# Patient Record
Sex: Female | Born: 1945
Health system: Southern US, Community
[De-identification: ages and names within clinical notes are randomized; demographics above are authoritative.]

## PROBLEM LIST (undated history)

## (undated) DIAGNOSIS — K219 Gastro-esophageal reflux disease without esophagitis: Secondary | ICD-10-CM

## (undated) DIAGNOSIS — I10 Essential (primary) hypertension: Secondary | ICD-10-CM

## (undated) DIAGNOSIS — L0390 Cellulitis, unspecified: Secondary | ICD-10-CM

## (undated) DIAGNOSIS — E785 Hyperlipidemia, unspecified: Secondary | ICD-10-CM

## (undated) DIAGNOSIS — I2510 Atherosclerotic heart disease of native coronary artery without angina pectoris: Secondary | ICD-10-CM

## (undated) DIAGNOSIS — F1910 Other psychoactive substance abuse, uncomplicated: Secondary | ICD-10-CM

## (undated) DIAGNOSIS — A310 Pulmonary mycobacterial infection: Secondary | ICD-10-CM

## (undated) DIAGNOSIS — E039 Hypothyroidism, unspecified: Secondary | ICD-10-CM

## (undated) DIAGNOSIS — M792 Neuralgia and neuritis, unspecified: Secondary | ICD-10-CM

## (undated) DIAGNOSIS — J449 Chronic obstructive pulmonary disease, unspecified: Secondary | ICD-10-CM

## (undated) DIAGNOSIS — E119 Type 2 diabetes mellitus without complications: Secondary | ICD-10-CM

## (undated) DIAGNOSIS — K5792 Diverticulitis of intestine, part unspecified, without perforation or abscess without bleeding: Secondary | ICD-10-CM

## (undated) DIAGNOSIS — Z8619 Personal history of other infectious and parasitic diseases: Secondary | ICD-10-CM

## (undated) HISTORY — DX: Essential (primary) hypertension: I10

## (undated) HISTORY — DX: Cellulitis, unspecified: L03.90

## (undated) HISTORY — DX: Pulmonary mycobacterial infection: A31.0

## (undated) HISTORY — DX: Atherosclerotic heart disease of native coronary artery without angina pectoris: I25.10

## (undated) HISTORY — DX: Type 2 diabetes mellitus without complications: E11.9

## (undated) HISTORY — DX: Chronic obstructive pulmonary disease, unspecified: J44.9

## (undated) HISTORY — PX: COLON SURGERY: SHX602

## (undated) HISTORY — DX: Hyperlipidemia, unspecified: E78.5

## (undated) HISTORY — PX: HAND SURGERY: SHX662

## (undated) HISTORY — DX: Personal history of other infectious and parasitic diseases: Z86.19

## (undated) HISTORY — DX: Other psychoactive substance abuse, uncomplicated: F19.10

## (undated) HISTORY — DX: Gastro-esophageal reflux disease without esophagitis: K21.9

## (undated) HISTORY — DX: Diverticulitis of intestine, part unspecified, without perforation or abscess without bleeding: K57.92

---

## 1998-10-16 ENCOUNTER — Encounter: Admission: RE | Admit: 1998-10-16 | Discharge: 1999-01-14 | Payer: Self-pay | Admitting: Family Medicine

## 1998-11-18 ENCOUNTER — Encounter: Payer: Self-pay | Admitting: Gastroenterology

## 1998-11-18 ENCOUNTER — Ambulatory Visit (HOSPITAL_COMMUNITY): Admission: RE | Admit: 1998-11-18 | Discharge: 1998-11-18 | Payer: Self-pay | Admitting: Gastroenterology

## 2000-03-18 ENCOUNTER — Encounter: Admission: RE | Admit: 2000-03-18 | Discharge: 2000-03-18 | Payer: Self-pay | Admitting: Family Medicine

## 2000-03-18 ENCOUNTER — Encounter: Payer: Self-pay | Admitting: Family Medicine

## 2000-04-13 ENCOUNTER — Encounter: Payer: Self-pay | Admitting: Internal Medicine

## 2001-12-14 ENCOUNTER — Encounter: Payer: Self-pay | Admitting: Thoracic Surgery (Cardiothoracic Vascular Surgery)

## 2001-12-15 ENCOUNTER — Inpatient Hospital Stay (HOSPITAL_COMMUNITY): Admission: AD | Admit: 2001-12-15 | Discharge: 2001-12-23 | Payer: Self-pay | Admitting: Cardiology

## 2001-12-15 ENCOUNTER — Encounter: Payer: Self-pay | Admitting: Thoracic Surgery (Cardiothoracic Vascular Surgery)

## 2001-12-16 ENCOUNTER — Encounter: Payer: Self-pay | Admitting: Thoracic Surgery (Cardiothoracic Vascular Surgery)

## 2001-12-17 ENCOUNTER — Encounter: Payer: Self-pay | Admitting: Thoracic Surgery (Cardiothoracic Vascular Surgery)

## 2001-12-18 ENCOUNTER — Encounter: Payer: Self-pay | Admitting: Thoracic Surgery (Cardiothoracic Vascular Surgery)

## 2001-12-19 ENCOUNTER — Encounter: Payer: Self-pay | Admitting: Thoracic Surgery (Cardiothoracic Vascular Surgery)

## 2001-12-22 ENCOUNTER — Encounter: Payer: Self-pay | Admitting: Thoracic Surgery (Cardiothoracic Vascular Surgery)

## 2002-01-25 HISTORY — PX: CORONARY ARTERY BYPASS GRAFT: SHX141

## 2003-12-23 ENCOUNTER — Ambulatory Visit: Payer: Self-pay | Admitting: Cardiology

## 2004-07-31 ENCOUNTER — Ambulatory Visit: Payer: Self-pay | Admitting: Cardiology

## 2004-12-25 ENCOUNTER — Ambulatory Visit: Payer: Self-pay | Admitting: Cardiology

## 2005-04-28 ENCOUNTER — Ambulatory Visit: Payer: Self-pay | Admitting: Internal Medicine

## 2005-12-28 ENCOUNTER — Ambulatory Visit: Payer: Self-pay | Admitting: Cardiology

## 2006-01-06 ENCOUNTER — Ambulatory Visit: Payer: Self-pay

## 2006-02-04 ENCOUNTER — Ambulatory Visit: Payer: Self-pay | Admitting: Internal Medicine

## 2006-02-11 ENCOUNTER — Ambulatory Visit: Payer: Self-pay | Admitting: Internal Medicine

## 2006-03-18 ENCOUNTER — Ambulatory Visit: Payer: Self-pay | Admitting: Internal Medicine

## 2006-11-16 ENCOUNTER — Ambulatory Visit: Payer: Self-pay | Admitting: Critical Care Medicine

## 2007-01-12 ENCOUNTER — Ambulatory Visit: Payer: Self-pay | Admitting: Cardiology

## 2007-01-31 ENCOUNTER — Ambulatory Visit: Payer: Self-pay

## 2007-07-03 ENCOUNTER — Ambulatory Visit: Payer: Self-pay | Admitting: Internal Medicine

## 2007-07-03 DIAGNOSIS — R059 Cough, unspecified: Secondary | ICD-10-CM | POA: Insufficient documentation

## 2007-07-03 DIAGNOSIS — I2510 Atherosclerotic heart disease of native coronary artery without angina pectoris: Secondary | ICD-10-CM

## 2007-07-03 DIAGNOSIS — E785 Hyperlipidemia, unspecified: Secondary | ICD-10-CM

## 2007-07-03 DIAGNOSIS — I10 Essential (primary) hypertension: Secondary | ICD-10-CM | POA: Insufficient documentation

## 2007-07-03 DIAGNOSIS — J479 Bronchiectasis, uncomplicated: Secondary | ICD-10-CM | POA: Insufficient documentation

## 2007-07-03 DIAGNOSIS — R05 Cough: Secondary | ICD-10-CM

## 2007-07-03 DIAGNOSIS — Z8679 Personal history of other diseases of the circulatory system: Secondary | ICD-10-CM | POA: Insufficient documentation

## 2007-07-03 DIAGNOSIS — Z8719 Personal history of other diseases of the digestive system: Secondary | ICD-10-CM

## 2007-07-06 ENCOUNTER — Telehealth (INDEPENDENT_AMBULATORY_CARE_PROVIDER_SITE_OTHER): Payer: Self-pay | Admitting: *Deleted

## 2007-07-18 ENCOUNTER — Encounter: Payer: Self-pay | Admitting: Internal Medicine

## 2007-07-27 ENCOUNTER — Ambulatory Visit: Payer: Self-pay | Admitting: Internal Medicine

## 2007-07-27 LAB — CONVERTED CEMR LAB
BUN: 13 mg/dL (ref 6–23)
Basophils Absolute: 0 10*3/uL (ref 0.0–0.1)
Basophils Relative: 0.1 % (ref 0.0–1.0)
CO2: 32 meq/L (ref 19–32)
Calcium: 9.2 mg/dL (ref 8.4–10.5)
Chloride: 101 meq/L (ref 96–112)
Creatinine, Ser: 1.1 mg/dL (ref 0.4–1.2)
Eosinophils Absolute: 0.1 10*3/uL (ref 0.0–0.7)
Eosinophils Relative: 1.4 % (ref 0.0–5.0)
GFR calc Af Amer: 64 mL/min
GFR calc non Af Amer: 53 mL/min
Glucose, Bld: 123 mg/dL — ABNORMAL HIGH (ref 70–99)
HCT: 39.9 % (ref 36.0–46.0)
Hemoglobin: 13.7 g/dL (ref 12.0–15.0)
Lymphocytes Relative: 16.8 % (ref 12.0–46.0)
MCHC: 34.4 g/dL (ref 30.0–36.0)
MCV: 94 fL (ref 78.0–100.0)
Monocytes Absolute: 0.5 10*3/uL (ref 0.1–1.0)
Monocytes Relative: 7.2 % (ref 3.0–12.0)
Neutro Abs: 5.2 10*3/uL (ref 1.4–7.7)
Neutrophils Relative %: 74.5 % (ref 43.0–77.0)
Platelets: 335 10*3/uL (ref 150–400)
Potassium: 3.6 meq/L (ref 3.5–5.1)
RBC: 4.24 M/uL (ref 3.87–5.11)
RDW: 11.5 % (ref 11.5–14.6)
Sed Rate: 41 mm/hr — ABNORMAL HIGH (ref 0–22)
Sodium: 141 meq/L (ref 135–145)
WBC: 7 10*3/uL (ref 4.5–10.5)

## 2007-08-03 ENCOUNTER — Ambulatory Visit: Payer: Self-pay | Admitting: Cardiology

## 2007-08-08 ENCOUNTER — Ambulatory Visit: Payer: Self-pay | Admitting: Internal Medicine

## 2007-08-08 ENCOUNTER — Encounter: Payer: Self-pay | Admitting: Internal Medicine

## 2007-08-08 ENCOUNTER — Ambulatory Visit: Admission: RE | Admit: 2007-08-08 | Discharge: 2007-08-08 | Payer: Self-pay | Admitting: Internal Medicine

## 2007-08-10 ENCOUNTER — Telehealth: Payer: Self-pay | Admitting: Internal Medicine

## 2007-08-14 ENCOUNTER — Telehealth (INDEPENDENT_AMBULATORY_CARE_PROVIDER_SITE_OTHER): Payer: Self-pay | Admitting: *Deleted

## 2007-08-29 ENCOUNTER — Ambulatory Visit: Payer: Self-pay | Admitting: Internal Medicine

## 2007-09-01 ENCOUNTER — Telehealth (INDEPENDENT_AMBULATORY_CARE_PROVIDER_SITE_OTHER): Payer: Self-pay | Admitting: *Deleted

## 2007-09-06 ENCOUNTER — Encounter: Payer: Self-pay | Admitting: Internal Medicine

## 2007-09-07 ENCOUNTER — Encounter: Payer: Self-pay | Admitting: Internal Medicine

## 2007-10-19 ENCOUNTER — Ambulatory Visit: Payer: Self-pay | Admitting: Internal Medicine

## 2007-10-20 LAB — CONVERTED CEMR LAB
AST: 35 units/L (ref 0–37)
Alkaline Phosphatase: 88 units/L (ref 39–117)
BUN: 20 mg/dL (ref 6–23)
Basophils Absolute: 0 10*3/uL (ref 0.0–0.1)
Bilirubin, Direct: 0.2 mg/dL (ref 0.0–0.3)
Chloride: 101 meq/L (ref 96–112)
Eosinophils Absolute: 0.1 10*3/uL (ref 0.0–0.7)
Eosinophils Relative: 2.1 % (ref 0.0–5.0)
GFR calc Af Amer: 58 mL/min
GFR calc non Af Amer: 48 mL/min
MCV: 92.3 fL (ref 78.0–100.0)
Neutrophils Relative %: 70 % (ref 43.0–77.0)
Platelets: 322 10*3/uL (ref 150–400)
Potassium: 4.3 meq/L (ref 3.5–5.1)
RDW: 13.6 % (ref 11.5–14.6)
Sodium: 143 meq/L (ref 135–145)
Total Bilirubin: 0.7 mg/dL (ref 0.3–1.2)
WBC: 5.4 10*3/uL (ref 4.5–10.5)

## 2007-12-07 ENCOUNTER — Ambulatory Visit: Payer: Self-pay | Admitting: Internal Medicine

## 2008-01-01 ENCOUNTER — Ambulatory Visit: Payer: Self-pay | Admitting: Internal Medicine

## 2008-01-04 ENCOUNTER — Ambulatory Visit: Payer: Self-pay | Admitting: Cardiology

## 2008-01-11 ENCOUNTER — Encounter: Payer: Self-pay | Admitting: Internal Medicine

## 2008-08-09 ENCOUNTER — Encounter: Payer: Self-pay | Admitting: Cardiology

## 2008-08-19 ENCOUNTER — Encounter: Admission: RE | Admit: 2008-08-19 | Discharge: 2008-08-19 | Payer: Self-pay | Admitting: Family Medicine

## 2008-09-06 ENCOUNTER — Ambulatory Visit: Payer: Self-pay | Admitting: Internal Medicine

## 2008-10-22 ENCOUNTER — Encounter (INDEPENDENT_AMBULATORY_CARE_PROVIDER_SITE_OTHER): Payer: Self-pay | Admitting: *Deleted

## 2009-01-03 ENCOUNTER — Ambulatory Visit: Payer: Self-pay | Admitting: Cardiology

## 2009-01-20 ENCOUNTER — Telehealth (INDEPENDENT_AMBULATORY_CARE_PROVIDER_SITE_OTHER): Payer: Self-pay | Admitting: *Deleted

## 2009-01-21 ENCOUNTER — Ambulatory Visit: Payer: Self-pay

## 2009-01-21 ENCOUNTER — Encounter (HOSPITAL_COMMUNITY): Admission: RE | Admit: 2009-01-21 | Discharge: 2009-03-31 | Payer: Self-pay | Admitting: Cardiology

## 2009-01-21 ENCOUNTER — Ambulatory Visit: Payer: Self-pay | Admitting: Cardiovascular Disease

## 2009-02-07 ENCOUNTER — Encounter: Payer: Self-pay | Admitting: Cardiology

## 2009-11-19 ENCOUNTER — Ambulatory Visit: Payer: Self-pay | Admitting: Internal Medicine

## 2009-11-19 DIAGNOSIS — A310 Pulmonary mycobacterial infection: Secondary | ICD-10-CM

## 2009-11-20 ENCOUNTER — Telehealth (INDEPENDENT_AMBULATORY_CARE_PROVIDER_SITE_OTHER): Payer: Self-pay | Admitting: *Deleted

## 2010-01-06 ENCOUNTER — Encounter: Payer: Self-pay | Admitting: Cardiology

## 2010-01-06 ENCOUNTER — Ambulatory Visit: Payer: Self-pay | Admitting: Cardiology

## 2010-01-21 ENCOUNTER — Encounter: Payer: Self-pay | Admitting: Cardiology

## 2010-01-30 ENCOUNTER — Encounter: Payer: Self-pay | Admitting: Cardiology

## 2010-01-30 ENCOUNTER — Ambulatory Visit: Admission: RE | Admit: 2010-01-30 | Discharge: 2010-01-30 | Payer: Self-pay | Source: Home / Self Care

## 2010-02-26 NOTE — Assessment & Plan Note (Signed)
Summary: Pulomonary/ ext ov with hfa teaching @ 75% effective   Primary Provider/Referring Provider:  Dr. Ace Gins  CC:  Cough- retrned after d/.c abx in July- prod with clear sputum.Marland Kitchen  History of Present Illness: 52  yowf quit smoking 1972  with documented right middle lobe syndrome and evidence of bronchiectasis by CT scan in March 2002,   08/08/07  FOB with classic cobblestoning and MAI on culture.  08/15/07  given Levaquin x 10 days with resolution  bloody mucus, but "felt she had flu the whole time" with aches, feverish  August 29, 2007  ov:  first post bronch co still coughing up mucus clear and initiate rx with symbicort/ [facility]  October 19, 2007 ov no cough , sob, feeling great but no improvement on cxr   December 07, 2007 ov feeling great, minimal am cough not productive.  No sob. Opth eval, labs ok 10/09   September 06, 2008  ov overall better over the last year, less tendency to exac on zmax and ethambutol. rec complete another year > satisfied improved 90%  November 19, 2009 ov Cough- retrned after d/.c abx in July- prod with clear sputum.  breathing ok and no nocturnal co's or early am exac.  Pt denies any significant sore throat, dysphagia, itching, sneezing,  nasal congestion or excess secretions,  fever, chills, sweats, unintended wt loss, pleuritic or exertional cp, hempoptysis, change in activity tolerance  orthopnea pnd or leg swelling. Pt also denies any obvious fluctuation in symptoms with weather or environmental change or other alleviating or aggravating factors.        Current Medications (verified): 1)  Toprol Xl 50 Mg  Tb24 (Metoprolol Succinate) .... Once Daily 2)  Synthroid 75 Mcg  Tabs (Levothyroxine Sodium) .... Once Daily 3)  Amitriptyline Hcl 75 Mg  Tabs (Amitriptyline Hcl) .... At Bedtime 4)  Carisoprodol 350 Mg  Tabs (Carisoprodol) .... Once Daily 5)  Lipitor 80 Mg Tabs (Atorvastatin Calcium) .... Take One Tablet By Mouth  Daily. 6)  Diovan 160 Mg  Tabs (Valsartan) .... Once Daily 7)  Adult Aspirin Ec Low Strength 81 Mg  Tbec (Aspirin) .... Once Daily 8)  Oxygen 2.5 Liters .... At Bedtime 9)  Multivitamins   Caps (Multiple Vitamin) .... Once Daily 10)  Symbicort 80-4.5 Mcg/act  Aero (Budesonide-Formoterol Fumarate) .... Two Puffs Am and Pm Pt Only Taking 1 Puff Every Am 11)  Sm Calcium Antacid Ultra St 1000 Mg Chew (Calcium Carbonate Antacid) .... Tab By Mouth Once Daily 12)  Vitamin D .... 1 Tab By Mouth Once Daily 13)  Coq10 Maximum Strength 400 Mg Caps (Coenzyme Q10) .Marland Kitchen.. 1 Tab By Mouth Once Daily 14)  Omega-3 350 Mg Caps (Omega-3 Fatty Acids) .Marland Kitchen.. 1 Tab By Mouth Once Daily 15)  Mucinex 600 Mg Xr12h-Tab (Guaifenesin) .Marland Kitchen.. 1 Once Daily  Allergies (verified): 1)  ! * Tussinex 2)  Erythromycin 3)  Codeine  Past History:  Past Medical History: Bronchiectasis see CT SE 04/13/00     - HFA  75% November 19, 2009  MAI   - Rx Zmax and [facility] 08/29/07 > 08/2009   - Eye eval scheduled 10/09...............................................[name].......................Marland KitchenStone    - Td  10/09    - Pneumovax 2005 second shot  CAD Hyperlipidemia Hypertension History of cough with ACE inhibition. Gastroesophageal reflux disease  Vital Signs:  Patient profile:   65 year old female Weight:      139 pounds BMI:  25.52 O2 Sat:      96 % on Room air Temp:     98.0 degrees F oral Pulse rate:   83 / minute BP sitting:   132 / 74  (left arm)  Vitals Entered By: Vernie Murders (November 19, 2009 9:16 AM)  O2 Flow:  Room air  Physical Exam  Additional Exam:  In  general she is a pleasant  ambulatory white female in no acute distress.  Wt 130 October 19, 2007  .> 136  December 07, 2007  > 134 September 07, 2008  > 139 November 19, 2009  HEENT: nl dentition, turbinates, and orophanx. Nl external ear canals without cough reflex Neck without JVD/Nodes/TM Lungs minimal  exp late  rhonchi  bilaterally  with a few insp pops and sqeaks as well RRR no s3 or murmur or increase in P2 Abd soft and benign with nl excursion in the supine position. No bruits or organomegaly Ext warm without calf tenderness, cyanosis clubbing or edema Skin warm and dry without lesions     CXR  Procedure date:  11/19/2009  Findings:      Scarring and volume loss in the right middle lobe and lingula.  No acute findings.  Impression & Recommendations:  Problem # 1:  BRONCHIECTASIS (ICD-494.0) I spent extra time with the patient today explaining optimal mdi  technique.  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  See instructions for specific recommendations   Orders: Est. Patient Level IV (40347)  Problem # 3:  GASTROESOPHAGEAL REFLUX DISEASE, HX OF (ICD-V12.79)  See instructions for specific recommendations   Orders: Est. Patient Level IV (42595)  Medications Added to Medication List This Visit: 1)  Symbicort 80-4.5 Mcg/act Aero (Budesonide-formoterol fumarate) .... Two puffs am and pm pt only taking 1 puff every am 2)  Symbicort 80-4.5 Mcg/act Aero (Budesonide-formoterol fumarate) .... One twice daily 3)  Mucinex 600 Mg Xr12h-tab (Guaifenesin) .Marland Kitchen.. 1 once daily 4)  Levaquin 750 Mg Tabs (Levofloxacin) .... One tablet by mouth daily x 5 days  Other Orders: T-2 View CXR (71020TC) Prescription Created Electronically [phone])  Patient Instructions: 1)  Levaquin x 750 mg x 5 day cycles for worsening cough/ congestion or  discolored sputum 2)  Work on perfecting inhaler technique:  relax and blow all the way out then take a nice smooth deep breath back in, triggering the inhaler at same time you start breathing in  3)  GERD (REFLUX)  is a common cause of respiratory symptoms. It commonly presents without heartburn and can be treated with medication, but also with lifestyle changes including avoidance of late meals, excessive alcohol, smoking cessation, and avoid fatty foods, chocolate, peppermint, colas, red wine, and acidic juices such as orange juice. NO MINT OR MENTHOL PRODUCTS SO NO COUGH DROPS  4)  USE SUGARLESS CANDY INSTEAD (jolley ranchers)  5)  NO OIL BASED VITAMINS (no fish oil) 6)  Return to office in 3 months, sooner if needed  Prescriptions: LEVAQUIN 750 MG  TABS (LEVOFLOXACIN) One tablet by mouth  daily x 5 days  #5 x 11   Entered and Authorized by:   Nyoka Cowden MD   Signed by:   Nyoka Cowden MD on 11/19/2009   Method used:   Electronically to        [facility]. [phone]* (retail)       [phone]       [place], [place]  [address]       Ph: [phone]       Fax: [phone]   RxID:   [id number] SYMBICORT 80-4.5 MCG/ACT  AERO (BUDESONIDE-FORMOTEROL FUMARATE) one twice daily  #1 x 11   Entered and Authorized by:   Nyoka Cowden MD   Signed by:   Nyoka Cowden MD on 11/19/2009   Method used:   Electronically to        [facility]. [phone]* (retail)       [phone] [address]       [place], [place]  [address]       Ph: [phone]       Fax: [phone]   RxID:   [id number]

## 2010-02-26 NOTE — Assessment & Plan Note (Signed)
Summary: 1 yr/dmiller  Medications Added METOPROLOL TARTRATE 25 MG TABS (METOPROLOL TARTRATE) Take one tablet by mouth twice a day        Primary Provider:  Dr. Ace Gins  CC:  sob.  History of Present Illness: Belinda Day is a very pleasant  female with a history of coronary artery disease status post coronary artery bypass graft in 2002.  Her last Myoview in Dec 2010, showed mild apical no scar or ischemia, and her ejection fraction was 77%. Carotid Dopplers performed in January of 2009 showed 0-39% stenosis bilaterally. I last saw her in December of 2010. Since then she continues to have dyspnea on exertion most likely secondary to her bronchiectasis. It is relieved with rest. It is not associated with chest pain. There is no orthopnea, P. and B., pedal edema, palpitations, syncope or exertional chest pain.   Current Medications (verified): 1)  Toprol Xl 50 Mg  Tb24 (Metoprolol Succinate) .... Once Daily 2)  Synthroid 75 Mcg  Tabs (Levothyroxine Sodium) .... Once Daily 3)  Amitriptyline Hcl 75 Mg  Tabs (Amitriptyline Hcl) .... At Bedtime 4)  Carisoprodol 350 Mg  Tabs (Carisoprodol) .... Once Daily 5)  Lipitor 80 Mg Tabs (Atorvastatin Calcium) .... Take One Tablet By Mouth Daily. 6)  Diovan 160 Mg  Tabs (Valsartan) .... Once Daily 7)  Adult Aspirin Ec Low Strength 81 Mg  Tbec (Aspirin) .... Once Daily 8)  Oxygen 2.5 Liters .... At Bedtime 9)  Multivitamins   Caps (Multiple Vitamin) .... Once Daily 10)  Symbicort 80-4.5 Mcg/act  Aero (Budesonide-Formoterol Fumarate) .... One Twice Daily 11)  Sm Calcium Antacid Ultra St 1000 Mg Chew (Calcium Carbonate Antacid) .... Tab By Mouth Once Daily 12)  Vitamin D .... 1 Tab By Mouth Once Daily 13)  Coq10 Maximum Strength 400 Mg Caps (Coenzyme Q10) .Marland Kitchen.. 1 Tab By Mouth Once Daily 14)  Mucinex 600 Mg Xr12h-Tab (Guaifenesin) .Marland Kitchen.. 1 Once Daily  Allergies: 1)  ! * Tussinex 2)  Erythromycin 3)  Codeine  Past History:  Past Medical  History: Reviewed history from 11/19/2009 and no changes required. Bronchiectasis see CT SE 04/13/00     - HFA  75% November 19, 2009  MAI   - Rx Zmax and [facility]/29/07 > 08/2009   - Eye eval scheduled 10/09............................................[name].......................Marland KitchenStone    - Td  10/09    - Pneumovax 2005 second shot  CAD Hyperlipidemia Hypertension History of cough with ACE inhibition. Gastroesophageal reflux disease  Past Surgical History: Reviewed history from 10/19/2007 and no changes required. CABG 2002   - Myoview 01/06/2006 EF 78%  Social History: Reviewed history from 01/02/2009 and no changes required. Quit smoking 1972 Alcohol Use - no  Review of Systems       no fevers or chills, productive cough, hemoptysis, dysphasia, odynophagia, melena, hematochezia, dysuria, hematuria, rash, seizure activity, orthopnea, PND, pedal edema, claudication. Remaining systems are negative.   Vital Signs:  Patient profile:   65 year old female Height:      62 inches Weight:      139 pounds BMI:     25.52 Pulse rate:   90 / minute Resp:     14 per minute BP sitting:   144 / 85  (left arm)  Vitals Entered By: Kem Parkinson (January 06, 2010 9:40 AM)  Physical Exam  General:  Well-developed well-nourished in no acute distress.  Skin is warm and dry.  HEENT is normal.  Neck is supple. No thyromegaly.  Chest is clear to auscultation with normal expansion.  Cardiovascular exam is regular rate and rhythm.  Abdominal exam nontender or distended. No masses palpated. Extremities show no edema. neuro grossly intact    EKG  Procedure date:  01/06/2010  Findings:      Sinus rhythm with nonspecific ST changes.  Impression & Recommendations:  Problem # 1:  CAD (ICD-414.00) Continue aspirin and statin. Change Toprol to Lopressor secondary to reimbursement issues. Her updated medication list for this  problem includes:    Toprol Xl 50 Mg Tb24 (Metoprolol succinate) ..... Once daily    Adult Aspirin Ec Low Strength 81 Mg Tbec (Aspirin) ..... Once daily  Her updated medication list for this problem includes:    Toprol Xl 50 Mg Tb24 (Metoprolol succinate) ..... Once daily    Adult Aspirin Ec Low Strength 81 Mg Tbec (Aspirin) ..... Once daily  Problem # 2:  HYPERLIPIDEMIA (ICD-272.4)  Continue statin. Lipids and liver monitored by primary care. Her updated medication list for this problem includes:    Lipitor 80 Mg Tabs (Atorvastatin calcium) .Marland Kitchen... Take one tablet by mouth daily.  Her updated medication list for this problem includes:    Lipitor 80 Mg Tabs (Atorvastatin calcium) .Marland Kitchen... Take one tablet by mouth daily.  Problem # 3:  HYPERTENSION (ICD-401.9)  Blood pressure mildly elevated. She will follow this and if it remains high we will increase her Diovan. Potassium and renal function monitored by primary care. Her updated medication list for this problem includes:    Metoprolol Tartrate 25 Mg Tabs (Metoprolol tartrate) .Marland Kitchen... Take one tablet by mouth twice a day    Diovan 160 Mg Tabs (Valsartan) ..... Once daily    Adult Aspirin Ec Low Strength 81 Mg Tbec (Aspirin) ..... Once daily  Her updated medication list for this problem includes:    Metoprolol Tartrate 25 Mg Tabs (Metoprolol tartrate) .Marland Kitchen... Take one tablet by mouth twice a day    Diovan 160 Mg Tabs (Valsartan) ..... Once daily    Adult Aspirin Ec Low Strength 81 Mg Tbec (Aspirin) ..... Once daily  Problem # 4:  BRONCHIECTASIS (ICD-494.0)  Problem # 5:  CEREBROVASCULAR DISEASE, HX OF (ICD-V12.50) Continue aspirin and statin. Schedule followup carotid Dopplers. Orders: Carotid Duplex (Carotid Duplex)  Patient Instructions: 1)  Your physician has recommended you make the following change in your medication: STOP TOPROL 2)  START METOPROLOL TART 25MG  ONR TABLET TWICE DAILY 3)  Your physician wants you to follow-up in: ONE  YEAR  You will receive a reminder letter in the mail two months in advance. If you don't receive a letter, please call our office to schedule the follow-up appointment. 4)  Your physician has requested that you have a carotid duplex. This test is an ultrasound of the carotid arteries in your neck. It looks at blood flow through these arteries that supply the brain with blood. Allow one hour for this exam. There are no restrictions or special instructions. Prescriptions: METOPROLOL TARTRATE 25 MG TABS (METOPROLOL TARTRATE) Take one tablet by mouth twice a day  #60 x 12   Entered by:   Deliah Goody, RN   Authorized by:   Ferman Hamming, MD, [facility]   Signed by:   Deliah Goody, RN on 01/06/2010   Method used:   Electronically to        [facility]. [phone]* (retail)       [phone] [address].       [facility]  [place], [place]  [address]       Ph: [phone]       Fax: [phone]   RxID:   [id number]    Vital Signs:  Patient profile:   65 year old female Height:      62 inches Weight:      139 pounds BMI:     25.52 Pulse rate:   90 / minute Resp:     14 per minute BP sitting:   144 / 85  (left arm)  Vitals Entered By: Kem Parkinson (January 06, 2010 9:40 AM)

## 2010-02-26 NOTE — Progress Notes (Signed)
Summary: results  Phone Note Call from Patient   Caller: Patient Call For: wert Summary of Call: xray results Initial call taken by: Rickard Patience,  November 20, 2009 11:40 AM  Follow-up for Phone Call        Spoke wth pt and notified of cxr results per MW append. Follow-up by: Vernie Murders,  November 20, 2009 11:52 AM

## 2010-03-30 ENCOUNTER — Ambulatory Visit (INDEPENDENT_AMBULATORY_CARE_PROVIDER_SITE_OTHER): Payer: Medicare Other | Admitting: Internal Medicine

## 2010-03-30 ENCOUNTER — Encounter: Payer: Self-pay | Admitting: Internal Medicine

## 2010-03-30 DIAGNOSIS — Z79899 Other long term (current) drug therapy: Secondary | ICD-10-CM

## 2010-03-30 DIAGNOSIS — I10 Essential (primary) hypertension: Secondary | ICD-10-CM

## 2010-03-30 DIAGNOSIS — E039 Hypothyroidism, unspecified: Secondary | ICD-10-CM

## 2010-03-30 DIAGNOSIS — E785 Hyperlipidemia, unspecified: Secondary | ICD-10-CM

## 2010-03-31 ENCOUNTER — Ambulatory Visit (INDEPENDENT_AMBULATORY_CARE_PROVIDER_SITE_OTHER)
Admission: RE | Admit: 2010-03-31 | Discharge: 2010-03-31 | Disposition: A | Discharge status: Home / Self Care | Payer: Medicare Other | Source: Ambulatory Visit | Attending: Internal Medicine | Admitting: Internal Medicine

## 2010-03-31 ENCOUNTER — Ambulatory Visit (INDEPENDENT_AMBULATORY_CARE_PROVIDER_SITE_OTHER): Payer: Medicare Other | Admitting: Internal Medicine

## 2010-03-31 ENCOUNTER — Encounter: Payer: Self-pay | Admitting: Internal Medicine

## 2010-03-31 ENCOUNTER — Other Ambulatory Visit: Payer: Self-pay | Admitting: Internal Medicine

## 2010-03-31 DIAGNOSIS — A310 Pulmonary mycobacterial infection: Secondary | ICD-10-CM

## 2010-04-06 ENCOUNTER — Encounter: Payer: Self-pay | Admitting: Internal Medicine

## 2010-04-06 DIAGNOSIS — E039 Hypothyroidism, unspecified: Secondary | ICD-10-CM | POA: Insufficient documentation

## 2010-04-06 NOTE — Assessment & Plan Note (Signed)
Normotensive and stable. Continue current regimen. 

## 2010-04-06 NOTE — Assessment & Plan Note (Signed)
Stable. Tolerates statin tx. Flp/lft 12/11.

## 2010-04-06 NOTE — Progress Notes (Signed)
  Subjective:    Patient ID: Belinda Day, female    DOB: 01-12-1946, 65 y.o.   MRN: [id number]  HPI Pt presents to clinic to est primary care. Known h/o CAD and carotid stenosis followed by cardiology for surveillance. H/o bronchiectasis reviewed with past MAC infxn. Tolerates statin therapy without myalgias or abn lft. Reviewed nl TFT's 12/11 with h/o hypothyroidism BP under good control. Has osteopenia with no recent fx hx and bmd utd 10/10 with nl vit d 12/11. States has fibromyalgia and h/o +ANA with rheumatology appt pending.   Reviewed PMH, psh, medications, allergies, soc hx and fam hx.    Review of Systems  Constitutional: Negative for fever and fatigue.  HENT: Negative for congestion, facial swelling and rhinorrhea.   Eyes: Negative for discharge and redness.  Respiratory: Negative for shortness of breath and wheezing.   Cardiovascular: Negative for chest pain and palpitations.  Gastrointestinal: Negative for abdominal pain and blood in stool.  Genitourinary: Negative for decreased urine volume and difficulty urinating.  Musculoskeletal: Positive for myalgias. Negative for back pain and joint swelling.  Skin: Negative for pallor and rash.  Neurological: Negative for dizziness, seizures, syncope and numbness.  Hematological: Negative for adenopathy. Does not bruise/bleed easily.  Psychiatric/Behavioral: Negative for behavioral problems and agitation.       Objective:   Physical Exam  Nursing note and vitals reviewed. Constitutional: She appears well-developed and well-nourished. No distress.  HENT:  Head: Normocephalic and atraumatic.  Right Ear: External ear normal.  Left Ear: External ear normal.  Nose: Nose normal.  Mouth/Throat: Oropharynx is clear and moist. No oropharyngeal exudate.  Eyes: Conjunctivae are normal. Right eye exhibits no discharge. Left eye exhibits no discharge. No scleral icterus.  Neck: Neck supple. No JVD present. No thyromegaly present.    Cardiovascular: Normal rate, regular rhythm and normal heart sounds.  Exam reveals no gallop and no friction rub.   No murmur heard. Pulmonary/Chest: Effort normal and breath sounds normal. No respiratory distress. She has no wheezes.  Abdominal: Bowel sounds are normal. There is no tenderness.  Lymphadenopathy:    She has no cervical adenopathy.  Neurological: She is alert.  Skin: Skin is warm and dry. She is not diaphoretic.  Psychiatric: She has a normal mood and affect.          Assessment & Plan:

## 2010-04-06 NOTE — Assessment & Plan Note (Signed)
Stable. Asx. Continue current dose of medication. Obtain tsh/ft4 with next visit.

## 2010-04-07 NOTE — Assessment & Plan Note (Addendum)
Summary: Pulmonary/ f/u eval with cxr, try cipro cycles   Copy to:  Dr. Charlynn Court Primary Provider/Referring Provider:  Dr. Charlynn Court  CC:  Cough- the same.  History of Present Illness: 60  yowf quit smoking 1972  with documented right middle lobe syndrome and evidence of bronchiectasis by CT scan in March 2002,   08/08/07  FOB with classic cobblestoning and MAI on culture.  08/15/07  given Levaquin x 10 days with resolution  bloody mucus, but "felt she had flu the whole time" with aches, feverish  August 29, 2007  ov:  first post bronch co still coughing up mucus clear and initiate rx with symbicort/ [facility]  October 19, 2007 ov no cough , sob, feeling great but no improvement on cxr   December 07, 2007 ov feeling great, minimal am cough not productive.  No sob. Opth eval, labs ok 10/09   September 06, 2008  ov overall better over the last year, less tendency to exac on zmax and ethambutol. rec complete another year > satisfied improved 90% and stopped zmax and eth  08/2009  November 19, 2009 ov Cough- retrned after d/.c abx in July- prod with clear sputum.  breathing ok and no nocturnal co's or early am exac. Levaquin x 750 mg x 5 day cycles for worsening cough/ congestion or discolored sputum Work on perfecting inhaler technique: and continue symbicort one twice because too shaky on 2bid  March 31, 2010 ov cough worse levaquin not able to tol and didn't help like zmax and ethambutol did and wants to restart.  mucus minimally discolored, worse in am, no sign sob. Pt denies any significant sore throat, dysphagia, itching, sneezing,  nasal congestion or excess secretions,  fever, chills, sweats, unintended wt loss, pleuritic or exertional cp, hempoptysis, change in activity tolerance  orthopnea pnd or leg swelling   Current Medications (verified): 1)  Metoprolol Tartrate 25 Mg Tabs (Metoprolol Tartrate) .... Take One Tablet By Mouth Twice A Day 2)  Synthroid 75  Mcg  Tabs (Levothyroxine Sodium) .... Once Daily 3)  Amitriptyline Hcl 75 Mg  Tabs (Amitriptyline Hcl) .... At Bedtime 4)  Carisoprodol 350 Mg  Tabs (Carisoprodol) .... Once Daily 5)  Lipitor 80 Mg Tabs (Atorvastatin Calcium) .... Take One Tablet By Mouth Daily. 6)  Diovan 160 Mg  Tabs (Valsartan) .... Once Daily 7)  Adult Aspirin Ec Low Strength 81 Mg  Tbec (Aspirin) .... Once Daily 8)  Oxygen 2.5 Liters .... At Bedtime 9)  Multivitamins   Caps (Multiple Vitamin) .... Once Daily 10)  Symbicort 80-4.5 Mcg/act  Aero (Budesonide-Formoterol Fumarate) .... One Twice Daily 11)  Sm Calcium Antacid Ultra St 1000 Mg Chew (Calcium Carbonate Antacid) .... Tab By Mouth Once Daily 12)  Vitamin D 1000 Unit Tabs (Cholecalciferol) .... 3 Once Daily 13)  Co Q-10 200 Mg Caps (Coenzyme Q10) .Marland Kitchen.. 1 Once Daily 14)  Mucinex 600 Mg Xr12h-Tab (Guaifenesin) .Marland Kitchen.. 1 Once Daily 15)  Tramadol Hcl 50 Mg Tabs (Tramadol Hcl) .Marland Kitchen.. 1 Every 6 Hrs As Needed  Allergies (verified): 1)  ! * Tussinex 2)  ! Levaquin 3)  Erythromycin 4)  Codeine  Past History:  Past Medical History: Bronchiectasis see CT SE 04/13/00     - HFA  75% November 19, 2009  MAI   - Rx Zmax and [facility] 08/29/07 > 08/2009    - Eye eval scheduled 10/09...............................................[name]   - Intol of levaquin so try cycles of cipro April 02, 2010 >>> HEALTH MAINTENANCE.......................................................[facility]land [facility]    - Td  10/09    - Pneumovax 2005 second shot  CAD Hyperlipidemia Hypertension History of cough with ACE inhibition. Gastroesophageal reflux disease   Vital Signs:  Patient profile:   65 year old female Weight:      140 pounds O2 Sat:      100 % on Room air Temp:     97.9 degrees F oral Pulse rate:   73 / minute BP sitting:   110 / 68  (left arm)  Vitals Entered By: Vernie Murders (March 31, 2010 1:31 PM)  O2 Flow:  Room air  Physical Exam  Additional Exam:  In  general she is a pleasant   ambulatory white female in no acute distress.  Wt 130 October 19, 2007  .> 136  December 07, 2007  > 134 September 07, 2008  > 139 November 19, 2009 > 140 March 31, 2010  HEENT: nl dentition, turbinates, and orophanx. Nl external ear canals without cough reflex Neck without JVD/Nodes/TM Lungs minimal exp late  rhonchi  bilaterally  with a few insp pops and sqeaks as well RRR no s3 or murmur or increase in P2 Abd soft and benign with nl excursion in the supine position. No bruits or organomegaly Ext warm without calf tenderness, cyanosis clubbing or edema Skin warm and dry without lesions     CXR  Procedure date:  03/31/2010  Findings:      IMPRESSION: Stable chronic changes with no worrisome focal or acute abnormality suggested.  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  See instructions for specific recommendations.  If not able to tolerate cipro or it's not effective, ok to resume chronic zmax and eth with intent to treat at least another year if not 3 this time  Medications Added to Medication List This Visit: 1)  Vitamin D 1000 Unit Tabs (Cholecalciferol) .... 3 once daily 2)  Co Q-10 200 Mg Caps (Coenzyme q10) .Marland Kitchen.. 1 once daily 3)  Tramadol Hcl 50 Mg Tabs (Tramadol hcl) .Marland Kitchen.. 1 every 6 hrs as needed 4)  Cipro 750 Mg Tabs (Ciprofloxacin hcl) .... One by mouth twice daily  Other Orders: Prescription Created Electronically [phone]) T-2 View CXR (71020TC) Est. Patient Level IV (32440)  Patient Instructions: 1)  Cipro 750 one twice daily for 7 days for cough flare 2)  Call if not better to  restart zmax and ehtambutal. 3)  Return to office in 3 months, sooner if needed  Prescriptions: CIPRO 750 MG  TABS (CIPROFLOXACIN HCL) One by mouth twice daily  #14 x 11   Entered and Authorized by:   Nyoka Cowden MD   Signed by:   Nyoka Cowden MD on 03/31/2010   Method used:   Electronically to        [facility]. [phone]* (retail)       [phone]       [place], [place]  [address]       Ph: [phone]  Fax: [phone]   RxID:   [id number]

## 2010-06-09 NOTE — Assessment & Plan Note (Signed)
HEALTHCARE                            CARDIOLOGY OFFICE NOTE   NAME:Belinda Day, Belinda Day                      MRN:          [id number]  DATE:01/12/2007                            DOB:          08-10-1945    The patient is a very pleasant 65 year old female who has a history of  coronary artery disease.  She is status post coronary artery bypass and  graft in 2002.  Her most recent Myoview was on January 06, 2006.  There  was mild apical thinning, but there was no scar or ischemia, and her  ejection fraction was 78%.  She also has a history of mild  cerebrovascular disease by carotid Dopplers in 2005.  Since I last saw  her, she is doing well from a cardiac standpoint.  She has not had chest  pain or shortness of breath and there is no pedal edema.  There is no  syncope.  She did have some problems with palpitations in August.  She  was seen by Dr. Smith Mince and an electrocardiogram, apparently, was  normal.  Her Fosamax was discontinued and these have improved.  There  are described as a skip.   MEDICATIONS:  1. Toprol 50 mg p.o. daily.  2. Synthroid 0.075 mg p.o. daily.  3. Amitriptyline 75 mg p.o. daily.  4. Carisoprodol 350 mg p.o. daily.  5. Diovan 160 mg p.o. daily.  6. Multivitamin.  7. Calcium.  8. Vitamin B complex.  9. Aspirin 81 mg p.o. daily.  10.Home oxygen at night.  11.Lipitor 40 mg p.o. daily.   PHYSICAL EXAMINATION:  Shows a blood pressure of 134/76 and the pulse is  68.  She weighs 127 pounds.  Her HEENT is normal.  Her neck is supple with no bruits.  Her chest is clear.  Her cardiovascular exam reveals a regular rate and rhythm.  Her abdominal exam shows no pulsatile masses and no bruits.  Her extremities show no edema.   An electrocardiogram today shows a sinus rhythm at a rate of 64.  There  are nonspecific ST changes.   DIAGNOSES:  1. Coronary artery disease, status post coronary artery bypass and      graft:  The  patient has had no chest pain or shortness of breath.      Her recent Myoview showed no ischemia.  We will continue with      medical therapy including her aspirin, beta blocker, angiotensin      receptor blocker, and statin.  2. Recent palpitations:  These sound to be premature ventricular      contractions based on her description.  If they worsen, then we can      consider a CardioNet monitor in the future.  3. History of mild cerebrovascular disease:  We will plan to repeat      her carotid Dopplers.  4. Hypertension:  Her blood pressure is adequately controlled on her      present medications.  5. Hyperlipidemia:  I will have her most recent lipids and liver      forwarded to [place] for our records.  Our goal LDL should be less than      70 given her coronary disease.  6. Bronchiectasis/right middle lobe syndrome:  Per pulmonary.  7. History of gastroesophageal reflux disease.  8. History of cough with an angiotensin-converting enzyme inhibition.   She will see [place] back in 12 months.  She will continue with risk factor  modification.     Madolyn Frieze Jens Som, MD, [facility]  Electronically Signed    BSC/MedQ  DD: 01/12/2007  DT: 01/12/2007  Job #: [id number]   cc:   Talmadge Coventry, M.D.

## 2010-06-09 NOTE — Assessment & Plan Note (Signed)
[facility]                            CARDIOLOGY OFFICE NOTE   NAME:Belinda Day                      MRN:          [id number]  DATE:01/04/2008                            DOB:          1945-02-04    [name] is a very pleasant 65 year old female with a history of  coronary artery disease status post coronary artery bypass graft in  2002.  Her last Myoview on January 06, 2006, showed mild apical  thinning, but no scar ischemia, and her ejection fraction was 78%.  Since I last saw her, she is doing well from symptomatic standpoint.  She does have some dyspnea on exertion, but she has right middle lobe  syndrome and MAI which is being treated.  There is no orthopnea, PND,  pedal edema, palpitations, presyncope, syncope, or exertional chest  pain.   MEDICATIONS:  1. Ethambutol.  2. Azithromycin.  3. Symbicort.  4. Nasacort.  5. Oxygen at night.  6. Synthroid.  7. Amitriptyline.  8. Lipitor 40 mg p.o. daily.  9. Toprol 50 mg p.o. daily.  10.Diovan 160 mg p.o. daily.  11.Aspirin 81 mg p.o. daily.   PHYSICAL EXAMINATION:  VITAL SIGNS:  Today shows a blood pressure of  126/74 and her pulse is 78.  She weighs 140 pounds.  HEENT:  Normal.  NECK:  Supple.  CHEST:  Clear.  CARDIOVASCULAR:  Regular rate and rhythm.  ABDOMINAL:  No tenderness.  EXTREMITIES:  No edema.   Her electrocardiogram shows sinus rhythm at a rate of 74.  There are  nonspecific ST changes.   DIAGNOSES:  1. Coronary artery disease status post coronary artery bypass graft -      Belinda Day is doing well from symptomatic standpoint with no chest      pain or shortness of breath.  She will continue on her aspirin,      beta-blocker, ARB, and statin.  When I see her back in 1 year, we      will most likely repeat her Myoview at that time.  2. History of mild cerebrovascular disease - she did have followup      carotid Dopplers in January 2009, with 0-39% bilateral  stenosis.      We will not pursue this further, but she will continue on her      aspirin and statin.  3. Hypertension - blood pressure is adequately controlled on present      medications.  4. Hyperlipidemia - she will continue on her statin.  I will have most      recent laboratory results forwarded to [place] from Dr. Stephannie Peters      office.  5. Bronchiectasis/right middle lobe syndrome.  6. History of gastroesophageal reflux disease.  7. History of cough with ACE inhibition.   I will see her back in 12 months.     Madolyn Frieze Jens Som, MD, [facility]  Electronically Signed    BSC/MedQ  DD: 01/04/2008  DT: 01/04/2008  Job #: [id number]   cc:   Talmadge Coventry, M.D.

## 2010-06-09 NOTE — Assessment & Plan Note (Signed)
[facility]                             PULMONARY OFFICE NOTE   NAME:Belinda Day, Belinda Day                      MRN:          [id number]  DATE:11/16/2006                            DOB:          16-Dec-1945    HISTORY OF PRESENT ILLNESS:  The patient is a 65 year old female patient  of Dr. Sherene Sires who has a known history of right middle lobe syndrome with  bronchiectasis who presents today for followup.  Patient reports that  she has been doing exceptionally well without any flare of symptoms.  She denies any increased cough, purulent sputum, fever, chest pain,  orthopnea, PND, or leg swelling.  Patient is maintained on nocturnal  oxygen with an overnight oximetry showing mild desaturations previously.  Patient is requesting that this be discontinued due to her extensive  expenses with her oxygen therapy.  Patient's insurance deductible is  greater than 2000 dollars, and she has been having to pay quite a bit of  out of pocket costs recently.   PAST MEDICAL HISTORY:  Reviewed.   CURRENT MEDICATIONS:  Reviewed.   PHYSICAL EXAMINATION:  Patient is a pleasant female in no acute  distress.  She is afebrile with stable vital signs.  Her O2 saturation is 98% on  room air.  HEENT:  Unremarkable.  NECK:  Supple without cervical adenopathy.  No JVD.  LUNG SOUNDS:  Reveal a few scattered rhonchi without any wheezing or  crackles.  CARDIAC:  Regular rate.  ABDOMEN:  Soft and nontender.  EXTREMITIES:  Warm without any edema.   IMPRESSION AND PLAN:  Right middle lobe syndrome with bronchiectasis.  Patient is currently well compensated with mild nocturnal desaturation.  Patient is doing well presently.  Continue on her present regimen.  We  will repeat an overnight oximetry, and contact [facility] to  discuss possible options of her oxygen out of pocket cost.  We will  follow up on overnight oximetry with Dr. Sherene Sires on followup in 2 to 3  weeks.      [name], NP  Electronically Signed      Charlaine Dalton. Sherene Sires, MD, [facility]  Electronically Signed   TP/MedQ  DD: 11/17/2006  DT: 11/18/2006  Job #: [id number]

## 2010-06-09 NOTE — Op Note (Signed)
NAMESUHANI, STILLION               ACCOUNT NO.:  [id number]   MEDICAL RECORD NO.:  [id number]          PATIENT TYPE:  AMB   LOCATION:  CARD                         FACILITY:  [facility]   PHYSICIAN:  Casimiro Needle B. Sherene Sires, MD, FCCPDATE OF BIRTH:  1945-07-12   DATE OF PROCEDURE:  DATE OF DISCHARGE:                               OPERATIVE REPORT   REFERRING PHYSICIAN:  Talmadge Coventry, M.D.   HISTORY AND INDICATIONS:  Please see attached office records.  There has  been no change in the history or exam since the office evaluation was  completed on July 27, 2007.  Bronchoscopy was felt to be indicated to  obtain a biopsy of the right lower lobe cavitary process which is  consistent with MAI clinically, and the patient agreed to procedure  after a full discussion of the risks, benefits and alternatives.   The procedure was performed in the bronchoscopy suite with continuous  monitoring by surface EC and oximetry.  The patient maintained adequate  saturations throughout the procedure on nasal oxygen and received a  total of 5 mg of IV Versed and 50 mg of IV Demerol for added sedation  and cough suppression.   The right naris and oropharynx were liberally anesthetized with 1%  lidocaine by updraft nebulizer and the right naris additionally prepared  with 2% lidocaine jelly.   Using a standard flexible Fiberoptic bronchoscope, the right naris was  easily cannulated with good visualization of the entire oropharynx and  larynx.  The cords moved normally and there were no apparent upper  airway lesions.   Using additional 1% lidocaine as needed, the entire tracheobronchial  tree was explored with the following the findings.  The trachea, carina  and all major airways opened widely at the subsegmental level.  The  major findings were as follows:   There was diffuse severe cobblestoning in the right airways that was not  present on the left.  However, there were no focal endobronchial   lesions.   PROCEDURE:  1. Initially, I did one single biopsy of the right lower lobe cavity      and felt that there was adequate tissue obtained.  2. Washings were also obtained throughout the right-sided airways.  3. Endobronchial biopsy of the cobblestoning was obtained x3 with      adequate tissue obtained also.   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   RECOMMENDATIONS:  Await tissue confirmation and cultures.   The patient tolerated the procedure well with a follow-up chest x-ray  showing no evidence of pneumothorax.  Follow-up will be arranged as an  outpatient.      Charlaine Dalton. Sherene Sires, MD, [facility]  Electronically Signed     MBW/MEDQ  D:  08/08/2007  T:  08/08/2007  Job:  [id number]   cc:   Talmadge Coventry, M.D.  Fax: [phone]

## 2010-06-12 NOTE — Discharge Summary (Signed)
NAME:  Belinda Day, Belinda Day                         ACCOUNT NO.:  [id number]   MEDICAL RECORD NO.:  [id number]                   PATIENT TYPE:  INP   LOCATION:  2011                                 FACILITY:  [facility]   PHYSICIAN:  Salvatore Decent. Dorris Fetch, M.D.         DATE OF BIRTH:  1945-09-08   DATE OF ADMISSION:  12/14/2001  DATE OF DISCHARGE:  12/22/2001                                 DISCHARGE SUMMARY   PRIMARY ADMITTING DIAGNOSIS:  Chest pain.   ADDITIONAL/DISCHARGE DIAGNOSES:  1. Severe two-vessel coronary artery disease.  2. Hyperlipidemia.  3. Hypothyroidism.  4. Depression.  5. Hypoglycemia.  6. Gastroesophageal reflux disease.  7. Right middle lobe syndrome.  8. Fibromyalgia.   PROCEDURES PERFORMED:  1. Cardiac catheterization.  2. Off-pump coronary artery bypass grafting x3 (left internal mammary artery     to the LAD, saphenous vein graft to the diagonal, free right internal     mammary artery to the right coronary artery).   HISTORY:  The patient is a 65 year old white female with a history of  hyperlipidemia and palpitations which have been going on for about a year  now.  She previously had a normal Cardiolite which was done August 2002.  In  the past several months, she has noticed some exertional chest pressure  which radiates to her shoulders and back.  She was seen and evaluated by Dr.  Jens Som, and was brought in on December 05, 2001 for outpatient cardiac  catheterization.  This showed an ejection fraction of 60% with severe two-  vessel coronary artery disease.  She was not felt to be a good candidate for  percutaneous intervention.  Therefore, cardiothoracic surgery consultation  was obtained, and she was admitted for evaluation.   HOSPITAL COURSE:  She was seen by Dr. Dorris Fetch, and it was felt that she  would be a good candidate for surgical revascularization.  She was taken to  the operating room on December 15, 2001 and underwent off-pump coronary  artery bypass grafting x3 with the above-noted grafts.  She tolerated the  procedure well and was transferred to the SICU in stable condition.  She was  extubated shortly after surgery.  Postoperatively she has done well.  She  remained in the SICU until postoperative day #2, and at that time was  transferred to the floor.  She has had some gastrointestinal problems  postoperatively, most specifically a lot of nausea in the immediate  postoperative period, and later on some dysphagia.  She has been treated  with Protonix and Reglan, and seems to be doing well.  The nausea is  improving.  She was seen by gastroenterology while in house, and a barium  swallow has been ordered for Friday, December 22, 2001, to evaluate her  dysphagia.  Otherwise, she has done well.  She has been ambulating in the  halls without difficulty.  She has been weaned off supplemental oxygen and  maintaining  O2 saturations of greater than 90% on room air.  She has been  diuresed back down to within 4-5 pounds of her preoperative weight.  Her  labs have remained stable.  She is tolerating a soft diet and having normal  bowel and bladder function.  As previously mentioned, she will undergo a  barium swallow on the morning of December 22, 2001, and if this is stable  and shows no significant abnormalities, she will be ready for discharge  home.    DISCHARGE MEDICATIONS:  1. Enteric-coated aspirin 325 mg every day.  2. Toprol-XL 25 mg every day.  3. Protonix 40 mg every day.  4. Amitriptyline 75 mg every day.  5. Zocor 20 mg every day.  6. Miacalcin nasal spray daily.  7. Advair 100/50 two puffs b.i.d.  8. Synthroid 0.05 mg every day.  9. Ultram 50 to 100 mg q.3-4h. p.r.n. for pain.   DISCHARGE INSTRUCTIONS:  She is to refrain from driving, heavy lifting, or  strenuous activity.  She may continue a low-fat, low-sodium diet.  She is  asked to shower daily and clean her incisions with soap and water.   DISCHARGE  FOLLOWUP:  She will see Dr. Jens Som back in the [facility] office in 2 weeks, and have a chest x-ray at that time.  She will  then follow up with Dr. Dorris Fetch in 3 weeks, and is asked to bring the  chest x-ray to this appointment.  She is asked to follow up with  gastroenterology as previously scheduled for colonoscopy and for  reevaluation of her dysphagia.     Coral Ceo, P.A.                        Salvatore Decent Dorris Fetch, M.D.    GC/MEDQ  D:  12/21/2001  T:  12/22/2001  Job:  [id number]   cc:   Olga Millers, M.D. [facility]

## 2010-06-12 NOTE — Consult Note (Signed)
NAME:  Belinda Day, Belinda Day                         ACCOUNT NO.:  [id number]   MEDICAL RECORD NO.:  [id number]                   PATIENT TYPE:  INP   LOCATION:  2011                                 FACILITY:  [facility]   PHYSICIAN:  Petra Kuba, M.D.                 DATE OF BIRTH:  1945-03-27   DATE OF CONSULTATION:  DATE OF DISCHARGE:                                   CONSULTATION   HISTORY OF PRESENT ILLNESS:  The patient well known to my partner, Dr.  Luther Parody, followed mostly for some lower bowel complaints and is due for a  colonic screening.  Unfortunately, was found to have some coronary artery  disease, underwent CABG by Dr. Dorris Fetch and is having some postop  dysphagia. It seems to be just for solids.  She swallows soft solids fine.  She has had some clearing of her voice issues lately, was started on some  Prilosec by Dr. Sherene Sires and that has seemed to help.  In our chart in the  office, she may have said she occasionally had some swallowing problems, but  has not has had any previous work-up or plans.   PAST MEDICAL HISTORY:  Pertinent for increased cholesterol, hyperthyroidism,  depression, some bronchiectasis.   ALLERGIES:  Erythromycin and codeine.   CURRENT MEDICATIONS:  Advair, aspirin, Dulcolax, Protonix, Synthroid,  Elavil, Zocor, Soma, Colace, Toprol, Lasix, __________ and Ultram.   FAMILY HISTORY:  Negative for any swallowing problems.   REVIEW OF SYSTEMS:  Again, is swallowing liquids and soft solids fine.   PHYSICAL EXAMINATION:  GENERAL:  No acute distress.  Patient looks good  postop.  VITAL SIGNS:  Stable, afebrile.  NECK:  Without obvious adenopathy.  LUNGS:  Clear.  HEART:  Regular rate and rhythm.  ABDOMEN:  Soft, nontender.   ASSESSMENT:  Mild solid food dysphagia in a patient with a recent coronary  artery bypass graft.   PLAN:  She plans to go home Friday or Saturday.  We will get a barium  swallow with a barium tablet on Friday.  Continue  Protonix or Prilosec at  home.  Follow up with Dr. Luther Parody as an outpatient to review the barium  swallow, barium tablet and discuss endoscopy with or without dilation.  I  briefly discussed that with her.  At this time she is due for her colon  screening but she would probably like to hold that off until she gets over  the surgery.  We will need Dr. Sunday Corn input on when she is okay to  proceed with the above.  Call us this weekend if any questions or problems.  I discussed all the above with the patient and the family and they are in  agreement with that plan.  Petra Kuba, M.D.    MEM/MEDQ  D:  12/20/2001  T:  12/21/2001  Job:  [id number]   cc:   Charlaine Dalton. Sherene Sires, M.D. [facility]  [address]  [place]  [place] [address]  Fax: [phone]   Althea Grimmer. Luther Parody, M.D.  [address], [address]  [place]  [place] [address]  Fax: [phone]   Talmadge Coventry, M.D.  [address]  [place]  [place] [address]  Fax: [phone]   Salvatore Decent. Dorris Fetch, M.D.  [address]  [place]  [place] [address]  Fax: [phone]

## 2010-06-12 NOTE — Consult Note (Signed)
NAME:  Belinda Day, Belinda Day                         ACCOUNT NO.:  [id number]   MEDICAL RECORD NO.:  [id number]                   PATIENT TYPE:  OIB   LOCATION:  2038                                 FACILITY:  [facility]   PHYSICIAN:  Charlaine Dalton. Sherene Sires, M.D. [facility]           DATE OF BIRTH:  1945/08/03   DATE OF CONSULTATION:  12/14/2001  DATE OF DISCHARGE:                                   CONSULTATION   HISTORY:  Ms. Kehm is a 65 year old white female former smoker seen at the  request of Dr. Dorris Fetch today for preoperative evaluation.  She has had  several different patterns of chest pain but one in particular worrisome for  angina pectoris dating back over a year and ultimately underwent cardiac  catheterization indicating that she does have coronary artery disease and is  scheduled for CABG tomorrow morning.  The patient has minimal sputum  production, mostly in the morning, typically only minimally discolored but  documented bronchiectasis by CT scanning.  CT scanning is also suggestive of  multiple pulmonary nodules but concentrated mostly in the right middle lobe  and I had contemplated previously performing bronchoscopy but not until her  cardiac evaluation was complete.  She denies any recent change in her  pulmonary symptoms.  Her exertion presently is limited by chest discomfort,  especially if she goes up hills or gets in a hurry, but she denies any  nocturnal wheeze, orthopnea, PND, change in morning sputum production,  fevers, chills, sweats, or pleuritic pain of any kind, or any unintended  weight loss, or history of hemoptysis or active sinus complaints.   PAST MEDICAL HISTORY:  Operations:  No major operations.  Major medical illnesses:  Hyperlipidemia, hypothyroidism.  Allergies:  Erythromycin and codeine caused GI complaints.  She denies any  aspirin intolerance.  Medications:  Presently, she is maintained on amitriptyline 75 mg q.d.,  Zocor 20 mg q.d., aspirin 81 mg daily,  Miacalcin nasal spray 1 q.h.s.,  Lopressor 25 mg 1 q.a.m., Synthroid 75 mcg per day, Advair 100/50 1 b.i.d.,  and Prilosec 1 daily.   SOCIAL HISTORY:  She quit smoking over 30 years ago.  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  She denies any unusual travel,  pet, or hobby exposure.   FAMILY HISTORY:  Negative for respiratory diseases.  Coronary artery disease  in her mother.   REVIEW OF SYMPTOMS:  Taken in detail essentially negative except as noted  above.   PHYSICAL EXAMINATION:  This is a slightly anxious but quite pleasant white  female in no acute distress sitting on a stretcher at 30 degrees with no  increased work of breathing after left heart catheterization.  HEENT:  Unremarkable.  Pharynx clear.  Nasal turbinates normal.  Dentition intact.  Neck supple without cervical adenopathy.  Lung fields reveal minimal rhonchi  over the right middle lobe over the right versus the left.  There is regular  rhythm without murmur, gallop, or rub present.  Abdomen soft, benign.  Extremities:  No calf tenderness, no cyanosis, clubbing or edema.   Spirometry reviewed from the patient on November 21, 2001, in our office  indicated an FEV1 of 1.56 which was 73% predicted with a ratio of 64% and  FVC corrected at 28% predicted.  Chest x-ray revealed parenchymal scarring  and bronchiectasis in the right middle lobe and perhaps also in the lingula  dated October 23, 2001, and most recent CT scan was reviewed from April 13, 2001, indicating bronchiectatic changes mostly involving the right  middle lobe with also innumerable small reticular nodular opacities in the  periphery of both lung fields.    IMPRESSION:  1. Bronchiectasis relatively well isolated with the only manifestation being     minimal discoloration of morning sputum chronically.  She has only very     mild airflow obstruction that is well compensated on Advair 100/50 b.i.d.     and I do not recommend  anything be done preoperatively other than to     continue nebulizers to promote better mucous airway function and airway     dynamics.  I should note that she has not been treated with any steroids     recently nor any recent antibiotic therapy.  2. Multiple pulmonary nodules are probably inflammatory in nature and may     suggest the possibility of colonization with Mycobacterium avium.  Based     on the minimal symptoms that she has, however, including the absence of     any systemic infection, I do not recommend aggressive treatment here and     would simply have a watch and wait approach feeling that the most     important issue here is to proceed with coronary artery bypass grafting     and we will be happy to see her perioperatively if needed.  However, I     believe she is a relatively low risk.                                               Charlaine Dalton. Sherene Sires, M.D. [facility]    MBW/MEDQ  D:  12/14/2001  T:  12/14/2001  Job:  [id number]   cc:   Talmadge Coventry, M.D.  [address]  [place]  [place] [address]  Fax: [phone]   Salvatore Decent. Dorris Fetch, M.D.  [address]  [place]  [place] [address]  Fax: [phone]

## 2010-06-12 NOTE — Cardiovascular Report (Signed)
NAME:  Belinda Day, Belinda Day                         ACCOUNT NO.:  [id number]   MEDICAL RECORD NO.:  [id number]                   PATIENT TYPE:  OIB   LOCATION:  2899                                 FACILITY:  [facility]   PHYSICIAN:  Charlies Constable, M.D. [facility]              DATE OF BIRTH:  1945-12-23   DATE OF PROCEDURE:  12/14/2001  DATE OF DISCHARGE:                              CARDIAC CATHETERIZATION   PROCEDURE PERFORMED:  Cardiac catheterization and percutaneous coronary  intervention.   CLINICAL HISTORY:  The patient is 65 years old and has hyperlipidemia, but  has otherwise been healthy.  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 Sherene Sires for this.  She has had exertional chest pain for  some time, and she was seen recently by Dr. Jens Som and he arranged for  evaluation with angiography.   DESCRIPTION OF PROCEDURE:  The procedure was performed via the right femoral  artery using an arterial sheath and 6 French preformed coronary catheters.  A front wall arterial puncture was performed and Omnipaque contrast was  used. A subclavian injection was performed to assess the internal mammary  artery for its suitability for bypass grafting.  A distal aortogram was  performed to rule out abdominal aortic aneurysm.  The patient tolerated the  procedure well and left the laboratory in satisfactory condition.   RESULTS:  The left main coronary artery:  The left main coronary artery was  free of significant disease.   Left anterior descending:  The left anterior descending artery gave rise to  a large septal perforator, a large diagonal branch and two small diagonal  branches.  There was a long 90% stenosis in the proximal to mid vessel and  there was a 70% focal narrowing in the mid vessel.   Circumflex artery:  The circumflex artery gave rise to a marginal branch and  AV branch.  These vessels were free of significant disease.   Right coronary artery:  The right coronary  had a 99% stenosis proximally  with TIMI-1 flow distally and diffuse disease throughout the mid vessel.  This was a functionally totally occluded vessel.  The distal vessel filled  via collaterals from the LAD and circumflex artery and consisted of a  posterior descending and three small posterolateral branches.   LEFT VENTRICULOGRAPHY:  The left ventriculogram performed in the RAO  projection showed good wall motion with no area hypokinesis.  The estimated  ejection fraction was 50%.   A subclavian injection showed patent vertebral artery and patent internal  mammary artery and no subclavian stenosis.   ABDOMINAL AORTOGRAM:  A distal aortogram was performed which showed patent  renal arteries and no significant aortoiliac obstruction.   The aortic pressure was 158/86 with a mean of 18, left ventricular pressure  was 158/31.   CONCLUSION:  Severe two-vessel coronary artery disease with 90% proximal and  70% mid  stenosis in the left anterior descending artery, no major  obstruction in the circumflex artery, and 99% stenosis in the right coronary  artery with functional total occlusion and TIMI-1 flow and normal left  ventricular function.   RECOMMENDATIONS:  Both lesions are not very favorable for percutaneous  intervention, and I think the patient could best be treated with bypass  surgery.  A surgical consultation will be obtained.                                                    Charlies Constable, M.D. [facility]    BB/MEDQ  D:  12/14/2001  T:  12/14/2001  Job:  [id number]   cc:   Talmadge Coventry, M.D.  [address]  [place]  [place] [address]  Fax: [phone]   Olga Millers, M.D. [facility]   Cardiopulmonary Laboratory

## 2010-06-12 NOTE — Op Note (Signed)
   NAME:  Belinda Day, Belinda Day                         ACCOUNT NO.:  [id number]   MEDICAL RECORD NO.:  [id number]                   PATIENT TYPE:  OIB   LOCATION:  2899                                 FACILITY:  [facility]   PHYSICIAN:  Charlaine Dalton. Sherene Sires, M.D. [facility]           DATE OF BIRTH:  1945-05-24   DATE OF PROCEDURE:  DATE OF DISCHARGE:                                 OPERATIVE REPORT   No dictation for this job number.                                               Charlaine Dalton. Sherene Sires, M.D. [facility]    MBW/MEDQ  D:  12/14/2001  T:  12/14/2001  Job:  [id number]

## 2010-06-12 NOTE — Consult Note (Signed)
NAME:  Belinda Day, Belinda Day                         ACCOUNT NO.:  [id number]   MEDICAL RECORD NO.:  [id number]                   PATIENT TYPE:  OIB   LOCATION:  2899                                 FACILITY:  [facility]   PHYSICIAN:  Salvatore Decent. Dorris Fetch, M.D.         DATE OF BIRTH:  08-20-1945   DATE OF CONSULTATION:  12/14/2001  DATE OF DISCHARGE:                                   CONSULTATION   REASON FOR CONSULTATION:  Severe two vessel coronary disease.   CHIEF COMPLAINT:  Exertional chest discomfort.   HISTORY OF PRESENT ILLNESS:  The patient is a 65 year old lady who does not  have any history of known coronary disease.  She does have a history of  hyperlipidemia and was having palpitations about a year ago.  She had a  normal Cardiolite done in August of 2002.  Over the past couple of months,  she has noted exertional chest discomfort. This is substernal chest pressure  that does radiate to her shoulders and back.  There is some mild shortness  of breath, occasional nausea and diaphoresis.  The pain improves with rest.  There has been no nocturnal or rest symptoms.  She was seen and evaluated by  Dr. Olga Millers and it is elected to proceed with catheterization for  evaluation of her chest pain that was performed today and revealed 90% small  segment LAD stenosis and subtotal occlusion of the right coronary artery.  Her ejection fraction was 60%.  There was no significant disease in the left  main or left circumflex.  The distal right circulation filled via left-to-  right collaterals.   PAST MEDICAL HISTORY:  This is significant for hyperlipidemia,  hypothyroidism, depression, right middle lobe syndrome, and hypoglycemia.  She did not have diabetes, stroke, TIA, DVT, or varicose veins.   MEDICATIONS:  1. Amitriptyline 75 mg p.o. q.d.  2. Carisoprodol 350 mg p.o. q.h.s.  3. Zocor 20 mg p.o. q.h.s.  4. Baby aspirin one daily.  5. Lopressor 25 mg p.o. b.i.d.  6. Synthroid  0.075 mg p.o. q.d.  7. Advair 100/50 two puffs b.i.d.  8. She has been taking Prilosec over-the-counter.   ALLERGIES:  She is allergic to ERYTHROMYCIN and has adverse side effects  with CODEINE.   FAMILY HISTORY:  This is positive for coronary disease.   SOCIAL HISTORY:  She is a nonsmoker and does not drink on a regular basis.   REVIEW OF SYSTEMS:  She does have cough with a hemoptysis.  She has  occasionally had wheezing but her symptoms have been well controlled from  respiratory standpoint and she has recently been examined by Dr. Sherene Sires and  felt to be doing well with her right middle lobe syndrome.  She does not  have any claudication, no history of abnormal bleeding or clotting.  All  other systems are negative.   PHYSICAL EXAMINATION:  GENERAL APPEARANCE:  The patient is a well-appearing  65 year old white female in no acute distress.  She is well-developed, well-  nourished.  VITAL SIGNS:  Blood pressure 121/68, pulse 69 and regular, respiratory rate  12.  HEENT:  Unremarkable.  NECK:  Supple with no thyromegaly, adenopathy or bruits.  LUNGS:  Clear to auscultation and percussion anteriorly bilaterally.  CARDIOVASCULAR:  Regular rate and rhythm.  Normal S1 and S2 with no murmurs,  rubs, or gallops.  ABDOMEN:  Soft and nontender.  EXTREMITIES:  She has 2+ dorsal pedis, posterior tibial, and radial pulses  bilaterally.  She has no varicosities and no peripheral edema.  SKIN:  Warm, pink and dry.  NEUROLOGIC:  She is alert and oriented x3 and grossly intact.   LABORATORY DATA:  PT is 10.6, PTT 24.  BUN and creatinine are 14 and 0.8.  Glucose is 107.  Sodium and potassium 139 and 3.9.  Hematocrit 44, platelets  335, white count 6.2.   IMPRESSION AND PLAN:  The patient is a 65 year old white female who presents  with exertional angina.  At catheterization she has severe two vessel  disease with septal occlusion of the right coronary artery. She has a long  segment left  anterior descending stenosis followed by a 70% mid left  anterior descending stenosis.  Coronary artery bypass grafting is indicated  for myocardial preservation, relief of symptoms.  She has preserved left  ventricular function and is a low risk candidate for surgery.   I discussed in detail with the patient the indications, risks, benefits, and  alternatives for treatment.  She understands that a median sternotomy will  be performed.  We will attempt to use bilateral mammary arteries.  We will  not plan to do anything about her right middle lobe syndrome due to the risk  of chronic bacterial colonization with increased risk of perioperative  infection.  We will possibly attempt off pump grafting depending on anatomic  variables.  We will examine that possibility in the operating room. She  understands the relative advantages or disadvantages of off pump grafting.   She understands the risks of surgery including but not limited to death,  stroke, MI, bleeding, possible need for transfusion, infections, as well as  other organ system dysfunction such as respiratory, renal, hepatic, GI, DVT,  and PE.  She understands and accepts these risks and agrees to proceed.  We  will plan to proceed with surgery first case tomorrow morning, December 15, 2001.  We will ask Dr. Sandrea Hughs to see and follow perioperatively for  management of her pulmonary status.                                               Salvatore Decent Dorris Fetch, M.D.    SCH/MEDQ  D:  12/14/2001  T:  12/14/2001  Job:  [id number]   cc:   Olga Millers, M.D. [facility]   Talmadge Coventry, M.D.  [address]  [place]  [place] [address]  Fax: [phone]   Charlaine Dalton. Sherene Sires, M.D. [facility]

## 2010-06-12 NOTE — Assessment & Plan Note (Signed)
[facility]                             PULMONARY OFFICE NOTE   NAME:Belinda Day, Belinda Day                      MRN:          [id number]  DATE:03/18/2006                            DOB:          March 30, 1945    PULMONARY/EXTENDED FOLLOWUP OFFICE VISIT:   HISTORY:  This 65 year old female with history of right middle lobe  syndrome and remote smoking history returns for followup chest x-ray and  PFTs, as requested.  On her last visit, she was having significant cough  that I attributed to postnasal drip syndrome and/or reflux, and  recommended that she maintain Prilosec daily and, if necessary, double  it to twice daily and contemplated stopping her Fosamax.  However, once  she started treating her rhinitis with Nasacort, the cough resolved and  she actually has been using the Prilosec on a p.r.n. basis.  She  denies any pleuritic pain, fevers, chills, sweats, orthopnea, PND or leg-  swelling.   For full list of her medications, please see face sheet, dated March 18, 2006.   PHYSICAL EXAM:  She is a pleasant, ambulatory, white female, in no acute  distress.  She had stable vital signs.  HEENT:  Reveals moderate turbinate edema with nonspecific features.  Oropharynx is clear no excessive postnasal drainage.  LUNG FIELDS:  Reveal more pseudo-wheeze than true wheeze that is  eliminated with purse-lip maneuver.  HEART:  Regular rate and rhythm without murmur, gallop or rub.  ABDOMEN:  Soft, benign.  EXTREMITIES:  Warm without calf tenderness, cyanosis, clubbing or edema.   The PFTs were reviewed with the patient in detail and include an FEV1 to  FVC ratio of 60% with diffusion capacity of 65% that corrects to normal.  The PFTs did not show improvement with bronchodilators.   Chest x-ray continues to show right middle lobe scarring.   IMPRESSION:  1. This patient has three separate problems.  First is that she has      chronic rhinitis that has  partially responded to Nasacort and most      likely represents the same mucociliary dysfunction syndrome that      affects her lungs.  She needs to continue to use her Nasacort and I      have reviewed with her optimal strategy for this purpose, to make      sure she understood that she can taper the Nasacort down to one      bedtime dose, but should not stop it.  In the event of acute      worsening of her sinus problems (elaborated on her chronic      rhinitis flyer), if two puffs b.i.d. with five days of Afrin first      does not solve her symptoms, then sinus CT scan needs to be      considered.  2. The right middle lobe syndrome is associated with air flow      obstruction that is mild to moderate in nature, but not reversible      with bronchodilators.  At this point, I do not recommend adding a  bronchodilator, unless she is having more frequent exacerbations or      limiting symptoms of dyspnea.  3. Much of her coughing is upper airway and related to postnasal drip      syndrome from rhinitis and/or reflux.  Because she is on Fosamax      and needs to take it chronically, I recommended she take the      Prilosec, as well, on a daily basis, with a low threshold,      increased to b.i.d. dosing, should she have increased cough.  I      realize this is somewhat of a controversial issue because daily      Prilosec may increase the risk of osteoporosis for which Fosamax is      being used to treat the active problem.  By trial and error,      however, every time the Prilosec is stopped, this patient tends to      have exacerbation of airways disease and so I think it makes sense      to use a baseline dose of 20 mg per day and then increase to b.i.d.      dosing for any exacerbation.   I reviewed each and every one of these issues with the patient in  detail, summarizing all the above studies, and arranged to see her back  every three months for followup, sooner if  needed.     Charlaine Dalton. Sherene Sires, MD, [facility]  Electronically Signed    MBW/MedQ  DD: 03/18/2006  DT: 03/18/2006  Job #: [id number]   cc:   Talmadge Coventry, M.D.

## 2010-06-12 NOTE — Assessment & Plan Note (Signed)
[facility]                            CARDIOLOGY OFFICE NOTE   NAME:Belinda Day, Belinda Day                      MRN:          [id number]  DATE:12/28/2005                            DOB:          Feb 28, 1945    The patient returns for followup today.  She is a very pleasant female  who has a history of coronary disease status post coronary artery  bypassing graft in 2002.  Since I last saw her she apparently has had  some difficulties with lung disease.  She does have a history of  bronchiectasis and has had pneumonia as well.  She does have some  dyspnea on exertion but there is no orthopnea, PND, pedal edema,  palpitations, presyncope, syncope, or chest pain.  She is exercising  routinely now, walking 20 minutes five times per week.  She does not  smoke.   MEDICATIONS:  1. Toprol-XL 50 mg p.o. daily.  2. Syncope 0.075 mg p.o. daily.  3. Fosamax 70 mg weekly.  4. Amitriptyline 75 mg p.o. daily.  5. Carisoprodol 350 mg p.o. daily.  6. Vytorin 10/40 q.h.s.  7. Diovan 160 mg p.o. daily.  8. Multivitamin, calcium, and vitamin B complex.  9. Prilosec.  10.Aspirin.  11.Oxygen.   PHYSICAL EXAMINATION TODAY:  Shows a blood pressure of 128/76 and her  pulse is 68.  She weighs 143 pounds.  NECK:  Supple and I cannot appreciate bruits.  CHEST:  Clear.  CARDIOVASCULAR:  Reveals a regular rate and rhythm.  ABDOMEN:  Shows no pulsatile masses and no bruits.  EXTREMITIES:  Showed no edema.   Her electrocardiogram shows a normal sinus rhythm at a rate of 68.  There are minor nonspecific ST changes.   DIAGNOSES:  1. Coronary artery disease status post coronary artery bypassing      graft.  2. History of mild cerebrovascular disease.  3. Hypertension.  4. Hyperlipidemia.  5. History of gastroesophageal reflux disease.  6. History of bronchiectasis.  7. History of cough with ACE inhibition.   PLAN:  Belinda Day is doing well from a symptomatic standpoint.   She does  have some dyspnea on exertion and it has been approximately 5 years  since her bypass.  We will plan to risk stratify with a stress Myoview.  If this shows no significant ischemia then we will continue with medical  therapy.  I will have her most recent lipids and liver forwarded to [place]  from Dr. Stephannie Peters office.  She will most likely need followup carotid  Dopplers in 1 year when I see her back.  She will continue with risk  factor modification today including diet and exercise.  She does not  smoke.     Madolyn Frieze Jens Som, MD, [facility]  Electronically Signed    BSC/MedQ  DD: 12/28/2005  DT: 12/28/2005  Job #: [id number]   cc:   Talmadge Coventry, M.D.

## 2010-06-12 NOTE — Op Note (Signed)
NAME:  Belinda Day, Belinda Day                         ACCOUNT NO.:  [id number]   MEDICAL RECORD NO.:  [id number]                   PATIENT TYPE:  INP   LOCATION:  2303                                 FACILITY:  [facility]   PHYSICIAN:  Salvatore Decent. Dorris Fetch, M.D.         DATE OF BIRTH:  1945-12-05   DATE OF PROCEDURE:  12/15/2001  DATE OF DISCHARGE:                                 OPERATIVE REPORT   PREOPERATIVE DIAGNOSIS:  Severe two-vessel disease with Class III angina.   POSTOPERATIVE DIAGNOSIS:  Severe two-vessel disease with Class III angina.   OPERATION PERFORMED:  1. Median sternotomy.  2. Off pump coronary artery bypass grafting (OPCAB)  times three (left     internal mammary artery to left anterior descending, free right internal     right mammary artery to distal right coronary and saphenous vein graft to     first diagonal).   SURGEON:  Salvatore Decent. Dorris Fetch, M.D.   ASSISTANT:  Eber Hong, P. A.   ANESTHESIA:  General.   FINDINGS:  Good quality conduits, distal branches of the right coronary are  very small and not individually graftable.   CLINICAL NOTE:  The patient is a 65 year old female with a history of right  middle lobe syndrome.  She presented with symptoms consistent with Class III  angina.  She had not had any rest or nocturnal symptoms.  At catheterization  she had severe two-vessel coronary disease with total occlusion of the right  coronary, 90% stenosis of the LAD followed by a 70% stenosis as well as a  50% ostial stenosis at the takeoff of a large first diagonal branch.  The  patient was referred for coronary artery bypass grafting, and the  indications, risks, benefits and alternative procedures were discussed in  detail with the patient.  She understood and accepted the risks, and agreed  to proceed.  We did discuss the potential relative dangers and disadvantages  of off pump coronary artery bypass grafting; the patient accepted the plan  for  attempt at off pump grafting with cardiopulmonary bypass and  cardioplegic arrest as a backup.   DESCRIPTION OF OPERATION:  The patient was brought to the preop holding area  on December 15, 2001.  Lines were placed to monitor arterial, central venous  and pulmonary arterial pressures.  EKG leads were placed for continuous  telemetry.  The patient was brought to the operating room, anesthetized and  intubated.  A Foley catheter was placed and intravenous antibiotics were  administered.  The chest, abdomen and legs were prepped and draped in the  usual fashion.   A median sternotomy was performed.  The left internal mammary artery was  harvested in a standard fashion.  Simultaneously an incision was made in the  medial aspect of the right leg at the ankle and a short segment of saphenous  vein was harvested from the right lower leg.  The saphenous vein  was of good  quality as was the left mammary artery.  Five-thousand units of heparin was  given prior to dividing distally onto the mammary artery and there was good  flow through the cut end of the vessel.   The right internal mammary artery was then harvested, again, using standard  technique.  All veins were doubly clipped and divided.  The mammary was left  attached proximally.  There was good flow through the distal end.   The remainder of the full heparin dose was given and adequate  anticoagulation was confirmed by the ACT and an additional 2,000 units of  heparin was given prior to proceeding with the off pump grafts.   The pericardium was opened.  The ascending aorta was inspected and palpated.  There was no palpable atherosclerotic disease.  The diagonal vessel was  inspected.  It did have visible plaque and some mild calcification  proximally.  It was an adequate distally.  The LAD and distal right coronary  also were good targets and sites were selected for grafting.  Of note, the  terminal branches of the distal right  coronary were all very small and would  be ungraftable individually in case there was a need for future procedures.  The conduits were inspected and cut to length.  The right mammary would not  reach the distal right coronary as a pedicle graft therefore the proximal  end was divided and the proximal stump was suture ligated with a 2-0 silk  suture.   The patient was placed in steep Trendelenburg position and rotated to the  right side.  Deep pericardial sutures were placed.  These were covered with  Rummel tourniquets to prevent epicardial laceration.  Retraction was placed  on the deep pericardial sutures and the apex of the heart prolapsed  anteriorly.  The patient tolerated this well hemodynamically.  She did not  have any hemodynamic compromise or ST wave changes during any of the distal  anastomoses.  The Gen[employer] stabilizer was placed over the first diagonal at  the site selected for grafting.  Silastic tapes were placed proximally and  distally.  An arteriotomy was performed.  It was a 1.5 mm good quality  target vessel.  The tapes were tightened.  There was good hemostasis.  The  end-to-side saphenous vein graft to the first diagonal anastomosis was  performed with a running 7-0 Prolene suture.  This anastomosis was probed  proximally and distally as were all the anastomoses at their completion.  The anastomosis was deaired.  The suture was tied.  There was excellent flow  through the graft and good hemostasis in the anastomosis.   Next, the stabilizer was placed over the distal right coronary.  Again,  silastic tapes were placed proximally and distally to the site of the  anastomosis.  The distal right coronary was a 1.5 mm good quality target.  The right mammary was a 1.5 mm good quality conduit.  The anastomosis was  performed with a running 8-0 Prolene suture.  Again, the anastomosis was probed proximally and distally.  After tying the suture the silastic tapes  were  released.  There was excellent backbleeding through the right mammary  graft via collaterals.  There was good hemostasis at the anastomosis.  The  mammary pedicle was tacked to the epicardial surface of the heart with 6-0  Prolene sutures.   Next, silastic tapes were placed proximally and distally with a site  selected for anastomosis on the distal  LAD.  The LAD was a 2 mm good quality  target.  The stabilizer was placed across the LAD.  The distal end of the  left mammary artery was spatulated.  An arteriotomy was made in the LAD.  The tapes were tightened.  There was good hemostasis, although there was  some backbleeding from a septal perforator.  This was easily controlled with  the polar mister.  The end-to-side anastomosis was performed with a running  8-0 Prolene suture.  Again, the anastomosis was probed proximally and  distally.  The Boley clamp was removed from the mammary artery.  There was  good hemostasis in the anastomosis.  The stabilizer was removed.   The deep pericardial sutures were relaxed and the heart was allowed to rest  in the chest.  The vein graft and free right mammary graft were cut to  length, and the proximal anastomoses were performed through 4.0 mm punch  aortotomies.  A partial occlusion clamp was placed on the ascending aorta.  The proximal anastomoses were performed.  The patient was placed in  Trendelenburg position.  The aortic root was deaired and the partial clamp  was removed before tying the vein graft proximal anastomosis.   Both proximal and all three distal anastomoses were inspected for  hemostasis.  Epicardial pacing wires were placed on the right ventricle and  right atrium.  The chest was copiously irrigated with warm normal saline  with 3 mg of vancomycin.  An additional 3 liters of warm normal saline were  used to irrigate the chest as well.  The pericardium was reapproximated with  interrupted 3-0 silk sutures and came together easily  without tension.  Bilateral pleural and two mediastinal chest tubes were placed through  separate subcostal incisions.  The sternum was closed with heavy gauge  interrupted stainless steel wires.  The pectoralis fascia was closed with  running #1 Vicryl suture.  The subcutaneous tissue and skin were closed in a  standard fashion.  A subcuticular skin closure was used.   All sponge, needle and instrument counts were correct at the end of the  procedure.   There were no intraoperative complications.   The patient was taken from the operating room to the surgical intensive care  unit in critical, but stable condition.                                                 Salvatore Decent Dorris Fetch, M.D.    SCH/MEDQ  D:  12/15/2001  T:  12/16/2001  Job:  [id number]   cc:   Charlaine Dalton. Sherene Sires, M.D. [facility]   Talmadge Coventry, M.D.  [address]  [place]  [place] [address]  Fax: [phone]

## 2010-06-12 NOTE — Assessment & Plan Note (Signed)
[facility]                             PULMONARY OFFICE NOTE   NAME:Belinda Day, ANGELE WIEMANN                      MRN:          [id number]  DATE:02/11/2006                            DOB:          09/28/1945    PULMONARY/EXTENDED FOLLOWUP OFFICE VISIT   HISTORY:  A 65 year old, white female with documented right middle lobe  syndrome and evidence of bronchiectasis by CT scan in March 2002, now  comes in with refractory symptoms of nasal congestion, having seen our  nurse practitioner on January 11 and given a course of Omnicef, Mucinex,  and Nasacort.  She still has symptoms of nasal obstruction with slightly  discolored mucus but no headache, fevers, chills, sweats, orthopnea,  PND, leg swelling, chest pain, or dyspnea.   For full inventory of medications, please see facesheet, dated February 04, 2006, which was correct as listed and reviewed with the patient in  detail.   PHYSICAL EXAMINATION:  She is a pleasant, ambulatory, white female in no  acute distress.  She is afebrile, stable vital signs.  HEENT:  Unremarkable.  Oropharynx clear.  Lung fields clear bilaterally to auscultation and percussion.  There is a regular rate and rhythm without murmur, gallop, or rub.  ABDOMEN:  Soft, benign.  EXTREMITIES: Warm without calf tenderness, cyanosis, clubbing, or edema.   IMPRESSION:  Acquired mucociliary dysfunction that previously affected  the right middle lobe but, now, apparently is also affecting sinus  ostial function.  I spent extra time discussing in both graph and text  formatted materials the optimal treatment of chronic rhinitis  emphasizing the cyclical role of inflammation, obstruction, and  infection. suggesting that she use a strategy of using Afrin for 5 days  immediately before using Nasacort and continuing the Nasacort  indefinitely with a 10-day course of Augmentin followed by a CT scan if  not 100% back to baseline.   It would also  be appropriate at this time to have her return for a  followup set of PFTs and a chest x-ray to follow up her lower  respiratory function, having undergone similar studies in 2003 for a 5-  year comparison to see if there is any progression of her lower  respiratory disease status post smoking cessation in 1981 and,  therefore, relatively low risk of progression.     Charlaine Dalton. Sherene Sires, MD, [facility]  Electronically Signed    MBW/MedQ  DD: 02/11/2006  DT: 02/11/2006  Job #: [id number]   cc:   Talmadge Coventry, M.D.

## 2010-06-12 NOTE — Assessment & Plan Note (Signed)
[facility]                             PULMONARY OFFICE NOTE   NAME:Belinda Day, Belinda Day                      MRN:          [id number]  DATE:02/04/2006                            DOB:          Feb 13, 1945    HISTORY OF PRESENT ILLNESS:  The patient is 65 year old white female  patient of Dr. Thurston Hole who has a known history of chronic cough and  rhinitis who presents for an acute office visit. The patient presents  with a 2-week history of cough, nasal congestion, sinus pain and  pressure and post-nasal drip. The patient was seen initially by her  primary care physician in which she reports she got an antibiotic, but  is unsure of the name. The patient reports her cough had improved.  However, she continues to have sinus pain and pressure with green nasal  discharge. The patient denies any hemoptysis, orthopnea, PND or leg  swelling.   PAST MEDICAL HISTORY:  Reviewed.   CURRENT MEDICATIONS:  Reviewed.   PHYSICAL EXAMINATION:  The patient is female in no acute distress. She  is afebrile with stable vital signs. O2 saturation is 96% on room air.  HEENT: Nasal mucosa is erythematous and edematous. Maxillary sinus  tenderness to percussion.  discharge.  NECK: Supple without adenopathy. No JVD.  LUNGS: Lung sounds reveal coarse breath sounds bilaterally without any  wheezing or crackles.  CARDIAC: Regular rate.  ABDOMEN: Soft and nontender.  EXTREMITIES: Warm without calf cyanosis, clubbing or edema.   IMPRESSION/PLAN:  Slow to resolve acute tracheal bronchitis and probable  associated sinusitis. The patient is to begin Omnicef x10 days, Mucinex  twice daily. Add in Veramyst nasal spray two puffs twice daily. The  patient will recheck here in 1 week or sooner if needed with Dr. Sherene Sires.      [name], NP  Electronically Signed      Charlaine Dalton. Sherene Sires, MD, [facility]  Electronically Signed   TP/MedQ  DD: 02/07/2006  DT: 02/07/2006  Job #: [id number]

## 2010-06-23 ENCOUNTER — Other Ambulatory Visit: Payer: Medicare Other | Admitting: Internal Medicine

## 2010-07-11 ENCOUNTER — Encounter: Payer: Self-pay | Admitting: *Deleted

## 2010-07-11 ENCOUNTER — Encounter: Payer: Self-pay | Admitting: Internal Medicine

## 2010-07-16 ENCOUNTER — Ambulatory Visit (INDEPENDENT_AMBULATORY_CARE_PROVIDER_SITE_OTHER): Payer: Medicare Other | Admitting: Internal Medicine

## 2010-07-16 ENCOUNTER — Encounter: Payer: Self-pay | Admitting: Internal Medicine

## 2010-07-16 VITALS — BP 120/74 | HR 70 | Temp 98.3°F | Ht 61.0 in | Wt 127.4 lb

## 2010-07-16 DIAGNOSIS — A310 Pulmonary mycobacterial infection: Secondary | ICD-10-CM

## 2010-07-16 NOTE — Patient Instructions (Signed)
Please schedule a follow up visit in 3 months but call sooner if needed with CXR and PFT's on return  If cipro x 7 days not adequate call for earlier follow up

## 2010-07-16 NOTE — Progress Notes (Signed)
Subjective:     Patient ID: Belinda Day, female   DOB: May 08, 1945, 65 y.o.   MRN: [id number]  HPI 73 yowf quit smoking 1972 with documented right middle lobe  syndrome and evidence of bronchiectasis by CT scan in March 2002,   08/08/07 FOB with classic cobblestoning and MAI on culture.   08/15/07 given Levaquin x 10 days with resolution bloody mucus, but "felt she had flu the whole time" with aches, feverish   August 29, 2007 ov: first post bronch co still coughing up mucus clear and initiate rx with symbicort/ [facility] STARTED   October 19, 2007 ov no cough , sob, feeling great but no improvement on cxr   December 07, 2007 ov feeling great, minimal am cough not productive. No sob.   Opth eval, labs ok 10/09   September 06, 2008 ov overall better over the last year, less tendency to exac on zmax and ethambutol. rec complete another year > satisfied improved 90% and stopped zmax and eth 08/2009   November 19, 2009 ov Cough- retrned after d/.c abx in July- prod with clear sputum. breathing ok and no nocturnal co's or early am exac. Levaquin x 750 mg x 5 day cycles for worsening cough/ congestion or discolored sputum  Work on perfecting inhaler technique: and continue symbicort one twice because too shaky on 2bid   March 31, 2010 ov cough worse levaquin not able to tol and didn't help like zmax and ethambutol did and wants to restart. mucus minimally discolored, worse in am, no sign sob. rec 1) Cipro 750 one twice daily for 7 days for cough flare > tol well 2) Call if not better to restart zmax and ehtambutal.  07/16/2010 ov/Belinda Day better,  No cough or sob.  Pt denies any significant sore throat, dysphagia, itching, sneezing,  nasal congestion or excess/ purulent secretions,  fever, chills, sweats, unintended wt loss, pleuritic or exertional cp, hempoptysis, orthopnea pnd or leg swelling.    Also denies any obvious fluctuation of symptoms with weather or environmental changes or other  aggravating or alleviating factors.           Allergies   1) ! * Tussinex  2) ! Levaquin  3) Erythromycin  4) Codeine     Past Medical History:  Bronchiectasis see CT SE 04/13/00  - HFA 75% November 19, 2009  MAI  - Rx Zmax and [facility] 08/29/07 > 08/2009  - Eye eval scheduled 10/09.......................[name]  - Intol of levaquin so try cycles of cipro April 02, 2010 >>>  HEALTH MAINTENANCE...........................[name] - Td 10/2007  - Pneumovax 2005 second shot  CAD  Hyperlipidemia  Hypertension  History of cough with ACE inhibition.  Gastroesophageal reflux disease       Review of Systems     Objective:   Physical Exam  In general she is a pleasant ambulatory white female in no acute distress.  Wt 130 October 19, 2007 .> 136 December 07, 2007  > 140 March 31, 2010 > 127 07/16/2010  HEENT: nl dentition, turbinates, and orophanx. Nl external ear canals without cough reflex  Neck without JVD/Nodes/TM  Lungs minimal exp late rhonchi bilaterally with a few insp pops and sqeaks as well  RRR no s3 or murmur or increase in P2  Abd soft and benign with nl excursion in the supine position. No bruits or organomegaly  Ext warm without calf tenderness, cyanosis clubbing or edema   cxr 03/31/10 Stable chronic changes with no worrisome focal  or acute abnormality  suggested.     Assessment:          Plan:

## 2010-07-16 NOTE — Assessment & Plan Note (Signed)
I had an extended discussion with the patient today lasting 15 to 20 minutes of a 25 minute visit on the following issues:   Symptoms well controlled on only symbicort 160 one bid and prn cycles of cipro which are tolerated well.  In absence of convincing changes clinically or radiographically I don't recommend any change in rx    Each maintenance medication was reviewed in detail including most importantly the difference between maintenance and as needed and under what circumstances the prns are to be used.  Please see instructions for details which were reviewed in writing and the patient given a copy.  See instructions for specific recommendations which were reviewed directly with the patient who was given a copy with highlighter outlining the key components.

## 2010-07-24 ENCOUNTER — Other Ambulatory Visit (INDEPENDENT_AMBULATORY_CARE_PROVIDER_SITE_OTHER): Payer: Medicare Other

## 2010-07-24 DIAGNOSIS — Z79899 Other long term (current) drug therapy: Secondary | ICD-10-CM

## 2010-07-24 DIAGNOSIS — E785 Hyperlipidemia, unspecified: Secondary | ICD-10-CM

## 2010-07-24 DIAGNOSIS — E039 Hypothyroidism, unspecified: Secondary | ICD-10-CM

## 2010-07-24 DIAGNOSIS — I10 Essential (primary) hypertension: Secondary | ICD-10-CM

## 2010-07-24 LAB — CBC WITH DIFFERENTIAL/PLATELET
Basophils Absolute: 0 10*3/uL (ref 0.0–0.1)
Eosinophils Relative: 1.9 % (ref 0.0–5.0)
Hemoglobin: 13.7 g/dL (ref 12.0–15.0)
Lymphocytes Relative: 30.5 % (ref 12.0–46.0)
Monocytes Relative: 8.4 % (ref 3.0–12.0)
Platelets: 229 10*3/uL (ref 150.0–400.0)
RDW: 14 % (ref 11.5–14.6)
WBC: 4.9 10*3/uL (ref 4.5–10.5)

## 2010-07-24 LAB — BASIC METABOLIC PANEL
CO2: 26 mEq/L (ref 19–32)
Chloride: 102 mEq/L (ref 96–112)
Sodium: 136 mEq/L (ref 135–145)

## 2010-07-24 LAB — HEPATIC FUNCTION PANEL: Albumin: 4.2 g/dL (ref 3.5–5.2)

## 2010-07-24 LAB — LIPID PANEL
HDL: 51.3 mg/dL (ref >39.00)
LDL Cholesterol: 63 mg/dL (ref 0–99)
Total CHOL/HDL Ratio: 2
Triglycerides: 46 mg/dL (ref 0.0–149.0)
VLDL: 9.2 mg/dL (ref 0.0–40.0)

## 2010-07-24 LAB — TSH: TSH: 0.67 u[IU]/mL (ref 0.35–5.50)

## 2010-07-24 LAB — T4, FREE: Free T4: 0.93 ng/dL (ref 0.60–1.60)

## 2010-07-27 ENCOUNTER — Telehealth: Payer: Self-pay

## 2010-07-27 NOTE — Telephone Encounter (Signed)
Message copied by Beverely Low on Mon Jul 27, 2010  4:01 PM ------      Message from: Staci Righter      Created: Mon Jul 27, 2010  3:22 PM       Labs nl

## 2010-07-27 NOTE — Telephone Encounter (Signed)
Pt.notified

## 2010-07-30 ENCOUNTER — Ambulatory Visit: Payer: Medicare Other | Admitting: Internal Medicine

## 2010-07-30 ENCOUNTER — Other Ambulatory Visit: Payer: Self-pay | Admitting: Cardiology

## 2010-08-07 ENCOUNTER — Ambulatory Visit (INDEPENDENT_AMBULATORY_CARE_PROVIDER_SITE_OTHER): Payer: Medicare Other | Admitting: Internal Medicine

## 2010-08-07 ENCOUNTER — Encounter: Payer: Self-pay | Admitting: Internal Medicine

## 2010-08-07 DIAGNOSIS — E039 Hypothyroidism, unspecified: Secondary | ICD-10-CM

## 2010-08-07 DIAGNOSIS — I10 Essential (primary) hypertension: Secondary | ICD-10-CM

## 2010-08-07 DIAGNOSIS — E785 Hyperlipidemia, unspecified: Secondary | ICD-10-CM

## 2010-08-07 MED ORDER — LEVOTHYROXINE SODIUM 75 MCG PO TABS: 75.0000 ug | ORAL_TABLET | Freq: Every day | ORAL | Status: DC

## 2010-08-07 MED ORDER — AMITRIPTYLINE HCL 75 MG PO TABS: 75.0000 mg | ORAL_TABLET | Freq: Every day | ORAL | Status: DC

## 2010-08-07 MED ORDER — CARISOPRODOL 350 MG PO TABS: 350.0000 mg | ORAL_TABLET | Freq: Every day | ORAL | Status: DC

## 2010-08-07 NOTE — Assessment & Plan Note (Signed)
Normotensive and stable. Continue current meds.

## 2010-08-07 NOTE — Progress Notes (Signed)
  Subjective:    Patient ID: Belinda Day, female    DOB: 1945/10/28, 65 y.o.   MRN: [id number]  HPI Pt presents to clinic for evaluation of multiple medical problems. Known history of hyperthyroidism without symptoms of hyper or hypothyroidism currently. On stable dose of Synthroid with good compliance. Reviewed normal TSH and free T4. Tolerate statin therapy without mild distal abnormal LFTs. Fasting liver profile under ideal control. Continues to follow with cardiology for surveillance of CAD and pulmonary for surveillance of bronchiectasis. No recent exacerbation of shortness of breath or wheezing. Stopped taking Ultram because of nausea. No other complaints.  Reviewed past medical history, medications and allergies.  Review of Systems see history of present illness     Objective:   Physical Exam    Physical Exam  [nursing notereviewed. Constitutional:  appears well-developed and well-nourished. No distress.  HENT:  Head: Normocephalic and atraumatic.  Nose: Nose normal.  Mouth/Throat: Oropharynx is clear and moist. No oropharyngeal exudate.  Eyes: Conjunctivae are normal. No scleral icterus.  Neck: Neck supple.  Cardiovascular: Normal rate, regular rhythm and normal heart sounds.  Exam reveals no gallop and no friction rub.   No murmur heard. Pulmonary/Chest: Effort normal and breath sounds normal. No respiratory distress.  no wheezes.  no rales.  Lymphadenopathy:     no cervical adenopathy.  Neurological:  alert.  Skin: Skin is warm and dry.  not diaphoretic.      Assessment & Plan:

## 2010-08-07 NOTE — Assessment & Plan Note (Signed)
Current good control. Continue statin dose.

## 2010-08-07 NOTE — Assessment & Plan Note (Signed)
Stable and asymptomatic. Refill Synthroid at current dosing.

## 2010-10-14 ENCOUNTER — Encounter: Payer: Self-pay | Admitting: Internal Medicine

## 2010-10-14 ENCOUNTER — Ambulatory Visit (INDEPENDENT_AMBULATORY_CARE_PROVIDER_SITE_OTHER)
Admission: RE | Admit: 2010-10-14 | Discharge: 2010-10-14 | Disposition: A | Discharge status: Home / Self Care | Payer: Medicare Other | Source: Ambulatory Visit | Attending: Internal Medicine | Admitting: Internal Medicine

## 2010-10-14 ENCOUNTER — Ambulatory Visit (INDEPENDENT_AMBULATORY_CARE_PROVIDER_SITE_OTHER): Payer: Medicare Other | Admitting: Internal Medicine

## 2010-10-14 VITALS — BP 100/62 | HR 78 | Temp 98.8°F | Ht 61.0 in | Wt 120.0 lb

## 2010-10-14 DIAGNOSIS — A310 Pulmonary mycobacterial infection: Secondary | ICD-10-CM

## 2010-10-14 DIAGNOSIS — R05 Cough: Secondary | ICD-10-CM

## 2010-10-14 DIAGNOSIS — Z23 Encounter for immunization: Secondary | ICD-10-CM

## 2010-10-14 DIAGNOSIS — J479 Bronchiectasis, uncomplicated: Secondary | ICD-10-CM

## 2010-10-14 LAB — PULMONARY FUNCTION TEST

## 2010-10-14 MED ORDER — BUDESONIDE-FORMOTEROL FUMARATE 160-4.5 MCG/ACT IN AERO: 2.0000 | INHALATION_SPRAY | Freq: Two times a day (BID) | RESPIRATORY_TRACT | Status: DC

## 2010-10-14 NOTE — Patient Instructions (Signed)
Work on inhaler technique:  relax and gently blow all the way out then take a nice smooth deep breath back in, triggering the inhaler at same time you start breathing in.  Hold for up to 5 seconds if you can.  Rinse and gargle with water when done   If your mouth or throat starts to bother you,   I suggest you time the inhaler to your dental care and after using the inhaler(s) brush teeth and tongue with a baking soda containing toothpaste and when you rinse this out, gargle with it first to see if this helps your mouth and throat.    We will call you with your xray report  Stop metaprolol 50 mg and replace it with bystolic 5mg  one twice daily x 2 weeks to see if it makes any difference in cough or breathing and if so let us know, if not resume the metaprolol

## 2010-10-14 NOTE — Assessment & Plan Note (Signed)
No definite indication to resume longterm rx at this point.  Discussed in detail all the  indications, usual  risks and alternatives  relative to the benefits with patient who agrees to proceed with conservative f/u

## 2010-10-14 NOTE — Progress Notes (Signed)
PFT done today. 

## 2010-10-14 NOTE — Progress Notes (Signed)
Subjective:     Patient ID: Belinda Day, female   DOB: 1946/01/12, 65 y.o.   MRN: [id number]  HPI 59 yowf quit smoking 1972 with documented right middle lobe  syndrome and evidence of bronchiectasis by CT scan in March 2002,   08/08/07 FOB with classic cobblestoning and MAI on culture.   08/15/07 given Levaquin x 10 days with resolution bloody mucus, but "felt she had flu the whole time" with aches, feverish   August 29, 2007 ov: first post bronch co still coughing up mucus clear and initiate rx with symbicort/ [facility] AND [facility] FIRST STARTED   October 19, 2007 ov no cough , sob, feeling great but no improvement on cxr   December 07, 2007 ov feeling great, minimal am cough not productive. No sob.   Opth eval, labs ok 10/09   September 06, 2008 ov overall better over the last year, less tendency to exac on zmax and ethambutol. rec complete another year > satisfied improved 90% and stopped zmax and eth 08/2009   November 19, 2009 ov Cough- retrned after d/.c abx in July- prod with clear sputum. breathing ok and no nocturnal co's or early am exac. Levaquin x 750 mg x 5 day cycles for worsening cough/ congestion or discolored sputum  Work on perfecting inhaler technique: and continue symbicort one twice because too shaky on 2bid   March 31, 2010 ov cough worse levaquin not able to tol and didn't help like zmax and ethambutol did and wants to restart. mucus minimally discolored, worse in am, no sign sob. rec 1) Cipro 750 one twice daily for 7 days for cough flare > tol well 2) Call if not better to restart zmax and ehtambutal.  07/16/2010 ov/Belinda Day better,  No cough or sob.  rec Please schedule a follow up visit in 3 months but call sooner if needed with CXR and PFT's on return  If cipro x 7 days not adequate call for earlier follow up    10/14/2010 f/u ov/Belinda Day maintained on symbicort 80 2bid limited to walking nl pace but can "go anywhere" and able to kayak, throat clearing day more night with no  am exac but cough def worse off MAI rx   Pt denies any significant sore throat, dysphagia, itching, sneezing,  nasal congestion or excess/ purulent secretions,  fever, chills, sweats, unintended wt loss, pleuritic or exertional cp, hempoptysis, orthopnea pnd or leg swelling.    Also denies any obvious fluctuation of symptoms with weather or environmental changes or other aggravating or alleviating factors.           Allergies   1) ! * Tussinex  2) ! Levaquin  3) Erythromycin  4) Codeine     Past Medical History:  Bronchiectasis see CT SE 04/13/00  - HFA 75% November 19, 2009  MAI  - Rx Zmax and [facility] 08/29/07 > 08/2009  - Eye eval scheduled 10/09.......................[name]  - Intol of levaquin so try cycles of cipro April 02, 2010 >>>  HEALTH MAINTENANCE...........................[name] - Td 10/2007  - Pneumovax 2005 second shot  CAD  Hyperlipidemia  Hypertension  History of cough with ACE inhibition.  Gastroesophageal reflux disease             Objective:   Physical Exam   In general she is a pleasant ambulatory white female in no acute distress.   Wt 130 October 19, 2007 .> 136 December 07, 2007  > 140 March 31, 2010 > 127 07/16/2010 > 10/14/2010  120  HEENT: nl dentition, turbinates, and orophanx. Nl external ear canals without cough reflex  Neck without JVD/Nodes/TM  Lungs minimal exp late rhonchi bilaterally with a few insp pops and sqeaks as well  RRR no s3 or murmur or increase in P2  Abd soft and benign with nl excursion in the supine position. No bruits or organomegaly  Ext warm without calf tenderness, cyanosis clubbing or edema   cxr 03/31/10 Stable chronic changes with no worrisome focal or acute abnormality  Suggested.  CXR  10/14/2010 :  Stable appearance of findings likely related to chronic atypical  infection/scarring. No evidence of acute superimposed process      Assessment:          Plan:

## 2010-10-14 NOTE — Assessment & Plan Note (Addendum)
Adequate control on present rx, reviewed   The proper method of use, as well as anticipated side effects, of this metered-dose inhaler are discussed and demonstrated to the patient. Improved to 75% with coaching     Each maintenance medication was reviewed in detail including most importantly the difference between maintenance and as needed and under what circumstances the prns are to be used.  Please see instructions for details which were reviewed in writing and the patient given a copy.

## 2010-10-22 LAB — AFB CULTURE WITH SMEAR (NOT AT ARMC)

## 2010-10-22 LAB — FUNGUS CULTURE W SMEAR: Fungal Smear: NONE SEEN

## 2010-10-22 LAB — CULTURE, RESPIRATORY W GRAM STAIN

## 2010-10-26 ENCOUNTER — Encounter: Payer: Self-pay | Admitting: Internal Medicine

## 2010-10-27 ENCOUNTER — Telehealth: Payer: Self-pay | Admitting: Internal Medicine

## 2010-10-27 MED ORDER — NEBIVOLOL HCL 5 MG PO TABS: 5.0000 mg | ORAL_TABLET | Freq: Every day | ORAL | Status: DC

## 2010-10-27 NOTE — Telephone Encounter (Signed)
We can refill the bystolic but need to be sure bp ok on it - either see me/ Hodgin or Tammy NP for this before committing her to a year's rx

## 2010-10-27 NOTE — Telephone Encounter (Signed)
Spoke with pt. She states has noticed much less cough since was told to d/c metoprolol and start bystolic 5 mg. She is requesting rx for this. MW, do you want to refill this, or should Dr Jens Som who follows her for BP? She also wants to know when her next rov here should be. Please advise, thanks!

## 2010-10-27 NOTE — Telephone Encounter (Signed)
Spoke with pt and notified of recs per MW. She states has been checking BP at home and it's well controlled. She has ov with cards in Dec, so will give her enough until then, and advised to call in the meantime if her readings at home are abnormal.  Pt verbalized understanding and rx was sent to pharm.

## 2010-11-04 ENCOUNTER — Telehealth: Payer: Self-pay | Admitting: Cardiology

## 2010-11-04 NOTE — Telephone Encounter (Signed)
Spoke with pt, she made a mistake and took her bp meds twice today, this am and at noon. Now when she checked her bp the bottom number was low and she is a little anxious. Reassurance given to pt no worries. She feels fine and is not having any dizziness or other problems. She will call back with problems Belinda Day

## 2010-11-04 NOTE — Telephone Encounter (Signed)
Pt called Her BP is low 98/45 at 430  She is concerned please call

## 2010-12-31 ENCOUNTER — Telehealth: Payer: Self-pay | Admitting: Internal Medicine

## 2010-12-31 DIAGNOSIS — E785 Hyperlipidemia, unspecified: Secondary | ICD-10-CM

## 2010-12-31 DIAGNOSIS — Z79899 Other long term (current) drug therapy: Secondary | ICD-10-CM

## 2010-12-31 DIAGNOSIS — E039 Hypothyroidism, unspecified: Secondary | ICD-10-CM

## 2010-12-31 NOTE — Telephone Encounter (Signed)
Dr Rodena Medin what labs would you like ordered for this patient?

## 2010-12-31 NOTE — Telephone Encounter (Signed)
Patient states that she needs labs prior to January visit. She will be going to [facility]. Please order labs

## 2010-12-31 NOTE — Telephone Encounter (Signed)
chem7 v58.69, lipid/lft 272.4 and tsh (hypothyroidism)

## 2011-01-01 NOTE — Telephone Encounter (Signed)
Lab orders entered for January 2013. 

## 2011-01-14 ENCOUNTER — Encounter: Payer: Self-pay | Admitting: Cardiology

## 2011-01-14 ENCOUNTER — Ambulatory Visit (INDEPENDENT_AMBULATORY_CARE_PROVIDER_SITE_OTHER): Payer: Medicare Other | Admitting: Cardiology

## 2011-01-14 DIAGNOSIS — Z8679 Personal history of other diseases of the circulatory system: Secondary | ICD-10-CM

## 2011-01-14 DIAGNOSIS — I10 Essential (primary) hypertension: Secondary | ICD-10-CM

## 2011-01-14 DIAGNOSIS — I2510 Atherosclerotic heart disease of native coronary artery without angina pectoris: Secondary | ICD-10-CM

## 2011-01-14 MED ORDER — ATORVASTATIN CALCIUM 80 MG PO TABS: 80.0000 mg | ORAL_TABLET | Freq: Every day | ORAL | Status: DC

## 2011-01-14 MED ORDER — VALSARTAN 160 MG PO TABS: 160.0000 mg | ORAL_TABLET | Freq: Every day | ORAL | Status: DC

## 2011-01-14 MED ORDER — NEBIVOLOL HCL 5 MG PO TABS: 5.0000 mg | ORAL_TABLET | Freq: Every day | ORAL | Status: DC

## 2011-01-14 NOTE — Progress Notes (Signed)
HPI:[name] is a very pleasant  female with a history of coronary artery disease status post coronary artery bypass graft in 2002.  Her last Myoview in Dec 2010, showed no scar or ischemia, and her ejection fraction was 77%. Carotid Dopplers performed in January of 2012 showed 0-39% stenosis bilaterally. I last saw her in December of 2011. Since then the patient denies any dyspnea on exertion, orthopnea, PND, pedal edema, palpitations, syncope or chest pain.   Current Outpatient Prescriptions  Medication Sig Dispense Refill  . amitriptyline (ELAVIL) 75 MG tablet Take 1 tablet (75 mg total) by mouth at bedtime.  90 tablet  2  . aspirin 81 MG tablet Take 81 mg by mouth daily.        Marland Kitchen atorvastatin (LIPITOR) 80 MG tablet TAKE 1 TABLET BY MOUTH ONCE DAILY  30 tablet  5  . budesonide-formoterol (SYMBICORT) 160-4.5 MCG/ACT inhaler Inhale 2 puffs into the lungs 2 (two) times daily.  1 Inhaler  12  . calcium carbonate (OS-CAL) 600 MG TABS Take 600 mg by mouth daily.        . carisoprodol (SOMA) 350 MG tablet Take 1 tablet (350 mg total) by mouth daily.  90 tablet  2  . Coenzyme Q10 (COQ10) 200 MG CAPS Take 200 mg by mouth daily.        . folic acid-vitamin b complex-vitamin c-selenium-zinc (DIALYVITE) 3 MG TABS Take 1 tablet by mouth daily.        Marland Kitchen levothyroxine (SYNTHROID, LEVOTHROID) 75 MCG tablet Take 1 tablet (75 mcg total) by mouth daily.  90 tablet  2  . Multiple Vitamin (MULTIVITAMIN) tablet Take 1 tablet by mouth daily.        . nebivolol (BYSTOLIC) 5 MG tablet Take 1 tablet (5 mg total) by mouth daily.  30 tablet  2  . OXYGEN-HELIUM IN Inhale 2.5 L into the lungs at bedtime.       . valsartan (DIOVAN) 160 MG tablet Take 160 mg by mouth daily.           Past Medical History  Diagnosis Date  . Bronchiectasis   . MAI (mycobacterium avium-intracellulare)   . CAD (coronary artery disease)   . Hyperlipidemia   . HTN (hypertension)   . GERD (gastroesophageal reflux disease)     Past  Surgical History  Procedure Date  . Coronary artery bypass graft 2002    History   Social History  . Marital Status: Single    Spouse Name: N/A    Number of Children: N/A  . Years of Education: N/A   Occupational History  . Not on file.   Social History Main Topics  . Smoking status: Former Smoker    Quit date: 01/25/1970  . Smokeless tobacco: Not on file  . Alcohol Use: No  . Drug Use: No  . Sexually Active: Not on file   Other Topics Concern  . Not on file   Social History Narrative  . No narrative on file    ROS: no fevers or chills, productive cough, hemoptysis, dysphasia, odynophagia, melena, hematochezia, dysuria, hematuria, rash, seizure activity, orthopnea, PND, pedal edema, claudication. Remaining systems are negative.  Physical Exam: Well-developed well-nourished in no acute distress.  Skin is warm and dry.  HEENT is normal.  Neck is supple. No thyromegaly.  Chest is clear to auscultation with normal expansion.  Cardiovascular exam is regular rate and rhythm.  Abdominal exam nontender or distended. No masses palpated. Extremities show no edema. neuro  grossly intact  ECG NSR, no ST changes

## 2011-01-14 NOTE — Assessment & Plan Note (Signed)
Continue statin. Lipids and liver monitored by primary care. 

## 2011-01-14 NOTE — Assessment & Plan Note (Signed)
Mild on most recent carotid Dopplers. Continue aspirin and statin.

## 2011-01-14 NOTE — Patient Instructions (Signed)
Your physician recommends that you continue on your current medications as directed. Please refer to the Current Medication list given to you today.   Your physician wants you to follow-up in: 1 year with Dr. Crenshaw You will receive a reminder letter in the mail two months in advance. If you don't receive a letter, please call our office to schedule the follow-up appointment.   

## 2011-01-14 NOTE — Assessment & Plan Note (Signed)
Blood pressure controlled. Continue present medications. Potassium and renal function monitored by primary care. 

## 2011-01-14 NOTE — Assessment & Plan Note (Signed)
Continue aspirin and statin. Plan Myoview when she returns in one year. 

## 2011-02-02 ENCOUNTER — Other Ambulatory Visit: Payer: Self-pay | Admitting: *Deleted

## 2011-02-02 DIAGNOSIS — Z79899 Other long term (current) drug therapy: Secondary | ICD-10-CM

## 2011-02-02 DIAGNOSIS — E785 Hyperlipidemia, unspecified: Secondary | ICD-10-CM

## 2011-02-02 DIAGNOSIS — E039 Hypothyroidism, unspecified: Secondary | ICD-10-CM

## 2011-02-02 LAB — HEPATIC FUNCTION PANEL
ALT: 18 U/L (ref 0–35)
AST: 28 U/L (ref 0–37)
Albumin: 4.2 g/dL (ref 3.5–5.2)
Total Bilirubin: 0.4 mg/dL (ref 0.3–1.2)
Total Protein: 6.4 g/dL (ref 6.0–8.3)

## 2011-02-02 LAB — BASIC METABOLIC PANEL
CO2: 26 mEq/L (ref 19–32)
Calcium: 9.2 mg/dL (ref 8.4–10.5)
Sodium: 140 mEq/L (ref 135–145)

## 2011-02-02 LAB — TSH: TSH: 5.187 u[IU]/mL — ABNORMAL HIGH (ref 0.350–4.500)

## 2011-02-02 LAB — LIPID PANEL
Cholesterol: 133 mg/dL (ref 0–200)
HDL: 45 mg/dL (ref >39)
Total CHOL/HDL Ratio: 3 Ratio

## 2011-02-08 ENCOUNTER — Telehealth: Payer: Self-pay | Admitting: Internal Medicine

## 2011-02-08 ENCOUNTER — Encounter: Payer: Self-pay | Admitting: Internal Medicine

## 2011-02-08 ENCOUNTER — Ambulatory Visit (INDEPENDENT_AMBULATORY_CARE_PROVIDER_SITE_OTHER): Payer: Medicare Other | Admitting: Internal Medicine

## 2011-02-08 DIAGNOSIS — I10 Essential (primary) hypertension: Secondary | ICD-10-CM

## 2011-02-08 DIAGNOSIS — E039 Hypothyroidism, unspecified: Secondary | ICD-10-CM

## 2011-02-08 DIAGNOSIS — Z79899 Other long term (current) drug therapy: Secondary | ICD-10-CM

## 2011-02-08 DIAGNOSIS — E785 Hyperlipidemia, unspecified: Secondary | ICD-10-CM

## 2011-02-08 MED ORDER — CARISOPRODOL 350 MG PO TABS: 350.0000 mg | ORAL_TABLET | Freq: Every day | ORAL | Status: DC

## 2011-02-08 MED ORDER — AMITRIPTYLINE HCL 75 MG PO TABS: 75.0000 mg | ORAL_TABLET | Freq: Every day | ORAL | Status: DC

## 2011-02-08 NOTE — Progress Notes (Signed)
  Subjective:    Patient ID: Belinda Day, female    DOB: 19-Nov-1945, 66 y.o.   MRN: [id number]  HPI Pt presents to clinic for followup of multiple medical problems. Lost weight intentionally. BP reviewed as normotensive. Tolerates statin tx. H/o hypothyroidism and reviewed mildy elevated tsh. Compliant with synthroid daily with no s/s hypothyroidism. No other complaints.  Past Medical History  Diagnosis Date  . Bronchiectasis   . MAI (mycobacterium avium-intracellulare)   . CAD (coronary artery disease)   . Hyperlipidemia   . HTN (hypertension)   . GERD (gastroesophageal reflux disease)    Past Surgical History  Procedure Date  . Coronary artery bypass graft 2002    reports that she quit smoking about 41 years ago. She has never used smokeless tobacco. She reports that she does not drink alcohol or use illicit drugs. family history includes Arthritis in her father; Cancer in her father and mother; Diabetes in her mother; Heart disease in her father and mother; Hyperlipidemia in her father and mother; Hypertension in her father and mother; and Stroke in her father and mother.  There is no history of Atopy. Allergies  Allergen Reactions  . Codeine     REACTION: nausea  . Erythromycin     REACTION: nausea  . Levofloxacin     REACTION: aches      Review of Systems see hpi     Objective:   Physical Exam  Physical Exam  Nursing note and vitals reviewed. Constitutional: Appears well-developed and well-nourished. No distress.  HENT:  Head: Normocephalic and atraumatic.  Right Ear: External ear normal.  Left Ear: External ear normal.  Eyes: Conjunctivae are normal. No scleral icterus.  Neck: Neck supple. Carotid bruit is not present.  Cardiovascular: Normal rate, regular rhythm and normal heart sounds.  Exam reveals no gallop and no friction rub.   No murmur heard. Pulmonary/Chest: Effort normal and breath sounds normal. No respiratory distress. He has no wheezes. no rales.   Lymphadenopathy:    He has no cervical adenopathy.  Neurological:Alert.  Skin: Skin is warm and dry. Not diaphoretic.  Psychiatric: Has a normal mood and affect.         Assessment & Plan:

## 2011-02-08 NOTE — Patient Instructions (Signed)
Please schedule tsh and free t4 in ~10 weeks (hypothyroidism) Also please schedule cbc 401.9, chem7 v58.69, lipid/lft 272.4 and tsh/free t4 prior to your next follow up appointment

## 2011-02-08 NOTE — Assessment & Plan Note (Signed)
Mildly underactive. Asx. Increase dose to 1.5 tablets on mondays all other days 1 qd. Recheck tsh/ft4 ~10 weeks

## 2011-02-08 NOTE — Assessment & Plan Note (Signed)
Good control. Continue current regimen. 

## 2011-02-08 NOTE — Telephone Encounter (Signed)
Future lab orders have been entered and copies forwarded to the lab.

## 2011-02-08 NOTE — Assessment & Plan Note (Signed)
Normotensive and stable. Continue current regimen. Monitor bp as outpt and followup in clinic as scheduled.  

## 2011-03-29 ENCOUNTER — Other Ambulatory Visit: Payer: Self-pay | Admitting: Dermatology

## 2011-04-19 ENCOUNTER — Telehealth: Payer: Self-pay | Admitting: Internal Medicine

## 2011-04-19 ENCOUNTER — Ambulatory Visit (INDEPENDENT_AMBULATORY_CARE_PROVIDER_SITE_OTHER)
Admission: RE | Admit: 2011-04-19 | Discharge: 2011-04-19 | Disposition: A | Discharge status: Home / Self Care | Payer: Medicare Other | Source: Ambulatory Visit | Attending: Critical Care Medicine | Admitting: Critical Care Medicine

## 2011-04-19 ENCOUNTER — Encounter: Payer: Self-pay | Admitting: Critical Care Medicine

## 2011-04-19 ENCOUNTER — Ambulatory Visit (INDEPENDENT_AMBULATORY_CARE_PROVIDER_SITE_OTHER): Payer: Medicare Other | Admitting: Critical Care Medicine

## 2011-04-19 ENCOUNTER — Telehealth: Payer: Self-pay | Admitting: Critical Care Medicine

## 2011-04-19 VITALS — BP 142/80 | HR 90 | Temp 98.9°F | Ht 62.0 in | Wt 114.0 lb

## 2011-04-19 DIAGNOSIS — J479 Bronchiectasis, uncomplicated: Secondary | ICD-10-CM

## 2011-04-19 DIAGNOSIS — R05 Cough: Secondary | ICD-10-CM

## 2011-04-19 MED ORDER — SULFAMETHOXAZOLE-TMP DS 800-160 MG PO TABS: 1.0000 | ORAL_TABLET | Freq: Two times a day (BID) | ORAL | Status: DC

## 2011-04-19 MED ORDER — HYDROCODONE-HOMATROPINE 5-1.5 MG/5ML PO SYRP: 5.0000 mL | ORAL_SOLUTION | Freq: Four times a day (QID) | ORAL | Status: AC | PRN

## 2011-04-19 NOTE — Telephone Encounter (Signed)
Notes Recorded by Storm Frisk, MD on 04/19/2011 at 4:56 PM Notify the patient that the Xray is stable and no pneumonia No change in medications are recommended. Continue current meds as prescribed at last office visit     Called, spoke with pt.  I informed her of above results and recs per PW.  She verbalized understanding of this and voiced no further questions/concerns at this time.

## 2011-04-19 NOTE — Progress Notes (Signed)
Quick Note:  Called pt's home # - [email or web address] ______ 

## 2011-04-19 NOTE — Patient Instructions (Signed)
Chest xray today Bactrim DS one twice daily for 10days Stop cipro Hycodan cough syrup every 6 hours as needed for cough Stay on symbicort twice daily Return to see Dr Sherene Sires for recheck in 2 weeks

## 2011-04-19 NOTE — Progress Notes (Signed)
Subjective:    Patient ID: Belinda Day, female    DOB: 1945/02/06, 66 y.o.   MRN: [id number]  HPI  39 yowf quit smoking 1972 with documented right middle lobe  syndrome and evidence of bronchiectasis by CT scan in March 2002,   08/08/07 FOB with classic cobblestoning and MAI on culture.   08/15/07 given Levaquin x 10 days with resolution bloody mucus, but "felt she had flu the whole time" with aches, feverish   August 29, 2007 ov: first post bronch co still coughing up mucus clear and initiate rx with symbicort/ [facility] AND [facility] STARTED   October 19, 2007 ov no cough , sob, feeling great but no improvement on cxr   December 07, 2007 ov feeling great, minimal am cough not productive. No sob.   Opth eval, labs ok 10/09   September 06, 2008 ov overall better over the last year, less tendency to exac on zmax and ethambutol. rec complete another year > satisfied improved 90% and stopped zmax and eth 08/2009   November 19, 2009 ov Cough- retrned after d/.c abx in July- prod with clear sputum. breathing ok and no nocturnal co's or early am exac. Levaquin x 750 mg x 5 day cycles for worsening cough/ congestion or discolored sputum  Work on perfecting inhaler technique: and continue symbicort one twice because too shaky on 2bid   March 31, 2010 ov cough worse levaquin not able to tol and didn't help like zmax and ethambutol did and wants to restart. mucus minimally discolored, worse in am, no sign sob. rec 1) Cipro 750 one twice daily for 7 days for cough flare > tol well 2) Call if not better to restart zmax and ehtambutal.  07/16/2010 ov/Wert better,  No cough or sob.  rec Please schedule a follow up visit in 3 months but call sooner if needed with CXR and PFT's on return  If cipro x 7 days not adequate call for earlier follow up    10/14/2010 f/u ov/Wert maintained on symbicort 80 2bid limited to walking nl pace but can "go anywhere" and able to kayak, throat clearing day more night with  no am exac but cough def worse off MAI rx   Pt denies any significant sore throat, dysphagia, itching, sneezing,  nasal congestion or excess/ purulent secretions,  fever, chills, sweats, unintended wt loss, pleuritic or exertional cp, hempoptysis, orthopnea pnd or leg swelling.    Also denies any obvious fluctuation of symptoms with weather or environmental changes or other aggravating or alleviating factors.     04/19/2011 Started 4 days ago cough,  Productive yellow.  No hemoptysis.  Started cipro 500 twice a day and over the past 4 days not any better.  Has been vomiting some of the med up Notes some chest pain if in a spell.  Notes some pn drip.  Tried mucinex and of no benefit  Past Medical History  Diagnosis Date  . Bronchiectasis   . MAI (mycobacterium avium-intracellulare)   . CAD (coronary artery disease)   . Hyperlipidemia   . HTN (hypertension)   . GERD (gastroesophageal reflux disease)      Family History  Problem Relation Age of Onset  . Cancer Mother     colon  . Hyperlipidemia Mother   . Hypertension Mother   . Heart disease Mother   . Stroke Mother   . Diabetes Mother   . Cancer Father     colon  . Arthritis Father   .  Hyperlipidemia Father   . Hypertension Father   . Heart disease Father   . Stroke Father   . Atopy Neg Hx      History   Social History  . Marital Status: Single    Spouse Name: N/A    Number of Children: N/A  . Years of Education: N/A   Occupational History  . Not on file.   Social History Main Topics  . Smoking status: Former Smoker -- 1.0 packs/day for 20 years    Types: Cigarettes    Quit date: 01/25/1970  . Smokeless tobacco: Never Used  . Alcohol Use: No  . Drug Use: No  . Sexually Active: Not on file   Other Topics Concern  . Not on file   Social History Narrative  . No narrative on file     Allergies  Allergen Reactions  . Codeine     REACTION: nausea  . Erythromycin     REACTION: nausea  . Levofloxacin      REACTION: aches     Outpatient Prescriptions Prior to Visit  Medication Sig Dispense Refill  . amitriptyline (ELAVIL) 75 MG tablet Take 1 tablet (75 mg total) by mouth at bedtime.  90 tablet  1  . aspirin 81 MG tablet Take 81 mg by mouth daily.        Marland Kitchen atorvastatin (LIPITOR) 80 MG tablet Take 1 tablet (80 mg total) by mouth daily.  90 tablet  3  . budesonide-formoterol (SYMBICORT) 160-4.5 MCG/ACT inhaler Inhale 2 puffs into the lungs 2 (two) times daily.  1 Inhaler  12  . calcium carbonate (OS-CAL) 600 MG TABS Take 600 mg by mouth daily.        . carisoprodol (SOMA) 350 MG tablet Take 1 tablet (350 mg total) by mouth daily.  90 tablet  1  . Coenzyme Q10 (COQ10) 200 MG CAPS Take 200 mg by mouth daily.        . folic acid-vitamin b complex-vitamin c-selenium-zinc (DIALYVITE) 3 MG TABS Take 1 tablet by mouth daily.        Marland Kitchen levothyroxine (SYNTHROID, LEVOTHROID) 75 MCG tablet Take 1 tablet (75 mcg total) by mouth daily.  90 tablet  2  . Multiple Vitamin (MULTIVITAMIN) tablet Take 1 tablet by mouth daily.        . nebivolol (BYSTOLIC) 5 MG tablet Take 1 tablet (5 mg total) by mouth daily.  90 tablet  3  . OXYGEN-HELIUM IN Inhale 2.5 L into the lungs at bedtime.       . valsartan (DIOVAN) 160 MG tablet Take 1 tablet (160 mg total) by mouth daily.  90 tablet  3     Review of Systems Constitutional:   No  weight loss, night sweats,  Fevers, chills, fatigue, lassitude. HEENT:   No headaches,  Difficulty swallowing,  Tooth/dental problems,  Sore throat,                No sneezing, itching, ear ache, nasal congestion, post nasal drip,   CV:  No chest pain,  Orthopnea, PND, swelling in lower extremities, anasarca, dizziness, palpitations  GI  No heartburn, indigestion, abdominal pain, nausea, vomiting, diarrhea, change in bowel habits, loss of appetite  Resp: Notes shortness of breath with exertion and  at rest.  Notes  excess mucus, notes  productive cough,  No non-productive cough,  No coughing  up of blood.  Notes  change in color of mucus.  No wheezing.  No chest wall deformity  Skin: no rash or lesions.  GU: no dysuria, change in color of urine, no urgency or frequency.  No flank pain.  MS:  No joint pain or swelling.  No decreased range of motion.  No back pain.  Psych:  No change in mood or affect. No depression or anxiety.  No memory loss.     Objective:   Physical Exam  Filed Vitals:   04/19/11 1541  BP: 142/80  Pulse: 90  Temp: 98.9 F (37.2 C)  TempSrc: Oral  Height: 5\' 2"  (1.575 m)  Weight: 114 lb (51.71 kg)  SpO2: 94%    Gen: Pleasant, well-nourished, in no distress,  normal affect  ENT: No lesions,  mouth clear,  oropharynx clear, no postnasal drip  Neck: No JVD, no TMG, no carotid bruits  Lungs: No use of accessory muscles, no dullness to percussion, scattered rhonchi and poor coarse breath sounds   Cardiovascular: RRR, heart sounds normal, no murmur or gallops, no peripheral edema  Abdomen: soft and NT, no HSM,  BS normal  Musculoskeletal: No deformities, no cyanosis or clubbing  Neuro: alert, non focal  Skin: Warm, no lesions or rashes  Dg Chest 2 View  04/19/2011  *RADIOLOGY REPORT*  Clinical Data: 66 year old female with cough, shortness of breath, upper chest and back pain.  CHEST - 2 VIEW  Comparison: 10/14/2010 and earlier.  Findings: Architectural distortion about both hila is stable and likely there is associated bronchiectasis.  Stable lung volumes. No pneumothorax, pulmonary edema, consolidation, or acute pulmonary opacity. Blunting of the costophrenic sulci appears stable without definite effusion.  Sequelae of CABG.  Stable cardiac size and mediastinal contours.  Visualized tracheal air column is within normal limits.  No acute osseous abnormality identified.  IMPRESSION: Stable chronic changes including bronchiectasis and scarring in the right middle lobe and lingula. No superimposed acute findings are identified.  Original Report  Authenticated By: Harley Hallmark, M.D.         Assessment & Plan:   BRONCHIECTASIS Bronchiectasis with right middle and left lingular involvement now with acute flare Plan Obtain chest x-ray and review>>>see results above>>>no active disease, pt aware  Administer Bactrim double strength one twice a day for 10 days Discontinue Cipro Administer Hycodan cough syrup as needed Maintain inhaled medications as prescribed Return followup with Dr. Sherene Sires in 2 weeks     Updated Medication List Outpatient Encounter Prescriptions as of 04/19/2011  Medication Sig Dispense Refill  . amitriptyline (ELAVIL) 75 MG tablet Take 1 tablet (75 mg total) by mouth at bedtime.  90 tablet  1  . aspirin 81 MG tablet Take 81 mg by mouth daily.        Marland Kitchen atorvastatin (LIPITOR) 80 MG tablet Take 1 tablet (80 mg total) by mouth daily.  90 tablet  3  . budesonide-formoterol (SYMBICORT) 160-4.5 MCG/ACT inhaler Inhale 2 puffs into the lungs 2 (two) times daily.  1 Inhaler  12  . calcium carbonate (OS-CAL) 600 MG TABS Take 600 mg by mouth daily.        . carisoprodol (SOMA) 350 MG tablet Take 1 tablet (350 mg total) by mouth daily.  90 tablet  1  . Coenzyme Q10 (COQ10) 200 MG CAPS Take 200 mg by mouth daily.        . folic acid-vitamin b complex-vitamin c-selenium-zinc (DIALYVITE) 3 MG TABS Take 1 tablet by mouth daily.        Marland Kitchen levothyroxine (SYNTHROID, LEVOTHROID) 75 MCG tablet Take 1 tablet (75 mcg total)  by mouth daily.  90 tablet  2  . Multiple Vitamin (MULTIVITAMIN) tablet Take 1 tablet by mouth daily.        . nebivolol (BYSTOLIC) 5 MG tablet Take 1 tablet (5 mg total) by mouth daily.  90 tablet  3  . OXYGEN-HELIUM IN Inhale 2.5 L into the lungs at bedtime.       . valsartan (DIOVAN) 160 MG tablet Take 1 tablet (160 mg total) by mouth daily.  90 tablet  3  . HYDROcodone-homatropine (HYCODAN) 5-1.5 MG/5ML syrup Take 5 mLs by mouth every 6 (six) hours as needed for cough.  120 mL  0  .  sulfamethoxazole-trimethoprim (BACTRIM DS) 800-160 MG per tablet Take 1 tablet by mouth 2 (two) times daily.  20 tablet  0

## 2011-04-19 NOTE — Assessment & Plan Note (Addendum)
Bronchiectasis with right middle and left lingular involvement now with acute flare Plan Obtain chest x-ray and review Administer Bactrim double strength one twice a day for 10 days Discontinue Cipro Administer Hycodan cough syrup as needed Maintain inhaled medications as prescribed Return followup with Dr. Sherene Sires in 2 weeks

## 2011-04-19 NOTE — Progress Notes (Signed)
Quick Note:  Called, spoke with pt. I informed her xray stable, no pna per PW. Advised he recs she cont current meds and recs from today's OV. She verbalized understanding of this and voiced no further questions/concerns at this time. ______

## 2011-04-19 NOTE — Telephone Encounter (Signed)
Pt c/o persistent cough for about 1 week. She has tried OTC cough syrup that is not helping. Pt has not been seen since 09/2010 and will need OV. Pt is scheduled for 3:45pm this afternoon with PW for eval of cough.

## 2011-04-19 NOTE — Progress Notes (Signed)
Quick Note:  Notify the patient that the Xray is stable and no pneumonia No change in medications are recommended. Continue current meds as prescribed at last office visit ______ 

## 2011-05-04 ENCOUNTER — Encounter: Payer: Self-pay | Admitting: Internal Medicine

## 2011-05-04 ENCOUNTER — Ambulatory Visit (INDEPENDENT_AMBULATORY_CARE_PROVIDER_SITE_OTHER): Payer: Medicare Other | Admitting: Internal Medicine

## 2011-05-04 VITALS — BP 118/68 | HR 83 | Temp 98.0°F | Ht 62.0 in | Wt 117.0 lb

## 2011-05-04 DIAGNOSIS — J479 Bronchiectasis, uncomplicated: Secondary | ICD-10-CM

## 2011-05-04 MED ORDER — SULFAMETHOXAZOLE-TMP DS 800-160 MG PO TABS: 1.0000 | ORAL_TABLET | Freq: Two times a day (BID) | ORAL | Status: AC

## 2011-05-04 NOTE — Progress Notes (Deleted)
Subjective:    Patient ID: Belinda Day, female    DOB: 02-Dec-1945, 66 y.o.   MRN: [id number]  HPI  42 yowf quit smoking 1972 with documented right middle lobe  syndrome and evidence of bronchiectasis by CT scan in March 2002,   08/08/07 FOB with classic cobblestoning and MAI on culture.   08/15/07 given Levaquin x 10 days with resolution bloody mucus, but "felt she had flu the whole time" with aches, feverish   August 29, 2007 ov: first post bronch co still coughing up mucus clear and initiate rx with symbicort/ [facility] AND [facility] STARTED   October 19, 2007 ov no cough , sob, feeling great but no improvement on cxr   December 07, 2007 ov feeling great, minimal am cough not productive. No sob.   Opth eval, labs ok 10/09   September 06, 2008 ov overall better over the last year, less tendency to exac on zmax and ethambutol. rec complete another year > satisfied improved 90% and stopped zmax and eth 08/2009   November 19, 2009 ov Cough- retrned after d/.c abx in July- prod with clear sputum. breathing ok and no nocturnal co's or early am exac. Levaquin x 750 mg x 5 day cycles for worsening cough/ congestion or discolored sputum  Work on perfecting inhaler technique: and continue symbicort one twice because too shaky on 2bid   March 31, 2010 ov cough worse levaquin not able to tol and didn't help like zmax and ethambutol did and wants to restart. mucus minimally discolored, worse in am, no sign sob. rec 1) Cipro 750 one twice daily for 7 days for cough flare > tol well 2) Call if not better to restart zmax and ehtambutal.  07/16/2010 ov/Yousof Alderman better,  No cough or sob.  rec Please schedule a follow up visit in 3 months but call sooner if needed with CXR and PFT's on return  If cipro x 7 days not adequate call for earlier follow up    10/14/2010 f/u ov/Awa Bachicha maintained on symbicort 80 2bid limited to walking nl pace but can "go anywhere" and able to kayak, throat clearing day more night with  no am exac but cough def worse off MAI rx   Pt denies any significant sore throat, dysphagia, itching, sneezing,  nasal congestion or excess/ purulent secretions,  fever, chills, sweats, unintended wt loss, pleuritic or exertional cp, hempoptysis, orthopnea pnd or leg swelling.    Also denies any obvious fluctuation of symptoms with weather or environmental changes or other aggravating or alleviating factors.     04/19/2011 Started 4 days ago cough,  Productive yellow.  No hemoptysis.  Started cipro 500 twice a day and over the past 4 days not any better.  Has been vomiting some of the med up Notes some chest pain if in a spell.  Notes some pn drip.  Tried mucinex and of no benefit  Past Medical History  Diagnosis Date  . Bronchiectasis   . MAI (mycobacterium avium-intracellulare)   . CAD (coronary artery disease)   . Hyperlipidemia   . HTN (hypertension)   . GERD (gastroesophageal reflux disease)      Family History  Problem Relation Age of Onset  . Cancer Mother     colon  . Hyperlipidemia Mother   . Hypertension Mother   . Heart disease Mother   . Stroke Mother   . Diabetes Mother   . Cancer Father     colon  . Arthritis Father   .  Hyperlipidemia Father   . Hypertension Father   . Heart disease Father   . Stroke Father   . Atopy Neg Hx      History   Social History  . Marital Status: Single    Spouse Name: N/A    Number of Children: N/A  . Years of Education: N/A   Occupational History  . Not on file.   Social History Main Topics  . Smoking status: Former Smoker -- 1.0 packs/day for 20 years    Types: Cigarettes    Quit date: 01/25/1970  . Smokeless tobacco: Never Used  . Alcohol Use: No  . Drug Use: No  . Sexually Active: Not on file   Other Topics Concern  . Not on file   Social History Narrative  . No narrative on file     Allergies  Allergen Reactions  . Codeine     REACTION: nausea  . Erythromycin     REACTION: nausea  . Levofloxacin      REACTION: aches     Outpatient Prescriptions Prior to Visit  Medication Sig Dispense Refill  . amitriptyline (ELAVIL) 75 MG tablet Take 1 tablet (75 mg total) by mouth at bedtime.  90 tablet  1  . aspirin 81 MG tablet Take 81 mg by mouth daily.        Marland Kitchen atorvastatin (LIPITOR) 80 MG tablet Take 1 tablet (80 mg total) by mouth daily.  90 tablet  3  . budesonide-formoterol (SYMBICORT) 160-4.5 MCG/ACT inhaler Inhale 2 puffs into the lungs 2 (two) times daily.  1 Inhaler  12  . calcium carbonate (OS-CAL) 600 MG TABS Take 600 mg by mouth daily.        . carisoprodol (SOMA) 350 MG tablet Take 1 tablet (350 mg total) by mouth daily.  90 tablet  1  . Coenzyme Q10 (COQ10) 200 MG CAPS Take 200 mg by mouth daily.        . folic acid-vitamin b complex-vitamin c-selenium-zinc (DIALYVITE) 3 MG TABS Take 1 tablet by mouth daily.        Marland Kitchen levothyroxine (SYNTHROID, LEVOTHROID) 75 MCG tablet Take 1 tablet (75 mcg total) by mouth daily.  90 tablet  2  . Multiple Vitamin (MULTIVITAMIN) tablet Take 1 tablet by mouth daily.        . nebivolol (BYSTOLIC) 5 MG tablet Take 1 tablet (5 mg total) by mouth daily.  90 tablet  3  . OXYGEN-HELIUM IN Inhale 2.5 L into the lungs at bedtime.       . valsartan (DIOVAN) 160 MG tablet Take 1 tablet (160 mg total) by mouth daily.  90 tablet  3     Review of Systems Constitutional:   No  weight loss, night sweats,  Fevers, chills, fatigue, lassitude. HEENT:   No headaches,  Difficulty swallowing,  Tooth/dental problems,  Sore throat,                No sneezing, itching, ear ache, nasal congestion, post nasal drip,   CV:  No chest pain,  Orthopnea, PND, swelling in lower extremities, anasarca, dizziness, palpitations  GI  No heartburn, indigestion, abdominal pain, nausea, vomiting, diarrhea, change in bowel habits, loss of appetite  Resp: Notes shortness of breath with exertion and  at rest.  Notes  excess mucus, notes  productive cough,  No non-productive cough,  No coughing  up of blood.  Notes  change in color of mucus.  No wheezing.  No chest wall deformity  Skin: no rash or lesions.  GU: no dysuria, change in color of urine, no urgency or frequency.  No flank pain.  MS:  No joint pain or swelling.  No decreased range of motion.  No back pain.  Psych:  No change in mood or affect. No depression or anxiety.  No memory loss.     Objective:   Physical Exam  Filed Vitals:   04/19/11 1541  BP: 142/80  Pulse: 90  Temp: 98.9 F (37.2 C)  TempSrc: Oral  Height: 5\' 2"  (1.575 m)  Weight: 114 lb (51.71 kg)  SpO2: 94%    Gen: Pleasant, well-nourished, in no distress,  normal affect  ENT: No lesions,  mouth clear,  oropharynx clear, no postnasal drip  Neck: No JVD, no TMG, no carotid bruits  Lungs: No use of accessory muscles, no dullness to percussion, scattered rhonchi and poor coarse breath sounds   Cardiovascular: RRR, heart sounds normal, no murmur or gallops, no peripheral edema  Abdomen: soft and NT, no HSM,  BS normal  Musculoskeletal: No deformities, no cyanosis or clubbing  Neuro: alert, non focal  Skin: Warm, no lesions or rashes  Dg Chest 2 View  04/19/2011  *RADIOLOGY REPORT*  Clinical Data: 66 year old female with cough, shortness of breath, upper chest and back pain.  CHEST - 2 VIEW  Comparison: 10/14/2010 and earlier.  Findings: Architectural distortion about both hila is stable and likely there is associated bronchiectasis.  Stable lung volumes. No pneumothorax, pulmonary edema, consolidation, or acute pulmonary opacity. Blunting of the costophrenic sulci appears stable without definite effusion.  Sequelae of CABG.  Stable cardiac size and mediastinal contours.  Visualized tracheal air column is within normal limits.  No acute osseous abnormality identified.  IMPRESSION: Stable chronic changes including bronchiectasis and scarring in the right middle lobe and lingula. No superimposed acute findings are identified.  Original Report  Authenticated By: Harley Hallmark, M.D.         Assessment & Plan:   BRONCHIECTASIS Bronchiectasis with right middle and left lingular involvement now with acute flare Plan Obtain chest x-ray and review>>>see results above>>>no active disease, pt aware  Administer Bactrim double strength one twice a day for 10 days Discontinue Cipro Administer Hycodan cough syrup as needed Maintain inhaled medications as prescribed Return followup with Dr. Sherene Sires in 2 weeks     Updated Medication List Outpatient Encounter Prescriptions as of 04/19/2011  Medication Sig Dispense Refill  . amitriptyline (ELAVIL) 75 MG tablet Take 1 tablet (75 mg total) by mouth at bedtime.  90 tablet  1  . aspirin 81 MG tablet Take 81 mg by mouth daily.        Marland Kitchen atorvastatin (LIPITOR) 80 MG tablet Take 1 tablet (80 mg total) by mouth daily.  90 tablet  3  . budesonide-formoterol (SYMBICORT) 160-4.5 MCG/ACT inhaler Inhale 2 puffs into the lungs 2 (two) times daily.  1 Inhaler  12  . calcium carbonate (OS-CAL) 600 MG TABS Take 600 mg by mouth daily.        . carisoprodol (SOMA) 350 MG tablet Take 1 tablet (350 mg total) by mouth daily.  90 tablet  1  . Coenzyme Q10 (COQ10) 200 MG CAPS Take 200 mg by mouth daily.        . folic acid-vitamin b complex-vitamin c-selenium-zinc (DIALYVITE) 3 MG TABS Take 1 tablet by mouth daily.        Marland Kitchen levothyroxine (SYNTHROID, LEVOTHROID) 75 MCG tablet Take 1 tablet (75 mcg total)  by mouth daily.  90 tablet  2  . Multiple Vitamin (MULTIVITAMIN) tablet Take 1 tablet by mouth daily.        . nebivolol (BYSTOLIC) 5 MG tablet Take 1 tablet (5 mg total) by mouth daily.  90 tablet  3  . OXYGEN-HELIUM IN Inhale 2.5 L into the lungs at bedtime.       . valsartan (DIOVAN) 160 MG tablet Take 1 tablet (160 mg total) by mouth daily.  90 tablet  3  . HYDROcodone-homatropine (HYCODAN) 5-1.5 MG/5ML syrup Take 5 mLs by mouth every 6 (six) hours as needed for cough.  120 mL  0  .  sulfamethoxazole-trimethoprim (BACTRIM DS) 800-160 MG per tablet Take 1 tablet by mouth 2 (two) times daily.  20 tablet  0

## 2011-05-04 NOTE — Progress Notes (Signed)
Subjective:     Patient ID: Belinda Day, female   DOB: [birth year], 66 y.o.   MRN: [id number]  HPI 66 yowf quit smoking 1972 with documented right middle lobe  syndrome and evidence of bronchiectasis by CT scan in March 2002,   08/08/07 FOB with classic cobblestoning and MAI on culture.   08/15/07 given Levaquin x 10 days with resolution bloody mucus, but "felt she had flu the whole time" with aches, feverish   August 68, 2009 ov: first post bronch co still coughing up mucus clear and initiate rx with symbicort/ [facility] FIRST STARTED   October 19, 2007 ov no cough , sob, feeling great but no improvement on cxr   December 07, 2007 ov feeling great, minimal am cough not productive. No sob.   Opth eval, labs ok 10/09   September 06, 2008 ov overall better over the last year, less tendency to exac on zmax and ethambutol. rec complete another year > satisfied improved 90% and stopped zmax and eth 08/2009   November 19, 2009 ov Cough- retrned after d/.c abx in July- prod with clear sputum. breathing ok and no nocturnal co's or early am exac. Levaquin x 750 mg x 5 day cycles for worsening cough/ congestion or discolored sputum  Work on perfecting inhaler technique: and continue symbicort one twice because too shaky on 2bid   March 31, 2010 ov cough worse levaquin not able to tol and didn't help like zmax and ethambutol did and wants to restart. mucus minimally discolored, worse in am, no sign sob. rec 1) Cipro 750 one twice daily for 7 days for cough flare > tol well 2) Call if not better to restart zmax and ehtambutal.  07/16/2010 ov/Nakeda Lebron better,  No cough or sob.  rec Please schedule a follow up visit in 3 months but call sooner if needed with CXR and PFT's on return  If cipro x 7 days not adequate call for earlier follow up    10/14/2010 f/u ov/Vernette Moise maintained on symbicort 80 2bid limited to walking nl pace but can "go anywhere" and able to kayak, throat clearing day more night with no  am exac but cough def worse off MAI rx rec Work on inhaler technique:   Stop metaprolol 50 mg and replace it with bystolic 5mg  one twice daily x 2 weeks to see if it makes any difference in cough or breathing and if so let us know, if not resume the metaprolol   Seen by Delford Field 04/19/11 for severe cough rec Chest xray today Bactrim DS one twice daily for 10days Stop cipro Hycodan cough syrup every 6 hours as needed for cough Stay on symbicort twice daily   05/04/2011 f/u ov/Deirdra Heumann cc cough much better p rx with bactrim, no more purulent sputum, no hemoptysis, no limitign sob  Sleeping ok without nocturnal  or early am exacerbation  of respiratory  c/o's or need for noct saba. Also denies any obvious fluctuation of symptoms with weather or environmental changes or other aggravating or alleviating factors except as outlined above     ROS  At present neg for  any significant sore throat, dysphagia, dental problems, itching, sneezing,  nasal congestion or excess/ purulent secretions, ear ache,   fever, chills, sweats, unintended wt loss, pleuritic or exertional cp, palpitations, orthopnea pnd or leg swelling.  Also denies presyncope, palpitations, heartburn, abdominal pain, anorexia, nausea, vomiting, diarrhea  or change in bowel or urinary habits, change in stools or urine, dysuria,hematuria,  rash, arthralgias, visual complaints, headache, numbness weakness or ataxia or problems with walking or coordination. No noted change in mood/affect or memory.                               Allergies   1) ! * Tussinex  2) ! Levaquin  3) Erythromycin  4) Codeine     Past Medical History:  Bronchiectasis see CT SE 04/13/00  - HFA 75% November 19, 2009  MAI  - Rx Zmax and [facility] 08/29/07 > 08/2009  - Eye eval scheduled 10/09.......................[name]  - Intol of levaquin so try cycles of cipro April 02, 2010 > changed to bactrim 03/2011 HEALTH  MAINTENANCE...........................[name] - Td 10/2007  - Pneumovax 2005 second shot  CAD  Hyperlipidemia  Hypertension  History of cough with ACE inhibition.  Gastroesophageal reflux disease             Objective:   Physical Exam   In general she is a pleasant ambulatory white female in no acute distress.   Wt 130 October 19, 2007>140 March 31, 2010 > 127 07/16/2010 > 10/14/2010  120 > 05/04/2011  117  HEENT: nl dentition, turbinates, and orophanx. Nl external ear canals without cough reflex  Neck without JVD/Nodes/TM  Lungs minimal exp late rhonchi bilaterally with a few insp pops and sqeaks as well  RRR no s3 or murmur or increase in P2  Abd soft and benign with nl excursion in the supine position. No bruits or organomegaly  Ext warm without calf tenderness, cyanosis clubbing or edema   cxr 04/19/11  Stable chronic changes including bronchiectasis and scarring in the  right middle lobe and lingula. No superimposed acute findings are  identified.       Assessment:          Plan:

## 2011-05-04 NOTE — Patient Instructions (Signed)
Ok to use bactrim x 10 days for flare of nasty mucus  Work on inhaler technique:  relax and gently blow all the way out then take a nice smooth deep breath back in, triggering the inhaler at same time you start breathing in.  Hold for up to 5 seconds if you can.  Rinse and gargle with water when done   If your mouth or throat starts to bother you,   I suggest you time the inhaler to your dental care and after using the inhaler(s) brush teeth and tongue with a baking soda containing toothpaste and when you rinse this out, gargle with it first to see if this helps your mouth and throat.     Please schedule a follow up visit in 3 months but call sooner if needed

## 2011-05-05 ENCOUNTER — Encounter: Payer: Self-pay | Admitting: Internal Medicine

## 2011-05-05 NOTE — Assessment & Plan Note (Signed)
-   PFT's 10/14/2010  FEV1  1.25 (68%) and ratio 58% and DLCO 90%     - HFA 50% 05/04/2011   Moderate chronic airflow obtruction assoc with MC dysfunction better p bactrim than cipro so reasonable to change to this as a prn  The proper method of use, as well as anticipated side effects, of a metered-dose inhaler are discussed and demonstrated to the patient. Improved effectiveness after extensive coaching during this visit to a level of approximately  50% and needs to continue to work on this at home with other option = perforomist / bud per neb

## 2011-07-30 ENCOUNTER — Ambulatory Visit (INDEPENDENT_AMBULATORY_CARE_PROVIDER_SITE_OTHER): Payer: Medicare Other | Admitting: Internal Medicine

## 2011-07-30 ENCOUNTER — Encounter: Payer: Self-pay | Admitting: Internal Medicine

## 2011-07-30 VITALS — BP 118/64 | HR 65 | Temp 98.6°F | Ht 62.0 in | Wt 120.4 lb

## 2011-07-30 DIAGNOSIS — J479 Bronchiectasis, uncomplicated: Secondary | ICD-10-CM

## 2011-07-30 DIAGNOSIS — Z8719 Personal history of other diseases of the digestive system: Secondary | ICD-10-CM

## 2011-07-30 MED ORDER — LEVALBUTEROL TARTRATE 45 MCG/ACT IN AERO: 1.0000 | INHALATION_SPRAY | RESPIRATORY_TRACT | Status: DC | PRN

## 2011-07-30 NOTE — Progress Notes (Signed)
Subjective:     Patient ID: Belinda Day, female   DOB: [birth year]    MRN: [id number]  HPI 32 yowf quit smoking 1972 with documented right middle lobe  syndrome and evidence of bronchiectasis by CT scan in March 2002,   08/08/07 FOB with classic cobblestoning and MAI on culture.   08/15/07 given Levaquin x 10 days with resolution bloody mucus, but "felt she had flu the whole time" with aches, feverish   August 29, 2007 ov: first post bronch co still coughing up mucus clear and initiate rx with symbicort/ [facility] AND [facility] FIRST STARTED   October 19, 2007 ov no cough , sob, feeling great but no improvement on cxr   December 07, 2007 ov feeling great, minimal am cough not productive. No sob.   Opth eval, labs ok 10/09   September 06, 2008 ov overall better over the last year, less tendency to exac on zmax and ethambutol. rec complete another year > satisfied improved 90% and stopped zmax and eth 08/2009   November 19, 2009 ov Cough- retrned after d/c abx in July- prod with clear sputum. breathing ok and no nocturnal co's or early am exac. Levaquin x 750 mg x 5 day cycles for worsening cough/ congestion or discolored sputum  Work on perfecting inhaler technique: and continue symbicort one twice because too shaky on 2bid   March 31, 2010 ov cough worse levaquin not able to tol and didn't help like zmax and ethambutol did and wants to restart. mucus minimally discolored, worse in am, no sign sob. rec 1) Cipro 750 one twice daily for 7 days for cough flare > tol well 2) Call if not better to restart zmax and ehtambutal.  07/16/2010 ov/Belinda Day better,  No cough or sob.  rec Please schedule a follow up visit in 3 months but call sooner if needed with CXR and PFT's on return If cipro x 7 days not adequate call for earlier follow up    10/14/2010 f/u ov/Belinda Day maintained on symbicort 80 2bid limited to walking nl pace but can "go anywhere" and able to kayak, throat clearing day more night with no am exac but  cough def worse off MAI rx rec Work on inhaler technique:   Stop metaprolol 50 mg and replace it with bystolic 5mg  one twice daily x 2 weeks to see if it makes any difference in cough or breathing and if so let us know, if not resume the metaprolol   Seen by Delford Field 04/19/11 for severe cough rec  Bactrim DS one twice daily for 10days Stop cipro Hycodan cough syrup every 6 hours as needed for cough Stay on symbicort twice daily   05/04/2011 f/u ov/Belinda Day cc cough much better p rx with bactrim, no more purulent sputum, no hemoptysis, no limiting sob. rec Ok to use bactrim x 10 days for flare of nasty mucus Work on inhaler technique  07/30/2011 f/u ov/Belinda Day cc Patient had a respiratory infection x 2 weeks in June, would like to discuss rescue inhaler > sob and cough back to nl x hoarseness . No limiting doe.   Sleeping ok without nocturnal  or early am exacerbation  of respiratory  c/o's or need for noct saba. Also denies any obvious fluctuation of symptoms with weather or environmental changes or other aggravating or alleviating factors except as outlined above      ROS  The following are not active complaints unless bolded sore throat, dysphagia, dental problems, itching, sneezing,  nasal congestion  or excess/ purulent secretions, ear ache,   fever, chills, sweats, unintended wt loss, pleuritic or exertional cp, hemoptysis,  orthopnea pnd or leg swelling, presyncope, palpitations, heartburn, abdominal pain, anorexia, nausea, vomiting, diarrhea  or change in bowel or urinary habits, change in stools or urine, dysuria,hematuria,  rash, arthralgias, visual complaints, headache, numbness weakness or ataxia or problems with walking or coordination,  change in mood/affect or memory.              Allergies   1) ! * Tussinex  2) ! Levaquin  3) Erythromycin  4) Codeine     Past Medical History:  Bronchiectasis see CT SE 04/13/00  - HFA 75% November 19, 2009  MAI  - Rx Zmax and [facility] 08/29/07 >  08/2009  - Eye eval   10/09.......................[name]  - Intol of levaquin so try cycles of cipro April 02, 2010 > changed to bactrim 03/2011 HEALTH MAINTENANCE...........................[name] - Td 10/2007  - Pneumovax 2005 second shot  CAD  Hyperlipidemia  Hypertension  History of cough with ACE inhibition.  Gastroesophageal reflux disease             Objective:   Physical Exam   In general she is a pleasant ambulatory white female in no acute distress.   Wt 130 October 19, 2007>140 March 31, 2010 > 127 07/16/2010 > 10/14/2010  120 > 05/04/2011  117 > 07/30/2011  120  HEENT: nl dentition, turbinates, and orophanx. Nl external ear canals without cough reflex  Neck without JVD/Nodes/TM  Lungs minimal exp late rhonchi bilaterally with a few insp pops and sqeaks as well  RRR no s3 or murmur or increase in P2  Abd soft and benign with nl excursion in the supine position. No bruits or organomegaly  Ext warm without calf tenderness, cyanosis clubbing or edema   cxr 04/19/11  Stable chronic changes including bronchiectasis and scarring in the  right middle lobe and lingula. No superimposed acute findings are  identified.       Assessment:          Plan:

## 2011-07-30 NOTE — Patient Instructions (Addendum)
mucinex dm up 1200mg  every 12 hours as needed for cough  Try prilosec 20mg   Take 30-60 min before first meal of the day and Pepcid 20 mg one bedtime until cough and hoarseness  are both  is completely gone for at least a week without the need for cough suppression  I think of reflux for chronic cough like I do oxygen for fire (doesn't cause the fire but once you get the oxygen suppressed it usually goes away regardless of the exact cause).   Only use your xopenex hfa as a rescue medication to be used if you can't catch your breath by resting or doing a relaxed purse lip breathing pattern. The less you use it, the better it will work when you need it. You can use it up to every 4 hours if needed but goal is not to need it at all.  Please schedule a follow up visit in 3 months but call sooner if needed with CXR and PFT's

## 2011-07-31 NOTE — Assessment & Plan Note (Addendum)
-   PFT's 10/14/2010  FEV1  1.25 (68%) and ratio 58% and DLCO 90%     - HFA 50% 05/04/2011 > 07/30/2011 90% p coaching  Back to baseline p flare x for persistent hoarsness which may be due to gerd since hfa technique is so good.  Reviewed use of rescue inhaler for flare > xopenex hfa sample given  Will need annual review with pft's cxr to be sure no progression which might indicate a need for more aggressive maint rx including rx for MAI.

## 2011-07-31 NOTE — Assessment & Plan Note (Signed)
Explained natural history of uri / bronchiectasis flare and why it's necessary in patients at risk to treat GERD aggressively  at least  short term   to reduce risk of evolving cyclical cough initially  triggered by epithelial injury and a heightened sensitivty to the effects of any upper airway irritants,  most importantly acid - related.  That is, the more sensitive the epithelium damaged for virus, the more the cough, the more the secondary reflux (especially in those prone to reflux) the more the irritation of the sensitive mucosa and so on in a cyclical pattern.

## 2011-08-01 ENCOUNTER — Other Ambulatory Visit: Payer: Self-pay | Admitting: Internal Medicine

## 2011-08-02 LAB — CBC WITH DIFFERENTIAL/PLATELET
Basophils Absolute: 0 10*3/uL (ref 0.0–0.1)
Basophils Relative: 1 % (ref 0–1)
Eosinophils Absolute: 0.1 10*3/uL (ref 0.0–0.7)
Eosinophils Relative: 1 % (ref 0–5)
HCT: 39.6 % (ref 36.0–46.0)
Lymphocytes Relative: 29 % (ref 12–46)
MCHC: 34.6 g/dL (ref 30.0–36.0)
MCV: 92.1 fL (ref 78.0–100.0)
Monocytes Absolute: 0.4 10*3/uL (ref 0.1–1.0)
Platelets: 295 10*3/uL (ref 150–400)
RDW: 13.6 % (ref 11.5–15.5)
WBC: 5 10*3/uL (ref 4.0–10.5)

## 2011-08-02 LAB — BASIC METABOLIC PANEL
BUN: 18 mg/dL (ref 6–23)
CO2: 26 mEq/L (ref 19–32)
Glucose, Bld: 83 mg/dL (ref 70–99)
Potassium: 4.3 mEq/L (ref 3.5–5.3)

## 2011-08-02 NOTE — Telephone Encounter (Signed)
Pt presented to the lab, future orders released. 

## 2011-08-02 NOTE — Addendum Note (Signed)
Addended by: Mervin Kung A on: 08/02/2011 09:07 AM   Modules accepted: Orders

## 2011-08-02 NOTE — Telephone Encounter (Signed)
Refill left on pharmacy voicemail. 

## 2011-08-02 NOTE — Telephone Encounter (Signed)
90

## 2011-08-02 NOTE — Telephone Encounter (Signed)
Please advise re: carisoprodol. Last refilled 05/05/11 #90. Pt has f/u on 08/10/11.  How many refills may we give patient.

## 2011-08-03 LAB — HEPATIC FUNCTION PANEL
Albumin: 4 g/dL (ref 3.5–5.2)
Indirect Bilirubin: 0.2 mg/dL (ref 0.0–0.9)
Total Protein: 6.2 g/dL (ref 6.0–8.3)

## 2011-08-03 LAB — LIPID PANEL
LDL Cholesterol: 86 mg/dL (ref 0–99)
Triglycerides: 101 mg/dL (ref <150)
VLDL: 20 mg/dL (ref 0–40)

## 2011-08-03 LAB — TSH: TSH: 1.799 u[IU]/mL (ref 0.350–4.500)

## 2011-08-10 ENCOUNTER — Ambulatory Visit (INDEPENDENT_AMBULATORY_CARE_PROVIDER_SITE_OTHER): Payer: Medicare Other | Admitting: Internal Medicine

## 2011-08-10 ENCOUNTER — Encounter: Payer: Self-pay | Admitting: Internal Medicine

## 2011-08-10 VITALS — BP 118/62 | HR 63 | Temp 98.2°F | Resp 14 | Ht 61.5 in | Wt 115.5 lb

## 2011-08-10 DIAGNOSIS — M8580 Other specified disorders of bone density and structure, unspecified site: Secondary | ICD-10-CM

## 2011-08-10 DIAGNOSIS — E785 Hyperlipidemia, unspecified: Secondary | ICD-10-CM

## 2011-08-10 DIAGNOSIS — I10 Essential (primary) hypertension: Secondary | ICD-10-CM

## 2011-08-10 DIAGNOSIS — M949 Disorder of cartilage, unspecified: Secondary | ICD-10-CM

## 2011-08-10 DIAGNOSIS — Z79899 Other long term (current) drug therapy: Secondary | ICD-10-CM

## 2011-08-10 DIAGNOSIS — E039 Hypothyroidism, unspecified: Secondary | ICD-10-CM

## 2011-08-10 NOTE — Progress Notes (Signed)
  Subjective:    Patient ID: [name], female    DOB: 07-10-1945, 66 y.o.   MRN: [id number]  HPI Pt presents to clinic for followup of multiple medical problems. BP reviewed as normotensive. H/o osteopenia without fx hx and no bmd in 2 years. Tolerating statin tx.  Past Medical History  Diagnosis Date  . Bronchiectasis   . MAI (mycobacterium avium-intracellulare)   . CAD (coronary artery disease)   . Hyperlipidemia   . HTN (hypertension)   . GERD (gastroesophageal reflux disease)    Past Surgical History  Procedure Date  . Coronary artery bypass graft 2002    reports that she quit smoking about 41 years ago. Her smoking use included Cigarettes. She has a 20 pack-year smoking history. She has never used smokeless tobacco. She reports that she does not drink alcohol or use illicit drugs. family history includes Arthritis in her father; Cancer in her father and mother; Diabetes in her mother; Heart disease in her father and mother; Hyperlipidemia in her father and mother; Hypertension in her father and mother; and Stroke in her father and mother.  There is no history of Atopy. Allergies  Allergen Reactions  . Ciprofloxacin     Body aches  . Codeine     REACTION: nausea  . Erythromycin     REACTION: nausea  . Levofloxacin     REACTION: aches      Review of Systems see hpi     Objective:   Physical Exam  Physical Exam  Nursing note and vitals reviewed. Constitutional: Appears well-developed and well-nourished. No distress.  HENT:  Head: Normocephalic and atraumatic.  Right Ear: External ear normal.  Left Ear: External ear normal.  Eyes: Conjunctivae are normal. No scleral icterus.  Neck: Neck supple. Carotid bruit is not present.  Cardiovascular: Normal rate, regular rhythm and normal heart sounds.  Exam reveals no gallop and no friction rub.   No murmur heard. Pulmonary/Chest: Effort normal and breath sounds normal. No respiratory distress. He has no wheezes. no  rales.  Lymphadenopathy:    He has no cervical adenopathy.  Neurological:Alert.  Skin: Skin is warm and dry. Not diaphoretic.  Psychiatric: Has a normal mood and affect.        Assessment & Plan:

## 2011-08-10 NOTE — Patient Instructions (Signed)
Please schedule fasting labs prior to next visit chem7-v58.69, vitamin d-osteopenia, lipid/lft-272.4, ua-401.9 and tsh/free t4-hypothyroidism

## 2011-08-14 DIAGNOSIS — M8580 Other specified disorders of bone density and structure, unspecified site: Secondary | ICD-10-CM | POA: Insufficient documentation

## 2011-08-14 DIAGNOSIS — M810 Age-related osteoporosis without current pathological fracture: Secondary | ICD-10-CM

## 2011-08-14 HISTORY — DX: Age-related osteoporosis without current pathological fracture: M81.0

## 2011-08-14 NOTE — Assessment & Plan Note (Signed)
Normotensive and stable. Continue current regimen. Monitor bp as outpt and followup in clinic as scheduled.  

## 2011-08-14 NOTE — Assessment & Plan Note (Signed)
Schedule f/u BMD.

## 2011-09-01 ENCOUNTER — Ambulatory Visit (INDEPENDENT_AMBULATORY_CARE_PROVIDER_SITE_OTHER)
Admission: RE | Admit: 2011-09-01 | Discharge: 2011-09-01 | Disposition: A | Discharge status: Home / Self Care | Payer: Medicare Other | Source: Ambulatory Visit

## 2011-09-01 DIAGNOSIS — M949 Disorder of cartilage, unspecified: Secondary | ICD-10-CM

## 2011-09-01 DIAGNOSIS — M8580 Other specified disorders of bone density and structure, unspecified site: Secondary | ICD-10-CM

## 2011-09-12 ENCOUNTER — Other Ambulatory Visit: Payer: Self-pay | Admitting: Internal Medicine

## 2011-10-06 ENCOUNTER — Ambulatory Visit (INDEPENDENT_AMBULATORY_CARE_PROVIDER_SITE_OTHER): Payer: Medicare Other

## 2011-10-06 DIAGNOSIS — Z23 Encounter for immunization: Secondary | ICD-10-CM

## 2011-10-07 DIAGNOSIS — Z23 Encounter for immunization: Secondary | ICD-10-CM

## 2011-10-29 ENCOUNTER — Ambulatory Visit (INDEPENDENT_AMBULATORY_CARE_PROVIDER_SITE_OTHER)
Admission: RE | Admit: 2011-10-29 | Discharge: 2011-10-29 | Disposition: A | Discharge status: Home / Self Care | Payer: Medicare Other | Source: Ambulatory Visit | Attending: Internal Medicine | Admitting: Internal Medicine

## 2011-10-29 ENCOUNTER — Ambulatory Visit (INDEPENDENT_AMBULATORY_CARE_PROVIDER_SITE_OTHER): Payer: Medicare Other | Admitting: Internal Medicine

## 2011-10-29 ENCOUNTER — Encounter: Payer: Self-pay | Admitting: Internal Medicine

## 2011-10-29 VITALS — BP 102/62 | HR 66 | Temp 97.9°F | Ht 61.0 in | Wt 118.0 lb

## 2011-10-29 DIAGNOSIS — J479 Bronchiectasis, uncomplicated: Secondary | ICD-10-CM

## 2011-10-29 DIAGNOSIS — R05 Cough: Secondary | ICD-10-CM

## 2011-10-29 DIAGNOSIS — Z23 Encounter for immunization: Secondary | ICD-10-CM

## 2011-10-29 LAB — PULMONARY FUNCTION TEST

## 2011-10-29 NOTE — Progress Notes (Signed)
PFT done today. 

## 2011-10-29 NOTE — Progress Notes (Signed)
Subjective:     Patient ID: Belinda Day, female   DOB: Dec 29, 1945    MRN: [id number]  HPI 66 yowf quit smoking 1972 with documented right middle lobe  syndrome and evidence of bronchiectasis by CT scan in March 2002,   08/08/07 FOB with classic cobblestoning and MAI on culture.   08/15/07 given Levaquin x 10 days with resolution bloody mucus, but "felt she had flu the whole time" with aches, feverish   August 29, 2007 ov: first post bronch co still coughing up mucus clear and initiate rx with symbicort/ [facility] STARTED   October 19, 2007 ov no cough , sob, feeling great but no improvement on cxr   December 07, 2007 ov feeling great, minimal am cough not productive. No sob.   Opth eval, labs ok 10/09   September 06, 2008 ov overall better over the last year, less tendency to exac on zmax and ethambutol. rec complete another year > satisfied improved 90% and stopped zmax and eth 08/2009   November 19, 2009 ov Cough- retrned after d/c abx in July- prod with clear sputum. breathing ok and no nocturnal co's or early am exac. Levaquin x 750 mg x 5 day cycles for worsening cough/ congestion or discolored sputum  Work on perfecting inhaler technique: and continue symbicort one twice because too shaky on 2bid   March 31, 2010 ov cough worse levaquin not able to tol and didn't help like zmax and ethambutol did and wants to restart. mucus minimally discolored, worse in am, no sign sob. rec 1) Cipro 750 one twice daily for 7 days for cough flare > tol well 2) Call if not better to restart zmax and ehtambutal.  07/16/2010 ov/Belinda Day better,  No cough or sob.  rec Please schedule a follow up visit in 3 months but call sooner if needed with CXR and PFT's on return If cipro x 7 days not adequate call for earlier follow up    10/14/2010 f/u ov/Belinda Day maintained on symbicort 80 2bid limited to walking nl pace but can "go anywhere" and able to kayak, throat clearing day more night with no am exac but  cough def worse off MAI rx rec Work on inhaler technique:   Stop metaprolol 50 mg and replace it with bystolic 5mg  one twice daily x 2 weeks to see if it makes any difference in cough or breathing and if so let us know, if not resume the metaprolol   Seen by Delford Field 04/19/11 for severe cough rec  Bactrim DS one twice daily for 10days Stop cipro Hycodan cough syrup every 6 hours as needed for cough Stay on symbicort twice daily   05/04/2011 f/u ov/Belinda Day cc cough much better p rx with bactrim, no more purulent sputum, no hemoptysis, no limiting sob. rec Ok to use bactrim x 10 days for flare of nasty mucus Work on inhaler technique  07/30/2011 f/u ov/Belinda Day cc Patient had a respiratory infection x 2 weeks in June, would like to discuss rescue inhaler > sob and cough back to nl x hoarseness . No limiting doe. rec mucinex dm up 1200mg  every 12 hours as needed for cough Try prilosec 20mg   Take 30-60 min before first meal of the day and Pepcid 20 mg one bedtime until cough and hoarseness  are both  is completely gone for at least a week without the need for cough suppression Only use your xopenex hfa as a rescue medication  Please schedule a follow up  visit in 3 months but call sooner if needed with CXR and PFT's  10/29/2011 f/u ov/Belinda Day cc breathing better, every 3 months or so flare of purulent sputum/ fever  Rx bactrim / symbicort 160 2 bid maint  and only only need xopenex 3 times last 3 months with excellent ex tolerance.   Presently  No unusual cough, purulent sputum or sinus/hb symptoms on present rx.    Sleeping ok without nocturnal  or early am exacerbation  of respiratory  c/o's or need for noct saba. Also denies any obvious fluctuation of symptoms with weather or environmental changes or other aggravating or alleviating factors except as outlined above      ROS  The following are not active complaints unless bolded sore throat, dysphagia, dental problems, itching, sneezing,  nasal  congestion or excess/ purulent secretions, ear ache,   fever, chills, sweats, unintended wt loss, pleuritic or exertional cp, hemoptysis,  orthopnea pnd or leg swelling, presyncope, palpitations, heartburn, abdominal pain, anorexia, nausea, vomiting, diarrhea  or change in bowel or urinary habits, change in stools or urine, dysuria,hematuria,  rash, arthralgias, visual complaints, headache, numbness weakness or ataxia or problems with walking or coordination,  change in mood/affect or memory.              Allergies   1) ! * Tussinex  2) ! Levaquin  3) Erythromycin  4) Codeine     Past Medical History:  Bronchiectasis see CT SE 04/13/00  - HFA 75% November 19, 2009  MAI  - Rx Zmax and [facility] 08/29/07 > 08/2009  - Eye eval   10/09.......................[name]  - Intol of levaquin so try cycles of cipro April 02, 2010 > changed to bactrim 03/2011 HEALTH MAINTENANCE...........................[name] - Td 10/2007  - Pneumovax 2005   and 10/29/2011 age 66 CAD  Hyperlipidemia  Hypertension  History of cough with ACE inhibition.  Gastroesophageal reflux disease             Objective:   Physical Exam   In general she is a pleasant ambulatory white female in no acute distress.   Wt 130 October 19, 2007>140 March 31, 2010 > 127 07/16/2010 > 10/14/2010  120 > 05/04/2011  117 > 07/30/2011  120 > 10/29/2011 118  HEENT: nl dentition, turbinates, and orophanx. Nl external ear canals without cough reflex  Neck without JVD/Nodes/TM  Lungs minimal exp late rhonchi bilaterally with a few insp pops and sqeaks as well  RRR no s3 or murmur or increase in P2  Abd soft and benign with nl excursion in the supine position. No bruits or organomegaly  Ext warm without calf tenderness, cyanosis clubbing or edema   cxr 04/19/11  Stable chronic changes including bronchiectasis and scarring in the  right middle lobe and lingula. No superimposed acute findings are  identified.       Assessment:            Plan:

## 2011-10-29 NOTE — Addendum Note (Signed)
Addended by: Darrell Jewel on: 10/29/2011 05:40 PM   Modules accepted: Orders

## 2011-10-29 NOTE — Patient Instructions (Addendum)
No change in medications  Pneumonia shot today is the last one you'll need  Please schedule a follow up visit in 6 months but call sooner if needed

## 2011-10-29 NOTE — Assessment & Plan Note (Signed)
-   PFT's 10/14/2010  FEV1  1.25 (68%) and ratio 58% and DLCO 90%     - PFT's 10/29/2011  FEV1  1.33 (74%) and ratio 57 % and DLCO 93%    - HFA 50% 05/04/2011 > 07/30/2011 90%  Moderated but improved airflow obstruction well controlled symptoms and min exac on present rx reviewed    Each maintenance medication was reviewed in detail including most importantly the difference between maintenance and as needed and under what circumstances the prns are to be used.  Please see instructions for details which were reviewed in writing and the patient given a copy.

## 2011-11-01 ENCOUNTER — Other Ambulatory Visit: Payer: Self-pay | Admitting: Internal Medicine

## 2011-11-02 MED ORDER — CARISOPRODOL 350 MG PO TABS: 350.0000 mg | ORAL_TABLET | Freq: Every day | ORAL | Status: DC

## 2011-11-02 NOTE — Telephone Encounter (Signed)
Rx called in to [place] at [facility]

## 2011-11-02 NOTE — Telephone Encounter (Signed)
90

## 2011-11-04 ENCOUNTER — Encounter: Payer: Self-pay | Admitting: Internal Medicine

## 2011-12-01 ENCOUNTER — Other Ambulatory Visit: Payer: Self-pay | Admitting: Internal Medicine

## 2011-12-28 ENCOUNTER — Encounter: Payer: Self-pay | Admitting: Internal Medicine

## 2011-12-28 ENCOUNTER — Ambulatory Visit (INDEPENDENT_AMBULATORY_CARE_PROVIDER_SITE_OTHER): Payer: Medicare Other | Admitting: Internal Medicine

## 2011-12-28 VITALS — BP 108/64 | HR 64 | Temp 98.0°F | Resp 14 | Wt 125.0 lb

## 2011-12-28 DIAGNOSIS — IMO0001 Reserved for inherently not codable concepts without codable children: Secondary | ICD-10-CM

## 2011-12-28 DIAGNOSIS — M797 Fibromyalgia: Secondary | ICD-10-CM

## 2011-12-28 MED ORDER — TIZANIDINE HCL 4 MG PO TABS: 4.0000 mg | ORAL_TABLET | Freq: Every evening | ORAL | Status: DC | PRN

## 2011-12-28 NOTE — Assessment & Plan Note (Signed)
Due to insurance denial change soma to Zanaflex low-dose each bedtime as needed. Printed prescription provided.

## 2011-12-28 NOTE — Progress Notes (Signed)
  Subjective:    Patient ID: Belinda Day, female    DOB: 19-Jun-1945, 66 y.o.   MRN: [id number]  HPI patient presents to clinic for discussion of potential medication change. Has taken soma nightly for many years both for fibromyalgia as well as assistance in sleep. Tolerates medication without side effects. She's been notified by her insurance company that beginning next month we will no longer cover the medication. Listed alternatives they will cover includes multiple anti-inflammatories and only one muscle relaxer. Patient is attempting to avoid anti-inflammatories  Past Medical History  Diagnosis Date  . Bronchiectasis   . MAI (mycobacterium avium-intracellulare)   . CAD (coronary artery disease)   . Hyperlipidemia   . HTN (hypertension)   . GERD (gastroesophageal reflux disease)    Past Surgical History  Procedure Date  . Coronary artery bypass graft 2002    reports that she quit smoking about 41 years ago. Her smoking use included Cigarettes. She has a 20 pack-year smoking history. She has never used smokeless tobacco. She reports that she does not drink alcohol or use illicit drugs. family history includes Arthritis in her father; Cancer in her father and mother; Diabetes in her mother; Heart disease in her father and mother; Hyperlipidemia in her father and mother; Hypertension in her father and mother; and Stroke in her father and mother.  There is no history of Atopy. Allergies  Allergen Reactions  . Ciprofloxacin     Body aches  . Codeine     REACTION: nausea  . Erythromycin     REACTION: nausea  . Levofloxacin     REACTION: aches     Review of Systems see hpi     Objective:   Physical Exam  Nursing note and vitals reviewed. Constitutional: She appears well-developed and well-nourished. No distress.  Neurological: She is alert.  Skin: She is not diaphoretic.  Psychiatric: She has a normal mood and affect.          Assessment & Plan:

## 2012-01-14 ENCOUNTER — Ambulatory Visit (INDEPENDENT_AMBULATORY_CARE_PROVIDER_SITE_OTHER): Payer: Medicare Other | Admitting: Cardiology

## 2012-01-14 ENCOUNTER — Encounter: Payer: Self-pay | Admitting: Cardiology

## 2012-01-14 VITALS — BP 100/50 | HR 67 | Ht 62.0 in | Wt 123.0 lb

## 2012-01-14 DIAGNOSIS — I2510 Atherosclerotic heart disease of native coronary artery without angina pectoris: Secondary | ICD-10-CM

## 2012-01-14 DIAGNOSIS — E785 Hyperlipidemia, unspecified: Secondary | ICD-10-CM

## 2012-01-14 DIAGNOSIS — I10 Essential (primary) hypertension: Secondary | ICD-10-CM

## 2012-01-14 NOTE — Assessment & Plan Note (Signed)
Blood pressure controlled. Continue present medications. 

## 2012-01-14 NOTE — Assessment & Plan Note (Signed)
Continue aspirin and statin. Schedule Myoview for risk stratification. 

## 2012-01-14 NOTE — Patient Instructions (Addendum)
Your physician wants you to follow-up in: ONE YEAR WITH DR CRENSHAW You will receive a reminder letter in the mail two months in advance. If you don't receive a letter, please call our office to schedule the follow-up appointment.   Your physician has requested that you have a lexiscan myoview. For further information please visit [email or web address]. Please follow instruction sheet, as given.   

## 2012-01-14 NOTE — Assessment & Plan Note (Signed)
Continue statin. Lipids and liver monitored by primary care. 

## 2012-01-14 NOTE — Progress Notes (Signed)
HPI: Belinda Day is a very pleasant female with a history of coronary artery disease status post coronary artery bypass graft in 2002. Her last Myoview in Dec 2010, showed no scar or ischemia, and her ejection fraction was 77%. Carotid Dopplers performed in January of 2012 showed 0-39% stenosis bilaterally. I last saw her in December of 2012. Since then the patient denies any orthopnea, PND, pedal edema, palpitations, syncope or chest pain. She does have dyspnea on exertion from her lung disease.   Current Outpatient Prescriptions  Medication Sig Dispense Refill  . amitriptyline (ELAVIL) 75 MG tablet take 1 tablet by mouth at bedtime  30 tablet  2  . aspirin 81 MG tablet Take 81 mg by mouth daily.        Marland Kitchen atorvastatin (LIPITOR) 80 MG tablet Take 1 tablet (80 mg total) by mouth daily.  90 tablet  3  . calcium carbonate (OS-CAL) 600 MG TABS Take 600 mg by mouth daily.        . carisoprodol (SOMA) 350 MG tablet Take 1 tablet (350 mg total) by mouth daily.  30 tablet  2  . cholecalciferol (VITAMIN D) 1000 UNITS tablet Take 1,000 Units by mouth daily.      Marland Kitchen levalbuterol (XOPENEX HFA) 45 MCG/ACT inhaler Inhale 1-2 puffs into the lungs 2 (two) times daily.      . Multiple Vitamin (MULTIVITAMIN) tablet Take 1 tablet by mouth daily.        . nebivolol (BYSTOLIC) 5 MG tablet Take 1 tablet (5 mg total) by mouth daily.  90 tablet  3  . OXYGEN-HELIUM IN Inhale 2.5 L into the lungs as needed.       . SYMBICORT 160-4.5 MCG/ACT inhaler inhale 2 puffs by mouth twice a day  1 Inhaler  12  . SYNTHROID 75 MCG tablet take 1 tablet by mouth once daily  90 tablet  1  . tiZANidine (ZANAFLEX) 4 MG tablet Take 1 tablet (4 mg total) by mouth at bedtime as needed.  30 tablet  6     Past Medical History  Diagnosis Date  . Bronchiectasis   . MAI (mycobacterium avium-intracellulare)   . CAD (coronary artery disease)   . Hyperlipidemia   . HTN (hypertension)   . GERD (gastroesophageal reflux disease)     Past  Surgical History  Procedure Date  . Coronary artery bypass graft 2002    History   Social History  . Marital Status: Single    Spouse Name: N/A    Number of Children: N/A  . Years of Education: N/A   Occupational History  . Not on file.   Social History Main Topics  . Smoking status: Former Smoker -- 1.0 packs/day for 20 years    Types: Cigarettes    Quit date: 01/25/1970  . Smokeless tobacco: Never Used  . Alcohol Use: No  . Drug Use: No  . Sexually Active: Not on file   Other Topics Concern  . Not on file   Social History Narrative  . No narrative on file    ROS: recent URI but hemoptysis, dysphasia, odynophagia, melena, hematochezia, dysuria, hematuria, rash, seizure activity, orthopnea, PND, pedal edema, claudication. Remaining systems are negative.  Physical Exam: Well-developed well-nourished in no acute distress.  Skin is warm and dry.  HEENT is normal.  Neck is supple.  Chest with expiratory wheeze Cardiovascular exam is regular rate and rhythm.  Abdominal exam nontender or distended. No masses palpated. Extremities show no edema. neuro  grossly intact  ECG Sinus rhythm with nonspecific ST changes

## 2012-01-26 DIAGNOSIS — K5792 Diverticulitis of intestine, part unspecified, without perforation or abscess without bleeding: Secondary | ICD-10-CM

## 2012-01-26 HISTORY — DX: Diverticulitis of intestine, part unspecified, without perforation or abscess without bleeding: K57.92

## 2012-02-01 LAB — LIPID PANEL
Cholesterol: 164 mg/dL (ref 0–200)
HDL: 66 mg/dL (ref >39)
Triglycerides: 108 mg/dL (ref <150)

## 2012-02-01 LAB — BASIC METABOLIC PANEL
Calcium: 9.7 mg/dL (ref 8.4–10.5)
Chloride: 102 mEq/L (ref 96–112)
Creat: 0.91 mg/dL (ref 0.50–1.10)
Sodium: 137 mEq/L (ref 135–145)

## 2012-02-01 LAB — TSH: TSH: 2.667 u[IU]/mL (ref 0.350–4.500)

## 2012-02-01 LAB — HEPATIC FUNCTION PANEL
ALT: 21 U/L (ref 0–35)
Alkaline Phosphatase: 71 U/L (ref 39–117)
Indirect Bilirubin: 0.4 mg/dL (ref 0.0–0.9)
Total Protein: 6.6 g/dL (ref 6.0–8.3)

## 2012-02-01 NOTE — Addendum Note (Signed)
Addended by: Regis Bill on: 02/01/2012 10:04 AM   Modules accepted: Orders

## 2012-02-02 ENCOUNTER — Other Ambulatory Visit: Payer: Self-pay | Admitting: Cardiology

## 2012-02-02 LAB — URINALYSIS, ROUTINE W REFLEX MICROSCOPIC
Bilirubin Urine: NEGATIVE
Protein, ur: NEGATIVE mg/dL
Urobilinogen, UA: 0.2 mg/dL (ref 0.0–1.0)

## 2012-02-03 ENCOUNTER — Other Ambulatory Visit: Payer: Self-pay | Admitting: *Deleted

## 2012-02-03 MED ORDER — ATORVASTATIN CALCIUM 80 MG PO TABS: 80.0000 mg | ORAL_TABLET | Freq: Every day | ORAL | Status: DC

## 2012-02-03 NOTE — Telephone Encounter (Signed)
Refilled Atorvastatin.

## 2012-02-07 ENCOUNTER — Encounter: Payer: Self-pay | Admitting: Internal Medicine

## 2012-02-07 ENCOUNTER — Encounter: Payer: Self-pay | Admitting: Cardiology

## 2012-02-07 ENCOUNTER — Ambulatory Visit (INDEPENDENT_AMBULATORY_CARE_PROVIDER_SITE_OTHER): Payer: Medicare Other | Admitting: Internal Medicine

## 2012-02-07 VITALS — BP 112/68 | HR 70 | Temp 98.1°F | Resp 14 | Ht 62.0 in | Wt 126.0 lb

## 2012-02-07 DIAGNOSIS — I10 Essential (primary) hypertension: Secondary | ICD-10-CM

## 2012-02-07 DIAGNOSIS — E039 Hypothyroidism, unspecified: Secondary | ICD-10-CM

## 2012-02-07 DIAGNOSIS — R42 Dizziness and giddiness: Secondary | ICD-10-CM

## 2012-02-07 MED ORDER — MECLIZINE HCL 25 MG PO TABS: 25.0000 mg | ORAL_TABLET | Freq: Three times a day (TID) | ORAL | Status: DC | PRN

## 2012-02-07 MED ORDER — LEVOTHYROXINE SODIUM 75 MCG PO TABS: 75.0000 ug | ORAL_TABLET | Freq: Every day | ORAL | Status: DC

## 2012-02-07 MED ORDER — AMITRIPTYLINE HCL 75 MG PO TABS: 75.0000 mg | ORAL_TABLET | Freq: Every day | ORAL | Status: DC

## 2012-02-07 MED ORDER — TIZANIDINE HCL 4 MG PO TABS: 4.0000 mg | ORAL_TABLET | Freq: Every evening | ORAL | Status: DC | PRN

## 2012-02-07 NOTE — Progress Notes (Signed)
  Subjective:    Patient ID: Belinda Day, female    DOB: April 25, 1945, 67 y.o.   MRN: [id number]  HPI Pt presents to clinic for followup of multiple medical problems. Notes almost one week h/o vertigo worse with vertical head movement. No recent uri and denies neurologic deficit/sx's such as muscle weakness, numbness, change in vision or facial droop. Recalls past episode of vertigo many years ago helped by antivert. Was evaluated by neurology and appears was felt to be of vestibular etiology at that time.   Past Medical History  Diagnosis Date  . Bronchiectasis   . MAI (mycobacterium avium-intracellulare)   . CAD (coronary artery disease)   . Hyperlipidemia   . HTN (hypertension)   . GERD (gastroesophageal reflux disease)    Past Surgical History  Procedure Date  . Coronary artery bypass graft 2002    reports that she quit smoking about 42 years ago. Her smoking use included Cigarettes. She has a 20 pack-year smoking history. She has never used smokeless tobacco. She reports that she does not drink alcohol or use illicit drugs. family history includes Arthritis in her father; Cancer in her father and mother; Diabetes in her mother; Heart disease in her father and mother; Hyperlipidemia in her father and mother; Hypertension in her father and mother; and Stroke in her father and mother.  There is no history of Atopy. Allergies  Allergen Reactions  . Ciprofloxacin     Body aches  . Codeine     REACTION: nausea  . Erythromycin     REACTION: nausea  . Levofloxacin     REACTION: aches      Review of Systems see hpi     Objective:   Physical Exam  Nursing note and vitals reviewed. Constitutional: She appears well-developed and well-nourished. No distress.  HENT:  Head: Normocephalic and atraumatic.  Right Ear: Tympanic membrane, external ear and ear canal normal.  Left Ear: Tympanic membrane, external ear and ear canal normal.  Eyes: Conjunctivae normal and EOM are normal. No  scleral icterus.  Neck: Neck supple.  Cardiovascular: Normal rate, regular rhythm and normal heart sounds.  Exam reveals no gallop and no friction rub.   No murmur heard. Neurological: She is alert. No cranial nerve deficit. Coordination normal.  Skin: Skin is warm and dry. She is not diaphoretic.  Psychiatric: She has a normal mood and affect.          Assessment & Plan:

## 2012-02-07 NOTE — Assessment & Plan Note (Signed)
Neurologically nonfocal. Suspect vestibular etiology. Attempt antivert prn. Followup if no improvement or worsening.

## 2012-02-07 NOTE — Assessment & Plan Note (Signed)
Stable. Continue current synthroid dosing.

## 2012-02-07 NOTE — Assessment & Plan Note (Signed)
Normotensive and stable. Continue current regimen. Monitor bp as outpt and followup in clinic as scheduled.  

## 2012-02-14 ENCOUNTER — Encounter (HOSPITAL_COMMUNITY): Payer: Medicare Other

## 2012-03-30 ENCOUNTER — Encounter: Payer: Self-pay | Admitting: Internal Medicine

## 2012-04-04 ENCOUNTER — Other Ambulatory Visit: Payer: Self-pay | Admitting: Cardiology

## 2012-04-04 NOTE — Telephone Encounter (Signed)
Pharmacy requesting Diovan, Not on last med list. Called to verify if she's still on this med

## 2012-04-05 NOTE — Telephone Encounter (Signed)
Will route this to Dr. Jens Som nurse for further review. Do not see Diovan on med list

## 2012-04-05 NOTE — Telephone Encounter (Signed)
Follow-up:     Patient called in to state that she is still currently taking Diovan 160MG .  Please call back if you have any questions.

## 2012-04-19 ENCOUNTER — Other Ambulatory Visit: Payer: Self-pay | Admitting: Dermatology

## 2012-04-25 DIAGNOSIS — Z8619 Personal history of other infectious and parasitic diseases: Secondary | ICD-10-CM

## 2012-04-25 HISTORY — DX: Personal history of other infectious and parasitic diseases: Z86.19

## 2012-05-01 ENCOUNTER — Encounter: Payer: Self-pay | Admitting: Internal Medicine

## 2012-05-01 ENCOUNTER — Ambulatory Visit (INDEPENDENT_AMBULATORY_CARE_PROVIDER_SITE_OTHER): Payer: Medicare Other | Admitting: Internal Medicine

## 2012-05-01 VITALS — BP 124/80 | HR 75 | Temp 97.5°F | Ht 62.0 in | Wt 130.0 lb

## 2012-05-01 DIAGNOSIS — J479 Bronchiectasis, uncomplicated: Secondary | ICD-10-CM

## 2012-05-01 DIAGNOSIS — A310 Pulmonary mycobacterial infection: Secondary | ICD-10-CM

## 2012-05-01 NOTE — Progress Notes (Signed)
Subjective:     Patient ID: Belinda Day, female   DOB: [birth year]    MRN: [id number]  HPI 77 yowf quit smoking 1972 with documented right middle lobe  syndrome and evidence of bronchiectasis by CT scan in March 2002,   08/08/07 FOB with classic cobblestoning and MAI on culture.   08/15/07 given Levaquin x 10 days with resolution bloody mucus, but "felt she had flu the whole time" with aches, feverish   August 29, 2007 ov: first post bronch co still coughing up mucus clear and initiate rx with symbicort/ [facility]   October 19, 2007 ov no cough , sob, feeling great but no improvement on cxr   December 07, 2007 ov feeling great, minimal am cough not productive. No sob.   Opth eval, labs ok 10/09   September 06, 2008 ov overall better over the last year, less tendency to exac on zmax and ethambutol. rec complete another year > satisfied improved 90% and stopped zmax and eth 08/2009   November 19, 2009 ov Cough- retrned after d/c abx in July- prod with clear sputum. breathing ok and no nocturnal co's or early am exac. Levaquin x 750 mg x 5 day cycles for worsening cough/ congestion or discolored sputum  Work on perfecting inhaler technique: and continue symbicort one twice because too shaky on 2bid   March 31, 2010 ov cough worse levaquin not able to tol and didn't help like zmax and ethambutol did and wants to restart. mucus minimally discolored, worse in am, no sign sob. rec 1) Cipro 750 one twice daily for 7 days for cough flare > tol well 2) Call if not better to restart zmax and ehtambutal.  07/16/2010 ov/[name] better,  No cough or sob.  rec Please schedule a follow up visit in 3 months but call sooner if needed with CXR and PFT's on return If cipro x 7 days not adequate call for earlier follow up    10/14/2010 f/u ov/[name] maintained on symbicort 80 2bid limited to walking nl pace but can "go anywhere" and able to kayak, throat clearing day more night with no am exac but  cough def worse off MAI rx rec Work on inhaler technique:   Stop metaprolol 50 mg and replace it with bystolic 5mg  one twice daily x 2 weeks to see if it makes any difference in cough or breathing and if so let us know, if not resume the metaprolol   Seen by Delford Field 04/19/11 for severe cough rec  Bactrim DS one twice daily for 10days Stop cipro Hycodan cough syrup every 6 hours as needed for cough Stay on symbicort twice daily   05/04/2011 f/u ov/Belinda Day cc cough much better p rx with bactrim, no more purulent sputum, no hemoptysis, no limiting sob. rec Ok to use bactrim x 10 days for flare of nasty mucus Work on inhaler technique  07/30/2011 f/u ov/Belinda Day cc Patient had a respiratory infection x 2 weeks in June, would like to discuss rescue inhaler > sob and cough back to nl x hoarseness . No limiting doe. rec mucinex dm up 1200mg  every 12 hours as needed for cough Try prilosec 20mg   Take 30-60 min before first meal of the day and Pepcid 20 mg one bedtime until cough and hoarseness  are both  is completely gone for at least a week without the need for cough suppression Only use your xopenex hfa as a rescue medication  Please schedule a follow up  visit in 3 months but call sooner if needed with CXR and PFT's  10/29/2011 f/u ov/Belinda Day cc breathing better, every 3 months or so flare of purulent sputum/ fever  Rx bactrim / symbicort 160 2 bid maint  and only only need xopenex 3 times last 3 months with excellent ex tolerance.  rec No change rx  05/01/2012 f/u ov/Belinda Day for bronchiectasis with airflow obst Chief Complaint  Patient presents with  . Follow-up    Pt states overall doing well, c/o occ dry cough.   worse since worked out in pollen 4/6 but no purulent sputum  No obvious daytime variabilty or assoc  sob or cp or chest tightness, subjective wheeze overt sinus or hb symptoms. No unusual exp hx or h/o childhood pna/ asthma or premature birth to her knowledge.   Has bactrim ds to take prn  hasn't needed it.  Sleeping ok without nocturnal  or early am exacerbation  of respiratory  c/o's or need for noct saba. Also denies any obvious fluctuation of symptoms with weather or environmental changes or other aggravating or alleviating factors except as outlined above      ROS  The following are not active complaints unless bolded sore throat, dysphagia, dental problems, itching, sneezing,  nasal congestion or excess/ purulent secretions, ear ache,   fever, chills, sweats, unintended wt loss, pleuritic or exertional cp, hemoptysis,  orthopnea pnd or leg swelling, presyncope, palpitations, heartburn, abdominal pain, anorexia, nausea, vomiting, diarrhea  or change in bowel or urinary habits, change in stools or urine, dysuria,hematuria,  rash, arthralgias, visual complaints, headache, numbness weakness or ataxia or problems with walking or coordination,  change in mood/affect or memory.              Allergies   1) ! * Tussinex  2) ! Levaquin  3) Erythromycin  4) Codeine     Past Medical History:  Bronchiectasis see CT SE 04/13/00  - HFA 75% November 19, 2009  MAI  - Rx Zmax and [facility] 08/29/07 > 08/2009  - Eye eval   10/09.......................Belinda Day  - Intol of levaquin so try cycles of cipro April 02, 2010 > changed to bactrim 03/2011 HEALTH MAINTENANCE...........................Belinda Day - Td 10/2007  - Pneumovax 2005   and 10/29/2011 age 67 CAD  Hyperlipidemia  Hypertension  History of cough with ACE inhibition.  Gastroesophageal reflux disease             Objective:   Physical Exam   In general she is a pleasant ambulatory white female in no acute distress.   Wt 130 October 19, 2007>140 March 31, 2010 > 127 07/16/2010 > 10/14/2010  120 > 05/04/2011  117 > 07/30/2011  120 > 10/29/2011 118 > 130  05/01/2012   HEENT: nl dentition, turbinates, and orophanx. Nl external ear canals without cough reflex  Neck without JVD/Nodes/TM  Lungs minimal exp late rhonchi bilaterally with a  few insp pops and sqeaks as well  RRR no s3 or murmur or increase in P2  Abd soft and benign with nl excursion in the supine position. No bruits or organomegaly  Ext warm without calf tenderness, cyanosis clubbing or edema   cxr 10/29/11 Stable bilateral middle lobe bronchiectasis and distortion. No  acute cardiopulmonary abnormality        Assessment:          Plan:

## 2012-05-01 NOTE — Patient Instructions (Addendum)
No changes needed in medications for now  Please schedule a follow up visit in 6 months but call sooner if needed

## 2012-05-02 NOTE — Assessment & Plan Note (Signed)
No clinical or radiographic evidence of dz progression off maint rx for MAI

## 2012-05-02 NOTE — Assessment & Plan Note (Signed)
-   PFT's 10/14/2010  FEV1  1.25 (68%) and ratio 58% and DLCO 90%     - PFT's 10/29/2011  FEV1  1.33 (74%) and ratio 57 % and DLCO 93%    - HFA 50% 05/04/2011 > 07/30/2011 90%  Adequate control on present rx, reviewed  Each maintenance medication in detail including most importantly the difference between maintenance and as needed and under what circumstances the prns are to be used.  Please see instructions for details which were reviewed in writing and the patient given a copy.

## 2012-07-30 ENCOUNTER — Other Ambulatory Visit: Payer: Self-pay | Admitting: Internal Medicine

## 2012-07-31 NOTE — Telephone Encounter (Signed)
eScribe request for refill on Zanaflex Last filled - 01.13.14, #90x1 [facility] request for refill on Amitriptyline Last Filled - 01.13.14, #90x1 Last AEX - 01.13.14 [Dr. Hodgin] Please advise on refills/SLS

## 2012-08-01 ENCOUNTER — Ambulatory Visit: Payer: Medicare Other | Admitting: Internal Medicine

## 2012-08-08 ENCOUNTER — Encounter: Payer: Self-pay | Admitting: Family

## 2012-08-08 ENCOUNTER — Ambulatory Visit (INDEPENDENT_AMBULATORY_CARE_PROVIDER_SITE_OTHER): Payer: Medicare Other | Admitting: Family

## 2012-08-08 VITALS — BP 122/62 | HR 74 | Temp 98.5°F | Resp 16 | Ht 62.0 in | Wt 127.1 lb

## 2012-08-08 DIAGNOSIS — E785 Hyperlipidemia, unspecified: Secondary | ICD-10-CM

## 2012-08-08 DIAGNOSIS — I2510 Atherosclerotic heart disease of native coronary artery without angina pectoris: Secondary | ICD-10-CM

## 2012-08-08 DIAGNOSIS — J479 Bronchiectasis, uncomplicated: Secondary | ICD-10-CM

## 2012-08-08 DIAGNOSIS — M797 Fibromyalgia: Secondary | ICD-10-CM

## 2012-08-08 DIAGNOSIS — IMO0001 Reserved for inherently not codable concepts without codable children: Secondary | ICD-10-CM

## 2012-08-08 DIAGNOSIS — R42 Dizziness and giddiness: Secondary | ICD-10-CM

## 2012-08-08 DIAGNOSIS — I10 Essential (primary) hypertension: Secondary | ICD-10-CM

## 2012-08-08 DIAGNOSIS — E039 Hypothyroidism, unspecified: Secondary | ICD-10-CM

## 2012-08-08 NOTE — Assessment & Plan Note (Signed)
Clinically stable on synthroid.  Continue same, obtain bmet.

## 2012-08-08 NOTE — Assessment & Plan Note (Signed)
BP Readings from Last 3 Encounters:  08/08/12 122/62  05/01/12 124/80  02/07/12 112/68   BP stable on current meds.  Obtain bmet.

## 2012-08-08 NOTE — Assessment & Plan Note (Signed)
Resolved

## 2012-08-08 NOTE — Assessment & Plan Note (Signed)
Asymptomatic. Did not complete stress test due to concern re: previous reaction.  I advised her to contact Dr. Jens Som to discuss other alternative options.

## 2012-08-08 NOTE — Assessment & Plan Note (Signed)
Stable per pt on elavil and xanaflex.

## 2012-08-08 NOTE — Patient Instructions (Addendum)
Please complete your lab work prior to leaving. Follow up in 6 months, sooner if problems/concerns.  

## 2012-08-08 NOTE — Progress Notes (Signed)
Subjective:    Patient ID: Belinda Day, female    DOB: [birth year], 67 y.o.   MRN: [id number]  HPI  Belinda Day is a 67 yr old female who presents today for follow up of multiple medical problems.  1) HTN- currenty maintained on bystolic and diovan. Denies CP/SOB  2) Vertigo- resolved.    3) CAD- pt is on aspirin, beta blocker. S/p CABG in 2002. She last saw Dr. Jens Som in 12/13 and it was recommended that she undergo myoview for risk stratification. She declines to complete due to reaction to med that was injected.    4) hypothyroid- maintained on synthroid  5) Hyperlipidemia- maintained on atorvastatin. Denies unusual myalgia on this medication.   6) Bronchiectasis- sees Dr. Sherene Sires who treats her with symbicort HS.  Uses oxygen HS 2-3 nights a week.   Reports that she was placed on amitryptiline in the 80's due to dx of FM- reports that she continues to take as she sleeps much better.  Continues zanaflex.     Review of Systems See HPI  Past Medical History  Diagnosis Date  . Bronchiectasis   . MAI (mycobacterium avium-intracellulare)   . CAD (coronary artery disease)   . Hyperlipidemia   . HTN (hypertension)   . GERD (gastroesophageal reflux disease)     History   Social History  . Marital Status: Single    Spouse Name: N/A    Number of Children: N/A  . Years of Education: N/A   Occupational History  . Not on file.   Social History Main Topics  . Smoking status: Former Smoker -- 1.00 packs/day for 20 years    Types: Cigarettes    Quit date: 01/25/1970  . Smokeless tobacco: Never Used  . Alcohol Use: No  . Drug Use: No  . Sexually Active: Not on file   Other Topics Concern  . Not on file   Social History Narrative  . No narrative on file    Past Surgical History  Procedure Laterality Date  . Coronary artery bypass graft  2002    Family History  Problem Relation Age of Onset  . Cancer Mother     colon  . Hyperlipidemia Mother   . Hypertension  Mother   . Heart disease Mother   . Stroke Mother   . Diabetes Mother   . Cancer Father     colon  . Arthritis Father   . Hyperlipidemia Father   . Hypertension Father   . Heart disease Father   . Stroke Father   . Atopy Neg Hx     Allergies  Allergen Reactions  . Ciprofloxacin     Body aches  . Codeine     REACTION: nausea  . Erythromycin     REACTION: nausea  . Levofloxacin     REACTION: aches    Current Outpatient Prescriptions on File Prior to Visit  Medication Sig Dispense Refill  . amitriptyline (ELAVIL) 75 MG tablet take 1 tablet by mouth at bedtime  90 tablet  0  . aspirin 81 MG tablet Take 81 mg by mouth daily.        Marland Kitchen atorvastatin (LIPITOR) 80 MG tablet Take 1 tablet (80 mg total) by mouth daily.  90 tablet  3  . BYSTOLIC 5 MG tablet take 1 tablet by mouth once daily  90 tablet  3  . DIOVAN 160 MG tablet take 1 tablet by mouth once daily  90 tablet  3  . diphenhydrAMINE (BENADRYL)  25 MG tablet Take 25 mg by mouth at bedtime.      . levalbuterol (XOPENEX HFA) 45 MCG/ACT inhaler Inhale 1-2 puffs into the lungs 2 (two) times daily.      Marland Kitchen levothyroxine (SYNTHROID) 75 MCG tablet Take 1 tablet (75 mcg total) by mouth daily.  90 tablet  1  . Multiple Vitamin (MULTIVITAMIN) tablet Take 1 tablet by mouth daily.        . OXYGEN-HELIUM IN Inhale 2.5 L into the lungs at bedtime.       . sulfamethoxazole-trimethoprim (BACTRIM DS) 800-160 MG per tablet take 1 tablet by mouth twice a day  20 tablet  11  . SYMBICORT 160-4.5 MCG/ACT inhaler inhale 2 puffs by mouth twice a day  1 Inhaler  12  . tiZANidine (ZANAFLEX) 4 MG tablet take 1 tablet by mouth at bedtime if needed  90 tablet  0   No current facility-administered medications on file prior to visit.    There were no vitals taken for this visit.       Objective:   Physical Exam  Constitutional: She is oriented to person, place, and time. She appears well-developed and well-nourished. No distress.  HENT:  Head:  Normocephalic and atraumatic.  Cardiovascular: Normal rate and regular rhythm.   No murmur heard. Pulmonary/Chest: Effort normal.  Few rhonchi noted.    Musculoskeletal: She exhibits no edema.  Lymphadenopathy:    She has no cervical adenopathy.  Neurological: She is alert and oriented to person, place, and time.  Psychiatric: She has a normal mood and affect. Her behavior is normal. Judgment and thought content normal.          Assessment & Plan:

## 2012-08-08 NOTE — Assessment & Plan Note (Signed)
Tolerating atorvastatin. Continue same- Obtain FLP/LFT.

## 2012-08-08 NOTE — Assessment & Plan Note (Signed)
Clinically stable. Management per Dr. Sherene Sires.

## 2012-08-09 ENCOUNTER — Other Ambulatory Visit: Payer: Self-pay | Admitting: Family

## 2012-08-09 LAB — BASIC METABOLIC PANEL WITH GFR
BUN: 17 mg/dL (ref 6–23)
Calcium: 9.3 mg/dL (ref 8.4–10.5)
Creat: 1.16 mg/dL — ABNORMAL HIGH (ref 0.50–1.10)
GFR, Est African American: 56 mL/min — ABNORMAL LOW
Glucose, Bld: 85 mg/dL (ref 70–99)
Potassium: 5.2 mEq/L (ref 3.5–5.3)

## 2012-08-09 LAB — LIPID PANEL
Cholesterol: 154 mg/dL (ref 0–200)
Triglycerides: 80 mg/dL (ref <150)

## 2012-08-09 LAB — HEPATIC FUNCTION PANEL
Albumin: 4.4 g/dL (ref 3.5–5.2)
Total Protein: 7.3 g/dL (ref 6.0–8.3)

## 2012-08-09 LAB — TSH: TSH: 1.788 u[IU]/mL (ref 0.350–4.500)

## 2012-08-09 MED ORDER — LEVOTHYROXINE SODIUM 75 MCG PO TABS: 75.0000 ug | ORAL_TABLET | Freq: Every day | ORAL | Status: DC

## 2012-08-10 ENCOUNTER — Encounter: Payer: Self-pay | Admitting: Family

## 2012-10-02 ENCOUNTER — Ambulatory Visit (INDEPENDENT_AMBULATORY_CARE_PROVIDER_SITE_OTHER): Payer: Medicare Other

## 2012-10-02 DIAGNOSIS — Z23 Encounter for immunization: Secondary | ICD-10-CM

## 2012-10-30 ENCOUNTER — Other Ambulatory Visit: Payer: Self-pay | Admitting: Family Medicine

## 2012-12-12 ENCOUNTER — Encounter: Payer: Self-pay | Admitting: Internal Medicine

## 2012-12-12 ENCOUNTER — Ambulatory Visit (INDEPENDENT_AMBULATORY_CARE_PROVIDER_SITE_OTHER)
Admission: RE | Admit: 2012-12-12 | Discharge: 2012-12-12 | Disposition: A | Discharge status: Home / Self Care | Payer: Medicare Other | Source: Ambulatory Visit | Attending: Internal Medicine | Admitting: Internal Medicine

## 2012-12-12 ENCOUNTER — Ambulatory Visit (INDEPENDENT_AMBULATORY_CARE_PROVIDER_SITE_OTHER): Payer: Medicare Other | Admitting: Internal Medicine

## 2012-12-12 VITALS — BP 126/70 | HR 67 | Temp 97.7°F | Ht 61.0 in | Wt 131.6 lb

## 2012-12-12 DIAGNOSIS — J479 Bronchiectasis, uncomplicated: Secondary | ICD-10-CM

## 2012-12-12 DIAGNOSIS — R0902 Hypoxemia: Secondary | ICD-10-CM

## 2012-12-12 DIAGNOSIS — A310 Pulmonary mycobacterial infection: Secondary | ICD-10-CM

## 2012-12-12 DIAGNOSIS — G4734 Idiopathic sleep related nonobstructive alveolar hypoventilation: Secondary | ICD-10-CM | POA: Insufficient documentation

## 2012-12-12 NOTE — Assessment & Plan Note (Signed)
-  08/08/07 FOB with classic cobblestoning and MAI on culture.  - Rx 08/2007   To 08/2009 with ETH/Zmax  No evidence of sign dz activity clinically nor radiographically

## 2012-12-12 NOTE — Assessment & Plan Note (Signed)
-   PFT's 10/14/2010  FEV1  1.25 (68%) and ratio 58% and DLCO 90%     - PFT's 10/29/2011  FEV1  1.33 (74%) and ratio 57 % and DLCO 93%    - HFA 50% 05/04/2011 > 07/30/2011 90%  Adequate control on present rx, reviewed > no change in rx needed  > f/u 6 months with pfts, continue maint rx with symbicort and reminded re using prn xopenex

## 2012-12-12 NOTE — Progress Notes (Signed)
Subjective:     Patient ID: Belinda Day, female   DOB: 12-23-1945    MRN: [id number]   Brief patient profile:  20 yowf quit smoking 1972 with documented right middle lobe  syndrome and evidence of bronchiectasis by CT scan in March 2002,    History of Present Illness  08/08/07 FOB with classic cobblestoning and MAI on culture.   08/15/07 given Levaquin x 10 days with resolution bloody mucus, but "felt she had flu the whole time" with aches, feverish   August 29, 2007 ov: first post bronch co still coughing up mucus clear and initiate rx with symbicort/ [facility]   October 19, 2007 ov no cough , sob, feeling great but no improvement on cxr   December 07, 2007 ov feeling great, minimal am cough not productive. No sob.   Opth eval, labs ok 10/09   September 06, 2008 ov overall better over the last year, less tendency to exac on zmax and ethambutol. rec complete another year > satisfied improved 90% and stopped zmax and eth 08/2009   November 19, 2009 ov Cough- retrned after d/c abx in July- prod with clear sputum. breathing ok and no nocturnal co's or early am exac. Levaquin x 750 mg x 5 day cycles for worsening cough/ congestion or discolored sputum  Work on perfecting inhaler technique: and continue symbicort one twice because too shaky on 2bid   March 31, 2010 ov cough worse levaquin not able to tol and didn't help like zmax and ethambutol did and wants to restart. mucus minimally discolored, worse in am, no sign sob. rec 1) Cipro 750 one twice daily for 7 days for cough flare > tol well 2) Call if not better to restart zmax and ehtambutal.  07/16/2010 ov/Belinda Day better,  No cough or sob.  rec Please schedule a follow up visit in 3 months but call sooner if needed with CXR and PFT's on return If cipro x 7 days not adequate call for earlier follow up    10/14/2010 f/u ov/Belinda Day maintained on symbicort 80 2bid limited to walking nl pace but can "go anywhere" and able to kayak,  throat clearing day more night with no am exac but cough def worse off MAI rx rec Work on inhaler technique:  Stop metaprolol 50 mg and replace it with bystolic 5mg  one twice daily x 2 weeks to see if it makes any difference in cough or breathing and if so let us know, if not resume the metaprolol     05/04/2011 f/u ov/Belinda Day cc cough much better p rx with bactrim, no more purulent sputum, no hemoptysis, no limiting sob. rec Ok to use bactrim x 10 days for flare of nasty mucus Work on inhaler technique  07/30/2011 f/u ov/Belinda Day cc Patient had a respiratory infection x 2 weeks in June, would like to discuss rescue inhaler > sob and cough back to nl x hoarseness . No limiting doe. rec mucinex dm up 1200mg  every 12 hours as needed for cough Try prilosec 20mg   Take 30-60 min before first meal of the day and Pepcid 20 mg one bedtime until cough and hoarseness  are both  is completely gone for at least a week without the need for cough suppression Only use your xopenex hfa as a rescue medication  Please schedule a follow up visit in 3 months but call sooner if needed with CXR and PFT's  10/29/2011 f/u ov/Belinda Day cc breathing better, every 3 months or so  flare of purulent sputum/ fever  Rx bactrim / symbicort 160 2 bid maint  and only only need xopenex 3 times last 3 months with excellent ex tolerance.  rec No change rx   12/12/2012 f/u ov/Belinda Day re: bronchiectasis c a/f obst on symbicort 160 2 bid Chief Complaint  Patient presents with  . Follow-up    Pt states doing well and denies any new co's today   Has bactrim ds to take prn did one course since last ov Not limited by breathing from any desired activiites, no need for saba  Min am mucus mucus better p coffee.  No obvious daytime variabilty or assoc  sob or cp or chest tightness, subjective wheeze overt sinus or hb symptoms. No unusual exp hx or h/o childhood pna/ asthma or premature birth to her knowledge.   Sleeping ok without nocturnal  or  early am exacerbation  of respiratory  c/o's or need for noct saba. Also denies any obvious fluctuation of symptoms with weather or environmental changes or other aggravating or alleviating factors except as outlined above      ROS  The following are not active complaints unless bolded sore throat, dysphagia, dental problems, itching, sneezing,  nasal congestion or excess/ purulent secretions, ear ache,   fever, chills, sweats, unintended wt loss, pleuritic or exertional cp, hemoptysis,  orthopnea pnd or leg swelling, presyncope, palpitations, heartburn, abdominal pain, anorexia, nausea, vomiting, diarrhea  or change in bowel or urinary habits, change in stools or urine, dysuria,hematuria,  rash, arthralgias, visual complaints, headache, numbness weakness or ataxia or problems with walking or coordination,  change in mood/affect or memory.                  Past Medical History:  Bronchiectasis see CT SE 04/13/00  - HFA 75% November 19, 2009  MAI  - Rx Zmax and [facility] 08/29/07 > 08/2009  - Eye eval   10/09.......................[name]  - Intol of levaquin so try cycles of cipro April 02, 2010 > changed to bactrim 03/2011 HEALTH MAINTENANCE...........................[name] - Td 10/2007  - Pneumovax 2005   and 10/29/2011 age 65 CAD  Hyperlipidemia  Hypertension  History of cough with ACE inhibition.  Gastroesophageal reflux disease             Objective:   Physical Exam   In general she is an extremely  pleasant ambulatory white female in no acute distress.   Wt 130 October 19, 2007>140 March 31, 2010 > 127 07/16/2010 > 10/14/2010  120 > 05/04/2011  117 > 07/30/2011  120 > 10/29/2011 118 > 130  05/01/2012 > 12/12/2012 132   HEENT: nl dentition, turbinates, and orophanx. Nl external ear canals without cough reflex  Neck without JVD/Nodes/TM  Lungs minimal exp late rhonchi bilaterally with a few insp pops and sqeaks as well  RRR no s3 or murmur or increase in P2  Abd soft and benign with nl  excursion in the supine position. No bruits or organomegaly  Ext warm without calf tenderness, cyanosis clubbing or edema      CXR  12/12/2012 :   Areas of scarring. Stable appearing bronchiectatic change right middle lobe. Underlying emphysematous change. No new opacity. No edema or consolidation.        Assessment:

## 2012-12-12 NOTE — Patient Instructions (Addendum)
Please remember to go to the  x-ray department downstairs for your tests - we will call you with the results when they are available.  Keep your xopenex with you when you go out in case you need to use it as needed  Please schedule a follow up visit in 6 months but call sooner if needed with pfts on return

## 2012-12-12 NOTE — Assessment & Plan Note (Signed)
-   12/12/2012  Walked RA x 3 laps @ 185 ft each stopped due to end of study, no sob or desats  rx -= 2.5 lpm at hs prn flare of bronchiectasis  Adequate control on present rx, reviewed > no change in rx needed

## 2012-12-13 ENCOUNTER — Telehealth: Payer: Self-pay | Admitting: Internal Medicine

## 2012-12-13 NOTE — Telephone Encounter (Signed)
Pt is aware of CXR results.

## 2012-12-18 ENCOUNTER — Encounter: Payer: Self-pay | Admitting: Family

## 2012-12-18 ENCOUNTER — Ambulatory Visit (INDEPENDENT_AMBULATORY_CARE_PROVIDER_SITE_OTHER): Payer: Medicare Other | Admitting: Family

## 2012-12-18 VITALS — BP 122/70 | HR 62 | Temp 98.0°F | Resp 16 | Ht 62.0 in | Wt 133.1 lb

## 2012-12-18 DIAGNOSIS — IMO0001 Reserved for inherently not codable concepts without codable children: Secondary | ICD-10-CM

## 2012-12-18 DIAGNOSIS — J479 Bronchiectasis, uncomplicated: Secondary | ICD-10-CM

## 2012-12-18 DIAGNOSIS — M797 Fibromyalgia: Secondary | ICD-10-CM

## 2012-12-18 MED ORDER — AMITRIPTYLINE HCL 50 MG PO TABS: 50.0000 mg | ORAL_TABLET | Freq: Every day | ORAL | Status: DC

## 2012-12-18 NOTE — Progress Notes (Signed)
Subjective:    Patient ID: Belinda Day, female    DOB: [birth year], 67 y.o.   MRN: [id number]  HPI  Belinda Day is a 67 yr old female who presents today to discuss formulary change of her elavil.  This will not be a covered medication in 2015.  She has been on elavil 75 since 1985.  Wishes to try to taper off.  Notes that she has FM symptoms which are worse at night after an active day.     Review of Systems See HPI  Past Medical History  Diagnosis Date  . Bronchiectasis   . MAI (mycobacterium avium-intracellulare)   . CAD (coronary artery disease)   . Hyperlipidemia   . HTN (hypertension)   . GERD (gastroesophageal reflux disease)   . History of shingles 04/2012    History   Social History  . Marital Status: Single    Spouse Name: N/A    Number of Children: N/A  . Years of Education: N/A   Occupational History  . Not on file.   Social History Main Topics  . Smoking status: Former Smoker -- 1.00 packs/day for 20 years    Types: Cigarettes    Quit date: 01/25/1970  . Smokeless tobacco: Never Used  . Alcohol Use: No  . Drug Use: No  . Sexual Activity: Not on file   Other Topics Concern  . Not on file   Social History Narrative  . No narrative on file    Past Surgical History  Procedure Laterality Date  . Coronary artery bypass graft  2002    Family History  Problem Relation Age of Onset  . Cancer Mother     colon  . Hyperlipidemia Mother   . Hypertension Mother   . Heart disease Mother   . Stroke Mother   . Diabetes Mother   . Cancer Father     colon  . Arthritis Father   . Hyperlipidemia Father   . Hypertension Father   . Heart disease Father   . Stroke Father   . Atopy Neg Hx     Allergies  Allergen Reactions  . Ciprofloxacin     Body aches  . Codeine     REACTION: nausea  . Erythromycin     REACTION: nausea  . Levofloxacin     REACTION: aches    Current Outpatient Prescriptions on File Prior to Visit  Medication Sig Dispense  Refill  . aspirin 81 MG tablet Take 81 mg by mouth daily.        Marland Kitchen atorvastatin (LIPITOR) 80 MG tablet Take 1 tablet (80 mg total) by mouth daily.  90 tablet  3  . BYSTOLIC 5 MG tablet take 1 tablet by mouth once daily  90 tablet  3  . DIOVAN 160 MG tablet take 1 tablet by mouth once daily  90 tablet  3  . diphenhydrAMINE (BENADRYL) 25 MG tablet Take 40 mg by mouth at bedtime.       . levalbuterol (XOPENEX HFA) 45 MCG/ACT inhaler Inhale 1-2 puffs into the lungs 2 (two) times daily as needed.       Marland Kitchen levothyroxine (SYNTHROID) 75 MCG tablet Take 1 tablet (75 mcg total) by mouth daily.  90 tablet  1  . Multiple Vitamin (MULTIVITAMIN) tablet Take 1 tablet by mouth daily.        . OXYGEN-HELIUM IN Inhale 2.5 L into the lungs at bedtime as needed.       . SYMBICORT 160-4.5 MCG/ACT  inhaler inhale 2 puffs by mouth twice a day  1 Inhaler  12  . tiZANidine (ZANAFLEX) 4 MG tablet take 1 tablet by mouth at bedtime if needed  90 tablet  0   No current facility-administered medications on file prior to visit.    BP 122/70  Pulse 62  Temp(Src) 98 F (36.7 C) (Oral)  Resp 16  Ht 5\' 2"  (1.575 m)  Wt 133 lb 1.3 oz (60.365 kg)  BMI 24.33 kg/m2  SpO2 97%       Objective:   Physical Exam  Constitutional: She is oriented to person, place, and time. She appears well-developed and well-nourished. No distress.  Cardiovascular: Normal rate and regular rhythm.   No murmur heard. Pulmonary/Chest: Effort normal. No respiratory distress. She has rhonchi in the right lower field. She exhibits no tenderness.  Musculoskeletal: She exhibits no edema.  Neurological: She is alert and oriented to person, place, and time.  Psychiatric: She has a normal mood and affect. Her behavior is normal. Judgment and thought content normal.          Assessment & Plan:

## 2012-12-18 NOTE — Progress Notes (Signed)
Pre visit review using our clinic review tool, if applicable. No additional management support is needed unless otherwise documented below in the visit note. 

## 2012-12-18 NOTE — Assessment & Plan Note (Signed)
Elavil is on the $4 plan at [facility].  Printed rx.  Decrease from 75mg  to 50mg .  Will re-evaluate at her follow up in January.

## 2012-12-18 NOTE — Patient Instructions (Signed)
Decrease amitryptiline to 50mg  once daily. Please follow up in January as scheduled.

## 2012-12-18 NOTE — Assessment & Plan Note (Signed)
Clinically stable. Monitor. 

## 2012-12-21 ENCOUNTER — Encounter (HOSPITAL_COMMUNITY): Payer: Self-pay | Admitting: Emergency Medicine

## 2012-12-21 ENCOUNTER — Emergency Department (HOSPITAL_COMMUNITY): Payer: Medicare Other

## 2012-12-21 ENCOUNTER — Inpatient Hospital Stay (HOSPITAL_COMMUNITY)
Admission: EM | Admit: 2012-12-21 | Discharge: 2012-12-29 | DRG: 378 | Disposition: A | Discharge status: Home / Self Care | Payer: Medicare Other | Attending: Surgery | Admitting: Surgery

## 2012-12-21 DIAGNOSIS — R0902 Hypoxemia: Secondary | ICD-10-CM

## 2012-12-21 DIAGNOSIS — I10 Essential (primary) hypertension: Secondary | ICD-10-CM

## 2012-12-21 DIAGNOSIS — E039 Hypothyroidism, unspecified: Secondary | ICD-10-CM

## 2012-12-21 DIAGNOSIS — Z7982 Long term (current) use of aspirin: Secondary | ICD-10-CM

## 2012-12-21 DIAGNOSIS — E785 Hyperlipidemia, unspecified: Secondary | ICD-10-CM

## 2012-12-21 DIAGNOSIS — K5720 Diverticulitis of large intestine with perforation and abscess without bleeding: Secondary | ICD-10-CM

## 2012-12-21 DIAGNOSIS — Z823 Family history of stroke: Secondary | ICD-10-CM

## 2012-12-21 DIAGNOSIS — K5732 Diverticulitis of large intestine without perforation or abscess without bleeding: Secondary | ICD-10-CM

## 2012-12-21 DIAGNOSIS — M545 Low back pain, unspecified: Secondary | ICD-10-CM | POA: Diagnosis present

## 2012-12-21 DIAGNOSIS — K5900 Constipation, unspecified: Secondary | ICD-10-CM | POA: Diagnosis present

## 2012-12-21 DIAGNOSIS — K625 Hemorrhage of anus and rectum: Secondary | ICD-10-CM

## 2012-12-21 DIAGNOSIS — K5731 Diverticulosis of large intestine without perforation or abscess with bleeding: Principal | ICD-10-CM | POA: Diagnosis present

## 2012-12-21 DIAGNOSIS — G4734 Idiopathic sleep related nonobstructive alveolar hypoventilation: Secondary | ICD-10-CM

## 2012-12-21 DIAGNOSIS — K630 Abscess of intestine: Secondary | ICD-10-CM | POA: Diagnosis present

## 2012-12-21 DIAGNOSIS — K219 Gastro-esophageal reflux disease without esophagitis: Secondary | ICD-10-CM | POA: Diagnosis present

## 2012-12-21 DIAGNOSIS — Z8619 Personal history of other infectious and parasitic diseases: Secondary | ICD-10-CM

## 2012-12-21 DIAGNOSIS — R0789 Other chest pain: Secondary | ICD-10-CM | POA: Diagnosis present

## 2012-12-21 DIAGNOSIS — R112 Nausea with vomiting, unspecified: Secondary | ICD-10-CM | POA: Diagnosis present

## 2012-12-21 DIAGNOSIS — Z833 Family history of diabetes mellitus: Secondary | ICD-10-CM

## 2012-12-21 DIAGNOSIS — K5792 Diverticulitis of intestine, part unspecified, without perforation or abscess without bleeding: Secondary | ICD-10-CM

## 2012-12-21 DIAGNOSIS — Z8249 Family history of ischemic heart disease and other diseases of the circulatory system: Secondary | ICD-10-CM

## 2012-12-21 DIAGNOSIS — I2510 Atherosclerotic heart disease of native coronary artery without angina pectoris: Secondary | ICD-10-CM

## 2012-12-21 DIAGNOSIS — J479 Bronchiectasis, uncomplicated: Secondary | ICD-10-CM

## 2012-12-21 DIAGNOSIS — Z800 Family history of malignant neoplasm of digestive organs: Secondary | ICD-10-CM

## 2012-12-21 DIAGNOSIS — Z87891 Personal history of nicotine dependence: Secondary | ICD-10-CM

## 2012-12-21 DIAGNOSIS — R197 Diarrhea, unspecified: Secondary | ICD-10-CM | POA: Diagnosis present

## 2012-12-21 DIAGNOSIS — Z8679 Personal history of other diseases of the circulatory system: Secondary | ICD-10-CM

## 2012-12-21 DIAGNOSIS — Z951 Presence of aortocoronary bypass graft: Secondary | ICD-10-CM

## 2012-12-21 HISTORY — DX: Hypothyroidism, unspecified: E03.9

## 2012-12-21 HISTORY — DX: Neuralgia and neuritis, unspecified: M79.2

## 2012-12-21 LAB — COMPREHENSIVE METABOLIC PANEL
ALT: 14 U/L (ref 0–35)
AST: 21 U/L (ref 0–37)
Albumin: 3.6 g/dL (ref 3.5–5.2)
CO2: 26 mEq/L (ref 19–32)
Calcium: 9.5 mg/dL (ref 8.4–10.5)
Sodium: 134 mEq/L — ABNORMAL LOW (ref 135–145)
Total Bilirubin: 0.4 mg/dL (ref 0.3–1.2)
Total Protein: 7.7 g/dL (ref 6.0–8.3)

## 2012-12-21 LAB — URINALYSIS, ROUTINE W REFLEX MICROSCOPIC
Glucose, UA: NEGATIVE mg/dL
Hgb urine dipstick: NEGATIVE
Ketones, ur: NEGATIVE mg/dL
Leukocytes, UA: NEGATIVE
Nitrite: NEGATIVE
Protein, ur: NEGATIVE mg/dL
Specific Gravity, Urine: 1.014 (ref 1.005–1.030)

## 2012-12-21 LAB — CBC WITH DIFFERENTIAL/PLATELET
Basophils Absolute: 0 10*3/uL (ref 0.0–0.1)
Basophils Relative: 0 % (ref 0–1)
Eosinophils Absolute: 0 10*3/uL (ref 0.0–0.7)
Eosinophils Relative: 0 % (ref 0–5)
Lymphocytes Relative: 6 % — ABNORMAL LOW (ref 12–46)
MCV: 93.6 fL (ref 78.0–100.0)
Neutro Abs: 15 10*3/uL — ABNORMAL HIGH (ref 1.7–7.7)
Platelets: 254 10*3/uL (ref 150–400)
RDW: 12.3 % (ref 11.5–15.5)
WBC: 17.2 10*3/uL — ABNORMAL HIGH (ref 4.0–10.5)

## 2012-12-21 MED ORDER — LEVALBUTEROL TARTRATE 45 MCG/ACT IN AERO
1.0000 | INHALATION_SPRAY | Freq: Two times a day (BID) | RESPIRATORY_TRACT | Status: DC | PRN
  Filled 2012-12-21: qty 15

## 2012-12-21 MED ORDER — KCL IN DEXTROSE-NACL 20-5-0.45 MEQ/L-%-% IV SOLN
INTRAVENOUS | Status: DC
  Administered 2012-12-21 – 2012-12-23 (×3): via INTRAVENOUS
  Administered 2012-12-24: 110 mL/h via INTRAVENOUS
  Administered 2012-12-25 – 2012-12-26 (×2): via INTRAVENOUS
  Filled 2012-12-21 (×18): qty 1000

## 2012-12-21 MED ORDER — BUDESONIDE-FORMOTEROL FUMARATE 160-4.5 MCG/ACT IN AERO
2.0000 | INHALATION_SPRAY | Freq: Two times a day (BID) | RESPIRATORY_TRACT | Status: DC
  Administered 2012-12-21 – 2012-12-29 (×15): 2 via RESPIRATORY_TRACT
  Filled 2012-12-21: qty 6

## 2012-12-21 MED ORDER — DIAZEPAM 5 MG/ML IJ SOLN
5.0000 mg | Freq: Once | INTRAMUSCULAR | Status: AC
Start: 2012-12-21 — End: 2012-12-21
  Administered 2012-12-21: 5 mg via INTRAVENOUS
  Filled 2012-12-21: qty 2

## 2012-12-21 MED ORDER — LEVOTHYROXINE SODIUM 75 MCG PO TABS
75.0000 ug | ORAL_TABLET | Freq: Every day | ORAL | Status: DC
  Administered 2012-12-22 – 2012-12-29 (×8): 75 ug via ORAL
  Filled 2012-12-21 (×9): qty 1

## 2012-12-21 MED ORDER — IOHEXOL 300 MG/ML  SOLN
100.0000 mL | Freq: Once | INTRAMUSCULAR | Status: AC | PRN
  Administered 2012-12-21: 100 mL via INTRAVENOUS

## 2012-12-21 MED ORDER — ATORVASTATIN CALCIUM 80 MG PO TABS
80.0000 mg | ORAL_TABLET | Freq: Every day | ORAL | Status: DC
  Administered 2012-12-23 – 2012-12-29 (×7): 80 mg via ORAL
  Filled 2012-12-21 (×8): qty 1

## 2012-12-21 MED ORDER — ONDANSETRON HCL 4 MG/2ML IJ SOLN
4.0000 mg | Freq: Four times a day (QID) | INTRAMUSCULAR | Status: DC | PRN
  Administered 2012-12-22 (×2): 4 mg via INTRAVENOUS
  Filled 2012-12-21 (×2): qty 2

## 2012-12-21 MED ORDER — IRBESARTAN 150 MG PO TABS
150.0000 mg | ORAL_TABLET | Freq: Every day | ORAL | Status: DC
  Administered 2012-12-22 – 2012-12-29 (×8): 150 mg via ORAL
  Filled 2012-12-21 (×9): qty 1

## 2012-12-21 MED ORDER — PIPERACILLIN-TAZOBACTAM 3.375 G IVPB
3.3750 g | Freq: Three times a day (TID) | INTRAVENOUS | Status: DC
  Administered 2012-12-21 – 2012-12-28 (×20): 3.375 g via INTRAVENOUS
  Filled 2012-12-21 (×24): qty 50

## 2012-12-21 MED ORDER — IOHEXOL 300 MG/ML  SOLN
50.0000 mL | Freq: Once | INTRAMUSCULAR | Status: AC | PRN
  Administered 2012-12-21: 50 mL via ORAL

## 2012-12-21 MED ORDER — AMITRIPTYLINE HCL 50 MG PO TABS
50.0000 mg | ORAL_TABLET | Freq: Every day | ORAL | Status: DC
  Administered 2012-12-21 – 2012-12-28 (×7): 50 mg via ORAL
  Filled 2012-12-21 (×9): qty 1

## 2012-12-21 MED ORDER — NEBIVOLOL HCL 5 MG PO TABS
5.0000 mg | ORAL_TABLET | Freq: Every day | ORAL | Status: DC
  Administered 2012-12-22 – 2012-12-29 (×8): 5 mg via ORAL
  Filled 2012-12-21 (×9): qty 1

## 2012-12-21 MED ORDER — HEPARIN SODIUM (PORCINE) 5000 UNIT/ML IJ SOLN
5000.0000 [IU] | Freq: Three times a day (TID) | INTRAMUSCULAR | Status: DC
  Administered 2012-12-21 – 2012-12-28 (×20): 5000 [IU] via SUBCUTANEOUS
  Filled 2012-12-21 (×24): qty 1

## 2012-12-21 MED ORDER — MORPHINE SULFATE 4 MG/ML IJ SOLN
4.0000 mg | Freq: Once | INTRAMUSCULAR | Status: DC
  Filled 2012-12-21: qty 1

## 2012-12-21 MED ORDER — PANTOPRAZOLE SODIUM 40 MG IV SOLR
40.0000 mg | Freq: Every day | INTRAVENOUS | Status: DC
  Administered 2012-12-21 – 2012-12-27 (×7): 40 mg via INTRAVENOUS
  Filled 2012-12-21 (×8): qty 40

## 2012-12-21 MED ORDER — ATORVASTATIN CALCIUM 80 MG PO TABS: 80.0000 mg | ORAL_TABLET | Freq: Every day | ORAL | Status: DC

## 2012-12-21 MED ORDER — MORPHINE SULFATE 2 MG/ML IJ SOLN
1.0000 mg | INTRAMUSCULAR | Status: DC | PRN
  Administered 2012-12-21 – 2012-12-22 (×3): 2 mg via INTRAVENOUS
  Filled 2012-12-21 (×4): qty 1

## 2012-12-21 MED ORDER — SODIUM CHLORIDE 0.9 % IV BOLUS (SEPSIS)
500.0000 mL | Freq: Once | INTRAVENOUS | Status: AC
  Administered 2012-12-21: 500 mL via INTRAVENOUS

## 2012-12-21 NOTE — ED Notes (Signed)
Pt from home reports LLQ pain that radiates to L flank x3 days. Pt denies N/V/D, fever, dysuria. Pt has hx of IBS several years ago. Pt is A&O and in NAD

## 2012-12-21 NOTE — ED Provider Notes (Signed)
Medical screening examination/treatment/procedure(s) were conducted as a shared visit with non-physician practitioner(s) and myself.  I personally evaluated the patient during the encounter.  EKG Interpretation    Date/Time:    Ventricular Rate:    PR Interval:    QRS Duration:   QT Interval:    QTC Calculation:   R Axis:     Text Interpretation:               Patient here with 3 days of progressive L flank pain. No fevers, no N/V/D. Hx of constipation. L flank TTP with some voluntary guarding. CT shows diverticulitis with possible perf and possible early abscess. Surgery consulted, admitting.  Dagmar Hait, MD 12/21/12 2036

## 2012-12-21 NOTE — H&P (Signed)
Reason for Consult:diverticulitis Referring Physician: Fayrene Helper, PA  Belinda Day is an 67 y.o. female.  HPI: I was asked to evaluate this patient for diverticulitis. She comes into the emergency room today complaining of a three-day history of increasing abdominal pain located in the left upper quadrant. She says that this began 3 days ago of this just the mild discomfort which has steadily progressed over the last 3 days to now being pain of 8/10 severity which feels like a hot knife is twisting in her left upper quadrant with radiation to her left back. She denies any fevers or chills or nausea or vomiting or associated symptoms. She denies any history of peptic ulcer disease or reflux. She does have a history of known diverticulosis and is followed by Dr. Kinnie Scales with colonoscopies every 3 years because she has a strong family history of colon cancer. She said that she is current on her colonoscopy. She says that she is normally constipated and takes milk of magnesia for relief with her last bowel movement being 2 days ago. She denies any blood in her stools or melena. She denies any pain with urination or hematuria. She does have a history of three-vessel bypass in 2000 but is followed by Dr. Jens Som annually for this and denies any recurrent heart problems or chest pains.  Past Medical History  Diagnosis Date  . Bronchiectasis   . MAI (mycobacterium avium-intracellulare)   . CAD (coronary artery disease)   . Hyperlipidemia   . HTN (hypertension)   . GERD (gastroesophageal reflux disease)   . History of shingles 04/2012    Past Surgical History  Procedure Laterality Date  . Coronary artery bypass graft  2002    Family History  Problem Relation Age of Onset  . Cancer Mother     colon  . Hyperlipidemia Mother   . Hypertension Mother   . Heart disease Mother   . Stroke Mother   . Diabetes Mother   . Cancer Father     colon  . Arthritis Father   . Hyperlipidemia Father   .  Hypertension Father   . Heart disease Father   . Stroke Father   . Atopy Neg Hx     Social History:  reports that she quit smoking about 42 years ago. Her smoking use included Cigarettes. She has a 20 pack-year smoking history. She has never used smokeless tobacco. She reports that she does not drink alcohol or use illicit drugs.  Allergies:  Allergies  Allergen Reactions  . Ciprofloxacin     Body aches  . Codeine     REACTION: nausea  . Erythromycin     REACTION: nausea  . Levofloxacin     REACTION: aches    Medications: I have reviewed the patient's current medications.  Results for orders placed during the hospital encounter of 12/21/12 (from the past 48 hour(s))  URINALYSIS, ROUTINE W REFLEX MICROSCOPIC     Status: None   Collection Time    12/21/12  2:49 PM      Result Value Range   Color, Urine YELLOW  YELLOW   APPearance CLEAR  CLEAR   Specific Gravity, Urine 1.014  1.005 - 1.030   pH 7.0  5.0 - 8.0   Glucose, UA NEGATIVE  NEGATIVE mg/dL   Hgb urine dipstick NEGATIVE  NEGATIVE   Bilirubin Urine NEGATIVE  NEGATIVE   Ketones, ur NEGATIVE  NEGATIVE mg/dL   Protein, ur NEGATIVE  NEGATIVE mg/dL   Urobilinogen, UA  0.2  0.0 - 1.0 mg/dL   Nitrite NEGATIVE  NEGATIVE   Leukocytes, UA NEGATIVE  NEGATIVE   Comment: MICROSCOPIC NOT DONE ON URINES WITH NEGATIVE PROTEIN, BLOOD, LEUKOCYTES, NITRITE, OR GLUCOSE <1000 mg/dL.  CBC WITH DIFFERENTIAL     Status: Abnormal   Collection Time    12/21/12  2:55 PM      Result Value Range   WBC 17.2 (*) 4.0 - 10.5 K/uL   RBC 4.54  3.87 - 5.11 MIL/uL   Hemoglobin 14.7  12.0 - 15.0 g/dL   HCT 16.1  09.6 - 04.5 %   MCV 93.6  78.0 - 100.0 fL   MCH 32.4  26.0 - 34.0 pg   MCHC 34.6  30.0 - 36.0 g/dL   RDW 40.9  81.1 - 91.4 %   Platelets 254  150 - 400 K/uL   Neutrophils Relative % 88 (*) 43 - 77 %   Neutro Abs 15.0 (*) 1.7 - 7.7 K/uL   Lymphocytes Relative 6 (*) 12 - 46 %   Lymphs Abs 1.1  0.7 - 4.0 K/uL   Monocytes Relative 6  3 - 12  %   Monocytes Absolute 1.1 (*) 0.1 - 1.0 K/uL   Eosinophils Relative 0  0 - 5 %   Eosinophils Absolute 0.0  0.0 - 0.7 K/uL   Basophils Relative 0  0 - 1 %   Basophils Absolute 0.0  0.0 - 0.1 K/uL  COMPREHENSIVE METABOLIC PANEL     Status: Abnormal   Collection Time    12/21/12  2:55 PM      Result Value Range   Sodium 134 (*) 135 - 145 mEq/L   Potassium 4.3  3.5 - 5.1 mEq/L   Chloride 97  96 - 112 mEq/L   CO2 26  19 - 32 mEq/L   Glucose, Bld 122 (*) 70 - 99 mg/dL   BUN 18  6 - 23 mg/dL   Creatinine, Ser 7.82  0.50 - 1.10 mg/dL   Calcium 9.5  8.4 - 95.6 mg/dL   Total Protein 7.7  6.0 - 8.3 g/dL   Albumin 3.6  3.5 - 5.2 g/dL   AST 21  0 - 37 U/L   ALT 14  0 - 35 U/L   Alkaline Phosphatase 84  39 - 117 U/L   Total Bilirubin 0.4  0.3 - 1.2 mg/dL   GFR calc non Af Amer 67 (*) >90 mL/min   GFR calc Af Amer 78 (*) >90 mL/min   Comment: (NOTE)     The eGFR has been calculated using the CKD EPI equation.     This calculation has not been validated in all clinical situations.     eGFR's persistently <90 mL/min signify possible Chronic Kidney     Disease.  LIPASE, BLOOD     Status: None   Collection Time    12/21/12  2:55 PM      Result Value Range   Lipase 31  11 - 59 U/L    Ct Abdomen Pelvis W Contrast  12/21/2012   CLINICAL DATA:  Left upper quadrant pain  EXAM: CT ABDOMEN AND PELVIS WITH CONTRAST  TECHNIQUE: Multidetector CT imaging of the abdomen and pelvis was performed using the standard protocol following bolus administration of intravenous contrast.  CONTRAST:  OMNIPAQUE IOHEXOL 300 MG/ML  SOLN  COMPARISON:  None.  FINDINGS: Lung bases shows scarring and mild bronchiectasis in right lower lobe posterior medially.  Sagittal images of the spine  are unremarkable. No calcified gallstones are noted within gallbladder. Enhanced liver, spleen, pancreas and adrenal glands are unremarkable. Enhanced kidneys are symmetrical in size. No hydronephrosis or hydroureter. Delayed renal  images shows bilateral renal symmetrical excretion. There is a small cyst in lower pole of the right kidney measures 8 mm. Atherosclerotic calcifications are noted abdominal aorta and iliac arteries.  Abundant stool noted in right colon. No pericecal inflammation. Normal appendix clearly visualized in axial image 52. Scattered diverticula are noted in left colon. There is significant stranding of pericolonic fat in left upper abdomen just anterior to the spleen, splenic flexure of the colon. There is significant thickening of diverticular wall. Findings are consistent with significant acute diverticulitis. This is best visualized in axial image 12 and sagittal image 83. There are small pockets of probable extraluminal air suspicious for a contained perforation. Mixed collection in axial image 13 containing fecal like material and some air suspicious for early diverticular abscess. There is focal thickening of colonic wall in axial image 18  Follow-up colonoscopy after appropriate treatment is recommended.  The uterus and adnexa are unremarkable. The urinary bladder is unremarkable. No pelvic ascites or adenopathy. No small bowel obstruction.  IMPRESSION: 1. 1. Scattered diverticula are noted in left colon. There is significant stranding of pericolonic fat in left upper abdomen just anterior to the spleen, splenic flexure of the colon. There is significant thickening of diverticular wall. Findings are consistent with significant acute diverticulitis. This is best visualized in axial image 12 and sagittal image 83. There are small pockets of probable extraluminal air suspicious for a contained perforation. Mixed collection in axial image 13 containing fecal like material and some air suspicious for early diverticular abscess. There is focal thickening of colonic wall in axial image 18  Follow-up colonoscopy after appropriate treatment is recommended.  2. No pericecal inflammation. Normal appendix. No hydronephrosis or  hydroureter.   Electronically Signed   By: Natasha Mead M.D.   On: 12/21/2012 16:43    ROS All other review of systems negative or noncontributory except as stated in the HPI  Blood pressure 126/62, pulse 76, temperature 98.8 F (37.1 C), temperature source Oral, resp. rate 18, SpO2 95.00%. General appearance: alert, cooperative and no distress Resp: clear to auscultation bilaterally Cardio: normal rate, regular GI: soft, focal LUQ tenderness, no LLQ or right sided tenderness, ND, no peritoneal signs. Extremities: extremities normal, atraumatic, no cyanosis or edema Pulses: 2+ and symmetric Skin: Skin color, texture, turgor normal. No rashes or lesions Neurologic: Grossly normal  Assessment/Plan: Diverticulitis with likely microperforation and possible abscess This does sound like diverticulitis and draped CT scan is consistent with this. This is located more at the splenic flexure instead of the usual sigmoid diverticulitis. She may have a small perforation or possible developing abscess but she does not have peritonitis and only has focal mild to moderate tenderness in the left upper quadrant and I do not see any evidence of need for emergent laparotomy and colostomy at this time. I did discuss this option with the patient and we discussed the pathophysiology of this disease and the possible scenarios and range of diverticulitis from mild diverticulitis up to free perforation and the need for ostomy. Given her exam, stable hemodynamics, and CT scan, I think that it is reasonable to attempt nonoperative management with IV antibiotics and bowel rest. I did explain to her that if she does not improve daily, then we will need to possibly treat her with exploratory surgery and possible  colostomy. Given her pulmonary and cardiac issues, we will ask for hospitalist consult as well.  Otherwise plan for admission.  Lodema Pilot DAVID 12/21/2012, 6:56 PM

## 2012-12-21 NOTE — ED Provider Notes (Signed)
CSN: [id number]     Arrival date & time 12/21/12  1349 History   First MD Initiated Contact with Patient 12/21/12 1413     Chief Complaint  Patient presents with  . Abdominal Pain  . Flank Pain    HPI  Belinda Day is a 67 y.o. female with a PMH of CAD, HTN, HLD, and GERD who presents to the ED for evaluation of abdominal and flank pain.  History was provided by the patient.  Patient states that she has had abdominal pain for the past 3 days. Her pain is located in the left lower quadrant with radiation to her left flank and left back. Her pain is constant and is described as a dull sensation with intermittent sharp pain. She states she has tried erythromycin from a previous lung infection and Tylenol with some relief in her pain. She denies any previous similar symptoms in the past. No previous abdominal surgeries. She has not had a bowel movement in the past 2 days. She has had flatulence.  No nausea, vomiting, dysuria, rectal bleeding, vaginal bleeding, vaginal discharge, diarrhea, fever, change or in appetite or activity.  She otherwise been well with no rhinorrhea, cough, chest pain, difficulty breathing, shortness of breath, headache, weakness, or dizziness/lightheadedness.        Past Medical History  Diagnosis Date  . Bronchiectasis   . MAI (mycobacterium avium-intracellulare)   . CAD (coronary artery disease)   . Hyperlipidemia   . HTN (hypertension)   . GERD (gastroesophageal reflux disease)   . History of shingles 04/2012   Past Surgical History  Procedure Laterality Date  . Coronary artery bypass graft  2002   Family History  Problem Relation Age of Onset  . Cancer Mother     colon  . Hyperlipidemia Mother   . Hypertension Mother   . Heart disease Mother   . Stroke Mother   . Diabetes Mother   . Cancer Father     colon  . Arthritis Father   . Hyperlipidemia Father   . Hypertension Father   . Heart disease Father   . Stroke Father   . Atopy Neg Hx     History  Substance Use Topics  . Smoking status: Former Smoker -- 1.00 packs/day for 20 years    Types: Cigarettes    Quit date: 01/25/1970  . Smokeless tobacco: Never Used  . Alcohol Use: No   OB History   Grav Para Term Preterm Abortions TAB SAB Ect Mult Living                 Review of Systems  Constitutional: Negative for fever, chills, diaphoresis, activity change, appetite change and fatigue.  HENT: Negative for congestion, rhinorrhea and sore throat.   Eyes: Negative for photophobia and visual disturbance.  Respiratory: Negative for cough, shortness of breath and wheezing.   Cardiovascular: Negative for chest pain and leg swelling.  Gastrointestinal: Positive for abdominal pain and constipation. Negative for nausea, vomiting, diarrhea, blood in stool and abdominal distention.  Genitourinary: Positive for flank pain. Negative for dysuria, hematuria, decreased urine volume, vaginal bleeding, vaginal discharge, difficulty urinating, genital sores, vaginal pain and pelvic pain.  Musculoskeletal: Positive for back pain. Negative for gait problem, joint swelling, myalgias and neck pain.  Skin: Negative for wound.  Neurological: Negative for dizziness, weakness, light-headedness, numbness and headaches.    Allergies  Ciprofloxacin; Codeine; Erythromycin; and Levofloxacin  Home Medications   Current Outpatient Rx  Name  Route  Sig  Dispense  Refill  . acetaminophen (TYLENOL) 650 MG CR tablet      every 8 (eight) hours as needed for pain. 1 - 2 tablets daily for pain.         Marland Kitchen amitriptyline (ELAVIL) 50 MG tablet   Oral   Take 1 tablet (50 mg total) by mouth at bedtime.   90 tablet   1   . aspirin 81 MG tablet   Oral   Take 81 mg by mouth daily.          Marland Kitchen atorvastatin (LIPITOR) 80 MG tablet   Oral   Take 1 tablet (80 mg total) by mouth daily.   90 tablet   3   . budesonide-formoterol (SYMBICORT) 160-4.5 MCG/ACT inhaler   Inhalation   Inhale 2 puffs into  the lungs 2 (two) times daily.         . diphenhydrAMINE (BENADRYL) 25 MG tablet   Oral   Take 40 mg by mouth at bedtime.          Marland Kitchen levothyroxine (SYNTHROID, LEVOTHROID) 75 MCG tablet   Oral   Take 75 mcg by mouth daily.         . Multiple Vitamin (MULTIVITAMIN) tablet   Oral   Take 1 tablet by mouth daily.          . nebivolol (BYSTOLIC) 5 MG tablet   Oral   Take 5 mg by mouth daily.         . OXYGEN-HELIUM IN   Inhalation   Inhale 2.5 L into the lungs at bedtime as needed.          Marland Kitchen tiZANidine (ZANAFLEX) 4 MG tablet      take 1 tablet by mouth at bedtime if needed         . valsartan (DIOVAN) 160 MG tablet   Oral   Take 160 mg by mouth daily.         Marland Kitchen levalbuterol (XOPENEX HFA) 45 MCG/ACT inhaler   Inhalation   Inhale 1-2 puffs into the lungs 2 (two) times daily as needed for wheezing or shortness of breath.           BP 133/52  Pulse 103  Temp(Src) 98.8 F (37.1 C) (Oral)  Resp 20  SpO2 97%  Filed Vitals:   12/21/12 1358  BP: 133/52  Pulse: 103  Temp: 98.8 F (37.1 C)  TempSrc: Oral  Resp: 20  SpO2: 97%     Physical Exam  Nursing note and vitals reviewed. Constitutional: She is oriented to person, place, and time. She appears well-developed and well-nourished. No distress.  HENT:  Head: Normocephalic and atraumatic.  Right Ear: External ear normal.  Left Ear: External ear normal.  Nose: Nose normal.  Mouth/Throat: Oropharynx is clear and moist.  Eyes: Conjunctivae are normal. Right eye exhibits no discharge. Left eye exhibits no discharge.  Neck: Normal range of motion. Neck supple.  Cardiovascular: Normal rate, regular rhythm, normal heart sounds and intact distal pulses.  Exam reveals no gallop and no friction rub.   No murmur heard. Pulmonary/Chest: Effort normal and breath sounds normal. No respiratory distress. She has no wheezes. She has no rales. She exhibits no tenderness.  Abdominal: Soft. Bowel sounds are normal.  She exhibits no distension and no mass. There is tenderness. There is no rebound and no guarding.  Tenderness to palpation in the LLQ  Musculoskeletal: Normal range of motion. She exhibits edema. She exhibits no tenderness.  Trace pitting  edema bilaterally  Neurological: She is alert and oriented to person, place, and time.  Skin: Skin is warm and dry. She is not diaphoretic.    ED Course  Procedures (including critical care time) Labs Review Labs Reviewed - No data to display Imaging Review No results found.  EKG Interpretation    Date/Time:    Ventricular Rate:    PR Interval:    QRS Duration:   QT Interval:    QTC Calculation:   R Axis:     Text Interpretation:             Results for orders placed during the hospital encounter of 12/21/12  CBC WITH DIFFERENTIAL      Result Value Range   WBC 17.2 (*) 4.0 - 10.5 K/uL   RBC 4.54  3.87 - 5.11 MIL/uL   Hemoglobin 14.7  12.0 - 15.0 g/dL   HCT 09.8  11.9 - 14.7 %   MCV 93.6  78.0 - 100.0 fL   MCH 32.4  26.0 - 34.0 pg   MCHC 34.6  30.0 - 36.0 g/dL   RDW 82.9  56.2 - 13.0 %   Platelets 254  150 - 400 K/uL   Neutrophils Relative % 88 (*) 43 - 77 %   Neutro Abs 15.0 (*) 1.7 - 7.7 K/uL   Lymphocytes Relative 6 (*) 12 - 46 %   Lymphs Abs 1.1  0.7 - 4.0 K/uL   Monocytes Relative 6  3 - 12 %   Monocytes Absolute 1.1 (*) 0.1 - 1.0 K/uL   Eosinophils Relative 0  0 - 5 %   Eosinophils Absolute 0.0  0.0 - 0.7 K/uL   Basophils Relative 0  0 - 1 %   Basophils Absolute 0.0  0.0 - 0.1 K/uL  COMPREHENSIVE METABOLIC PANEL      Result Value Range   Sodium 134 (*) 135 - 145 mEq/L   Potassium 4.3  3.5 - 5.1 mEq/L   Chloride 97  96 - 112 mEq/L   CO2 26  19 - 32 mEq/L   Glucose, Bld 122 (*) 70 - 99 mg/dL   BUN 18  6 - 23 mg/dL   Creatinine, Ser 8.65  0.50 - 1.10 mg/dL   Calcium 9.5  8.4 - 78.4 mg/dL   Total Protein 7.7  6.0 - 8.3 g/dL   Albumin 3.6  3.5 - 5.2 g/dL   AST 21  0 - 37 U/L   ALT 14  0 - 35 U/L   Alkaline  Phosphatase 84  39 - 117 U/L   Total Bilirubin 0.4  0.3 - 1.2 mg/dL   GFR calc non Af Amer 67 (*) >90 mL/min   GFR calc Af Amer 78 (*) >90 mL/min  LIPASE, BLOOD      Result Value Range   Lipase 31  11 - 59 U/L  URINALYSIS, ROUTINE W REFLEX MICROSCOPIC      Result Value Range   Color, Urine YELLOW  YELLOW   APPearance CLEAR  CLEAR   Specific Gravity, Urine 1.014  1.005 - 1.030   pH 7.0  5.0 - 8.0   Glucose, UA NEGATIVE  NEGATIVE mg/dL   Hgb urine dipstick NEGATIVE  NEGATIVE   Bilirubin Urine NEGATIVE  NEGATIVE   Ketones, ur NEGATIVE  NEGATIVE mg/dL   Protein, ur NEGATIVE  NEGATIVE mg/dL   Urobilinogen, UA 0.2  0.0 - 1.0 mg/dL   Nitrite NEGATIVE  NEGATIVE   Leukocytes, UA NEGATIVE  NEGATIVE  BASIC METABOLIC PANEL      Result Value Range   Sodium 138  135 - 145 mEq/L   Potassium 4.2  3.5 - 5.1 mEq/L   Chloride 107  96 - 112 mEq/L   CO2 23  19 - 32 mEq/L   Glucose, Bld 136 (*) 70 - 99 mg/dL   BUN 11  6 - 23 mg/dL   Creatinine, Ser 6.96  0.50 - 1.10 mg/dL   Calcium 8.5  8.4 - 29.5 mg/dL   GFR calc non Af Amer 58 (*) >90 mL/min   GFR calc Af Amer 68 (*) >90 mL/min  CBC      Result Value Range   WBC 10.2  4.0 - 10.5 K/uL   RBC 3.69 (*) 3.87 - 5.11 MIL/uL   Hemoglobin 12.2  12.0 - 15.0 g/dL   HCT 28.4 (*) 13.2 - 44.0 %   MCV 93.8  78.0 - 100.0 fL   MCH 33.1  26.0 - 34.0 pg   MCHC 35.3  30.0 - 36.0 g/dL   RDW 10.2  72.5 - 36.6 %   Platelets 200  150 - 400 K/uL    MDM   Belinda Day is a 67 y.o. female with a PMH of CAD, HTN, HLD, and GERD who presents to the ED for evaluation of abdominal and flank pain.   CBC, CMP, lipase, UA, and CT abdomen and pelvis ordered.      Patient presents with a 3 days hx of LLQ, left flank, and left lower back pain.  She was tender in the LLQ.  No peritoneal signs at this time.  She has an elevated WBC count of 17.2.  She is afebrile and non-toxic in appearance.  Her UA is negative for UTI.  Awaiting results of CT scan to determine cause of  abdominal pain.     3:45 PM = Signed out care to Dr. Gwendolyn Grant and Fayrene Helper PA-C.  Await results of CT scan.  Will continue to follow and re-check.     Luiz Iron PA-C    This patient was discussed with Dr. Serita Grit, PA-C 12/22/12 [phone]

## 2012-12-21 NOTE — Consult Note (Signed)
[facility] Medical Consultation  [name]:[id number] DOB: Oct 19, 1945 DOA: 12/21/2012 PCP: Lemont Fillers., NP   Requesting physician: Dr. Biagio Quint Date of consultation: 12/21/12 Reason for consultation: Chronic respiratory and cardiac problems in patient with acute diverticulitis  Impression/Recommendations Principal Problem:   Diverticulitis Active Problems:   CAD   Nocturnal hypoxemia    1. Diverticulitis - being managed by primary team, currently not surgically though may become surgical, overall patient likely low risk for surgery given high functioning baseline as discussed below. 2. CAD - S/P CABG 2002.  Since that time and currently patient remains on medical therapy with no coronary symptoms.  She is very active at baseline, able to walk up a flight of stairs, and recently ambulated at her pulmonologist's office earlier this month without any symptoms.  Her last Myoview stress test was in 2010 with no scar, no ischemia, and an EF of 77%.  Ordering an EKG to look for any changes pre-operatively, but overall if surgical intervention does end up being required for diverticulitis, the benefits will surely outweigh the small risks. 3. Nocturnal Hypoxemia - patient on O2 at night PRN 2.5L for chronic bronchiectasis, will order that while here, while ambulatory and awake patient not requiring O2 and sating 100% on room air.  I will followup again tomorrow. Please contact me if I can be of assistance in the meanwhile. Thank you for this consultation.  Chief Complaint: Abdominal pain  HPI:  Belinda Day is a 67 yo F who presents to the ED with LUQ abdominal pain.  Symptoms onset 3 days ago, progressively worsening.  8/10 severity at this point, feels like a "hot twisting knife".  Work up in the ED revealed significant diverticulitis, general surgery was asked to admit and medicine consulted.  Review of Systems:  12 systems reviewed and otherwise negative.  Past  Medical History  Diagnosis Date  . Bronchiectasis     oxygen at night   . MAI (mycobacterium avium-intracellulare)   . CAD (coronary artery disease)   . Hyperlipidemia   . HTN (hypertension)   . GERD (gastroesophageal reflux disease)   . History of shingles 04/2012  . Hypothyroidism   . Neuritis of upper extremity    Past Surgical History  Procedure Laterality Date  . Coronary artery bypass graft  2002   Social History:  reports that she quit smoking about 42 years ago. Her smoking use included Cigarettes. She has a 20 pack-year smoking history. She has never used smokeless tobacco. She reports that she does not drink alcohol or use illicit drugs.  Allergies  Allergen Reactions  . Ciprofloxacin     Body aches  . Codeine     REACTION: nausea  . Erythromycin     REACTION: nausea  . Levofloxacin     REACTION: aches   Family History  Problem Relation Age of Onset  . Cancer Mother     colon  . Hyperlipidemia Mother   . Hypertension Mother   . Heart disease Mother   . Stroke Mother   . Diabetes Mother   . Cancer Father     colon  . Arthritis Father   . Hyperlipidemia Father   . Hypertension Father   . Heart disease Father   . Stroke Father   . Atopy Neg Hx     Prior to Admission medications   Medication Sig Start Date End Date Taking? Authorizing Provider  acetaminophen (TYLENOL) 650 MG CR tablet every 8 (eight) hours as needed for  pain. 1 - 2 tablets daily for pain.   Yes Historical Provider, MD  amitriptyline (ELAVIL) 50 MG tablet Take 1 tablet (50 mg total) by mouth at bedtime. 12/18/12  Yes Sandford Craze, NP  aspirin 81 MG tablet Take 81 mg by mouth daily.    Yes Historical Provider, MD  atorvastatin (LIPITOR) 80 MG tablet Take 1 tablet (80 mg total) by mouth daily. 02/03/12  Yes Lewayne Bunting, MD  budesonide-formoterol (SYMBICORT) 160-4.5 MCG/ACT inhaler Inhale 2 puffs into the lungs 2 (two) times daily.   Yes Historical Provider, MD  diphenhydrAMINE  (BENADRYL) 25 MG tablet Take 40 mg by mouth at bedtime.    Yes Historical Provider, MD  levothyroxine (SYNTHROID, LEVOTHROID) 75 MCG tablet Take 75 mcg by mouth daily. 08/09/12  Yes Sandford Craze, NP  Multiple Vitamin (MULTIVITAMIN) tablet Take 1 tablet by mouth daily.    Yes Historical Provider, MD  nebivolol (BYSTOLIC) 5 MG tablet Take 5 mg by mouth daily.   Yes Historical Provider, MD  OXYGEN-HELIUM IN Inhale 2.5 L into the lungs at bedtime as needed.    Yes Historical Provider, MD  tiZANidine (ZANAFLEX) 4 MG tablet take 1 tablet by mouth at bedtime if needed 10/30/12  Yes Sandford Craze, NP  valsartan (DIOVAN) 160 MG tablet Take 160 mg by mouth daily.   Yes Historical Provider, MD  levalbuterol [facility] HFA) 45 MCG/ACT inhaler Inhale 1-2 puffs into the lungs 2 (two) times daily as needed for wheezing or shortness of breath.  07/30/11   Nyoka Cowden, MD   Physical Exam: Blood pressure 130/69, pulse 77, temperature 97.5 F (36.4 C), temperature source Oral, resp. rate 18, weight 60.102 kg (132 lb 8 oz), SpO2 96.00%. Filed Vitals:   12/21/12 1950  BP: 130/69  Pulse: 77  Temp: 97.5 F (36.4 C)  Resp: 18     General:  NAD, resting comfortably in bed  Eyes: PEERLA EOMI  ENT: mucous membranes moist  Neck: supple w/o JVD  Cardiovascular: RRR w/o MRG  Respiratory: CTA B  Abdomen: soft, tender especially on the L side, nd, bs+  Skin: no rash nor lesion  Musculoskeletal: MAE, full ROM all 4 extremities  Psychiatric: normal tone and affect  Neurologic: AAOx3, grossly non-focal   Labs on Admission:  Basic Metabolic Panel:  Recent Labs Lab 12/21/12 1455  NA 134*  K 4.3  CL 97  CO2 26  GLUCOSE 122*  BUN 18  CREATININE 0.86  CALCIUM 9.5   Liver Function Tests:  Recent Labs Lab 12/21/12 1455  AST 21  ALT 14  ALKPHOS 84  BILITOT 0.4  PROT 7.7  ALBUMIN 3.6    Recent Labs Lab 12/21/12 1455  LIPASE 31   No results found for this basename: AMMONIA,   in the last 168 hours CBC:  Recent Labs Lab 12/21/12 1455  WBC 17.2*  NEUTROABS 15.0*  HGB 14.7  HCT 42.5  MCV 93.6  PLT 254   Cardiac Enzymes: No results found for this basename: CKTOTAL, CKMB, CKMBINDEX, TROPONINI,  in the last 168 hours BNP: No components found with this basename: POCBNP,  CBG: No results found for this basename: GLUCAP,  in the last 168 hours  Radiological Exams on Admission: Ct Abdomen Pelvis W Contrast  12/21/2012   CLINICAL DATA:  Left upper quadrant pain  EXAM: CT ABDOMEN AND PELVIS WITH CONTRAST  TECHNIQUE: Multidetector CT imaging of the abdomen and pelvis was performed using the standard protocol following bolus administration of intravenous contrast.  CONTRAST:  OMNIPAQUE IOHEXOL 300 MG/ML  SOLN  COMPARISON:  None.  FINDINGS: Lung bases shows scarring and mild bronchiectasis in right lower lobe posterior medially.  Sagittal images of the spine are unremarkable. No calcified gallstones are noted within gallbladder. Enhanced liver, spleen, pancreas and adrenal glands are unremarkable. Enhanced kidneys are symmetrical in size. No hydronephrosis or hydroureter. Delayed renal images shows bilateral renal symmetrical excretion. There is a small cyst in lower pole of the right kidney measures 8 mm. Atherosclerotic calcifications are noted abdominal aorta and iliac arteries.  Abundant stool noted in right colon. No pericecal inflammation. Normal appendix clearly visualized in axial image 52. Scattered diverticula are noted in left colon. There is significant stranding of pericolonic fat in left upper abdomen just anterior to the spleen, splenic flexure of the colon. There is significant thickening of diverticular wall. Findings are consistent with significant acute diverticulitis. This is best visualized in axial image 12 and sagittal image 83. There are small pockets of probable extraluminal air suspicious for a contained perforation. Mixed collection in axial image  13 containing fecal like material and some air suspicious for early diverticular abscess. There is focal thickening of colonic wall in axial image 18  Follow-up colonoscopy after appropriate treatment is recommended.  The uterus and adnexa are unremarkable. The urinary bladder is unremarkable. No pelvic ascites or adenopathy. No small bowel obstruction.  IMPRESSION: 1. 1. Scattered diverticula are noted in left colon. There is significant stranding of pericolonic fat in left upper abdomen just anterior to the spleen, splenic flexure of the colon. There is significant thickening of diverticular wall. Findings are consistent with significant acute diverticulitis. This is best visualized in axial image 12 and sagittal image 83. There are small pockets of probable extraluminal air suspicious for a contained perforation. Mixed collection in axial image 13 containing fecal like material and some air suspicious for early diverticular abscess. There is focal thickening of colonic wall in axial image 18  Follow-up colonoscopy after appropriate treatment is recommended.  2. No pericecal inflammation. Normal appendix. No hydronephrosis or hydroureter.   Electronically Signed   By: Natasha Mead M.D.   On: 12/21/2012 16:43    EKG: Independently reviewed.  Time spent: 80 min  GARDNER, JARED M. [facility] Pager [phone]  If 7PM-7AM, please contact night-coverage [email or web address] Password [facility] 12/21/2012, 9:06 PM

## 2012-12-21 NOTE — ED Provider Notes (Signed)
Received sign out at beginning of shift.  Pt with LLQ abd pain, suspect diverticulitis.  Currently awaits CT   5:15 PM On exam pt has point tenderness to L side of abdomen without guarding or rebound tenderness. Does not appear toxic.  CT shows moderate acute diverticulitis with evidence concerning for micro perforation and early developing abscess.  Pt has elevated WBC og 17.2.  I have consulted General Surgery Dr. Biagio Quint, who will see pt in ER and will admit for further care.  Pain medication given.  Care discussed with attending.    6:01 PM Pt request for medication for spasm, will give valium.  i offer morphine, pt declined.  IVF given.    7:06 PM General Surgery Dr. Biagio Quint has seen and evaluate pt . He will admit pt but request hospitalist to consult on pt for further management of her chronic medical condition.  Will consult hospitalist.    7:15 PM I have consulted [facility], Dr. Julian Reil, who agrees to see pt for consultation.    BP 130/62  Pulse 71  Temp(Src) 98.8 F (37.1 C) (Oral)  Resp 17  SpO2 95%  I have reviewed nursing notes and vital signs. I personally reviewed the imaging tests through PACS system  I reviewed available ER/hospitalization records thought the EMR  Results for orders placed during the hospital encounter of 12/21/12  CBC WITH DIFFERENTIAL      Result Value Range   WBC 17.2 (*) 4.0 - 10.5 K/uL   RBC 4.54  3.87 - 5.11 MIL/uL   Hemoglobin 14.7  12.0 - 15.0 g/dL   HCT 16.1  09.6 - 04.5 %   MCV 93.6  78.0 - 100.0 fL   MCH 32.4  26.0 - 34.0 pg   MCHC 34.6  30.0 - 36.0 g/dL   RDW 40.9  81.1 - 91.4 %   Platelets 254  150 - 400 K/uL   Neutrophils Relative % 88 (*) 43 - 77 %   Neutro Abs 15.0 (*) 1.7 - 7.7 K/uL   Lymphocytes Relative 6 (*) 12 - 46 %   Lymphs Abs 1.1  0.7 - 4.0 K/uL   Monocytes Relative 6  3 - 12 %   Monocytes Absolute 1.1 (*) 0.1 - 1.0 K/uL   Eosinophils Relative 0  0 - 5 %   Eosinophils Absolute 0.0  0.0 - 0.7 K/uL   Basophils  Relative 0  0 - 1 %   Basophils Absolute 0.0  0.0 - 0.1 K/uL  COMPREHENSIVE METABOLIC PANEL      Result Value Range   Sodium 134 (*) 135 - 145 mEq/L   Potassium 4.3  3.5 - 5.1 mEq/L   Chloride 97  96 - 112 mEq/L   CO2 26  19 - 32 mEq/L   Glucose, Bld 122 (*) 70 - 99 mg/dL   BUN 18  6 - 23 mg/dL   Creatinine, Ser 7.82  0.50 - 1.10 mg/dL   Calcium 9.5  8.4 - 95.6 mg/dL   Total Protein 7.7  6.0 - 8.3 g/dL   Albumin 3.6  3.5 - 5.2 g/dL   AST 21  0 - 37 U/L   ALT 14  0 - 35 U/L   Alkaline Phosphatase 84  39 - 117 U/L   Total Bilirubin 0.4  0.3 - 1.2 mg/dL   GFR calc non Af Amer 67 (*) >90 mL/min   GFR calc Af Amer 78 (*) >90 mL/min  LIPASE, BLOOD  Result Value Range   Lipase 31  11 - 59 U/L  URINALYSIS, ROUTINE W REFLEX MICROSCOPIC      Result Value Range   Color, Urine YELLOW  YELLOW   APPearance CLEAR  CLEAR   Specific Gravity, Urine 1.014  1.005 - 1.030   pH 7.0  5.0 - 8.0   Glucose, UA NEGATIVE  NEGATIVE mg/dL   Hgb urine dipstick NEGATIVE  NEGATIVE   Bilirubin Urine NEGATIVE  NEGATIVE   Ketones, ur NEGATIVE  NEGATIVE mg/dL   Protein, ur NEGATIVE  NEGATIVE mg/dL   Urobilinogen, UA 0.2  0.0 - 1.0 mg/dL   Nitrite NEGATIVE  NEGATIVE   Leukocytes, UA NEGATIVE  NEGATIVE   Dg Chest 2 View  12/12/2012   CLINICAL DATA:  Bronchiectasis  EXAM: CHEST  2 VIEW  COMPARISON:  October 29, 2011  FINDINGS: There is postoperative change on the right, stable. Bronchiectatic change in the right middle lobe is stable. Scarring bilaterally in the lower lobes and right middle lobe remain stable. There is no edema or consolidation. There is no new opacity.  Heart size is within normal limits. Patient is status post coronary artery bypass grafting. The pulmonary vascularity is stable. There is diminished vascularity in the upper lobes, probably due to a degree of underlying emphysematous change. No adenopathy. No bone lesions.  IMPRESSION: Areas of scarring. Stable appearing bronchiectatic change  right middle lobe. Underlying emphysematous change. No new opacity. No edema or consolidation.   Electronically Signed   By: Bretta Bang M.D.   On: 12/12/2012 09:22   Ct Abdomen Pelvis W Contrast  12/21/2012   CLINICAL DATA:  Left upper quadrant pain  EXAM: CT ABDOMEN AND PELVIS WITH CONTRAST  TECHNIQUE: Multidetector CT imaging of the abdomen and pelvis was performed using the standard protocol following bolus administration of intravenous contrast.  CONTRAST:  OMNIPAQUE IOHEXOL 300 MG/ML  SOLN  COMPARISON:  None.  FINDINGS: Lung bases shows scarring and mild bronchiectasis in right lower lobe posterior medially.  Sagittal images of the spine are unremarkable. No calcified gallstones are noted within gallbladder. Enhanced liver, spleen, pancreas and adrenal glands are unremarkable. Enhanced kidneys are symmetrical in size. No hydronephrosis or hydroureter. Delayed renal images shows bilateral renal symmetrical excretion. There is a small cyst in lower pole of the right kidney measures 8 mm. Atherosclerotic calcifications are noted abdominal aorta and iliac arteries.  Abundant stool noted in right colon. No pericecal inflammation. Normal appendix clearly visualized in axial image 52. Scattered diverticula are noted in left colon. There is significant stranding of pericolonic fat in left upper abdomen just anterior to the spleen, splenic flexure of the colon. There is significant thickening of diverticular wall. Findings are consistent with significant acute diverticulitis. This is best visualized in axial image 12 and sagittal image 83. There are small pockets of probable extraluminal air suspicious for a contained perforation. Mixed collection in axial image 13 containing fecal like material and some air suspicious for early diverticular abscess. There is focal thickening of colonic wall in axial image 18  Follow-up colonoscopy after appropriate treatment is recommended.  The uterus and adnexa are  unremarkable. The urinary bladder is unremarkable. No pelvic ascites or adenopathy. No small bowel obstruction.  IMPRESSION: 1. 1. Scattered diverticula are noted in left colon. There is significant stranding of pericolonic fat in left upper abdomen just anterior to the spleen, splenic flexure of the colon. There is significant thickening of diverticular wall. Findings are consistent with significant acute  diverticulitis. This is best visualized in axial image 12 and sagittal image 83. There are small pockets of probable extraluminal air suspicious for a contained perforation. Mixed collection in axial image 13 containing fecal like material and some air suspicious for early diverticular abscess. There is focal thickening of colonic wall in axial image 18  Follow-up colonoscopy after appropriate treatment is recommended.  2. No pericecal inflammation. Normal appendix. No hydronephrosis or hydroureter.   Electronically Signed   By: Natasha Mead M.D.   On: 12/21/2012 16:43    Medications  morphine 4 MG/ML injection 4 mg (4 mg Intravenous Not Given 12/21/12 1706)  iohexol (OMNIPAQUE) 300 MG/ML solution 50 mL (50 mLs Oral Contrast Given 12/21/12 1517)  iohexol (OMNIPAQUE) 300 MG/ML solution 100 mL (100 mLs Intravenous Contrast Given 12/21/12 1617)     Fayrene Helper, PA-C 12/21/12 1915

## 2012-12-21 NOTE — ED Notes (Addendum)
Pt would rather have ant-ispasmatic for her abd instead of Morphine Ailene Ravel MD aware

## 2012-12-22 DIAGNOSIS — E039 Hypothyroidism, unspecified: Secondary | ICD-10-CM

## 2012-12-22 DIAGNOSIS — J479 Bronchiectasis, uncomplicated: Secondary | ICD-10-CM

## 2012-12-22 LAB — BASIC METABOLIC PANEL
BUN: 11 mg/dL (ref 6–23)
CO2: 23 mEq/L (ref 19–32)
Calcium: 8.5 mg/dL (ref 8.4–10.5)
Creatinine, Ser: 0.97 mg/dL (ref 0.50–1.10)
GFR calc non Af Amer: 58 mL/min — ABNORMAL LOW (ref >90)
Glucose, Bld: 136 mg/dL — ABNORMAL HIGH (ref 70–99)

## 2012-12-22 LAB — CBC
MCH: 33.1 pg (ref 26.0–34.0)
WBC: 10.2 10*3/uL (ref 4.0–10.5)

## 2012-12-22 MED ORDER — ONDANSETRON HCL 4 MG/2ML IJ SOLN
4.0000 mg | INTRAMUSCULAR | Status: DC | PRN
  Administered 2012-12-22 – 2012-12-26 (×5): 4 mg via INTRAVENOUS
  Filled 2012-12-22 (×5): qty 2

## 2012-12-22 MED ORDER — LEVALBUTEROL TARTRATE 45 MCG/ACT IN AERO: 1.0000 | INHALATION_SPRAY | Freq: Four times a day (QID) | RESPIRATORY_TRACT | Status: DC | PRN

## 2012-12-22 MED ORDER — ACETAMINOPHEN 325 MG PO TABS
650.0000 mg | ORAL_TABLET | Freq: Four times a day (QID) | ORAL | Status: DC | PRN
  Administered 2012-12-22 – 2012-12-29 (×3): 650 mg via ORAL
  Filled 2012-12-22 (×3): qty 2

## 2012-12-22 NOTE — Progress Notes (Signed)
Subjective: States that pain is improved with morphine and about the same without. No fevers.   Objective: Vital signs in last 24 hours: Temp:  [97.5 F (36.4 C)-98.8 F (37.1 C)] 98.2 F (36.8 C) (11/28 0545) Pulse Rate:  [67-103] 67 (11/28 0545) Resp:  [17-20] 18 (11/28 0545) BP: (97-133)/(52-71) 122/71 mmHg (11/28 0545) SpO2:  [95 %-100 %] 97 % (11/28 0806) Weight:  [132 lb 8 oz (60.102 kg)] 132 lb 8 oz (60.102 kg) (11/27 2300) Last BM Date: 12/19/12  Intake/Output from previous day: 11/27 0701 - 11/28 0700 In: 1958 [I.V.:1358; IV Piggyback:600] Out: 1300 [Urine:1300] Intake/Output this shift:    General appearance: alert, cooperative and no distress Resp: nonlabored Cardio: normal rate, regular GI: soft, focal RUQ tenderness, ND, no peritoneal signs  Lab Results:   Recent Labs  12/21/12 1455 12/22/12 0430  WBC 17.2* 10.2  HGB 14.7 12.2  HCT 42.5 34.6*  PLT 254 200   BMET  Recent Labs  12/21/12 1455 12/22/12 0430  NA 134* 138  K 4.3 4.2  CL 97 107  CO2 26 23  GLUCOSE 122* 136*  BUN 18 11  CREATININE 0.86 0.97  CALCIUM 9.5 8.5   PT/INR No results found for this basename: LABPROT, INR,  in the last 72 hours ABG No results found for this basename: PHART, PCO2, PO2, HCO3,  in the last 72 hours  Studies/Results: Ct Abdomen Pelvis W Contrast  12/21/2012   CLINICAL DATA:  Left upper quadrant pain  EXAM: CT ABDOMEN AND PELVIS WITH CONTRAST  TECHNIQUE: Multidetector CT imaging of the abdomen and pelvis was performed using the standard protocol following bolus administration of intravenous contrast.  CONTRAST:  OMNIPAQUE IOHEXOL 300 MG/ML  SOLN  COMPARISON:  None.  FINDINGS: Lung bases shows scarring and mild bronchiectasis in right lower lobe posterior medially.  Sagittal images of the spine are unremarkable. No calcified gallstones are noted within gallbladder. Enhanced liver, spleen, pancreas and adrenal glands are unremarkable. Enhanced kidneys are  symmetrical in size. No hydronephrosis or hydroureter. Delayed renal images shows bilateral renal symmetrical excretion. There is a small cyst in lower pole of the right kidney measures 8 mm. Atherosclerotic calcifications are noted abdominal aorta and iliac arteries.  Abundant stool noted in right colon. No pericecal inflammation. Normal appendix clearly visualized in axial image 52. Scattered diverticula are noted in left colon. There is significant stranding of pericolonic fat in left upper abdomen just anterior to the spleen, splenic flexure of the colon. There is significant thickening of diverticular wall. Findings are consistent with significant acute diverticulitis. This is best visualized in axial image 12 and sagittal image 83. There are small pockets of probable extraluminal air suspicious for a contained perforation. Mixed collection in axial image 13 containing fecal like material and some air suspicious for early diverticular abscess. There is focal thickening of colonic wall in axial image 18  Follow-up colonoscopy after appropriate treatment is recommended.  The uterus and adnexa are unremarkable. The urinary bladder is unremarkable. No pelvic ascites or adenopathy. No small bowel obstruction.  IMPRESSION: 1. 1. Scattered diverticula are noted in left colon. There is significant stranding of pericolonic fat in left upper abdomen just anterior to the spleen, splenic flexure of the colon. There is significant thickening of diverticular wall. Findings are consistent with significant acute diverticulitis. This is best visualized in axial image 12 and sagittal image 83. There are small pockets of probable extraluminal air suspicious for a contained perforation. Mixed collection in axial  image 13 containing fecal like material and some air suspicious for early diverticular abscess. There is focal thickening of colonic wall in axial image 18  Follow-up colonoscopy after appropriate treatment is recommended.   2. No pericecal inflammation. Normal appendix. No hydronephrosis or hydroureter.   Electronically Signed   By: Natasha Mead M.D.   On: 12/21/2012 16:43    Anti-infectives: Anti-infectives   Start     Dose/Rate Route Frequency Ordered Stop   12/21/12 2200  piperacillin-tazobactam (ZOSYN) IVPB 3.375 g     3.375 g 12.5 mL/hr over 240 Minutes Intravenous 3 times per day 12/21/12 1956        Assessment/Plan: s/p * No surgery found * continue abx and we will see how she does clinically.  if no improvement, she may need surgical treatment of repeat scan to evaluate for abscess.  LOS: 1 day    Lodema Pilot DAVID 12/22/2012

## 2012-12-22 NOTE — Care Management Note (Signed)
    Page 1 of 1   12/22/2012     10:09:38 AM   CARE MANAGEMENT NOTE 12/22/2012  Patient:  Belinda Day, Belinda Day   Account Number:  [id number]  Date Initiated:  12/22/2012  Documentation initiated by:  Lorenda Ishihara  Subjective/Objective Assessment:   67 yo female admitted with diverticulitis, possible microperf and abscess. PTA lived at home alone.     Action/Plan:   Home when stable   Anticipated DC Date:  12/25/2012   Anticipated DC Plan:  HOME/SELF CARE      DC Planning Services  CM consult      Choice offered to / List presented to:             Status of service:  Completed, signed off Medicare Important Message given?   (If response is "NO", the following Medicare IM given date fields will be blank) Date Medicare IM given:   Date Additional Medicare IM given:    Discharge Disposition:  HOME/SELF CARE  Per UR Regulation:  Reviewed for med. necessity/level of care/duration of stay  If discussed at Long Length of Stay Meetings, dates discussed:    Comments:

## 2012-12-22 NOTE — Progress Notes (Signed)
Lenny Pastel PA,  Notified patient has headache.  Asking for Tylenol.  Orders received.

## 2012-12-22 NOTE — Progress Notes (Signed)
[facility] PROGRESS NOTE  Belinda Day:[id number] DOB: Sep 26, 1945 DOA: 12/21/2012 PCP: Lemont Fillers., NP  Assessment/Plan: Acute diverticulitis with microperforation -Management per surgery -Continue antibiotics -WBCs are improving -medically stable to go to surgery if patient needs intervention Coronary artery disease -History of CABG 2002 -Stable without any symptoms -restart ASA if no plans for surgery -Continue nebivolol -EKG unchanged compared to 12/2011, nonspecific changes Hyperlipidemia -Continue Lipitor Bronchiectasis with nocturnal hypoxemia -No hypoxemia during daytime -Continue when necessary nighttime oxygen -xopenex prn sob -Clinically stable -Continue Symbicort Hypertension -Continue ARB and nebivolol -controlled   Family Communication:   Pt at beside Disposition Plan:   Home when medically stable   Antibiotics:  Zosyn 12/21/12>>>    Procedures/Studies: Dg Chest 2 View  12/12/2012   CLINICAL DATA:  Bronchiectasis  EXAM: CHEST  2 VIEW  COMPARISON:  October 29, 2011  FINDINGS: There is postoperative change on the right, stable. Bronchiectatic change in the right middle lobe is stable. Scarring bilaterally in the lower lobes and right middle lobe remain stable. There is no edema or consolidation. There is no new opacity.  Heart size is within normal limits. Patient is status post coronary artery bypass grafting. The pulmonary vascularity is stable. There is diminished vascularity in the upper lobes, probably due to a degree of underlying emphysematous change. No adenopathy. No bone lesions.  IMPRESSION: Areas of scarring. Stable appearing bronchiectatic change right middle lobe. Underlying emphysematous change. No new opacity. No edema or consolidation.   Electronically Signed   By: Bretta Bang M.D.   On: 12/12/2012 09:22   Ct Abdomen Pelvis W Contrast  12/21/2012   CLINICAL DATA:  Left upper quadrant pain  EXAM: CT ABDOMEN AND  PELVIS WITH CONTRAST  TECHNIQUE: Multidetector CT imaging of the abdomen and pelvis was performed using the standard protocol following bolus administration of intravenous contrast.  CONTRAST:  OMNIPAQUE IOHEXOL 300 MG/ML  SOLN  COMPARISON:  None.  FINDINGS: Lung bases shows scarring and mild bronchiectasis in right lower lobe posterior medially.  Sagittal images of the spine are unremarkable. No calcified gallstones are noted within gallbladder. Enhanced liver, spleen, pancreas and adrenal glands are unremarkable. Enhanced kidneys are symmetrical in size. No hydronephrosis or hydroureter. Delayed renal images shows bilateral renal symmetrical excretion. There is a small cyst in lower pole of the right kidney measures 8 mm. Atherosclerotic calcifications are noted abdominal aorta and iliac arteries.  Abundant stool noted in right colon. No pericecal inflammation. Normal appendix clearly visualized in axial image 52. Scattered diverticula are noted in left colon. There is significant stranding of pericolonic fat in left upper abdomen just anterior to the spleen, splenic flexure of the colon. There is significant thickening of diverticular wall. Findings are consistent with significant acute diverticulitis. This is best visualized in axial image 12 and sagittal image 83. There are small pockets of probable extraluminal air suspicious for a contained perforation. Mixed collection in axial image 13 containing fecal like material and some air suspicious for early diverticular abscess. There is focal thickening of colonic wall in axial image 18  Follow-up colonoscopy after appropriate treatment is recommended.  The uterus and adnexa are unremarkable. The urinary bladder is unremarkable. No pelvic ascites or adenopathy. No small bowel obstruction.  IMPRESSION: 1. 1. Scattered diverticula are noted in left colon. There is significant stranding of pericolonic fat in left upper abdomen just anterior to the spleen,  splenic flexure of the colon. There is significant thickening of diverticular wall. Findings are consistent  with significant acute diverticulitis. This is best visualized in axial image 12 and sagittal image 83. There are small pockets of probable extraluminal air suspicious for a contained perforation. Mixed collection in axial image 13 containing fecal like material and some air suspicious for early diverticular abscess. There is focal thickening of colonic wall in axial image 18  Follow-up colonoscopy after appropriate treatment is recommended.  2. No pericecal inflammation. Normal appendix. No hydronephrosis or hydroureter.   Electronically Signed   By: Natasha Mead M.D.   On: 12/21/2012 16:43         Subjective: Patient states the pain is controlled with morphine. Has intermittent nausea. Denies any fevers, chills, chest discomfort, shortness of breath, orthopnea, vomiting, diarrhea patient has some nausea. Abdominal pain is intermittent. No dysuria or hematuria. No hematochezia.  Objective: Filed Vitals:   12/22/12 0142 12/22/12 0545 12/22/12 0806 12/22/12 1041  BP: 97/57 122/71  100/64  Pulse: 71 67  57  Temp: 98.1 F (36.7 C) 98.2 F (36.8 C)  98 F (36.7 C)  TempSrc: Oral Oral  Oral  Resp: 18 18  18   Height:      Weight:      SpO2: 98% 100% 97% 98%    Intake/Output Summary (Last 24 hours) at 12/22/12 1110 Last data filed at 12/22/12 0912  Gross per 24 hour  Intake   1958 ml  Output   1800 ml  Net    158 ml   Weight change:  Exam:   General:  Pt is alert, follows commands appropriately, not in acute distress  HEENT: No icterus, No thrush,  [place]/AT  Cardiovascular: RRR, S1/S2, no rubs, no gallops  Respiratory: Scattered bibasilar rales. No wheezing. Good air movement.  Abdomen: Soft/+BS, LUQ+L-flank tender without peritoneal signs, non distended, no guarding  Extremities: No edema, No lymphangitis, No petechiae, No rashes, no synovitis  Data Reviewed: Basic  Metabolic Panel:  Recent Labs Lab 12/21/12 1455 12/22/12 0430  NA 134* 138  K 4.3 4.2  CL 97 107  CO2 26 23  GLUCOSE 122* 136*  BUN 18 11  CREATININE 0.86 0.97  CALCIUM 9.5 8.5   Liver Function Tests:  Recent Labs Lab 12/21/12 1455  AST 21  ALT 14  ALKPHOS 84  BILITOT 0.4  PROT 7.7  ALBUMIN 3.6    Recent Labs Lab 12/21/12 1455  LIPASE 31   No results found for this basename: AMMONIA,  in the last 168 hours CBC:  Recent Labs Lab 12/21/12 1455 12/22/12 0430  WBC 17.2* 10.2  NEUTROABS 15.0*  --   HGB 14.7 12.2  HCT 42.5 34.6*  MCV 93.6 93.8  PLT 254 200   Cardiac Enzymes: No results found for this basename: CKTOTAL, CKMB, CKMBINDEX, TROPONINI,  in the last 168 hours BNP: No components found with this basename: POCBNP,  CBG: No results found for this basename: GLUCAP,  in the last 168 hours  No results found for this or any previous visit (from the past 240 hour(s)).   Scheduled Meds: . amitriptyline  50 mg Oral QHS  . atorvastatin  80 mg Oral Daily  . budesonide-formoterol  2 puff Inhalation BID  . heparin  5,000 Units Subcutaneous Q8H  . irbesartan  150 mg Oral Daily  . levothyroxine  75 mcg Oral QAC breakfast  . nebivolol  5 mg Oral Daily  . pantoprazole (PROTONIX) IV  40 mg Intravenous QHS  . piperacillin-tazobactam (ZOSYN)  IV  3.375 g Intravenous Q8H   Continuous  Infusions: . dextrose 5 % and 0.45 % NaCl with KCl 20 mEq/L 110 mL/hr at 12/22/12 1024     Belinda Oloughlin, DO  [facility] Pager [phone]  If 7PM-7AM, please contact night-coverage [email or web address] Password [email or web address] 12/22/2012, 11:10 AM   LOS: 1 day

## 2012-12-22 NOTE — ED Provider Notes (Signed)
Medical screening examination/treatment/procedure(s) were conducted as a shared visit with non-physician practitioner(s) and myself.  I personally evaluated the patient during the encounter.  EKG Interpretation    Date/Time:    Ventricular Rate:    PR Interval:    QRS Duration:   QT Interval:    QTC Calculation:   R Axis:     Text Interpretation:               Patient here with 3 days L flank pain, LUQ pain. Worsening. Constant. No fevers, no vomiting. No diarrhea. LUQ guarding, no rebound. Patient has leukocytosis, CT scan ordered. CT shows diverticulitis with possible early abscess and possible microperforation. Surgery consulted and admitting. Antibiotics given.   Dagmar Hait, MD 12/22/12 [phone]

## 2012-12-23 DIAGNOSIS — K5792 Diverticulitis of intestine, part unspecified, without perforation or abscess without bleeding: Secondary | ICD-10-CM | POA: Diagnosis present

## 2012-12-23 LAB — CBC
HCT: 39.9 % (ref 36.0–46.0)
Hemoglobin: 13.3 g/dL (ref 12.0–15.0)
MCH: 31.4 pg (ref 26.0–34.0)
MCHC: 33.3 g/dL (ref 30.0–36.0)
Platelets: 228 10*3/uL (ref 150–400)
RBC: 4.24 MIL/uL (ref 3.87–5.11)
RDW: 12.5 % (ref 11.5–15.5)
WBC: 8.1 10*3/uL (ref 4.0–10.5)

## 2012-12-23 LAB — BASIC METABOLIC PANEL
BUN: 9 mg/dL (ref 6–23)
Calcium: 8.9 mg/dL (ref 8.4–10.5)
Chloride: 103 mEq/L (ref 96–112)
GFR calc non Af Amer: 83 mL/min — ABNORMAL LOW (ref >90)
Glucose, Bld: 112 mg/dL — ABNORMAL HIGH (ref 70–99)
Potassium: 4 mEq/L (ref 3.5–5.1)
Sodium: 137 mEq/L (ref 135–145)

## 2012-12-23 MED ORDER — KETOROLAC TROMETHAMINE 15 MG/ML IJ SOLN
15.0000 mg | Freq: Four times a day (QID) | INTRAMUSCULAR | Status: AC | PRN
  Administered 2012-12-23: 15 mg via INTRAVENOUS
  Filled 2012-12-23: qty 1

## 2012-12-23 NOTE — Progress Notes (Signed)
Subjective: Abdominal pain improving.  C/o HA and nausea  Objective: Vital signs in last 24 hours: Temp:  [97.4 F (36.3 C)-99.3 F (37.4 C)] 98.3 F (36.8 C) (11/29 0516) Pulse Rate:  [57-79] 75 (11/29 0516) Resp:  [16-18] 16 (11/29 0516) BP: (100-135)/(47-74) 132/61 mmHg (11/29 0516) SpO2:  [94 %-100 %] 97 % (11/29 0516) Last BM Date: 12/19/12  Intake/Output from previous day: 11/28 0701 - 11/29 0700 In: 2859.7 [I.V.:2709.7; IV Piggyback:150] Out: 2250 [Urine:2250] Intake/Output this shift:    General appearance: alert, cooperative and no distress Resp: nonlabored Cardio: normal rate, regular GI: soft, mild LUQ tenderness (improved from prior exams), ND, no peritoneal signs Extremities: scd's bilat  Lab Results:   Recent Labs  12/22/12 0430 12/23/12 0510  WBC 10.2 8.1  HGB 12.2 13.3  HCT 34.6* 39.9  PLT 200 228   BMET  Recent Labs  12/22/12 0430 12/23/12 0510  NA 138 137  K 4.2 4.0  CL 107 103  CO2 23 23  GLUCOSE 136* 112*  BUN 11 9  CREATININE 0.97 0.78  CALCIUM 8.5 8.9   PT/INR No results found for this basename: LABPROT, INR,  in the last 72 hours ABG No results found for this basename: PHART, PCO2, PO2, HCO3,  in the last 72 hours  Studies/Results: Ct Abdomen Pelvis W Contrast  12/21/2012   CLINICAL DATA:  Left upper quadrant pain  EXAM: CT ABDOMEN AND PELVIS WITH CONTRAST  TECHNIQUE: Multidetector CT imaging of the abdomen and pelvis was performed using the standard protocol following bolus administration of intravenous contrast.  CONTRAST:  OMNIPAQUE IOHEXOL 300 MG/ML  SOLN  COMPARISON:  None.  FINDINGS: Lung bases shows scarring and mild bronchiectasis in right lower lobe posterior medially.  Sagittal images of the spine are unremarkable. No calcified gallstones are noted within gallbladder. Enhanced liver, spleen, pancreas and adrenal glands are unremarkable. Enhanced kidneys are symmetrical in size. No hydronephrosis or hydroureter.  Delayed renal images shows bilateral renal symmetrical excretion. There is a small cyst in lower pole of the right kidney measures 8 mm. Atherosclerotic calcifications are noted abdominal aorta and iliac arteries.  Abundant stool noted in right colon. No pericecal inflammation. Normal appendix clearly visualized in axial image 52. Scattered diverticula are noted in left colon. There is significant stranding of pericolonic fat in left upper abdomen just anterior to the spleen, splenic flexure of the colon. There is significant thickening of diverticular wall. Findings are consistent with significant acute diverticulitis. This is best visualized in axial image 12 and sagittal image 83. There are small pockets of probable extraluminal air suspicious for a contained perforation. Mixed collection in axial image 13 containing fecal like material and some air suspicious for early diverticular abscess. There is focal thickening of colonic wall in axial image 18  Follow-up colonoscopy after appropriate treatment is recommended.  The uterus and adnexa are unremarkable. The urinary bladder is unremarkable. No pelvic ascites or adenopathy. No small bowel obstruction.  IMPRESSION: 1. 1. Scattered diverticula are noted in left colon. There is significant stranding of pericolonic fat in left upper abdomen just anterior to the spleen, splenic flexure of the colon. There is significant thickening of diverticular wall. Findings are consistent with significant acute diverticulitis. This is best visualized in axial image 12 and sagittal image 83. There are small pockets of probable extraluminal air suspicious for a contained perforation. Mixed collection in axial image 13 containing fecal like material and some air suspicious for early diverticular abscess. There is  focal thickening of colonic wall in axial image 18  Follow-up colonoscopy after appropriate treatment is recommended.  2. No pericecal inflammation. Normal appendix. No  hydronephrosis or hydroureter.   Electronically Signed   By: Natasha Mead M.D.   On: 12/21/2012 16:43    Anti-infectives: Anti-infectives   Start     Dose/Rate Route Frequency Ordered Stop   12/21/12 2200  piperacillin-tazobactam (ZOSYN) IVPB 3.375 g     3.375 g 12.5 mL/hr over 240 Minutes Intravenous 3 times per day 12/21/12 1956        Assessment/Plan: s/p * No surgery found * Her abdominal pain seems to be improving. HR normal and remains AF.  wbc continues to come down.  will continue with medical management for now as long as she continues to improve.  will plan for repeat CT on monday to evaluated for developing abscess or fluid collection.  LOS: 2 days    Lodema Pilot DAVID 12/23/2012

## 2012-12-23 NOTE — Progress Notes (Signed)
[facility] PROGRESS NOTE  Belinda Day UJW:[id number] DOB: [birth year] DOA: 12/21/2012 PCP: Lemont Fillers., NP  Assessment/Plan: Acute diverticulitis with microperforation  -Management per surgery  -Continue antibiotics  -WBCs are improving  -medically stable to go to surgery if patient needs intervention  Coronary artery disease  -History of CABG 2002  -Stable without any symptoms  -restart ASA if no plans for surgery  -Continue nebivolol  -EKG unchanged compared to 12/2011, nonspecific changes  Hyperlipidemia  -Hold Lipitor for now as pt is vomiting  Bronchiectasis with nocturnal hypoxemia  -No hypoxemia during daytime  -Continue when necessary nighttime oxygen  -xopenex prn sob  -Clinically stable  -Continue Symbicort  Hypertension  -Continue ARB and nebivolol  -controlled Family Communication: Pt at beside  Disposition Plan: Home when medically stable  Antibiotics:  Zosyn 12/21/12>>>         Procedures/Studies: Dg Chest 2 View  12/12/2012   CLINICAL DATA:  Bronchiectasis  EXAM: CHEST  2 VIEW  COMPARISON:  October 29, 2011  FINDINGS: There is postoperative change on the right, stable. Bronchiectatic change in the right middle lobe is stable. Scarring bilaterally in the lower lobes and right middle lobe remain stable. There is no edema or consolidation. There is no new opacity.  Heart size is within normal limits. Patient is status post coronary artery bypass grafting. The pulmonary vascularity is stable. There is diminished vascularity in the upper lobes, probably due to a degree of underlying emphysematous change. No adenopathy. No bone lesions.  IMPRESSION: Areas of scarring. Stable appearing bronchiectatic change right middle lobe. Underlying emphysematous change. No new opacity. No edema or consolidation.   Electronically Signed   By: Bretta Bang M.D.   On: 12/12/2012 09:22   Ct Abdomen Pelvis W Contrast  12/21/2012   CLINICAL DATA:  Left  upper quadrant pain  EXAM: CT ABDOMEN AND PELVIS WITH CONTRAST  TECHNIQUE: Multidetector CT imaging of the abdomen and pelvis was performed using the standard protocol following bolus administration of intravenous contrast.  CONTRAST:  OMNIPAQUE IOHEXOL 300 MG/ML  SOLN  COMPARISON:  None.  FINDINGS: Lung bases shows scarring and mild bronchiectasis in right lower lobe posterior medially.  Sagittal images of the spine are unremarkable. No calcified gallstones are noted within gallbladder. Enhanced liver, spleen, pancreas and adrenal glands are unremarkable. Enhanced kidneys are symmetrical in size. No hydronephrosis or hydroureter. Delayed renal images shows bilateral renal symmetrical excretion. There is a small cyst in lower pole of the right kidney measures 8 mm. Atherosclerotic calcifications are noted abdominal aorta and iliac arteries.  Abundant stool noted in right colon. No pericecal inflammation. Normal appendix clearly visualized in axial image 52. Scattered diverticula are noted in left colon. There is significant stranding of pericolonic fat in left upper abdomen just anterior to the spleen, splenic flexure of the colon. There is significant thickening of diverticular wall. Findings are consistent with significant acute diverticulitis. This is best visualized in axial image 12 and sagittal image 83. There are small pockets of probable extraluminal air suspicious for a contained perforation. Mixed collection in axial image 13 containing fecal like material and some air suspicious for early diverticular abscess. There is focal thickening of colonic wall in axial image 18  Follow-up colonoscopy after appropriate treatment is recommended.  The uterus and adnexa are unremarkable. The urinary bladder is unremarkable. No pelvic ascites or adenopathy. No small bowel obstruction.  IMPRESSION: 1. 1. Scattered diverticula are noted in left colon. There is significant stranding of pericolonic  fat in left upper  abdomen just anterior to the spleen, splenic flexure of the colon. There is significant thickening of diverticular wall. Findings are consistent with significant acute diverticulitis. This is best visualized in axial image 12 and sagittal image 83. There are small pockets of probable extraluminal air suspicious for a contained perforation. Mixed collection in axial image 13 containing fecal like material and some air suspicious for early diverticular abscess. There is focal thickening of colonic wall in axial image 18  Follow-up colonoscopy after appropriate treatment is recommended.  2. No pericecal inflammation. Normal appendix. No hydronephrosis or hydroureter.   Electronically Signed   By: Natasha Mead M.D.   On: 12/21/2012 16:43         Subjective: Patient had 3 episodes of emesis last night. Denies fevers, chills, chest discomfort, shortness of breath, dysuria, hematuria. No hematemesis. Has not had a bowel movement but is passing flatus. No hematochezia or melena. Abdominal pain is improving.  Objective: Filed Vitals:   12/22/12 2053 12/23/12 0133 12/23/12 0516 12/23/12 0935  BP: 114/53 135/47 132/61   Pulse: 67 73 75   Temp: 99.3 F (37.4 C) 97.4 F (36.3 C) 98.3 F (36.8 C)   TempSrc: Oral Oral Oral   Resp: 16 16 16    Height:      Weight:      SpO2: 98% 100% 97% 94%    Intake/Output Summary (Last 24 hours) at 12/23/12 1351 Last data filed at 12/23/12 0555  Gross per 24 hour  Intake 1929.67 ml  Output   1200 ml  Net 729.67 ml   Weight change:  Exam:   General:  Pt is alert, follows commands appropriately, not in acute distress  HEENT: No icterus, No thrush, [place]/AT  Cardiovascular: RRR, S1/S2, no rubs, no gallops  Respiratory: Bibasilar crackles, R>L, no wheezing. Good air movement.  Abdomen: Soft/+BS, LUQ,LLQ tender without peritoneal sign, non distended, no guarding  Extremities: No edema, No lymphangitis, No petechiae, No rashes, no synovitis  Data  Reviewed: Basic Metabolic Panel:  Recent Labs Lab 12/21/12 1455 12/22/12 0430 12/23/12 0510  NA 134* 138 137  K 4.3 4.2 4.0  CL 97 107 103  CO2 26 23 23   GLUCOSE 122* 136* 112*  BUN 18 11 9   CREATININE 0.86 0.97 0.78  CALCIUM 9.5 8.5 8.9   Liver Function Tests:  Recent Labs Lab 12/21/12 1455  AST 21  ALT 14  ALKPHOS 84  BILITOT 0.4  PROT 7.7  ALBUMIN 3.6    Recent Labs Lab 12/21/12 1455  LIPASE 31   No results found for this basename: AMMONIA,  in the last 168 hours CBC:  Recent Labs Lab 12/21/12 1455 12/22/12 0430 12/23/12 0510  WBC 17.2* 10.2 8.1  NEUTROABS 15.0*  --   --   HGB 14.7 12.2 13.3  HCT 42.5 34.6* 39.9  MCV 93.6 93.8 94.1  PLT 254 200 228   Cardiac Enzymes: No results found for this basename: CKTOTAL, CKMB, CKMBINDEX, TROPONINI,  in the last 168 hours BNP: No components found with this basename: POCBNP,  CBG: No results found for this basename: GLUCAP,  in the last 168 hours  No results found for this or any previous visit (from the past 240 hour(s)).   Scheduled Meds: . amitriptyline  50 mg Oral QHS  . atorvastatin  80 mg Oral Daily  . budesonide-formoterol  2 puff Inhalation BID  . heparin  5,000 Units Subcutaneous Q8H  . irbesartan  150 mg Oral Daily  .  levothyroxine  75 mcg Oral QAC breakfast  . nebivolol  5 mg Oral Daily  . pantoprazole (PROTONIX) IV  40 mg Intravenous QHS  . piperacillin-tazobactam (ZOSYN)  IV  3.375 g Intravenous Q8H   Continuous Infusions: . dextrose 5 % and 0.45 % NaCl with KCl 20 mEq/L 110 mL/hr at 12/22/12 1024     Belinda Schreier, DO  [facility] Pager [phone]  If 7PM-7AM, please contact night-coverage [email or web address] Password [email or web address] 12/23/2012, 1:51 PM   LOS: 2 days

## 2012-12-24 DIAGNOSIS — I10 Essential (primary) hypertension: Secondary | ICD-10-CM

## 2012-12-24 LAB — CBC
HCT: 37.3 % (ref 36.0–46.0)
MCH: 31.8 pg (ref 26.0–34.0)
MCV: 93.5 fL (ref 78.0–100.0)
Platelets: 262 10*3/uL (ref 150–400)
RBC: 3.99 MIL/uL (ref 3.87–5.11)
RDW: 12.2 % (ref 11.5–15.5)
WBC: 5 10*3/uL (ref 4.0–10.5)

## 2012-12-24 LAB — COMPREHENSIVE METABOLIC PANEL
AST: 20 U/L (ref 0–37)
BUN: 11 mg/dL (ref 6–23)
CO2: 19 mEq/L (ref 19–32)
Calcium: 8.8 mg/dL (ref 8.4–10.5)
Chloride: 104 mEq/L (ref 96–112)
Creatinine, Ser: 0.9 mg/dL (ref 0.50–1.10)
GFR calc Af Amer: 74 mL/min — ABNORMAL LOW (ref >90)
GFR calc non Af Amer: 64 mL/min — ABNORMAL LOW (ref >90)
Potassium: 4.4 mEq/L (ref 3.5–5.1)
Total Bilirubin: 0.4 mg/dL (ref 0.3–1.2)

## 2012-12-24 NOTE — Progress Notes (Signed)
Pt expressing that she is eager to have surgery to remove affected part of colon. Reinforced this by sharing with pt own personal experience with diverticulitis and colon resection, and subsequent good recovery. Clancy Leiner, [employer]

## 2012-12-24 NOTE — Progress Notes (Signed)
  Subjective: Feeling better. No abdominal pain. HA and nausea improved  Objective: Vital signs in last 24 hours: Temp:  [97.6 F (36.4 C)-98.3 F (36.8 C)] 97.6 F (36.4 C) (11/30 0520) Pulse Rate:  [58-71] 71 (11/30 0520) Resp:  [18] 18 (11/30 0520) BP: (116-137)/(71-84) 116/71 mmHg (11/30 0520) SpO2:  [94 %-98 %] 96 % (11/30 0520) Last BM Date: 12/22/12  Intake/Output from previous day: 11/29 0701 - 11/30 0700 In: 2699.2 [I.V.:2649.2; IV Piggyback:50] Out: 1250 [Urine:1250] Intake/Output this shift:    General appearance: alert, cooperative and no distress Resp: nonlabored Cardio: normal rate, regular GI: soft, NT, ND, no peritoneal signs  Lab Results:   Recent Labs  12/23/12 0510 12/24/12 0520  WBC 8.1 5.0  HGB 13.3 12.7  HCT 39.9 37.3  PLT 228 262   BMET  Recent Labs  12/23/12 0510 12/24/12 0520  NA 137 136  K 4.0 4.4  CL 103 104  CO2 23 19  GLUCOSE 112* 111*  BUN 9 11  CREATININE 0.78 0.90  CALCIUM 8.9 8.8   PT/INR No results found for this basename: LABPROT, INR,  in the last 72 hours ABG No results found for this basename: PHART, PCO2, PO2, HCO3,  in the last 72 hours  Studies/Results: No results found.  Anti-infectives: Anti-infectives   Start     Dose/Rate Route Frequency Ordered Stop   12/21/12 2200  piperacillin-tazobactam (ZOSYN) IVPB 3.375 g     3.375 g 12.5 mL/hr over 240 Minutes Intravenous 3 times per day 12/21/12 1956        Assessment/Plan: s/p * No surgery found * she continues to improve.  will advance diet today.  consider repeat CT tomorrow to evaluate for abscess.  If CT okay, then plan to change to oral meds and continue to advance diet in preparation for discharge likely on tuesday.  I discussed with her the options for elective colectomy vs. continued observation.  she will discuss with [facility] the options as an outpatient.  LOS: 3 days    Belinda Day DAVID 12/24/2012

## 2012-12-24 NOTE — Progress Notes (Signed)
[facility] PROGRESS NOTE  Belinda Day JYN:[id number] DOB: 1945-10-08 DOA: 12/21/2012 PCP: Lemont Fillers., NP  Assessment/Plan: Acute diverticulitis with microperforation  -Management per surgery  -Continue antibiotics  -plan to switch to po antibiotics before discharge -given intolerance to cipro/levoflox-->plan TMP/SMZ+Flagyl -WBCs are improved -medically stable to go to surgery if patient needs intervention  Coronary artery disease  -History of CABG 2002  -Stable without any symptoms  -restart ASA if no plans for surgery  -Continue nebivolol  -EKG unchanged compared to 12/2011, nonspecific changes  Hyperlipidemia  -Hold Lipitor for now  -plan to restart after d/c Bronchiectasis with nocturnal hypoxemia  -No hypoxemia during daytime  -Continue when necessary nighttime oxygen  -xopenex prn sob  -Clinically stable  -Continue Symbicort  Hypertension  -Continue ARB and nebivolol  -controlled  Family Communication: Pt at beside  Disposition Plan: Home when medically stable  Antibiotics:  Zosyn 12/21/12>>>          Procedures/Studies: Dg Chest 2 View  12/12/2012   CLINICAL DATA:  Bronchiectasis  EXAM: CHEST  2 VIEW  COMPARISON:  October 29, 2011  FINDINGS: There is postoperative change on the right, stable. Bronchiectatic change in the right middle lobe is stable. Scarring bilaterally in the lower lobes and right middle lobe remain stable. There is no edema or consolidation. There is no new opacity.  Heart size is within normal limits. Patient is status post coronary artery bypass grafting. The pulmonary vascularity is stable. There is diminished vascularity in the upper lobes, probably due to a degree of underlying emphysematous change. No adenopathy. No bone lesions.  IMPRESSION: Areas of scarring. Stable appearing bronchiectatic change right middle lobe. Underlying emphysematous change. No new opacity. No edema or consolidation.   Electronically Signed    By: Bretta Bang M.D.   On: 12/12/2012 09:22   Ct Abdomen Pelvis W Contrast  12/21/2012   CLINICAL DATA:  Left upper quadrant pain  EXAM: CT ABDOMEN AND PELVIS WITH CONTRAST  TECHNIQUE: Multidetector CT imaging of the abdomen and pelvis was performed using the standard protocol following bolus administration of intravenous contrast.  CONTRAST:  OMNIPAQUE IOHEXOL 300 MG/ML  SOLN  COMPARISON:  None.  FINDINGS: Lung bases shows scarring and mild bronchiectasis in right lower lobe posterior medially.  Sagittal images of the spine are unremarkable. No calcified gallstones are noted within gallbladder. Enhanced liver, spleen, pancreas and adrenal glands are unremarkable. Enhanced kidneys are symmetrical in size. No hydronephrosis or hydroureter. Delayed renal images shows bilateral renal symmetrical excretion. There is a small cyst in lower pole of the right kidney measures 8 mm. Atherosclerotic calcifications are noted abdominal aorta and iliac arteries.  Abundant stool noted in right colon. No pericecal inflammation. Normal appendix clearly visualized in axial image 52. Scattered diverticula are noted in left colon. There is significant stranding of pericolonic fat in left upper abdomen just anterior to the spleen, splenic flexure of the colon. There is significant thickening of diverticular wall. Findings are consistent with significant acute diverticulitis. This is best visualized in axial image 12 and sagittal image 83. There are small pockets of probable extraluminal air suspicious for a contained perforation. Mixed collection in axial image 13 containing fecal like material and some air suspicious for early diverticular abscess. There is focal thickening of colonic wall in axial image 18  Follow-up colonoscopy after appropriate treatment is recommended.  The uterus and adnexa are unremarkable. The urinary bladder is unremarkable. No pelvic ascites or adenopathy. No small bowel obstruction.  IMPRESSION: 1. 1. Scattered diverticula are noted in left colon. There is significant stranding of pericolonic fat in left upper abdomen just anterior to the spleen, splenic flexure of the colon. There is significant thickening of diverticular wall. Findings are consistent with significant acute diverticulitis. This is best visualized in axial image 12 and sagittal image 83. There are small pockets of probable extraluminal air suspicious for a contained perforation. Mixed collection in axial image 13 containing fecal like material and some air suspicious for early diverticular abscess. There is focal thickening of colonic wall in axial image 18  Follow-up colonoscopy after appropriate treatment is recommended.  2. No pericecal inflammation. Normal appendix. No hydronephrosis or hydroureter.   Electronically Signed   By: Natasha Mead M.D.   On: 12/21/2012 16:43         Subjective: Patient is feeling better. No vomiting today. Denies fevers, chills, chest pain, shortness breath, dysuria, hematuria. Abdominal pain continues to improve.  Objective: Filed Vitals:   12/23/12 1400 12/23/12 2130 12/24/12 0520 12/24/12 1400  BP: 137/72 134/84 116/71 131/76  Pulse: 58 64 71 62  Temp: 98.1 F (36.7 C) 98.3 F (36.8 C) 97.6 F (36.4 C) 98.8 F (37.1 C)  TempSrc: Oral Oral Oral Oral  Resp: 18 18 18 18   Height:      Weight:      SpO2: 98% 94% 96% 99%    Intake/Output Summary (Last 24 hours) at 12/24/12 1730 Last data filed at 12/24/12 1700  Gross per 24 hour  Intake 2601.5 ml  Output   1600 ml  Net 1001.5 ml   Weight change:  Exam:   General:  Pt is alert, follows commands appropriately, not in acute distress  HEENT: No icterus, No thrush,  [place]/AT  Cardiovascular: RRR, S1/S2, no rubs, no gallops  Respiratory: Bibasilar rales. Good air movement. No wheezing.  Abdomen: Soft/+BS, LLQ tender without peritoneal sign, non distended, no guarding  Extremities: trace LE edema, No lymphangitis,  No petechiae, No rashes, no synovitis  Data Reviewed: Basic Metabolic Panel:  Recent Labs Lab 12/21/12 1455 12/22/12 0430 12/23/12 0510 12/24/12 0520  NA 134* 138 137 136  K 4.3 4.2 4.0 4.4  CL 97 107 103 104  CO2 26 23 23 19   GLUCOSE 122* 136* 112* 111*  BUN 18 11 9 11   CREATININE 0.86 0.97 0.78 0.90  CALCIUM 9.5 8.5 8.9 8.8   Liver Function Tests:  Recent Labs Lab 12/21/12 1455 12/24/12 0520  AST 21 20  ALT 14 10  ALKPHOS 84 65  BILITOT 0.4 0.4  PROT 7.7 6.5  ALBUMIN 3.6 2.7*    Recent Labs Lab 12/21/12 1455  LIPASE 31   No results found for this basename: AMMONIA,  in the last 168 hours CBC:  Recent Labs Lab 12/21/12 1455 12/22/12 0430 12/23/12 0510 12/24/12 0520  WBC 17.2* 10.2 8.1 5.0  NEUTROABS 15.0*  --   --   --   HGB 14.7 12.2 13.3 12.7  HCT 42.5 34.6* 39.9 37.3  MCV 93.6 93.8 94.1 93.5  PLT 254 200 228 262   Cardiac Enzymes: No results found for this basename: CKTOTAL, CKMB, CKMBINDEX, TROPONINI,  in the last 168 hours BNP: No components found with this basename: POCBNP,  CBG: No results found for this basename: GLUCAP,  in the last 168 hours  No results found for this or any previous visit (from the past 240 hour(s)).   Scheduled Meds: . amitriptyline  50 mg Oral QHS  .  atorvastatin  80 mg Oral Daily  . budesonide-formoterol  2 puff Inhalation BID  . heparin  5,000 Units Subcutaneous Q8H  . irbesartan  150 mg Oral Daily  . levothyroxine  75 mcg Oral QAC breakfast  . nebivolol  5 mg Oral Daily  . pantoprazole (PROTONIX) IV  40 mg Intravenous QHS  . piperacillin-tazobactam (ZOSYN)  IV  3.375 g Intravenous Q8H   Continuous Infusions: . dextrose 5 % and 0.45 % NaCl with KCl 20 mEq/L 110 mL/hr (12/24/12 1337)     Marshal Eskew, DO  [facility] Pager [phone]  If 7PM-7AM, please contact night-coverage [email or web address] Password [email or web address] 12/24/2012, 5:30 PM   LOS: 3 days

## 2012-12-25 ENCOUNTER — Inpatient Hospital Stay (HOSPITAL_COMMUNITY): Payer: Medicare Other

## 2012-12-25 NOTE — Progress Notes (Signed)
[facility] PROGRESS NOTE  Belinda Day OZH:[id number] DOB: [birth year] DOA: 12/21/2012 PCP: Lemont Fillers., NP  Assessment/Plan: Acute diverticulitis with microperforation  -Management per surgery  -Continue antibiotics  -plan to switch to po antibiotics before discharge  -given intolerance to cipro/levoflox-->plan TMP/SMZ+Flagyl  -WBCs and pain are improved; tolerating clear liquids -medically stable to go to surgery if patient needs intervention  Coronary artery disease  -History of CABG 2002  -Stable without any symptoms  -restart ASA if no plans for surgery  -Continue nebivolol  -EKG unchanged compared to 12/2011, nonspecific changes  Hyperlipidemia  -Hold Lipitor for now  -plan to restart after d/c  Bronchiectasis with nocturnal hypoxemia  -No hypoxemia during daytime  -Continue when necessary nighttime oxygen  -xopenex prn sob  -Clinically stable  -Continue Symbicort  Hypertension  -Continue ARB and nebivolol  -controlled  Family Communication: Pt at beside  Disposition Plan: Home when medically stable  Antibiotics:  Zosyn 12/21/12>>>          Procedures/Studies: Dg Chest 2 View  12/12/2012   CLINICAL DATA:  Bronchiectasis  EXAM: CHEST  2 VIEW  COMPARISON:  October 29, 2011  FINDINGS: There is postoperative change on the right, stable. Bronchiectatic change in the right middle lobe is stable. Scarring bilaterally in the lower lobes and right middle lobe remain stable. There is no edema or consolidation. There is no new opacity.  Heart size is within normal limits. Patient is status post coronary artery bypass grafting. The pulmonary vascularity is stable. There is diminished vascularity in the upper lobes, probably due to a degree of underlying emphysematous change. No adenopathy. No bone lesions.  IMPRESSION: Areas of scarring. Stable appearing bronchiectatic change right middle lobe. Underlying emphysematous change. No new opacity. No edema or  consolidation.   Electronically Signed   By: Bretta Bang M.D.   On: 12/12/2012 09:22   Ct Abdomen Pelvis W Contrast  12/21/2012   CLINICAL DATA:  Left upper quadrant pain  EXAM: CT ABDOMEN AND PELVIS WITH CONTRAST  TECHNIQUE: Multidetector CT imaging of the abdomen and pelvis was performed using the standard protocol following bolus administration of intravenous contrast.  CONTRAST:  OMNIPAQUE IOHEXOL 300 MG/ML  SOLN  COMPARISON:  None.  FINDINGS: Lung bases shows scarring and mild bronchiectasis in right lower lobe posterior medially.  Sagittal images of the spine are unremarkable. No calcified gallstones are noted within gallbladder. Enhanced liver, spleen, pancreas and adrenal glands are unremarkable. Enhanced kidneys are symmetrical in size. No hydronephrosis or hydroureter. Delayed renal images shows bilateral renal symmetrical excretion. There is a small cyst in lower pole of the right kidney measures 8 mm. Atherosclerotic calcifications are noted abdominal aorta and iliac arteries.  Abundant stool noted in right colon. No pericecal inflammation. Normal appendix clearly visualized in axial image 52. Scattered diverticula are noted in left colon. There is significant stranding of pericolonic fat in left upper abdomen just anterior to the spleen, splenic flexure of the colon. There is significant thickening of diverticular wall. Findings are consistent with significant acute diverticulitis. This is best visualized in axial image 12 and sagittal image 83. There are small pockets of probable extraluminal air suspicious for a contained perforation. Mixed collection in axial image 13 containing fecal like material and some air suspicious for early diverticular abscess. There is focal thickening of colonic wall in axial image 18  Follow-up colonoscopy after appropriate treatment is recommended.  The uterus and adnexa are unremarkable. The urinary bladder is unremarkable. No pelvic ascites  or  adenopathy. No small bowel obstruction.  IMPRESSION: 1. 1. Scattered diverticula are noted in left colon. There is significant stranding of pericolonic fat in left upper abdomen just anterior to the spleen, splenic flexure of the colon. There is significant thickening of diverticular wall. Findings are consistent with significant acute diverticulitis. This is best visualized in axial image 12 and sagittal image 83. There are small pockets of probable extraluminal air suspicious for a contained perforation. Mixed collection in axial image 13 containing fecal like material and some air suspicious for early diverticular abscess. There is focal thickening of colonic wall in axial image 18  Follow-up colonoscopy after appropriate treatment is recommended.  2. No pericecal inflammation. Normal appendix. No hydronephrosis or hydroureter.   Electronically Signed   By: Natasha Mead M.D.   On: 12/21/2012 16:43         Subjective: Patient states her abdominal pain continues to improve., Chills, chest discomfort, nausea, vomiting, diarrhea, abdominal pain. She had a bowel movement today. No dysuria or hematuria. She is tolerating clear liquids.  Objective: Filed Vitals:   12/24/12 2045 12/25/12 0513 12/25/12 0837 12/25/12 1400  BP: 119/70 152/67  143/65  Pulse: 58 57  60  Temp: 98.5 F (36.9 C) 98 F (36.7 C)  98.3 F (36.8 C)  TempSrc: Oral Oral  Oral  Resp: 18 18  18   Height:      Weight:      SpO2: 99% 99% 97% 99%    Intake/Output Summary (Last 24 hours) at 12/25/12 1520 Last data filed at 12/25/12 1400  Gross per 24 hour  Intake 2547.25 ml  Output   2500 ml  Net  47.25 ml   Weight change:  Exam:   General:  Pt is alert, follows commands appropriately, not in acute distress  HEENT: No icterus, No thrush,  [place]/AT  Cardiovascular: RRR, S1/S2, no rubs, no gallops  Respiratory: Bibasilar rales, R>L. no wheezing. Good air movement.  Abdomen: Soft/+BS, LLQ tender without peritoneal signs,  non distended, no guarding  Extremities: trace LE edema, No lymphangitis, No petechiae, No rashes, no synovitis  Data Reviewed: Basic Metabolic Panel:  Recent Labs Lab 12/21/12 1455 12/22/12 0430 12/23/12 0510 12/24/12 0520  NA 134* 138 137 136  K 4.3 4.2 4.0 4.4  CL 97 107 103 104  CO2 26 23 23 19   GLUCOSE 122* 136* 112* 111*  BUN 18 11 9 11   CREATININE 0.86 0.97 0.78 0.90  CALCIUM 9.5 8.5 8.9 8.8   Liver Function Tests:  Recent Labs Lab 12/21/12 1455 12/24/12 0520  AST 21 20  ALT 14 10  ALKPHOS 84 65  BILITOT 0.4 0.4  PROT 7.7 6.5  ALBUMIN 3.6 2.7*    Recent Labs Lab 12/21/12 1455  LIPASE 31   No results found for this basename: AMMONIA,  in the last 168 hours CBC:  Recent Labs Lab 12/21/12 1455 12/22/12 0430 12/23/12 0510 12/24/12 0520  WBC 17.2* 10.2 8.1 5.0  NEUTROABS 15.0*  --   --   --   HGB 14.7 12.2 13.3 12.7  HCT 42.5 34.6* 39.9 37.3  MCV 93.6 93.8 94.1 93.5  PLT 254 200 228 262   Cardiac Enzymes: No results found for this basename: CKTOTAL, CKMB, CKMBINDEX, TROPONINI,  in the last 168 hours BNP: No components found with this basename: POCBNP,  CBG: No results found for this basename: GLUCAP,  in the last 168 hours  No results found for this or any previous visit (from  the past 240 hour(s)).   Scheduled Meds: . amitriptyline  50 mg Oral QHS  . atorvastatin  80 mg Oral Daily  . budesonide-formoterol  2 puff Inhalation BID  . heparin  5,000 Units Subcutaneous Q8H  . irbesartan  150 mg Oral Daily  . levothyroxine  75 mcg Oral QAC breakfast  . nebivolol  5 mg Oral Daily  . pantoprazole (PROTONIX) IV  40 mg Intravenous QHS  . piperacillin-tazobactam (ZOSYN)  IV  3.375 g Intravenous Q8H   Continuous Infusions: . dextrose 5 % and 0.45 % NaCl with KCl 20 mEq/L 75 mL/hr at 12/25/12 1102     Zayli Villafuerte, DO  [facility] Pager [phone]  If 7PM-7AM, please contact night-coverage [email or web address] Password [email or web address] 12/25/2012, 3:20  PM   LOS: 4 days

## 2012-12-25 NOTE — Progress Notes (Signed)
  Subjective: Feels good, pain is better she still feels something along the left side.  She has a hx of constipation and takes laxitives since age 67.  Followed by Dr. Ewing Schlein for GI.  Objective: Vital signs in last 24 hours: Temp:  [98 F (36.7 C)-98.8 F (37.1 C)] 98 F (36.7 C) (12/01 0513) Pulse Rate:  [57-62] 57 (12/01 0513) Resp:  [18] 18 (12/01 0513) BP: (119-152)/(67-76) 152/67 mmHg (12/01 0513) SpO2:  [97 %-99 %] 97 % (12/01 0837) Last BM Date: 12/22/12 Afebrile, VSS Labs OK yesterday Diet: clears; nothing recorded on I/O Intake/Output from previous day: 11/30 0701 - 12/01 0700 In: 2640 [I.V.:2640] Out: 2150 [Urine:2150] Intake/Output this shift:    General appearance: alert, cooperative and no distress Resp: clear to auscultation bilaterally GI: soft, non-tender; bowel sounds normal; no masses,  no organomegaly  Lab Results:   Recent Labs  12/23/12 0510 12/24/12 0520  WBC 8.1 5.0  HGB 13.3 12.7  HCT 39.9 37.3  PLT 228 262    BMET  Recent Labs  12/23/12 0510 12/24/12 0520  NA 137 136  K 4.0 4.4  CL 103 104  CO2 23 19  GLUCOSE 112* 111*  BUN 9 11  CREATININE 0.78 0.90  CALCIUM 8.9 8.8   PT/INR No results found for this basename: LABPROT, INR,  in the last 72 hours   Recent Labs Lab 12/21/12 1455 12/24/12 0520  AST 21 20  ALT 14 10  ALKPHOS 84 65  BILITOT 0.4 0.4  PROT 7.7 6.5  ALBUMIN 3.6 2.7*     Lipase     Component Value Date/Time   LIPASE 31 12/21/2012 1455     Studies/Results: No results found.  Medications: . amitriptyline  50 mg Oral QHS  . atorvastatin  80 mg Oral Daily  . budesonide-formoterol  2 puff Inhalation BID  . heparin  5,000 Units Subcutaneous Q8H  . irbesartan  150 mg Oral Daily  . levothyroxine  75 mcg Oral QAC breakfast  . nebivolol  5 mg Oral Daily  . pantoprazole (PROTONIX) IV  40 mg Intravenous QHS  . piperacillin-tazobactam (ZOSYN)  IV  3.375 g Intravenous Q8H    Assessment/Plan Acute  diverticulitis with microperforation  Coronary artery disease s/p CABG 2002  Bronchiectasis with nocturnal hypoxemia  Hypertension  Hyperlipidemia  Hypothyroid   Plan: she looks good.  Continue IV zosyn, walk more, Clears and repeat CT tomorrow it will be 5 days since her first CT on 12/21/12.  If she is doing well advance her PO's and consider change to PO antibiotics.  This sounds like her first episode of Diverticulitis.    LOS: 4 days    Chelci [place] 12/25/2012

## 2012-12-26 ENCOUNTER — Encounter (HOSPITAL_COMMUNITY): Payer: Self-pay | Admitting: Radiology

## 2012-12-26 ENCOUNTER — Inpatient Hospital Stay (HOSPITAL_COMMUNITY): Payer: Medicare Other

## 2012-12-26 ENCOUNTER — Ambulatory Visit (HOSPITAL_COMMUNITY): Payer: Medicare Other

## 2012-12-26 DIAGNOSIS — E785 Hyperlipidemia, unspecified: Secondary | ICD-10-CM

## 2012-12-26 LAB — BASIC METABOLIC PANEL
CO2: 23 mEq/L (ref 19–32)
Calcium: 9.5 mg/dL (ref 8.4–10.5)
Chloride: 108 mEq/L (ref 96–112)
GFR calc Af Amer: 69 mL/min — ABNORMAL LOW (ref >90)
Glucose, Bld: 96 mg/dL (ref 70–99)
Potassium: 4.3 mEq/L (ref 3.5–5.1)
Sodium: 141 mEq/L (ref 135–145)

## 2012-12-26 LAB — CBC
Hemoglobin: 13.7 g/dL (ref 12.0–15.0)
MCH: 31.9 pg (ref 26.0–34.0)
Platelets: 286 10*3/uL (ref 150–400)
RBC: 4.3 MIL/uL (ref 3.87–5.11)
WBC: 4.6 10*3/uL (ref 4.0–10.5)

## 2012-12-26 MED ORDER — IOHEXOL 300 MG/ML  SOLN: 25.0000 mL | INTRAMUSCULAR | Status: AC

## 2012-12-26 MED ORDER — IOHEXOL 300 MG/ML  SOLN
100.0000 mL | Freq: Once | INTRAMUSCULAR | Status: AC | PRN
  Administered 2012-12-26: 100 mL via INTRAVENOUS

## 2012-12-26 NOTE — Progress Notes (Signed)
  Subjective: She had more LUQ discomfort this Am, it is better now and she is trying to get contrast down.  Objective: Vital signs in last 24 hours: Temp:  [97.7 F (36.5 C)-98.3 F (36.8 C)] 97.7 F (36.5 C) (12/02 0510) Pulse Rate:  [54-60] 54 (12/02 0510) Resp:  [18] 18 (12/02 0510) BP: (134-147)/(65-81) 134/81 mmHg (12/02 0510) SpO2:  [99 %-100 %] 100 % (12/02 0510) Last BM Date: 12/22/12 960 PO No BM recorded Afebrile, VSS Labs are good CT is pending Diet: clears Intake/Output from previous day: 12/01 0701 - 12/02 0700 In: 3277.3 [P.O.:960; I.V.:1917.3; IV Piggyback:400] Out: 3450 [Urine:3450] Intake/Output this shift:    General appearance: alert, cooperative and no distress Resp: clear to auscultation bilaterally GI: soft no distension, pain is in the LUQ.  Lab Results:   Recent Labs  12/24/12 0520 12/26/12 0605  WBC 5.0 4.6  HGB 12.7 13.7  HCT 37.3 39.6  PLT 262 286    BMET  Recent Labs  12/24/12 0520 12/26/12 0605  NA 136 141  K 4.4 4.3  CL 104 108  CO2 19 23  GLUCOSE 111* 96  BUN 11 6  CREATININE 0.90 0.95  CALCIUM 8.8 9.5   PT/INR No results found for this basename: LABPROT, INR,  in the last 72 hours   Recent Labs Lab 12/21/12 1455 12/24/12 0520  AST 21 20  ALT 14 10  ALKPHOS 84 65  BILITOT 0.4 0.4  PROT 7.7 6.5  ALBUMIN 3.6 2.7*     Lipase     Component Value Date/Time   LIPASE 31 12/21/2012 1455     Studies/Results: No results found.  Medications: . amitriptyline  50 mg Oral QHS  . atorvastatin  80 mg Oral Daily  . budesonide-formoterol  2 puff Inhalation BID  . heparin  5,000 Units Subcutaneous Q8H  . iohexol  25 mL Oral Q1 Hr x 2  . irbesartan  150 mg Oral Daily  . levothyroxine  75 mcg Oral QAC breakfast  . nebivolol  5 mg Oral Daily  . pantoprazole (PROTONIX) IV  40 mg Intravenous QHS  . piperacillin-tazobactam (ZOSYN)  IV  3.375 g Intravenous Q8H    Assessment/Plan Acute diverticulitis with  microperforation  Coronary artery disease s/p CABG 2002  Bronchiectasis with nocturnal hypoxemia  Hypertension  Hyperlipidemia  Hypothyroid   Plan:  CT scan pending now.     LOS: 5 days    Shaylan Tutton 12/26/2012

## 2012-12-26 NOTE — Progress Notes (Signed)
[facility] PROGRESS NOTE  Belinda Day ZOX:[id number] DOB: January 02, 1946 DOA: 12/21/2012 PCP: Lemont Fillers., NP  Assessment/Plan: Acute diverticulitis with microperforation  -Surgery on board, awaiting further recommendations.  Plan is for repeat CT of abdomen  -Continue current antibiotics  -plan to switch to po antibiotics before discharge  -given intolerance to cipro/levoflox-->plan TMP/SMZ+Flagyl  -WBCs and pain are improved; tolerating clear liquids  -medically stable to go to surgery if patient needs intervention   Coronary artery disease  -History of CABG 2002  -Stable without any symptoms  -restart ASA if no plans for surgery  -Continue nebivolol  -EKG unchanged compared to 12/2011, nonspecific changes   Hyperlipidemia  -Hold Lipitor for now  -plan to restart after d/c  - stable  Bronchiectasis with nocturnal hypoxemia  -No hypoxemia during daytime  -Continue when necessary nighttime oxygen  -xopenex prn sob  -Clinically stable  -Continue Symbicort   Hypertension  -Continue ARB and nebivolol  -controlled   Family Communication: Pt at beside  Disposition Plan: With continued improvement in condition    Consultants:  General surgery: Lodema Pilot  Procedures:  none  Antibiotics:  Zosyn  HPI/Subjective: Patient has no new complaints. Feels better today reportedly.   Objective: Filed Vitals:   12/26/12 1416  BP: 131/72  Pulse: 54  Temp: 98.2 F (36.8 C)  Resp: 20    Intake/Output Summary (Last 24 hours) at 12/26/12 1422 Last data filed at 12/26/12 1400  Gross per 24 hour  Intake   2370 ml  Output   2750 ml  Net   -380 ml   Filed Weights   12/21/12 1950 12/21/12 2300  Weight: 60.102 kg (132 lb 8 oz) 60.102 kg (132 lb 8 oz)    Exam:   General:  Pt in NAD, Alert and Awake  Cardiovascular: RRR, no MRG  Respiratory: CTA BL, no wheezes  Abdomen: soft, hypoactive bowel sounds  Musculoskeletal: no cyanosis or  clubbing.   Data Reviewed: Basic Metabolic Panel:  Recent Labs Lab 12/21/12 1455 12/22/12 0430 12/23/12 0510 12/24/12 0520 12/26/12 0605  NA 134* 138 137 136 141  K 4.3 4.2 4.0 4.4 4.3  CL 97 107 103 104 108  CO2 26 23 23 19 23   GLUCOSE 122* 136* 112* 111* 96  BUN 18 11 9 11 6   CREATININE 0.86 0.97 0.78 0.90 0.95  CALCIUM 9.5 8.5 8.9 8.8 9.5   Liver Function Tests:  Recent Labs Lab 12/21/12 1455 12/24/12 0520  AST 21 20  ALT 14 10  ALKPHOS 84 65  BILITOT 0.4 0.4  PROT 7.7 6.5  ALBUMIN 3.6 2.7*    Recent Labs Lab 12/21/12 1455  LIPASE 31   No results found for this basename: AMMONIA,  in the last 168 hours CBC:  Recent Labs Lab 12/21/12 1455 12/22/12 0430 12/23/12 0510 12/24/12 0520 12/26/12 0605  WBC 17.2* 10.2 8.1 5.0 4.6  NEUTROABS 15.0*  --   --   --   --   HGB 14.7 12.2 13.3 12.7 13.7  HCT 42.5 34.6* 39.9 37.3 39.6  MCV 93.6 93.8 94.1 93.5 92.1  PLT 254 200 228 262 286   Cardiac Enzymes: No results found for this basename: CKTOTAL, CKMB, CKMBINDEX, TROPONINI,  in the last 168 hours BNP (last 3 results) No results found for this basename: PROBNP,  in the last 8760 hours CBG: No results found for this basename: GLUCAP,  in the last 168 hours  No results found for this or any previous visit (from  the past 240 hour(s)).   Studies: No results found.  Scheduled Meds: . amitriptyline  50 mg Oral QHS  . atorvastatin  80 mg Oral Daily  . budesonide-formoterol  2 puff Inhalation BID  . heparin  5,000 Units Subcutaneous Q8H  . irbesartan  150 mg Oral Daily  . levothyroxine  75 mcg Oral QAC breakfast  . nebivolol  5 mg Oral Daily  . pantoprazole (PROTONIX) IV  40 mg Intravenous QHS  . piperacillin-tazobactam (ZOSYN)  IV  3.375 g Intravenous Q8H   Continuous Infusions: . dextrose 5 % and 0.45 % NaCl with KCl 20 mEq/L 75 mL/hr at 12/25/12 1102    Principal Problem:   Diverticulitis Active Problems:   HYPERTENSION   CAD   BRONCHIECTASIS    Hypothyroidism   Nocturnal hypoxemia   Acute diverticulitis    Time spent: > 35 minutes    Penny Pia  [facility] Pager [phone]. If 7PM-7AM, please contact night-coverage at [email or web address], password [facility] 12/26/2012, 2:22 PM  LOS: 5 days

## 2012-12-27 ENCOUNTER — Encounter (HOSPITAL_COMMUNITY): Payer: Self-pay | Admitting: *Deleted

## 2012-12-27 DIAGNOSIS — Z8679 Personal history of other diseases of the circulatory system: Secondary | ICD-10-CM

## 2012-12-27 MED ORDER — ASPIRIN EC 81 MG PO TBEC
81.0000 mg | DELAYED_RELEASE_TABLET | Freq: Every day | ORAL | Status: DC
  Administered 2012-12-27: 81 mg via ORAL
  Filled 2012-12-27 (×2): qty 1

## 2012-12-27 MED ORDER — HYDROCODONE-ACETAMINOPHEN 5-325 MG PO TABS: 0.5000 | ORAL_TABLET | Freq: Four times a day (QID) | ORAL | Status: DC | PRN

## 2012-12-27 NOTE — Progress Notes (Addendum)
[facility] PROGRESS NOTE  Belinda Day JXB:[id number] DOB: 04/28/1945 DOA: 12/21/2012 PCP: Lemont Fillers., NP  Narrative: -Patient is a 67 year old Caucasian female who presented with acute diverticulitis with microperforation. General surgery currently primary and plan is to continue IV antibiotics (Zosyn). Repeat CT done 12/26/2012 reported acute diverticulitis of the proximal descending colon with a tiny adjacent pericolic diverticular abscess. We have been consult at for management of medical problems at this juncture.  Assessment/Plan: Acute diverticulitis with microperforation  -Surgery on board, awaiting further recommendations.  Plan is for repeat CT of abdomen  -At this point plan is to continue IV antibiotics. -plan to switch to po antibiotics before discharge  -given intolerance to cipro/levoflox-->plan TMP/SMZ+Flagyl once ready to transition oral regimen -WBCs and pain are improved; tolerating clear liquids  -medically stable to go to surgery if patient needs intervention   Coronary artery disease  -History of CABG 2002  -Stable without any symptoms  -restarted ASA if since no plans for surgery  -Continue nebivolol  -EKG unchanged compared to 12/2011, nonspecific changes   Hyperlipidemia  -Hold Lipitor for now  -plan to restart after d/c  - stable  Bronchiectasis with nocturnal hypoxemia  -No hypoxemia during daytime  -Continue when necessary nighttime oxygen  -xopenex prn sob  -Clinically stable  -Continue Symbicort   Hypertension  -Continue ARB and nebivolol  -controlled currently  Addendum Hypothyroidism - At this point since patient is tolerating clears will go ahead and place back on her synthroid.  Family Communication: Pt at beside  Disposition Plan: With continued improvement in condition    Consultants:  General surgery: Lodema Pilot  Procedures:  none  Antibiotics:  Zosyn  HPI/Subjective: Patient has no new  complaints. Feels better today reportedly. Reports abdominal discomfort has ameliorated.  Objective: Filed Vitals:   12/27/12 0602  BP: 115/71  Pulse: 62  Temp: 98.1 F (36.7 C)  Resp: 16    Intake/Output Summary (Last 24 hours) at 12/27/12 1012 Last data filed at 12/27/12 1000  Gross per 24 hour  Intake   2310 ml  Output   2750 ml  Net   -440 ml   Filed Weights   12/21/12 1950 12/21/12 2300  Weight: 60.102 kg (132 lb 8 oz) 60.102 kg (132 lb 8 oz)    Exam:   General:  Pt in NAD, Alert and Awake  Cardiovascular: RRR, no MRG  Respiratory: CTA BL, no wheezes  Abdomen: soft, hypoactive bowel sounds  Musculoskeletal: no cyanosis or clubbing.   Data Reviewed: Basic Metabolic Panel:  Recent Labs Lab 12/21/12 1455 12/22/12 0430 12/23/12 0510 12/24/12 0520 12/26/12 0605  NA 134* 138 137 136 141  K 4.3 4.2 4.0 4.4 4.3  CL 97 107 103 104 108  CO2 26 23 23 19 23   GLUCOSE 122* 136* 112* 111* 96  BUN 18 11 9 11 6   CREATININE 0.86 0.97 0.78 0.90 0.95  CALCIUM 9.5 8.5 8.9 8.8 9.5   Liver Function Tests:  Recent Labs Lab 12/21/12 1455 12/24/12 0520  AST 21 20  ALT 14 10  ALKPHOS 84 65  BILITOT 0.4 0.4  PROT 7.7 6.5  ALBUMIN 3.6 2.7*    Recent Labs Lab 12/21/12 1455  LIPASE 31   No results found for this basename: AMMONIA,  in the last 168 hours CBC:  Recent Labs Lab 12/21/12 1455 12/22/12 0430 12/23/12 0510 12/24/12 0520 12/26/12 0605  WBC 17.2* 10.2 8.1 5.0 4.6  NEUTROABS 15.0*  --   --   --   --  HGB 14.7 12.2 13.3 12.7 13.7  HCT 42.5 34.6* 39.9 37.3 39.6  MCV 93.6 93.8 94.1 93.5 92.1  PLT 254 200 228 262 286   Cardiac Enzymes: No results found for this basename: CKTOTAL, CKMB, CKMBINDEX, TROPONINI,  in the last 168 hours BNP (last 3 results) No results found for this basename: PROBNP,  in the last 8760 hours CBG: No results found for this basename: GLUCAP,  in the last 168 hours  No results found for this or any previous visit  (from the past 240 hour(s)).   Studies: Ct Abdomen Pelvis W Contrast  12/26/2012   CLINICAL DATA:  Acute diverticulitis with microperforation, left upper quadrant pain, nausea, vomiting, diarrhea, past history coronary artery disease, hypertension  EXAM: CT ABDOMEN AND PELVIS WITH CONTRAST  TECHNIQUE: Multidetector CT imaging of the abdomen and pelvis was performed using the standard protocol following bolus administration of intravenous contrast. Sagittal and coronal MPR images reconstructed from axial data set.  CONTRAST:  Dilute oral contrast.  100 cc Omnipaque 300 IV.  COMPARISON:  None  FINDINGS: Peribronchial thickening and volume loss right lower lobe.  Calcified granulomata at both lung bases.  Pleural calcifications at right lung base adjacent to right atrium.  Tiny cyst inferior pole right kidney.  Liver, spleen, pancreas, kidneys, and adrenal glands otherwise normal appearance.  Descending and sigmoid diverticula identified with focal wall thickening of the colon at the proximal descending colon associated with pericolic infiltrative/inflammatory changes compatible with acute diverticulitis.  Tiny adjacent focal fluid collection 11 x 10 x 8 mm compatible with abscess.  No definite free intraperitoneal air identified.  Stomach and remaining bowel loops normal appearance.  Bones diffusely demineralized.  No mass, adenopathy, or free fluid.  No acute osseous findings.  IMPRESSION: Acute diverticulitis at the proximal descending colon with a tiny adjacent pericolic diverticular abscess measuring 11 x 10 x 8 mm in size.  Diverticulosis of the descending and proximal sigmoid colon.  Old granulomatous disease at both lung bases with right lower lobe volume loss and right pleural calcification.   Electronically Signed   By: Ulyses Southward M.D.   On: 12/26/2012 14:22    Scheduled Meds: . amitriptyline  50 mg Oral QHS  . atorvastatin  80 mg Oral Daily  . budesonide-formoterol  2 puff Inhalation BID  .  heparin  5,000 Units Subcutaneous Q8H  . irbesartan  150 mg Oral Daily  . levothyroxine  75 mcg Oral QAC breakfast  . nebivolol  5 mg Oral Daily  . pantoprazole (PROTONIX) IV  40 mg Intravenous QHS  . piperacillin-tazobactam (ZOSYN)  IV  3.375 g Intravenous Q8H   Continuous Infusions: . dextrose 5 % and 0.45 % NaCl with KCl 20 mEq/L 75 mL/hr at 12/26/12 2333    Principal Problem:   Diverticulitis Active Problems:   HYPERTENSION   CAD   BRONCHIECTASIS   Hypothyroidism   Nocturnal hypoxemia   Acute diverticulitis    Time spent: > 35 minutes    Belinda Day  [facility] Pager [phone]. If 7PM-7AM, please contact night-coverage at [email or web address], password [facility] 12/27/2012, 10:12 AM  LOS: 6 days

## 2012-12-27 NOTE — Progress Notes (Signed)
Subjective: Less pain today, had diarrhea this am.  Denies n/v.  Ambulating in hallways.    Objective: Vital signs in last 24 hours: Temp:  [98.1 F (36.7 C)-98.2 F (36.8 C)] 98.1 F (36.7 C) (12/03 0602) Pulse Rate:  [54-62] 62 (12/03 0602) Resp:  [16-20] 16 (12/03 0602) BP: (115-134)/(61-72) 115/71 mmHg (12/03 0602) SpO2:  [97 %-100 %] 97 % (12/03 1004) Last BM Date: 12/22/12  Intake/Output from previous day: 12/02 0701 - 12/03 0700 In: 2313.8 [P.O.:360; I.V.:1803.8; IV Piggyback:150] Out: 3050 [Urine:3050] Intake/Output this shift: Total I/O In: 416.3 [P.O.:120; I.V.:296.3] Out: 600 [Urine:600]  PE General appearance: alert, cooperative and no distress  Resp: clear to auscultation bilaterally  GI: soft no distension, minimal TTP LLQ   Lab Results:   Recent Labs  12/26/12 0605  WBC 4.6  HGB 13.7  HCT 39.6  PLT 286   BMET  Recent Labs  12/26/12 0605  NA 141  K 4.3  CL 108  CO2 23  GLUCOSE 96  BUN 6  CREATININE 0.95  CALCIUM 9.5   PT/INR No results found for this basename: LABPROT, INR,  in the last 72 hours ABG No results found for this basename: PHART, PCO2, PO2, HCO3,  in the last 72 hours  Studies/Results: Ct Abdomen Pelvis W Contrast  12/26/2012   CLINICAL DATA:  Acute diverticulitis with microperforation, left upper quadrant pain, nausea, vomiting, diarrhea, past history coronary artery disease, hypertension  EXAM: CT ABDOMEN AND PELVIS WITH CONTRAST  TECHNIQUE: Multidetector CT imaging of the abdomen and pelvis was performed using the standard protocol following bolus administration of intravenous contrast. Sagittal and coronal MPR images reconstructed from axial data set.  CONTRAST:  Dilute oral contrast.  100 cc Omnipaque 300 IV.  COMPARISON:  None  FINDINGS: Peribronchial thickening and volume loss right lower lobe.  Calcified granulomata at both lung bases.  Pleural calcifications at right lung base adjacent to right atrium.  Tiny cyst  inferior pole right kidney.  Liver, spleen, pancreas, kidneys, and adrenal glands otherwise normal appearance.  Descending and sigmoid diverticula identified with focal wall thickening of the colon at the proximal descending colon associated with pericolic infiltrative/inflammatory changes compatible with acute diverticulitis.  Tiny adjacent focal fluid collection 11 x 10 x 8 mm compatible with abscess.  No definite free intraperitoneal air identified.  Stomach and remaining bowel loops normal appearance.  Bones diffusely demineralized.  No mass, adenopathy, or free fluid.  No acute osseous findings.  IMPRESSION: Acute diverticulitis at the proximal descending colon with a tiny adjacent pericolic diverticular abscess measuring 11 x 10 x 8 mm in size.  Diverticulosis of the descending and proximal sigmoid colon.  Old granulomatous disease at both lung bases with right lower lobe volume loss and right pleural calcification.   Electronically Signed   By: Ulyses Southward M.D.   On: 12/26/2012 14:22    Anti-infectives: Anti-infectives   Start     Dose/Rate Route Frequency Ordered Stop   12/21/12 2200  piperacillin-tazobactam (ZOSYN) IVPB 3.375 g     3.375 g 12.5 mL/hr over 240 Minutes Intravenous 3 times per day 12/21/12 1956        Assessment/Plan: Acute diverticulitis with microperforation  Coronary artery disease s/p CABG 2002  Bronchiectasis with nocturnal hypoxemia  Hypertension  Hyperlipidemia  Hypothyroid  Plan:: CT scan shows a small abscess, this is unable to be perc drained.  We will continue with medical management, IV antibiotics.  May be able to advance diet, but will hold  off until she is seen by Dr. Daphine Deutscher.  She is tolerating [place] diet at present time.    She will need a outpatient colonoscopy in approximately 6 weeks Add norco for pain, zofran for nausea Ambulate    LOS: 6 days    Adlee Paar ANP-BC 12/27/2012 12:13 PM

## 2012-12-28 DIAGNOSIS — K625 Hemorrhage of anus and rectum: Secondary | ICD-10-CM | POA: Diagnosis not present

## 2012-12-28 LAB — CBC
Hemoglobin: 14.8 g/dL (ref 12.0–15.0)
MCH: 31.4 pg (ref 26.0–34.0)
MCHC: 34 g/dL (ref 30.0–36.0)
Platelets: 317 10*3/uL (ref 150–400)
Platelets: 396 10*3/uL (ref 150–400)
RBC: 4.72 MIL/uL (ref 3.87–5.11)
RDW: 12.4 % (ref 11.5–15.5)
WBC: 6 10*3/uL (ref 4.0–10.5)
WBC: 6.5 10*3/uL (ref 4.0–10.5)

## 2012-12-28 MED ORDER — SACCHAROMYCES BOULARDII 250 MG PO CAPS
250.0000 mg | ORAL_CAPSULE | Freq: Two times a day (BID) | ORAL | Status: DC
  Administered 2012-12-28 – 2012-12-29 (×2): 250 mg via ORAL
  Filled 2012-12-28 (×3): qty 1

## 2012-12-28 MED ORDER — HYDROCORTISONE ACETATE 25 MG RE SUPP
25.0000 mg | Freq: Two times a day (BID) | RECTAL | Status: DC
  Filled 2012-12-28 (×4): qty 1

## 2012-12-28 MED ORDER — AMOXICILLIN-POT CLAVULANATE 875-125 MG PO TABS
1.0000 | ORAL_TABLET | Freq: Two times a day (BID) | ORAL | Status: DC
  Administered 2012-12-28 – 2012-12-29 (×3): 1 via ORAL
  Filled 2012-12-28 (×4): qty 1

## 2012-12-28 NOTE — Progress Notes (Addendum)
[facility] CONSULT NOTE/PROGRESS NOTE  SRUTI AYLLON UEA:[id number] DOB: 12/01/1945 DOA: 12/21/2012 PCP: Lemont Fillers., NP  Narrative: -Patient is a 67 year old Caucasian female who presented with acute diverticulitis with microperforation. General surgery currently primary and plan is to continue IV antibiotics (Zosyn). Repeat CT done 12/26/2012 reported acute diverticulitis of the proximal descending colon with a tiny adjacent pericolic diverticular abscess. We have been consult at for management of medical problems at this juncture.  Assessment/Plan: Acute diverticulitis with microperforation and diverticular abscess Patient with clinical improvement. No abdominal pain. No nausea, no vomiting. Patient with bright red blood parenchyma and this morning x2. -Repeat CT of abdomen/pelvis with acute diverticulitis of the proximal descending colon with tiny adjacent pericolic diverticular abscess measuring 11 x 10 x 8 mm in size. Diverticulosis of the descending and proximal sigmoid colon. -Patient tolerating clear liquids and asking for diet to be advanced however will defer to primary team. Continue IV antibiotics will likely need to be switched to oral antibiotics prior to discharge. Will likely need outpatient colonoscopy in 6 weeks. Continue pain management and supportive care. -plan to switch to po antibiotics before discharge  -given intolerance to cipro/levoflox-->plan TMP/SMZ+Flagyl once ready to transition oral regimen -WBCs and pain are improved; tolerating clear liquids  -medically stable to go to surgery if patient needs intervention     Bright red blood per rectum Patient with 2 bloody bowel movements this morning. Patient denies any abdominal pain. No nausea. No vomiting. Likely secondary to diverticular bleed. Will check a CBC this afternoon. Discontinue heparin. Discontinue aspirin. Follow H&H. If continued bleeding and drop in hemoglobin may consider a GI  consultation.  Coronary artery disease  -History of CABG 2002  -Stable without any symptoms  -Hold aspirin secondary to bright red blood parenchyma and this morning. -Continue nebivolol  -EKG unchanged compared to 12/2011, nonspecific changes   Hyperlipidemia  -Hold Lipitor for now  -plan to restart after d/c  - stable  Bronchiectasis with nocturnal hypoxemia  -No hypoxemia during daytime  -Continue when necessary nighttime oxygen  -xopenex prn sob  -Clinically stable  -Continue Symbicort   Hypertension  -Continue ARB and nebivolol  -controlled currently  Hypothyroidism - Continue Synthroid.   Prophylaxis PPI for GI prophylaxis. SCDs for DVT prophylaxis.  Family Communication: Updated patient and friend at bedside.  Disposition Plan: Once medically stable in per primary team.   Consultants:  General surgery: Lodema Pilot  Procedures:  CT of the abdomen and pelvis  12/21/2012, 12/26/2012  Antibiotics:  IV Zosyn 12/21/2012  HPI/Subjective: Patient states abdominal pain has improved. No nausea. No vomiting. Patient states ambulating in the hallway without any problems. Patient states had 2 bloody bright red stools this morning that filled the commode.  Objective: Filed Vitals:   12/28/12 0500  BP: 138/73  Pulse: 55  Temp: 97.9 F (36.6 C)  Resp: 18    Intake/Output Summary (Last 24 hours) at 12/28/12 1013 Last data filed at 12/28/12 0000  Gross per 24 hour  Intake   2110 ml  Output   1800 ml  Net    310 ml   Filed Weights   12/21/12 1950 12/21/12 2300  Weight: 60.102 kg (132 lb 8 oz) 60.102 kg (132 lb 8 oz)    Exam:   General:  Pt in NAD, Alert and Awake  Cardiovascular: RRR, no MRG  Respiratory: CTA BL, no wheezes  Abdomen: soft, positive bowel sounds, nondistended, nontender to palpation.  Musculoskeletal: no cyanosis or clubbing.  Data Reviewed: Basic Metabolic Panel:  Recent Labs Lab 12/21/12 1455 12/22/12 0430  12/23/12 0510 12/24/12 0520 12/26/12 0605  NA 134* 138 137 136 141  K 4.3 4.2 4.0 4.4 4.3  CL 97 107 103 104 108  CO2 26 23 23 19 23   GLUCOSE 122* 136* 112* 111* 96  BUN 18 11 9 11 6   CREATININE 0.86 0.97 0.78 0.90 0.95  CALCIUM 9.5 8.5 8.9 8.8 9.5   Liver Function Tests:  Recent Labs Lab 12/21/12 1455 12/24/12 0520  AST 21 20  ALT 14 10  ALKPHOS 84 65  BILITOT 0.4 0.4  PROT 7.7 6.5  ALBUMIN 3.6 2.7*    Recent Labs Lab 12/21/12 1455  LIPASE 31   No results found for this basename: AMMONIA,  in the last 168 hours CBC:  Recent Labs Lab 12/21/12 1455 12/22/12 0430 12/23/12 0510 12/24/12 0520 12/26/12 0605 12/28/12 0502  WBC 17.2* 10.2 8.1 5.0 4.6 6.0  NEUTROABS 15.0*  --   --   --   --   --   HGB 14.7 12.2 13.3 12.7 13.7 13.3  HCT 42.5 34.6* 39.9 37.3 39.6 39.1  MCV 93.6 93.8 94.1 93.5 92.1 92.4  PLT 254 200 228 262 286 317   Cardiac Enzymes: No results found for this basename: CKTOTAL, CKMB, CKMBINDEX, TROPONINI,  in the last 168 hours BNP (last 3 results) No results found for this basename: PROBNP,  in the last 8760 hours CBG: No results found for this basename: GLUCAP,  in the last 168 hours  No results found for this or any previous visit (from the past 240 hour(s)).   Studies: Ct Abdomen Pelvis W Contrast  12/26/2012   CLINICAL DATA:  Acute diverticulitis with microperforation, left upper quadrant pain, nausea, vomiting, diarrhea, past history coronary artery disease, hypertension  EXAM: CT ABDOMEN AND PELVIS WITH CONTRAST  TECHNIQUE: Multidetector CT imaging of the abdomen and pelvis was performed using the standard protocol following bolus administration of intravenous contrast. Sagittal and coronal MPR images reconstructed from axial data set.  CONTRAST:  Dilute oral contrast.  100 cc Omnipaque 300 IV.  COMPARISON:  None  FINDINGS: Peribronchial thickening and volume loss right lower lobe.  Calcified granulomata at both lung bases.  Pleural  calcifications at right lung base adjacent to right atrium.  Tiny cyst inferior pole right kidney.  Liver, spleen, pancreas, kidneys, and adrenal glands otherwise normal appearance.  Descending and sigmoid diverticula identified with focal wall thickening of the colon at the proximal descending colon associated with pericolic infiltrative/inflammatory changes compatible with acute diverticulitis.  Tiny adjacent focal fluid collection 11 x 10 x 8 mm compatible with abscess.  No definite free intraperitoneal air identified.  Stomach and remaining bowel loops normal appearance.  Bones diffusely demineralized.  No mass, adenopathy, or free fluid.  No acute osseous findings.  IMPRESSION: Acute diverticulitis at the proximal descending colon with a tiny adjacent pericolic diverticular abscess measuring 11 x 10 x 8 mm in size.  Diverticulosis of the descending and proximal sigmoid colon.  Old granulomatous disease at both lung bases with right lower lobe volume loss and right pleural calcification.   Electronically Signed   By: Ulyses Southward M.D.   On: 12/26/2012 14:22    Scheduled Meds: . amitriptyline  50 mg Oral QHS  . aspirin EC  81 mg Oral Daily  . atorvastatin  80 mg Oral Daily  . budesonide-formoterol  2 puff Inhalation BID  . heparin  5,000 Units Subcutaneous  Q8H  . irbesartan  150 mg Oral Daily  . levothyroxine  75 mcg Oral QAC breakfast  . nebivolol  5 mg Oral Daily  . pantoprazole (PROTONIX) IV  40 mg Intravenous QHS  . piperacillin-tazobactam (ZOSYN)  IV  3.375 g Intravenous Q8H   Continuous Infusions: . dextrose 5 % and 0.45 % NaCl with KCl 20 mEq/L 75 mL/hr at 12/26/12 2333    Principal Problem:   Acute diverticulitis Active Problems:   BRBPR (bright red blood per rectum)   Colonic diverticular abscess   HYPERTENSION   CAD   BRONCHIECTASIS   Hypothyroidism   Nocturnal hypoxemia   Diverticulitis    Time spent: > 35 minutes    THOMPSON,DANIEL MD [facility] Pager [phone]  [phone]. If 7PM-7AM, please contact night-coverage at [email or web address], password [facility] 12/28/2012, 10:13 AM  LOS: 7 days    ADDENDUM: brbpr maybe hemmorrhoidal. Will add anusol supp BID for now and follow.

## 2012-12-28 NOTE — Progress Notes (Signed)
Patient ID: Belinda Day, female   DOB: 07-Jan-1946, 67 y.o.   MRN: [id number]    Subjective: Pt feels ok today.  She started having some BRBPR this morning with a couple of BMs.  Otherwise, no pain, and tolerating her full liquids.  Objective: Vital signs in last 24 hours: Temp:  [97.9 F (36.6 C)-98.2 F (36.8 C)] 97.9 F (36.6 C) (12/04 0500) Pulse Rate:  [55-80] 55 (12/04 0500) Resp:  [16-18] 18 (12/04 0500) BP: (115-138)/(53-73) 138/73 mmHg (12/04 0500) SpO2:  [95 %-100 %] 97 % (12/04 0844) Last BM Date: 12/28/12  Intake/Output from previous day: 12/03 0701 - 12/04 0700 In: 2526.3 [P.O.:1080; I.V.:1346.3; IV Piggyback:100] Out: 2400 [Urine:2400] Intake/Output this shift: Total I/O In: 240 [P.O.:240] Out: -   PE: Abd: soft, NT, ND, +Bs Heart: regular Lungs: CTAB  Lab Results:   Recent Labs  12/26/12 0605 12/28/12 0502  WBC 4.6 6.0  HGB 13.7 13.3  HCT 39.6 39.1  PLT 286 317   BMET  Recent Labs  12/26/12 0605  NA 141  K 4.3  CL 108  CO2 23  GLUCOSE 96  BUN 6  CREATININE 0.95  CALCIUM 9.5   PT/INR No results found for this basename: LABPROT, INR,  in the last 72 hours CMP     Component Value Date/Time   NA 141 12/26/2012 0605   K 4.3 12/26/2012 0605   CL 108 12/26/2012 0605   CO2 23 12/26/2012 0605   GLUCOSE 96 12/26/2012 0605   BUN 6 12/26/2012 0605   CREATININE 0.95 12/26/2012 0605   CREATININE 1.16* 08/08/2012 1020   CALCIUM 9.5 12/26/2012 0605   PROT 6.5 12/24/2012 0520   ALBUMIN 2.7* 12/24/2012 0520   AST 20 12/24/2012 0520   ALT 10 12/24/2012 0520   ALKPHOS 65 12/24/2012 0520   BILITOT 0.4 12/24/2012 0520   GFRNONAA 60* 12/26/2012 0605   GFRAA 69* 12/26/2012 0605   Lipase     Component Value Date/Time   LIPASE 31 12/21/2012 1455       Studies/Results: Ct Abdomen Pelvis W Contrast  12/26/2012   CLINICAL DATA:  Acute diverticulitis with microperforation, left upper quadrant pain, nausea, vomiting, diarrhea, past history coronary  artery disease, hypertension  EXAM: CT ABDOMEN AND PELVIS WITH CONTRAST  TECHNIQUE: Multidetector CT imaging of the abdomen and pelvis was performed using the standard protocol following bolus administration of intravenous contrast. Sagittal and coronal MPR images reconstructed from axial data set.  CONTRAST:  Dilute oral contrast.  100 cc Omnipaque 300 IV.  COMPARISON:  None  FINDINGS: Peribronchial thickening and volume loss right lower lobe.  Calcified granulomata at both lung bases.  Pleural calcifications at right lung base adjacent to right atrium.  Tiny cyst inferior pole right kidney.  Liver, spleen, pancreas, kidneys, and adrenal glands otherwise normal appearance.  Descending and sigmoid diverticula identified with focal wall thickening of the colon at the proximal descending colon associated with pericolic infiltrative/inflammatory changes compatible with acute diverticulitis.  Tiny adjacent focal fluid collection 11 x 10 x 8 mm compatible with abscess.  No definite free intraperitoneal air identified.  Stomach and remaining bowel loops normal appearance.  Bones diffusely demineralized.  No mass, adenopathy, or free fluid.  No acute osseous findings.  IMPRESSION: Acute diverticulitis at the proximal descending colon with a tiny adjacent pericolic diverticular abscess measuring 11 x 10 x 8 mm in size.  Diverticulosis of the descending and proximal sigmoid colon.  Old granulomatous disease at both lung bases  with right lower lobe volume loss and right pleural calcification.   Electronically Signed   By: Ulyses Southward M.D.   On: 12/26/2012 14:22    Anti-infectives: Anti-infectives   Start     Dose/Rate Route Frequency Ordered Stop   12/28/12 1230  amoxicillin-clavulanate (AUGMENTIN) 875-125 MG per tablet 1 tablet     1 tablet Oral Every 12 hours 12/28/12 1227     12/21/12 2200  piperacillin-tazobactam (ZOSYN) IVPB 3.375 g  Status:  Discontinued     3.375 g 12.5 mL/hr over 240 Minutes Intravenous 3  times per day 12/21/12 1956 12/28/12 1227       Assessment/Plan  1. Diverticulitis with microperforation and small abscess, unable to be drained 2. CAD 3. HTN 4. Hypothyroidism 5. BRBPR  Plan: 1. Suspect patient had some BRBPR this morning secondary to irritation from her diverticulitis and her heparin and ASA.  Agree with dc both of these.  Suspect this will stop her bleeding.  Repeat labs have already been ordered by medicine and we will follow these. 2. Will transition to oral augmentin today and SLIV due to IV issues 3. Keep on full liquids today.  If no further bleeding, then can likely be transitioned to low fiber tomorrow and possibly home tomorrow afternoon.   LOS: 7 days    Sianni Cloninger E 12/28/2012, 12:27 PM Pager: [phone]

## 2012-12-29 LAB — CBC
HCT: 41 % (ref 36.0–46.0)
Hemoglobin: 13.6 g/dL (ref 12.0–15.0)
MCHC: 33.2 g/dL (ref 30.0–36.0)
MCV: 94 fL (ref 78.0–100.0)
RBC: 4.36 MIL/uL (ref 3.87–5.11)
WBC: 6.4 10*3/uL (ref 4.0–10.5)

## 2012-12-29 LAB — BASIC METABOLIC PANEL
BUN: 8 mg/dL (ref 6–23)
CO2: 26 mEq/L (ref 19–32)
Calcium: 9.6 mg/dL (ref 8.4–10.5)
Chloride: 104 mEq/L (ref 96–112)
Creatinine, Ser: 0.91 mg/dL (ref 0.50–1.10)
GFR calc Af Amer: 73 mL/min — ABNORMAL LOW (ref >90)
GFR calc non Af Amer: 63 mL/min — ABNORMAL LOW (ref >90)
Glucose, Bld: 99 mg/dL (ref 70–99)
Potassium: 3.9 mEq/L (ref 3.5–5.1)

## 2012-12-29 MED ORDER — ONDANSETRON HCL 4 MG PO TABS: 4.0000 mg | ORAL_TABLET | Freq: Three times a day (TID) | ORAL | Status: DC | PRN

## 2012-12-29 MED ORDER — HYDROCODONE-ACETAMINOPHEN 5-325 MG PO TABS: 0.5000 | ORAL_TABLET | Freq: Four times a day (QID) | ORAL | Status: DC | PRN

## 2012-12-29 MED ORDER — SACCHAROMYCES BOULARDII 250 MG PO CAPS: 250.0000 mg | ORAL_CAPSULE | Freq: Two times a day (BID) | ORAL | Status: DC

## 2012-12-29 MED ORDER — AMOXICILLIN-POT CLAVULANATE 875-125 MG PO TABS: 1.0000 | ORAL_TABLET | Freq: Two times a day (BID) | ORAL | Status: DC

## 2012-12-29 NOTE — Discharge Summary (Signed)
Physician Discharge Summary  Patient ID: Belinda Day MRN: [id number] DOB/AGE: [id number] y.o.  Admit date: 12/21/2012 Discharge date: 12/29/2012  Admission Diagnoses: Diverticulitis with microperforation  Discharge Diagnoses:  Acute diverticulitis/Colonic diverticular abscess  Active Problems:  HYPERTENSION  CAD  BRONCHIECTASIS  Hypothyroidism  Nocturnal hypoxemia  Diverticulitis  BRBPR (bright red blood per rectum)   Principal Problem:   Acute diverticulitis Active Problems:   HYPERTENSION   CAD   BRONCHIECTASIS   Hypothyroidism   Nocturnal hypoxemia   Diverticulitis   BRBPR (bright red blood per rectum)   Colonic diverticular abscess   PROCEDURES: none  Hospital Course: I was asked to evaluate this patient for diverticulitis. She comes into the emergency room today complaining of a three-day history of increasing abdominal pain located in the left upper quadrant. She says that this began 3 days ago of this just the mild discomfort which has steadily progressed over the last 3 days to now being pain of 8/10 severity which feels like a hot knife is twisting in her left upper quadrant with radiation to her left back. She denies any fevers or chills or nausea or vomiting or associated symptoms. She denies any history of peptic ulcer disease or reflux. She does have a history of known diverticulosis and is followed by Dr. Kinnie Scales with colonoscopies every 3 years because she has a strong family history of colon cancer. She said that she is current on her colonoscopy. She says that she is normally constipated and takes milk of magnesia for relief with her last bowel movement being 2 days ago. She denies any blood in her stools or melena. She denies any pain with urination or hematuria. She does have a history of three-vessel bypass in 2000 but is followed by Dr. Jens Som annually for this and denies any recurrent heart problems or chest pains.  She was admitted and followed by  Medicine.  She made good progress on IV antibiotics, bowel rest, and IV hydration.  She continued to have some pain but it did improve. On 12/26/12 she had her CT repeated showing: Acute diverticulitis at the proximal descending colon with a tiny  adjacent pericolic diverticular abscess measuring 11 x 10 x 8 mm in  size. Diverticulosis of the descending and proximal sigmoid colon.  She was continued on IV antibiotics and transitioned to full liquids on 12/28/12 and oral antibiotics. If she does well we hope to advance diet and send home evening of 12/29/12 with 14 days of antibiotics. Follow up by Dr. Daphine Deutscher in 2 weeks    Condition on d/c:  Improved       Disposition: Home   Future Appointments Provider Department Dept Phone   01/08/2013 8:45 AM Lewayne Bunting, MD [facility] [place] [facility] [phone]   02/12/2013 9:30 AM Sandford Craze, NP [facility] at  [facility] [place] [phone]       Medication List         acetaminophen 650 MG CR tablet  Commonly known as:  TYLENOL  every 8 (eight) hours as needed for pain. 1 - 2 tablets daily for pain.     amitriptyline 50 MG tablet  Commonly known as:  ELAVIL  Take 1 tablet (50 mg total) by mouth at bedtime.     amoxicillin-clavulanate 875-125 MG per tablet  Commonly known as:  AUGMENTIN  Take 1 tablet by mouth every 12 (twelve) hours.     aspirin 81 MG tablet  Take 81 mg by mouth daily.  atorvastatin 80 MG tablet  Commonly known as:  LIPITOR  Take 1 tablet (80 mg total) by mouth daily.     budesonide-formoterol 160-4.5 MCG/ACT inhaler  Commonly known as:  SYMBICORT  Inhale 2 puffs into the lungs 2 (two) times daily.     diphenhydrAMINE 25 MG tablet  Commonly known as:  BENADRYL  Take 40 mg by mouth at bedtime.     HYDROcodone-acetaminophen 5-325 MG per tablet  Commonly known as:  NORCO/VICODIN  Take 0.5-1 tablets by mouth every 6 (six) hours as needed for moderate pain or severe pain.     levalbuterol  45 MCG/ACT inhaler  Commonly known as:  XOPENEX HFA  Inhale 1-2 puffs into the lungs 2 (two) times daily as needed for wheezing or shortness of breath.     levothyroxine 75 MCG tablet  Commonly known as:  SYNTHROID, LEVOTHROID  Take 75 mcg by mouth daily.     multivitamin tablet  Take 1 tablet by mouth daily.     nebivolol 5 MG tablet  Commonly known as:  BYSTOLIC  Take 5 mg by mouth daily.     ondansetron 4 MG tablet  Commonly known as:  ZOFRAN  Take 1 tablet (4 mg total) by mouth every 8 (eight) hours as needed for nausea or vomiting.     OXYGEN-HELIUM IN  Inhale 2.5 L into the lungs at bedtime as needed.     saccharomyces boulardii 250 MG capsule  Commonly known as:  FLORASTOR  Take 1 capsule (250 mg total) by mouth 2 (two) times daily.     tiZANidine 4 MG tablet  Commonly known as:  ZANAFLEX  take 1 tablet by mouth at bedtime if needed     valsartan 160 MG tablet  Commonly known as:  DIOVAN  Take 160 mg by mouth daily.           Follow-up Information   Follow up with Luretha Murphy B, MD. Schedule an appointment as soon as possible for a visit in 2 weeks.   Specialty:  General Surgery   Contact information:   [address] [place] [address] [phone]       Schedule an appointment as soon as possible for a visit with Lemont Fillers., NP. (As needed)    Specialty:  Internal Medicine   Contact information:   [address] [place] [address] [phone]       Follow up with MEDOFF,JEFFREY R, MD. Schedule an appointment as soon as possible for a visit in 5 weeks.   Specialty:  Gastroenterology   [facility] information[facility] [place] [address] [phone]       Signed: Sherrie George 12/29/2012, 4:42 PM

## 2012-12-29 NOTE — Progress Notes (Signed)
Patient and dressed and ready for d/c to home. No c/o discomfort or recent blood in stool, Tolerating diet and has been anticipating going home over these shift hours. Accepted discharge instructions with prescriptions given. [facility] called and Zofran is ready for pick-up. Staff member to carry to vehicle via wheelchair. Ambulates independently ad lib.

## 2012-12-29 NOTE — Progress Notes (Signed)
[facility] CONSULT NOTE/PROGRESS NOTE  Belinda Day HQI:[id number] DOB: 08-17-1945 DOA: 12/21/2012 PCP: Lemont Fillers., NP  Narrative: -Patient is a 67 year old Caucasian female who presented with acute diverticulitis with microperforation. General surgery currently primary and plan is to continue IV antibiotics (Zosyn). Repeat CT done 12/26/2012 reported acute diverticulitis of the proximal descending colon with a tiny adjacent pericolic diverticular abscess. We have been consult at for management of medical problems at this juncture.  Assessment/Plan: Acute diverticulitis with microperforation and diverticular abscess Patient with clinical improvement. No abdominal pain. No nausea, no vomiting. Patient with bright red blood per rectum which has improved from yesterday.  -Repeat CT of abdomen/pelvis with acute diverticulitis of the proximal descending colon with tiny adjacent pericolic diverticular abscess measuring 11 x 10 x 8 mm in size. Diverticulosis of the descending and proximal sigmoid colon. -Patient tolerating low fiber diet. Patient has been transitioned to oral Augmentin. Will likely need outpatient colonoscopy in 6 weeks. Continue pain management and supportive care. -medically stable to go to surgery if patient needs intervention     Bright red blood per rectum Patient with 2 bloody bowel movements this morning. Patient denies any abdominal pain. No nausea. No vomiting. Likely secondary to diverticular bleed vs hemmorrhoiddal.  Discontinued heparin. Discontinued aspirin. Follow H&H. Bleeding improving. Continue anusol suppositories.  Coronary artery disease  -History of CABG 2002  -Stable without any symptoms  -Hold aspirin secondary to bright red blood parenchyma and this morning. -Continue nebivolol  -EKG unchanged compared to 12/2011, nonspecific changes   Hyperlipidemia  -Hold Lipitor for now  -plan to restart after d/c  - stable  Bronchiectasis with  nocturnal hypoxemia  -No hypoxemia during daytime  -Continue when necessary nighttime oxygen  -xopenex prn sob  -Clinically stable  -Continue Symbicort   Hypertension  -Continue ARB and nebivolol  -controlled currently  Hypothyroidism - Continue Synthroid.   Prophylaxis PPI for GI prophylaxis. SCDs for DVT prophylaxis.  Family Communication: Updated patient at bedside.  Disposition Plan: Once medically stable in per primary team.   Consultants:  General surgery: Lodema Pilot  Procedures:  CT of the abdomen and pelvis  12/21/2012, 12/26/2012  Antibiotics:  IV Zosyn 12/21/2012--->12/28/12  Oral Augmentin 12/28/2012  HPI/Subjective: Patient states abdominal pain has improved. No nausea. No vomiting. Patient states ambulating in the hallway without any problems. Patient states had been bowel movement this morning however significantly less blood approximately 20% of what was noted yesterday.Patient feels she is improving.   Objective: Filed Vitals:   12/29/12 0959  BP: 140/71  Pulse: 62  Temp:   Resp:     Intake/Output Summary (Last 24 hours) at 12/29/12 1352 Last data filed at 12/29/12 1200  Gross per 24 hour  Intake    240 ml  Output   1600 ml  Net  -1360 ml   Filed Weights   12/21/12 1950 12/21/12 2300  Weight: 60.102 kg (132 lb 8 oz) 60.102 kg (132 lb 8 oz)    Exam:   General:  Pt in NAD, Alert and Awake  Cardiovascular: RRR, no MRG  Respiratory: CTA BL, no wheezes  Abdomen: soft, positive bowel sounds, nondistended, nontender to palpation.  Musculoskeletal: no cyanosis or clubbing.   Data Reviewed: Basic Metabolic Panel:  Recent Labs Lab 12/23/12 0510 12/24/12 0520 12/26/12 0605 12/29/12 0418  NA 137 136 141 140  K 4.0 4.4 4.3 3.9  CL 103 104 108 104  CO2 23 19 23 26   GLUCOSE 112* 111* 96 99  BUN 9 11 6 8   CREATININE 0.78 0.90 0.95 0.91  CALCIUM 8.9 8.8 9.5 9.6   Liver Function Tests:  Recent Labs Lab 12/24/12 0520  AST 20   ALT 10  ALKPHOS 65  BILITOT 0.4  PROT 6.5  ALBUMIN 2.7*   No results found for this basename: LIPASE, AMYLASE,  in the last 168 hours No results found for this basename: AMMONIA,  in the last 168 hours CBC:  Recent Labs Lab 12/24/12 0520 12/26/12 0605 12/28/12 0502 12/28/12 1539 12/29/12 0418  WBC 5.0 4.6 6.0 6.5 6.4  HGB 12.7 13.7 13.3 14.8 13.6  HCT 37.3 39.6 39.1 44.1 41.0  MCV 93.5 92.1 92.4 93.4 94.0  PLT 262 286 317 396 359   Cardiac Enzymes: No results found for this basename: CKTOTAL, CKMB, CKMBINDEX, TROPONINI,  in the last 168 hours BNP (last 3 results) No results found for this basename: PROBNP,  in the last 8760 hours CBG: No results found for this basename: GLUCAP,  in the last 168 hours  No results found for this or any previous visit (from the past 240 hour(s)).   Studies: No results found.  Scheduled Meds: . amitriptyline  50 mg Oral QHS  . amoxicillin-clavulanate  1 tablet Oral Q12H  . atorvastatin  80 mg Oral Daily  . budesonide-formoterol  2 puff Inhalation BID  . hydrocortisone  25 mg Rectal BID  . irbesartan  150 mg Oral Daily  . levothyroxine  75 mcg Oral QAC breakfast  . nebivolol  5 mg Oral Daily  . saccharomyces boulardii  250 mg Oral BID   Continuous Infusions: . dextrose 5 % and 0.45 % NaCl with KCl 20 mEq/L Stopped (12/28/12 1757)    Principal Problem:   Acute diverticulitis Active Problems:   BRBPR (bright red blood per rectum)   Colonic diverticular abscess   HYPERTENSION   CAD   BRONCHIECTASIS   Hypothyroidism   Nocturnal hypoxemia   Diverticulitis    Time spent: > 35 minutes    THOMPSON,DANIEL MD [facility] Pager [phone]. If 7PM-7AM, please contact night-coverage at [email or web address], password [facility] 12/29/2012, 1:52 PM  LOS: 8 days

## 2012-12-29 NOTE — Progress Notes (Signed)
  Subjective: She had a little pain left side early this Am and another stool with some blood, but much less.  Objective: Vital signs in last 24 hours: Temp:  [97.6 F (36.4 C)-98.3 F (36.8 C)] 97.6 F (36.4 C) (12/05 0604) Pulse Rate:  [56-66] 57 (12/05 0604) Resp:  [16-18] 16 (12/04 2112) BP: (112-142)/(56-64) 112/57 mmHg (12/05 0604) SpO2:  [97 %-98 %] 98 % (12/05 0604) Last BM Date: 12/28/12 1080 PO yesterday 2 BM's Afebrile, VSS Labs OK, H/H stable Intake/Output from previous day: 12/04 0701 - 12/05 0700 In: 900 [P.O.:600; I.V.:300] Out: 1800 [Urine:1800] Intake/Output this shift:    General appearance: alert, cooperative and no distress GI: soft, non-tender; bowel sounds normal; no masses,  no organomegaly  Lab Results:   Recent Labs  12/28/12 1539 12/29/12 0418  WBC 6.5 6.4  HGB 14.8 13.6  HCT 44.1 41.0  PLT 396 359    BMET  Recent Labs  12/29/12 0418  NA 140  K 3.9  CL 104  CO2 26  GLUCOSE 99  BUN 8  CREATININE 0.91  CALCIUM 9.6   PT/INR No results found for this basename: LABPROT, INR,  in the last 72 hours   Recent Labs Lab 12/24/12 0520  AST 20  ALT 10  ALKPHOS 65  BILITOT 0.4  PROT 6.5  ALBUMIN 2.7*     Lipase     Component Value Date/Time   LIPASE 31 12/21/2012 1455     Studies/Results: No results found.  Medications: . amitriptyline  50 mg Oral QHS  . amoxicillin-clavulanate  1 tablet Oral Q12H  . atorvastatin  80 mg Oral Daily  . budesonide-formoterol  2 puff Inhalation BID  . hydrocortisone  25 mg Rectal BID  . irbesartan  150 mg Oral Daily  . levothyroxine  75 mcg Oral QAC breakfast  . nebivolol  5 mg Oral Daily  . saccharomyces boulardii  250 mg Oral BID    Assessment/Plan Acute diverticulitis/Colonic diverticular abscess  Active Problems:  HYPERTENSION  CAD  BRONCHIECTASIS  Hypothyroidism  Nocturnal hypoxemia  Diverticulitis  BRBPR (bright red blood per rectum)    PLan:  Low fiber diet, continue  PO antibiotics for 2 weeks and if she does well home later today or tomorrow.   LOS: 8 days    Belinda Day 12/29/2012

## 2013-01-08 ENCOUNTER — Encounter: Payer: Self-pay | Admitting: Cardiology

## 2013-01-08 ENCOUNTER — Encounter: Payer: Self-pay | Admitting: *Deleted

## 2013-01-08 ENCOUNTER — Ambulatory Visit (INDEPENDENT_AMBULATORY_CARE_PROVIDER_SITE_OTHER): Payer: Medicare Other | Admitting: Cardiology

## 2013-01-08 VITALS — BP 120/60 | HR 60 | Ht 62.0 in | Wt 128.8 lb

## 2013-01-08 DIAGNOSIS — E785 Hyperlipidemia, unspecified: Secondary | ICD-10-CM

## 2013-01-08 DIAGNOSIS — I10 Essential (primary) hypertension: Secondary | ICD-10-CM

## 2013-01-08 DIAGNOSIS — I2510 Atherosclerotic heart disease of native coronary artery without angina pectoris: Secondary | ICD-10-CM

## 2013-01-08 NOTE — Patient Instructions (Signed)
Your physician wants you to follow-up in: ONE YEAR WITH DR CRENSHAW You will receive a reminder letter in the mail two months in advance. If you don't receive a letter, please call our office to schedule the follow-up appointment.   Your physician has requested that you have en exercise stress myoview. For further information please visit [email or web address]. Please follow instruction sheet, as given.   

## 2013-01-08 NOTE — Assessment & Plan Note (Signed)
Continue statin. Lipids and liver monitored by primary care. 

## 2013-01-08 NOTE — Progress Notes (Signed)
HPI: FU CAD; history of coronary artery disease status post coronary artery bypass graft in 2002. Her last Myoview in Dec 2010, showed no scar or ischemia, and her ejection fraction was 77%. Carotid Dopplers performed in January of 2012 showed 0-39% stenosis bilaterally. I last saw her in December of 2013. Since then the patient denies any orthopnea, PND, pedal edema, palpitations, syncope or chest pain. She does have dyspnea on exertion from her lung disease. Recent discharge following admission for diverticulitis with micro-perforation.   Current Outpatient Prescriptions  Medication Sig Dispense Refill  . acetaminophen (TYLENOL) 650 MG CR tablet every 8 (eight) hours as needed for pain. 1 - 2 tablets daily for pain.      Marland Kitchen amitriptyline (ELAVIL) 50 MG tablet Take 1 tablet (50 mg total) by mouth at bedtime.  90 tablet  1  . amoxicillin-clavulanate (AUGMENTIN) 875-125 MG per tablet Take 1 tablet by mouth every 12 (twelve) hours.  28 tablet  0  . aspirin 81 MG tablet Take 81 mg by mouth daily.       Marland Kitchen atorvastatin (LIPITOR) 80 MG tablet Take 1 tablet (80 mg total) by mouth daily.  90 tablet  3  . budesonide-formoterol (SYMBICORT) 160-4.5 MCG/ACT inhaler Inhale 2 puffs into the lungs at bedtime.       . diphenhydrAMINE (BENADRYL) 25 MG tablet Take 40 mg by mouth at bedtime.       Marland Kitchen HYDROcodone-acetaminophen (NORCO/VICODIN) 5-325 MG per tablet Take 0.5-1 tablets by mouth every 6 (six) hours as needed for moderate pain or severe pain.  40 tablet  0  . levalbuterol (XOPENEX HFA) 45 MCG/ACT inhaler Inhale 1-2 puffs into the lungs 2 (two) times daily as needed for wheezing or shortness of breath.       . levothyroxine (SYNTHROID, LEVOTHROID) 75 MCG tablet Take 75 mcg by mouth daily.      . Multiple Vitamin (MULTIVITAMIN) tablet Take 1 tablet by mouth daily.       . nebivolol (BYSTOLIC) 5 MG tablet Take 5 mg by mouth daily.      . ondansetron (ZOFRAN) 4 MG tablet Take 1 tablet (4 mg total) by  mouth every 8 (eight) hours as needed for nausea or vomiting.  30 tablet  0  . OXYGEN-HELIUM IN Inhale 2.5 L into the lungs at bedtime as needed.       . saccharomyces boulardii (FLORASTOR) 250 MG capsule Take 1 capsule (250 mg total) by mouth 2 (two) times daily.  60 capsule  0  . tiZANidine (ZANAFLEX) 4 MG tablet take 1 tablet by mouth at bedtime if needed      . valsartan (DIOVAN) 160 MG tablet Take 160 mg by mouth daily.       No current facility-administered medications for this visit.     Past Medical History  Diagnosis Date  . Bronchiectasis     oxygen at night   . MAI (mycobacterium avium-intracellulare)   . CAD (coronary artery disease)   . Hyperlipidemia   . HTN (hypertension)   . GERD (gastroesophageal reflux disease)   . History of shingles 04/2012  . Hypothyroidism   . Neuritis of upper extremity     Past Surgical History  Procedure Laterality Date  . Coronary artery bypass graft  2002    History   Social History  . Marital Status: Single    Spouse Name: N/A    Number of Children: N/A  . Years of Education: N/A  Occupational History  . Not on file.   Social History Main Topics  . Smoking status: Former Smoker -- 1.00 packs/day for 20 years    Types: Cigarettes    Quit date: 01/25/1970  . Smokeless tobacco: Never Used  . Alcohol Use: No  . Drug Use: No  . Sexual Activity: Not on file   Other Topics Concern  . Not on file   Social History Narrative  . No narrative on file    ROS: no fevers or chills, productive cough, hemoptysis, dysphasia, odynophagia, melena, hematochezia, dysuria, hematuria, rash, seizure activity, orthopnea, PND, pedal edema, claudication. Remaining systems are negative.  Physical Exam: Well-developed well-nourished in no acute distress.  Skin is warm and dry.  HEENT is normal.  Neck is supple.  Chest is clear to auscultation with normal expansion.  Cardiovascular exam is regular rate and rhythm.  Abdominal exam  nontender or distended. No masses palpated. Extremities show no edema. neuro grossly intact  ECG 12/30/2012- sinus rhythm with nonspecific ST changes.

## 2013-01-08 NOTE — Assessment & Plan Note (Signed)
Blood pressure controlled. Continue present medications. 

## 2013-01-08 NOTE — Assessment & Plan Note (Signed)
Continue aspirin and statin. Schedule Myoview for risk stratification. She may require abdominal surgery and would need risk stratification preoperatively.

## 2013-01-09 ENCOUNTER — Other Ambulatory Visit: Payer: Self-pay | Admitting: Internal Medicine

## 2013-01-12 ENCOUNTER — Ambulatory Visit (INDEPENDENT_AMBULATORY_CARE_PROVIDER_SITE_OTHER): Payer: Medicare Other | Admitting: Surgery

## 2013-01-12 ENCOUNTER — Encounter (INDEPENDENT_AMBULATORY_CARE_PROVIDER_SITE_OTHER): Payer: Self-pay | Admitting: Surgery

## 2013-01-12 VITALS — BP 122/70 | HR 60 | Temp 97.8°F | Resp 18 | Ht 62.0 in | Wt 130.0 lb

## 2013-01-12 DIAGNOSIS — K5732 Diverticulitis of large intestine without perforation or abscess without bleeding: Secondary | ICD-10-CM

## 2013-01-12 DIAGNOSIS — K5792 Diverticulitis of intestine, part unspecified, without perforation or abscess without bleeding: Secondary | ICD-10-CM

## 2013-01-12 NOTE — Patient Instructions (Signed)
Diverticulitis °A diverticulum is a small pouch or sac on the colon. Diverticulosis is the presence of these diverticula on the colon. Diverticulitis is the irritation (inflammation) or infection of diverticula. °CAUSES  °The colon and its diverticula contain bacteria. If food particles block the tiny opening to a diverticulum, the bacteria inside can grow and cause an increase in pressure. This leads to infection and inflammation and is called diverticulitis. °SYMPTOMS  °· Abdominal pain and tenderness. Usually, the pain is located on the left side of your abdomen. However, it could be located elsewhere. °· Fever. °· Bloating. °· Feeling sick to your stomach (nausea). °· Throwing up (vomiting). °· Abnormal stools. °DIAGNOSIS  °Your caregiver will take a history and perform a physical exam. Since many things can cause abdominal pain, other tests may be necessary. Tests may include: °· Blood tests. °· Urine tests. °· X-ray of the abdomen. °· CT scan of the abdomen. °Sometimes, surgery is needed to determine if diverticulitis or other conditions are causing your symptoms. °TREATMENT  °Most of the time, you can be treated without surgery. Treatment includes: °· Resting the bowels by only having liquids for a few days. As you improve, you will need to eat a low-fiber diet. °· Intravenous (IV) fluids if you are losing body fluids (dehydrated). °· Antibiotic medicines that treat infections may be given. °· Pain and nausea medicine, if needed. °· Surgery if the inflamed diverticulum has burst. °HOME CARE INSTRUCTIONS  °· Try a clear liquid diet (broth, tea, or water for as long as directed by your caregiver). You may then gradually begin a low-fiber diet as tolerated.  °A low-fiber diet is a diet with less than 10 grams of fiber. Choose the foods below to reduce fiber in the diet: °· White breads, cereals, rice, and pasta. °· Cooked fruits and vegetables or soft fresh fruits and vegetables without the skin. °· Ground or  well-cooked tender beef, ham, veal, lamb, pork, or poultry. °· Eggs and seafood. °· After your diverticulitis symptoms have improved, your caregiver may put you on a high-fiber diet. A high-fiber diet includes 14 grams of fiber for every 1000 calories consumed. For a standard 2000 calorie diet, you would need 28 grams of fiber. Follow these diet guidelines to help you increase the fiber in your diet. It is important to slowly increase the amount fiber in your diet to avoid gas, constipation, and bloating. °· Choose whole-grain breads, cereals, pasta, and brown rice. °· Choose fresh fruits and vegetables with the skin on. Do not overcook vegetables because the more vegetables are cooked, the more fiber is lost. °· Choose more nuts, seeds, legumes, dried peas, beans, and lentils. °· Look for food products that have greater than 3 grams of fiber per serving on the Nutrition Facts label. °· Take all medicine as directed by your caregiver. °· If your caregiver has given you a follow-up appointment, it is very important that you go. Not going could result in lasting (chronic) or permanent injury, pain, and disability. If there is any problem keeping the appointment, call to reschedule. °SEEK MEDICAL CARE IF:  °· Your pain does not improve. °· You have a hard time advancing your diet beyond clear liquids. °· Your bowel movements do not return to normal. °SEEK IMMEDIATE MEDICAL CARE IF:  °· Your pain becomes worse. °· You have an oral temperature above 102° F (38.9° C), not controlled by medicine. °· You have repeated vomiting. °· You have bloody or black, tarry stools. °·   Symptoms that brought you to your caregiver become worse or are not getting better. °MAKE SURE YOU:  °· Understand these instructions. °· Will watch your condition. °· Will get help right away if you are not doing well or get worse. °Document Released: 10/21/2004 Document Revised: 04/05/2011 Document Reviewed: 02/16/2010 °[employer]® Patient Information  ©2014 [employer]. ° °

## 2013-01-12 NOTE — Progress Notes (Signed)
Belinda Day 67 y.o.  Body mass index is 23.77 kg/(m^2).  Patient Active Problem List   Diagnosis Date Noted  . BRBPR (bright red blood per rectum) 12/28/2012  . Colonic diverticular abscess 12/28/2012  . Acute diverticulitis 12/23/2012  . Diverticulitis 12/21/2012  . Nocturnal hypoxemia 12/12/2012  . Fibromyalgia 12/28/2011  . Osteopenia 08/14/2011  . Hypothyroidism 04/06/2010  . MAI PULMONARY DISEASES DUE TO MYCOBACTERIA 11/19/2009  . HYPERLIPIDEMIA 07/03/2007  . HYPERTENSION 07/03/2007  . CAD 07/03/2007  . BRONCHIECTASIS 07/03/2007  . CEREBROVASCULAR DISEASE, HX OF 07/03/2007  . GASTROESOPHAGEAL REFLUX DISEASE, HX OF 07/03/2007    Allergies  Allergen Reactions  . Ciprofloxacin Other (See Comments)    Body aches  . Codeine Nausea Only    REACTION: nausea  . Erythromycin Nausea Only    REACTION: nausea  . Levofloxacin Other (See Comments)    REACTION: aches    Past Surgical History  Procedure Laterality Date  . Coronary artery bypass graft  2002   Letitia Libra, Ala Dach, MD No diagnosis found.  Doing very well.  Antibiotics completed.  Has dull pain in left side that comes and goes.  This is same pain as she has had before.  Probably chronic recurrent.   Will see Ritta Slot in mid January regarding colonoscopy.  I will see her again in Feb.  If doing well I would see again PRN.  Hopefully can avoid surgery.   Matt B. Daphine Deutscher, MD, [facility] [phone] beeper [phone]  01/12/2013 4:40 PM

## 2013-01-26 LAB — HM COLONOSCOPY: HM COLON: NORMAL

## 2013-01-29 ENCOUNTER — Ambulatory Visit (HOSPITAL_COMMUNITY): Payer: Medicare Other | Attending: Cardiovascular Disease | Admitting: Radiology

## 2013-01-29 ENCOUNTER — Encounter: Payer: Self-pay | Admitting: Cardiovascular Disease

## 2013-01-29 VITALS — BP 126/68 | Ht 62.0 in | Wt 135.0 lb

## 2013-01-29 DIAGNOSIS — R0602 Shortness of breath: Secondary | ICD-10-CM | POA: Insufficient documentation

## 2013-01-29 DIAGNOSIS — R0989 Other specified symptoms and signs involving the circulatory and respiratory systems: Secondary | ICD-10-CM | POA: Insufficient documentation

## 2013-01-29 DIAGNOSIS — R5381 Other malaise: Secondary | ICD-10-CM | POA: Insufficient documentation

## 2013-01-29 DIAGNOSIS — I2510 Atherosclerotic heart disease of native coronary artery without angina pectoris: Secondary | ICD-10-CM

## 2013-01-29 DIAGNOSIS — R0609 Other forms of dyspnea: Secondary | ICD-10-CM | POA: Insufficient documentation

## 2013-01-29 DIAGNOSIS — I10 Essential (primary) hypertension: Secondary | ICD-10-CM | POA: Insufficient documentation

## 2013-01-29 DIAGNOSIS — Z951 Presence of aortocoronary bypass graft: Secondary | ICD-10-CM | POA: Insufficient documentation

## 2013-01-29 DIAGNOSIS — E785 Hyperlipidemia, unspecified: Secondary | ICD-10-CM

## 2013-01-29 DIAGNOSIS — I779 Disorder of arteries and arterioles, unspecified: Secondary | ICD-10-CM | POA: Insufficient documentation

## 2013-01-29 DIAGNOSIS — Z87891 Personal history of nicotine dependence: Secondary | ICD-10-CM | POA: Insufficient documentation

## 2013-01-29 DIAGNOSIS — R5383 Other fatigue: Secondary | ICD-10-CM

## 2013-01-29 MED ORDER — TECHNETIUM TC 99M SESTAMIBI GENERIC - CARDIOLITE
30.0000 | Freq: Once | INTRAVENOUS | Status: AC | PRN
  Administered 2013-01-29: 30 via INTRAVENOUS

## 2013-01-29 MED ORDER — TECHNETIUM TC 99M SESTAMIBI GENERIC - CARDIOLITE
10.0000 | Freq: Once | INTRAVENOUS | Status: AC | PRN
  Administered 2013-01-29: 10 via INTRAVENOUS

## 2013-01-29 NOTE — Progress Notes (Signed)
  [facility] [address] [place], [place] [address] [phone]    Cardiology Nuclear Med Study  Belinda Day is a 68 y.o. female     MRN : [id number]     DOB: 1945-04-10  Procedure Date: 01/29/2013  Nuclear Med Background Indication for Stress Test:  Evaluation for Ischemia and Graft Patency History:  Asthma and 2002 CABG  MPI: 2010 NL EF: 77% Cardiac Risk Factors: Carotid Disease, Family History - CAD, History of Smoking and Hypertension  Symptoms:  DOE, Fatigue and SOB   Nuclear Pre-Procedure Caffeine/Decaff Intake:  None NPO After: 8:30pm   Lungs:  clear O2 Sat: 98% on room air. IV 0.9% NS with Angio Cath:  22g  IV Site: R Hand  IV Started by:  Matilde Haymaker, RN  Chest Size (in):  36 Cup Size: D  Height: 5\' 2"  (1.575 m)  Weight:  135 lb (61.236 kg)  BMI:  Body mass index is 24.69 kg/(m^2). Tech Comments:  Patient took Bystolic this am as instructed by Dr.    Sigurd Sos Med Study 1 or 2 day study: 1 day  Stress Test Type:  Stress  Reading MD: n/a  Order Authorizing Provider:  Queen Blossom  Resting Radionuclide: Technetium 55m Sestamibi  Resting Radionuclide Dose: 11.0 mCi   Stress Radionuclide:  Technetium 85m Sestamibi  Stress Radionuclide Dose: 33.0 mCi           Stress Protocol Rest HR: 67 Stress HR: 128  Rest BP: 126/68 Stress BP: 174/86  Exercise Time (min): 7:47 METS: 7.50   Predicted Max HR: 151 bpm % Max HR: 84.77 bpm Rate Pressure Product: 22272   Dose of Adenosine (mg):  n/a Dose of Lexiscan: n/a mg  Dose of Atropine (mg): n/a Dose of Dobutamine: n/a mcg/kg/min (at max HR)  Stress Test Technologist: Perrin Maltese, EMT-P  Nuclear Technologist:  Charlton Amor, CNMT     Rest Procedure:  Myocardial perfusion imaging was performed at rest 45 minutes following the intravenous administration of Technetium 5m Sestamibi. Rest ECG: NSR poor R wave progression nonspecific ST/T wave changes  Stress Procedure:   The patient exercised on the treadmill utilizing the Bruce Protocol for 7:47 minutes. The patient stopped due to sob and denied any chest pain.  Technetium 40m Sestamibi was injected at peak exercise and myocardial perfusion imaging was performed after a brief delay. Stress ECG: No significant change from baseline ECG  QPS Raw Data Images:  Normal; no motion artifact; normal heart/lung ratio. Stress Images:  Normal homogeneous uptake in all areas of the myocardium. Rest Images:  Normal homogeneous uptake in all areas of the myocardium. Subtraction (SDS):  Normal Transient Ischemic Dilatation (Normal <1.22):  0.89 Lung/Heart Ratio (Normal <0.45):  0.36  Quantitative Gated Spect Images QGS EDV:  47 ml QGS ESV:  5 ml  Impression Exercise Capacity:  Fair exercise capacity. BP Response:  Normal blood pressure response. Clinical Symptoms:  There is dyspnea. ECG Impression:  No significant ST segment change suggestive of ischemia. Comparison with Prior Nuclear Study: No images to compare  Overall Impression:  Normal stress nuclear study.  LV Ejection Fraction: 89%.  LV Wall Motion:  NL LV Function; NL Wall Motion   [name]

## 2013-02-06 ENCOUNTER — Ambulatory Visit: Payer: Medicare Other | Admitting: Internal Medicine

## 2013-02-12 ENCOUNTER — Encounter: Payer: Self-pay | Admitting: Family

## 2013-02-12 ENCOUNTER — Telehealth: Payer: Self-pay | Admitting: *Deleted

## 2013-02-12 ENCOUNTER — Ambulatory Visit (INDEPENDENT_AMBULATORY_CARE_PROVIDER_SITE_OTHER): Payer: Medicare Other | Admitting: Family

## 2013-02-12 VITALS — BP 130/81 | HR 64 | Temp 98.5°F | Resp 16 | Ht 62.0 in | Wt 137.0 lb

## 2013-02-12 DIAGNOSIS — I10 Essential (primary) hypertension: Secondary | ICD-10-CM

## 2013-02-12 DIAGNOSIS — M797 Fibromyalgia: Secondary | ICD-10-CM

## 2013-02-12 DIAGNOSIS — K5732 Diverticulitis of large intestine without perforation or abscess without bleeding: Secondary | ICD-10-CM

## 2013-02-12 DIAGNOSIS — R739 Hyperglycemia, unspecified: Secondary | ICD-10-CM

## 2013-02-12 DIAGNOSIS — E039 Hypothyroidism, unspecified: Secondary | ICD-10-CM

## 2013-02-12 DIAGNOSIS — IMO0001 Reserved for inherently not codable concepts without codable children: Secondary | ICD-10-CM

## 2013-02-12 DIAGNOSIS — J479 Bronchiectasis, uncomplicated: Secondary | ICD-10-CM

## 2013-02-12 DIAGNOSIS — I2510 Atherosclerotic heart disease of native coronary artery without angina pectoris: Secondary | ICD-10-CM

## 2013-02-12 DIAGNOSIS — E785 Hyperlipidemia, unspecified: Secondary | ICD-10-CM

## 2013-02-12 DIAGNOSIS — G4700 Insomnia, unspecified: Secondary | ICD-10-CM

## 2013-02-12 DIAGNOSIS — K5792 Diverticulitis of intestine, part unspecified, without perforation or abscess without bleeding: Secondary | ICD-10-CM

## 2013-02-12 DIAGNOSIS — R7309 Other abnormal glucose: Secondary | ICD-10-CM

## 2013-02-12 LAB — HEMOGLOBIN A1C
HEMOGLOBIN A1C: 6.1 % — AB (ref <5.7)
Mean Plasma Glucose: 128 mg/dL — ABNORMAL HIGH (ref <117)

## 2013-02-12 LAB — TSH: TSH: 1.187 u[IU]/mL (ref 0.350–4.500)

## 2013-02-12 MED ORDER — ALPRAZOLAM 0.25 MG PO TABS: ORAL_TABLET | ORAL | Status: DC

## 2013-02-12 MED ORDER — LEVOTHYROXINE SODIUM 75 MCG PO TABS: 75.0000 ug | ORAL_TABLET | Freq: Every day | ORAL | Status: DC

## 2013-02-12 NOTE — Assessment & Plan Note (Signed)
Clinically stable despite decrease in elavil from 75mg  to 50mg .

## 2013-02-12 NOTE — Assessment & Plan Note (Signed)
Lipids at goal, continue statin.

## 2013-02-12 NOTE — Assessment & Plan Note (Signed)
BP stable on current meds.   

## 2013-02-12 NOTE — Assessment & Plan Note (Signed)
Clinically stable on synthroid, obtain TSH, continue same.

## 2013-02-12 NOTE — Assessment & Plan Note (Signed)
Clinically stable, managed by Dr. Stanford Breed.

## 2013-02-12 NOTE — Patient Instructions (Signed)
Please complete your lab work prior to leaving. Start xanax at bedtime to help with sleep. Follow up in 6 months, sooner if problems/concerns.

## 2013-02-12 NOTE — Assessment & Plan Note (Signed)
Clinically stable, continue symbicort/xopenex, managed by Pulmonology- Dr. Melvyn Novas

## 2013-02-12 NOTE — Assessment & Plan Note (Signed)
Resolved. Has follow up with Dr. Earlean Shawl.

## 2013-02-12 NOTE — Progress Notes (Signed)
Pre visit review using our clinic review tool, if applicable. No additional management support is needed unless otherwise documented below in the visit note. 

## 2013-02-12 NOTE — Progress Notes (Signed)
Subjective:    Patient ID: Belinda Day, female    DOB: June 27, 1945, 68 y.o.   MRN: [id number]  HPI  Belinda Day is a 68 yr old female who presents today for follow up. Since her last visit with [place], she was admitted just after thanksgiving with acute diverticulitis/microperforation. She was treated with iv antibiotics.   1) Hyperlipidemia- current dose of atorvastatin is 80mg . Last lipid panel 7/14 noted LDL at goal.  LFT's in November normal.  Lab Results  Component Value Date   CHOL 154 08/08/2012   HDL 64 08/08/2012   LDLCALC 74 08/08/2012   TRIG 80 08/08/2012   CHOLHDL 2.4 08/08/2012    2) HTN-Current BP meds include diovan, and bystolic.  bmet in November normal except for mild hyperglycemia.   BP Readings from Last 3 Encounters:  02/12/13 130/81  01/29/13 126/68  01/12/13 122/70   3) Hypothyroid- currently maintained on levothyroxine 64mcg.  Lab Results  Component Value Date   TSH 1.788 08/08/2012   4) Fibromyalgia-  Last visit elavil was decreased from 75mg  to 50mg .    5) Bronchiectasis-  Currently maintained on symbicort and xopenex.  6) insomnia- used to use soma.  Tizantidine did not help.    Review of Systems See HPI  Past Medical History  Diagnosis Date  . Bronchiectasis     oxygen at night   . MAI (mycobacterium avium-intracellulare)   . CAD (coronary artery disease)   . Hyperlipidemia   . HTN (hypertension)   . GERD (gastroesophageal reflux disease)   . History of shingles 04/2012  . Hypothyroidism   . Neuritis of upper extremity     History   Social History  . Marital Status: Single    Spouse Name: N/A    Number of Children: N/A  . Years of Education: N/A   Occupational History  . Not on file.   Social History Main Topics  . Smoking status: Former Smoker -- 1.00 packs/day for 20 years    Types: Cigarettes    Quit date: 01/25/1970  . Smokeless tobacco: Never Used  . Alcohol Use: No  . Drug Use: No  . Sexual Activity: Not on file    Other Topics Concern  . Not on file   Social History Narrative  . No narrative on file    Past Surgical History  Procedure Laterality Date  . Coronary artery bypass graft  2002    Family History  Problem Relation Age of Onset  . Cancer Mother     colon  . Hyperlipidemia Mother   . Hypertension Mother   . Heart disease Mother   . Stroke Mother   . Diabetes Mother   . Cancer Father     colon  . Arthritis Father   . Hyperlipidemia Father   . Hypertension Father   . Heart disease Father   . Stroke Father   . Atopy Neg Hx     Allergies  Allergen Reactions  . Ciprofloxacin Other (See Comments)    Body aches  . Codeine Nausea Only    REACTION: nausea  . Erythromycin Nausea Only    REACTION: nausea  . Levofloxacin Other (See Comments)    REACTION: aches    Current Outpatient Prescriptions on File Prior to Visit  Medication Sig Dispense Refill  . acetaminophen (TYLENOL) 650 MG CR tablet every 8 (eight) hours as needed for pain. 1 - 2 tablets daily for pain.      Marland Kitchen amitriptyline (ELAVIL) 50  MG tablet Take 1 tablet (50 mg total) by mouth at bedtime.  90 tablet  1  . aspirin 81 MG tablet Take 81 mg by mouth daily.       Marland Kitchen atorvastatin (LIPITOR) 80 MG tablet Take 1 tablet (80 mg total) by mouth daily.  90 tablet  3  . diphenhydrAMINE (BENADRYL) 25 MG tablet Take 50 mg by mouth at bedtime.       . levalbuterol (XOPENEX HFA) 45 MCG/ACT inhaler Inhale 1-2 puffs into the lungs 2 (two) times daily as needed for wheezing or shortness of breath.       . levothyroxine (SYNTHROID, LEVOTHROID) 75 MCG tablet Take 75 mcg by mouth daily.      . Multiple Vitamin (MULTIVITAMIN) tablet Take 1 tablet by mouth daily.       . nebivolol (BYSTOLIC) 5 MG tablet Take 5 mg by mouth daily.      . OXYGEN-HELIUM IN Inhale 2.5 L into the lungs at bedtime as needed.       . SYMBICORT 160-4.5 MCG/ACT inhaler inhale 2 puffs by mouth twice a day  10.2 g  11  . valsartan (DIOVAN) 160 MG tablet Take 160  mg by mouth daily.       No current facility-administered medications on file prior to visit.    BP 130/81  Pulse 64  Temp(Src) 98.5 F (36.9 C) (Oral)  Resp 16  Ht 5\' 2"  (1.575 m)  Wt 137 lb (62.143 kg)  BMI 25.05 kg/m2  SpO2 97%       Objective:   Physical Exam  Constitutional: She is oriented to person, place, and time. She appears well-developed and well-nourished. No distress.  Cardiovascular: Normal rate and regular rhythm.   No murmur heard. Pulmonary/Chest: Effort normal and breath sounds normal. No respiratory distress. She has no wheezes. She exhibits no tenderness.  Few crackles right base  Musculoskeletal: She exhibits no edema.  Neurological: She is alert and oriented to person, place, and time.  Psychiatric: She has a normal mood and affect. Her behavior is normal. Judgment and thought content normal.          Assessment & Plan:

## 2013-02-12 NOTE — Telephone Encounter (Signed)
Pt left message on voicemail that she picked up her temporary supply of levothyroxine until her results are back but she requests brand (Synthroid) when rx is refilled.  Please advise.

## 2013-02-12 NOTE — Assessment & Plan Note (Signed)
Uncontrolled.  Trial of low dose xanax HS.

## 2013-02-13 ENCOUNTER — Telehealth: Payer: Self-pay | Admitting: Family

## 2013-02-13 MED ORDER — SYNTHROID 75 MCG PO TABS: 75.0000 ug | ORAL_TABLET | Freq: Every day | ORAL | Status: DC

## 2013-02-13 NOTE — Telephone Encounter (Signed)
Relevant patient education assigned to patient using Emmi. ° °

## 2013-02-13 NOTE — Telephone Encounter (Signed)
a1C (diabetes test) mildly elevated.  In Borderline diabetes category. It could be falsely elevated due to her recent illness. I recommend that she avoid concentrated sweets and get regular exercise. Plan follow up A1C in 3 months. TSH normal, brand rx sent to [facility] for 90 days.

## 2013-02-20 NOTE — Telephone Encounter (Signed)
Pt notified lab order entered.

## 2013-02-20 NOTE — Telephone Encounter (Signed)
Left message for pt to return my call.

## 2013-03-14 ENCOUNTER — Telehealth: Payer: Self-pay | Admitting: Family

## 2013-03-14 NOTE — Telephone Encounter (Signed)
Alprazolam 0.25 mg tab take 1 tablet by mouth at bedtime as needed for sleep qty 30

## 2013-03-14 NOTE — Telephone Encounter (Signed)
Please advise refill?  Last RX was done on 02-12-13 quantity 30 with 0 refills  If ok fax to [phone]

## 2013-03-15 MED ORDER — ALPRAZOLAM 0.25 MG PO TABS: ORAL_TABLET | ORAL | Status: DC

## 2013-03-15 NOTE — Telephone Encounter (Signed)
Ok to send 30 tabs with zero refills.  

## 2013-03-15 NOTE — Telephone Encounter (Signed)
Called rx into [facility] spoke with diane gave her Mellissa response. Updated EPIC...Johny Chess

## 2013-04-10 ENCOUNTER — Encounter (INDEPENDENT_AMBULATORY_CARE_PROVIDER_SITE_OTHER): Payer: Self-pay

## 2013-04-12 ENCOUNTER — Encounter (INDEPENDENT_AMBULATORY_CARE_PROVIDER_SITE_OTHER): Payer: Self-pay | Admitting: Surgery

## 2013-04-12 ENCOUNTER — Ambulatory Visit (INDEPENDENT_AMBULATORY_CARE_PROVIDER_SITE_OTHER): Payer: Medicare Other | Admitting: Surgery

## 2013-04-12 VITALS — BP 126/80 | HR 76 | Temp 98.2°F | Resp 16 | Ht 62.0 in | Wt 138.0 lb

## 2013-04-12 DIAGNOSIS — K5730 Diverticulosis of large intestine without perforation or abscess without bleeding: Secondary | ICD-10-CM

## 2013-04-12 NOTE — Progress Notes (Signed)
Belinda Day 68 y.o.  Body mass index is 25.23 kg/(m^2).  Patient Active Problem List   Diagnosis Date Noted  . Insomnia 02/12/2013  . Colonic diverticular abscess 12/28/2012  . Diverticulitis 12/21/2012  . Nocturnal hypoxemia 12/12/2012  . Fibromyalgia 12/28/2011  . Osteopenia 08/14/2011  . Hypothyroidism 04/06/2010  . MAI PULMONARY DISEASES DUE TO MYCOBACTERIA 11/19/2009  . HYPERLIPIDEMIA 07/03/2007  . HYPERTENSION 07/03/2007  . CAD 07/03/2007  . BRONCHIECTASIS 07/03/2007  . CEREBROVASCULAR DISEASE, HX OF 07/03/2007  . GASTROESOPHAGEAL REFLUX DISEASE, HX OF 07/03/2007    Allergies  Allergen Reactions  . Ciprofloxacin Other (See Comments)    Body aches  . Codeine Nausea Only    REACTION: nausea  . Erythromycin Nausea Only    REACTION: nausea  . Levofloxacin Other (See Comments)    REACTION: aches    Past Surgical History  Procedure Laterality Date  . Coronary artery bypass graft  2002   O'SULLIVAN,MELISSA S., NP No diagnosis found.  Had recent colonoscopy by Dr. Earlean Shawl which did not show any polyps, cancer, or strictures. She had some internal hemorrhoids which he may band at some point. She did not have any complications following her colonoscopy.  We talked about going forward and the avoiding constipation.  She seems to be well versed in this. I told her I would be happy to see her on an as-needed basis. She will followup with Dr. Earlean Shawl for her screening colonoscopy since she has a strong family history of colon cancer. Return when necessary Matt B. Hassell Done, MD, [facility] [phone] beeper [phone]  04/12/2013 10:55 AM

## 2013-04-16 ENCOUNTER — Telehealth: Payer: Self-pay | Admitting: *Deleted

## 2013-04-16 MED ORDER — ALPRAZOLAM 0.25 MG PO TABS: ORAL_TABLET | ORAL | Status: DC

## 2013-04-16 NOTE — Telephone Encounter (Signed)
Received fax requesting refill of alprazolam. Last rx provided 03/15/13, #30.  Rx called to pharmacy voicemail. #30 x no refills.

## 2013-04-17 ENCOUNTER — Other Ambulatory Visit: Payer: Self-pay | Admitting: Dermatology

## 2013-04-17 NOTE — Telephone Encounter (Signed)
Ok to send 30 tabs zero refills. 

## 2013-04-18 ENCOUNTER — Encounter: Payer: Self-pay | Admitting: Physician Assistant

## 2013-04-18 ENCOUNTER — Ambulatory Visit (INDEPENDENT_AMBULATORY_CARE_PROVIDER_SITE_OTHER): Payer: Medicare Other | Admitting: Physician Assistant

## 2013-04-18 VITALS — BP 124/74 | HR 83 | Temp 98.3°F | Ht 62.0 in | Wt 135.5 lb

## 2013-04-18 DIAGNOSIS — H8110 Benign paroxysmal vertigo, unspecified ear: Secondary | ICD-10-CM | POA: Insufficient documentation

## 2013-04-18 MED ORDER — MECLIZINE HCL 25 MG PO TABS: 25.0000 mg | ORAL_TABLET | Freq: Three times a day (TID) | ORAL | Status: DC | PRN

## 2013-04-18 NOTE — Progress Notes (Signed)
Patient presents to clinic today c/o intermittent vertigo x 2 weeks.  Patient states dizziness occurs with quick turning of her head.  Does not bother her if she is still.  Denies lightheadedness.  Episodes last 10-15 seconds.  Endorses occasional nausea but denies emesis.  Denies AMS.  Endorses history of BPPV in the past treated with Meclizine.  Past Medical History  Diagnosis Date  . Bronchiectasis     oxygen at night   . MAI (mycobacterium avium-intracellulare)   . CAD (coronary artery disease)   . Hyperlipidemia   . HTN (hypertension)   . GERD (gastroesophageal reflux disease)   . History of shingles 04/2012  . Hypothyroidism   . Neuritis of upper extremity     Current Outpatient Prescriptions on File Prior to Visit  Medication Sig Dispense Refill  . acetaminophen (TYLENOL) 650 MG CR tablet every 8 (eight) hours as needed for pain. 1 - 2 tablets daily for pain.      Marland Kitchen ALPRAZolam (XANAX) 0.25 MG tablet One tablet by mouth at bedtime as needed for sleep  30 tablet  0  . amitriptyline (ELAVIL) 50 MG tablet Take 1 tablet (50 mg total) by mouth at bedtime.  90 tablet  1  . aspirin 81 MG tablet Take 81 mg by mouth daily.       Marland Kitchen atorvastatin (LIPITOR) 80 MG tablet Take 1 tablet (80 mg total) by mouth daily.  90 tablet  3  . diphenhydrAMINE (BENADRYL) 25 MG tablet Take 50 mg by mouth at bedtime.       . levalbuterol (XOPENEX HFA) 45 MCG/ACT inhaler Inhale 1-2 puffs into the lungs 2 (two) times daily as needed for wheezing or shortness of breath.       . Multiple Vitamin (MULTIVITAMIN) tablet Take 1 tablet by mouth daily.       . nebivolol (BYSTOLIC) 5 MG tablet Take 5 mg by mouth daily.      . OXYGEN-HELIUM IN Inhale 2.5 L into the lungs at bedtime as needed.       . SYMBICORT 160-4.5 MCG/ACT inhaler inhale 2 puffs by mouth twice a day  10.2 g  11  . SYNTHROID 75 MCG tablet Take 1 tablet (75 mcg total) by mouth daily before breakfast.  90 tablet  1  . valsartan (DIOVAN) 160 MG tablet Take  160 mg by mouth daily.       No current facility-administered medications on file prior to visit.    Allergies  Allergen Reactions  . Ciprofloxacin Other (See Comments)    Body aches  . Codeine Nausea Only    REACTION: nausea  . Erythromycin Nausea Only    REACTION: nausea  . Levofloxacin Other (See Comments)    REACTION: aches    Family History  Problem Relation Age of Onset  . Cancer Mother     colon  . Hyperlipidemia Mother   . Hypertension Mother   . Heart disease Mother   . Stroke Mother   . Diabetes Mother   . Cancer Father     colon  . Arthritis Father   . Hyperlipidemia Father   . Hypertension Father   . Heart disease Father   . Stroke Father   . Atopy Neg Hx     History   Social History  . Marital Status: Single    Spouse Name: N/A    Number of Children: N/A  . Years of Education: N/A   Social History Main Topics  . Smoking status:  Former Smoker -- 1.00 packs/day for 20 years    Types: Cigarettes    Quit date: 01/25/1970  . Smokeless tobacco: Never Used  . Alcohol Use: No  . Drug Use: No  . Sexual Activity: None   Other Topics Concern  . None   Social History Narrative  . None    Review of Systems - See HPI.  All other ROS are negative.  BP 124/74  Pulse 83  Temp(Src) 98.3 F (36.8 C) (Oral)  Ht 5\' 2"  (1.575 m)  Wt 135 lb 8 oz (61.462 kg)  BMI 24.78 kg/m2  SpO2 93%  Physical Exam  Vitals reviewed. Constitutional: She is oriented to person, place, and time and well-developed, well-nourished, and in no distress.  HENT:  Head: Normocephalic and atraumatic.  Right Ear: External ear normal.  Left Ear: External ear normal.  Nose: Nose normal.  Mouth/Throat: Oropharynx is clear and moist. No oropharyngeal exudate.  TM within normal limits bilaterally.  No TTP of sinuses noted on examination.  Eyes: Conjunctivae are normal. Pupils are equal, round, and reactive to light.  Dix-Hallpike Maneuver reveals right-beading nystagmus with  recumbency.  Neck: Neck supple.  Cardiovascular: Normal rate, normal heart sounds and intact distal pulses.   Pulmonary/Chest: Effort normal and breath sounds normal. No respiratory distress. She has no wheezes. She has no rales. She exhibits no tenderness.  Neurological: She is alert and oriented to person, place, and time. No cranial nerve deficit.  Skin: Skin is warm and dry. No rash noted.  Psychiatric: Affect normal.    Recent Results (from the past 2160 hour(s))  TSH     Status: None   Collection Time    02/12/13 10:05 AM      Result Value Ref Range   TSH 1.187  0.350 - 4.500 uIU/mL  HEMOGLOBIN A1C     Status: Abnormal   Collection Time    02/12/13 10:05 AM      Result Value Ref Range   Hemoglobin A1C 6.1 (*) <5.7 %   Comment:                                                                            According to the ADA Clinical Practice Recommendations for 2011, when     HbA1c is used as a screening test:             >=6.5%   Diagnostic of Diabetes Mellitus                (if abnormal result is confirmed)           5.7-6.4%   Increased risk of developing Diabetes Mellitus           References:Diagnosis and Classification of Diabetes Mellitus,Diabetes     ZDGU,4403,47(QQVZD 1):S62-S69 and Standards of Medical Care in             Diabetes - 2011,Diabetes Care,2011,34 (Suppl 1):S11-S61.         Mean Plasma Glucose 128 (*) <117 mg/dL    Assessment/Plan: Benign paroxysmal positional vertigo Rx Meclizine.  Monitor salt intake.  Stay well hydrated.  Epley Maneuvers printed and patient instructed to perform 3 x day.  Follow-up if symptoms are  still present in 1 week or if symptoms worsen.  May need Vestibular Rehab if symptoms persist.

## 2013-04-18 NOTE — Patient Instructions (Signed)
Please take Meclizine twice a day.  Do not take at bedtime with Xanax.  Stay well hydrated but monitor salt intake.  Please read handout on Epley Exercises.  If symptoms do not improve, please call or return to clinic.  Benign Positional Vertigo Vertigo means you feel like you or your surroundings are moving when they are not. Benign positional vertigo is the most common form of vertigo. Benign means that the cause of your condition is not serious. Benign positional vertigo is more common in older adults. CAUSES  Benign positional vertigo is the result of an upset in the labyrinth system. This is an area in the middle ear that helps control your balance. This may be caused by a viral infection, head injury, or repetitive motion. However, often no specific cause is found. SYMPTOMS  Symptoms of benign positional vertigo occur when you move your head or eyes in different directions. Some of the symptoms may include:  Loss of balance and falls.  Vomiting.  Blurred vision.  Dizziness.  Nausea.  Involuntary eye movements (nystagmus). DIAGNOSIS  Benign positional vertigo is usually diagnosed by physical exam. If the specific cause of your benign positional vertigo is unknown, your caregiver may perform imaging tests, such as magnetic resonance imaging (MRI) or computed tomography (CT). TREATMENT  Your caregiver may recommend movements or procedures to correct the benign positional vertigo. Medicines such as meclizine, benzodiazepines, and medicines for nausea may be used to treat your symptoms. In rare cases, if your symptoms are caused by certain conditions that affect the inner ear, you may need surgery. HOME CARE INSTRUCTIONS   Follow your caregiver's instructions.  Move slowly. Do not make sudden body or head movements.  Avoid driving.  Avoid operating heavy machinery.  Avoid performing any tasks that would be dangerous to you or others during a vertigo episode.  Drink enough fluids  to keep your urine clear or pale yellow. SEEK IMMEDIATE MEDICAL CARE IF:   You develop problems with walking, weakness, numbness, or using your arms, hands, or legs.  You have difficulty speaking.  You develop severe headaches.  Your nausea or vomiting continues or gets worse.  You develop visual changes.  Your family or friends notice any behavioral changes.  Your condition gets worse.  You have a fever.  You develop a stiff neck or sensitivity to light. MAKE SURE YOU:   Understand these instructions.  Will watch your condition.  Will get help right away if you are not doing well or get worse. Document Released: 10/19/2005 Document Revised: 04/05/2011 Document Reviewed: 10/01/2010 [facility] Patient Information 2014 [place].

## 2013-04-18 NOTE — Progress Notes (Signed)
Pre visit review using our clinic review tool, if applicable. No additional management support is needed unless otherwise documented below in the visit note. 

## 2013-04-18 NOTE — Assessment & Plan Note (Signed)
Rx Meclizine.  Monitor salt intake.  Stay well hydrated.  Epley Maneuvers printed and patient instructed to perform 3 x day.  Follow-up if symptoms are still present in 1 week or if symptoms worsen.  May need Vestibular Rehab if symptoms persist.

## 2013-04-19 ENCOUNTER — Telehealth: Payer: Self-pay | Admitting: *Deleted

## 2013-04-19 NOTE — Telephone Encounter (Signed)
Alprazolam 0.25 mg called in to [facility] on [address]. JG//CMA

## 2013-04-29 ENCOUNTER — Other Ambulatory Visit: Payer: Self-pay | Admitting: Cardiology

## 2013-05-13 ENCOUNTER — Other Ambulatory Visit: Payer: Self-pay | Admitting: Family

## 2013-05-14 NOTE — Telephone Encounter (Signed)
Pt has follow up 08/13/13.  Rx last filled 04/16/13.  Please advise:  Medication name:  Name from pharmacy:  ALPRAZolam (XANAX) 0.25 MG tablet  ALPRAZOLAM 0.25 MG TABLET Sig: take 1 tablet by mouth at bedtime if needed for sleep Dispense: 30 tablet (Pharmacy requested 30) Refills: 0 Start: 05/13/2013 Class: Normal Requested on: 04/16/2013 Originally ordered on: 02/12/2013 Last refill: 04/16/2013

## 2013-05-14 NOTE — Telephone Encounter (Signed)
OK to send 30 tabs with zero refills.  

## 2013-05-15 NOTE — Telephone Encounter (Signed)
Rx called to pharmacy voicemail as below. 

## 2013-05-29 ENCOUNTER — Encounter: Payer: Self-pay | Admitting: Family

## 2013-05-29 LAB — HEMOGLOBIN A1C
Hgb A1c MFr Bld: 5.9 % — ABNORMAL HIGH (ref <5.7)
Mean Plasma Glucose: 123 mg/dL — ABNORMAL HIGH (ref <117)

## 2013-06-10 ENCOUNTER — Other Ambulatory Visit: Payer: Self-pay | Admitting: Family

## 2013-06-11 NOTE — Telephone Encounter (Signed)
Please advise.  Medication name:  Name from pharmacy:  ALPRAZolam (XANAX) 0.25 MG tablet  ALPRAZOLAM 0.25 MG TABLET Sig: take 1 tablet by mouth at bedtime if needed Dispense: 30 tablet (Pharmacy requested 30) Refills: 0 Start: 06/10/2013 Class: Normal Requested on: 05/15/2013 Originally ordered on: 02/12/2013 Last refill: 05/15/2013

## 2013-06-11 NOTE — Telephone Encounter (Signed)
Rx called to pharmacist.  

## 2013-06-11 NOTE — Telephone Encounter (Signed)
Ok to send 30 tabs zero refills. 

## 2013-06-19 ENCOUNTER — Telehealth: Payer: Self-pay | Admitting: Family

## 2013-06-19 MED ORDER — AMITRIPTYLINE HCL 50 MG PO TABS: 50.0000 mg | ORAL_TABLET | Freq: Every day | ORAL | Status: DC

## 2013-06-19 NOTE — Telephone Encounter (Signed)
Pt left message on voicemail that she would like amitriptyline refill to go to [facility] on [address] as she has been having difficulty getting refills at [facility]. Pt requests confirmation once we send refill. Refill faxed and message left on voicemail re: completion and to call if any concerns.

## 2013-06-19 NOTE — Telephone Encounter (Signed)
Refill- amitriptylin  [facility] on [address]

## 2013-07-09 ENCOUNTER — Ambulatory Visit: Payer: Medicare Other | Admitting: Internal Medicine

## 2013-07-15 ENCOUNTER — Other Ambulatory Visit: Payer: Self-pay | Admitting: Family

## 2013-07-16 NOTE — Telephone Encounter (Signed)
Refill request for alprazolam Last filled by MD on - 06/11/2013 #30 x0 Last Appt:04/18/2013 Next Appt: 08/13/2013 Please advise refill?

## 2013-07-16 NOTE — Telephone Encounter (Signed)
Ok to send 30 tabs zero refills. 

## 2013-07-23 NOTE — Telephone Encounter (Signed)
Refill called to pharmacy voicemail and left message on pt's voicemail re: rx completion and to call if further questions.

## 2013-08-13 ENCOUNTER — Ambulatory Visit (INDEPENDENT_AMBULATORY_CARE_PROVIDER_SITE_OTHER): Payer: Medicare Other | Admitting: Family

## 2013-08-13 ENCOUNTER — Other Ambulatory Visit: Payer: Self-pay | Admitting: Internal Medicine

## 2013-08-13 ENCOUNTER — Encounter: Payer: Self-pay | Admitting: Family

## 2013-08-13 VITALS — BP 100/70 | HR 66 | Temp 97.8°F | Resp 16 | Ht 62.0 in | Wt 137.0 lb

## 2013-08-13 DIAGNOSIS — E785 Hyperlipidemia, unspecified: Secondary | ICD-10-CM

## 2013-08-13 DIAGNOSIS — H8110 Benign paroxysmal vertigo, unspecified ear: Secondary | ICD-10-CM

## 2013-08-13 DIAGNOSIS — I10 Essential (primary) hypertension: Secondary | ICD-10-CM

## 2013-08-13 DIAGNOSIS — E039 Hypothyroidism, unspecified: Secondary | ICD-10-CM

## 2013-08-13 DIAGNOSIS — R5381 Other malaise: Secondary | ICD-10-CM

## 2013-08-13 DIAGNOSIS — R5383 Other fatigue: Secondary | ICD-10-CM

## 2013-08-13 DIAGNOSIS — H8113 Benign paroxysmal vertigo, bilateral: Secondary | ICD-10-CM

## 2013-08-13 DIAGNOSIS — R5382 Chronic fatigue, unspecified: Secondary | ICD-10-CM

## 2013-08-13 DIAGNOSIS — J479 Bronchiectasis, uncomplicated: Secondary | ICD-10-CM

## 2013-08-13 LAB — CBC WITH DIFFERENTIAL/PLATELET
BASOS ABS: 0 10*3/uL (ref 0.0–0.1)
Basophils Relative: 0 % (ref 0–1)
EOS PCT: 2 % (ref 0–5)
Eosinophils Absolute: 0.1 10*3/uL (ref 0.0–0.7)
HCT: 39.9 % (ref 36.0–46.0)
Hemoglobin: 13.8 g/dL (ref 12.0–15.0)
Lymphocytes Relative: 17 % (ref 12–46)
Lymphs Abs: 1.2 10*3/uL (ref 0.7–4.0)
MCH: 31.4 pg (ref 26.0–34.0)
MCHC: 34.6 g/dL (ref 30.0–36.0)
MCV: 90.7 fL (ref 78.0–100.0)
Monocytes Absolute: 0.5 10*3/uL (ref 0.1–1.0)
Monocytes Relative: 7 % (ref 3–12)
Neutro Abs: 5.3 10*3/uL (ref 1.7–7.7)
Neutrophils Relative %: 74 % (ref 43–77)
PLATELETS: 309 10*3/uL (ref 150–400)
RBC: 4.4 MIL/uL (ref 3.87–5.11)
RDW: 13.4 % (ref 11.5–15.5)
WBC: 7.2 10*3/uL (ref 4.0–10.5)

## 2013-08-13 LAB — HEPATIC FUNCTION PANEL
ALT: 17 U/L (ref 0–35)
AST: 23 U/L (ref 0–37)
Albumin: 3.9 g/dL (ref 3.5–5.2)
Alkaline Phosphatase: 88 U/L (ref 39–117)
Bilirubin, Direct: 0.1 mg/dL (ref 0.0–0.3)
Indirect Bilirubin: 0.3 mg/dL (ref 0.2–1.2)
TOTAL PROTEIN: 6.6 g/dL (ref 6.0–8.3)
Total Bilirubin: 0.4 mg/dL (ref 0.2–1.2)

## 2013-08-13 LAB — LIPID PANEL
CHOLESTEROL: 149 mg/dL (ref 0–200)
HDL: 64 mg/dL (ref >39)
LDL CALC: 66 mg/dL (ref 0–99)
TRIGLYCERIDES: 96 mg/dL (ref <150)
Total CHOL/HDL Ratio: 2.3 Ratio
VLDL: 19 mg/dL (ref 0–40)

## 2013-08-13 LAB — BASIC METABOLIC PANEL WITH GFR
BUN: 12 mg/dL (ref 6–23)
CALCIUM: 8.5 mg/dL (ref 8.4–10.5)
CO2: 29 mEq/L (ref 19–32)
CREATININE: 0.82 mg/dL (ref 0.50–1.10)
Chloride: 102 mEq/L (ref 96–112)
GFR, Est African American: 84 mL/min
GFR, Est Non African American: 73 mL/min
Glucose, Bld: 92 mg/dL (ref 70–99)
Potassium: 4.4 mEq/L (ref 3.5–5.3)
Sodium: 138 mEq/L (ref 135–145)

## 2013-08-13 LAB — TSH: TSH: 0.479 u[IU]/mL (ref 0.350–4.500)

## 2013-08-13 MED ORDER — ALPRAZOLAM 0.25 MG PO TABS: ORAL_TABLET | ORAL | Status: DC

## 2013-08-13 MED ORDER — SYNTHROID 75 MCG PO TABS: 75.0000 ug | ORAL_TABLET | Freq: Every day | ORAL | Status: DC

## 2013-08-13 NOTE — Progress Notes (Signed)
Pre visit review using our clinic review tool, if applicable. No additional management support is needed unless otherwise documented below in the visit note. 

## 2013-08-13 NOTE — Assessment & Plan Note (Signed)
Vertigo has improved.Still  has vertigo if she looks straight up and to right side. Taking meclizine if needed. Continue same.

## 2013-08-13 NOTE — Progress Notes (Signed)
Subjective:    Patient ID: Belinda Day, female    DOB: [birth year], 68 y.o.   MRN: [id number]  HPI Ms. Belinda Day is a 68 yo old patient here today for follow up.  BPPV - Vertigo has improved.Still  has vertigo if she looks straight up and to right side. Taking meclizine if needed.  HTN:  Stable on losartan and bystolic. BP Readings from Last 3 Encounters:  08/13/13 100/70  04/18/13 124/74  04/12/13 126/80   CAD: See Dr. Stanford Breed yearly in December. Last check up was good.  Hyperlipidemia - taking atorvastatin. Denies mylagias.   Follows with Dr. Melvyn Novas for pulmonary. Has appointment tomorrow am.  Reports new onset of fatigue. Has had for two months. Review of Systems  Constitutional: Positive for fatigue. Negative for fever, chills, diaphoresis and appetite change.  Respiratory: Positive for cough and shortness of breath.   Cardiovascular: Negative for chest pain, palpitations and leg swelling.  Genitourinary: Negative for difficulty urinating.  Neurological: Negative for headaches.  Hematological: Negative for adenopathy.  Psychiatric/Behavioral:       Denies anxiety/depression.   Past Medical History  Diagnosis Date  . Bronchiectasis     oxygen at night   . MAI (mycobacterium avium-intracellulare)   . CAD (coronary artery disease)   . Hyperlipidemia   . HTN (hypertension)   . GERD (gastroesophageal reflux disease)   . History of shingles 04/2012  . Hypothyroidism   . Neuritis of upper extremity     History   Social History  . Marital Status: Single    Spouse Name: N/A    Number of Children: N/A  . Years of Education: N/A   Occupational History  . Not on file.   Social History Main Topics  . Smoking status: Former Smoker -- 1.00 packs/day for 20 years    Types: Cigarettes    Quit date: 01/25/1970  . Smokeless tobacco: Never Used  . Alcohol Use: No  . Drug Use: No  . Sexual Activity: Not on file   Other Topics Concern  . Not on file   Social  History Narrative  . No narrative on file    Past Surgical History  Procedure Laterality Date  . Coronary artery bypass graft  2002    Family History  Problem Relation Age of Onset  . Cancer Mother     colon  . Hyperlipidemia Mother   . Hypertension Mother   . Heart disease Mother   . Stroke Mother   . Diabetes Mother   . Cancer Father     colon  . Arthritis Father   . Hyperlipidemia Father   . Hypertension Father   . Heart disease Father   . Stroke Father   . Atopy Neg Hx     Allergies  Allergen Reactions  . Ciprofloxacin Other (See Comments)    Body aches  . Codeine Nausea Only    REACTION: nausea  . Erythromycin Nausea Only    REACTION: nausea  . Levofloxacin Other (See Comments)    REACTION: aches    Current Outpatient Prescriptions on File Prior to Visit  Medication Sig Dispense Refill  . acetaminophen (TYLENOL) 650 MG CR tablet every 8 (eight) hours as needed for pain. 1 - 2 tablets daily for pain.      Marland Kitchen amitriptyline (ELAVIL) 50 MG tablet Take 1 tablet (50 mg total) by mouth at bedtime.  90 tablet  1  . aspirin 81 MG tablet Take 81 mg by mouth daily.       Marland Kitchen  atorvastatin (LIPITOR) 80 MG tablet Take 1 tablet (80 mg total) by mouth daily.  90 tablet  3  . BYSTOLIC 5 MG tablet take 1 tablet by mouth once daily  90 tablet  3  . DIOVAN 160 MG tablet take 1 tablet by mouth once daily  90 tablet  3  . levalbuterol (XOPENEX HFA) 45 MCG/ACT inhaler Inhale 1-2 puffs into the lungs 2 (two) times daily as needed for wheezing or shortness of breath.       . meclizine (ANTIVERT) 25 MG tablet Take 1 tablet (25 mg total) by mouth 3 (three) times daily as needed for dizziness or nausea.  30 tablet  3  . Multiple Vitamin (MULTIVITAMIN) tablet Take 1 tablet by mouth daily.       . SYMBICORT 160-4.5 MCG/ACT inhaler inhale 2 puffs by mouth twice a day  10.2 g  11  . OXYGEN-HELIUM IN Inhale 2.5 L into the lungs at bedtime as needed.        No current facility-administered  medications on file prior to visit.    BP 100/70  Pulse 66  Temp(Src) 97.8 F (36.6 C) (Oral)  Resp 16  Ht 5\' 2"  (1.575 m)  Wt 137 lb (62.143 kg)  BMI 25.05 kg/m2  SpO2 97%       Objective:   Physical Exam  Constitutional: She is oriented to person, place, and time. She appears well-developed and well-nourished. No distress.  HENT:  Head: Normocephalic and atraumatic.  Mouth/Throat: No oropharyngeal exudate.  Eyes: Pupils are equal, round, and reactive to light. No scleral icterus.  Cardiovascular: Normal rate, regular rhythm and intact distal pulses.  Exam reveals no gallop and no friction rub.   No murmur heard. Pulmonary/Chest: Effort normal. No respiratory distress. She has wheezes. She has no rales.  Abdominal: Soft. Bowel sounds are normal. She exhibits no distension and no mass. There is no tenderness. There is no rebound and no guarding.  Musculoskeletal: She exhibits no edema.  Lymphadenopathy:    She has no cervical adenopathy.  Neurological: She is alert and oriented to person, place, and time.  Skin: Skin is warm and dry. She is not diaphoretic.  Psychiatric: She has a normal mood and affect. Her behavior is normal.          Assessment & Plan:  Patient seen by [facility] NP-student.  I have personally seen and examined patient and agree with Ms. Whitmire's assessment and plan.

## 2013-08-13 NOTE — Assessment & Plan Note (Addendum)
Reports fatigue for 2 months. Will obtain CBC and TSH.

## 2013-08-13 NOTE — Assessment & Plan Note (Signed)
No myalgias. Obtain LFTs and lipid profile today.

## 2013-08-13 NOTE — Assessment & Plan Note (Signed)
Stable on losartan and bystolic. Check BMET today. BP Readings from Last 3 Encounters:  08/13/13 100/70  04/18/13 124/74  04/12/13 126/80

## 2013-08-13 NOTE — Patient Instructions (Signed)
Please complete lab work prior to leaving.  Follow up in 6 months.  

## 2013-08-14 ENCOUNTER — Encounter: Payer: Self-pay | Admitting: Internal Medicine

## 2013-08-14 ENCOUNTER — Ambulatory Visit (INDEPENDENT_AMBULATORY_CARE_PROVIDER_SITE_OTHER): Payer: Medicare Other | Admitting: Internal Medicine

## 2013-08-14 ENCOUNTER — Ambulatory Visit (INDEPENDENT_AMBULATORY_CARE_PROVIDER_SITE_OTHER)
Admission: RE | Admit: 2013-08-14 | Discharge: 2013-08-14 | Disposition: A | Discharge status: Home / Self Care | Payer: Medicare Other | Source: Ambulatory Visit | Attending: Internal Medicine | Admitting: Internal Medicine

## 2013-08-14 VITALS — BP 130/72 | HR 73 | Temp 98.4°F | Ht 60.25 in | Wt 137.0 lb

## 2013-08-14 DIAGNOSIS — J479 Bronchiectasis, uncomplicated: Secondary | ICD-10-CM

## 2013-08-14 DIAGNOSIS — Z23 Encounter for immunization: Secondary | ICD-10-CM

## 2013-08-14 DIAGNOSIS — G4734 Idiopathic sleep related nonobstructive alveolar hypoventilation: Secondary | ICD-10-CM

## 2013-08-14 MED ORDER — LEVALBUTEROL TARTRATE 45 MCG/ACT IN AERO: 1.0000 | INHALATION_SPRAY | Freq: Two times a day (BID) | RESPIRATORY_TRACT | Status: DC | PRN

## 2013-08-14 MED ORDER — SULFAMETHOXAZOLE-TMP DS 800-160 MG PO TABS: 1.0000 | ORAL_TABLET | Freq: Two times a day (BID) | ORAL | Status: DC

## 2013-08-14 NOTE — Patient Instructions (Addendum)
Ok to cancel your oxygen effective today   Prevnar -13 today   No change symbicort 160 Take 2 puffs first thing in am and then another 2 puffs about 12 hours later.   Only use your albuterol(xopenex) as a rescue medication to be used if you can't catch your breath by resting or doing a relaxed purse lip breathing pattern.  - The less you use it, the better it will work when you need it. - Ok to use up to 2 puffs  every 4 hours if you must but call for immediate appointment if use goes up over your usual need - Don't leave home without it !!  (think of it like the spare tire for your car)   Please remember to go to the  x-ray department downstairs for your tests - we will call you with the results when they are available.     Please schedule a follow up visit in 6 months but call sooner if needed  Last add : needs alpha one screening for EPIC

## 2013-08-14 NOTE — Progress Notes (Signed)
PFT done today. 

## 2013-08-15 ENCOUNTER — Encounter: Payer: Self-pay | Admitting: Family

## 2013-08-15 ENCOUNTER — Telehealth: Payer: Self-pay | Admitting: Internal Medicine

## 2013-08-15 MED ORDER — ALBUTEROL SULFATE HFA 108 (90 BASE) MCG/ACT IN AERS: 2.0000 | INHALATION_SPRAY | Freq: Four times a day (QID) | RESPIRATORY_TRACT | Status: DC | PRN

## 2013-08-15 NOTE — Progress Notes (Signed)
Quick Note:  ATC, NA and no option to leave a msg, WCB ______ 

## 2013-08-15 NOTE — Addendum Note (Signed)
Addended by: Carlos American A on: 08/15/2013 03:17 PM   Modules accepted: Orders

## 2013-08-15 NOTE — Telephone Encounter (Signed)
proair 2 q4h prn

## 2013-08-15 NOTE — Telephone Encounter (Signed)
Per MW ok to change to proair from xopenex since not covered Advised pt of this and that it would be at [facility] for pick up. Pt verbalized understanding and nothing further needed

## 2013-08-15 NOTE — Telephone Encounter (Signed)
Pt reports when she went to pharmacy to pick up rescue inhaler xopenex that pharmacy stated it need a prior authorization and would not issue inhaler. Pt is requesting an alternative rescue inhaler be sent to pharmacy so she does not have to go through a prior authorization. Please advise Dr.  Melvyn Novas if you have an alternative rescue inhaler you recommend for this patient.  thanks

## 2013-08-16 NOTE — Assessment & Plan Note (Signed)
-   12/12/2012  Walked RA x 3 laps @ 185 ft each stopped due to end of study, no sob or desats  rx -= 2.5 lpm at hs prn flare of bronchiectasis > d/c all 02 08/14/13

## 2013-08-16 NOTE — Assessment & Plan Note (Addendum)
-   PFT's 10/14/2010  FEV1  1.25 (68%) and ratio 58% and DLCO 90%     - PFT's 10/29/2011  FEV1  1.33 (74%) and ratio 57 % and DLCO 93%    - PFTs 08/14/2013   FEV1  1.16 (60%) and ratio 61 with dlco 83%   The proper method of use, as well as anticipated side effects, of a metered-dose inhaler are discussed and demonstrated to the patient. Improved effectiveness after extensive coaching during this visit to a level of approximately  90% though baseline was < 50%  Alpha one status not recorded in epic, will search old records and update at next ov     Each maintenance medication was reviewed in detail including most importantly the difference between maintenance and as needed and under what circumstances the prns are to be used.  Please see instructions for details which were reviewed in writing and the patient given a copy.    Marland Kitchen

## 2013-08-16 NOTE — Progress Notes (Signed)
Subjective:     Patient ID: Belinda Day, female   DOB: 12-23-1945    MRN: [id number]   Brief patient profile:  68 yowf quit smoking 1972 with documented right middle lobe  syndrome and evidence of bronchiectasis by CT scan in March 2002,    History of Present Illness  08/08/07 FOB with classic cobblestoning and MAI on culture.   08/15/07 given Levaquin x 10 days with resolution bloody mucus, but "felt she had flu the whole time" with aches, feverish   August 29, 2007 ov: first post bronch co still coughing up mucus clear and initiate rx with symbicort/ [facility]   October 19, 2007 ov no cough , sob, feeling great but no improvement on cxr   December 07, 2007 ov feeling great, minimal am cough not productive. No sob.   Opth eval, labs ok 10/09   September 06, 2008 ov overall better over the last year, less tendency to exac on zmax and ethambutol. rec complete another year > satisfied improved 90% and stopped zmax and eth 08/2009   November 19, 2009 ov Cough- retrned after d/c abx in July- prod with clear sputum. breathing ok and no nocturnal co's or early am exac. Levaquin x 750 mg x 5 day cycles for worsening cough/ congestion or discolored sputum  Work on perfecting inhaler technique: and continue symbicort one twice because too shaky on 2bid   March 31, 2010 ov cough worse levaquin not able to tol and didn't help like zmax and ethambutol did and wants to restart. mucus minimally discolored, worse in am, no sign sob. rec 1) Cipro 750 one twice daily for 7 days for cough flare > tol well 2) Call if not better to restart zmax and ehtambutal.  07/16/2010 ov/Belinda Day better,  No cough or sob.  rec Please schedule a follow up visit in 3 months but call sooner if needed with CXR and PFT's on return If cipro x 7 days not adequate call for earlier follow up    10/14/2010 f/u ov/Belinda Day maintained on symbicort 80 2bid limited to walking nl pace but can "go anywhere" and able to kayak,  throat clearing day more night with no am exac but cough def worse off MAI rx rec Work on inhaler technique:  Stop metaprolol 50 mg and replace it with bystolic 5mg  one twice daily x 2 weeks to see if it makes any difference in cough or breathing and if so let us know, if not resume the metaprolol     05/04/2011 f/u ov/Belinda Day cc cough much better p rx with bactrim, no more purulent sputum, no hemoptysis, no limiting sob. rec Ok to use bactrim x 10 days for flare of nasty mucus Work on inhaler technique  07/30/2011 f/u ov/Belinda Day cc Patient had a respiratory infection x 2 weeks in June, would like to discuss rescue inhaler > sob and cough back to nl x hoarseness . No limiting doe. rec mucinex dm up 1200mg  every 12 hours as needed for cough Try prilosec 20mg   Take 30-60 min before first meal of the day and Pepcid 20 mg one bedtime until cough and hoarseness  are both  is completely gone for at least a week without the need for cough suppression Only use your xopenex hfa as a rescue medication  Please schedule a follow up visit in 3 months but call sooner if needed with CXR and PFT's  10/29/2011 f/u ov/Belinda Day cc breathing better, every 3 months or so  flare of purulent sputum/ fever  Rx bactrim / symbicort 160 2 bid maint  and only only need xopenex 3 times last 3 months with excellent ex tolerance.  rec No change rx   12/12/2012 f/u ov/Belinda Day re: bronchiectasis c a/f obst on symbicort 160 2 bid Chief Complaint  Patient presents with  . Follow-up    Pt states doing well and denies any new co's today   Has bactrim ds to take prn did one course since last ov Not limited by breathing from any desired activiites, no need for saba  Min am mucus mucus better p coffee. rec Keep your xopenex with you when you go out in case you need to use it as needed   08/15/2013 f/u ov/Belinda Day re:  Bronchiectasis with airflow obst  Chief Complaint  Patient presents with  . Followup with PFT    Pt states breathing has  been worse over the past several months- relates to humid/hot weather. She c/o increased cough for the past month- prod with pale yellow sputum.      rarely if ever using saba, has not used bactrim in sev months as Not limited by breathing from desired activities  - cough is worse in am's but min sputum vol/discoloration   No obvious daytime variabilty or assoc sob or cp or chest tightness, subjective wheeze overt sinus or hb symptoms. No unusual exp hx or h/o childhood pna/ asthma or premature birth to her knowledge.   Sleeping ok without nocturnal  or early am exacerbation  of respiratory  c/o's or need for noct saba. Also denies any obvious fluctuation of symptoms with weather or environmental changes or other aggravating or alleviating factors except as outlined above      ROS  The following are not active complaints unless bolded sore throat, dysphagia, dental problems, itching, sneezing,  nasal congestion or excess/ purulent secretions, ear ache,   fever, chills, sweats, unintended wt loss, pleuritic or exertional cp, hemoptysis,  orthopnea pnd or leg swelling, presyncope, palpitations, heartburn, abdominal pain, anorexia, nausea, vomiting, diarrhea  or change in bowel or urinary habits, change in stools or urine, dysuria,hematuria,  rash, arthralgias, visual complaints, headache, numbness weakness or ataxia or problems with walking or coordination,  change in mood/affect or memory.                  Past Medical History:  Bronchiectasis see CT SE 04/13/00  - HFA 75% November 19, 2009  MAI  - Rx Zmax and [facility] 08/29/07 > 08/2009  - Eye eval   10/09.......................Marland KitchenPinckard so try cycles of cipro April 02, 2010 > changed to bactrim 03/2011 HEALTH MAINTENANCE...........................[name] - Td 10/2007  - Pneumovax 2005   and 10/29/2011 age 68, prevnar 08/16/2013  CAD  Hyperlipidemia  Hypertension  History of cough with ACE inhibition.  Gastroesophageal reflux  disease             Objective:   Physical Exam   In general she is an extremely  pleasant ambulatory white female in no acute distress.   Wt 130 October 19, 2007>140 March 31, 2010 > 127 07/16/2010 > 10/14/2010  120 > 05/04/2011  117 > 07/30/2011  120 > 10/29/2011 118 > 130  05/01/2012 > 12/12/2012 132 >  08/14/13 137  HEENT: nl dentition, turbinates, and orophanx. Nl external ear canals without cough reflex  Neck without JVD/Nodes/TM  Lungs minimal exp late rhonchi bilaterally with a few insp pops and sqeaks as well  RRR no s3 or murmur or increase in P2  Abd soft and benign with nl excursion in the supine position. No bruits or organomegaly  Ext warm without calf tenderness, cyanosis clubbing or edema         08/14/13 Stable chronic lung disease with bronchiectasis and postoperative  appearance of the chest. No new cardiopulmonary abnormality.      Assessment:

## 2013-08-20 LAB — PULMONARY FUNCTION TEST
DL/VA % pred: 118 %
DL/VA: 5.09 ml/min/mmHg/L
DLCO unc % pred: 83 %
DLCO unc: 16.03 ml/min/mmHg
FEF 25-75 PRE: 0.61 L/s
FEF 25-75 Post: 0.73 L/sec
FEF2575-%Change-Post: 21 %
FEF2575-%PRED-POST: 43 %
FEF2575-%Pred-Pre: 36 %
FEV1-%CHANGE-POST: 10 %
FEV1-%PRED-PRE: 60 %
FEV1-%Pred-Post: 66 %
FEV1-POST: 1.28 L
FEV1-PRE: 1.16 L
FEV1FVC-%CHANGE-POST: 6 %
FEV1FVC-%PRED-PRE: 79 %
FEV6-%Change-Post: 4 %
FEV6-%PRED-POST: 81 %
FEV6-%Pred-Pre: 77 %
FEV6-Post: 1.96 L
FEV6-Pre: 1.88 L
FEV6FVC-%Change-Post: 0 %
FEV6FVC-%PRED-PRE: 103 %
FEV6FVC-%Pred-Post: 104 %
FVC-%Change-Post: 3 %
FVC-%Pred-Post: 77 %
FVC-%Pred-Pre: 75 %
FVC-Post: 1.97 L
FVC-Pre: 1.91 L
POST FEV1/FVC RATIO: 65 %
Post FEV6/FVC ratio: 99 %
Pre FEV1/FVC ratio: 61 %
Pre FEV6/FVC Ratio: 99 %
RV % pred: 96 %
RV: 1.95 L
TLC % pred: 90 %
TLC: 4.05 L

## 2013-09-12 ENCOUNTER — Telehealth: Payer: Self-pay

## 2013-09-12 MED ORDER — ALPRAZOLAM 0.25 MG PO TABS: 0.2500 mg | ORAL_TABLET | Freq: Once | ORAL | Status: DC

## 2013-09-12 NOTE — Telephone Encounter (Signed)
Rx called in to pharmacy. LDM

## 2013-09-12 NOTE — Telephone Encounter (Signed)
OK to send 30 tabs of alprazolam, zero refills.

## 2013-09-12 NOTE — Telephone Encounter (Signed)
Pt stated that "[facility] will call today for refill on Alprazolam and she needs it refilled before the weekend." LDM

## 2013-10-15 ENCOUNTER — Telehealth: Payer: Self-pay | Admitting: Family

## 2013-10-15 ENCOUNTER — Other Ambulatory Visit: Payer: Self-pay | Admitting: Family

## 2013-10-15 NOTE — Telephone Encounter (Signed)
See rx. 

## 2013-10-15 NOTE — Telephone Encounter (Signed)
E Script request received; there is no need to be placing this phone note in pt's chart; Duplicate/SLS

## 2013-10-15 NOTE — Telephone Encounter (Signed)
[facility] [address] will be calling for a refill on alprazolam .25 take 1 per day

## 2013-10-15 NOTE — Telephone Encounter (Signed)
[facility] Last filled - 08.19.15, #30x0 Last AEX - 07.20.15 Next AEX - 6 Mths. Please Advise on refills/SLS

## 2013-10-18 NOTE — Telephone Encounter (Addendum)
Caller name: Maryln  Relation to pt: self  Call back number:  [phone] Pharmacy: [facility] [phone]   Reason for call: pt states pharamcy never received ALPRAZolam (XANAX) 0.25 MG tablet

## 2013-10-19 NOTE — Telephone Encounter (Signed)
Rx printed and forwarded to Provider for signature.

## 2013-10-19 NOTE — Telephone Encounter (Signed)
Rx faxed to pharmacy and notified pt. 

## 2013-11-21 ENCOUNTER — Telehealth: Payer: Self-pay | Admitting: *Deleted

## 2013-11-21 MED ORDER — ALPRAZOLAM 0.25 MG PO TABS: ORAL_TABLET | ORAL | Status: DC

## 2013-11-21 NOTE — Telephone Encounter (Signed)
Pt has f/u 02/12/14. Last alprazolam rx 10/19/13.  Rx printed and forwarded to Provider for signature.

## 2013-11-22 NOTE — Telephone Encounter (Signed)
Rx faxed to [facility]  Confirmation received.//AB/CMA

## 2013-12-17 ENCOUNTER — Other Ambulatory Visit: Payer: Self-pay | Admitting: Family

## 2013-12-24 ENCOUNTER — Telehealth: Payer: Self-pay

## 2013-12-24 MED ORDER — ALPRAZOLAM 0.25 MG PO TABS: ORAL_TABLET | ORAL | Status: DC

## 2013-12-24 NOTE — Telephone Encounter (Signed)
Rx printed and forwarded to Provider for signature.

## 2013-12-24 NOTE — Telephone Encounter (Signed)
Called and spoke with pt this morning to advise that a AWV for 2015 is needed per Medicare. Pt states that she has a f/u scheduled for 01/30/14 and declines the 2015 appointment. While speaking with pt, she stated that [facility] has sent over a request for her alprazolam and request to have that filled today (pt out of medication). Thanks

## 2014-01-07 ENCOUNTER — Encounter: Payer: Self-pay | Admitting: Family

## 2014-01-09 NOTE — Progress Notes (Signed)
HPI: FU CAD; history of coronary artery disease status post coronary artery bypass graft in 2002. Carotid Dopplers performed in January of 2012 showed 0-39% stenosis bilaterally. Nuclear study January 2015 showed an ejection fraction of 89% and normal perfusion. Since last seen, the patient has dyspnea with more extreme activities but not with routine activities. It is relieved with rest. It is not associated with chest pain. There is no orthopnea, PND or pedal edema. There is no syncope or palpitations. There is no exertional chest pain.   Current Outpatient Prescriptions  Medication Sig Dispense Refill  . acetaminophen (TYLENOL) 650 MG CR tablet every 8 (eight) hours as needed for pain. 1 - 2 tablets daily for pain.    Marland Kitchen albuterol (PROAIR HFA) 108 (90 BASE) MCG/ACT inhaler Inhale 2 puffs into the lungs every 6 (six) hours as needed for wheezing or shortness of breath. 1 Inhaler 3  . ALPRAZolam (XANAX) 0.25 MG tablet take 1 tablet by mouth at bedtime if needed 30 tablet 0  . amitriptyline (ELAVIL) 50 MG tablet take 1 tablet by mouth at bedtime 90 tablet 0  . aspirin 81 MG tablet Take 81 mg by mouth daily.     Marland Kitchen atorvastatin (LIPITOR) 80 MG tablet Take 1 tablet (80 mg total) by mouth daily. 90 tablet 3  . BYSTOLIC 5 MG tablet take 1 tablet by mouth once daily 90 tablet 3  . DIOVAN 160 MG tablet take 1 tablet by mouth once daily 90 tablet 3  . LINZESS 290 MCG CAPS capsule Take 1 capsule by mouth every morning.    . meclizine (ANTIVERT) 25 MG tablet Take 1 tablet (25 mg total) by mouth 3 (three) times daily as needed for dizziness or nausea. 30 tablet 3  . Multiple Vitamin (MULTIVITAMIN) tablet Take 1 tablet by mouth daily.     Marland Kitchen sulfamethoxazole-trimethoprim (BACTRIM DS) 800-160 MG per tablet Take 1 tablet by mouth 2 (two) times daily. 20 tablet 11  . SYMBICORT 160-4.5 MCG/ACT inhaler inhale 2 puffs by mouth twice a day 10.2 g 11  . SYNTHROID 75 MCG tablet Take 1 tablet (75 mcg total) by  mouth daily before breakfast. 90 tablet 1   No current facility-administered medications for this visit.     Past Medical History  Diagnosis Date  . Bronchiectasis     oxygen at night   . MAI (mycobacterium avium-intracellulare)   . CAD (coronary artery disease)   . Hyperlipidemia   . HTN (hypertension)   . GERD (gastroesophageal reflux disease)   . History of shingles 04/2012  . Hypothyroidism   . Neuritis of upper extremity     Past Surgical History  Procedure Laterality Date  . Coronary artery bypass graft  2002    History   Social History  . Marital Status: Single    Spouse Name: N/A    Number of Children: N/A  . Years of Education: N/A   Occupational History  . Not on file.   Social History Main Topics  . Smoking status: Former Smoker -- 1.00 packs/day for 20 years    Types: Cigarettes    Quit date: 01/25/1970  . Smokeless tobacco: Never Used  . Alcohol Use: No  . Drug Use: No  . Sexual Activity: Not on file   Other Topics Concern  . Not on file   Social History Narrative    ROS: no fevers or chills, productive cough, hemoptysis, dysphasia, odynophagia, melena, hematochezia, dysuria, hematuria, rash, seizure activity,  orthopnea, PND, pedal edema, claudication. Remaining systems are negative.  Physical Exam: Well-developed well-nourished in no acute distress.  Skin is warm and dry.  HEENT is normal.  Neck is supple.  Chest is clear to auscultation with normal expansion.  Cardiovascular exam is regular rate and rhythm.  Abdominal exam nontender or distended. No masses palpated. Extremities show no edema. neuro grossly intact  ECG sinus rhythm at a rate of 73. Anteroseptal T-wave inversion.

## 2014-01-10 ENCOUNTER — Encounter: Payer: Self-pay | Admitting: Cardiology

## 2014-01-10 ENCOUNTER — Ambulatory Visit (INDEPENDENT_AMBULATORY_CARE_PROVIDER_SITE_OTHER): Payer: Medicare Other | Admitting: Cardiology

## 2014-01-10 VITALS — BP 118/74 | HR 73 | Ht 62.0 in | Wt 135.0 lb

## 2014-01-10 DIAGNOSIS — I25810 Atherosclerosis of coronary artery bypass graft(s) without angina pectoris: Secondary | ICD-10-CM

## 2014-01-10 MED ORDER — VALSARTAN 160 MG PO TABS: 160.0000 mg | ORAL_TABLET | Freq: Every day | ORAL | Status: DC

## 2014-01-10 NOTE — Assessment & Plan Note (Signed)
Continue aspirin and statin. 

## 2014-01-10 NOTE — Assessment & Plan Note (Signed)
Continue statin. 

## 2014-01-10 NOTE — Assessment & Plan Note (Signed)
Blood pressure controlled. Continue present medications. 

## 2014-01-10 NOTE — Patient Instructions (Signed)
Your physician has requested that you have a carotid duplex. This test is an ultrasound of the carotid arteries in your neck. It looks at blood flow through these arteries that supply the brain with blood. Allow one hour for this exam. There are no restrictions or special instructions.  Your physician wants you to follow-up in: One year with Dr.Crenshaw. You will receive a reminder letter in the mail two months in advance. If you don't receive a letter, please call our office to schedule the follow-up appointment.

## 2014-01-10 NOTE — Assessment & Plan Note (Signed)
Continue aspirin and statin. Schedule followup carotid Dopplers. 

## 2014-01-16 ENCOUNTER — Ambulatory Visit (HOSPITAL_COMMUNITY)
Admission: RE | Admit: 2014-01-16 | Discharge: 2014-01-16 | Disposition: A | Discharge status: Home / Self Care | Payer: Medicare Other | Source: Ambulatory Visit | Attending: Cardiology | Admitting: Cardiology

## 2014-01-16 DIAGNOSIS — I25810 Atherosclerosis of coronary artery bypass graft(s) without angina pectoris: Secondary | ICD-10-CM | POA: Diagnosis not present

## 2014-01-16 NOTE — Progress Notes (Signed)
Carotid Duplex Completed. °Belinda Day,RVT °

## 2014-01-24 ENCOUNTER — Other Ambulatory Visit: Payer: Self-pay

## 2014-01-24 ENCOUNTER — Telehealth: Payer: Self-pay | Admitting: *Deleted

## 2014-01-24 MED ORDER — ATORVASTATIN CALCIUM 80 MG PO TABS
80.0000 mg | ORAL_TABLET | Freq: Every day | ORAL | Status: DC
Start: 2014-01-24 — End: 2015-01-17

## 2014-01-24 NOTE — Telephone Encounter (Signed)
Refill sent for atorvastatin. 

## 2014-01-24 NOTE — Telephone Encounter (Signed)
Patient is requesting a refill for ALPRAZolam (XANAX) 0.25 MG tablet  Last refill: 12/24/2013 # 30, 0 refills Last OV: 08/13/2013

## 2014-01-28 MED ORDER — ALPRAZOLAM 0.25 MG PO TABS: ORAL_TABLET | ORAL | Status: DC

## 2014-01-28 NOTE — Telephone Encounter (Signed)
Rx called to pharmacist as below.

## 2014-01-28 NOTE — Telephone Encounter (Signed)
Ok to send 30 tabs zero refills. 

## 2014-01-30 ENCOUNTER — Encounter: Payer: Self-pay | Admitting: Family

## 2014-01-30 ENCOUNTER — Ambulatory Visit (INDEPENDENT_AMBULATORY_CARE_PROVIDER_SITE_OTHER): Payer: Medicare Other | Admitting: Family

## 2014-01-30 VITALS — BP 118/63 | HR 63 | Temp 98.3°F | Resp 16 | Ht 62.0 in | Wt 138.6 lb

## 2014-01-30 DIAGNOSIS — E785 Hyperlipidemia, unspecified: Secondary | ICD-10-CM

## 2014-01-30 DIAGNOSIS — J479 Bronchiectasis, uncomplicated: Secondary | ICD-10-CM

## 2014-01-30 DIAGNOSIS — G4700 Insomnia, unspecified: Secondary | ICD-10-CM

## 2014-01-30 DIAGNOSIS — I10 Essential (primary) hypertension: Secondary | ICD-10-CM

## 2014-01-30 DIAGNOSIS — E039 Hypothyroidism, unspecified: Secondary | ICD-10-CM

## 2014-01-30 LAB — BASIC METABOLIC PANEL
BUN: 14 mg/dL (ref 6–23)
CO2: 25 meq/L (ref 19–32)
CREATININE: 0.9 mg/dL (ref 0.4–1.2)
Calcium: 8.9 mg/dL (ref 8.4–10.5)
Chloride: 105 mEq/L (ref 96–112)
GFR: 64.8 mL/min (ref >60.00)
Glucose, Bld: 99 mg/dL (ref 70–99)
Potassium: 3.7 mEq/L (ref 3.5–5.1)
SODIUM: 137 meq/L (ref 135–145)

## 2014-01-30 LAB — TSH: TSH: 0.4 u[IU]/mL (ref 0.35–4.50)

## 2014-01-30 NOTE — Progress Notes (Signed)
Subjective:    Patient ID: Belinda Day, female    DOB: [birth year], 69 y.o.   MRN: [id number]  HPI  Belinda Day is a 69 yr old female who presents today for follow up of multiple medical problems:  1) HTN-  Patient is currently maintained on the following medications for blood pressure: bystolic, diovan Patient reports good compliance with blood pressure medications. Patient denies chest pain, shortness of breath or swelling. Last 3 blood pressure readings in our office are as follows: BP Readings from Last 3 Encounters:  01/30/14 118/63  01/10/14 118/74  08/14/13 130/72   2) Hyperlipidemia-  Patient is currently maintained on the following medication for hyperlipidemia: atorvastatin. Last lipid panel as follows:   Lab Results  Component Value Date   CHOL 149 08/13/2013   HDL 64 08/13/2013   LDLCALC 66 08/13/2013   TRIG 96 08/13/2013   CHOLHDL 2.3 08/13/2013   Patient denies myalgia. Patient reports good compliance with low fat/low cholesterol diet.   3) Hypothyroid- on synthroid.  Reports dry skin, weight gain and low energy x 6 months.   Wt Readings from Last 3 Encounters:  01/30/14 138 lb 9.6 oz (62.869 kg)  01/10/14 135 lb (61.236 kg)  08/14/13 137 lb (62.143 kg)   4) insomnia- reports good improvement with alprazolam.   Review of Systems    see HPI  Past Medical History  Diagnosis Date  . Bronchiectasis     oxygen at night   . MAI (mycobacterium avium-intracellulare)   . CAD (coronary artery disease)   . Hyperlipidemia   . HTN (hypertension)   . GERD (gastroesophageal reflux disease)   . History of shingles 04/2012  . Hypothyroidism   . Neuritis of upper extremity     History   Social History  . Marital Status: Single    Spouse Name: N/A    Number of Children: N/A  . Years of Education: N/A   Occupational History  . Not on file.   Social History Main Topics  . Smoking status: Former Smoker -- 1.00 packs/day for 20 years    Types: Cigarettes      Quit date: 01/25/1970  . Smokeless tobacco: Never Used  . Alcohol Use: No  . Drug Use: No  . Sexual Activity: Not on file   Other Topics Concern  . Not on file   Social History Narrative    Past Surgical History  Procedure Laterality Date  . Coronary artery bypass graft  2002    Family History  Problem Relation Age of Onset  . Cancer Mother     colon  . Hyperlipidemia Mother   . Hypertension Mother   . Heart disease Mother   . Stroke Mother   . Diabetes Mother   . Cancer Father     colon  . Arthritis Father   . Hyperlipidemia Father   . Hypertension Father   . Heart disease Father   . Stroke Father   . Atopy Neg Hx     Allergies  Allergen Reactions  . Ciprofloxacin Other (See Comments)    Body aches  . Codeine Nausea Only    REACTION: nausea  . Erythromycin Nausea Only    REACTION: nausea  . Levofloxacin Other (See Comments)    REACTION: aches    Current Outpatient Prescriptions on File Prior to Visit  Medication Sig Dispense Refill  . acetaminophen (TYLENOL) 650 MG CR tablet every 8 (eight) hours as needed for pain.     Marland Kitchen  albuterol (PROAIR HFA) 108 (90 BASE) MCG/ACT inhaler Inhale 2 puffs into the lungs every 6 (six) hours as needed for wheezing or shortness of breath. 1 Inhaler 3  . ALPRAZolam (XANAX) 0.25 MG tablet take 1 tablet by mouth at bedtime if needed (Patient taking differently: Take 0.25 mg by mouth at bedtime. take 1 tablet by mouth at bedtime if needed) 30 tablet 0  . amitriptyline (ELAVIL) 50 MG tablet take 1 tablet by mouth at bedtime 90 tablet 0  . aspirin 81 MG tablet Take 81 mg by mouth daily.     Marland Kitchen atorvastatin (LIPITOR) 80 MG tablet Take 1 tablet (80 mg total) by mouth daily. 90 tablet 3  . BYSTOLIC 5 MG tablet take 1 tablet by mouth once daily 90 tablet 3  . LINZESS 290 MCG CAPS capsule Take 1 capsule by mouth daily as needed.     . meclizine (ANTIVERT) 25 MG tablet Take 1 tablet (25 mg total) by mouth 3 (three) times daily as needed  for dizziness or nausea. 30 tablet 3  . Multiple Vitamin (MULTIVITAMIN) tablet Take 1 tablet by mouth daily.     Marland Kitchen sulfamethoxazole-trimethoprim (BACTRIM DS) 800-160 MG per tablet Take 1 tablet by mouth 2 (two) times daily. (Patient taking differently: Take 1 tablet by mouth 2 (two) times daily as needed. ) 20 tablet 11  . SYMBICORT 160-4.5 MCG/ACT inhaler inhale 2 puffs by mouth twice a day 10.2 g 11  . SYNTHROID 75 MCG tablet Take 1 tablet (75 mcg total) by mouth daily before breakfast. 90 tablet 1  . valsartan (DIOVAN) 160 MG tablet Take 1 tablet (160 mg total) by mouth daily. 90 tablet 3   No current facility-administered medications on file prior to visit.    BP 118/63 mmHg  Pulse 63  Temp(Src) 98.3 F (36.8 C) (Oral)  Resp 16  Ht 5\' 2"  (1.575 m)  Wt 138 lb 9.6 oz (62.869 kg)  BMI 25.34 kg/m2  SpO2 98%    Objective:   Physical Exam  Constitutional: She is oriented to person, place, and time. She appears well-developed and well-nourished. No distress.  HENT:  Head: Normocephalic and atraumatic.  Cardiovascular: Normal rate and regular rhythm.   No murmur heard. Pulmonary/Chest: Effort normal. No respiratory distress. She has wheezes. She has no rales. She exhibits no tenderness.  Musculoskeletal: She exhibits no edema.  Neurological: She is alert and oriented to person, place, and time.  Psychiatric: She has a normal mood and affect. Her behavior is normal. Judgment and thought content normal.          Assessment & Plan:

## 2014-01-30 NOTE — Progress Notes (Signed)
Pre visit review using our clinic review tool, if applicable. No additional management support is needed unless otherwise documented below in the visit note. 

## 2014-01-30 NOTE — Patient Instructions (Addendum)
Please complete your lab work prior to leaving. Increase symbicort to 2 puffs twice daily. Follow up in 6 months.

## 2014-01-31 ENCOUNTER — Encounter: Payer: Self-pay | Admitting: Cardiology

## 2014-01-31 ENCOUNTER — Telehealth: Payer: Self-pay | Admitting: Family

## 2014-01-31 DIAGNOSIS — E039 Hypothyroidism, unspecified: Secondary | ICD-10-CM

## 2014-01-31 MED ORDER — SYNTHROID 75 MCG PO TABS: ORAL_TABLET | ORAL | Status: DC

## 2014-01-31 NOTE — Telephone Encounter (Signed)
Returned your call . Please call .Marland Kitchen Thanks

## 2014-01-31 NOTE — Telephone Encounter (Signed)
This encounter was created in error - please disregard.

## 2014-01-31 NOTE — Telephone Encounter (Signed)
Thyroid dose should be changed slightly based on lab results. Continue 34mcg once daily 6 days a week, but one day a week only take 1/2 tab. Repeat TSH in 6 weeks.

## 2014-02-01 NOTE — Assessment & Plan Note (Signed)
Lab Results  Component Value Date   TSH 0.40 01/30/2014   TSH low normal, will decrease synthroid slightly but taking 1/2 tab by mouth once weekly.

## 2014-02-01 NOTE — Assessment & Plan Note (Signed)
+   wheezing today- but asymptomatic. Advised pt to increase symbicort to bid (was only taking once daily).

## 2014-02-01 NOTE — Assessment & Plan Note (Signed)
Improved with use of prn alprazolam.

## 2014-02-01 NOTE — Assessment & Plan Note (Signed)
BP stable on current meds, continue same. Obtain follow up bmet.

## 2014-02-01 NOTE — Assessment & Plan Note (Signed)
Lipids at goal. Continue statin.  

## 2014-02-01 NOTE — Telephone Encounter (Signed)
Discussed with the patient and she voiced understanding, she has agreed to adjust the Levothyroxine 75 mcg 1 daily for 6 days and 1/2 on the 7th day. Lab Apt has been scheduled to repeat TSH on 03/15/14 @ 9.     KP

## 2014-02-12 ENCOUNTER — Ambulatory Visit: Payer: Medicare Other | Admitting: Family

## 2014-02-20 ENCOUNTER — Ambulatory Visit (INDEPENDENT_AMBULATORY_CARE_PROVIDER_SITE_OTHER): Payer: Medicare Other | Admitting: Internal Medicine

## 2014-02-20 ENCOUNTER — Encounter: Payer: Self-pay | Admitting: Internal Medicine

## 2014-02-20 ENCOUNTER — Ambulatory Visit (INDEPENDENT_AMBULATORY_CARE_PROVIDER_SITE_OTHER)
Admission: RE | Admit: 2014-02-20 | Discharge: 2014-02-20 | Disposition: A | Discharge status: Home / Self Care | Payer: Medicare Other | Source: Ambulatory Visit | Attending: Internal Medicine | Admitting: Internal Medicine

## 2014-02-20 VITALS — BP 112/60 | HR 64 | Temp 99.2°F | Ht 61.0 in | Wt 137.0 lb

## 2014-02-20 DIAGNOSIS — J471 Bronchiectasis with (acute) exacerbation: Secondary | ICD-10-CM

## 2014-02-20 DIAGNOSIS — R042 Hemoptysis: Secondary | ICD-10-CM

## 2014-02-20 DIAGNOSIS — K219 Gastro-esophageal reflux disease without esophagitis: Secondary | ICD-10-CM

## 2014-02-20 MED ORDER — BUDESONIDE-FORMOTEROL FUMARATE 160-4.5 MCG/ACT IN AERO
2.0000 | INHALATION_SPRAY | Freq: Two times a day (BID) | RESPIRATORY_TRACT | Status: DC
Start: 2014-02-20 — End: 2014-10-15

## 2014-02-20 NOTE — Patient Instructions (Addendum)
Try prilosec 20mg   Take 30-60 min before first meal of the day and Pepcid 20 mg one bedtime until return  GERD (REFLUX)  is an extremely common cause of respiratory symptoms just like yours , many times with no obvious heartburn at all.    It can be treated with medication, but also with lifestyle changes including avoidance of late meals, excessive alcohol, smoking cessation, and avoid fatty foods, chocolate, peppermint, colas, red wine, and acidic juices such as orange juice.  NO MINT OR MENTHOL PRODUCTS SO NO COUGH DROPS  USE SUGARLESS CANDY INSTEAD (Jolley ranchers or Stover's or Life Savers) or even ice chips will also do - the key is to swallow to prevent all throat clearing. NO OIL BASED VITAMINS - use powdered substitutes.    Please remember to go to the x-ray department downstairs for your tests - we will call you with the results when they are available.     Please schedule a follow up visit in 3 months but call sooner if needed

## 2014-02-20 NOTE — Progress Notes (Signed)
Quick Note:  Spoke with pt and notified of results per Dr. Wert. Pt verbalized understanding and denied any questions.  ______ 

## 2014-02-20 NOTE — Progress Notes (Signed)
Subjective:     Patient ID: Belinda Day, female   DOB: 12-23-1945    MRN: [id number]   Brief patient profile:  69 yowf quit smoking 1972 with documented right middle lobe  syndrome and evidence of bronchiectasis by CT scan in March 2002,    History of Present Illness  08/08/07 FOB with classic cobblestoning and MAI on culture.   08/15/07 given Levaquin x 10 days with resolution bloody mucus, but "felt she had flu the whole time" with aches, feverish   August 29, 2007 ov: first post bronch co still coughing up mucus clear and initiate rx with symbicort/ [facility]   October 19, 2007 ov no cough , sob, feeling great but no improvement on cxr   December 07, 2007 ov feeling great, minimal am cough not productive. No sob.   Opth eval, labs ok 10/09   September 06, 2008 ov overall better over the last year, less tendency to exac on zmax and ethambutol. rec complete another year > satisfied improved 90% and stopped zmax and eth 08/2009   November 19, 2009 ov Cough- retrned after d/c abx in July- prod with clear sputum. breathing ok and no nocturnal co's or early am exac. Levaquin x 750 mg x 5 day cycles for worsening cough/ congestion or discolored sputum  Work on perfecting inhaler technique: and continue symbicort one twice because too shaky on 2bid   March 31, 2010 ov cough worse levaquin not able to tol and didn't help like zmax and ethambutol did and wants to restart. mucus minimally discolored, worse in am, no sign sob. rec 1) Cipro 750 one twice daily for 7 days for cough flare > tol well 2) Call if not better to restart zmax and ehtambutal.  07/16/2010 ov/Belinda Day better,  No cough or sob.  rec Please schedule a follow up visit in 3 months but call sooner if needed with CXR and PFT's on return If cipro x 7 days not adequate call for earlier follow up    10/14/2010 f/u ov/Belinda Day maintained on symbicort 80 2bid limited to walking nl pace but can "go anywhere" and able to kayak,  throat clearing day more night with no am exac but cough def worse off MAI rx rec Work on inhaler technique:  Stop metaprolol 50 mg and replace it with bystolic 5mg  one twice daily x 2 weeks to see if it makes any difference in cough or breathing and if so let us know, if not resume the metaprolol     05/04/2011 f/u ov/Belinda Day cc cough much better p rx with bactrim, no more purulent sputum, no hemoptysis, no limiting sob. rec Ok to use bactrim x 10 days for flare of nasty mucus Work on inhaler technique  07/30/2011 f/u ov/Belinda Day cc Patient had a respiratory infection x 2 weeks in June, would like to discuss rescue inhaler > sob and cough back to nl x hoarseness . No limiting doe. rec mucinex dm up 1200mg  every 12 hours as needed for cough Try prilosec 20mg   Take 30-60 min before first meal of the day and Pepcid 20 mg one bedtime until cough and hoarseness  are both  is completely gone for at least a week without the need for cough suppression Only use your xopenex hfa as a rescue medication  Please schedule a follow up visit in 3 months but call sooner if needed with CXR and PFT's  10/29/2011 f/u ov/Belinda Day cc breathing better, every 3 months or so  flare of purulent sputum/ fever  Rx bactrim / symbicort 160 2 bid maint  and only only need xopenex 3 times last 3 months with excellent ex tolerance.  rec No change rx   12/12/2012 f/u ov/Belinda Day re: bronchiectasis c a/f obst on symbicort 160 2 bid Chief Complaint  Patient presents with  . Follow-up    Pt states doing well and denies any new co's today   Has bactrim ds to take prn did one course since last ov Not limited by breathing from any desired activiites, no need for saba  Min am mucus mucus better p coffee. rec Keep your xopenex with you when you go out in case you need to use it as needed   08/15/2013 f/u ov/Belinda Day re:  Bronchiectasis with airflow obst  Chief Complaint  Patient presents with  . Followup with PFT    Pt states breathing has  been worse over the past several months- relates to humid/hot weather. She c/o increased cough for the past month- prod with pale yellow sputum.      rarely if ever using saba, has not used bactrim in sev months as Not limited by breathing from desired activities  - cough is worse in am's but min sputum vol/discoloration  rec Ok to cancel your oxygen effective today  Prevnar -13 today  No change symbicort 160 Take 2 puffs first thing in am and then another 2 puffs about 12 hours later.  Only use your albuterol(xopenex) as a rescue medication    02/20/2014 f/u ov/Belinda Day re: bronchiectasis/ acute flare  Chief Complaint  Patient presents with  . Follow-up    Pt states that her cough is about the same as far as frequency, but started to cough up bloody sputum 02/19/14. Last dose of bactrim was taken in Nov 2015.    overt hb when coughing / no sob or cp/ not using any saba at all  Total amt of blood < 1 tsp per day/ really only streaky with variably purulent sputum but has not started her abx yet   No obvious daytime variabilty or assoc   chest tightness, subjective wheeze overt sinus or hb symptoms. No unusual exp hx or h/o childhood pna/ asthma or premature birth to her knowledge.   Sleeping ok without nocturnal  or early am exacerbation  of respiratory  c/o's or need for noct saba. Also denies any obvious fluctuation of symptoms with weather or environmental changes or other aggravating or alleviating factors except as outlined above      ROS  The following are not active complaints unless bolded sore throat, dysphagia, dental problems, itching, sneezing,  nasal congestion or excess/ purulent secretions, ear ache,   fever, chills, sweats, unintended wt loss, pleuritic or exertional cp, hemoptysis,  orthopnea pnd or leg swelling, presyncope, palpitations, heartburn, abdominal pain, anorexia, nausea, vomiting, diarrhea  or change in bowel or urinary habits, change in stools or urine,  dysuria,hematuria,  rash, arthralgias, visual complaints, headache, numbness weakness or ataxia or problems with walking or coordination,  change in mood/affect or memory.                  Past Medical History:  Bronchiectasis see CT SE 04/13/00  - HFA 75% November 19, 2009  MAI  - Rx Zmax and [facility] 08/29/07 > 08/2009  - Eye eval   10/[place]....................[place] try cycles of cipro April 02, 2010 > changed to bactrim 03/2011 HEALTH MAINTENANCE...........................[name] - Td 10/2007  -  Pneumovax 2005   and 10/29/2011 age 34, prevnar 08/16/2013  CAD  Hyperlipidemia  Hypertension  History of cough with ACE inhibition.  Gastroesophageal reflux disease             Objective:   Physical Exam   In general she is an extremely  pleasant ambulatory white female in no acute distress.   Wt 130 October 19, 2007>140 March 31, 2010 > 127 07/16/2010 > 10/14/2010  120 > 05/04/2011  117 > 07/30/2011  120 > 10/29/2011 118 > 130  05/01/2012 > 12/12/2012 132 >  08/14/13 137 >    02/20/2014  137  HEENT: nl dentition, turbinates, and orophanx. Nl external ear canals without cough reflex  Neck without JVD/Nodes/TM  Lungs  insp and  exp   rhonchi bilaterally with a few insp pops and sqeaks as well prior to am symbicort  RRR no s3 or murmur or increase in P2  Abd soft and benign with nl excursion in the supine position. No bruits or organomegaly  Ext warm without calf tenderness, cyanosis clubbing or edema         CXR PA and Lateral:   02/20/2014 :     I personally reviewed images and agree with radiology impression as follows:    1. Stable bibasilar pleural parenchymal thickening consistent with scarring. No acute infiltrate. 2. Prior CABG. Heart size normal.      Assessment:

## 2014-02-24 ENCOUNTER — Encounter: Payer: Self-pay | Admitting: Internal Medicine

## 2014-02-24 DIAGNOSIS — K219 Gastro-esophageal reflux disease without esophagitis: Secondary | ICD-10-CM | POA: Insufficient documentation

## 2014-02-24 DIAGNOSIS — R042 Hemoptysis: Secondary | ICD-10-CM | POA: Insufficient documentation

## 2014-02-24 NOTE — Assessment & Plan Note (Signed)
Minimal amount of actual bleeding assoc with mild flare of bronchiectasis with no acute change on cxr so rec hold asa until no blood and rx as bronchiectasis flare

## 2014-02-24 NOTE — Assessment & Plan Note (Signed)
Active with coughing. Explained the natural history of bronchiectasis flares and why it's necessary in patients at risk to treat GERD aggressively - at least  short term -   to reduce risk of evolving cyclical cough initially  triggered by epithelial injury and a heightened sensitivty to the effects of any upper airway irritants,  most importantly acid - related - then perpetuated by epithelial injury related to the cough itself as the upper airway collapses on itself.  That is, the more sensitive the epithelium becomes once it is damaged by the virus, the more the ensuing irritability> the more the cough, the more the secondary reflux (especially in those prone to reflux) the more the irritation of the sensitive mucosa and so on in a  Classic cyclical pattern.    See instructions for specific recommendations which were reviewed directly with the patient who was given a copy with highlighter outlining the key components.

## 2014-02-24 NOTE — Assessment & Plan Note (Addendum)
-   PFT's 10/14/2010  FEV1  1.25 (68%) and ratio 58% and DLCO 90%     - PFT's 10/29/2011  FEV1  1.33 (74%) and ratio 57 % and DLCO 93%    - PFTs 08/14/2013   FEV1  1.16 (60%) and ratio 61 with dlco 83%     - 02/20/2014 p extensive coaching HFA effectiveness =    90% but baseline < 75%  Really only mild  acute flare but with bleeding needs to go ahead and activate her action plan for flare    Each maintenance medication was reviewed in detail including most importantly the difference between maintenance and as needed and under what circumstances the prns are to be used.  Please see instructions for details which were reviewed in writing and the patient given a copy.

## 2014-02-25 ENCOUNTER — Other Ambulatory Visit: Payer: Self-pay | Admitting: Family

## 2014-02-26 NOTE — Telephone Encounter (Signed)
Belinda Day-- please advise re: direction and quantity?

## 2014-02-27 MED ORDER — ALPRAZOLAM 0.25 MG PO TABS: ORAL_TABLET | ORAL | Status: DC

## 2014-02-27 NOTE — Telephone Encounter (Signed)
See pended rx below please.

## 2014-02-27 NOTE — Telephone Encounter (Signed)
Rx called to [place] at [facility], #[phone] x no refills.

## 2014-03-01 ENCOUNTER — Ambulatory Visit: Payer: Medicare Other | Admitting: Internal Medicine

## 2014-03-15 ENCOUNTER — Encounter: Payer: Self-pay | Admitting: Family

## 2014-03-15 ENCOUNTER — Other Ambulatory Visit (INDEPENDENT_AMBULATORY_CARE_PROVIDER_SITE_OTHER): Payer: Medicare Other

## 2014-03-15 DIAGNOSIS — E039 Hypothyroidism, unspecified: Secondary | ICD-10-CM

## 2014-03-15 LAB — TSH: TSH: 1.21 u[IU]/mL (ref 0.35–4.50)

## 2014-03-15 NOTE — Telephone Encounter (Signed)
Lab order entered.

## 2014-03-15 NOTE — Addendum Note (Signed)
Addended by: Kelle Darting A on: 03/15/2014 09:01 AM   Modules accepted: Orders

## 2014-03-15 NOTE — Addendum Note (Signed)
Addended by: Harl Bowie on: 03/15/2014 10:38 AM   Modules accepted: Orders

## 2014-03-15 NOTE — Addendum Note (Signed)
Addended by: Harl Bowie on: 03/15/2014 10:37 AM   Modules accepted: Orders

## 2014-03-17 ENCOUNTER — Other Ambulatory Visit: Payer: Self-pay | Admitting: Family Medicine

## 2014-04-22 ENCOUNTER — Other Ambulatory Visit: Payer: Self-pay | Admitting: Cardiology

## 2014-04-25 ENCOUNTER — Telehealth: Payer: Self-pay | Admitting: Family

## 2014-04-26 MED ORDER — ALPRAZOLAM 0.25 MG PO TABS: ORAL_TABLET | ORAL | Status: DC

## 2014-04-26 NOTE — Telephone Encounter (Signed)
Last Alprazolam Rx 02/27/14. Rx printed and forwarded to Provider(Paz) for signature. Controlled substance contract printed and will be placed at the front desk for signature when she picks up Rx. Pt last seen 01/2014 and has f/u 07/2014 with PCP.

## 2014-05-02 ENCOUNTER — Encounter: Payer: Self-pay | Admitting: Family Medicine

## 2014-05-02 ENCOUNTER — Ambulatory Visit (INDEPENDENT_AMBULATORY_CARE_PROVIDER_SITE_OTHER): Payer: Medicare Other | Admitting: Family Medicine

## 2014-05-02 VITALS — BP 120/60 | HR 64 | Temp 98.5°F | Wt 130.0 lb

## 2014-05-02 DIAGNOSIS — E785 Hyperlipidemia, unspecified: Secondary | ICD-10-CM

## 2014-05-02 DIAGNOSIS — I2510 Atherosclerotic heart disease of native coronary artery without angina pectoris: Secondary | ICD-10-CM

## 2014-05-02 DIAGNOSIS — I10 Essential (primary) hypertension: Secondary | ICD-10-CM | POA: Diagnosis not present

## 2014-05-02 DIAGNOSIS — M797 Fibromyalgia: Secondary | ICD-10-CM | POA: Diagnosis not present

## 2014-05-02 DIAGNOSIS — K5900 Constipation, unspecified: Secondary | ICD-10-CM | POA: Insufficient documentation

## 2014-05-02 DIAGNOSIS — Z86010 Personal history of colonic polyps: Secondary | ICD-10-CM | POA: Insufficient documentation

## 2014-05-02 DIAGNOSIS — Z87891 Personal history of nicotine dependence: Secondary | ICD-10-CM

## 2014-05-02 DIAGNOSIS — R739 Hyperglycemia, unspecified: Secondary | ICD-10-CM

## 2014-05-02 DIAGNOSIS — E039 Hypothyroidism, unspecified: Secondary | ICD-10-CM

## 2014-05-02 MED ORDER — SYNTHROID 75 MCG PO TABS: ORAL_TABLET | ORAL | Status: DC

## 2014-05-02 NOTE — Assessment & Plan Note (Signed)
Asymptomatic continue aspirin and statin. Continue regular cardiology follow-up

## 2014-05-02 NOTE — Patient Instructions (Addendum)
Get colonoscopy records from Dr. Earlean Shawl and have them faxed to [place] at [phone]  Check your multivitamin and make sure it has Vit d 1000 IU international units.   Things look great today. No changes.   Let's check in July for a morning appointment. Schedule bloodwork visit a few days beforehand.

## 2014-05-02 NOTE — Assessment & Plan Note (Signed)
Controlled on atorvastatin 80 mg with LDL less than 70. Ordered fasting lipids to be drawn before follow-up visit

## 2014-05-02 NOTE — Assessment & Plan Note (Signed)
Continue, continue amitriptyline and as needed Tylenol,

## 2014-05-02 NOTE — Progress Notes (Signed)
Belinda Reddish, MD Phone: [phone]  Subjective:  Patient presents today to establish care with me as their new primary care provider. Patient was formerly a patient of Belinda Alar, NP. Transferring care as only 5 minutes from her home vs. 35 minutes. Chief complaint-noted.   CAD asymptomatic -Dr. Stanford Breed. CABG x3  2004. ASA, atorvastatin 80mg -compliant with each.  ROS- no chest pain, shortness of breath. Stairs without difficulty-also kayaks withotu issues.   Hyperlipidemia-controlled  Lab Results  Component Value Date   LDLCALC 66 08/13/2013   On statin: atorvastatin 80mg   Regular exercise: very active ROS- no chest pain or shortness of breath. No myalgias  Hypertension-controlled BP Readings from Last 3 Encounters:  05/02/14 120/60  02/20/14 112/60  01/30/14 118/63   Home BP monitoring-yes and typically around 110/60 Compliant with medications-yes without side effects ROS-Denies any CP, HA, SOB, blurry vision, LE edema.  Fibromyalgia-controlled Amitriptyline 50mg , tylenol as needed. Body aches and fatigue if very active but most of the time her symptoms are controlled, she previously had used Darvocet which she no longer has to use ROS - denies mental confusion, severe fatigue at baseline  The following were reviewed and entered/updated in epic: Past Medical History  Diagnosis Date  . Bronchiectasis     oxygen at night in the past  . MAI (mycobacterium avium-intracellulare)   . CAD (coronary artery disease)   . Hyperlipidemia   . HTN (hypertension)   . GERD (gastroesophageal reflux disease)   . History of shingles 04/2012  . Hypothyroidism   . Neuritis of upper extremity   . Diverticulitis 2014   Patient Active Problem List   Diagnosis Date Noted  . Osteopenia 08/14/2011    Priority: High  . CAD (coronary artery disease) s/p CABG 07/03/2007    Priority: High  . Insomnia 02/12/2013    Priority: Medium  . Nocturnal hypoxemia 12/12/2012    Priority:  Medium  . Fibromyalgia 12/28/2011    Priority: Medium  . Hypothyroidism 04/06/2010    Priority: Medium  . MAI PULMONARY DISEASES DUE TO MYCOBACTERIA 11/19/2009    Priority: Medium  . Hyperlipemia 07/03/2007    Priority: Medium  . Essential hypertension 07/03/2007    Priority: Medium  . Bronchiectasis without acute exacerbation but chronic airflow obst on pfts 07/03/2007    Priority: Medium  . Constipation 05/02/2014    Priority: Low  . History of colonic polyps 05/02/2014    Priority: Low  . GERD (gastroesophageal reflux disease) 02/24/2014    Priority: Low  . Hemoptysis 02/24/2014    Priority: Low  . Benign paroxysmal positional vertigo 04/18/2013    Priority: Low  . Diverticulitis 12/21/2012    Priority: Low   Past Surgical History  Procedure Laterality Date  . Triple bypass  2004    x3 CABG    Family History  Problem Relation Age of Onset  . Colon cancer Mother     and father  . Hyperlipidemia Mother   . Hypertension Mother   . Heart disease Mother   . Stroke Mother   . Diabetes Mother   . Colon cancer Father   . Arthritis Father   . Hyperlipidemia Father   . Hypertension Father   . Heart disease Father   . Stroke Father   . Atopy Neg Hx     Medications- reviewed and updated Current Outpatient Prescriptions  Medication Sig Dispense Refill  . amitriptyline (ELAVIL) 50 MG tablet take 1 tablet by mouth at bedtime 90 tablet 1  . aspirin  81 MG tablet Take 81 mg by mouth daily.     Marland Kitchen atorvastatin (LIPITOR) 80 MG tablet Take 1 tablet (80 mg total) by mouth daily. 90 tablet 3  . budesonide-formoterol (SYMBICORT) 160-4.5 MCG/ACT inhaler Inhale 2 puffs into the lungs 2 (two) times daily. 10.2 g 11  . BYSTOLIC 5 MG tablet take 1 tablet by mouth once daily 90 tablet 3  . meclizine (ANTIVERT) 25 MG tablet Take 1 tablet (25 mg total) by mouth 3 (three) times daily as needed for dizziness or nausea. 30 tablet 3  . Multiple Vitamin (MULTIVITAMIN) tablet Take 1 tablet by  mouth daily.     Marland Kitchen SYNTHROID 75 MCG tablet One tab by mouth once daily 6 days a week. 1/2 tab on 7th day 30 tablet 2  . valsartan (DIOVAN) 160 MG tablet Take 1 tablet (160 mg total) by mouth daily. 90 tablet 3  . acetaminophen (TYLENOL) 650 MG CR tablet every 8 (eight) hours as needed for pain.     Marland Kitchen albuterol (PROAIR HFA) 108 (90 BASE) MCG/ACT inhaler Inhale 2 puffs into the lungs every 6 (six) hours as needed for wheezing or shortness of breath. (Patient not taking: Reported on 05/02/2014) 1 Inhaler 3  . ALPRAZolam (XANAX) 0.25 MG tablet Take 1-2 tablet by mouth at bedtime as needed for sleep (Patient not taking: Reported on 05/02/2014) 60 tablet 0  . LINZESS 290 MCG CAPS capsule Take 1 capsule by mouth daily as needed.      No current facility-administered medications for this visit.    Allergies-reviewed and updated Allergies  Allergen Reactions  . Ciprofloxacin Other (See Comments)    Body aches  . Codeine Nausea Only    REACTION: nausea  . Erythromycin Nausea Only    REACTION: nausea  . Levofloxacin Other (See Comments)    REACTION: aches    History   Social History  . Marital Status: Single    Spouse Name: N/A  . Number of Children: N/A  . Years of Education: N/A   Social History Main Topics  . Smoking status: Former Smoker -- 1.00 packs/day for 20 years    Types: Cigarettes    Quit date: 01/25/1970  . Smokeless tobacco: Never Used  . Alcohol Use: No  . Drug Use: No  . Sexual Activity: Not on file   Other Topics Concern  . None   Social History Narrative   Family: Single never married, no children, cat and rehabs turtles and tortoise   Went to [employer] in [employer] and social work, some business courses at [employer], EMT for 6 years.    LIves alone. Completely independent.    Lives in retirement community.       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 Oropharynx normal.  CV: RRR no murmurs rubs or gallops Lungs: CTAB no crackles, wheeze, rhonchi Abdomen: soft/nontender/nondistended/normal bowel sounds. No rebound or guarding.  Ext: no edema Skin: warm, dry, no rash Neuro: grossly normal, moves all extremities, PERRLA   Assessment/Plan:  CAD (coronary artery disease) s/p CABG Asymptomatic continue aspirin and statin. Continue regular cardiology follow-up   Hyperlipemia Controlled on atorvastatin 80 mg with LDL less than 70. Ordered fasting lipids to be  drawn before follow-up visit   Essential hypertension Controlled and continue Bystolic 5mg , valsartan 160mg    Fibromyalgia Continue, continue amitriptyline and as needed Tylenol,    Return precautions advised. Follow-up in July for labs to keep her on her 6 month schedule which is important to her. Dexa order at follow up.   Orders Placed This Encounter  Procedures  . CBC    [place]    Standing Status: Future     Number of Occurrences:      Standing Expiration Date: 05/02/2015  . Comprehensive metabolic panel    [place]    Standing Status: Future     Number of Occurrences:      Standing Expiration Date: 05/02/2015    Order Specific Question:  Has the patient fasted?    Answer:  No  . Lipid panel    [place]    Standing Status: Future     Number of Occurrences:      Standing Expiration Date: 05/02/2015    Order Specific Question:  Has the patient fasted?    Answer:  No  . TSH    [place]    Standing Status: Future     Number of Occurrences:      Standing Expiration Date: 05/02/2015  . Hemoglobin A1c    [place]    Standing Status: Future     Number of Occurrences:      Standing Expiration Date: 05/02/2015  . POCT urinalysis dipstick    Standing Status: Future     Number of Occurrences:       Standing Expiration Date: 05/02/2015   Requests 90 day supply Meds ordered this encounter  Medications  . SYNTHROID 75 MCG tablet    Sig: One tab by mouth once daily 6 days a week. 1/2 tab on 7th day    Dispense:  90 tablet    Refill:  3

## 2014-05-02 NOTE — Assessment & Plan Note (Signed)
Controlled and continue Bystolic 5mg , valsartan 160mg 

## 2014-05-15 DIAGNOSIS — L821 Other seborrheic keratosis: Secondary | ICD-10-CM | POA: Diagnosis not present

## 2014-05-15 DIAGNOSIS — L300 Nummular dermatitis: Secondary | ICD-10-CM | POA: Diagnosis not present

## 2014-05-15 DIAGNOSIS — L814 Other melanin hyperpigmentation: Secondary | ICD-10-CM | POA: Diagnosis not present

## 2014-05-15 DIAGNOSIS — Z85828 Personal history of other malignant neoplasm of skin: Secondary | ICD-10-CM | POA: Diagnosis not present

## 2014-05-15 DIAGNOSIS — D225 Melanocytic nevi of trunk: Secondary | ICD-10-CM | POA: Diagnosis not present

## 2014-05-31 ENCOUNTER — Ambulatory Visit (INDEPENDENT_AMBULATORY_CARE_PROVIDER_SITE_OTHER): Payer: Medicare Other | Admitting: Internal Medicine

## 2014-05-31 ENCOUNTER — Encounter: Payer: Self-pay | Admitting: Internal Medicine

## 2014-05-31 VITALS — BP 132/74 | HR 68 | Ht 62.0 in | Wt 134.0 lb

## 2014-05-31 DIAGNOSIS — A310 Pulmonary mycobacterial infection: Secondary | ICD-10-CM

## 2014-05-31 DIAGNOSIS — J479 Bronchiectasis, uncomplicated: Secondary | ICD-10-CM | POA: Diagnosis not present

## 2014-05-31 MED ORDER — AZITHROMYCIN 250 MG PO TABS: ORAL_TABLET | ORAL | Status: DC

## 2014-05-31 MED ORDER — ETHAMBUTOL HCL 400 MG PO TABS: ORAL_TABLET | ORAL | Status: DC

## 2014-05-31 NOTE — Progress Notes (Signed)
Subjective:     Patient ID: Belinda Day, female   DOB: September 25, 1945    MRN: [id number]   Brief patient profile:  69  yowf quit smoking 1972 with documented right middle lobe  syndrome and evidence of bronchiectasis by CT scan in March 2002   History of Present Illness  08/08/07 FOB with classic cobblestoning and MAI on culture.   08/15/07 given Levaquin x 10 days with resolution bloody mucus, but "felt she had flu the whole time" with aches, feverish   August 29, 2007 ov: first post bronch co still coughing up mucus clear and initiate rx with symbicort/ [facility]   October 19, 2007 ov no cough , sob, feeling great but no improvement on cxr   December 07, 2007 ov feeling great, minimal am cough not productive. No sob.   Opth eval, labs ok 10/2007   September 06, 2008 ov overall better over the last year, less tendency to exac on zmax and ethambutol. rec complete another year > satisfied improved 90% and stopped zmax and eth 08/2009    08/15/2013 f/u ov/Cavin Longman re:  Bronchiectasis with airflow obst  Chief Complaint  Patient presents with  . Followup with PFT    Pt states breathing has been worse over the past several months- relates to humid/hot weather. She c/o increased cough for the past month- prod with pale yellow sputum.      rarely if ever using saba, has not used bactrim in sev months as Not limited by breathing from desired activities  - cough is worse in am's but min sputum vol/discoloration  rec Ok to cancel your oxygen effective today  Prevnar -71 today  No change symbicort 160 Take 2 puffs first thing in am and then another 2 puffs about 12 hours later.  Only use your albuterol(xopenex) as a rescue medication    02/20/2014 f/u ov/Avante Carneiro re: bronchiectasis/ acute flare  Chief Complaint  Patient presents with  . Follow-up    Pt states that her cough is about the same as far as frequency, but started to cough up bloody sputum 02/19/14. Last dose of bactrim was  taken in Nov 2015.    overt hb when coughing / no sob or cp/ not using any saba at all  Total amt of blood < 1 tsp per day/ really only streaky with variably purulent sputum but has not started her abx yet  rec Try prilosec 20mg   Take 30-60 min before first meal of the day and Pepcid 20 mg one bedtime until return GERD  Diet        05/31/2014 f/u ov/Waseem Suess re: bronchiectasis/chronic airflow obst/  requesting to restart maint zmax/eth Chief Complaint  Patient presents with  . Follow-up    cough still there; SOB at times    Cough worse esp in am > yellow mucus x sev tsp x months  Rare saba need on symbicort 160 2bid  No obvious daytime variabilty or assoc sob    chest tightness, subjective wheeze overt sinus or hb symptoms. No unusual exp hx or h/o childhood pna/ asthma or premature birth to her knowledge.   Sleeping ok without nocturnal  or early am exacerbation  of respiratory  c/o's or need for noct saba. Also denies any obvious fluctuation of symptoms with weather or environmental changes or other aggravating or alleviating factors except as outlined above      ROS  The following are not active complaints unless bolded sore throat, dysphagia,  dental problems, itching, sneezing,  nasal congestion or excess/ purulent secretions, ear ache,   fever, chills, sweats, unintended wt loss, pleuritic or exertional cp, hemoptysis,  orthopnea pnd or leg swelling, presyncope, palpitations, heartburn, abdominal pain, anorexia, nausea, vomiting, diarrhea  or change in bowel or urinary habits, change in stools or urine, dysuria,hematuria,  rash, arthralgias, visual complaints, headache, numbness weakness or ataxia or problems with walking or coordination,  change in mood/affect or memory.                  Past Medical History:  Bronchiectasis see CT SE 04/13/00  - HFA 75% November 19, 2009  MAI  - Rx Zmax and [facility] 08/29/07 > 08/2009 restarted empirically 05/31/14 >>>  - Eye eval    10[place] try cycles of cipro April 02, 2010 > changed to bactrim 03/2011 HEALTH MAINTENANCE...........................[name] - T[place] 10/2007  - Pneumovax 2005   and 10/29/2011 age 69, prevnar 08/16/2013  CAD  Hyperlipidemia  Hypertension  History of cough with ACE inhibition.  Gastroesophageal reflux disease             Objective:   Physical Exam   In general she is an extremely  pleasant ambulatory white female in no acute distress.   Wt 130 October 19, 2007>140 March 31, 2010 > 127 07/16/2010 > 10/14/2010  120 > 05/04/2011  117 > 07/30/2011  120 > 10/29/2011 118 > 130  05/01/2012 > 12/12/2012 132 >  08/14/13 137 >    02/20/2014  137 >  05/31/2014 134   HEENT: nl dentition, turbinates, and orophanx. Nl external ear canals without cough reflex  Neck without JVD/Nodes/TM  Lungs  insp and  exp  rhonchi bilaterally with a few insp pops and sqeaks  RRR no s3 or murmur or increase in P2  Abd soft and benign with nl excursion in the supine position. No bruits or organomegaly  Ext warm without calf tenderness, cyanosis clubbing or edema         CXR PA and Lateral:   02/20/2014 :     I personally reviewed images and agree with radiology impression as follows:    1. Stable bibasilar pleural parenchymal thickening consistent with scarring. No acute infiltrate. 2. Prior CABG. Heart size normal.      Assessment:

## 2014-05-31 NOTE — Patient Instructions (Addendum)
Zithromax 250 mg daily and ethambutol is 400mg  twice x 3 months   See your eye doctor as soon as possible to let him know you have started ethambutol and stop it if having any vision problems   Please schedule a follow up visit in 3 months but call sooner if needed with cxr

## 2014-06-01 ENCOUNTER — Encounter: Payer: Self-pay | Admitting: Internal Medicine

## 2014-06-01 NOTE — Assessment & Plan Note (Signed)
-   PFT's 10/14/2010  FEV1  1.25 (68%) and ratio 58% and DLCO 90%     - PFT's 10/29/2011  FEV1  1.33 (74%) and ratio 57 % and DLCO 93%    - PFTs 08/14/2013   FEV1  1.16 (60%) and ratio 61 with dlco 83%     - 02/20/2014 p extensive coaching HFA effectiveness =    90%  Not limited by breathing/ no need for saba so relatively well compensated on present rx  Each maintenance medication was reviewed in detail including most importantly the difference between maintenance and as needed and under what circumstances the prns are to be used.  Please see instructions for details which were reviewed in writing and the patient given a copy.

## 2014-06-01 NOTE — Assessment & Plan Note (Signed)
-  08/08/07 FOB with classic cobblestoning and MAI on culture.  - Rx 08/2007   To 08/2009 with ETH/Zmax - restarted empirically 05/31/14    I had an extended discussion with the patient reviewing all relevant studies completed to date and  lasting 15 to 20 minutes of a 25 minute visit on the following ongoing concerns:  1) pt convinced has never felt as good as she did while maintained on MAI rx and reasonable to start it back and see how her symptoms resopond rather than fob first though I warned her re risk / side effects of meds and esp need for opth surveillance   2) See instructions for specific recommendations which were reviewed directly with the patient who was given a copy with highlighter outlining the key components.

## 2014-06-18 ENCOUNTER — Other Ambulatory Visit: Payer: Self-pay | Admitting: Family Medicine

## 2014-06-18 DIAGNOSIS — Z79899 Other long term (current) drug therapy: Secondary | ICD-10-CM | POA: Diagnosis not present

## 2014-06-18 MED ORDER — ALPRAZOLAM 0.25 MG PO TABS: ORAL_TABLET | ORAL | Status: DC

## 2014-07-08 ENCOUNTER — Ambulatory Visit: Payer: Medicare Other | Admitting: Family Medicine

## 2014-07-31 ENCOUNTER — Other Ambulatory Visit (INDEPENDENT_AMBULATORY_CARE_PROVIDER_SITE_OTHER): Payer: Medicare Other

## 2014-07-31 DIAGNOSIS — I10 Essential (primary) hypertension: Secondary | ICD-10-CM

## 2014-07-31 DIAGNOSIS — E785 Hyperlipidemia, unspecified: Secondary | ICD-10-CM | POA: Diagnosis not present

## 2014-07-31 DIAGNOSIS — Z87891 Personal history of nicotine dependence: Secondary | ICD-10-CM

## 2014-07-31 DIAGNOSIS — R739 Hyperglycemia, unspecified: Secondary | ICD-10-CM

## 2014-07-31 LAB — POCT URINALYSIS DIPSTICK
Bilirubin, UA: NEGATIVE
Glucose, UA: NEGATIVE
Ketones, UA: NEGATIVE
NITRITE UA: NEGATIVE
PH UA: 5.5
Protein, UA: NEGATIVE
RBC UA: NEGATIVE
SPEC GRAV UA: 1.01
UROBILINOGEN UA: 0.2

## 2014-07-31 LAB — COMPREHENSIVE METABOLIC PANEL
ALT: 22 U/L (ref 0–35)
AST: 28 U/L (ref 0–37)
Albumin: 3.8 g/dL (ref 3.5–5.2)
Alkaline Phosphatase: 82 U/L (ref 39–117)
BUN: 15 mg/dL (ref 6–23)
CALCIUM: 9 mg/dL (ref 8.4–10.5)
CO2: 27 mEq/L (ref 19–32)
CREATININE: 0.94 mg/dL (ref 0.40–1.20)
Chloride: 104 mEq/L (ref 96–112)
GFR: 62.33 mL/min (ref >60.00)
Glucose, Bld: 82 mg/dL (ref 70–99)
Potassium: 4.3 mEq/L (ref 3.5–5.1)
SODIUM: 138 meq/L (ref 135–145)
TOTAL PROTEIN: 6.7 g/dL (ref 6.0–8.3)
Total Bilirubin: 0.5 mg/dL (ref 0.2–1.2)

## 2014-07-31 LAB — LIPID PANEL
CHOLESTEROL: 158 mg/dL (ref 0–200)
HDL: 64.6 mg/dL (ref >39.00)
LDL Cholesterol: 74 mg/dL (ref 0–99)
NonHDL: 93.4
Total CHOL/HDL Ratio: 2
Triglycerides: 99 mg/dL (ref 0.0–149.0)
VLDL: 19.8 mg/dL (ref 0.0–40.0)

## 2014-07-31 LAB — CBC
HEMATOCRIT: 43 % (ref 36.0–46.0)
HEMOGLOBIN: 14.2 g/dL (ref 12.0–15.0)
MCHC: 33.1 g/dL (ref 30.0–36.0)
MCV: 95.2 fl (ref 78.0–100.0)
PLATELETS: 291 10*3/uL (ref 150.0–400.0)
RBC: 4.52 Mil/uL (ref 3.87–5.11)
RDW: 12.8 % (ref 11.5–15.5)
WBC: 5.8 10*3/uL (ref 4.0–10.5)

## 2014-07-31 LAB — TSH: TSH: 2.85 u[IU]/mL (ref 0.35–4.50)

## 2014-07-31 LAB — HEMOGLOBIN A1C: HEMOGLOBIN A1C: 6 % (ref 4.6–6.5)

## 2014-08-01 ENCOUNTER — Ambulatory Visit: Payer: Medicare Other | Admitting: Family

## 2014-08-02 ENCOUNTER — Ambulatory Visit: Payer: Medicare Other | Admitting: Family

## 2014-08-05 ENCOUNTER — Encounter: Payer: Self-pay | Admitting: Family Medicine

## 2014-08-05 ENCOUNTER — Ambulatory Visit (INDEPENDENT_AMBULATORY_CARE_PROVIDER_SITE_OTHER): Payer: Medicare Other | Admitting: Family Medicine

## 2014-08-05 VITALS — BP 120/64 | HR 66 | Temp 98.4°F | Wt 141.0 lb

## 2014-08-05 DIAGNOSIS — Z0000 Encounter for general adult medical examination without abnormal findings: Secondary | ICD-10-CM

## 2014-08-05 DIAGNOSIS — R739 Hyperglycemia, unspecified: Secondary | ICD-10-CM | POA: Insufficient documentation

## 2014-08-05 DIAGNOSIS — I10 Essential (primary) hypertension: Secondary | ICD-10-CM

## 2014-08-05 DIAGNOSIS — Z87891 Personal history of nicotine dependence: Secondary | ICD-10-CM | POA: Insufficient documentation

## 2014-08-05 DIAGNOSIS — E039 Hypothyroidism, unspecified: Secondary | ICD-10-CM

## 2014-08-05 DIAGNOSIS — E785 Hyperlipidemia, unspecified: Secondary | ICD-10-CM

## 2014-08-05 MED ORDER — SYNTHROID 75 MCG PO TABS: ORAL_TABLET | ORAL | Status: DC

## 2014-08-05 MED ORDER — AMITRIPTYLINE HCL 50 MG PO TABS: 50.0000 mg | ORAL_TABLET | Freq: Every day | ORAL | Status: DC

## 2014-08-05 NOTE — Patient Instructions (Addendum)
Belinda Day , Thank you for taking time to come for your Medicare Wellness Visit. I appreciate your ongoing commitment to your health goals. Please review the following plan we discussed and let me know if I can assist you in the future.   These are the goals we discussed: 1. Exercise at least 30 minutes a day 5x a week. Start with first step of increasing to 3x a week from 2x a week, then slowly build up perhaps with another day once a month.  2. When you get your mammogram, see if they can do a bone density. You have osteopenia as of 2013.  3. Try magnesium supplement for cramps. Would do 300 or 400mg .   AWV 1 year from today   This is a list of the screening recommended for you and due dates:  Health Maintenance  Topic Date Due  . Flu Shot  10/26/2014  . Mammogram  12/12/2015  . Colon Cancer Screening  01/26/2018  . Tetanus Vaccine  04/06/2019  . DEXA scan (bone density measurement)  08/2014  . Shingles Vaccine  Completed  . Pneumonia vaccines  Completed

## 2014-08-05 NOTE — Progress Notes (Signed)
Belinda Reddish, MD Phone: [phone]  Subjective:  Patient presents today for their annual wellness visit.    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 But at risk  Lab Results  Component Value Date   HGBA1C 6.0 07/31/2014  BP controlled  Depression Screen None. PHQ2 0   Activities of Daily Living Independent ADLs and IADLs   Hearing Difficulties: -patient declines  Cognitive Testing No reported trouble.   Normal 3 word recall  List the Names of Other Physician/Practitioners you currently use: Cardiology Dr. Stanford Breed Pulmonology Dr. Melvyn Novas GI Dr. Earlean Shawl Dermatology Dr. [place] [facility] Dr. Teryl Lucy ob/gyn  Immunization History  Administered Date(s) Administered  . H1N1 01/02/2008  . Influenza Split 10/14/2010, 10/07/2011  . Influenza,inj,Quad PF,36+ Mos 10/02/2012  . Influenza-Unspecified 09/25/2013  . Pneumococcal Conjugate-13 08/14/2013  . Pneumococcal Polysaccharide-23 10/29/2011   Required Immunizations needed today none  Screening tests- up to date Mammograms in December, last 2015 Seeing Dr. Teryl Lucy ob/gyn, no longer getting paps  ROS- No pertinent positives discovered in course of AWV  The following were reviewed and entered/updated in epic: Past Medical History  Diagnosis Date  . Bronchiectasis     oxygen at night in the past  . MAI (mycobacterium avium-intracellulare)   . CAD (coronary artery disease)   . Hyperlipidemia   . HTN (hypertension)   . GERD  (gastroesophageal reflux disease)   . History of shingles 04/2012  . Hypothyroidism   . Neuritis of upper extremity   . Diverticulitis 2014   Patient Active Problem List   Diagnosis Date Noted  . Osteopenia 08/14/2011    Priority: High  . CAD (coronary artery disease) s/p CABG 07/03/2007    Priority: High  . Insomnia 02/12/2013    Priority: Medium  . Nocturnal hypoxemia 12/12/2012    Priority: Medium  . Fibromyalgia 12/28/2011    Priority: Medium  . Hypothyroidism 04/06/2010    Priority: Medium  . MAI (mycobacterium avium-intracellulare) 11/19/2009    Priority: Medium  . Hyperlipemia 07/03/2007    Priority: Medium  . Essential hypertension 07/03/2007    Priority: Medium  . Bronchiectasis without acute exacerbation but chronic airflow obst on pfts 07/03/2007    Priority: Medium  . Former smoker 08/05/2014    Priority: Low  . Constipation 05/02/2014    Priority: Low  . History of colonic polyps 05/02/2014    Priority: Low  . GERD (gastroesophageal reflux disease) 02/24/2014    Priority: Low  . Hemoptysis 02/24/2014    Priority: Low  . Benign paroxysmal positional vertigo 04/18/2013    Priority: Low  . Diverticulitis 12/21/2012    Priority: Low  . Hyperglycemia 08/05/2014   Past Surgical History  Procedure Laterality Date  . Triple bypass  2004    x3 CABG    Family History  Problem Relation Age of Onset  . Colon cancer Mother     and father  . Hyperlipidemia Mother   . Hypertension Mother   . Heart disease Mother   .  Stroke Mother   . Diabetes Mother   . Colon cancer Father   . Arthritis Father   . Hyperlipidemia Father   . Hypertension Father   . Heart disease Father   . Stroke Father   . Atopy Neg Hx     Medications- reviewed and updated Current Outpatient Prescriptions  Medication Sig Dispense Refill  . amitriptyline (ELAVIL) 50 MG tablet take 1 tablet by mouth at bedtime 90 tablet 1  . aspirin 81 MG tablet Take 81 mg by mouth daily.     Marland Kitchen  atorvastatin (LIPITOR) 80 MG tablet Take 1 tablet (80 mg total) by mouth daily. 90 tablet 3  . azithromycin (ZITHROMAX) 250 MG tablet Take each am 30 tablet 2  . budesonide-formoterol (SYMBICORT) 160-4.5 MCG/ACT inhaler Inhale 2 puffs into the lungs 2 (two) times daily. 10.2 g 11  . BYSTOLIC 5 MG tablet take 1 tablet by mouth once daily 90 tablet 3  . ethambutol (MYAMBUTOL) 400 MG tablet One twice daily 60 tablet 2  . SYNTHROID 75 MCG tablet One tab by mouth once daily 6 days a week. 1/2 tab on 7th day 90 tablet 3  . valsartan (DIOVAN) 160 MG tablet Take 1 tablet (160 mg total) by mouth daily. 90 tablet 3  . acetaminophen (TYLENOL) 650 MG CR tablet every 8 (eight) hours as needed for pain.     Marland Kitchen albuterol (PROAIR HFA) 108 (90 BASE) MCG/ACT inhaler Inhale 2 puffs into the lungs every 6 (six) hours as needed for wheezing or shortness of breath. (Patient not taking: Reported on 08/05/2014) 1 Inhaler 3  . ALPRAZolam (XANAX) 0.25 MG tablet Take 1-2 tablet by mouth at bedtime as needed for sleep (Patient not taking: Reported on 08/05/2014) 60 tablet 1  . meclizine (ANTIVERT) 25 MG tablet Take 1 tablet (25 mg total) by mouth 3 (three) times daily as needed for dizziness or nausea. (Patient not taking: Reported on 08/05/2014) 30 tablet 3     Allergies-reviewed and updated Allergies  Allergen Reactions  . Bactrim [Sulfamethoxazole-Trimethoprim] Hives, Itching and Other (See Comments)    Bruised like areas on body  . Ciprofloxacin Other (See Comments)    Body aches  . Codeine Nausea Only    REACTION: nausea  . Erythromycin Nausea Only    REACTION: nausea  . Levofloxacin Other (See Comments)    REACTION: aches    History   Social History  . Marital Status: Single    Spouse Name: N/A  . Number of Children: N/A  . Years of Education: N/A   Social History Main Topics  . Smoking status: Former Smoker -- 1.00 packs/day for 20 years    Types: Cigarettes    Quit date: 01/25/1970  . Smokeless  tobacco: Never Used  . Alcohol Use: No  . Drug Use: No  . Sexual Activity: Not on file   Other Topics Concern  . None   Social History Narrative   Family: Single never married, no children, cat and rehabs turtles and tortoise   Went to [employer] in [employer] and social work, some business courses at [employer], EMT for 6 years.    LIves alone. Completely independent.    Lives in retirement community.       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 Oropharynx normal CV: RRR no murmurs rubs or gallops Lungs: CTAB no crackles, wheeze, rhonchi Abdomen: soft/nontender/nondistended/normal bowel sounds. No rebound or guarding.  Ext: no edema Skin: warm, dry Neuro: grossly normal, moves all extremities, PERRLA   Assessment/Plan:  Osteopenia (osteopenia -2.0 in 2013. 09/21/11. )- needs repeat DEXA after 8/27.  At risk DM- increase exercise Also see AVS for full plan 6 month f/u  Future labs before next visit Orders Placed This Encounter  Procedures  . CBC    [place]    Standing Status: Future     Number of Occurrences:      Standing Expiration Date: 08/05/2015  . Comprehensive metabolic panel    [place]    Standing Status: Future     Number of Occurrences:      Standing Expiration Date: 08/05/2015  . LDL cholesterol, direct    [place]    Standing Status: Future     Number of Occurrences:      Standing Expiration Date: 08/05/2015  . TSH    [place]    Standing Status: Future     Number of Occurrences:      Standing Expiration Date: 08/05/2015  . Hemoglobin A1c        Standing Status: Future     Number of Occurrences:      Standing Expiration Date: 08/05/2015    Meds ordered this encounter  Medications  .  DISCONTD: SYNTHROID 75 MCG tablet    Sig: One tab by mouth once daily 6 days a week. 1/2 tab on 7th day    Dispense:  90 tablet    Refill:  3  . amitriptyline (ELAVIL) 50 MG tablet    Sig: Take 1 tablet (50 mg total) by mouth at bedtime.    Dispense:  90 tablet    Refill:  3  . SYNTHROID 75 MCG tablet    Sig: One tab by mouth once daily 6 days a week. 1.5 tab on 7th day    Dispense:  95 tablet    Refill:  3    Ignore last rx for 1/2 tab on Sunday, should be 1.5 tabs

## 2014-09-03 ENCOUNTER — Ambulatory Visit (INDEPENDENT_AMBULATORY_CARE_PROVIDER_SITE_OTHER)
Admission: RE | Admit: 2014-09-03 | Discharge: 2014-09-03 | Disposition: A | Discharge status: Home / Self Care | Payer: Medicare Other | Source: Ambulatory Visit | Attending: Internal Medicine | Admitting: Internal Medicine

## 2014-09-03 ENCOUNTER — Ambulatory Visit (INDEPENDENT_AMBULATORY_CARE_PROVIDER_SITE_OTHER): Payer: Medicare Other | Admitting: Internal Medicine

## 2014-09-03 ENCOUNTER — Encounter: Payer: Self-pay | Admitting: Internal Medicine

## 2014-09-03 VITALS — BP 110/64 | HR 66 | Ht 61.0 in | Wt 139.6 lb

## 2014-09-03 DIAGNOSIS — A310 Pulmonary mycobacterial infection: Secondary | ICD-10-CM | POA: Diagnosis not present

## 2014-09-03 DIAGNOSIS — J479 Bronchiectasis, uncomplicated: Secondary | ICD-10-CM

## 2014-09-03 DIAGNOSIS — J449 Chronic obstructive pulmonary disease, unspecified: Secondary | ICD-10-CM | POA: Diagnosis not present

## 2014-09-03 MED ORDER — AZITHROMYCIN 250 MG PO TABS: ORAL_TABLET | ORAL | Status: DC

## 2014-09-03 MED ORDER — FLUTTER DEVI: Status: AC

## 2014-09-03 NOTE — Patient Instructions (Addendum)
Only use your albuterol as a rescue medication to be used if you can't catch your breath by resting or doing a relaxed purse lip breathing pattern.  - The less you use it, the better it will work when you need it. - Ok to use up to 2 puffs  every 4 hours if you must but call for immediate appointment if use goes up over your usual need - Don't leave home without it !!  (think of it like the spare tire for your car)   Stay on symbicort  Add Try prilosec otc 20mg   Take 30-60 min before first meal of the day and Pepcid ac (famotidine) 20 mg one @  bedtime until cough is completely gone for at least a week without the need for cough suppression  Add flutter valve today to you am routine   Please remember to go to the  x-ray department downstairs for your tests - we will call you with the results when they are available.    In event of a flare of nasty mucus > zithromax 250 x 2 then one daily x 4 days and stop  Please schedule a follow up office visit in 6 weeks, call sooner if needed

## 2014-09-03 NOTE — Progress Notes (Signed)
Quick Note:  Spoke with pt and notified of results per Dr. Wert. Pt verbalized understanding and denied any questions.  ______ 

## 2014-09-03 NOTE — Progress Notes (Signed)
Subjective:     Patient ID: Belinda Day, female   DOB: [birth year]    MRN: [id number]   Brief patient profile:  8  yowf quit smoking 1972 with documented right middle lobe  syndrome and evidence of bronchiectasis by CT scan in March 2002   History of Present Illness  08/08/07 FOB with classic cobblestoning and MAI on culture.   08/15/07 given Levaquin x 10 days with resolution bloody mucus, but "felt she had flu the whole time" with aches, feverish   August 29, 2007 ov: first post bronch co still coughing up mucus clear and initiate rx with symbicort/ [place]   October 19, 2007 ov no cough , sob, feeling great but no improvement on cxr   December 07, 2007 ov feeling great, minimal am cough not productive. No sob.   Opth eval, labs ok 10/2007   September 06, 2008 ov overall better over the last year, less tendency to exac on zmax and ethambutol. rec complete another year > satisfied improved 90% and stopped zmax and eth 08/2009   05/31/2014 f/u ov/Belinda Day re: bronchiectasis/chronic airflow obst/  requesting to restart maint zmax/eth Chief Complaint  Patient presents with  . Follow-up    cough still there; SOB at times   Cough worse esp in am > yellow mucus x sev tsp x months  Rare saba need on symbicort 160 2bid rec Zithromax 250 mg daily and ethambutol is 400mg  twice x 3 months  See your eye doctor as soon as possible to let him know you have started ethambutol and stop it if having any vision problems     09/03/2014 f/u ov/Belinda Day re: bronchiectasis/ airflow obst/ no better cough on zmax/etham  Chief Complaint  Patient presents with  . Follow-up    Pt states that her cough comes and goes and is occ prod with light yellow sputum.     Breathing is worse in heat / takes one hour to get mucus up each am   No obvious other  daytime variabilty or assoc s   chest tightness, subjective wheeze overt sinus or hb symptoms. No unusual exp hx or h/o childhood pna/ asthma or  premature birth to her knowledge.   Sleeping ok without nocturnal  or early am exacerbation  of respiratory  c/o's or need for noct saba. Also denies any obvious fluctuation of symptoms with weather or environmental changes or other aggravating or alleviating factors except as outlined above      ROS  The following are not active complaints unless bolded sore throat, dysphagia, dental problems, itching, sneezing,  nasal congestion or excess/ purulent secretions, ear ache,   fever, chills, sweats, unintended wt loss, pleuritic or exertional cp, hemoptysis,  orthopnea pnd or leg swelling, presyncope, palpitations, heartburn, abdominal pain, anorexia, nausea, vomiting, diarrhea  or change in bowel or urinary habits, change in stools or urine, dysuria,hematuria,  rash, arthralgias, visual complaints, headache, numbness weakness or ataxia or problems with walking or coordination,  change in mood/affect or memory.                  Past Medical History:  Bronchiectasis see CT SE 04/13/00  - HFA 75% November 19, 2009  MAI  - Rx Zmax and [name] 08/29/07 > 08/2009 restarted empirically 05/31/14 >>>  - Eye eval   [place] try cycles of cipro April 02, 2010 > changed to bactrim 03/2011 HEALTH MAINTENANCE...........................[name] - T[place] 10/2007  -  Pneumovax 2005   and 10/29/2011 age 69, prevnar 08/16/2013  CAD  Hyperlipidemia  Hypertension  History of cough with ACE inhibition.  Gastroesophageal reflux disease             Objective:   Physical Exam   In general she is an extremely  pleasant ambulatory white female in no acute distress.   Wt 130 October 19, 2007>140 March 31, 2010 > 127 07/16/2010 > 10/14/2010  120 > 05/04/2011  117 > 07/30/2011  120 > 10/29/2011 118 > 130  05/01/2012 > 12/12/2012 132 >  08/14/13 137 >    02/20/2014  137 >  05/31/2014 134 > 09/03/2014    140   HEENT: nl dentition, turbinates, and orophanx. Nl external ear canals without  cough reflex  Neck without JVD/Nodes/TM  Lungs  insp and  exp  rhonchi bilaterally with a few insp pops and sqeaks  RRR no s3 or murmur or increase in P2  Abd soft and benign with nl excursion in the supine position. No bruits or organomegaly  Ext warm without calf tenderness, cyanosis clubbing or edema      I personally reviewed images and agree with radiology impression as follows:  CXR:  09/03/14 COPD and pleural and parenchymal scarring, stable. There is no CHF, pneumonia, nor other acute cardiopulmonary abnormality.            Assessment:

## 2014-09-04 ENCOUNTER — Encounter: Payer: Self-pay | Admitting: Internal Medicine

## 2014-09-04 NOTE — Assessment & Plan Note (Addendum)
-   PFT's 10/14/2010  FEV1  1.25 (68%) and ratio 58% and DLCO 90%     - PFT's 10/29/2011  FEV1  1.33 (74%) and ratio 57 % and DLCO 93%    - PFTs 08/14/2013   FEV1  1.16 (60%) and ratio 61 with dlco 83%     - 02/20/2014 p extensive coaching HFA effectiveness =    90% but baseline < 75%    - Flutter valve added 09/03/14   Will focus on rx of airways dz/ mc dysfunction / cough ? Partly related to GERD ? Cyclical cough? > try rx empirically with max gerd rx/ flutter  I had an extended discussion with the patient reviewing all relevant studies completed to date and  lasting 15 to 20 minutes of a 25 minute visit    Each maintenance medication was reviewed in detail including most importantly the difference between maintenance and prns and under what circumstances the prns are to be triggered using an action plan format that is not reflected in the computer generated alphabetically organized AVS.    Please see instructions for details which were reviewed in writing and the patient given a copy highlighting the part that I personally wrote and discussed at today's ov.

## 2014-09-04 NOTE — Assessment & Plan Note (Signed)
-  08/08/07 FOB with classic cobblestoning and MAI on culture.  - Rx 08/2007   To 08/2009 with ETH/Zmax - restarted empirically 05/31/14 > stopped 09/03/14 no benefit perceived, no change on cxr   Doubt strongly that she has active MAI, rec f/u off rx

## 2014-09-18 DIAGNOSIS — H2513 Age-related nuclear cataract, bilateral: Secondary | ICD-10-CM | POA: Diagnosis not present

## 2014-09-18 DIAGNOSIS — Z79899 Other long term (current) drug therapy: Secondary | ICD-10-CM | POA: Diagnosis not present

## 2014-10-13 ENCOUNTER — Other Ambulatory Visit: Payer: Self-pay | Admitting: Family Medicine

## 2014-10-15 ENCOUNTER — Ambulatory Visit (INDEPENDENT_AMBULATORY_CARE_PROVIDER_SITE_OTHER): Payer: Medicare Other | Admitting: Internal Medicine

## 2014-10-15 ENCOUNTER — Encounter: Payer: Self-pay | Admitting: Internal Medicine

## 2014-10-15 VITALS — BP 132/70 | HR 80 | Ht 62.0 in | Wt 137.0 lb

## 2014-10-15 DIAGNOSIS — J479 Bronchiectasis, uncomplicated: Secondary | ICD-10-CM

## 2014-10-15 DIAGNOSIS — A310 Pulmonary mycobacterial infection: Secondary | ICD-10-CM | POA: Diagnosis not present

## 2014-10-15 DIAGNOSIS — J471 Bronchiectasis with (acute) exacerbation: Secondary | ICD-10-CM

## 2014-10-15 MED ORDER — BUDESONIDE-FORMOTEROL FUMARATE 160-4.5 MCG/ACT IN AERO: 2.0000 | INHALATION_SPRAY | Freq: Two times a day (BID) | RESPIRATORY_TRACT | Status: DC

## 2014-10-15 MED ORDER — ALBUTEROL SULFATE HFA 108 (90 BASE) MCG/ACT IN AERS: 2.0000 | INHALATION_SPRAY | Freq: Four times a day (QID) | RESPIRATORY_TRACT | Status: DC | PRN

## 2014-10-15 NOTE — Progress Notes (Signed)
Subjective:     Patient ID: Belinda Day, female   DOB: September 20, 1945    MRN: [id number]   Brief patient profile:  69  yowf quit smoking 1972 with documented right middle lobe  syndrome and evidence of bronchiectasis by CT scan in March 2002   History of Present Illness  08/08/07 FOB with classic cobblestoning and MAI on culture.   08/15/07 given Levaquin x 10 days with resolution bloody mucus, but "felt she had flu the whole time" with aches, feverish   August 29, 2007 ov: first post bronch co still coughing up mucus clear and initiate rx with symbicort/ [place]   October 19, 2007 ov no cough , sob, feeling great but no improvement on cxr   December 07, 2007 ov feeling great, minimal am cough not productive. No sob.   Opth eval, labs ok 10/2007   September 06, 2008 ov overall better over the last year, less tendency to exac on zmax and ethambutol. rec complete another year > satisfied improved 90% and stopped zmax and eth 08/2009   05/31/2014 f/u ov/Wert re: bronchiectasis/chronic airflow obst/  requesting to restart maint zmax/eth Chief Complaint  Patient presents with  . Follow-up    cough still there; SOB at times   Cough worse esp in am > yellow mucus x sev tsp x months  Rare saba need on symbicort 160 2bid rec Zithromax 250 mg daily and ethambutol is 400mg  twice x 3 months  See your eye doctor as soon as possible to let him know you have started ethambutol and stop it if having any vision problems     09/03/2014 f/u ov/Wert re: bronchiectasis/ airflow obst/ no better cough on zmax/etham  Chief Complaint  Patient presents with  . Follow-up    Pt states that her cough comes and goes and is occ prod with light yellow sputum.    Breathing is worse in heat / takes one hour to get mucus up each am  rec Only use your albuterol as a rescue medication  Stay on symbicort Add Try prilosec otc 20mg   Take 30-60 min before first meal of the day and Pepcid ac (famotidine)  20 mg one @  bedtime until cough is completely gone for at least a week without the need for cough suppression Add flutter valve today to you am routine  Please remember to go to the  x-ray department downstairs for your tests - we will call you with the results when they are available. In event of a flare of nasty mucus > zithromax 250 x 2 then one daily x 4 days and stop    10/15/2014  f/u ov/Wert re: bronchiectasis / maint rx = symb 160 2id  Chief Complaint  Patient presents with  . Follow-up    Feeling a little better.Cough is better but worse in am-pale yellow and early afternoon-dry. Using flutter valve two times a day.Sob with exertion-same,no wheezing.Occass. has "spasms" across abdomen and back when coughing.Needs Proair and Symbicort refills.Wants flu shot     Not limited by breathing from desired activities    No obvious day to day or  daytime variabilty or assoc s   chest tightness, subjective wheeze overt sinus or hb symptoms. No unusual exp hx or h/o childhood pna/ asthma or premature birth to her knowledge.   Sleeping ok without nocturnal  or early am exacerbation  of respiratory  c/o's or need for noct saba. Also denies any obvious fluctuation of  symptoms with weather or environmental changes or other aggravating or alleviating factors except as outlined above      ROS  The following are not active complaints unless bolded sore throat, dysphagia, dental problems, itching, sneezing,  nasal congestion or excess/ purulent secretions, ear ache,   fever, chills, sweats, unintended wt loss, pleuritic or exertional cp, hemoptysis,  orthopnea pnd or leg swelling, presyncope, palpitations, heartburn, abdominal pain, anorexia, nausea, vomiting, diarrhea  or change in bowel or urinary habits, change in stools or urine, dysuria,hematuria,  rash, arthralgias, visual complaints, headache, numbness weakness or ataxia or problems with walking or coordination,  change in mood/affect or memory.                   Past Medical History:  Bronchiectasis see CT SE 04/13/00  - HFA 75% November 19, 2009  MAI  - Rx Zmax and [facility] 08/29/07 > 08/2009 restarted empirically 05/31/14 >>>  - Eye eval   10/09[place]....[place] try cycles of cipro April 02, 2010 > changed to bactrim 03/2011 HEALTH MAINTENANCE...........................[name] - Td 10/2007  - Pneumovax 2005   and 10/29/2011 age 69, prevnar 08/16/2013  CAD  Hyperlipidemia  Hypertension  History of cough with ACE inhibition.  Gastroesophageal reflux disease             Objective:   Physical Exam   In general she is an extremely  pleasant ambulatory white female in no acute distress.   Wt 130 October 19, 2007>140 March 31, 2010 > 127 07/16/2010 > 10/14/2010  120 > 05/04/2011  117 > 07/30/2011  120 > 10/29/2011 118 > 130  05/01/2012 > 12/12/2012 132 >  08/14/13 137 >    02/20/2014  137 >  05/31/2014 134 > 09/03/2014    140 > 10/15/2014 137   HEENT: nl dentition, turbinates, and orophanx. Nl external ear canals without cough reflex  Neck without JVD/Nodes/TM  Lungs  insp and  exp  rhonchi bilaterally with a few insp pops and sqeaks  RRR no s3 or murmur or increase in P2  Abd soft and benign with nl excursion in the supine position. No bruits or organomegaly  Ext warm without calf tenderness, cyanosis clubbing or edema      I personally reviewed images and agree with radiology impression as follows:  CXR:  09/03/14 COPD and pleural and parenchymal scarring, stable. There is no CHF, pneumonia, nor other acute cardiopulmonary abnormality.            Assessment:

## 2014-10-15 NOTE — Patient Instructions (Signed)
Take pepcid 20 mg after bfast and supper/or bedtime  GERD (REFLUX)  is an extremely common cause of respiratory symptoms just like yours , many times with no obvious heartburn at all.    It can be treated with medication, but also with lifestyle changes including elevation of the head of your bed (ideally with 6 inch  bed blocks),  Smoking cessation, avoidance of late meals, excessive alcohol, and avoid fatty foods, chocolate, peppermint, colas, red wine, and acidic juices such as orange juice.  NO MINT OR MENTHOL PRODUCTS SO NO COUGH DROPS  USE SUGARLESS CANDY INSTEAD (Jolley ranchers or Stover's or Life Savers) or even ice chips will also do - the key is to swallow to prevent all throat clearing. NO OIL BASED VITAMINS - use powdered substitutes.    Please schedule a follow up visit in 3 months but call sooner if needed

## 2014-10-21 NOTE — Assessment & Plan Note (Signed)
   -   PFT's 10/14/2010  FEV1  1.25 (68%) and ratio 58% and DLCO 90%     - PFT's 10/29/2011  FEV1  1.33 (74%) and ratio 57 % and DLCO 93%    - PFTs 08/14/2013   FEV1  1.16 (60%) and ratio 61 with dlco 83%     - Flutter valve added 09/03/14       - 10/15/2014  extensive coaching HFA effectiveness =  90%   I had an extended discussion with the patient reviewing all relevant studies completed to date and  lasting 10 minutes of a 15 minute visit    Each maintenance medication was reviewed in detail including most importantly the difference between maintenance and prns and under what circumstances the prns are to be triggered using an action plan format that is not reflected in the computer generated alphabetically organized AVS.    Please see instructions for details which were reviewed in writing and the patient given a copy highlighting the part that I personally wrote and discussed at today's ov.

## 2014-10-21 NOTE — Assessment & Plan Note (Signed)
-  08/08/07 FOB with classic cobblestoning and MAI on culture.  - Rx 08/2007   To 08/2009 with ETH/Zmax - restarted empirically 05/31/14 > stopped 09/03/14 no benefit perceived, no change on cxr   No worse off rx/ no change in recs

## 2014-11-05 ENCOUNTER — Encounter: Payer: Self-pay | Admitting: Cardiology

## 2014-11-05 ENCOUNTER — Telehealth: Payer: Self-pay | Admitting: Cardiology

## 2014-11-07 NOTE — Telephone Encounter (Signed)
Close encounter 

## 2014-11-25 ENCOUNTER — Telehealth: Payer: Self-pay | Admitting: Family Medicine

## 2014-11-25 NOTE — Telephone Encounter (Signed)
Rx pad placed on your desk

## 2014-11-25 NOTE — Telephone Encounter (Signed)
Pt states dr hunter advised bone density test. Pt needs order faxed to [address]  Fax  [phone]

## 2014-11-25 NOTE — Telephone Encounter (Signed)
Completed please fax thanks

## 2014-11-25 NOTE — Telephone Encounter (Signed)
Rx faxed to [facility].

## 2014-12-13 DIAGNOSIS — M85852 Other specified disorders of bone density and structure, left thigh: Secondary | ICD-10-CM | POA: Diagnosis not present

## 2014-12-13 DIAGNOSIS — Z1231 Encounter for screening mammogram for malignant neoplasm of breast: Secondary | ICD-10-CM | POA: Diagnosis not present

## 2014-12-13 LAB — HM DEXA SCAN

## 2014-12-13 LAB — HM MAMMOGRAPHY: HM Mammogram: NORMAL

## 2014-12-17 ENCOUNTER — Encounter: Payer: Self-pay | Admitting: Family Medicine

## 2014-12-23 ENCOUNTER — Encounter: Payer: Self-pay | Admitting: Family Medicine

## 2014-12-25 ENCOUNTER — Encounter: Payer: Self-pay | Admitting: Family Medicine

## 2014-12-25 ENCOUNTER — Other Ambulatory Visit: Payer: Self-pay | Admitting: Family Medicine

## 2014-12-25 DIAGNOSIS — Z1159 Encounter for screening for other viral diseases: Secondary | ICD-10-CM

## 2015-01-09 ENCOUNTER — Other Ambulatory Visit: Payer: Self-pay | Admitting: Cardiology

## 2015-01-09 NOTE — Telephone Encounter (Signed)
Rx(s) sent to pharmacy electronically.  

## 2015-01-13 ENCOUNTER — Ambulatory Visit: Payer: Medicare Other | Admitting: Cardiology

## 2015-01-14 ENCOUNTER — Ambulatory Visit (INDEPENDENT_AMBULATORY_CARE_PROVIDER_SITE_OTHER): Payer: Medicare Other | Admitting: Internal Medicine

## 2015-01-14 ENCOUNTER — Other Ambulatory Visit: Payer: Medicare Other

## 2015-01-14 ENCOUNTER — Encounter: Payer: Self-pay | Admitting: Internal Medicine

## 2015-01-14 DIAGNOSIS — J479 Bronchiectasis, uncomplicated: Secondary | ICD-10-CM

## 2015-01-14 DIAGNOSIS — J471 Bronchiectasis with (acute) exacerbation: Secondary | ICD-10-CM | POA: Diagnosis not present

## 2015-01-14 NOTE — Assessment & Plan Note (Signed)
-   PFT's 10/14/2010  FEV1  1.25 (68%) and ratio 58% and DLCO 90%     - PFT's 10/29/2011  FEV1  1.33 (74%) and ratio 57 % and DLCO 93%    - PFTs 08/14/2013   FEV1  1.16 (60%) and ratio 61 with dlco 83%     - Flutter valve added 09/03/14     - 10/15/2014  extensive coaching HFA effectiveness =  90%   Overall better on present rx with prn courses of zmax   I had an extended discussion with the patient reviewing all relevant studies completed to date and  lasting 15 to 20 minutes of a 25 minute visit    Each maintenance medication was reviewed in detail including most importantly the difference between maintenance and prns and under what circumstances the prns are to be triggered using an action plan format that is not reflected in the computer generated alphabetically organized AVS.    Please see instructions for details which were reviewed in writing and the patient given a copy highlighting the part that I personally wrote and discussed at today's ov.

## 2015-01-14 NOTE — Progress Notes (Signed)
Subjective:     Patient ID: Belinda Day, female   DOB: 24-Jul-1945    MRN: [id number]   Brief patient profile:  69  yowf quit smoking 1972 with documented right middle lobe  syndrome and evidence of bronchiectasis by CT scan in March 2002   History of Present Illness  08/08/07 FOB with classic cobblestoning and MAI on culture.   08/15/07 given Levaquin x 10 days with resolution bloody mucus, but "felt she had flu the whole time" with aches, feverish   August 29, 2007 ov: first post bronch co still coughing up mucus clear and initiate rx with symbicort/ Vevay   October 19, 2007 ov no cough , sob, feeling great but no improvement on cxr   December 07, 2007 ov feeling great, minimal am cough not productive. No sob.   Opth eval, labs ok 10/2007   September 06, 2008 ov overall better over the last year, less tendency to exac on zmax and ethambutol. rec complete another year > satisfied improved 90% and stopped zmax and eth 08/2009   05/31/2014 f/u ov/Michah Minton re: bronchiectasis/chronic airflow obst/  requesting to restart maint zmax/eth Chief Complaint  Patient presents with  . Follow-up    cough still there; SOB at times   Cough worse esp in am > yellow mucus x sev tsp x months  Rare saba need on symbicort 160 2bid rec Zithromax 250 mg daily and ethambutol is 400mg  twice x 3 months  See your eye doctor as soon as possible to let him know you have started ethambutol and stop it if having any vision problems     09/03/2014 f/u ov/Diallo Ponder re: bronchiectasis/ airflow obst/ no better cough on zmax/etham  Chief Complaint  Patient presents with  . Follow-up    Pt states that her cough comes and goes and is occ prod with light yellow sputum.    Breathing is worse in heat / takes one hour to get mucus up each am  rec Only use your albuterol as a rescue medication  Stay on symbicort Add Try prilosec otc 20mg   Take 30-60 min before first meal of the day and Pepcid ac (famotidine)  20 mg one @  bedtime until cough is completely gone for at least a week without the need for cough suppression Add flutter valve today to you am routine   In event of a flare of nasty mucus > zithromax 250 x 2 then one daily x 4 days and stop    10/15/2014  f/u ov/Kimmie Doren re: bronchiectasis / maint rx = symb 160 2id  Chief Complaint  Patient presents with  . Follow-up    Feeling a little better.Cough is better but worse in am-pale yellow and early afternoon-dry. Using flutter valve two times a day.Sob with exertion-same,no wheezing.Occass. has "spasms" across abdomen and back when coughing.Needs Proair and Symbicort refills.Wants flu shot  Not limited by breathing from desired activities   rec Take pepcid 20 mg after bfast and supper/or bedtime GERD (REFLUX) diet   01/14/2015  f/u ov/Meda Dudzinski re: obst bronchiectasis/ symb 160 2bid/ rare saba/ has flutter helps some / much better p adding h2 bid   Chief Complaint  Patient presents with  . Follow-up    pt following for obstruvtive bronchitis: pt states she is finishing up a second round of azithromycin. pt sttaes she does still have a litttle prod cough yellowidh in color, and some wheezing. no c/o of SOB or chest tightness.  Not limited by breathing from desired activities    No obvious day to day or  daytime variabilty or assoc  chest tightness,  overt sinus or hb symptoms. No unusual exp hx or h/o childhood pna/ asthma or premature birth to her knowledge.   Sleeping ok without nocturnal  or early am exacerbation  of respiratory  c/o's or need for noct saba. Also denies any obvious fluctuation of symptoms with weather or environmental changes or other aggravating or alleviating factors except as outlined above      ROS  The following are not active complaints unless bolded sore throat, dysphagia, dental problems, itching, sneezing,  nasal congestion or excess/ purulent secretions, ear ache,   fever, chills, sweats, unintended wt loss,  pleuritic or exertional cp, hemoptysis,  orthopnea pnd or leg swelling, presyncope, palpitations, heartburn, abdominal pain, anorexia, nausea, vomiting, diarrhea  or change in bowel or urinary habits, change in stools or urine, dysuria,hematuria,  rash, arthralgias, visual complaints, headache, numbness weakness or ataxia or problems with walking or coordination,  change in mood/affect or memory.                  Past Medical History:  Bronchiectasis see CT SE 04/13/00  - HFA 75% November 19, 2009  - alpha one screen 01/14/2015 >>>  - IgE 01/14/2015 >>>  MAI  - Rx Zmax and ETH 08/29/07 > 08/2009 restarted empirically 05/31/14 > 09/03/14 (no change in cough so just use zpak for flares)  - Eye eval   10/09.......................[place] Kitchen[place] so try cycles of cipro April 02, 2010 > changed to bactrim 03/2011 HEALTH MAINTENANCE...........................[name] - Td 10/2007  - Pneumovax 2005   and 10/29/2011 age 12, prevnar 08/16/2013  CAD  Hyperlipidemia  Hypertension  History of cough with ACE inhibition.  Gastroesophageal reflux disease             Objective:   Physical Exam   In general she is an extremely  pleasant ambulatory white female in no acute distress.  Vital signs reviewed    Wt 130 October 19, 2007>140 March 31, 2010 > 127 07/16/2010 > 10/14/2010  120 > 05/04/2011  117 > 07/30/2011  120 > 10/29/2011 118 > 130  05/01/2012 > 12/12/2012 132 >  08/14/13 137 >    02/20/2014  137 >  05/31/2014 134 > 09/03/2014    140 > 10/15/2014 137 > 01/14/2015 137   HEENT: nl dentition, turbinates, and orophanx. Nl external ear canals without cough reflex  Neck without JVD/Nodes/TM  Lungs  insp and  exp  rhonchi bilaterally with a few insp pops and sqeaks bilaterally also  RRR no s3 or murmur or increase in P2  Abd soft and benign with nl excursion in the supine position. No bruits or organomegaly  Ext warm without calf tenderness, cyanosis clubbing or edema      I personally reviewed  images and agree with radiology impression as follows:  CXR:  09/03/14 COPD and pleural and parenchymal scarring, stable. There is no CHF, pneumonia, nor other acute cardiopulmonary abnormality.       Labs ordered 01/14/2015  Alpha one screen, IgE      Assessment:

## 2015-01-14 NOTE — Patient Instructions (Addendum)
No change in recommendations  Keep the candy handy to prevent throat clearing  Please remember to go to the lab  department downstairs for your tests - we will call you with the results when they are available.    Please schedule a follow up visit in 4 months but call sooner if needed

## 2015-01-15 LAB — ALLERGY FULL PROFILE
Allergen, D pternoyssinus,d7: <0.1 kU/L
Allergen,Goose feathers, e70: <0.1 kU/L
Aspergillus fumigatus, m3: <0.1 kU/L
Candida Albicans: <0.1 kU/L
Common Ragweed: <0.1 kU/L
Fescue: <0.1 kU/L
G005 Rye, Perennial: <0.1 kU/L
Goldenrod: <0.1 kU/L
Helminthosporium halodes: <0.1 kU/L
House Dust Hollister: <0.1 kU/L
IgE (Immunoglobulin E), Serum: 11 kU/L (ref <115)
Lamb's Quarters: <0.1 kU/L
Stemphylium Botryosum: <0.1 kU/L
Sycamore Tree: <0.1 kU/L
Timothy Grass: <0.1 kU/L

## 2015-01-16 ENCOUNTER — Encounter: Payer: Self-pay | Admitting: Cardiology

## 2015-01-16 LAB — ALPHA-1-ANTITRYPSIN: A-1 Antitrypsin, Ser: 143 mg/dL (ref 83–199)

## 2015-01-17 ENCOUNTER — Other Ambulatory Visit: Payer: Self-pay

## 2015-01-17 MED ORDER — ATORVASTATIN CALCIUM 80 MG PO TABS: 80.0000 mg | ORAL_TABLET | Freq: Every day | ORAL | Status: DC

## 2015-01-17 NOTE — Telephone Encounter (Signed)
Refill sent for atorvastatin. 

## 2015-01-21 LAB — ALPHA-1 ANTITRYPSIN PHENOTYPE: A-1 Antitrypsin: 147 mg/dL (ref 83–199)

## 2015-01-22 ENCOUNTER — Telehealth: Payer: Self-pay | Admitting: Internal Medicine

## 2015-01-22 NOTE — Progress Notes (Signed)
Quick Note:  Pt returned call. Reviewed results and recs. Pt voiced understanding and had no further questions. ______ 

## 2015-01-22 NOTE — Telephone Encounter (Signed)
Notes Recorded by Tanda Rockers, MD on 01/22/2015 at 5:43 AM Call patient : Study is unremarkable, no change in recs  Called and spoke with patient. Informed her of MW's results and recs. Pt voiced understanding and had no further questions. Nothing further needed.

## 2015-01-29 ENCOUNTER — Other Ambulatory Visit (INDEPENDENT_AMBULATORY_CARE_PROVIDER_SITE_OTHER): Payer: Medicare Other

## 2015-01-29 DIAGNOSIS — R739 Hyperglycemia, unspecified: Secondary | ICD-10-CM

## 2015-01-29 DIAGNOSIS — E039 Hypothyroidism, unspecified: Secondary | ICD-10-CM

## 2015-01-29 DIAGNOSIS — Z1159 Encounter for screening for other viral diseases: Secondary | ICD-10-CM | POA: Diagnosis not present

## 2015-01-29 DIAGNOSIS — I10 Essential (primary) hypertension: Secondary | ICD-10-CM | POA: Diagnosis not present

## 2015-01-29 DIAGNOSIS — E785 Hyperlipidemia, unspecified: Secondary | ICD-10-CM | POA: Diagnosis not present

## 2015-01-29 LAB — TSH: TSH: 1.6 u[IU]/mL (ref 0.35–4.50)

## 2015-01-29 LAB — CBC
HCT: 42.2 % (ref 36.0–46.0)
Hemoglobin: 14 g/dL (ref 12.0–15.0)
MCHC: 33.2 g/dL (ref 30.0–36.0)
MCV: 94.1 fl (ref 78.0–100.0)
PLATELETS: 276 10*3/uL (ref 150.0–400.0)
RBC: 4.49 Mil/uL (ref 3.87–5.11)
RDW: 13.3 % (ref 11.5–15.5)
WBC: 6.7 10*3/uL (ref 4.0–10.5)

## 2015-01-29 LAB — COMPREHENSIVE METABOLIC PANEL
ALBUMIN: 4 g/dL (ref 3.5–5.2)
ALK PHOS: 82 U/L (ref 39–117)
ALT: 16 U/L (ref 0–35)
AST: 22 U/L (ref 0–37)
BUN: 17 mg/dL (ref 6–23)
CALCIUM: 9.5 mg/dL (ref 8.4–10.5)
CHLORIDE: 101 meq/L (ref 96–112)
CO2: 28 mEq/L (ref 19–32)
CREATININE: 0.95 mg/dL (ref 0.40–1.20)
GFR: 61.48 mL/min (ref >60.00)
Glucose, Bld: 92 mg/dL (ref 70–99)
POTASSIUM: 4.3 meq/L (ref 3.5–5.1)
Sodium: 137 mEq/L (ref 135–145)
Total Bilirubin: 0.6 mg/dL (ref 0.2–1.2)
Total Protein: 6.8 g/dL (ref 6.0–8.3)

## 2015-01-29 LAB — HEMOGLOBIN A1C: Hgb A1c MFr Bld: 6.3 % (ref 4.6–6.5)

## 2015-01-29 LAB — LDL CHOLESTEROL, DIRECT: LDL DIRECT: 68 mg/dL

## 2015-01-29 NOTE — Progress Notes (Signed)
HPI: FU CAD; history of coronary artery disease status post coronary artery bypass graft in 2002. Nuclear study January 2015 showed an ejection fraction of 89% and normal perfusion. Carotid Dopplers December 2015 showed no significant obstruction. Since last seen, the patient has dyspnea with more extreme activities but not with routine activities. It is relieved with rest. It is not associated with chest pain. There is no orthopnea, PND or pedal edema. There is no syncope or palpitations. There is no exertional chest pain.   Current Outpatient Prescriptions  Medication Sig Dispense Refill  . ALPRAZolam (XANAX) 0.25 MG tablet take 1 tablet by mouth at bedtime if needed for sleep 60 tablet 4  . amitriptyline (ELAVIL) 50 MG tablet Take 1 tablet (50 mg total) by mouth at bedtime. 90 tablet 3  . aspirin 81 MG tablet Take 81 mg by mouth daily.     Marland Kitchen atorvastatin (LIPITOR) 80 MG tablet Take 1 tablet (80 mg total) by mouth daily. 90 tablet 3  . azithromycin (ZITHROMAX) 250 MG tablet Take each am 30 tablet 2  . budesonide-formoterol (SYMBICORT) 160-4.5 MCG/ACT inhaler Inhale 2 puffs into the lungs 2 (two) times daily. 10.2 g 11  . BYSTOLIC 5 MG tablet take 1 tablet by mouth once daily 90 tablet 3  . meclizine (ANTIVERT) 25 MG tablet Take 1 tablet (25 mg total) by mouth 3 (three) times daily as needed for dizziness or nausea. 30 tablet 3  . Respiratory Therapy Supplies (FLUTTER) DEVI Use as directed 1 each 0  . SYNTHROID 75 MCG tablet One tab by mouth once daily 6 days a week. 1.5 tab on 7th day 95 tablet 3  . valsartan (DIOVAN) 160 MG tablet take 1 tablet by mouth once daily 90 tablet 3  . acetaminophen (TYLENOL) 650 MG CR tablet every 8 (eight) hours as needed for pain.     Marland Kitchen albuterol (PROAIR HFA) 108 (90 BASE) MCG/ACT inhaler Inhale 2 puffs into the lungs every 6 (six) hours as needed for wheezing or shortness of breath. 1 Inhaler 11   No current facility-administered medications for this  visit.     Past Medical History  Diagnosis Date  . Bronchiectasis     oxygen at night in the past  . MAI (mycobacterium avium-intracellulare) ([place])   . CAD (coronary artery disease)   . Hyperlipidemia   . HTN (hypertension)   . GERD (gastroesophageal reflux disease)   . History of shingles 04/2012  . Hypothyroidism   . Neuritis of upper extremity   . Diverticulitis 2014    Past Surgical History  Procedure Laterality Date  . Triple bypass  2004    x3 CABG    Social History   Social History  . Marital Status: Single    Spouse Name: N/A  . Number of Children: N/A  . Years of Education: N/A   Occupational History  . Not on file.   Social History Main Topics  . Smoking status: Former Smoker -- 1.00 packs/day for 20 years    Types: Cigarettes    Quit date: 01/25/1970  . Smokeless tobacco: Never Used  . Alcohol Use: No  . Drug Use: No  . Sexual Activity: Not on file   Other Topics Concern  . Not on file   Social History Narrative   Family: Single never married, no children, cat and rehabs turtles and tortoise   Went to [employer] in [employer] and social work, some business courses at [employer],  EMT for 6 years.    LIves alone. Completely independent.    Lives in retirement community.       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 Remaining systems are negative.  Physical Exam: Well-developed well-nourished in no acute distress.  Skin is warm and dry.  HEENT is normal.  Neck is supple.  Chest is clear to auscultation with normal expansion.  Cardiovascular exam is regular rate and rhythm.  Abdominal exam nontender or distended. No masses palpated. Extremities show no  edema. neuro grossly intact  ECG Sinus rhythm at a rate of 74. Nonspecific ST changes.

## 2015-01-30 LAB — HEPATITIS C ANTIBODY: HCV AB: NEGATIVE

## 2015-01-31 ENCOUNTER — Ambulatory Visit (INDEPENDENT_AMBULATORY_CARE_PROVIDER_SITE_OTHER): Payer: Medicare Other | Admitting: Cardiology

## 2015-01-31 ENCOUNTER — Encounter: Payer: Self-pay | Admitting: Cardiology

## 2015-01-31 VITALS — BP 124/68 | HR 74 | Ht 61.0 in | Wt 139.1 lb

## 2015-01-31 DIAGNOSIS — I25810 Atherosclerosis of coronary artery bypass graft(s) without angina pectoris: Secondary | ICD-10-CM

## 2015-01-31 DIAGNOSIS — I679 Cerebrovascular disease, unspecified: Secondary | ICD-10-CM | POA: Insufficient documentation

## 2015-01-31 DIAGNOSIS — E785 Hyperlipidemia, unspecified: Secondary | ICD-10-CM | POA: Diagnosis not present

## 2015-01-31 DIAGNOSIS — I2510 Atherosclerotic heart disease of native coronary artery without angina pectoris: Secondary | ICD-10-CM

## 2015-01-31 DIAGNOSIS — I2583 Coronary atherosclerosis due to lipid rich plaque: Principal | ICD-10-CM

## 2015-01-31 NOTE — Assessment & Plan Note (Signed)
Most recent carotid Dopplers did not show significant obstruction. Continue aspirin and statin.

## 2015-01-31 NOTE — Assessment & Plan Note (Signed)
Continue aspirin and statin. 

## 2015-01-31 NOTE — Assessment & Plan Note (Signed)
Continue statin. 

## 2015-01-31 NOTE — Patient Instructions (Signed)
Your physician wants you to follow-up in: ONE YEAR WITH DR CRENSHAW You will receive a reminder letter in the mail two months in advance. If you don't receive a letter, please call our office to schedule the follow-up appointment.   If you need a refill on your cardiac medications before your next appointment, please call your pharmacy.  

## 2015-01-31 NOTE — Assessment & Plan Note (Signed)
Blood pressure controlled. Continue present medications. 

## 2015-02-05 ENCOUNTER — Ambulatory Visit (INDEPENDENT_AMBULATORY_CARE_PROVIDER_SITE_OTHER): Payer: Medicare Other | Admitting: Family Medicine

## 2015-02-05 ENCOUNTER — Encounter: Payer: Self-pay | Admitting: Family Medicine

## 2015-02-05 VITALS — BP 120/64 | HR 74 | Temp 98.1°F | Wt 140.0 lb

## 2015-02-05 DIAGNOSIS — E785 Hyperlipidemia, unspecified: Secondary | ICD-10-CM | POA: Diagnosis not present

## 2015-02-05 DIAGNOSIS — I10 Essential (primary) hypertension: Secondary | ICD-10-CM | POA: Diagnosis not present

## 2015-02-05 DIAGNOSIS — E039 Hypothyroidism, unspecified: Secondary | ICD-10-CM | POA: Diagnosis not present

## 2015-02-05 DIAGNOSIS — R739 Hyperglycemia, unspecified: Secondary | ICD-10-CM

## 2015-02-05 MED ORDER — METFORMIN HCL 500 MG PO TABS: 500.0000 mg | ORAL_TABLET | Freq: Every day | ORAL | Status: DC

## 2015-02-05 NOTE — Patient Instructions (Addendum)
Increase exercise as lungs allow. Try to cut down on the carbs (breads/pastas)  Take metformin with breakfast to help prevent diabetes. Take 1/2 tab for a week then increase to full tab a week later

## 2015-02-05 NOTE — Assessment & Plan Note (Signed)
S: well controlled on atorvastatin 80mg  though does have some myalgias.  Lab Results  Component Value Date   CHOL 158 07/31/2014   HDL 64.60 07/31/2014   LDLCALC 74 07/31/2014   LDLDIRECT 68.0 01/29/2015   TRIG 99.0 07/31/2014   CHOLHDL 2 07/31/2014   A/P: LDL at goal <70. Continue statin and follow up 6 months

## 2015-02-05 NOTE — Progress Notes (Signed)
Garret Reddish, MD  Subjective:  Belinda Day is a 70 y.o. year old very pleasant female patient who presents for/with See problem oriented charting ROS- No chest pain. occasional shortness of breath. Continued wheeze. No headache or blurry vision.   Past Medical History-  Patient Active Problem List   Diagnosis Date Noted  . CAD (coronary artery disease) s/p CABG 07/03/2007    Priority: High  . Hyperglycemia 08/05/2014    Priority: Medium  . Insomnia 02/12/2013    Priority: Medium  . Nocturnal hypoxemia 12/12/2012    Priority: Medium  . Fibromyalgia 12/28/2011    Priority: Medium  . Hypothyroidism 04/06/2010    Priority: Medium  . MAI (mycobacterium avium-intracellulare) ([place]) 11/19/2009    Priority: Medium  . Hyperlipemia 07/03/2007    Priority: Medium  . Essential hypertension 07/03/2007    Priority: Medium  . Obstructive bronchiectasis ([place]) 07/03/2007    Priority: Medium  . Former smoker 08/05/2014    Priority: Low  . Constipation 05/02/2014    Priority: Low  . History of colonic polyps 05/02/2014    Priority: Low  . GERD (gastroesophageal reflux disease) 02/24/2014    Priority: Low  . Hemoptysis 02/24/2014    Priority: Low  . Benign paroxysmal positional vertigo 04/18/2013    Priority: Low  . Diverticulitis 12/21/2012    Priority: Low  . Osteopenia 08/14/2011    Priority: Low  . Cerebrovascular disease 01/31/2015    Medications- reviewed and updated Current Outpatient Prescriptions  Medication Sig Dispense Refill  . acetaminophen (TYLENOL) 650 MG CR tablet every 8 (eight) hours as needed for pain.     Marland Kitchen albuterol (PROAIR HFA) 108 (90 BASE) MCG/ACT inhaler Inhale 2 puffs into the lungs every 6 (six) hours as needed for wheezing or shortness of breath. 1 Inhaler 11  . ALPRAZolam (XANAX) 0.25 MG tablet take 1 tablet by mouth at bedtime if needed for sleep 60 tablet 4  . amitriptyline (ELAVIL) 50 MG tablet Take 1 tablet (50 mg total) by mouth at bedtime. 90  tablet 3  . aspirin 81 MG tablet Take 81 mg by mouth daily.     Marland Kitchen atorvastatin (LIPITOR) 80 MG tablet Take 1 tablet (80 mg total) by mouth daily. 90 tablet 3  . azithromycin (ZITHROMAX) 250 MG tablet Take each am 30 tablet 2  . budesonide-formoterol (SYMBICORT) 160-4.5 MCG/ACT inhaler Inhale 2 puffs into the lungs 2 (two) times daily. 10.2 g 11  . BYSTOLIC 5 MG tablet take 1 tablet by mouth once daily 90 tablet 3  . meclizine (ANTIVERT) 25 MG tablet Take 1 tablet (25 mg total) by mouth 3 (three) times daily as needed for dizziness or nausea. 30 tablet 3  . Respiratory Therapy Supplies (FLUTTER) DEVI Use as directed 1 each 0  . SYNTHROID 75 MCG tablet One tab by mouth once daily 6 days a week. 1.5 tab on 7th day 95 tablet 3  . valsartan (DIOVAN) 160 MG tablet take 1 tablet by mouth once daily 90 tablet 3   No current facility-administered medications for this visit.    Objective: BP 120/64 mmHg  Pulse 74  Temp(Src) 98.1 F (36.7 C) (Oral)  Wt 140 lb (63.504 kg)  SpO2 98% Gen: NAD, resting comfortably No thyromegaly CV: RRR no murmurs rubs or gallops Lungs: no crackles, diffuse wheeze per baseline, no rhonchi Abdomen: soft/nontender/nondistended/normal bowel sounds. Ext: no edema Skin: warm, dry, no rash Neuro: grossly normal, moves all extremities  Assessment/Plan:  Essential hypertension S: controlled. On  Bystolic 5mg , valsartan 160mg   BP Readings from Last 3 Encounters:  02/05/15 120/64  01/31/15 124/68  01/14/15 110/68  A/P:Continue current meds:  Doing well   Hyperglycemia S: a1c increased to 6.3 from 6.0 previously. Had difficulty being active due to lungs A/P: start metformin 500mg  and repeat in 6 months. Discussed lifestyle changes as well but with family history in nonobese mother- wanted to be proactive   Hyperlipemia S: well controlled on atorvastatin 80mg  though does have some myalgias.  Lab Results  Component Value Date   CHOL 158 07/31/2014   HDL 64.60  07/31/2014   LDLCALC 74 07/31/2014   LDLDIRECT 68.0 01/29/2015   TRIG 99.0 07/31/2014   CHOLHDL 2 07/31/2014   A/P: LDL at goal <70. Continue statin and follow up 6 months     Hypothyroidism S: controlled on synthroid 45mcg  Lab Results  Component Value Date   TSH 1.60 01/29/2015  A/P: continue current medicatoin as well controlled- repeat 6 months   Return precautions advised.   Orders Placed This Encounter  Procedures  . Comprehensive metabolic panel    [place]    Standing Status: Future     Number of Occurrences:      Standing Expiration Date: 02/05/2016  . Hemoglobin A1c    [place]    Standing Status: Future     Number of Occurrences:      Standing Expiration Date: 02/05/2016  . LDL cholesterol, direct    [place]    Standing Status: Future     Number of Occurrences:      Standing Expiration Date: 02/05/2016  . TSH    [place]    Standing Status: Future     Number of Occurrences:      Standing Expiration Date: 02/05/2016    Meds ordered this encounter  Medications  . metFORMIN (GLUCOPHAGE) 500 MG tablet    Sig: Take 1 tablet (500 mg total) by mouth daily with breakfast.    Dispense:  90 tablet    Refill:  3

## 2015-02-05 NOTE — Assessment & Plan Note (Signed)
S: controlled. On Bystolic 5mg , valsartan 160mg   BP Readings from Last 3 Encounters:  02/05/15 120/64  01/31/15 124/68  01/14/15 110/68  A/P:Continue current meds:  Doing well

## 2015-02-05 NOTE — Assessment & Plan Note (Signed)
S: a1c increased to 6.3 from 6.0 previously. Had difficulty being active due to lungs A/P: start metformin 500mg  and repeat in 6 months. Discussed lifestyle changes as well but with family history in nonobese mother- wanted to be proactive

## 2015-02-05 NOTE — Assessment & Plan Note (Signed)
S: controlled on synthroid 34mcg  Lab Results  Component Value Date   TSH 1.60 01/29/2015  A/P: continue current medicatoin as well controlled- repeat 6 months

## 2015-02-05 NOTE — Progress Notes (Signed)
Pre visit review using our clinic review tool, if applicable. No additional management support is needed unless otherwise documented below in the visit note. 

## 2015-02-13 DIAGNOSIS — L900 Lichen sclerosus et atrophicus: Secondary | ICD-10-CM | POA: Diagnosis not present

## 2015-02-13 DIAGNOSIS — Z01419 Encounter for gynecological examination (general) (routine) without abnormal findings: Secondary | ICD-10-CM | POA: Diagnosis not present

## 2015-04-16 ENCOUNTER — Other Ambulatory Visit: Payer: Self-pay | Admitting: Cardiology

## 2015-04-16 ENCOUNTER — Telehealth: Payer: Self-pay | Admitting: Family Medicine

## 2015-04-16 MED ORDER — NEBIVOLOL HCL 5 MG PO TABS: 5.0000 mg | ORAL_TABLET | Freq: Every day | ORAL | Status: DC

## 2015-04-16 NOTE — Telephone Encounter (Signed)
Yes that's fine 

## 2015-04-16 NOTE — Telephone Encounter (Signed)
Pt states Dr Yong Channel put her on new DM med at last visit, and she has lost 12 lbs. However, pt is shakey and would like to come in tomorrow to discuss. Ok to use a same day?

## 2015-04-16 NOTE — Telephone Encounter (Signed)
Pt has been scheduled.  °

## 2015-04-17 ENCOUNTER — Emergency Department (HOSPITAL_COMMUNITY): Payer: Medicare Other

## 2015-04-17 ENCOUNTER — Encounter: Payer: Self-pay | Admitting: Family Medicine

## 2015-04-17 ENCOUNTER — Encounter (HOSPITAL_COMMUNITY): Payer: Self-pay | Admitting: Emergency Medicine

## 2015-04-17 ENCOUNTER — Observation Stay (HOSPITAL_COMMUNITY)
Admission: EM | Admit: 2015-04-17 | Discharge: 2015-04-19 | Disposition: A | Discharge status: Home / Self Care | Payer: Medicare Other | Attending: Internal Medicine | Admitting: Internal Medicine

## 2015-04-17 ENCOUNTER — Ambulatory Visit (INDEPENDENT_AMBULATORY_CARE_PROVIDER_SITE_OTHER): Payer: Medicare Other | Admitting: Family Medicine

## 2015-04-17 VITALS — BP 110/52 | HR 99 | Temp 99.2°F | Resp 20 | Ht 61.0 in | Wt 124.0 lb

## 2015-04-17 DIAGNOSIS — R739 Hyperglycemia, unspecified: Secondary | ICD-10-CM | POA: Diagnosis not present

## 2015-04-17 DIAGNOSIS — R9431 Abnormal electrocardiogram [ECG] [EKG]: Secondary | ICD-10-CM | POA: Diagnosis not present

## 2015-04-17 DIAGNOSIS — E785 Hyperlipidemia, unspecified: Secondary | ICD-10-CM | POA: Insufficient documentation

## 2015-04-17 DIAGNOSIS — I959 Hypotension, unspecified: Secondary | ICD-10-CM | POA: Insufficient documentation

## 2015-04-17 DIAGNOSIS — E440 Moderate protein-calorie malnutrition: Secondary | ICD-10-CM

## 2015-04-17 DIAGNOSIS — Z79899 Other long term (current) drug therapy: Secondary | ICD-10-CM | POA: Diagnosis not present

## 2015-04-17 DIAGNOSIS — J471 Bronchiectasis with (acute) exacerbation: Secondary | ICD-10-CM | POA: Diagnosis not present

## 2015-04-17 DIAGNOSIS — J111 Influenza due to unidentified influenza virus with other respiratory manifestations: Principal | ICD-10-CM | POA: Diagnosis present

## 2015-04-17 DIAGNOSIS — I10 Essential (primary) hypertension: Secondary | ICD-10-CM | POA: Diagnosis present

## 2015-04-17 DIAGNOSIS — R05 Cough: Secondary | ICD-10-CM | POA: Diagnosis not present

## 2015-04-17 DIAGNOSIS — J479 Bronchiectasis, uncomplicated: Secondary | ICD-10-CM

## 2015-04-17 DIAGNOSIS — Z7951 Long term (current) use of inhaled steroids: Secondary | ICD-10-CM | POA: Diagnosis not present

## 2015-04-17 DIAGNOSIS — Z7982 Long term (current) use of aspirin: Secondary | ICD-10-CM | POA: Diagnosis not present

## 2015-04-17 DIAGNOSIS — R69 Illness, unspecified: Secondary | ICD-10-CM

## 2015-04-17 DIAGNOSIS — F17210 Nicotine dependence, cigarettes, uncomplicated: Secondary | ICD-10-CM | POA: Diagnosis not present

## 2015-04-17 DIAGNOSIS — K219 Gastro-esophageal reflux disease without esophagitis: Secondary | ICD-10-CM | POA: Diagnosis not present

## 2015-04-17 DIAGNOSIS — I2510 Atherosclerotic heart disease of native coronary artery without angina pectoris: Secondary | ICD-10-CM | POA: Diagnosis not present

## 2015-04-17 DIAGNOSIS — R0602 Shortness of breath: Secondary | ICD-10-CM

## 2015-04-17 DIAGNOSIS — R001 Bradycardia, unspecified: Secondary | ICD-10-CM | POA: Diagnosis not present

## 2015-04-17 DIAGNOSIS — Z951 Presence of aortocoronary bypass graft: Secondary | ICD-10-CM | POA: Diagnosis not present

## 2015-04-17 DIAGNOSIS — J189 Pneumonia, unspecified organism: Secondary | ICD-10-CM

## 2015-04-17 DIAGNOSIS — E039 Hypothyroidism, unspecified: Secondary | ICD-10-CM | POA: Diagnosis not present

## 2015-04-17 DIAGNOSIS — J101 Influenza due to other identified influenza virus with other respiratory manifestations: Secondary | ICD-10-CM

## 2015-04-17 DIAGNOSIS — Z87891 Personal history of nicotine dependence: Secondary | ICD-10-CM | POA: Insufficient documentation

## 2015-04-17 DIAGNOSIS — A310 Pulmonary mycobacterial infection: Secondary | ICD-10-CM | POA: Diagnosis not present

## 2015-04-17 LAB — I-STAT CHEM 8, ED
BUN: 15 mg/dL (ref 6–20)
CALCIUM ION: 1.11 mmol/L — AB (ref 1.13–1.30)
CREATININE: 0.8 mg/dL (ref 0.44–1.00)
Chloride: 99 mmol/L — ABNORMAL LOW (ref 101–111)
Glucose, Bld: 136 mg/dL — ABNORMAL HIGH (ref 65–99)
HCT: 40 % (ref 36.0–46.0)
Hemoglobin: 13.6 g/dL (ref 12.0–15.0)
Potassium: 3.3 mmol/L — ABNORMAL LOW (ref 3.5–5.1)
Sodium: 135 mmol/L (ref 135–145)
TCO2: 22 mmol/L (ref 0–100)

## 2015-04-17 LAB — CBC WITH DIFFERENTIAL/PLATELET
Basophils Absolute: 0 10*3/uL (ref 0.0–0.1)
Basophils Relative: 0 %
Eosinophils Absolute: 0 10*3/uL (ref 0.0–0.7)
Eosinophils Relative: 0 %
HEMATOCRIT: 37.4 % (ref 36.0–46.0)
HEMOGLOBIN: 12.3 g/dL (ref 12.0–15.0)
LYMPHS ABS: 0.8 10*3/uL (ref 0.7–4.0)
Lymphocytes Relative: 14 %
MCH: 31.1 pg (ref 26.0–34.0)
MCHC: 32.9 g/dL (ref 30.0–36.0)
MCV: 94.4 fL (ref 78.0–100.0)
MONOS PCT: 7 %
Monocytes Absolute: 0.4 10*3/uL (ref 0.1–1.0)
NEUTROS ABS: 4.4 10*3/uL (ref 1.7–7.7)
NEUTROS PCT: 79 %
Platelets: 193 10*3/uL (ref 150–400)
RBC: 3.96 MIL/uL (ref 3.87–5.11)
RDW: 12.9 % (ref 11.5–15.5)
WBC: 5.6 10*3/uL (ref 4.0–10.5)

## 2015-04-17 LAB — BASIC METABOLIC PANEL
Anion gap: 12 (ref 5–15)
BUN: 16 mg/dL (ref 6–20)
CHLORIDE: 102 mmol/L (ref 101–111)
CO2: 22 mmol/L (ref 22–32)
CREATININE: 0.9 mg/dL (ref 0.44–1.00)
Calcium: 8.7 mg/dL — ABNORMAL LOW (ref 8.9–10.3)
GFR calc Af Amer: >60 mL/min (ref >60)
GFR calc non Af Amer: >60 mL/min (ref >60)
Glucose, Bld: 139 mg/dL — ABNORMAL HIGH (ref 65–99)
Potassium: 3.4 mmol/L — ABNORMAL LOW (ref 3.5–5.1)
Sodium: 136 mmol/L (ref 135–145)

## 2015-04-17 LAB — BRAIN NATRIURETIC PEPTIDE: B Natriuretic Peptide: 226.9 pg/mL — ABNORMAL HIGH (ref 0.0–100.0)

## 2015-04-17 LAB — I-STAT TROPONIN, ED: TROPONIN I, POC: 0.01 ng/mL (ref 0.00–0.08)

## 2015-04-17 LAB — CBG MONITORING, ED: Glucose-Capillary: 143 mg/dL — ABNORMAL HIGH (ref 65–99)

## 2015-04-17 LAB — MAGNESIUM: Magnesium: 2.2 mg/dL (ref 1.7–2.4)

## 2015-04-17 LAB — PHOSPHORUS: PHOSPHORUS: 2.3 mg/dL — AB (ref 2.5–4.6)

## 2015-04-17 MED ORDER — ACETAMINOPHEN 500 MG PO TABS
1000.0000 mg | ORAL_TABLET | Freq: Once | ORAL | Status: AC
  Administered 2015-04-17: 1000 mg via ORAL
  Filled 2015-04-17: qty 2

## 2015-04-17 MED ORDER — METHYLPREDNISOLONE SODIUM SUCC 125 MG IJ SOLR
125.0000 mg | Freq: Once | INTRAMUSCULAR | Status: AC
  Administered 2015-04-17: 125 mg via INTRAVENOUS
  Filled 2015-04-17: qty 2

## 2015-04-17 MED ORDER — ALBUTEROL SULFATE (2.5 MG/3ML) 0.083% IN NEBU
5.0000 mg | INHALATION_SOLUTION | Freq: Once | RESPIRATORY_TRACT | Status: AC
  Administered 2015-04-17: 5 mg via RESPIRATORY_TRACT
  Filled 2015-04-17: qty 6

## 2015-04-17 MED ORDER — POTASSIUM CHLORIDE CRYS ER 20 MEQ PO TBCR
40.0000 meq | EXTENDED_RELEASE_TABLET | Freq: Once | ORAL | Status: AC
  Administered 2015-04-17: 40 meq via ORAL
  Filled 2015-04-17: qty 2

## 2015-04-17 MED ORDER — ALBUTEROL SULFATE (2.5 MG/3ML) 0.083% IN NEBU: 2.5000 mg | INHALATION_SOLUTION | RESPIRATORY_TRACT | Status: DC | PRN

## 2015-04-17 MED ORDER — KETOROLAC TROMETHAMINE 15 MG/ML IJ SOLN
15.0000 mg | Freq: Once | INTRAMUSCULAR | Status: AC
  Administered 2015-04-17: 15 mg via INTRAVENOUS
  Filled 2015-04-17: qty 1

## 2015-04-17 MED ORDER — ACETAMINOPHEN 325 MG PO TABS: 650.0000 mg | ORAL_TABLET | Freq: Four times a day (QID) | ORAL | Status: DC | PRN

## 2015-04-17 MED ORDER — ENOXAPARIN SODIUM 40 MG/0.4ML ~~LOC~~ SOLN
40.0000 mg | SUBCUTANEOUS | Status: DC
  Administered 2015-04-17 – 2015-04-18 (×2): 40 mg via SUBCUTANEOUS
  Filled 2015-04-17 (×2): qty 0.4

## 2015-04-17 MED ORDER — IPRATROPIUM-ALBUTEROL 0.5-2.5 (3) MG/3ML IN SOLN
3.0000 mL | Freq: Three times a day (TID) | RESPIRATORY_TRACT | Status: DC
  Administered 2015-04-18 – 2015-04-19 (×3): 3 mL via RESPIRATORY_TRACT
  Filled 2015-04-17 (×4): qty 3

## 2015-04-17 MED ORDER — DEXTROSE 5 % IV SOLN
500.0000 mg | INTRAVENOUS | Status: DC
  Administered 2015-04-18: 500 mg via INTRAVENOUS
  Filled 2015-04-17: qty 500

## 2015-04-17 MED ORDER — POTASSIUM CHLORIDE IN NACL 40-0.9 MEQ/L-% IV SOLN
INTRAVENOUS | Status: AC
  Administered 2015-04-18: 50 mL/h via INTRAVENOUS
  Filled 2015-04-17: qty 1000

## 2015-04-17 MED ORDER — ACETAMINOPHEN 325 MG PO TABS
650.0000 mg | ORAL_TABLET | Freq: Once | ORAL | Status: AC | PRN
  Administered 2015-04-17: 650 mg via ORAL
  Filled 2015-04-17: qty 2

## 2015-04-17 MED ORDER — IPRATROPIUM-ALBUTEROL 0.5-2.5 (3) MG/3ML IN SOLN
3.0000 mL | Freq: Four times a day (QID) | RESPIRATORY_TRACT | Status: DC
  Administered 2015-04-17: 3 mL via RESPIRATORY_TRACT
  Filled 2015-04-17: qty 3

## 2015-04-17 MED ORDER — POTASSIUM CHLORIDE 10 MEQ/100ML IV SOLN
10.0000 meq | INTRAVENOUS | Status: AC
  Administered 2015-04-18: 10 meq via INTRAVENOUS
  Filled 2015-04-17: qty 100

## 2015-04-17 MED ORDER — DEXTROSE 5 % IV SOLN
1.0000 g | INTRAVENOUS | Status: DC
  Administered 2015-04-17: 1 g via INTRAVENOUS
  Filled 2015-04-17: qty 10

## 2015-04-17 MED ORDER — SODIUM CHLORIDE 0.9% FLUSH
3.0000 mL | Freq: Two times a day (BID) | INTRAVENOUS | Status: DC
  Administered 2015-04-17 – 2015-04-19 (×4): 3 mL via INTRAVENOUS

## 2015-04-17 MED ORDER — AMOXICILLIN 875 MG PO TABS: 875.0000 mg | ORAL_TABLET | Freq: Two times a day (BID) | ORAL | Status: DC

## 2015-04-17 MED ORDER — GUAIFENESIN ER 600 MG PO TB12
600.0000 mg | ORAL_TABLET | Freq: Two times a day (BID) | ORAL | Status: DC
  Administered 2015-04-17 – 2015-04-19 (×4): 600 mg via ORAL
  Filled 2015-04-17 (×4): qty 1

## 2015-04-17 MED ORDER — ONDANSETRON HCL 4 MG/2ML IJ SOLN
4.0000 mg | Freq: Once | INTRAMUSCULAR | Status: AC
  Administered 2015-04-17: 4 mg via INTRAVENOUS
  Filled 2015-04-17: qty 2

## 2015-04-17 MED ORDER — ALPRAZOLAM 0.25 MG PO TABS
0.2500 mg | ORAL_TABLET | Freq: Every evening | ORAL | Status: DC | PRN
  Administered 2015-04-18 (×2): 0.25 mg via ORAL
  Filled 2015-04-17 (×2): qty 1

## 2015-04-17 MED ORDER — MAGNESIUM SULFATE 4 GM/100ML IV SOLN
4.0000 g | Freq: Once | INTRAVENOUS | Status: AC
  Administered 2015-04-17: 4 g via INTRAVENOUS
  Filled 2015-04-17: qty 100

## 2015-04-17 MED ORDER — IPRATROPIUM-ALBUTEROL 0.5-2.5 (3) MG/3ML IN SOLN
3.0000 mL | RESPIRATORY_TRACT | Status: AC
  Administered 2015-04-17: 3 mL via RESPIRATORY_TRACT
  Filled 2015-04-17: qty 3

## 2015-04-17 MED ORDER — AZITHROMYCIN 250 MG PO TABS: ORAL_TABLET | ORAL | Status: DC

## 2015-04-17 MED ORDER — IOHEXOL 350 MG/ML SOLN
100.0000 mL | Freq: Once | INTRAVENOUS | Status: AC | PRN
  Administered 2015-04-17: 100 mL via INTRAVENOUS

## 2015-04-17 MED ORDER — ENSURE ENLIVE PO LIQD
237.0000 mL | Freq: Two times a day (BID) | ORAL | Status: DC
  Administered 2015-04-18 – 2015-04-19 (×3): 237 mL via ORAL

## 2015-04-17 NOTE — ED Provider Notes (Addendum)
CSN: [id number]     Arrival date & time 04/17/15  1636 History   First MD Initiated Contact with Patient 04/17/15 1739     Chief Complaint  Patient presents with  . Cough  . Shortness of Breath     (Consider location/radiation/quality/duration/timing/severity/associated sxs/prior Treatment) Patient is a 70 y.o. female presenting with cough, shortness of breath, and general illness. The history is provided by the patient.  Cough Associated symptoms: chills, fever and shortness of breath (not significantly changed from baseline)   Associated symptoms: no chest pain, no headaches, no myalgias, no rhinorrhea and no wheezing   Shortness of Breath Associated symptoms: cough and fever   Associated symptoms: no chest pain, no headaches, no vomiting and no wheezing   Illness Severity:  Severe Onset quality:  Gradual Duration:  1 week Timing:  Constant Progression:  Unchanged Chronicity:  New Associated symptoms: cough, fatigue, fever and shortness of breath (not significantly changed from baseline)   Associated symptoms: no chest pain, no congestion, no headaches, no myalgias, no nausea, no rhinorrhea, no vomiting and no wheezing    70 yo F With a chief complaints of cough congestion fevers and chills. This been going on for about a week. Is getting worse. Patient went to see her pulmonologist today who is concerned about how she looked and sent  her here for evaluation. Patient denies any chest pain. Feels that her breathing is about at her baseline. Denies any lower externally swelling. Denies any PE risk factors.  Past Medical History  Diagnosis Date  . Bronchiectasis     oxygen at night in the past  . MAI (mycobacterium avium-intracellulare) ([place])   . CAD (coronary artery disease)   . Hyperlipidemia   . HTN (hypertension)   . GERD (gastroesophageal reflux disease)   . History of shingles 04/2012  . Hypothyroidism   . Neuritis of upper extremity   . Diverticulitis 2014   Past  Surgical History  Procedure Laterality Date  . Triple bypass  2004    x3 CABG   Family History  Problem Relation Age of Onset  . Colon cancer Mother     and father  . Hyperlipidemia Mother   . Hypertension Mother   . Heart disease Mother   . Stroke Mother   . Diabetes Mother   . Colon cancer Father   . Arthritis Father   . Hyperlipidemia Father   . Hypertension Father   . Heart disease Father   . Stroke Father   . Atopy Neg Hx    Social History  Substance Use Topics  . Smoking status: Former Smoker -- 1.00 packs/day for 20 years    Types: Cigarettes    Quit date: 01/25/1970  . Smokeless tobacco: Never Used  . Alcohol Use: No   OB History    No data available     Review of Systems  Constitutional: Positive for fever, chills and fatigue.  HENT: Negative for congestion and rhinorrhea.   Eyes: Negative for redness and visual disturbance.  Respiratory: Positive for cough and shortness of breath (not significantly changed from baseline). Negative for wheezing.   Cardiovascular: Negative for chest pain and palpitations.  Gastrointestinal: Negative for nausea and vomiting.  Genitourinary: Negative for dysuria and urgency.  Musculoskeletal: Negative for myalgias and arthralgias.  Skin: Negative for pallor and wound.  Neurological: Negative for dizziness and headaches.      Allergies  Bactrim; Ciprofloxacin; Codeine; Erythromycin; and Levofloxacin  Home Medications   Prior to Admission  medications   Medication Sig Start Date End Date Taking? Authorizing Provider  acetaminophen (TYLENOL) 650 MG CR tablet every 8 (eight) hours as needed for pain.    Yes Historical Provider, MD  albuterol (PROAIR HFA) 108 (90 BASE) MCG/ACT inhaler Inhale 2 puffs into the lungs every 6 (six) hours as needed for wheezing or shortness of breath. 10/15/14  Yes Tanda Rockers, MD  ALPRAZolam Duanne Moron) 0.25 MG tablet take 1 tablet by mouth at bedtime if needed for sleep 10/14/14  Yes Marin Olp, MD  amitriptyline (ELAVIL) 50 MG tablet Take 1 tablet (50 mg total) by mouth at bedtime. 08/05/14  Yes Marin Olp, MD  aspirin 81 MG tablet Take 81 mg by mouth daily.    Yes Historical Provider, MD  atorvastatin (LIPITOR) 80 MG tablet Take 1 tablet (80 mg total) by mouth daily. 01/17/15  Yes Lelon Perla, MD  budesonide-formoterol (SYMBICORT) 160-4.5 MCG/ACT inhaler Inhale 2 puffs into the lungs 2 (two) times daily. 10/15/14  Yes Tanda Rockers, MD  guaiFENesin (MUCINEX) 600 MG 12 hr tablet Take 600 mg by mouth 2 (two) times daily.   Yes Historical Provider, MD  nebivolol (BYSTOLIC) 5 MG tablet Take 1 tablet (5 mg total) by mouth daily. 04/16/15  Yes Lelon Perla, MD  SYNTHROID 75 MCG tablet One tab by mouth once daily 6 days a week. 1.5 tab on 7th day 08/05/14  Yes Marin Olp, MD  valsartan (DIOVAN) 160 MG tablet take 1 tablet by mouth once daily 01/09/15  Yes Lelon Perla, MD  amoxicillin (AMOXIL) 875 MG tablet Take 1 tablet (875 mg total) by mouth 2 (two) times daily. 04/17/15   Marin Olp, MD  azithromycin (ZITHROMAX) 250 MG tablet Take 2 tabs on day 1, then 1 tab daily until finished 04/17/15   Marin Olp, MD  meclizine (ANTIVERT) 25 MG tablet Take 1 tablet (25 mg total) by mouth 3 (three) times daily as needed for dizziness or nausea. Patient not taking: Reported on 04/17/2015 04/18/13   Brunetta Jeans, PA-C  Respiratory Therapy Supplies (FLUTTER) DEVI Use as directed 09/03/14   Tanda Rockers, MD   BP 111/45 mmHg  Pulse 83  Temp(Src) 100 F (37.8 C) (Oral)  Resp 16  Ht 5\' 1"  (1.549 m)  Wt 129 lb 3 oz (58.6 kg)  BMI 24.42 kg/m2  SpO2 96% Physical Exam  Constitutional: She is oriented to person, place, and time. She appears well-developed and well-nourished. No distress.  HENT:  Head: Normocephalic and atraumatic.  Swollen turbinates, posterior nasal drip, no noted sinus ttp, tm normal bilaterally.    Eyes: EOM are normal. Pupils are equal,  round, and reactive to light.  Neck: Normal range of motion. Neck supple.  Cardiovascular: Normal rate and regular rhythm.  Exam reveals no gallop and no friction rub.   No murmur heard. Pulmonary/Chest: Effort normal. She has wheezes (diffuse, end expiratory). She has no rales.  Abdominal: Soft. She exhibits no distension. There is no tenderness.  Musculoskeletal: She exhibits no edema or tenderness.  Neurological: She is alert and oriented to person, place, and time.  Skin: Skin is warm and dry. She is not diaphoretic.  Psychiatric: She has a normal mood and affect. Her behavior is normal.  Nursing note and vitals reviewed.   ED Course  Procedures (including critical care time) Labs Review Labs Reviewed  BRAIN NATRIURETIC PEPTIDE - Abnormal; Notable for the following:    B Natriuretic  Peptide 226.9 (*)    All other components within normal limits  BASIC METABOLIC PANEL - Abnormal; Notable for the following:    Potassium 3.4 (*)    Glucose, Bld 139 (*)    Calcium 8.7 (*)    All other components within normal limits  PHOSPHORUS - Abnormal; Notable for the following:    Phosphorus 2.3 (*)    All other components within normal limits  I-STAT CHEM 8, ED - Abnormal; Notable for the following:    Potassium 3.3 (*)    Chloride 99 (*)    Glucose, Bld 136 (*)    Calcium, Ion 1.11 (*)    All other components within normal limits  CBG MONITORING, ED - Abnormal; Notable for the following:    Glucose-Capillary 143 (*)    All other components within normal limits  CULTURE, BLOOD (ROUTINE X 2)  CULTURE, BLOOD (ROUTINE X 2)  CULTURE, EXPECTORATED SPUTUM-ASSESSMENT  GRAM STAIN  CBC WITH DIFFERENTIAL/PLATELET  MAGNESIUM  INFLUENZA PANEL BY PCR (TYPE A & B, H1N1)  STREP PNEUMONIAE URINARY ANTIGEN  CBC WITH DIFFERENTIAL/PLATELET  COMPREHENSIVE METABOLIC PANEL  LEGIONELLA PNEUMOPHILA SEROGP 1 UR AG  I-STAT TROPOININ, ED    Imaging Review Dg Chest 2 View  04/17/2015  CLINICAL DATA:   Productive cough, shortness of breath for 3 days EXAM: CHEST  2 VIEW COMPARISON:  09/03/2014 FINDINGS: Cardiomediastinal silhouette is stable. Mild hyperinflation again noted. Stable streaky scarring in right perihilar region and lingula. No definite superimposed infiltrate or pulmonary edema. Status post CABG. Osteopenia and mild degenerative changes thoracic spine. IMPRESSION: No active disease. Mild hyperinflation again noted. Stable streaky scarring right perihilar and lingula. No definite superimposed infiltrate. Electronically Signed   By: Lahoma Crocker M.D.   On: 04/17/2015 17:03   Ct Angio Chest Pe W/cm &/or Wo Cm  04/17/2015  CLINICAL DATA:  Shortness of breath. Cold and congestion for 3 days. EXAM: CT ANGIOGRAPHY CHEST WITH CONTRAST TECHNIQUE: Multidetector CT imaging of the chest was performed using the standard protocol during bolus administration of intravenous contrast. Multiplanar CT image reconstructions and MIPs were obtained to evaluate the vascular anatomy. CONTRAST:  164mL OMNIPAQUE IOHEXOL 350 MG/ML SOLN COMPARISON:  Chest radiographs 04/17/2015 and CT 08/03/2007 FINDINGS: There is no evidence of pulmonary arterial emboli. The right middle lobe pulmonary artery is not well seen and may be small due to chronic parenchymal middle lobe lung disease. Sequelae of prior CABG are identified. Heart is within normal limits for size. Aortic and coronary artery calcification is noted. A right paratracheal node is minimally larger than on the prior CT, measuring 10 mm in short axis (previously 9 mm). No enlarged hilar lymph nodes are identified. There is a trace right pleural effusion. Bronchial wall thickening and bronchiectasis are again seen involving the right greater than left lungs. The cavitary right upper lobe nodule on the prior CT has decreased in size, now measuring 1.7 x 1.5 cm and likely reflecting scarring associated with bronchiectasis. Extensive bronchiectasis and volume loss throughout the  right middle lobe are similar to the prior CT. There is a 1.3 cm nodular opacity in the right lower lobe at the site of the prior 3.8 cm cavitary nodule, likely reflecting residual scarring or focal atelectasis. Right lower lobe bronchiectasis is similar to the prior CT. Tiny nodular densities throughout the right lower and right upper lobes are again seen, some of which are tree-in-bud in configuration. Mild atelectasis is present in the basilar right lower lobe, and there is  also mild chronic lingular volume loss. Minimal basilar left lower lobe atelectasis is noted. Scattered calcifications are again seen throughout both lungs. No acute abnormality is identified in the visualized upper portion of the abdomen. No acute osseous abnormality is identified. Review of the MIP images confirms the above findings. IMPRESSION: 1. No evidence of acute pulmonary emboli. 2. Trace right pleural effusion. 3. Marked chronic bronchiectasis and volume loss in the right middle lobe with milder bronchiectasis, bronchial wall thickening, and nodular densities throughout the right upper and right lower lobe suggestive of chronic endobronchial/atypical mycobacterial infection. Electronically Signed   By: Logan Bores M.D.   On: 04/17/2015 19:31   I have personally reviewed and evaluated these images and lab results as part of my medical decision-making.   EKG Interpretation   Date/Time:  Thursday April 17 2015 17:33:41 EDT Ventricular Rate:  114 PR Interval:  113 QRS Duration: 86 QT Interval:  334 QTC Calculation: 460 R Axis:   68 Text Interpretation:  Sinus tachycardia Abnormal T, consider ischemia,  diffuse leads diffuse t wave inversions with st depression. ?avr elevation  Confirmed by Mikenzie Mccannon MD, DANIEL [id number]) on 04/17/2015 5:38:03 PM      MDM   Final diagnoses:  SOB (shortness of breath)  Influenza-like illness  Abnormal EKG    70 yo F with a chief complaint of flulike symptoms. Patient's history  consistent with the same. Patient has an EKG that was concerning for diffuse ST depressions. Denies any chest pain or shortness of breath. Symptoms are completely atypical of an MI. No stroke like symptoms. Troponins negative. PE study was ordered and negative as well. Will admit to the hospitalist for EKG changes.  The patients results and plan were reviewed and discussed.   Any x-rays performed were independently reviewed by myself.   Differential diagnosis were considered with the presenting HPI.  CRITICAL CARE Performed by: Cecilio Asper   Total critical care time: 30 minutes  Critical care time was exclusive of separately billable procedures and treating other patients.  Critical care was necessary to treat or prevent imminent or life-threatening deterioration.  Critical care was time spent personally by me on the following activities: development of treatment plan with patient and/or surrogate as well as nursing, discussions with consultants, evaluation of patient's response to treatment, examination of patient, obtaining history from patient or surrogate, ordering and performing treatments and interventions, ordering and review of laboratory studies, ordering and review of radiographic studies, pulse oximetry and re-evaluation of patient's condition.   Medications  ipratropium-albuterol (DUONEB) 0.5-2.5 (3) MG/3ML nebulizer solution 3 mL (3 mLs Nebulization Given 04/17/15 1834)  acetaminophen (TYLENOL) tablet 650 mg (not administered)  enoxaparin (LOVENOX) injection 40 mg (40 mg Subcutaneous Given 04/17/15 2207)  sodium chloride flush (NS) 0.9 % injection 3 mL (3 mLs Intravenous Given 04/17/15 2207)  cefTRIAXone (ROCEPHIN) 1 g in dextrose 5 % 50 mL IVPB (1 g Intravenous Given 04/17/15 2207)  azithromycin (ZITHROMAX) 500 mg in dextrose 5 % 250 mL IVPB (not administered)  albuterol (PROVENTIL) (2.5 MG/3ML) 0.083% nebulizer solution 2.5 mg (not administered)  guaiFENesin (MUCINEX)  12 hr tablet 600 mg (600 mg Oral Given 04/17/15 2207)  magnesium sulfate IVPB 4 g 100 mL (not administered)  ALPRAZolam (XANAX) tablet 0.25 mg (not administered)  feeding supplement (ENSURE ENLIVE) (ENSURE ENLIVE) liquid 237 mL (not administered)  ipratropium-albuterol (DUONEB) 0.5-2.5 (3) MG/3ML nebulizer solution 3 mL (not administered)  potassium chloride 10 mEq in 100 mL IVPB (not administered)  0.9 %  NaCl with KCl 40 mEq / L  infusion (not administered)  albuterol (PROVENTIL) (2.5 MG/3ML) 0.083% nebulizer solution 5 mg (5 mg Nebulization Given 04/17/15 1719)  acetaminophen (TYLENOL) tablet 650 mg (650 mg Oral Given 04/17/15 1718)  methylPREDNISolone sodium succinate (SOLU-MEDROL) 125 mg/2 mL injection 125 mg (125 mg Intravenous Given 04/17/15 1834)  iohexol (OMNIPAQUE) 350 MG/ML injection 100 mL (100 mLs Intravenous Contrast Given 04/17/15 1902)  potassium chloride SA (K-DUR,KLOR-CON) CR tablet 40 mEq (40 mEq Oral Given 04/17/15 1924)  ondansetron (ZOFRAN) injection 4 mg (4 mg Intravenous Given 04/17/15 1935)  acetaminophen (TYLENOL) tablet 1,000 mg (1,000 mg Oral Given 04/17/15 2033)  ketorolac (TORADOL) 15 MG/ML injection 15 mg (15 mg Intravenous Given 04/17/15 2207)    Filed Vitals:   04/17/15 1935 04/17/15 2030 04/17/15 2110 04/17/15 2213  BP:  100/51 111/45   Pulse:  99 83   Temp: 100.3 F (37.9 C)  100 F (37.8 C)   TempSrc: Oral  Oral   Resp:   16   Height:   5\' 1"  (1.549 m)   Weight:   129 lb 3 oz (58.6 kg)   SpO2:  99% 98% 96%    Final diagnoses:  SOB (shortness of breath)  Influenza-like illness  Abnormal EKG    Admission/ observation were discussed with the admitting physician, patient and/or family and they are comfortable with the plan.      Deno Etienne, DO 04/17/15 [place], DO 04/17/15 2246

## 2015-04-17 NOTE — Progress Notes (Signed)
Garret Reddish, MD  Subjective:  Belinda Day is a 70 y.o. year old very pleasant female patient who presents for/with See problem oriented charting ROS- some subjective warmth and has had some chills, no nausea or vomiting. Does have some shortness of breath but no chest pain.   Past Medical History-  Patient Active Problem List   Diagnosis Date Noted  . CAD (coronary artery disease) s/p CABG 07/03/2007    Priority: High  . Hyperglycemia 08/05/2014    Priority: Medium  . Insomnia 02/12/2013    Priority: Medium  . Nocturnal hypoxemia 12/12/2012    Priority: Medium  . Fibromyalgia 12/28/2011    Priority: Medium  . Hypothyroidism 04/06/2010    Priority: Medium  . MAI (mycobacterium avium-intracellulare) ([place]) 11/19/2009    Priority: Medium  . Hyperlipemia 07/03/2007    Priority: Medium  . Essential hypertension 07/03/2007    Priority: Medium  . Obstructive bronchiectasis ([place]) 07/03/2007    Priority: Medium  . Former smoker 08/05/2014    Priority: Low  . Constipation 05/02/2014    Priority: Low  . History of colonic polyps 05/02/2014    Priority: Low  . GERD (gastroesophageal reflux disease) 02/24/2014    Priority: Low  . Hemoptysis 02/24/2014    Priority: Low  . Benign paroxysmal positional vertigo 04/18/2013    Priority: Low  . Diverticulitis 12/21/2012    Priority: Low  . Osteopenia 08/14/2011    Priority: Low  . Cerebrovascular disease 01/31/2015    Medications- reviewed and updated Current Outpatient Prescriptions  Medication Sig Dispense Refill  . acetaminophen (TYLENOL) 650 MG CR tablet every 8 (eight) hours as needed for pain.     Marland Kitchen albuterol (PROAIR HFA) 108 (90 BASE) MCG/ACT inhaler Inhale 2 puffs into the lungs every 6 (six) hours as needed for wheezing or shortness of breath. 1 Inhaler 11  . ALPRAZolam (XANAX) 0.25 MG tablet take 1 tablet by mouth at bedtime if needed for sleep 60 tablet 4  . amitriptyline (ELAVIL) 50 MG tablet Take 1 tablet (50 mg  total) by mouth at bedtime. 90 tablet 3  . aspirin 81 MG tablet Take 81 mg by mouth daily.     Marland Kitchen atorvastatin (LIPITOR) 80 MG tablet Take 1 tablet (80 mg total) by mouth daily. 90 tablet 3  . budesonide-formoterol (SYMBICORT) 160-4.5 MCG/ACT inhaler Inhale 2 puffs into the lungs 2 (two) times daily. 10.2 g 11  . meclizine (ANTIVERT) 25 MG tablet Take 1 tablet (25 mg total) by mouth 3 (three) times daily as needed for dizziness or nausea. 30 tablet 3  . nebivolol (BYSTOLIC) 5 MG tablet Take 1 tablet (5 mg total) by mouth daily. 90 tablet 3  . Respiratory Therapy Supplies (FLUTTER) DEVI Use as directed 1 each 0  . SYNTHROID 75 MCG tablet One tab by mouth once daily 6 days a week. 1.5 tab on 7th day 95 tablet 3  . valsartan (DIOVAN) 160 MG tablet take 1 tablet by mouth once daily 90 tablet 3  . amoxicillin (AMOXIL) 875 MG tablet Take 1 tablet (875 mg total) by mouth 2 (two) times daily. 14 tablet 0  . azithromycin (ZITHROMAX) 250 MG tablet Take 2 tabs on day 1, then 1 tab daily until finished 6 tablet 0   No current facility-administered medications for this visit.    Objective: BP 110/52 mmHg  Pulse 99  Temp(Src) 99.2 F (37.3 C) (Oral)  Resp 20  Ht 5\' 1"  (1.549 m)  Wt 124 lb (56.246 kg)  BMI 23.44 kg/m2  SpO2 95% Gen: NAD, resting comfortably Mild rhinorrhea, Mucous membranes are moist, oropharynx normal CV: RRR no murmurs rubs or gallops Lungs: crackles in RUL and RLL with no wheezes or rhonchi. No respiratory distress Abdomen: soft/nontender/nondistended/normal bowel sounds. No rebound or guarding.  Ext: no edema Skin: warm, dry Neuro: grossly normal, moves all extremities  Assessment/Plan:  Hyperglycemia S: likely over controlled On metformin 500mg  daily. Shakiness for about last month tends to occur before meals. Symptoms get better with eating. Eliminated all additional sugars- cakes, sugars, ice cream and eating low carb- has lost 16 lbs. Admits appetite lower due to  metformin so thankful for its aid Lab Results  Component Value Date   HGBA1C 6.3 01/29/2015   HGBA1C 6.0 07/31/2014   HGBA1C 5.9* 05/28/2013   A/P: suspect hypoglycemia as cause. Stop metformin- encouraged regular meals and snacking if symptomatic again. Suspect drastic improvement due to 16 lbs weight loss and avoidance of sugars and carbs   Obstructive bronchiectasis ([facility]) S:2-3 days body aches, congestion, some cough. Fatigue. Mild shortness of breath. Compliant with her symbicort. Having to use albuterol some more. Usually uses azithromycin in similar situations A/P: This is typical for patient when she needs azithromycin. I am also concerned with her crackles in RLL and RUL for potential CAP so will treat with amoxicillin as well. Return precautions discussed for after hours care, Dr. Melvyn Novas, and myself. High risk patient due to bronchiectasis history   She will get fasting labs in a few months. Suspect a1c will be lower despite no metformin at that time given weight loss.   Meds ordered this encounter  Medications  . amoxicillin (AMOXIL) 875 MG tablet    Sig: Take 1 tablet (875 mg total) by mouth 2 (two) times daily.    Dispense:  14 tablet    Refill:  0  . azithromycin (ZITHROMAX) 250 MG tablet    Sig: Take 2 tabs on day 1, then 1 tab daily until finished    Dispense:  6 tablet    Refill:  0

## 2015-04-17 NOTE — ED Notes (Signed)
Per EMS, cold and congestion x 3 days, saw pulmonologist today and was given a z-pack, rescue inhaler, amoxicillin. She got home, had increased SOB, her neighbor convinced her to come in. No x-ray was done in office, she was told to come back Monday if her symptoms haven't improved after antibiotic treatment.

## 2015-04-17 NOTE — ED Notes (Signed)
Hospitalist at bedside 

## 2015-04-17 NOTE — ED Notes (Signed)
Pt vomited approximately 8 oz directly following Potassium administration

## 2015-04-17 NOTE — H&P (Signed)
[facility] History and Physical  BIJOUX ROSSMILLER [id number] DOB: 1945/11/11 DOA: 04/17/2015  Referring physician: Deno Etienne, DO PCP: Garret Reddish, MD   Chief Complaint: Shortness of breath.  HPI: Belinda Day is a 70 y.o. female with a past medical history of bronchiectasis, MAI (mycobacterium avium-intracellulare) ([place]), CAD, hyperlipidemia, hypertension, GERD, hypothyroidism who is coming to the emergency department due to worsening of shortness of breath.  Per patient, she has been having URI symptoms for the past 3 days. She saw her pulmonologist today who prescribed her a Z-Pak, and inhaler and amoxicillin. However when she got home she had increased dyspnea and decided to come home after she spoke to one of her neighbors who was concerned about her symptoms.   When seen in the emergency department, the patient was in no acute distress. She states that she feels a lot better after receiving treatment.Workup in the emergency department shows severe bronchiectasis on CT scan of the chest, mild hypokalemia and an EKG with diffuse T-wave inversions. However, the patient's denies anychest pain, palpitations, dizziness, diaphoresis, PND, orthopnea or pitting edema of the lower extremities.    Review of Systems:  Constitutional:  Positive Fevers, chills, fatigue.  No weight loss, night sweats,  HEENT:  No headaches, Difficulty swallowing,Tooth/dental problems,Sore throat,  No sneezing, itching, ear ache, nasal congestion, post nasal drip,  Cardio-vascular:  No chest pain, Orthopnea, PND, swelling in lower extremities, anasarca, dizziness, palpitations  GI:  No heartburn, indigestion, abdominal pain, nausea, vomiting, diarrhea, change in bowel habits, loss of appetite  Resp:  As above mentioned. Skin:  no rash or lesions.  GU:  no dysuria, change in color of urine, no urgency or frequency. No flank pain.  Musculoskeletal:  No joint pain or swelling. No decreased  range of motion. No back pain.  Psych:  No change in mood or affect. No depression or anxiety. No memory loss.   Past Medical History  Diagnosis Date  . Bronchiectasis     oxygen at night in the past  . MAI (mycobacterium avium-intracellulare) ([place])   . CAD (coronary artery disease)   . Hyperlipidemia   . HTN (hypertension)   . GERD (gastroesophageal reflux disease)   . History of shingles 04/2012  . Hypothyroidism   . Neuritis of upper extremity   . Diverticulitis 2014   Past Surgical History  Procedure Laterality Date  . Triple bypass  2004    x3 CABG   Social History:  reports that she quit smoking about 45 years ago. Her smoking use included Cigarettes. She has a 20 pack-year smoking history. She has never used smokeless tobacco. She reports that she does not drink alcohol or use illicit drugs.  Allergies  Allergen Reactions  . Bactrim [Sulfamethoxazole-Trimethoprim] Hives, Itching and Other (See Comments)    Bruised like areas on body  . Ciprofloxacin Other (See Comments)    Body aches  . Codeine Nausea Only    REACTION: nausea  . Erythromycin Nausea Only    REACTION: nausea  . Levofloxacin Other (See Comments)    REACTION: aches    Family History  Problem Relation Age of Onset  . Colon cancer Mother     and father  . Hyperlipidemia Mother   . Hypertension Mother   . Heart disease Mother   . Stroke Mother   . Diabetes Mother   . Colon cancer Father   . Arthritis Father   . Hyperlipidemia Father   . Hypertension Father   .  Heart disease Father   . Stroke Father   . Atopy Neg Hx     Prior to Admission medications   Medication Sig Start Date End Date Taking? Authorizing Provider  acetaminophen (TYLENOL) 650 MG CR tablet every 8 (eight) hours as needed for pain.    Yes Historical Provider, MD  albuterol (PROAIR HFA) 108 (90 BASE) MCG/ACT inhaler Inhale 2 puffs into the lungs every 6 (six) hours as needed for wheezing or shortness of breath. 10/15/14  Yes  Tanda Rockers, MD  ALPRAZolam Duanne Moron) 0.25 MG tablet take 1 tablet by mouth at bedtime if needed for sleep 10/14/14  Yes Marin Olp, MD  amitriptyline (ELAVIL) 50 MG tablet Take 1 tablet (50 mg total) by mouth at bedtime. 08/05/14  Yes Marin Olp, MD  aspirin 81 MG tablet Take 81 mg by mouth daily.    Yes Historical Provider, MD  atorvastatin (LIPITOR) 80 MG tablet Take 1 tablet (80 mg total) by mouth daily. 01/17/15  Yes Lelon Perla, MD  budesonide-formoterol (SYMBICORT) 160-4.5 MCG/ACT inhaler Inhale 2 puffs into the lungs 2 (two) times daily. 10/15/14  Yes Tanda Rockers, MD  guaiFENesin (MUCINEX) 600 MG 12 hr tablet Take 600 mg by mouth 2 (two) times daily.   Yes Historical Provider, MD  nebivolol (BYSTOLIC) 5 MG tablet Take 1 tablet (5 mg total) by mouth daily. 04/16/15  Yes Lelon Perla, MD  SYNTHROID 75 MCG tablet One tab by mouth once daily 6 days a week. 1.5 tab on 7th day 08/05/14  Yes Marin Olp, MD  valsartan (DIOVAN) 160 MG tablet take 1 tablet by mouth once daily 01/09/15  Yes Lelon Perla, MD  amoxicillin (AMOXIL) 875 MG tablet Take 1 tablet (875 mg total) by mouth 2 (two) times daily. 04/17/15   Marin Olp, MD  azithromycin (ZITHROMAX) 250 MG tablet Take 2 tabs on day 1, then 1 tab daily until finished 04/17/15   Marin Olp, MD  meclizine (ANTIVERT) 25 MG tablet Take 1 tablet (25 mg total) by mouth 3 (three) times daily as needed for dizziness or nausea. Patient not taking: Reported on 04/17/2015 04/18/13   Brunetta Jeans, PA-C  Respiratory Therapy Supplies (FLUTTER) DEVI Use as directed 09/03/14   Tanda Rockers, MD   Physical Exam: Filed Vitals:   04/17/15 1935 04/17/15 2030 04/17/15 2110 04/17/15 2213  BP:  100/51 111/45   Pulse:  99 83   Temp: 100.3 F (37.9 C)  100 F (37.8 C)   TempSrc: Oral  Oral   Resp:   16   Height:   5\' 1"  (1.549 m)   Weight:   58.6 kg (129 lb 3 oz)   SpO2:  99% 98% 96%    Wt Readings from Last 3 Encounters:   04/17/15 58.6 kg (129 lb 3 oz)  04/17/15 56.246 kg (124 lb)  02/05/15 63.504 kg (140 lb)    General:  Appears calm and comfortable Eyes: PERRL, normal lids, irises & conjunctiva ENT: grossly normal hearing, lips & tongue Neck: no LAD, masses or thyromegaly Cardiovascular: RRR, no m/r/g. No LE edema. Telemetry: SR, no arrhythmias  Respiratory: Bilateral wheezing and rhonchi. No crackles. No accessory muscle use. Abdomen: soft, ntnd Skin: no rash or induration seen on limited exam Musculoskeletal: grossly normal tone BUE/BLE Psychiatric: grossly normal mood and affect, speech fluent and appropriate Neurologic: Awake, alert, oriented 4, grossly non-focal.          Labs on  Admission:  Basic Metabolic Panel:  Recent Labs Lab 04/17/15 1810 04/17/15 1828  NA 136 135  K 3.4* 3.3*  CL 102 99*  CO2 22  --   GLUCOSE 139* 136*  BUN 16 15  CREATININE 0.90 0.80  CALCIUM 8.7*  --   MG 2.2  --   PHOS 2.3*  --    CBC:  Recent Labs Lab 04/17/15 1810 04/17/15 1828  WBC 5.6  --   NEUTROABS 4.4  --   HGB 12.3 13.6  HCT 37.4 40.0  MCV 94.4  --   PLT 193  --     BNP (last 3 results)  Recent Labs  04/17/15 1810  BNP 226.9*    CBG:  Recent Labs Lab 04/17/15 1930  GLUCAP 143*    Radiological Exams on Admission: Dg Chest 2 View  04/17/2015  CLINICAL DATA:  Productive cough, shortness of breath for 3 days EXAM: CHEST  2 VIEW COMPARISON:  09/03/2014 FINDINGS: Cardiomediastinal silhouette is stable. Mild hyperinflation again noted. Stable streaky scarring in right perihilar region and lingula. No definite superimposed infiltrate or pulmonary edema. Status post CABG. Osteopenia and mild degenerative changes thoracic spine. IMPRESSION: No active disease. Mild hyperinflation again noted. Stable streaky scarring right perihilar and lingula. No definite superimposed infiltrate. Electronically Signed   By: Lahoma Crocker M.D.   On: 04/17/2015 17:03   Ct Angio Chest Pe W/cm &/or Wo  Cm  04/17/2015  CLINICAL DATA:  Shortness of breath. Cold and congestion for 3 days. EXAM: CT ANGIOGRAPHY CHEST WITH CONTRAST TECHNIQUE: Multidetector CT imaging of the chest was performed using the standard protocol during bolus administration of intravenous contrast. Multiplanar CT image reconstructions and MIPs were obtained to evaluate the vascular anatomy. CONTRAST:  129mL OMNIPAQUE IOHEXOL 350 MG/ML SOLN COMPARISON:  Chest radiographs 04/17/2015 and CT 08/03/2007 FINDINGS: There is no evidence of pulmonary arterial emboli. The right middle lobe pulmonary artery is not well seen and may be small due to chronic parenchymal middle lobe lung disease. Sequelae of prior CABG are identified. Heart is within normal limits for size. Aortic and coronary artery calcification is noted. A right paratracheal node is minimally larger than on the prior CT, measuring 10 mm in short axis (previously 9 mm). No enlarged hilar lymph nodes are identified. There is a trace right pleural effusion. Bronchial wall thickening and bronchiectasis are again seen involving the right greater than left lungs. The cavitary right upper lobe nodule on the prior CT has decreased in size, now measuring 1.7 x 1.5 cm and likely reflecting scarring associated with bronchiectasis. Extensive bronchiectasis and volume loss throughout the right middle lobe are similar to the prior CT. There is a 1.3 cm nodular opacity in the right lower lobe at the site of the prior 3.8 cm cavitary nodule, likely reflecting residual scarring or focal atelectasis. Right lower lobe bronchiectasis is similar to the prior CT. Tiny nodular densities throughout the right lower and right upper lobes are again seen, some of which are tree-in-bud in configuration. Mild atelectasis is present in the basilar right lower lobe, and there is also mild chronic lingular volume loss. Minimal basilar left lower lobe atelectasis is noted. Scattered calcifications are again seen throughout  both lungs. No acute abnormality is identified in the visualized upper portion of the abdomen. No acute osseous abnormality is identified. Review of the MIP images confirms the above findings. IMPRESSION: 1. No evidence of acute pulmonary emboli. 2. Trace right pleural effusion. 3. Marked chronic bronchiectasis and  volume loss in the right middle lobe with milder bronchiectasis, bronchial wall thickening, and nodular densities throughout the right upper and right lower lobe suggestive of chronic endobronchial/atypical mycobacterial infection. Electronically Signed   By: Logan Bores M.D.   On: 04/17/2015 19:31    EKG: Independently reviewed.  Vent. rate 114 BPM PR interval 113 ms QRS duration 86 ms QT/QTc 334/460 ms P-R-T axes 74 68 263 Sinus tachycardia Abnormal T, consider ischemia, diffuse leads Changed from previous EKG  Assessment/Plan Principal Problem:   Bronchiectasis with acute exacerbation (HCC)   MAI (mycobacterium avium-intracellulare) (HCC) Admit to telemetry/inpatient. Continue supplemental oxygen. Continue bronchodilators. Continue IV glucocorticoids. Start Rocephin and Zithromax. Check a sputum Gram stain, culture and sensitivity. Follow influenza A by PCR swab results. Check Legionella and strep pneumoniae urinary antigens.  Active Problems:    Essential hypertension Stable. Continue current antihypertensive therapy. Monitor blood pressure.    CAD (coronary artery disease) s/p CABG No chest pain, but significant EKG changes. Continue cardiac monitoring. Trend troponin levels. Check echocardiogram in a.m.    Hypothyroidism Continue levothyroxine. Monitor TSH periodically.    GERD (gastroesophageal reflux disease) Continue PPI.    Hyperglycemia Follow-up glucose level since the patient received Solu-Medrol.    Influenza-like illness Awaiting Influenza A by PCR swab  results.   Code Status: Full code. DVT Prophylaxis: Lovenox SQ. Family  Communication:  Disposition Plan: Admit for IV antibiotic therapy.  Time spent: Over 70 minutes were spent in the process of this admission.  Reubin Milan, M.D. [facility] Pager [phone].

## 2015-04-17 NOTE — Assessment & Plan Note (Signed)
S: likely over controlled On metformin 500mg  daily. Shakiness for about last month tends to occur before meals. Symptoms get better with eating. Eliminated all additional sugars- cakes, sugars, ice cream and eating low carb- has lost 16 lbs. Admits appetite lower due to metformin so thankful for its aid Lab Results  Component Value Date   HGBA1C 6.3 01/29/2015   HGBA1C 6.0 07/31/2014   HGBA1C 5.9* 05/28/2013   A/P: suspect hypoglycemia as cause. Stop metformin- encouraged regular meals and snacking if symptomatic again. Suspect drastic improvement due to 16 lbs weight loss and avoidance of sugars and carbs

## 2015-04-17 NOTE — ED Notes (Signed)
Bed: WA17 Expected date:  Expected time:  Means of arrival:  Comments: Triage 2  

## 2015-04-17 NOTE — ED Notes (Signed)
EKG done after PT had treatment

## 2015-04-17 NOTE — Patient Instructions (Signed)
I am worried about pneumonia given the crackles I hear on your lung exam. Treat with amoxicillin as well as azithromycin. If you are not improving by Monday or are worsening- see me or Dr. Melvyn Novas or seek care after hours  Stop metformin- I think it is causing low blood sugars. Schedule fasting lab visit 3 months from today so we can check your a1c. I suspect it will be better with weight loss. I do not want you losing anymore weight at this point

## 2015-04-17 NOTE — Assessment & Plan Note (Signed)
S:2-3 days body aches, congestion, some cough. Fatigue. Mild shortness of breath. Compliant with her symbicort. Having to use albuterol some more. Usually uses azithromycin in similar situations A/P: This is typical for patient when she needs azithromycin. I am also concerned with her crackles in RLL and RUL for potential CAP so will treat with amoxicillin as well. Return precautions discussed for after hours care, Dr. Melvyn Novas, and myself.

## 2015-04-18 ENCOUNTER — Inpatient Hospital Stay (HOSPITAL_BASED_OUTPATIENT_CLINIC_OR_DEPARTMENT_OTHER): Payer: Medicare Other

## 2015-04-18 DIAGNOSIS — A310 Pulmonary mycobacterial infection: Secondary | ICD-10-CM

## 2015-04-18 DIAGNOSIS — K219 Gastro-esophageal reflux disease without esophagitis: Secondary | ICD-10-CM | POA: Diagnosis not present

## 2015-04-18 DIAGNOSIS — I2510 Atherosclerotic heart disease of native coronary artery without angina pectoris: Secondary | ICD-10-CM | POA: Diagnosis not present

## 2015-04-18 DIAGNOSIS — E039 Hypothyroidism, unspecified: Secondary | ICD-10-CM | POA: Diagnosis not present

## 2015-04-18 DIAGNOSIS — I959 Hypotension, unspecified: Secondary | ICD-10-CM | POA: Diagnosis not present

## 2015-04-18 DIAGNOSIS — J101 Influenza due to other identified influenza virus with other respiratory manifestations: Secondary | ICD-10-CM

## 2015-04-18 DIAGNOSIS — I10 Essential (primary) hypertension: Secondary | ICD-10-CM | POA: Diagnosis not present

## 2015-04-18 DIAGNOSIS — J479 Bronchiectasis, uncomplicated: Secondary | ICD-10-CM | POA: Diagnosis not present

## 2015-04-18 DIAGNOSIS — E440 Moderate protein-calorie malnutrition: Secondary | ICD-10-CM | POA: Diagnosis not present

## 2015-04-18 DIAGNOSIS — J111 Influenza due to unidentified influenza virus with other respiratory manifestations: Secondary | ICD-10-CM | POA: Diagnosis not present

## 2015-04-18 LAB — COMPREHENSIVE METABOLIC PANEL
ALT: 27 U/L (ref 14–54)
ANION GAP: 7 (ref 5–15)
AST: 47 U/L — ABNORMAL HIGH (ref 15–41)
Albumin: 3.3 g/dL — ABNORMAL LOW (ref 3.5–5.0)
Alkaline Phosphatase: 52 U/L (ref 38–126)
BILIRUBIN TOTAL: 0.3 mg/dL (ref 0.3–1.2)
BUN: 17 mg/dL (ref 6–20)
CO2: 22 mmol/L (ref 22–32)
Calcium: 8.4 mg/dL — ABNORMAL LOW (ref 8.9–10.3)
Chloride: 105 mmol/L (ref 101–111)
Creatinine, Ser: 0.97 mg/dL (ref 0.44–1.00)
GFR, EST NON AFRICAN AMERICAN: 57 mL/min — AB (ref >60)
Glucose, Bld: 217 mg/dL — ABNORMAL HIGH (ref 65–99)
POTASSIUM: 4.8 mmol/L (ref 3.5–5.1)
Sodium: 134 mmol/L — ABNORMAL LOW (ref 135–145)
TOTAL PROTEIN: 6.2 g/dL — AB (ref 6.5–8.1)

## 2015-04-18 LAB — INFLUENZA PANEL BY PCR (TYPE A & B)
H1N1FLUPCR: NOT DETECTED
INFLAPCR: POSITIVE — AB
INFLBPCR: NEGATIVE

## 2015-04-18 LAB — CBC WITH DIFFERENTIAL/PLATELET
Basophils Absolute: 0 10*3/uL (ref 0.0–0.1)
Basophils Relative: 0 %
EOS PCT: 0 %
Eosinophils Absolute: 0 10*3/uL (ref 0.0–0.7)
HEMATOCRIT: 36.6 % (ref 36.0–46.0)
Hemoglobin: 12.5 g/dL (ref 12.0–15.0)
LYMPHS PCT: 5 %
Lymphs Abs: 0.3 10*3/uL — ABNORMAL LOW (ref 0.7–4.0)
MCH: 31.3 pg (ref 26.0–34.0)
MCHC: 34.2 g/dL (ref 30.0–36.0)
MCV: 91.7 fL (ref 78.0–100.0)
MONO ABS: 0.1 10*3/uL (ref 0.1–1.0)
MONOS PCT: 2 %
NEUTROS ABS: 5.3 10*3/uL (ref 1.7–7.7)
Neutrophils Relative %: 93 %
PLATELETS: 179 10*3/uL (ref 150–400)
RBC: 3.99 MIL/uL (ref 3.87–5.11)
RDW: 12.4 % (ref 11.5–15.5)
WBC: 5.7 10*3/uL (ref 4.0–10.5)

## 2015-04-18 LAB — STREP PNEUMONIAE URINARY ANTIGEN: Strep Pneumo Urinary Antigen: NEGATIVE

## 2015-04-18 LAB — ECHOCARDIOGRAM COMPLETE
HEIGHTINCHES: 61 in
Weight: 2067.03 oz

## 2015-04-18 LAB — TROPONIN I
TROPONIN I: 0.04 ng/mL — AB (ref <0.031)
TROPONIN I: 0.03 ng/mL (ref <0.031)

## 2015-04-18 MED ORDER — DEXTROMETHORPHAN POLISTIREX ER 30 MG/5ML PO SUER
15.0000 mg | Freq: Two times a day (BID) | ORAL | Status: DC
  Administered 2015-04-18 – 2015-04-19 (×2): 15 mg via ORAL
  Filled 2015-04-18 (×4): qty 5

## 2015-04-18 MED ORDER — POTASSIUM PHOSPHATE MONOBASIC 500 MG PO TABS
500.0000 mg | ORAL_TABLET | Freq: Two times a day (BID) | ORAL | Status: AC
  Administered 2015-04-18 (×2): 500 mg via ORAL
  Filled 2015-04-18 (×2): qty 1

## 2015-04-18 MED ORDER — AMITRIPTYLINE HCL 25 MG PO TABS
50.0000 mg | ORAL_TABLET | Freq: Every day | ORAL | Status: DC
  Administered 2015-04-18: 50 mg via ORAL
  Filled 2015-04-18: qty 2

## 2015-04-18 MED ORDER — ASPIRIN EC 81 MG PO TBEC
81.0000 mg | DELAYED_RELEASE_TABLET | Freq: Every day | ORAL | Status: DC
  Administered 2015-04-18 – 2015-04-19 (×2): 81 mg via ORAL
  Filled 2015-04-18 (×2): qty 1

## 2015-04-18 MED ORDER — LEVOTHYROXINE SODIUM 50 MCG PO TABS
75.0000 ug | ORAL_TABLET | Freq: Every day | ORAL | Status: DC
  Administered 2015-04-18 – 2015-04-19 (×2): 75 ug via ORAL
  Filled 2015-04-18 (×2): qty 1

## 2015-04-18 MED ORDER — POTASSIUM CHLORIDE 10 MEQ/100ML IV SOLN
10.0000 meq | INTRAVENOUS | Status: AC
  Administered 2015-04-18: 10 meq via INTRAVENOUS
  Filled 2015-04-18: qty 100

## 2015-04-18 MED ORDER — OSELTAMIVIR PHOSPHATE 30 MG PO CAPS
30.0000 mg | ORAL_CAPSULE | Freq: Two times a day (BID) | ORAL | Status: DC
  Administered 2015-04-18 – 2015-04-19 (×2): 30 mg via ORAL
  Filled 2015-04-18 (×3): qty 1

## 2015-04-18 MED ORDER — PHENOL 1.4 % MT LIQD
1.0000 | OROMUCOSAL | Status: DC | PRN
  Filled 2015-04-18: qty 177

## 2015-04-18 MED ORDER — ATORVASTATIN CALCIUM 40 MG PO TABS
80.0000 mg | ORAL_TABLET | Freq: Every day | ORAL | Status: DC
  Administered 2015-04-18 – 2015-04-19 (×2): 80 mg via ORAL
  Filled 2015-04-18 (×2): qty 2

## 2015-04-18 MED ORDER — MENTHOL 3 MG MT LOZG
1.0000 | LOZENGE | OROMUCOSAL | Status: DC | PRN
  Filled 2015-04-18: qty 9

## 2015-04-18 MED ORDER — GUAIFENESIN ER 600 MG PO TB12: 600.0000 mg | ORAL_TABLET | Freq: Two times a day (BID) | ORAL | Status: DC

## 2015-04-18 MED ORDER — OSELTAMIVIR PHOSPHATE 75 MG PO CAPS
75.0000 mg | ORAL_CAPSULE | Freq: Two times a day (BID) | ORAL | Status: DC
  Administered 2015-04-18: 75 mg via ORAL
  Filled 2015-04-18 (×2): qty 1

## 2015-04-18 MED ORDER — METHYLPREDNISOLONE SODIUM SUCC 40 MG IJ SOLR
40.0000 mg | Freq: Two times a day (BID) | INTRAMUSCULAR | Status: DC
Start: 2015-04-18 — End: 2015-04-18
  Administered 2015-04-18: 40 mg via INTRAVENOUS
  Filled 2015-04-18: qty 1

## 2015-04-18 MED ORDER — BENZONATATE 100 MG PO CAPS
200.0000 mg | ORAL_CAPSULE | Freq: Three times a day (TID) | ORAL | Status: DC
  Administered 2015-04-18 – 2015-04-19 (×4): 200 mg via ORAL
  Filled 2015-04-18 (×4): qty 2

## 2015-04-18 MED ORDER — FAMOTIDINE 20 MG PO TABS
20.0000 mg | ORAL_TABLET | Freq: Two times a day (BID) | ORAL | Status: DC
  Administered 2015-04-18 – 2015-04-19 (×3): 20 mg via ORAL
  Filled 2015-04-18 (×3): qty 1

## 2015-04-18 NOTE — Progress Notes (Signed)
Echocardiogram 2D Echocardiogram has been performed.  Belinda Day 04/18/2015, 1:43 PM

## 2015-04-18 NOTE — Progress Notes (Signed)
Initial Nutrition Assessment  DOCUMENTATION CODES:   Non-severe (moderate) malnutrition in context of acute illness/injury  INTERVENTION:  - Supplement with Ensure Enlive BID. Each supplement provides 350 kcals and 20 grams of protein. - RD will continue to monitor nutritional status and needs.    NUTRITION DIAGNOSIS:   Inadequate oral intake related to nausea, vomiting, poor appetite as evidenced by meal completion < 25%.  GOAL:   Patient will meet greater than or equal to 90% of their needs  MONITOR:   PO intake, Supplement acceptance, Labs, Weight trends, I & O's, Skin  REASON FOR ASSESSMENT:   Malnutrition Screening Tool    ASSESSMENT:   70 y.o. female with a past medical history of bronchiectasis, MAI (mycobacterium avium-intracellulare) ([place]), CAD, hyperlipidemia, hypertension, GERD, hypothyroidism who is coming to the emergency department due to worsening of shortness of breath.  Pt's BMI is 24.42 kg/m^2 which is categorized as a healthy weight. Pt states that she lost 16 lbs after doctor prescribed Metformin. Pt reports she was taken off Metformin 2 days ago.  Pt reports flu like symptoms and a poor appetite PTA. Pt states she has vomited once after taking medicine and is now receiving meds via IV. Per chart review, pt ate 20% of breakfast. Pt stated breakfast of egg white omelet. Pt reports not eating d/t poor appetite. Pt received Ensure Enlive and reports 100% completion. Pt will continue to receive Ensure to supplement diet.   Pt reports eating 3 meals and 2 snacks each day PTA. Breakfast is coffee and a protein shake, snack is cheese and crackers, lunch is a [place] sandwich, snack is a protein shake, and dinner is Lean Cuisine and fruit.   NFPE: Mild muscle depletion (lower extremities), mild fat depletion (upper extremities), and no edema. Pt has experienced an 8% weight loss in the past 3 months. This is significant for time frame.   Labs Reviewed; Ca 84. Mg/dL,  CBGs 143 mg/dL.  Medications reviewed; K-Phos tablet 500 mg TID.  Diet Order:  Diet Heart Room service appropriate?: Yes; Fluid consistency:: Thin  Skin:  Reviewed, no issues  Last BM:  3/20  Height:   Ht Readings from Last 1 Encounters:  04/17/15 5\' 1"  (1.549 m)    Weight:   Wt Readings from Last 1 Encounters:  04/17/15 129 lb 3 oz (58.6 kg)    Ideal Body Weight:  105 kg  BMI:  Body mass index is 24.42 kg/(m^2).  Estimated Nutritional Needs:   Kcal:  1450-1650 kcals (25-28 kcals/kg)  Protein:  70-80 g (1.2-1.3 g/kcal or ~20% kcals)  Fluid:  1.5-16. L  EDUCATION NEEDS:   No education needs identified at this time  Geoffery Lyons, [place] [facility] Pager [phone]

## 2015-04-18 NOTE — Progress Notes (Signed)
[facility] Progress Note   Belinda Day  [id number]  DOB: 08/10/1945  DOA: 04/17/2015 PCP: Garret Reddish, MD  Brief narrative: Belinda Day is a 70 y.o. female with a past medical history of bronchiectasis, MAI (mycobacterium avium-intracellulare) ([place]), CAD, hyperlipidemia, hypertension, GERD, hypothyroidism presents to the ER for Fever body aches and increased congestion- normally has yellow sputum at baseline- follow with pulmonary- will see Dr Melvyn Novas in April  Subjective: Body aches and fever are gone. Mildly nauseated. Still with cough and dyspnea.   Assessment/Plan: Principal Problem:   Influenza A - cont Tamiflu which was started on admission - stop antibiotics - Delsym, Mucinex, Tessalon- Tussionex causes nausea  Active Problems:   MAI (mycobacterium avium-intracellulare)  - chronic bronchiectasis - no flare - due to see Dr Melvyn Novas next month - no changes in outpt treatment plan for now    Essential hypertension - bradycardic and hypotensive as this time - hold Bystolic and Diovan    CAD (coronary artery disease) s/p CABG - cont ASA    Hypothyroidism - cont synthroid  GERD - cont BID pepcid which she takes at home  Antibiotics: Anti-infectives    Start     Dose/Rate Route Frequency Ordered Stop   04/18/15 2200  oseltamivir (TAMIFLU) capsule 30 mg     30 mg Oral 2 times daily 04/18/15 1321 04/23/15 0959   04/18/15 0515  oseltamivir (TAMIFLU) capsule 75 mg  Status:  Discontinued     75 mg Oral 2 times daily 04/18/15 0505 04/18/15 1321   04/17/15 2300  azithromycin (ZITHROMAX) 500 mg in dextrose 5 % 250 mL IVPB  Status:  Discontinued     500 mg 250 mL/hr over 60 Minutes Intravenous Every 24 hours 04/17/15 2113 04/18/15 0737   04/17/15 2200  cefTRIAXone (ROCEPHIN) 1 g in dextrose 5 % 50 mL IVPB  Status:  Discontinued     1 g 100 mL/hr over 30 Minutes Intravenous Every 24 hours 04/17/15 2113 04/18/15 0737     Code Status:     Code Status  Orders        Start     Ordered   04/17/15 2114  Full code   Continuous     04/17/15 2113    Code Status History    Date Active Date Inactive Code Status Order ID Comments User Context   12/21/2012  7:56 PM 12/29/2012  9:20 PM Full Code [id number]  Madilyn Hook, DO Inpatient    Advance Directive Documentation        Most Recent Value   Type of Advance Directive  Healthcare Power of Attorney, Living will   Pre-existing out of facility DNR order (yellow form or pink MOST form)     "MOST" Form in Place?       Family Communication:   Disposition Plan: home tomorrow DVT prophylaxis: Lovenox Consultants:  Procedures:     Objective: Filed Weights   04/17/15 2110  Weight: 58.6 kg (129 lb 3 oz)    Intake/Output Summary (Last 24 hours) at 04/18/15 1335 Last data filed at 04/18/15 0700  Gross per 24 hour  Intake 1262.5 ml  Output      1 ml  Net 1261.5 ml     Vitals Filed Vitals:   04/17/15 2030 04/17/15 2110 04/17/15 2213 04/18/15 0500  BP: 100/51 111/45  110/55  Pulse: 99 83  57  Temp:  100 F (37.8 C)  98.1 F (36.7 C)  TempSrc:  Oral  Oral  Resp:  16  17  Height:  5\' 1"  (1.549 m)    Weight:  58.6 kg (129 lb 3 oz)    SpO2: 99% 98% 96% 92%    Exam:  General:  Pt is alert, not in acute distress  HEENT: No icterus, No thrush, oral mucosa moist  Cardiovascular: regular rate and rhythm, S1/S2 No murmur  Respiratory: clear to auscultation bilaterally - severe cough   Abdomen: Soft, +Bowel sounds, non tender, non distended, no guarding  MSK: No cyanosis or clubbing- no pedal edema   Data Reviewed: Basic Metabolic Panel:  Recent Labs Lab 04/17/15 1810 04/17/15 1828 04/18/15 0630  NA 136 135 134*  K 3.4* 3.3* 4.8  CL 102 99* 105  CO2 22  --  22  GLUCOSE 139* 136* 217*  BUN 16 15 17   CREATININE 0.90 0.80 0.97  CALCIUM 8.7*  --  8.4*  MG 2.2  --   --   PHOS 2.3*  --   --    Liver Function Tests:  Recent Labs Lab 04/18/15 0630  AST 47*  ALT 27   ALKPHOS 52  BILITOT 0.3  PROT 6.2*  ALBUMIN 3.3*   No results for input(s): LIPASE, AMYLASE in the last 168 hours. No results for input(s): AMMONIA in the last 168 hours. CBC:  Recent Labs Lab 04/17/15 1810 04/17/15 1828 04/18/15 0630  WBC 5.6  --  5.7  NEUTROABS 4.4  --  5.3  HGB 12.3 13.6 12.5  HCT 37.4 40.0 36.6  MCV 94.4  --  91.7  PLT 193  --  179   Cardiac Enzymes:  Recent Labs Lab 04/18/15 0230 04/18/15 0630  TROPONINI 0.04* 0.03   BNP (last 3 results)  Recent Labs  04/17/15 1810  BNP 226.9*    ProBNP (last 3 results) No results for input(s): PROBNP in the last 8760 hours.  CBG:  Recent Labs Lab 04/17/15 1930  GLUCAP 143*    No results found for this or any previous visit (from the past 240 hour(s)).   Studies: Dg Chest 2 View  04/17/2015  CLINICAL DATA:  Productive cough, shortness of breath for 3 days EXAM: CHEST  2 VIEW COMPARISON:  09/03/2014 FINDINGS: Cardiomediastinal silhouette is stable. Mild hyperinflation again noted. Stable streaky scarring in right perihilar region and lingula. No definite superimposed infiltrate or pulmonary edema. Status post CABG. Osteopenia and mild degenerative changes thoracic spine. IMPRESSION: No active disease. Mild hyperinflation again noted. Stable streaky scarring right perihilar and lingula. No definite superimposed infiltrate. Electronically Signed   By: Lahoma Crocker M.D.   On: 04/17/2015 17:03   Ct Angio Chest Pe W/cm &/or Wo Cm  04/17/2015  CLINICAL DATA:  Shortness of breath. Cold and congestion for 3 days. EXAM: CT ANGIOGRAPHY CHEST WITH CONTRAST TECHNIQUE: Multidetector CT imaging of the chest was performed using the standard protocol during bolus administration of intravenous contrast. Multiplanar CT image reconstructions and MIPs were obtained to evaluate the vascular anatomy. CONTRAST:  135mL OMNIPAQUE IOHEXOL 350 MG/ML SOLN COMPARISON:  Chest radiographs 04/17/2015 and CT 08/03/2007 FINDINGS: There is no  evidence of pulmonary arterial emboli. The right middle lobe pulmonary artery is not well seen and may be small due to chronic parenchymal middle lobe lung disease. Sequelae of prior CABG are identified. Heart is within normal limits for size. Aortic and coronary artery calcification is noted. A right paratracheal node is minimally larger than on the prior CT, measuring 10 mm in short axis (previously 9 mm). No enlarged hilar  lymph nodes are identified. There is a trace right pleural effusion. Bronchial wall thickening and bronchiectasis are again seen involving the right greater than left lungs. The cavitary right upper lobe nodule on the prior CT has decreased in size, now measuring 1.7 x 1.5 cm and likely reflecting scarring associated with bronchiectasis. Extensive bronchiectasis and volume loss throughout the right middle lobe are similar to the prior CT. There is a 1.3 cm nodular opacity in the right lower lobe at the site of the prior 3.8 cm cavitary nodule, likely reflecting residual scarring or focal atelectasis. Right lower lobe bronchiectasis is similar to the prior CT. Tiny nodular densities throughout the right lower and right upper lobes are again seen, some of which are tree-in-bud in configuration. Mild atelectasis is present in the basilar right lower lobe, and there is also mild chronic lingular volume loss. Minimal basilar left lower lobe atelectasis is noted. Scattered calcifications are again seen throughout both lungs. No acute abnormality is identified in the visualized upper portion of the abdomen. No acute osseous abnormality is identified. Review of the MIP images confirms the above findings. IMPRESSION: 1. No evidence of acute pulmonary emboli. 2. Trace right pleural effusion. 3. Marked chronic bronchiectasis and volume loss in the right middle lobe with milder bronchiectasis, bronchial wall thickening, and nodular densities throughout the right upper and right lower lobe suggestive of  chronic endobronchial/atypical mycobacterial infection. Electronically Signed   By: Logan Bores M.D.   On: 04/17/2015 19:31    Scheduled Meds:  Scheduled Meds: . amitriptyline  50 mg Oral QHS  . aspirin EC  81 mg Oral Daily  . atorvastatin  80 mg Oral Daily  . benzonatate  200 mg Oral TID  . dextromethorphan  15 mg Oral BID  . enoxaparin (LOVENOX) injection  40 mg Subcutaneous Q24H  . famotidine  20 mg Oral BID  . feeding supplement (ENSURE ENLIVE)  237 mL Oral BID BM  . guaiFENesin  600 mg Oral BID  . ipratropium-albuterol  3 mL Nebulization TID  . levothyroxine  75 mcg Oral QAC breakfast  . oseltamivir  30 mg Oral BID  . potassium phosphate (monobasic)  500 mg Oral BID WC  . sodium chloride flush  3 mL Intravenous Q12H   Continuous Infusions:   Time spent on care of this patient: 35 min   [place], MD 04/18/2015, 1:35 PM  LOS: 1 day   [facility] Office  [phone] Pager - Text Page per [email or web address] If 7PM-7AM, please contact night-coverage [email or web address]

## 2015-04-19 DIAGNOSIS — J101 Influenza due to other identified influenza virus with other respiratory manifestations: Secondary | ICD-10-CM | POA: Diagnosis not present

## 2015-04-19 DIAGNOSIS — E440 Moderate protein-calorie malnutrition: Secondary | ICD-10-CM

## 2015-04-19 DIAGNOSIS — I10 Essential (primary) hypertension: Secondary | ICD-10-CM | POA: Diagnosis not present

## 2015-04-19 DIAGNOSIS — A310 Pulmonary mycobacterial infection: Secondary | ICD-10-CM | POA: Diagnosis not present

## 2015-04-19 MED ORDER — BENZONATATE 100 MG PO CAPS: 100.0000 mg | ORAL_CAPSULE | Freq: Three times a day (TID) | ORAL | Status: DC | PRN

## 2015-04-19 MED ORDER — OSELTAMIVIR PHOSPHATE 30 MG PO CAPS: 30.0000 mg | ORAL_CAPSULE | Freq: Two times a day (BID) | ORAL | Status: DC

## 2015-04-19 MED ORDER — DEXTROMETHORPHAN POLISTIREX ER 30 MG/5ML PO SUER: 15.0000 mg | Freq: Two times a day (BID) | ORAL | Status: DC

## 2015-04-19 NOTE — Discharge Summary (Addendum)
Physician Discharge Summary  Belinda Day [id number] DOB: 06/20/1945 DOA: 04/17/2015  PCP: Garret Reddish, MD  Admit date: 04/17/2015 Discharge date: 04/19/2015  Time spent: 50 minutes  Recommendations for Outpatient Follow-up:  1. Hypotensive- hold Bystolic and Diovan  2. Hypocalcemia- cont Vit D - f/u with PCP  Discharge Condition: stable    Discharge Diagnoses:  Principal Problem:   Influenza A Active Problems:   MAI (mycobacterium avium-intracellulare) ([place])   Essential hypertension   CAD (coronary artery disease) s/p CABG 15 yrs ago   Hypothyroidism   GERD (gastroesophageal reflux disease)   Bronchiectasis chronic Hypocalcemia      History of present illness:  Belinda Day is a 70 y.o. female with a past medical history of bronchiectasis, MAI (mycobacterium avium-intracellulare) ([place]), CAD, hyperlipidemia, hypertension, GERD, hypothyroidism who is coming to the emergency department due to worsening of shortness of breath.  Per patient, she has been having URI symptoms for the past 3 days. She saw her pulmonologist today who prescribed her a Z-Pak, and inhaler and amoxicillin. However when she got home she had increased dyspnea and decided to come home after she spoke to one of her neighbors who was concerned about her symptoms.   When seen in the emergency department, the patient was in no acute distress. She states that she feels a lot better after receiving treatment.Workup in the emergency department shows severe bronchiectasis on CT scan of the chest, mild hypokalemia and an EKG with diffuse T-wave inversions. However, the patient's denies any chest pain, palpitations, dizziness, diaphoresis, PND, orthopnea or pitting edema of the lower extremities.   Assessment/Plan: Principal Problem:  Influenza A - cont Tamiflu which was started on admission - stopped antibiotics- has chronically yellow sputum - Delsym, Mucinex, Tessalon- no further fevers, body aches  resolved, has cough with congestion  Active Problems:  MAI (mycobacterium avium-intracellulare)  - chronic bronchiectasis - no flare - due to see Dr Melvyn Novas next month - no changes in outpt treatment plan for now   Essential hypertension - bradycardic and hypotensive as this time - hold Bystolic and Diovan   Abnormal EKG-  - diffuse T wave inversions on admission- no chest pain- troponin noted below- repeat EKG the next morning revealed inversions had resolved   - ECHO ordered by admitting doctor looks normal- see below     Ref. Range 04/17/2015 18:27  Troponin i, poc Latest Ref Range: 0.00-0.08 ng/mL 0.01      Ref. Range 04/18/2015 02:30 04/18/2015 06:30  Troponin I Latest Ref Range: <0.031 ng/mL 0.04 (H) 0.03      CAD (coronary artery disease) s/p CABG - cont ASA   Hypothyroidism - cont synthroid  GERD - cont BID pepcid which she takes at home  Mild hypocalcemia - calcium levels mildly low - states PCP is aware of this   Procedures: 2 D ECHO Left ventricle: The cavity size was normal. Wall thickness was  normal. Systolic function was normal. The estimated ejection  fraction was in the range of 60% to 65%. Wall motion was normal;  there were no regional wall motion abnormalities. Left  ventricular diastolic function parameters were normal. - Aortic valve: There was trivial regurgitation. - Pulmonary arteries: Systolic pressure was mildly increased. PA  peak pressure: 31 mm Hg (S).  Consultations:  none  Discharge Exam: Filed Weights   04/17/15 2110  Weight: 58.6 kg (129 lb 3 oz)   Filed Vitals:   04/18/15 2144 04/19/15 0555  BP: 115/53 103/56  Pulse:  66 91  Temp: 99.1 F (37.3 C) 98.1 F (36.7 C)  Resp: 16 17    General: AAO x 3, no distress Cardiovascular: RRR, no murmurs  Respiratory: mild rhonchi, no wheezing GI: soft, non-tender, non-distended, bowel sound positive  Discharge Instructions You were cared for by a hospitalist during  your hospital stay. If you have any questions about your discharge medications or the care you received while you were in the hospital after you are discharged, you can call the unit and asked to speak with the hospitalist on call if the hospitalist that took care of you is not available. Once you are discharged, your primary care physician will handle any further medical issues. Please note that NO REFILLS for any discharge medications will be authorized once you are discharged, as it is imperative that you return to your primary care physician (or establish a relationship with a primary care physician if you do not have one) for your aftercare needs so that they can reassess your need for medications and monitor your lab values.      Discharge Instructions    Diet - low sodium heart healthy    Complete by:  As directed      Increase activity slowly    Complete by:  As directed             Medication List    STOP taking these medications        amoxicillin 875 MG tablet  Commonly known as:  AMOXIL     azithromycin 250 MG tablet  Commonly known as:  ZITHROMAX     nebivolol 5 MG tablet  Commonly known as:  BYSTOLIC     valsartan 0000000 MG tablet  Commonly known as:  DIOVAN      TAKE these medications        acetaminophen 650 MG CR tablet  Commonly known as:  TYLENOL  every 8 (eight) hours as needed for pain.     albuterol 108 (90 Base) MCG/ACT inhaler  Commonly known as:  PROAIR HFA  Inhale 2 puffs into the lungs every 6 (six) hours as needed for wheezing or shortness of breath.     ALPRAZolam 0.25 MG tablet  Commonly known as:  XANAX  take 1 tablet by mouth at bedtime if needed for sleep     amitriptyline 50 MG tablet  Commonly known as:  ELAVIL  Take 1 tablet (50 mg total) by mouth at bedtime.     aspirin 81 MG tablet  Take 81 mg by mouth daily.     atorvastatin 80 MG tablet  Commonly known as:  LIPITOR  Take 1 tablet (80 mg total) by mouth daily.     benzonatate  100 MG capsule  Commonly known as:  TESSALON  Take 1 capsule (100 mg total) by mouth 3 (three) times daily as needed for cough.     budesonide-formoterol 160-4.5 MCG/ACT inhaler  Commonly known as:  SYMBICORT  Inhale 2 puffs into the lungs 2 (two) times daily.     dextromethorphan 30 MG/5ML liquid  Commonly known as:  DELSYM  Take 2.5 mLs (15 mg total) by mouth 2 (two) times daily.     FLUTTER Devi  Use as directed     guaiFENesin 600 MG 12 hr tablet  Commonly known as:  MUCINEX  Take 600 mg by mouth 2 (two) times daily.     meclizine 25 MG tablet  Commonly known as:  ANTIVERT  Take 1 tablet (  25 mg total) by mouth 3 (three) times daily as needed for dizziness or nausea.     oseltamivir 30 MG capsule  Commonly known as:  TAMIFLU  Take 1 capsule (30 mg total) by mouth 2 (two) times daily.     SYNTHROID 75 MCG tablet  Generic drug:  levothyroxine  One tab by mouth once daily 6 days a week. 1.5 tab on 7th day       Allergies  Allergen Reactions  . Bactrim [Sulfamethoxazole-Trimethoprim] Hives, Itching and Other (See Comments)    Bruised like areas on body  . Ciprofloxacin Other (See Comments)    Body aches  . Codeine Nausea Only    REACTION: nausea  . Erythromycin Nausea Only    REACTION: nausea  . Levofloxacin Other (See Comments)    REACTION: aches   Follow-up Information    Follow up with Garret Reddish, MD In 1 week.   Specialty:  Family Medicine   Why:  take this d/c paperwork with you   Contact information:   [address] [place] [id number] [phone]        The results of significant diagnostics from this hospitalization (including imaging, microbiology, ancillary and laboratory) are listed below for reference.    Significant Diagnostic Studies: Dg Chest 2 View  04/17/2015  CLINICAL DATA:  Productive cough, shortness of breath for 3 days EXAM: CHEST  2 VIEW COMPARISON:  09/03/2014 FINDINGS: Cardiomediastinal silhouette is stable. Mild  hyperinflation again noted. Stable streaky scarring in right perihilar region and lingula. No definite superimposed infiltrate or pulmonary edema. Status post CABG. Osteopenia and mild degenerative changes thoracic spine. IMPRESSION: No active disease. Mild hyperinflation again noted. Stable streaky scarring right perihilar and lingula. No definite superimposed infiltrate. Electronically Signed   By: Lahoma Crocker M.D.   On: 04/17/2015 17:03   Ct Angio Chest Pe W/cm &/or Wo Cm  04/17/2015  CLINICAL DATA:  Shortness of breath. Cold and congestion for 3 days. EXAM: CT ANGIOGRAPHY CHEST WITH CONTRAST TECHNIQUE: Multidetector CT imaging of the chest was performed using the standard protocol during bolus administration of intravenous contrast. Multiplanar CT image reconstructions and MIPs were obtained to evaluate the vascular anatomy. CONTRAST:  140mL OMNIPAQUE IOHEXOL 350 MG/ML SOLN COMPARISON:  Chest radiographs 04/17/2015 and CT 08/03/2007 FINDINGS: There is no evidence of pulmonary arterial emboli. The right middle lobe pulmonary artery is not well seen and may be small due to chronic parenchymal middle lobe lung disease. Sequelae of prior CABG are identified. Heart is within normal limits for size. Aortic and coronary artery calcification is noted. A right paratracheal node is minimally larger than on the prior CT, measuring 10 mm in short axis (previously 9 mm). No enlarged hilar lymph nodes are identified. There is a trace right pleural effusion. Bronchial wall thickening and bronchiectasis are again seen involving the right greater than left lungs. The cavitary right upper lobe nodule on the prior CT has decreased in size, now measuring 1.7 x 1.5 cm and likely reflecting scarring associated with bronchiectasis. Extensive bronchiectasis and volume loss throughout the right middle lobe are similar to the prior CT. There is a 1.3 cm nodular opacity in the right lower lobe at the site of the prior 3.8 cm cavitary  nodule, likely reflecting residual scarring or focal atelectasis. Right lower lobe bronchiectasis is similar to the prior CT. Tiny nodular densities throughout the right lower and right upper lobes are again seen, some of which are tree-in-bud in configuration. Mild atelectasis  is present in the basilar right lower lobe, and there is also mild chronic lingular volume loss. Minimal basilar left lower lobe atelectasis is noted. Scattered calcifications are again seen throughout both lungs. No acute abnormality is identified in the visualized upper portion of the abdomen. No acute osseous abnormality is identified. Review of the MIP images confirms the above findings. IMPRESSION: 1. No evidence of acute pulmonary emboli. 2. Trace right pleural effusion. 3. Marked chronic bronchiectasis and volume loss in the right middle lobe with milder bronchiectasis, bronchial wall thickening, and nodular densities throughout the right upper and right lower lobe suggestive of chronic endobronchial/atypical mycobacterial infection. Electronically Signed   By: Logan Bores M.D.   On: 04/17/2015 19:31    Microbiology: Recent Results (from the past 240 hour(s))  Culture, blood (routine x 2) Call MD if unable to obtain prior to antibiotics being given     Status: None (Preliminary result)   Collection Time: 04/17/15  9:57 PM  Result Value Ref Range Status   Specimen Description BLOOD LEFT ANTECUBITAL  Final   Special Requests   Final    BOTTLES DRAWN AEROBIC AND ANAEROBIC 5CC EA IMMUNOCOMPROMISED   Culture   Final    NO GROWTH 1 DAY Performed at [facility]    Report Status PENDING  Incomplete  Culture, blood (routine x 2) Call MD if unable to obtain prior to antibiotics being given     Status: None (Preliminary result)   Collection Time: 04/17/15  9:58 PM  Result Value Ref Range Status   Specimen Description BLOOD R ARM  Final   Special Requests   Final    BOTTLES DRAWN AEROBIC AND ANAEROBIC 10CC EA  IMMUNOCOMPROMISED   Culture   Final    NO GROWTH 1 DAY Performed at [facility]    Report Status PENDING  Incomplete     Labs: Basic Metabolic Panel:  Recent Labs Lab 04/17/15 1810 04/17/15 1828 04/18/15 0630  NA 136 135 134*  K 3.4* 3.3* 4.8  CL 102 99* 105  CO2 22  --  22  GLUCOSE 139* 136* 217*  BUN 16 15 17   CREATININE 0.90 0.80 0.97  CALCIUM 8.7*  --  8.4*  MG 2.2  --   --   PHOS 2.3*  --   --    Liver Function Tests:  Recent Labs Lab 04/18/15 0630  AST 47*  ALT 27  ALKPHOS 52  BILITOT 0.3  PROT 6.2*  ALBUMIN 3.3*   No results for input(s): LIPASE, AMYLASE in the last 168 hours. No results for input(s): AMMONIA in the last 168 hours. CBC:  Recent Labs Lab 04/17/15 1810 04/17/15 1828 04/18/15 0630  WBC 5.6  --  5.7  NEUTROABS 4.4  --  5.3  HGB 12.3 13.6 12.5  HCT 37.4 40.0 36.6  MCV 94.4  --  91.7  PLT 193  --  179   Cardiac Enzymes:  Recent Labs Lab 04/18/15 0230 04/18/15 0630  TROPONINI 0.04* 0.03   BNP: BNP (last 3 results)  Recent Labs  04/17/15 1810  BNP 226.9*    ProBNP (last 3 results) No results for input(s): PROBNP in the last 8760 hours.  CBG:  Recent Labs Lab 04/17/15 1930  GLUCAP 143*       SignedDebbe Odea, MD [facility] 04/19/2015, 11:43 AM

## 2015-04-21 LAB — LEGIONELLA PNEUMOPHILA SEROGP 1 UR AG: L. PNEUMOPHILA SEROGP 1 UR AG: NEGATIVE

## 2015-04-23 LAB — CULTURE, BLOOD (ROUTINE X 2)
CULTURE: NO GROWTH
Culture: NO GROWTH

## 2015-04-23 NOTE — Progress Notes (Signed)
HPI: FU CAD; history of coronary artery disease status post coronary artery bypass graft in 2002. Nuclear study January 2015 showed an ejection fraction of 89% and normal perfusion. Carotid Dopplers December 2015 showed no significant obstruction. Echocardiogram March 2017 showed normal LV function and trace aortic insufficiency. CTA March 2017 showed no pulmonary embolus. There was bronchiectasis noted. Recently admitted with flu symptoms. Her blood pressure was low and she was bradycardic and her bystolic and Diovan were held. Initial EKG did show diffuse T-wave inversion. Since last seen, Patient has improved from her recent hospitalization. Mild dyspnea on exertion but no orthopnea, PND, pedal edema, chest pain or syncope.  Current Outpatient Prescriptions  Medication Sig Dispense Refill  . acetaminophen (TYLENOL) 650 MG CR tablet every 8 (eight) hours as needed for pain.     Marland Kitchen albuterol (PROAIR HFA) 108 (90 BASE) MCG/ACT inhaler Inhale 2 puffs into the lungs every 6 (six) hours as needed for wheezing or shortness of breath. 1 Inhaler 11  . ALPRAZolam (XANAX) 0.25 MG tablet take 1 tablet by mouth at bedtime if needed for sleep 60 tablet 4  . amitriptyline (ELAVIL) 50 MG tablet Take 1 tablet (50 mg total) by mouth at bedtime. 90 tablet 3  . aspirin 81 MG tablet Take 81 mg by mouth daily.     Marland Kitchen atorvastatin (LIPITOR) 80 MG tablet Take 1 tablet (80 mg total) by mouth daily. 90 tablet 3  . benzonatate (TESSALON) 100 MG capsule Take 1 capsule (100 mg total) by mouth 3 (three) times daily as needed for cough. 60 capsule 0  . budesonide-formoterol (SYMBICORT) 160-4.5 MCG/ACT inhaler Inhale 2 puffs into the lungs 2 (two) times daily. 10.2 g 11  . guaiFENesin (MUCINEX) 600 MG 12 hr tablet Take 600 mg by mouth 2 (two) times daily.    Marland Kitchen Respiratory Therapy Supplies (FLUTTER) DEVI Use as directed 1 each 0  . SYNTHROID 75 MCG tablet One tab by mouth once daily 6 days a week. 1.5 tab on 7th day 95  tablet 3   No current facility-administered medications for this visit.     Past Medical History  Diagnosis Date  . Bronchiectasis     oxygen at night in the past  . MAI (mycobacterium avium-intracellulare) ([place])   . CAD (coronary artery disease)   . Hyperlipidemia   . HTN (hypertension)   . GERD (gastroesophageal reflux disease)   . History of shingles 04/2012  . Hypothyroidism   . Neuritis of upper extremity   . Diverticulitis 2014    Past Surgical History  Procedure Laterality Date  . Triple bypass  2004    x3 CABG    Social History   Social History  . Marital Status: Single    Spouse Name: N/A  . Number of Children: N/A  . Years of Education: N/A   Occupational History  . Not on file.   Social History Main Topics  . Smoking status: Former Smoker -- 1.00 packs/day for 20 years    Types: Cigarettes    Quit date: 01/25/1970  . Smokeless tobacco: Never Used  . Alcohol Use: No  . Drug Use: No  . Sexual Activity: Not on file   Other Topics Concern  . Not on file   Social History Narrative   Family: Single never married, no children, cat and rehabs turtles and tortoise   Went to [employer] in [employer] and social work, some business courses at [employer], EMT for 6  years.    LIves alone. Completely independent.    Lives in retirement community.       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 Colon cancer Mother     and father  . Hyperlipidemia Mother   . Hypertension Mother   . Heart disease Mother   . Stroke Mother   . Diabetes Mother   . Colon cancer Father   . Arthritis Father   . Hyperlipidemia Father   . Hypertension Father   . Heart disease Father   . Stroke Father   . Atopy Neg Hx     ROS: no fevers or chills, productive cough, hemoptysis, dysphasia, odynophagia, melena, hematochezia,  dysuria, hematuria, rash, seizure activity, orthopnea, PND, pedal edema, claudication. Remaining systems are negative.  Physical Exam: Well-developed well-nourished in no acute distress.  Skin is warm and dry.  HEENT is normal.  Neck is supple.  Chest is clear to auscultation with normal expansion.  Cardiovascular exam is regular rate and rhythm.  Abdominal exam nontender or distended. No masses palpated. Extremities show no edema. neuro grossly intact  ECG 04/18/2015-sinus bradycardia with nonspecific ST changes.

## 2015-04-28 ENCOUNTER — Ambulatory Visit (INDEPENDENT_AMBULATORY_CARE_PROVIDER_SITE_OTHER): Payer: Medicare Other | Admitting: Cardiology

## 2015-04-28 ENCOUNTER — Encounter: Payer: Self-pay | Admitting: Cardiology

## 2015-04-28 VITALS — BP 136/76 | HR 72 | Ht 61.0 in | Wt 124.5 lb

## 2015-04-28 DIAGNOSIS — R0600 Dyspnea, unspecified: Secondary | ICD-10-CM

## 2015-04-28 DIAGNOSIS — I2583 Coronary atherosclerosis due to lipid rich plaque: Secondary | ICD-10-CM

## 2015-04-28 DIAGNOSIS — E785 Hyperlipidemia, unspecified: Secondary | ICD-10-CM

## 2015-04-28 DIAGNOSIS — I2510 Atherosclerotic heart disease of native coronary artery without angina pectoris: Secondary | ICD-10-CM | POA: Diagnosis not present

## 2015-04-28 DIAGNOSIS — I679 Cerebrovascular disease, unspecified: Secondary | ICD-10-CM | POA: Diagnosis not present

## 2015-04-28 DIAGNOSIS — I10 Essential (primary) hypertension: Secondary | ICD-10-CM | POA: Diagnosis not present

## 2015-04-28 MED ORDER — NEBIVOLOL HCL 2.5 MG PO TABS: 2.5000 mg | ORAL_TABLET | Freq: Every day | ORAL | Status: DC

## 2015-04-28 MED ORDER — VALSARTAN 80 MG PO TABS: 80.0000 mg | ORAL_TABLET | Freq: Every day | ORAL | Status: DC

## 2015-04-28 NOTE — Assessment & Plan Note (Signed)
Continue aspirin and statin. 

## 2015-04-28 NOTE — Assessment & Plan Note (Signed)
Continue aspirin and statin. During her recent hospitalization her initial electrocardiogram did show increase ST T-wave changes. We will arrange a nuclear study for risk stratification.

## 2015-04-28 NOTE — Patient Instructions (Signed)
Medication Instructions:   START BYSTOLIC 2.5 MG ONCE DAILY  START DIOVAN 80 MG ONCE DAILY  Labwork:  Your physician recommends that you return for lab work in: [place]  Testing/Procedures:  Your physician has requested that you have a lexiscan myoview. For further information please visit [email or web address]. Please follow instruction sheet, as given.    Follow-Up:  Your physician wants you to follow-up in: 3 [place] will receive a reminder letter in the mail two months in advance. If you don't receive a letter, please call our office to schedule the follow-up appointment.

## 2015-04-28 NOTE — Assessment & Plan Note (Signed)
Continue statin. 

## 2015-04-28 NOTE — Assessment & Plan Note (Signed)
Patient's blood pressure is mildly elevated today. Her bysystolic and Diovan were discontinued at recent hospitalization because of bradycardia and hypotension. Resume bystolic 2.5 mg daily and diovan 80 mg daily; Bmet one week. Follow blood pressure at home and adjust regimen as needed.

## 2015-04-30 ENCOUNTER — Telehealth (HOSPITAL_COMMUNITY): Payer: Self-pay

## 2015-04-30 NOTE — Telephone Encounter (Signed)
Encounter complete. 

## 2015-05-02 ENCOUNTER — Inpatient Hospital Stay (HOSPITAL_COMMUNITY): Admission: RE | Admit: 2015-05-02 | Payer: Medicare Other | Source: Ambulatory Visit

## 2015-05-06 ENCOUNTER — Telehealth (HOSPITAL_COMMUNITY): Payer: Self-pay

## 2015-05-06 DIAGNOSIS — R0600 Dyspnea, unspecified: Secondary | ICD-10-CM | POA: Diagnosis not present

## 2015-05-06 LAB — BASIC METABOLIC PANEL
BUN: 16 mg/dL (ref 7–25)
CHLORIDE: 103 mmol/L (ref 98–110)
CO2: 21 mmol/L (ref 20–31)
CREATININE: 0.71 mg/dL (ref 0.60–0.93)
Calcium: 9.4 mg/dL (ref 8.6–10.4)
GLUCOSE: 94 mg/dL (ref 65–99)
POTASSIUM: 4.2 mmol/L (ref 3.5–5.3)
Sodium: 137 mmol/L (ref 135–146)

## 2015-05-06 NOTE — Telephone Encounter (Signed)
Encounter complete. 

## 2015-05-08 ENCOUNTER — Ambulatory Visit (HOSPITAL_COMMUNITY)
Admission: RE | Admit: 2015-05-08 | Discharge: 2015-05-08 | Disposition: A | Discharge status: Home / Self Care | Payer: Medicare Other | Source: Ambulatory Visit | Attending: Cardiology | Admitting: Cardiology

## 2015-05-08 DIAGNOSIS — R0602 Shortness of breath: Secondary | ICD-10-CM | POA: Diagnosis not present

## 2015-05-08 DIAGNOSIS — Z87891 Personal history of nicotine dependence: Secondary | ICD-10-CM | POA: Diagnosis not present

## 2015-05-08 DIAGNOSIS — I779 Disorder of arteries and arterioles, unspecified: Secondary | ICD-10-CM | POA: Diagnosis not present

## 2015-05-08 DIAGNOSIS — Z8249 Family history of ischemic heart disease and other diseases of the circulatory system: Secondary | ICD-10-CM | POA: Diagnosis not present

## 2015-05-08 DIAGNOSIS — I10 Essential (primary) hypertension: Secondary | ICD-10-CM | POA: Insufficient documentation

## 2015-05-08 DIAGNOSIS — R0600 Dyspnea, unspecified: Secondary | ICD-10-CM | POA: Diagnosis not present

## 2015-05-08 LAB — MYOCARDIAL PERFUSION IMAGING
CHL CUP NUCLEAR SDS: 3
CHL CUP NUCLEAR SRS: 1
CHL CUP RESTING HR STRESS: 76 {beats}/min
LV sys vol: 20 mL
LVDIAVOL: 56 mL (ref 46–106)
Peak HR: 107 {beats}/min
SSS: 4
TID: 1

## 2015-05-08 MED ORDER — TECHNETIUM TC 99M SESTAMIBI GENERIC - CARDIOLITE
31.6000 | Freq: Once | INTRAVENOUS | Status: AC | PRN
  Administered 2015-05-08: 32 via INTRAVENOUS

## 2015-05-08 MED ORDER — REGADENOSON 0.4 MG/5ML IV SOLN
0.4000 mg | Freq: Once | INTRAVENOUS | Status: AC
  Administered 2015-05-08: 0.4 mg via INTRAVENOUS

## 2015-05-08 MED ORDER — TECHNETIUM TC 99M SESTAMIBI GENERIC - CARDIOLITE
10.6000 | Freq: Once | INTRAVENOUS | Status: AC | PRN
  Administered 2015-05-08: 11 via INTRAVENOUS

## 2015-05-08 MED ORDER — AMINOPHYLLINE 25 MG/ML IV SOLN
75.0000 mg | Freq: Once | INTRAVENOUS | Status: AC
  Administered 2015-05-08: 75 mg via INTRAVENOUS

## 2015-05-15 ENCOUNTER — Encounter: Payer: Self-pay | Admitting: Internal Medicine

## 2015-05-15 ENCOUNTER — Ambulatory Visit (INDEPENDENT_AMBULATORY_CARE_PROVIDER_SITE_OTHER): Payer: Medicare Other | Admitting: Internal Medicine

## 2015-05-15 VITALS — BP 130/60 | HR 71 | Ht 61.0 in | Wt 123.0 lb

## 2015-05-15 DIAGNOSIS — J479 Bronchiectasis, uncomplicated: Secondary | ICD-10-CM

## 2015-05-15 DIAGNOSIS — J471 Bronchiectasis with (acute) exacerbation: Secondary | ICD-10-CM | POA: Diagnosis not present

## 2015-05-15 DIAGNOSIS — A310 Pulmonary mycobacterial infection: Secondary | ICD-10-CM

## 2015-05-15 NOTE — Patient Instructions (Signed)
Plan A = Automatic =  symbicort 160 Take 2 puffs first thing in am and then another 2 puffs about 12 hours later.    Plan B = Backup Only use your albuterol (proair) as a rescue medication to be used if you can't catch your breath by resting or doing a relaxed purse lip breathing pattern.  - The less you use it, the better it will work when you need it. - Ok to use the inhaler up to 2 puffs  every 4 hours if you must but call for appointment if use goes up over your usual need - Don't leave home without it !!  (think of it like the spare tire for your car)     For congested/rattling cough > either mucinex or mucinex dm up to 1200 mg every 12 hours as needs but use the flutter as much as possible  For dry raspy cough > benzoate 100 mg up to 4 x daily   Please schedule a follow up visit in 3 months but call sooner if needed

## 2015-05-15 NOTE — Assessment & Plan Note (Signed)
-   PFT's 10/14/2010  FEV1  1.25 (68%) and ratio 58% and DLCO 90%     - PFT's 10/29/2011  FEV1  1.33 (74%) and ratio 57 % and DLCO 93%    - PFTs 08/14/2013   FEV1  1.16 (60%) and ratio 61 with dlco 83%     - Flutter valve added 09/03/14      - alpha one   01/14/2015 >  MM, level 147     - IgE  01/14/15  11       - 05/15/2015  After extensive coaching HFA effectiveness =    90%    Back near but not at baseline but this is to be expected given severity of flu requring inpt rx  I had an extended discussion with the patient reviewing all relevant studies completed to date and  lasting 15 to 20 minutes of a 25 minute visit    Each maintenance medication was reviewed in detail including most importantly the difference between maintenance and prns and under what circumstances the prns are to be triggered using an action plan format that is not reflected in the computer generated alphabetically organized AVS.    Please see instructions for details which were reviewed in writing and the patient given a copy highlighting the part that I personally wrote and discussed at today's ov.

## 2015-05-15 NOTE — Assessment & Plan Note (Signed)
-  08/08/07 FOB with classic cobblestoning and MAI on culture.  - Rx 08/2007   To 08/2009 with ETH/Zmax - restarted empirically 05/31/14 > stopped 09/03/14 no benefit perceived, no change on cxr   For now just using zpak prn flare which seems to be working well > no change in recs

## 2015-05-15 NOTE — Progress Notes (Signed)
Subjective:     Patient ID: Belinda Day, female   DOB: 24-Jul-1945    MRN: [id number]   Brief patient profile:  68  yowf quit smoking 1972 with documented right middle lobe  syndrome and evidence of bronchiectasis by CT scan in March 2002   History of Present Illness  08/08/07 FOB with classic cobblestoning and MAI on culture.   08/15/07 given Levaquin x 10 days with resolution bloody mucus, but "felt she had flu the whole time" with aches, feverish   August 29, 2007 ov: first post bronch co still coughing up mucus clear and initiate rx with symbicort/ Vevay   October 19, 2007 ov no cough , sob, feeling great but no improvement on cxr   December 07, 2007 ov feeling great, minimal am cough not productive. No sob.   Opth eval, labs ok 10/2007   September 06, 2008 ov overall better over the last year, less tendency to exac on zmax and ethambutol. rec complete another year > satisfied improved 90% and stopped zmax and eth 08/2009   05/31/2014 f/u ov/Belinda Day re: bronchiectasis/chronic airflow obst/  requesting to restart maint zmax/eth Chief Complaint  Patient presents with  . Follow-up    cough still there; SOB at times   Cough worse esp in am > yellow mucus x sev tsp x months  Rare saba need on symbicort 160 2bid rec Zithromax 250 mg daily and ethambutol is 400mg  twice x 3 months  See your eye doctor as soon as possible to let him know you have started ethambutol and stop it if having any vision problems     09/03/2014 f/u ov/Belinda Day re: bronchiectasis/ airflow obst/ no better cough on zmax/etham  Chief Complaint  Patient presents with  . Follow-up    Pt states that her cough comes and goes and is occ prod with light yellow sputum.    Breathing is worse in heat / takes one hour to get mucus up each am  rec Only use your albuterol as a rescue medication  Stay on symbicort Add Try prilosec otc 20mg   Take 30-60 min before first meal of the day and Pepcid ac (famotidine)  20 mg one @  bedtime until cough is completely gone for at least a week without the need for cough suppression Add flutter valve today to you am routine   In event of a flare of nasty mucus > zithromax 250 x 2 then one daily x 4 days and stop    10/15/2014  f/u ov/Belinda Day re: bronchiectasis / maint rx = symb 160 2id  Chief Complaint  Patient presents with  . Follow-up    Feeling a little better.Cough is better but worse in am-pale yellow and early afternoon-dry. Using flutter valve two times a day.Sob with exertion-same,no wheezing.Occass. has "spasms" across abdomen and back when coughing.Needs Proair and Symbicort refills.Wants flu shot  Not limited by breathing from desired activities   rec Take pepcid 20 mg after bfast and supper/or bedtime GERD (REFLUX) diet   01/14/2015  f/u ov/Belinda Day re: obst bronchiectasis/ symb 160 2bid/ rare saba/ has flutter helps some / much better p adding h2 bid   Chief Complaint  Patient presents with  . Follow-up    pt following for obstruvtive bronchitis: pt states she is finishing up a second round of azithromycin. pt sttaes she does still have a litttle prod cough yellowidh in color, and some wheezing. no c/o of SOB or chest tightness.  Not limited by breathing from desired activities  rec No change in recommendations keep the candy handy to prevent throat clearing   Admit date: 04/17/2015 Discharge date: 04/19/2015   Discharge Diagnoses:  Principal Problem:  Influenza A Active Problems:  MAI (mycobacterium avium-intracellulare) (Belinda Day)  Essential hypertension  CAD (coronary artery disease) s/p CABG 15 yrs ago  Hypothyroidism  GERD (gastroesophageal reflux disease)  Bronchiectasis chronic Hypocalcemia      05/15/2015  Post hosp f/u ov/Belinda Day re: symb  160 2bid Chief Complaint  Patient presents with  . Follow-up    Cough has been prod with clear, foamy sputum.  She has been using albuterol 2 x daily on average.   still needing more  albuterol than baseline despite good hfa technique with symbiort 160 and confirme using it 2 bid No noct resp complaints  Doe = MMRC2 = can't walk a nl pace on a flat grade s sob but does fine slow and flat eg grocery shopping    No obvious day to day or  daytime variabilty or assoc excess/ purulent sputum or mucus plugs  chest tightness,  overt sinus or hb symptoms. No unusual exp hx or h/o childhood pna/ asthma or premature birth to her knowledge.   Sleeping ok without nocturnal  or early am exacerbation  of respiratory  c/o's or need for noct saba. Also denies any obvious fluctuation of symptoms with weather or environmental changes or other aggravating or alleviating factors except as outlined above      ROS  The following are not active complaints unless bolded sore throat, dysphagia, dental problems, itching, sneezing,  nasal congestion or excess/ purulent secretions, ear ache,   fever, chills, sweats, unintended wt loss, pleuritic or exertional cp, hemoptysis,  orthopnea pnd or leg swelling, presyncope, palpitations, heartburn, abdominal pain, anorexia, nausea, vomiting, diarrhea  or change in bowel or urinary habits, change in stools or urine, dysuria,hematuria,  rash, arthralgias, visual complaints, headache, numbness weakness or ataxia or problems with walking or coordination,  change in mood/affect or memory.       Past Medical History:  Bronchiectasis see CT SE 04/13/00  - HFA 75% November 19, 2009  - alpha one screen 01/14/2015 >>>  - IgE 01/14/2015 >>>  MAI  - Rx Zmax and ETH 08/29/07 > 08/2009 restarted empirically 05/31/14 > 09/03/14 (no change in cough so just use zpak for flares)  - Eye eval   [place] cycles of [place] April 02, 2010  HEALTH MAINTENANCE...........................[name] 10/2007  - Pneumovax 2005   and 10/29/2011 age 8, prevnar 08/16/2013  CAD  Hyperlipidemia  Hypertension  History of cough with ACE  inhibition.  Gastroesophageal reflux disease         Objective:   Physical Exam   In general she is an extremely  pleasant ambulatory white female in no acute distress.  Vital signs reviewed    Wt 130 October 19, 2007>140 March 31, 2010 > 127 07/16/2010 > 10/14/2010  120 > 05/04/2011  117 > 07/30/2011  120 > 10/29/2011 118 > 130  05/01/2012 > 12/12/2012 132 >  08/14/13 137 >    02/20/2014  137 >  05/31/2014 134 > 09/03/2014    140 > 10/15/2014 137 > 01/14/2015 137 >  05/15/2015 123   HEENT: nl dentition, turbinates, and orophanx. Nl external ear canals without cough reflex  Neck without JVD/Nodes/TM  Lungs   With a few insp pops bilaterally with mid /late exp  rhonchi bilaterally  RRR no s3 or murmur or increase in P2  Abd soft and benign with nl excursion in the supine position. No bruits or organomegaly  Ext warm without calf tenderness, cyanosis clubbing or edema       I personally reviewed images and agree with radiology impression as follows:  CT Chest   04/17/15 1. No evidence of acute pulmonary emboli. 2. Trace right pleural effusion. 3. Marked chronic bronchiectasis and volume loss in the right middle lobe with milder bronchiectasis, bronchial wall thickening, and nodular densities throughout the right upper and right lower lobe suggestive of chronic endobronchial/atypical mycobacterial infection.               Assessment:

## 2015-05-29 ENCOUNTER — Other Ambulatory Visit: Payer: Self-pay

## 2015-05-29 MED ORDER — VALSARTAN 80 MG PO TABS: 80.0000 mg | ORAL_TABLET | Freq: Every day | ORAL | Status: DC

## 2015-05-29 MED ORDER — NEBIVOLOL HCL 2.5 MG PO TABS: 2.5000 mg | ORAL_TABLET | Freq: Every day | ORAL | Status: DC

## 2015-05-29 NOTE — Telephone Encounter (Signed)
Lelon Perla, MD at 04/23/2015 4:30 PM  Patient Instructions     Medication Instructions:   START BYSTOLIC 2.5 MG ONCE DAILY  START DIOVAN 80 MG ONCE DAILY

## 2015-06-05 ENCOUNTER — Telehealth: Payer: Self-pay | Admitting: General Practice

## 2015-06-05 NOTE — Telephone Encounter (Signed)
Yes thanks 

## 2015-06-06 ENCOUNTER — Telehealth: Payer: Self-pay | Admitting: General Practice

## 2015-06-06 MED ORDER — ALPRAZOLAM 0.25 MG PO TABS: ORAL_TABLET | ORAL | Status: DC

## 2015-06-06 NOTE — Telephone Encounter (Signed)
Medication refilled

## 2015-06-06 NOTE — Telephone Encounter (Signed)
Error

## 2015-06-06 NOTE — Addendum Note (Signed)
Addended by: Clyde Lundborg A on: 06/06/2015 08:38 AM   Modules accepted: Orders

## 2015-07-16 DIAGNOSIS — L718 Other rosacea: Secondary | ICD-10-CM | POA: Diagnosis not present

## 2015-07-16 DIAGNOSIS — D692 Other nonthrombocytopenic purpura: Secondary | ICD-10-CM | POA: Diagnosis not present

## 2015-07-16 DIAGNOSIS — D225 Melanocytic nevi of trunk: Secondary | ICD-10-CM | POA: Diagnosis not present

## 2015-07-16 DIAGNOSIS — Z85828 Personal history of other malignant neoplasm of skin: Secondary | ICD-10-CM | POA: Diagnosis not present

## 2015-07-16 DIAGNOSIS — L821 Other seborrheic keratosis: Secondary | ICD-10-CM | POA: Diagnosis not present

## 2015-07-24 ENCOUNTER — Encounter: Payer: Self-pay | Admitting: *Deleted

## 2015-07-31 ENCOUNTER — Other Ambulatory Visit (INDEPENDENT_AMBULATORY_CARE_PROVIDER_SITE_OTHER): Payer: Medicare Other

## 2015-07-31 ENCOUNTER — Other Ambulatory Visit: Payer: Self-pay | Admitting: *Deleted

## 2015-07-31 DIAGNOSIS — R739 Hyperglycemia, unspecified: Secondary | ICD-10-CM

## 2015-07-31 DIAGNOSIS — E039 Hypothyroidism, unspecified: Secondary | ICD-10-CM

## 2015-07-31 DIAGNOSIS — E785 Hyperlipidemia, unspecified: Secondary | ICD-10-CM

## 2015-07-31 LAB — COMPREHENSIVE METABOLIC PANEL
ALBUMIN: 3.9 g/dL (ref 3.5–5.2)
ALK PHOS: 95 U/L (ref 39–117)
ALT: 19 U/L (ref 0–35)
AST: 24 U/L (ref 0–37)
BILIRUBIN TOTAL: 0.6 mg/dL (ref 0.2–1.2)
BUN: 20 mg/dL (ref 6–23)
CO2: 28 mEq/L (ref 19–32)
Calcium: 9.4 mg/dL (ref 8.4–10.5)
Chloride: 100 mEq/L (ref 96–112)
Creatinine, Ser: 0.86 mg/dL (ref 0.40–1.20)
GFR: 68.87 mL/min (ref >60.00)
GLUCOSE: 98 mg/dL (ref 70–99)
POTASSIUM: 3.5 meq/L (ref 3.5–5.1)
Sodium: 134 mEq/L — ABNORMAL LOW (ref 135–145)
TOTAL PROTEIN: 7 g/dL (ref 6.0–8.3)

## 2015-07-31 LAB — LDL CHOLESTEROL, DIRECT: Direct LDL: 88 mg/dL

## 2015-07-31 LAB — HEMOGLOBIN A1C: HEMOGLOBIN A1C: 6.1 % (ref 4.6–6.5)

## 2015-07-31 LAB — TSH: TSH: 0.86 u[IU]/mL (ref 0.35–4.50)

## 2015-07-31 MED ORDER — SYNTHROID 75 MCG PO TABS: ORAL_TABLET | ORAL | Status: DC

## 2015-07-31 MED ORDER — AMITRIPTYLINE HCL 50 MG PO TABS: 50.0000 mg | ORAL_TABLET | Freq: Every day | ORAL | Status: DC

## 2015-08-05 ENCOUNTER — Ambulatory Visit: Payer: Medicare Other | Admitting: Family Medicine

## 2015-08-05 NOTE — Progress Notes (Signed)
HPI: FU CAD; history of coronary artery disease status post coronary artery bypass graft in 2002. Carotid Dopplers December 2015 showed no significant obstruction. Echocardiogram March 2017 showed normal LV function and trace aortic insufficiency. CTA March 2017 showed no pulmonary embolus. There was bronchiectasis noted. Nuclear study April 2017Showed ejection fraction 64% and no ischemia or infarction. Since last seen the patient denies any dyspnea on exertion, orthopnea, PND, pedal edema, palpitations, syncope or chest pain.   Current Outpatient Prescriptions  Medication Sig Dispense Refill  . ALPRAZolam (XANAX) 0.25 MG tablet Take 0.25 mg by mouth at bedtime.    Marland Kitchen acetaminophen (TYLENOL) 650 MG CR tablet every 8 (eight) hours as needed for pain.     Marland Kitchen albuterol (PROAIR HFA) 108 (90 BASE) MCG/ACT inhaler Inhale 2 puffs into the lungs every 6 (six) hours as needed for wheezing or shortness of breath. 1 Inhaler 11  . amitriptyline (ELAVIL) 50 MG tablet Take 1 tablet (50 mg total) by mouth at bedtime. 90 tablet 0  . aspirin 81 MG tablet Take 81 mg by mouth daily.     Marland Kitchen atorvastatin (LIPITOR) 80 MG tablet Take 1 tablet (80 mg total) by mouth daily. 90 tablet 3  . budesonide-formoterol (SYMBICORT) 160-4.5 MCG/ACT inhaler Inhale 2 puffs into the lungs 2 (two) times daily. 10.2 g 11  . guaiFENesin (MUCINEX) 600 MG 12 hr tablet Take 600 mg by mouth 2 (two) times daily.    . nebivolol (BYSTOLIC) 2.5 MG tablet Take 1 tablet (2.5 mg total) by mouth daily. 90 tablet 3  . Respiratory Therapy Supplies (FLUTTER) DEVI Use as directed 1 each 0  . SYNTHROID 75 MCG tablet One tab by mouth once daily 6 days a week. 1.5 tab on 7th day 95 tablet 0  . valsartan (DIOVAN) 80 MG tablet Take 1 tablet (80 mg total) by mouth daily. 90 tablet 3   No current facility-administered medications for this visit.     Past Medical History  Diagnosis Date  . Bronchiectasis     oxygen at night in the past  . MAI  (mycobacterium avium-intracellulare) ([place])   . CAD (coronary artery disease)   . Hyperlipidemia   . HTN (hypertension)   . GERD (gastroesophageal reflux disease)   . History of shingles 04/2012  . Hypothyroidism   . Neuritis of upper extremity   . Diverticulitis 2014    Past Surgical History  Procedure Laterality Date  . Coronary artery bypass graft  2004    x3 CABG    Social History   Social History  . Marital Status: Single    Spouse Name: N/A  . Number of Children: N/A  . Years of Education: N/A   Occupational History  . Not on file.   Social History Main Topics  . Smoking status: Former Smoker -- 1.00 packs/day for 20 years    Types: Cigarettes    Quit date: 01/25/1970  . Smokeless tobacco: Never Used  . Alcohol Use: No  . Drug Use: No  . Sexual Activity: Not on file   Other Topics Concern  . Not on file   Social History Narrative   Family: Single never married, no children, cat and rehabs turtles and tortoise   Went to [employer] in [employer] and social work, some business courses at [employer], EMT for 6 years.    LIves alone. Completely independent.    Lives in retirement community.       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 Colon cancer Mother   . Hyperlipidemia Mother   . Hypertension Mother   . Heart disease Mother   . Stroke Mother   . Diabetes Mother   . Colon cancer Father   . Arthritis Father   . Hyperlipidemia Father   . Hypertension Father   . Heart disease Father   . Stroke Father   . Atopy Neg Hx     ROS: Chronic cough with hemoptysis due to bronchiectasis but no fevers or chills, hemoptysis, dysphasia, odynophagia, melena, hematochezia, dysuria, hematuria, rash, seizure activity, orthopnea, PND, pedal edema, claudication. Remaining systems are negative.  Physical  Exam: Well-developed well-nourished in no acute distress.  Skin is warm and dry.  HEENT is normal.  Neck is supple.  Chest is clear to auscultation with normal expansion.  Cardiovascular exam is regular rate and rhythm.  Abdominal exam nontender or distended. No masses palpated. Extremities show no edema. neuro grossly intact  A/P   1 coronary artery disease-continue aspirin and statin.  2 hyperlipidemia-continue statin.  3 hypertension-blood pressure controlled.Continue present medications.  4 carotid artery disease-continue aspirin and statin.  Kirk Ruths, MD

## 2015-08-08 ENCOUNTER — Ambulatory Visit (INDEPENDENT_AMBULATORY_CARE_PROVIDER_SITE_OTHER): Payer: Medicare Other | Admitting: Cardiology

## 2015-08-08 VITALS — BP 124/70 | HR 68 | Ht 61.0 in | Wt 122.6 lb

## 2015-08-08 DIAGNOSIS — I10 Essential (primary) hypertension: Secondary | ICD-10-CM | POA: Diagnosis not present

## 2015-08-08 DIAGNOSIS — E785 Hyperlipidemia, unspecified: Secondary | ICD-10-CM | POA: Diagnosis not present

## 2015-08-08 DIAGNOSIS — I2510 Atherosclerotic heart disease of native coronary artery without angina pectoris: Secondary | ICD-10-CM | POA: Diagnosis not present

## 2015-08-08 NOTE — Patient Instructions (Addendum)
Medication Instructions:  Your physician recommends that you continue on your current medications as directed. Please refer to the Current Medication list given to you today.  Labwork: None  Testing/Procedures: None  Follow-Up: Your physician wants you to follow-up in: 6 month ov You will receive a reminder letter in the mail two months in advance. If you don't receive a letter, please call our office to schedule the follow-up appointment.  If you need a refill on your cardiac medications before your next appointment, please call your pharmacy.

## 2015-08-14 ENCOUNTER — Ambulatory Visit (INDEPENDENT_AMBULATORY_CARE_PROVIDER_SITE_OTHER)
Admission: RE | Admit: 2015-08-14 | Discharge: 2015-08-14 | Disposition: A | Discharge status: Home / Self Care | Payer: Medicare Other | Source: Ambulatory Visit | Attending: Internal Medicine | Admitting: Internal Medicine

## 2015-08-14 ENCOUNTER — Ambulatory Visit (INDEPENDENT_AMBULATORY_CARE_PROVIDER_SITE_OTHER): Payer: Medicare Other | Admitting: Internal Medicine

## 2015-08-14 ENCOUNTER — Encounter: Payer: Self-pay | Admitting: Internal Medicine

## 2015-08-14 VITALS — BP 120/64 | HR 60 | Ht 61.0 in | Wt 124.0 lb

## 2015-08-14 DIAGNOSIS — A310 Pulmonary mycobacterial infection: Secondary | ICD-10-CM

## 2015-08-14 DIAGNOSIS — J471 Bronchiectasis with (acute) exacerbation: Secondary | ICD-10-CM

## 2015-08-14 DIAGNOSIS — J479 Bronchiectasis, uncomplicated: Secondary | ICD-10-CM

## 2015-08-14 DIAGNOSIS — R042 Hemoptysis: Secondary | ICD-10-CM | POA: Diagnosis not present

## 2015-08-14 MED ORDER — AZITHROMYCIN 250 MG PO TABS: ORAL_TABLET | ORAL | Status: DC

## 2015-08-14 NOTE — Progress Notes (Signed)
Quick Note:  Spoke with pt and notified of results per Dr. Wert. Pt verbalized understanding and denied any questions.  ______ 

## 2015-08-14 NOTE — Patient Instructions (Signed)
Hold aspirin anytime you notice more blood than usual   For definite change in mucus > Zpak   Please remember to go to the  x-ray department downstairs for your tests - we will call you with the results when they are available.

## 2015-08-14 NOTE — Progress Notes (Signed)
Subjective:     Patient ID: Belinda Day, female   DOB: 24-Jul-1945    MRN: [id number]   Brief patient profile:  68  yowf quit smoking 1972 with documented right middle lobe  syndrome and evidence of bronchiectasis by CT scan in March 2002   History of Present Illness  08/08/07 FOB with classic cobblestoning and MAI on culture.   08/15/07 given Levaquin x 10 days with resolution bloody mucus, but "felt she had flu the whole time" with aches, feverish   August 29, 2007 ov: first post bronch co still coughing up mucus clear and initiate rx with symbicort/ Vevay   October 19, 2007 ov no cough , sob, feeling great but no improvement on cxr   December 07, 2007 ov feeling great, minimal am cough not productive. No sob.   Opth eval, labs ok 10/2007   September 06, 2008 ov overall better over the last year, less tendency to exac on zmax and ethambutol. rec complete another year > satisfied improved 90% and stopped zmax and eth 08/2009   05/31/2014 f/u ov/Belinda Day re: bronchiectasis/chronic airflow obst/  requesting to restart maint zmax/eth Chief Complaint  Patient presents with  . Follow-up    cough still there; SOB at times   Cough worse esp in am > yellow mucus x sev tsp x months  Rare saba need on symbicort 160 2bid rec Zithromax 250 mg daily and ethambutol is 400mg  twice x 3 months  See your eye doctor as soon as possible to let him know you have started ethambutol and stop it if having any vision problems     09/03/2014 f/u ov/Belinda Day re: bronchiectasis/ airflow obst/ no better cough on zmax/etham  Chief Complaint  Patient presents with  . Follow-up    Pt states that her cough comes and goes and is occ prod with light yellow sputum.    Breathing is worse in heat / takes one hour to get mucus up each am  rec Only use your albuterol as a rescue medication  Stay on symbicort Add Try prilosec otc 20mg   Take 30-60 min before first meal of the day and Pepcid ac (famotidine)  20 mg one @  bedtime until cough is completely gone for at least a week without the need for cough suppression Add flutter valve today to you am routine   In event of a flare of nasty mucus > zithromax 250 x 2 then one daily x 4 days and stop    10/15/2014  f/u ov/Belinda Day re: bronchiectasis / maint rx = symb 160 2id  Chief Complaint  Patient presents with  . Follow-up    Feeling a little better.Cough is better but worse in am-pale yellow and early afternoon-dry. Using flutter valve two times a day.Sob with exertion-same,no wheezing.Occass. has "spasms" across abdomen and back when coughing.Needs Proair and Symbicort refills.Wants flu shot  Not limited by breathing from desired activities   rec Take pepcid 20 mg after bfast and supper/or bedtime GERD (REFLUX) diet   01/14/2015  f/u ov/Belinda Day re: obst bronchiectasis/ symb 160 2bid/ rare saba/ has flutter helps some / much better p adding h2 bid   Chief Complaint  Patient presents with  . Follow-up    pt following for obstruvtive bronchitis: pt states she is finishing up a second round of azithromycin. pt sttaes she does still have a litttle prod cough yellowidh in color, and some wheezing. no c/o of SOB or chest tightness.  Not limited by breathing from desired activities  rec No change in recommendations keep the candy handy to prevent throat clearing   Admit date: 04/17/2015 Discharge date: 04/19/2015   Discharge Diagnoses:  Principal Problem:  Influenza A Active Problems:  MAI (mycobacterium avium-intracellulare) ([place])  Essential hypertension  CAD (coronary artery disease) s/p CABG 15 yrs ago  Hypothyroidism  GERD (gastroesophageal reflux disease)  Bronchiectasis chronic Hypocalcemia      05/15/2015  Post hosp f/u ov/Belinda Day re: symb  160 2bid Chief Complaint  Patient presents with  . Follow-up    Cough has been prod with clear, foamy sputum.  She has been using albuterol 2 x daily on average.   still needing more  albuterol than baseline despite good hfa technique with symbiort 160 and confirme using it 2 bid No noct resp complaints  Doe = MMRC2 = can't walk a nl pace on a flat grade s sob but does fine slow and flat eg grocery shopping  rec Plan A = Automatic =  symbicort 160 Take 2 puffs first thing in am and then another 2 puffs about 12 hours later.  Plan B = Backup Only use your albuterol (proair) as a rescue medication  For congested/rattling cough > either mucinex or mucinex dm up to 1200 mg every 12 hours as needs but use the flutter as much as possible For dry raspy cough > benzoate 100 mg up to 4 x daily    08/14/2015  f/u ov/Belinda Day re:  obst bronchiectasis on symbicort 160 2bid  Chief Complaint  Patient presents with  . Follow-up    Cough still the same,coughed up bright red bld. on 2 occassions since last ov,lasted 1 day each-felt like sorethroat at the time,sob occass.,worse with humidity,denies cp or tightness,no fcs.Saw Dr. Stanford Breed last wk. everything was good.  just two episodes hemoptysis  x one month while on asa but the total may approach 2 tbsp in 24 h  Not limited by breathing from desired activities     No obvious day to day or  daytime variabilty or assoc excess/ purulent sputum or mucus plugs  chest tightness,  overt sinus or hb symptoms. No unusual exp hx or h/o childhood pna/ asthma or premature birth to her knowledge.   Sleeping ok without nocturnal  or early am exacerbation  of respiratory  c/o's or need for noct saba. Also denies any obvious fluctuation of symptoms with weather or environmental changes or other aggravating or alleviating factors except as outlined above      ROS  The following are not active complaints unless bolded sore throat, dysphagia, dental problems, itching, sneezing,  nasal congestion or excess/ purulent secretions, ear ache,   fever, chills, sweats, unintended wt loss, pleuritic or exertional cp, hemoptysis,  orthopnea pnd or leg swelling,  presyncope, palpitations, heartburn, abdominal pain, anorexia, nausea, vomiting, diarrhea  or change in bowel or urinary habits, change in stools or urine, dysuria,hematuria,  rash, arthralgias, visual complaints, headache, numbness weakness or ataxia or problems with walking or coordination,  change in mood/affect or memory.       Past Medical History:  Bronchiectasis see CT SE 04/13/00  - HFA 75% November 19, 2009  - alpha one screen 01/14/2015 >>>  - IgE 01/14/2015 >>>  MAI  - Rx Zmax and ETH 08/29/07 > 08/2009 restarted empirically 05/31/14 > 09/03/14 (no change in cough so just use zpak for flares)  - Eye eval   10/[facility]....[place] try  cycles of cipro April 02, 2010  HEALTH MAINTENANCE...........................[name] [place] 10/2007  - Pneumovax 2005   and 10/29/2011 age 42, prevnar 08/16/2013  CAD  Hyperlipidemia  Hypertension  History of cough with ACE inhibition.  Gastroesophageal reflux disease         Objective:   Physical Exam   In general she is an extremely  pleasant ambulatory white female in no acute distress.  Vital signs reviewed    Wt 130 October 19, 2007>140 March 31, 2010 > 127 07/16/2010 > 10/14/2010  120 > 05/04/2011  117 > 07/30/2011  120 > 10/29/2011 118 > 130  05/01/2012 > 12/12/2012 132 >  08/14/13 137 >    02/20/2014  137 >  05/31/2014 134 > 09/03/2014    140 > 10/15/2014 137 > 01/14/2015 137 >  05/15/2015 123 >08/14/2015  124   HEENT: nl dentition, turbinates, and orophanx. Nl external ear canals without cough reflex  Neck without JVD/Nodes/TM  Lungs   With a few insp pops bilaterally with  Very min mid /late exp rhonchi bilaterally  RRR no s3 or murmur or increase in P2  Abd soft and benign with nl excursion in the supine position. No bruits or organomegaly  Ext warm without calf tenderness, cyanosis clubbing or edema            CXR PA and Lateral:   08/14/2015 :    I personally reviewed images and agree with radiology impression as  follows:    Stable chronic changes within both lungs consistent with previous infection. Previous CABG. No acute cardiopulmonary abnormality.       Assessment:

## 2015-08-15 ENCOUNTER — Ambulatory Visit (INDEPENDENT_AMBULATORY_CARE_PROVIDER_SITE_OTHER): Payer: Medicare Other | Admitting: Family Medicine

## 2015-08-15 ENCOUNTER — Encounter: Payer: Self-pay | Admitting: Family Medicine

## 2015-08-15 VITALS — BP 130/80 | HR 63 | Temp 98.5°F | Ht 61.0 in | Wt 122.0 lb

## 2015-08-15 DIAGNOSIS — E785 Hyperlipidemia, unspecified: Secondary | ICD-10-CM

## 2015-08-15 DIAGNOSIS — R739 Hyperglycemia, unspecified: Secondary | ICD-10-CM

## 2015-08-15 DIAGNOSIS — E039 Hypothyroidism, unspecified: Secondary | ICD-10-CM | POA: Diagnosis not present

## 2015-08-15 DIAGNOSIS — I10 Essential (primary) hypertension: Secondary | ICD-10-CM

## 2015-08-15 NOTE — Assessment & Plan Note (Signed)
S: reasonably controlled on atorvastatin 80mg . No myalgias.   A/P: ideally LDL would be <70 but is 88 and on max dose statin- will not adjust further. Could consider zetia. nutritoinal counseling to boost veggies and follow up awv

## 2015-08-15 NOTE — Progress Notes (Signed)
Subjective:  Belinda Day is a 70 y.o. year old very pleasant female patient who presents for/with See problem oriented charting ROS- continued wheeze. No chest pain. No fever.see any ROS included in HPI as well.   Past Medical History-  Patient Active Problem List   Diagnosis Date Noted  . CAD (coronary artery disease) s/p CABG 07/03/2007    Priority: High  . Hyperglycemia 08/05/2014    Priority: Medium  . Insomnia 02/12/2013    Priority: Medium  . Nocturnal hypoxemia 12/12/2012    Priority: Medium  . Fibromyalgia 12/28/2011    Priority: Medium  . Hypothyroidism 04/06/2010    Priority: Medium  . MAI (mycobacterium avium-intracellulare) ([place]) 11/19/2009    Priority: Medium  . Hyperlipemia 07/03/2007    Priority: Medium  . Essential hypertension 07/03/2007    Priority: Medium  . Obstructive bronchiectasis ([place]) 07/03/2007    Priority: Medium  . Former smoker 08/05/2014    Priority: Low  . Constipation 05/02/2014    Priority: Low  . History of colonic polyps 05/02/2014    Priority: Low  . GERD (gastroesophageal reflux disease) 02/24/2014    Priority: Low  . Hemoptysis 02/24/2014    Priority: Low  . Benign paroxysmal positional vertigo 04/18/2013    Priority: Low  . Diverticulitis 12/21/2012    Priority: Low  . Osteopenia 08/14/2011    Priority: Low  . Influenza A 04/18/2015  . Influenza-like illness 04/17/2015  . Bronchiectasis with acute exacerbation ([place]) 04/17/2015  . Cerebrovascular disease 01/31/2015    Medications- reviewed and updated Current Outpatient Prescriptions  Medication Sig Dispense Refill  . acetaminophen (TYLENOL) 650 MG CR tablet every 8 (eight) hours as needed for pain.     Marland Kitchen albuterol (PROAIR HFA) 108 (90 BASE) MCG/ACT inhaler Inhale 2 puffs into the lungs every 6 (six) hours as needed for wheezing or shortness of breath. 1 Inhaler 11  . ALPRAZolam (XANAX) 0.25 MG tablet Take 0.25 mg by mouth at bedtime.    Marland Kitchen amitriptyline (ELAVIL) 50 MG  tablet Take 1 tablet (50 mg total) by mouth at bedtime. 90 tablet 0  . aspirin 81 MG tablet Take 81 mg by mouth daily.     Marland Kitchen atorvastatin (LIPITOR) 80 MG tablet Take 1 tablet (80 mg total) by mouth daily. 90 tablet 3  . budesonide-formoterol (SYMBICORT) 160-4.5 MCG/ACT inhaler Inhale 2 puffs into the lungs 2 (two) times daily. 10.2 g 11  . guaiFENesin (MUCINEX) 600 MG 12 hr tablet Take 600 mg by mouth 2 (two) times daily.    . metFORMIN (GLUCOPHAGE) 500 MG tablet Take 250 mg by mouth daily with breakfast.    . nebivolol (BYSTOLIC) 2.5 MG tablet Take 1 tablet (2.5 mg total) by mouth daily. 90 tablet 3  . Respiratory Therapy Supplies (FLUTTER) DEVI Use as directed 1 each 0  . SYNTHROID 75 MCG tablet One tab by mouth once daily 6 days a week. 1.5 tab on 7th day 95 tablet 0  . valsartan (DIOVAN) 80 MG tablet Take 1 tablet (80 mg total) by mouth daily. 90 tablet 3  . azithromycin (ZITHROMAX) 250 MG tablet Take 2 on day one then 1 daily x 4 days (Patient not taking: Reported on 08/15/2015) 6 tablet 11   Objective: BP 130/80 mmHg  Pulse 63  Temp(Src) 98.5 F (36.9 C) (Oral)  Ht 5\' 1"  (1.549 m)  Wt 122 lb (55.339 kg)  BMI 23.06 kg/m2  SpO2 97% Gen: NAD, resting comfortably CV: RRR no murmurs rubs or gallops  Lungs: CTAB, prominent diffuse wheeze Abdomen: soft/nontender/nondistended/normal bowel sounds. thin Ext: no edema Skin: warm, dry, no rash Neuro: grossly normal, moves all extremities  Assessment/Plan:  Essential hypertension S: controlled on bystolic 2.5mg , valsartan 80mg  BP Readings from Last 3 Encounters:  08/15/15 130/80  08/14/15 120/64  08/08/15 124/70  A/P:Continue current meds:  Doing well on reduced doses from 5 mg and 160mg   Hyperglycemia S: started metformin 500mg - being proactive given DM in nonobese mother with last a1c 6.3- she stopped but then later started 1/2 tab due to dry skin, excess thirst, feet tingling at night- improved on medicine.  Lab Results  Component  Value Date   HGBA1C 6.1 07/31/2015  A/P: a1c has improved -continue current metformin 250mg - if needed adjustment would do BID dosing  Hyperlipemia S: reasonably controlled on atorvastatin 80mg . No myalgias.   A/P: ideally LDL would be <70 but is 88 and on max dose statin- will not adjust further. Could consider zetia. nutritoinal counseling to boost veggies and follow up awv  Hypothyroidism S: Lab Results  Component Value Date   TSH 0.86 07/31/2015   On thyroid medication- Synthroid 75 mcg ROS-No hair or nail changes. No heat/cold intolerance. No constipation or diarrhea. Denies shakiness or anxiety.  A/P: well controlled, continue current medication     Return in about 6 months (around 02/15/2016) for physical with labs at least a few days before.  Orders Placed This Encounter  Procedures  . Hemoglobin A1c    [place]    Standing Status: Future     Number of Occurrences:      Standing Expiration Date: 08/14/2016  . CBC    Standing Status: Future     Number of Occurrences:      Standing Expiration Date: 08/14/2016  . Comprehensive metabolic panel    [place]    Standing Status: Future     Number of Occurrences:      Standing Expiration Date: 08/14/2016  . Lipid panel    Standing Status: Future     Number of Occurrences:      Standing Expiration Date: 08/14/2016  . TSH    Standing Status: Future     Number of Occurrences:      Standing Expiration Date: 08/14/2016  . POCT Urinalysis Dipstick (Automated)    Standing Status: Future     Number of Occurrences:      Standing Expiration Date: 08/14/2016    Meds ordered this encounter  Medications  . metFORMIN (GLUCOPHAGE) 500 MG tablet    Sig: Take 250 mg by mouth daily with breakfast.    Return precautions advised.  Garret Reddish, MD

## 2015-08-15 NOTE — Patient Instructions (Signed)
No changes today- continue half dose of metformin  I would also like for you to sign up for an annual wellness visit on a Friday with our nurse Manuela Schwartz. This is a free benefit under medicare that may help [place] find additional ways to help you. I think the nutritional counseling may be of benefit to you to follow up on increasing those veggies!

## 2015-08-15 NOTE — Assessment & Plan Note (Signed)
S: Lab Results  Component Value Date   TSH 0.86 07/31/2015   On thyroid medication- Synthroid 75 mcg ROS-No hair or nail changes. No heat/cold intolerance. No constipation or diarrhea. Denies shakiness or anxiety.  A/P: well controlled, continue current medication

## 2015-08-15 NOTE — Assessment & Plan Note (Signed)
S: started metformin 500mg - being proactive given DM in nonobese mother with last a1c 6.3- she stopped but then later started 1/2 tab due to dry skin, excess thirst, feet tingling at night- improved on medicine.  Lab Results  Component Value Date   HGBA1C 6.1 07/31/2015  A/P: a1c has improved -continue current metformin 250mg - if needed adjustment would do BID dosing

## 2015-08-15 NOTE — Assessment & Plan Note (Signed)
S: controlled on bystolic 2.5mg , valsartan 80mg  BP Readings from Last 3 Encounters:  08/15/15 130/80  08/14/15 120/64  08/08/15 124/70  A/P:Continue current meds:  Doing well on reduced doses from 5 mg and 160mg 

## 2015-08-15 NOTE — Progress Notes (Signed)
Pre visit review using our clinic review tool, if applicable. No additional management support is needed unless otherwise documented below in the visit note. 

## 2015-08-17 ENCOUNTER — Encounter: Payer: Self-pay | Admitting: Internal Medicine

## 2015-08-17 NOTE — Assessment & Plan Note (Signed)
-   PFT's 10/14/2010  FEV1  1.25 (68%) and ratio 58% and DLCO 90%     - PFT's 10/29/2011  FEV1  1.33 (74%) and ratio 57 % and DLCO 93%    - PFTs 08/14/2013   FEV1  1.16 (60%) and ratio 61 with dlco 83%     - Flutter valve added 09/03/14      - alpha one   01/14/2015 >  MM, level 147     - IgE  01/14/15  11     - 05/15/2015  After extensive coaching HFA effectiveness =    90%   Adequate control on present rx, reviewed > no change in rx needed

## 2015-09-23 DIAGNOSIS — Z0100 Encounter for examination of eyes and vision without abnormal findings: Secondary | ICD-10-CM | POA: Diagnosis not present

## 2015-09-23 DIAGNOSIS — H2513 Age-related nuclear cataract, bilateral: Secondary | ICD-10-CM | POA: Diagnosis not present

## 2015-10-16 ENCOUNTER — Ambulatory Visit (INDEPENDENT_AMBULATORY_CARE_PROVIDER_SITE_OTHER): Payer: Medicare Other | Admitting: Internal Medicine

## 2015-10-16 ENCOUNTER — Telehealth: Payer: Self-pay | Admitting: *Deleted

## 2015-10-16 ENCOUNTER — Encounter: Payer: Self-pay | Admitting: Internal Medicine

## 2015-10-16 VITALS — BP 148/72 | HR 70 | Ht 61.0 in | Wt 126.0 lb

## 2015-10-16 DIAGNOSIS — J479 Bronchiectasis, uncomplicated: Secondary | ICD-10-CM

## 2015-10-16 DIAGNOSIS — J471 Bronchiectasis with (acute) exacerbation: Secondary | ICD-10-CM

## 2015-10-16 DIAGNOSIS — Z23 Encounter for immunization: Secondary | ICD-10-CM

## 2015-10-16 MED ORDER — ALBUTEROL SULFATE HFA 108 (90 BASE) MCG/ACT IN AERS: 2.0000 | INHALATION_SPRAY | Freq: Four times a day (QID) | RESPIRATORY_TRACT | 11 refills | Status: DC | PRN

## 2015-10-16 MED ORDER — BUDESONIDE-FORMOTEROL FUMARATE 80-4.5 MCG/ACT IN AERO: INHALATION_SPRAY | RESPIRATORY_TRACT | 12 refills | Status: DC

## 2015-10-16 MED ORDER — AZITHROMYCIN 250 MG PO TABS: ORAL_TABLET | ORAL | 11 refills | Status: DC

## 2015-10-16 MED ORDER — CEFDINIR 300 MG PO CAPS: 300.0000 mg | ORAL_CAPSULE | Freq: Two times a day (BID) | ORAL | 11 refills | Status: DC

## 2015-10-16 NOTE — Telephone Encounter (Signed)
Spoke with pt. She is aware of this information. Nothing further was needed. 

## 2015-10-16 NOTE — Telephone Encounter (Signed)
LMTCB

## 2015-10-16 NOTE — Assessment & Plan Note (Signed)
   -   PFT's 10/14/2010  FEV1  1.25 (68%) and ratio 58% and DLCO 90%     - PFT's 10/29/2011  FEV1  1.33 (74%) and ratio 57 % and DLCO 93%    - PFTs 08/14/2013   FEV1  1.16 (60%) and ratio 61 with dlco 83%     - Flutter valve added 09/03/14      - alpha one   01/14/2015 >  MM, level 147     - IgE  01/14/15  11     - 05/15/2015  After extensive coaching HFA effectiveness =    90%    - 10/16/2015 changed to symbicort 80 2bid (? Higher doses contributing to w MAI /freq of infections)  Also add zmax back for daily rx and use omnicef prn flares   I had an extended discussion with the patient reviewing all relevant studies completed to date and  lasting 15 to 20 minutes of a 25 minute visit    Each maintenance medication was reviewed in detail including most importantly the difference between maintenance and prns and under what circumstances the prns are to be triggered using an action plan format that is not reflected in the computer generated alphabetically organized AVS.    Please see instructions for details which were reviewed in writing and the patient given a copy highlighting the part that I personally wrote and discussed at today's ov.

## 2015-10-16 NOTE — Progress Notes (Signed)
Subjective:     Patient ID: Belinda Day, female   DOB: 12-Jan-1946    MRN: [id number]   Brief patient profile:  57  yowf quit smoking 1972 with documented right middle lobe  syndrome and evidence of bronchiectasis by CT scan in March 2002   History of Present Illness  08/08/07 FOB with classic cobblestoning and MAI on culture.   08/15/07 given Levaquin x 10 days with resolution bloody mucus, but "felt she had flu the whole time" with aches, feverish   August 29, 2007 ov: first post bronch co still coughing up mucus clear and initiate rx with symbicort/ [place]   October 19, 2007 ov no cough , sob, feeling great but no improvement on cxr   December 07, 2007 ov feeling great, minimal am cough not productive. No sob.   Opth eval, labs ok 10/2007   September 06, 2008 ov overall better over the last year, less tendency to exac on zmax and ethambutol. rec complete another year > satisfied improved 90% and stopped zmax and eth 08/2009   05/31/2014 f/u ov/Belinda Day re: bronchiectasis/chronic airflow obst/  requesting to restart maint zmax/eth Chief Complaint  Patient presents with  . Follow-up    cough still there; SOB at times   Cough worse esp in am > yellow mucus x sev tsp x months  Rare saba need on symbicort 160 2bid rec Zithromax 250 mg daily and ethambutol is 400mg  twice x 3 months  See your eye doctor as soon as possible to let him know you have started ethambutol and stop it if having any vision problems     09/03/2014 f/u ov/Belinda Day re: bronchiectasis/ airflow obst/ no better cough on zmax/etham  Chief Complaint  Patient presents with  . Follow-up    Pt states that her cough comes and goes and is occ prod with light yellow sputum.    Breathing is worse in heat / takes one hour to get mucus up each am  rec Only use your albuterol as a rescue medication  Stay on symbicort Add Try prilosec otc 20mg   Take 30-60 min before first meal of the day and Pepcid ac (famotidine)  20 mg one @  bedtime until cough is completely gone for at least a week without the need for cough suppression Add flutter valve today to you am routine   In event of a flare of nasty mucus > zithromax 250 x 2 then one daily x 4 days and stop    10/15/2014  f/u ov/Belinda Day re: bronchiectasis / maint rx = symb 160 2id  Chief Complaint  Patient presents with  . Follow-up    Feeling a little better.Cough is better but worse in am-pale yellow and early afternoon-dry. Using flutter valve two times a day.Sob with exertion-same,no wheezing.Occass. has "spasms" across abdomen and back when coughing.Needs Proair and Symbicort refills.Wants flu shot  Not limited by breathing from desired activities   rec Take pepcid 20 mg after bfast and supper/or bedtime GERD (REFLUX) diet   01/14/2015  f/u ov/Belinda Day re: obst bronchiectasis/ symb 160 2bid/ rare saba/ has flutter helps some / much better p adding h2 bid   Chief Complaint  Patient presents with  . Follow-up    pt following for obstruvtive bronchitis: pt states she is finishing up a second round of azithromycin. pt sttaes she does still have a litttle prod cough yellowidh in color, and some wheezing. no c/o of SOB or chest tightness.  Not limited by breathing from desired activities  rec No change in recommendations keep the candy handy to prevent throat clearing   Admit date: 04/17/2015 Discharge date: 04/19/2015   Discharge Diagnoses:  Principal Problem:  Influenza A Active Problems:  MAI (mycobacterium avium-intracellulare) ([place])  Essential hypertension  CAD (coronary artery disease) s/p CABG 15 yrs ago  Hypothyroidism  GERD (gastroesophageal reflux disease)  Bronchiectasis chronic Hypocalcemia      05/15/2015  Post hosp f/u ov/Belinda Day re: symb  160 2bid Chief Complaint  Patient presents with  . Follow-up    Cough has been prod with clear, foamy sputum.  She has been using albuterol 2 x daily on average.   still needing more  albuterol than baseline despite good hfa technique with symbiort 160 and confirme using it 2 bid No noct resp complaints  Doe = MMRC2 = can't walk a nl pace on a flat grade s sob but does fine slow and flat eg grocery shopping  rec Plan A = Automatic =  symbicort 160 Take 2 puffs first thing in am and then another 2 puffs about 12 hours later.  Plan B = Backup Only use your albuterol (proair) as a rescue medication  For congested/rattling cough > either mucinex or mucinex dm up to 1200 mg every 12 hours as needs but use the flutter as much as possible For dry raspy cough > benzoate 100 mg up to 4 x daily    08/14/2015  f/u ov/Belinda Day re:  obst bronchiectasis on symbicort 160 2bid  Chief Complaint  Patient presents with  . Follow-up    Cough still the same,coughed up bright red bld. on 2 occassions since last ov,lasted 1 day each-felt like sorethroat at the time,sob occass.,worse with humidity,denies cp or tightness,no fcs.Saw Dr. Stanford Breed last wk. everything was good.  just two episodes hemoptysis  x one month while on asa but the total may approach 2 tbsp in 24 h rec Hold aspirin anytime you notice more blood than usual  > stopped aspirin permanently For definite change in mucus > Zpak     10/16/2015  f/u ov/Belinda Day re: obst bronchiectasis/ flare on symb 160 2bid and prn zpak Chief Complaint  Patient presents with  . Acute Visit    c/o coughing up bld.bright red 3 episodes since last ov. Took Z-pak each time. Last time finished was Sept. 14th. Not coughing up bld. now,no cough now. Off Aspirin.Shaky today.Using Albuterol 2-3 times a wk.Sob same,occass. wheezing.Denies cp or tightness, No fcs.  no   Purulent sputum just blood, chest discomfort with cough but does not lateralize Never coughed up more than a few tbsp per day  Not limited by breathing from desired activities        No obvious patterns  day to day or  daytime variabilty or assoc  purulent sputum or mucus plugs  chest tightness,   overt sinus or hb symptoms. No unusual exp hx or h/o childhood pna/ asthma or premature birth to her knowledge.   Sleeping ok without nocturnal  or early am exacerbation  of respiratory  c/o's or need for noct saba. Also denies any obvious fluctuation of symptoms with weather or environmental changes or other aggravating or alleviating factors except as outlined above      ROS  The following are not active complaints unless bolded sore throat, dysphagia, dental problems, itching, sneezing,  nasal congestion or excess/ purulent secretions, ear ache,   fever, chills, sweats, unintended wt loss, pleuritic or exertional  cp, hemoptysis,  orthopnea pnd or leg swelling, presyncope, palpitations, heartburn, abdominal pain, anorexia, nausea, vomiting, diarrhea  or change in bowel or urinary habits, change in stools or urine, dysuria,hematuria,  rash, arthralgias, visual complaints, headache, numbness weakness or ataxia or problems with walking or coordination,  change in mood/affect or memory.       Past Medical History:  Bronchiectasis see CT SE 04/13/00  - HFA 75% November 19, 2009  - alpha one screen 01/14/2015 >>>  - IgE 01/14/2015 >>>  MAI  - Rx Zmax and ETH 08/29/07 > 08/2009 restarted empirically 05/31/14 > 09/03/14 (no change in cough so just use zpak for flares)  - Rx zmax only 10/16/2015 >>>  - Eye eval   10/09.......................[place] Kitchen[place] so try cycles of cipro April 02, 2010  HEALTH MAINTENANCE...........................[name] - Td 10/2007  - Pneumovax 2005   and 10/29/2011 age 69, prevnar 08/16/2013  CAD  Hyperlipidemia  Hypertension  History of cough with ACE inhibition.  Gastroesophageal reflux disease         Objective:   Physical Exam   In general she is an extremely  pleasant ambulatory white female in no acute distress.    Vital signs reviewed  - note sats 97% on arrival    Wt 130 October 19, 2007>140 March 31, 2010 > 127 07/16/2010 > 10/14/2010  120 >  05/04/2011  117 > 07/30/2011  120 > 10/29/2011 118 > 130  05/01/2012 > 12/12/2012 132 >  08/14/13 137 >    02/20/2014  137 >  05/31/2014 134 > 09/03/2014    140 > 10/15/2014 137 > 01/14/2015 137 >  05/15/2015 123 >08/14/2015  124 >  10/16/2015 126   HEENT: nl dentition, turbinates, and orophanx. Nl external ear canals without cough reflex  Neck without JVD/Nodes/TM  Lungs   With a few insp pops bilaterally with  Exp rhonchi bilaterally much better with plb RRR no s3 or murmur or increase in P2  Abd soft and benign with nl excursion in the supine position. No bruits or organomegaly  Ext warm without calf tenderness, cyanosis clubbing or edema            CXR PA and Lateral:   08/14/2015 :    I personally reviewed images and agree with radiology impression as follows:   Stable chronic changes within both lungs consistent with previous infection. Previous CABG. No acute cardiopulmonary abnormality.       Assessment:

## 2015-10-16 NOTE — Telephone Encounter (Signed)
-----   Message from Tanda Rockers, MD sent at 10/16/2015 10:46 AM EDT ----- Error on instructions : omnicef should be for 7 days only for now

## 2015-10-16 NOTE — Patient Instructions (Addendum)
Change zithromax 250 mg daily and reduce the symbicort to 80 Take 2 puffs first thing in am and then another 2 puffs about 12 hours later.   When cough flares, cough into the flutter valve and use the mucinex up to 1200 mg every 12 hours as needed   If mucus turns nasty > omnicef 300 mg twice daily x 10 days   Please schedule a follow up office visit in 6 weeks (change the appointment)  lated add:  omnicef should be 7 days for now > called to correct

## 2015-10-29 ENCOUNTER — Other Ambulatory Visit: Payer: Self-pay | Admitting: Family Medicine

## 2015-11-17 ENCOUNTER — Ambulatory Visit: Payer: Medicare Other | Admitting: Internal Medicine

## 2015-11-27 ENCOUNTER — Ambulatory Visit (INDEPENDENT_AMBULATORY_CARE_PROVIDER_SITE_OTHER): Payer: Medicare Other | Admitting: Internal Medicine

## 2015-11-27 ENCOUNTER — Encounter: Payer: Self-pay | Admitting: Internal Medicine

## 2015-11-27 ENCOUNTER — Ambulatory Visit (INDEPENDENT_AMBULATORY_CARE_PROVIDER_SITE_OTHER)
Admission: RE | Admit: 2015-11-27 | Discharge: 2015-11-27 | Disposition: A | Discharge status: Home / Self Care | Payer: Medicare Other | Source: Ambulatory Visit | Attending: Internal Medicine | Admitting: Internal Medicine

## 2015-11-27 VITALS — BP 124/74 | HR 75 | Ht 61.0 in | Wt 121.2 lb

## 2015-11-27 DIAGNOSIS — R0602 Shortness of breath: Secondary | ICD-10-CM | POA: Diagnosis not present

## 2015-11-27 DIAGNOSIS — A310 Pulmonary mycobacterial infection: Secondary | ICD-10-CM

## 2015-11-27 DIAGNOSIS — J471 Bronchiectasis with (acute) exacerbation: Secondary | ICD-10-CM | POA: Diagnosis not present

## 2015-11-27 DIAGNOSIS — J479 Bronchiectasis, uncomplicated: Secondary | ICD-10-CM

## 2015-11-27 DIAGNOSIS — R05 Cough: Secondary | ICD-10-CM | POA: Diagnosis not present

## 2015-11-27 NOTE — Assessment & Plan Note (Addendum)
-   PFT's 10/14/2010  FEV1  1.25 (68%) and ratio 58% and DLCO 90%     - PFT's 10/29/2011  FEV1  1.33 (74%) and ratio 57 % and DLCO 93%    - PFTs 08/14/2013   FEV1  1.16 (60%) and ratio 61 with dlco 83%     - Flutter valve added 09/03/14      - alpha one   01/14/2015 >  MM, level 147     - IgE  01/14/15  11     - 05/15/2015  After extensive coaching HFA effectiveness =    90%  - 10/16/2015 changed to symbicort 80 2bid (? Higher doses contributing to w MAI /freq of infections)    Adequate control on present rx, reviewed > no change in rx needed

## 2015-11-27 NOTE — Progress Notes (Signed)
Subjective:     Patient ID: Belinda Day, female   DOB: 12-Jan-1946    MRN: [id number]   Brief patient profile:  70  yowf quit smoking 1972 with documented right middle lobe  syndrome and evidence of bronchiectasis by CT scan in March 2002   History of Present Illness  08/08/07 FOB with classic cobblestoning and MAI on culture.   08/15/07 given Levaquin x 10 days with resolution bloody mucus, but "felt she had flu the whole time" with aches, feverish   August 29, 2007 ov: first post bronch co still coughing up mucus clear and initiate rx with symbicort/ [place]   October 19, 2007 ov no cough , sob, feeling great but no improvement on cxr   December 07, 2007 ov feeling great, minimal am cough not productive. No sob.   Opth eval, labs ok 10/2007   September 06, 2008 ov overall better over the last year, less tendency to exac on zmax and ethambutol. rec complete another year > satisfied improved 90% and stopped zmax and eth 08/2009   05/31/2014 f/u ov/Belinda Day re: bronchiectasis/chronic airflow obst/  requesting to restart maint zmax/eth Chief Complaint  Patient presents with  . Follow-up    cough still there; SOB at times   Cough worse esp in am > yellow mucus x sev tsp x months  Rare saba need on symbicort 160 2bid rec Zithromax 250 mg daily and ethambutol is 400mg  twice x 3 months  See your eye doctor as soon as possible to let him know you have started ethambutol and stop it if having any vision problems     09/03/2014 f/u ov/Belinda Day re: bronchiectasis/ airflow obst/ no better cough on zmax/etham  Chief Complaint  Patient presents with  . Follow-up    Pt states that her cough comes and goes and is occ prod with light yellow sputum.    Breathing is worse in heat / takes one hour to get mucus up each am  rec Only use your albuterol as a rescue medication  Stay on symbicort Add Try prilosec otc 20mg   Take 30-60 min before first meal of the day and Pepcid ac (famotidine)  20 mg one @  bedtime until cough is completely gone for at least a week without the need for cough suppression Add flutter valve today to you am routine   In event of a flare of nasty mucus > zithromax 250 x 2 then one daily x 4 days and stop    10/15/2014  f/u ov/Belinda Day re: bronchiectasis / maint rx = symb 160 2id  Chief Complaint  Patient presents with  . Follow-up    Feeling a little better.Cough is better but worse in am-pale yellow and early afternoon-dry. Using flutter valve two times a day.Sob with exertion-same,no wheezing.Occass. has "spasms" across abdomen and back when coughing.Needs Proair and Symbicort refills.Wants flu shot  Not limited by breathing from desired activities   rec Take pepcid 20 mg after bfast and supper/or bedtime GERD (REFLUX) diet   01/14/2015  f/u ov/Belinda Day re: obst bronchiectasis/ symb 160 2bid/ rare saba/ has flutter helps some / much better p adding h2 bid   Chief Complaint  Patient presents with  . Follow-up    pt following for obstruvtive bronchitis: pt states she is finishing up a second round of azithromycin. pt sttaes she does still have a litttle prod cough yellowidh in color, and some wheezing. no c/o of SOB or chest tightness.  Not limited by breathing from desired activities  rec No change in recommendations keep the candy handy to prevent throat clearing   Admit date: 04/17/2015 Discharge date: 04/19/2015   Discharge Diagnoses:  Principal Problem:  Influenza A Active Problems:  MAI (mycobacterium avium-intracellulare) ([place])  Essential hypertension  CAD (coronary artery disease) s/p CABG 15 yrs ago  Hypothyroidism  GERD (gastroesophageal reflux disease)  Bronchiectasis chronic Hypocalcemia      05/15/2015  Post hosp f/u ov/Belinda Day re: symb  160 2bid Chief Complaint  Patient presents with  . Follow-up    Cough has been prod with clear, foamy sputum.  She has been using albuterol 2 x daily on average.   still needing more  albuterol than baseline despite good hfa technique with symbiort 160 and confirme using it 2 bid No noct resp complaints  Doe = MMRC2 = can't walk a nl pace on a flat grade s sob but does fine slow and flat eg grocery shopping  rec Plan A = Automatic =  symbicort 160 Take 2 puffs first thing in am and then another 2 puffs about 12 hours later.  Plan B = Backup Only use your albuterol (proair) as a rescue medication  For congested/rattling cough > either mucinex or mucinex dm up to 1200 mg every 12 hours as needs but use the flutter as much as possible For dry raspy cough > benzoate 100 mg up to 4 x daily    08/14/2015  f/u ov/Belinda Day re:  obst bronchiectasis on symbicort 160 2bid  Chief Complaint  Patient presents with  . Follow-up    Cough still the same,coughed up bright red bld. on 2 occassions since last ov,lasted 1 day each-felt like sorethroat at the time,sob occass.,worse with humidity,denies cp or tightness,no fcs.Saw Dr. Stanford Breed last wk. everything was good.  just two episodes hemoptysis  x one month while on asa but the total may approach 2 tbsp in 24 h rec Hold aspirin anytime you notice more blood than usual  > stopped aspirin permanently For definite change in mucus > Zpak     10/16/2015  f/u ov/Belinda Day re: obst bronchiectasis/ flare on symb 160 2bid and prn zpak Chief Complaint  Patient presents with  . Acute Visit    c/o coughing up bld.bright red 3 episodes since last ov. Took Z-pak each time. Last time finished was Sept. 14th. Not coughing up bld. now,no cough now. Off Aspirin.Shaky today.Using Albuterol 2-3 times a wk.Sob same,occass. wheezing.Denies cp or tightness, No fcs.  no   Purulent sputum just blood, chest discomfort with cough but does not lateralize Never coughed up more than a few tbsp per day Not limited by breathing from desired activities   rec Change zithromax 250 mg daily and reduce the symbicort to 80 Take 2 puffs first thing in am and then another 2 puffs  about 12 hours later.  When cough flares, cough into the flutter valve and use the mucinex up to 1200 mg every 12 hours as needed  If mucus turns nasty > omnicef 300 mg twice daily x 10 days  Please schedule a follow up office visit in 6 weeks (change the appointment)  lated add:  omnicef should be 7 days for now > called to correct    11/27/2015  f/u ov/Koury Roddy re: obstructive bronchiectasis / flare since 10/28 started omnicef 11/25/15  No saba  Chief Complaint  Patient presents with  . Follow-up    Started coughing up blood again 4 days ago-  started on omnicef.   never more than a few tbsp per day / no longer on asa   Not limited by breathing from desired activities       No obvious patterns  day to day or  daytime variabilty or assoc  purulent sputum or mucus plugs  chest tightness,  overt sinus or hb symptoms. No unusual exp hx or h/o childhood pna/ asthma or premature birth to her knowledge.   Sleeping ok without nocturnal  or early am exacerbation  of respiratory  c/o's or need for noct saba. Also denies any obvious fluctuation of symptoms with weather or environmental changes or other aggravating or alleviating factors except as outlined above      ROS  The following are not active complaints unless bolded sore throat, dysphagia, dental problems, itching, sneezing,  nasal congestion or excess/ purulent secretions, ear ache,   fever, chills, sweats, unintended wt loss, pleuritic or exertional cp, hemoptysis,  orthopnea pnd or leg swelling, presyncope, palpitations, heartburn, abdominal pain, anorexia, nausea, vomiting, diarrhea  or change in bowel or urinary habits, change in stools or urine, dysuria,hematuria,  rash, arthralgias, visual complaints, headache, numbness weakness or ataxia or problems with walking or coordination,  change in mood/affect or memory.       Past Medical History:  Bronchiectasis see CT SE 04/13/00  - HFA 75% November 19, 2009  - alpha one screen 01/14/2015  >>>  - IgE 01/14/2015 >>>  MAI  - Rx Zmax and ETH 08/29/07 > 08/2009 restarted empirically 05/31/14 > 09/03/14 (no change in cough so just use zpak for flares)  - Rx zmax only 10/16/2015 >>>  - Eye eval   10[place] try cycles of cipro April 02, 2010  HEALTH MAINTENANCE...........................[name] - Td 10/2007  - Pneumovax 2005   and 10/29/2011 age 67, prevnar 08/16/2013  CAD  Hyperlipidemia  Hypertension  History of cough with ACE inhibition.  Gastroesophageal reflux disease         Objective:   Physical Exam   In general she is an extremely  pleasant ambulatory white female in no acute distress.    Vital signs reviewed  - note sats 96% on arrival    [facility] 130 October 19, 2007>140 March 31, 2010 > 127 07/16/2010 > 10/14/2010  120 > 05/04/2011  117 > 07/30/2011  120 > 10/29/2011 118 > 130  05/01/2012 > 12/12/2012 132 >  08/14/13 137 >    02/20/2014  137 >  05/31/2014 134 > 09/03/2014    140 > 10/15/2014 137 > 01/14/2015 137 >  05/15/2015 123 >08/14/2015  124 >  10/16/2015 126   HEENT: nl dentition, turbinates, and orophanx. Nl external ear canals without cough reflex  Neck without JVD/Nodes/TM  Lungs   With a few insp pops bilaterally with prominent   Exp rhonchi bilaterally  RRR no s3 or murmur or increase in P2  Abd soft and benign with nl excursion in the supine position. No bruits or organomegaly  Ext warm without calf tenderness, cyanosis clubbing or edema        CXR PA and Lateral:   11/27/2015 :    I personally reviewed images and agree with radiology impression as follows:    Stable chronic bronchitic changes. Parenchymal scarring is also stable bilaterally. No CHF, pneumonia, nor other acute cardiopulmonary abnormality       Assessment:

## 2015-11-27 NOTE — Patient Instructions (Addendum)
Please remember to go to the   x-ray department downstairs for your tests - we will call you with the results when they are available.  No change in medications   Please schedule a follow up visit in 3 months but call sooner if needed

## 2015-11-27 NOTE — Assessment & Plan Note (Signed)
-  08/08/07 FOB with classic cobblestoning and MAI on culture.  - Rx 08/2007   To 08/2009 with ETH/Zmax - restarted empirically 05/31/14 > stopped 09/03/14 no benefit perceived, no change on cxr    This is an extremely common benign condition in the elderly and does not warrant aggressive eval/ rx at this point unless there is a clinical correlation suggesting unaddressed pulmonary infection (purulent sputum, night sweats, unintended wt loss, doe) or evolution of  obvious changes on plain cxr (as opposed to serial CT, which is way over sensitive to make clinical decisions re intervention and treatment in the elderly, who tend to tolerate both dx and treatment poorly) .   She is doing fine off all MAI rx except for occ hemotpysis for which  Appears to respond best to [facility] which clearly does not suggest MAI is the cause   I had an extended discussion with the patient reviewing all relevant studies completed to date and  lasting 15 to 20 minutes of a 25 minute visit    Each maintenance medication was reviewed in detail including most importantly the difference between maintenance and prns and under what circumstances the prns are to be triggered using an action plan format that is not reflected in the computer generated alphabetically organized AVS.    Please see instructions for details which were reviewed in writing and the patient given a copy highlighting the part that I personally wrote and discussed at today's ov.

## 2015-11-28 NOTE — Progress Notes (Signed)
lmtcb

## 2015-11-28 NOTE — Progress Notes (Signed)
Spoke with pt and notified of results per Dr. Wert. Pt verbalized understanding and denied any questions. 

## 2015-12-26 DIAGNOSIS — Z1231 Encounter for screening mammogram for malignant neoplasm of breast: Secondary | ICD-10-CM | POA: Diagnosis not present

## 2015-12-26 LAB — HM MAMMOGRAPHY

## 2015-12-31 ENCOUNTER — Encounter: Payer: Self-pay | Admitting: Family Medicine

## 2015-12-31 DIAGNOSIS — R922 Inconclusive mammogram: Secondary | ICD-10-CM | POA: Diagnosis not present

## 2015-12-31 LAB — HM MAMMOGRAPHY

## 2016-01-01 ENCOUNTER — Encounter: Payer: Self-pay | Admitting: Family Medicine

## 2016-01-15 ENCOUNTER — Other Ambulatory Visit: Payer: Self-pay

## 2016-01-15 MED ORDER — ATORVASTATIN CALCIUM 80 MG PO TABS: 80.0000 mg | ORAL_TABLET | Freq: Every day | ORAL | 0 refills | Status: DC

## 2016-01-27 ENCOUNTER — Other Ambulatory Visit: Payer: Self-pay | Admitting: Family Medicine

## 2016-02-09 ENCOUNTER — Other Ambulatory Visit (INDEPENDENT_AMBULATORY_CARE_PROVIDER_SITE_OTHER): Payer: Medicare Other

## 2016-02-09 DIAGNOSIS — E785 Hyperlipidemia, unspecified: Secondary | ICD-10-CM

## 2016-02-09 DIAGNOSIS — I10 Essential (primary) hypertension: Secondary | ICD-10-CM

## 2016-02-09 DIAGNOSIS — R739 Hyperglycemia, unspecified: Secondary | ICD-10-CM | POA: Diagnosis not present

## 2016-02-09 DIAGNOSIS — E039 Hypothyroidism, unspecified: Secondary | ICD-10-CM

## 2016-02-09 LAB — COMPREHENSIVE METABOLIC PANEL
ALBUMIN: 4 g/dL (ref 3.5–5.2)
ALT: 27 U/L (ref 0–35)
AST: 29 U/L (ref 0–37)
Alkaline Phosphatase: 90 U/L (ref 39–117)
BUN: 22 mg/dL (ref 6–23)
CHLORIDE: 102 meq/L (ref 96–112)
CO2: 26 meq/L (ref 19–32)
Calcium: 8.8 mg/dL (ref 8.4–10.5)
Creatinine, Ser: 0.85 mg/dL (ref 0.40–1.20)
GFR: 69.7 mL/min (ref >60.00)
GLUCOSE: 94 mg/dL (ref 70–99)
POTASSIUM: 3.7 meq/L (ref 3.5–5.1)
SODIUM: 136 meq/L (ref 135–145)
Total Bilirubin: 0.4 mg/dL (ref 0.2–1.2)
Total Protein: 6.3 g/dL (ref 6.0–8.3)

## 2016-02-09 LAB — CBC
HEMATOCRIT: 40.5 % (ref 36.0–46.0)
Hemoglobin: 13.9 g/dL (ref 12.0–15.0)
MCHC: 34.4 g/dL (ref 30.0–36.0)
MCV: 92.9 fl (ref 78.0–100.0)
Platelets: 259 10*3/uL (ref 150.0–400.0)
RBC: 4.35 Mil/uL (ref 3.87–5.11)
RDW: 13.3 % (ref 11.5–15.5)
WBC: 6.1 10*3/uL (ref 4.0–10.5)

## 2016-02-09 LAB — LIPID PANEL
CHOL/HDL RATIO: 3
CHOLESTEROL: 164 mg/dL (ref 0–200)
HDL: 65.2 mg/dL (ref >39.00)
LDL CALC: 82 mg/dL (ref 0–99)
NonHDL: 99.16
Triglycerides: 88 mg/dL (ref 0.0–149.0)
VLDL: 17.6 mg/dL (ref 0.0–40.0)

## 2016-02-09 LAB — TSH: TSH: 0.69 u[IU]/mL (ref 0.35–4.50)

## 2016-02-09 LAB — HEMOGLOBIN A1C: Hgb A1c MFr Bld: 6.1 % (ref 4.6–6.5)

## 2016-02-16 ENCOUNTER — Encounter: Payer: Self-pay | Admitting: Family Medicine

## 2016-02-16 ENCOUNTER — Ambulatory Visit (INDEPENDENT_AMBULATORY_CARE_PROVIDER_SITE_OTHER): Payer: Medicare Other | Admitting: Family Medicine

## 2016-02-16 VITALS — BP 128/74 | HR 71 | Temp 98.3°F | Ht 61.0 in | Wt 128.0 lb

## 2016-02-16 DIAGNOSIS — Z0000 Encounter for general adult medical examination without abnormal findings: Secondary | ICD-10-CM | POA: Diagnosis not present

## 2016-02-16 MED ORDER — OSELTAMIVIR PHOSPHATE 75 MG PO CAPS: 75.0000 mg | ORAL_CAPSULE | Freq: Every day | ORAL | 0 refills | Status: DC

## 2016-02-16 NOTE — Progress Notes (Signed)
Pre visit review using our clinic review tool, if applicable. No additional management support is needed unless otherwise documented below in the visit note. 

## 2016-02-16 NOTE — Progress Notes (Signed)
Phone: [phone]  Subjective:  Patient presents today for their annual physical. Chief complaint-noted.   See problem oriented charting- ROS- full  review of systems was completed and negative except for: mild chronic cough- mucinex helps some. Usually clear. No chest pain. Shortness of breath at times with known lung disease.   The following were reviewed and entered/updated in epic: Past Medical History:  Diagnosis Date  . Bronchiectasis    oxygen at night in the past  . CAD (coronary artery disease)   . Diverticulitis 2014  . GERD (gastroesophageal reflux disease)   . History of shingles 04/2012  . HTN (hypertension)   . Hyperlipidemia   . Hypothyroidism   . MAI (mycobacterium avium-intracellulare) ([place])   . Neuritis of upper extremity    Patient Active Problem List   Diagnosis Date Noted  . CAD (coronary artery disease) s/p CABG 07/03/2007    Priority: High  . Hyperglycemia 08/05/2014    Priority: Medium  . Insomnia 02/12/2013    Priority: Medium  . Nocturnal hypoxemia 12/12/2012    Priority: Medium  . Fibromyalgia 12/28/2011    Priority: Medium  . Hypothyroidism 04/06/2010    Priority: Medium  . MAI (mycobacterium avium-intracellulare) ([place]) 11/19/2009    Priority: Medium  . Hyperlipemia 07/03/2007    Priority: Medium  . Essential hypertension 07/03/2007    Priority: Medium  . Obstructive bronchiectasis ([place]) 07/03/2007    Priority: Medium  . Former smoker 08/05/2014    Priority: Low  . Constipation 05/02/2014    Priority: Low  . History of colonic polyps 05/02/2014    Priority: Low  . GERD (gastroesophageal reflux disease) 02/24/2014    Priority: Low  . Hemoptysis 02/24/2014    Priority: Low  . Benign paroxysmal positional vertigo 04/18/2013    Priority: Low  . Diverticulitis 12/21/2012    Priority: Low  . Osteopenia 08/14/2011    Priority: Low  . Cerebrovascular disease 01/31/2015   Past Surgical History:  Procedure Laterality Date  .  CORONARY ARTERY BYPASS GRAFT  2004   x3 CABG    Family History  Problem Relation Age of Onset  . Colon cancer Mother   . Hyperlipidemia Mother   . Hypertension Mother   . Heart disease Mother   . Stroke Mother   . Diabetes Mother   . Colon cancer Father   . Arthritis Father   . Hyperlipidemia Father   . Hypertension Father   . Heart disease Father   . Stroke Father   . Atopy Neg Hx     Medications- reviewed and updated Current Outpatient Prescriptions  Medication Sig Dispense Refill  . acetaminophen (TYLENOL) 650 MG CR tablet every 8 (eight) hours as needed for pain.     Marland Kitchen albuterol (PROAIR HFA) 108 (90 Base) MCG/ACT inhaler Inhale 2 puffs into the lungs every 6 (six) hours as needed for wheezing or shortness of breath. 1 Inhaler 11  . ALPRAZolam (XANAX) 0.25 MG tablet take 1 tablet by mouth at bedtime if needed for sleep 60 tablet 4  . amitriptyline (ELAVIL) 50 MG tablet take 1 tablet by mouth at bedtime if needed 90 tablet 1  . aspirin EC 81 MG tablet Take 81 mg by mouth every other day.    Marland Kitchen atorvastatin (LIPITOR) 80 MG tablet Take 1 tablet (80 mg total) by mouth daily. 90 tablet 0  . budesonide-formoterol (SYMBICORT) 80-4.5 MCG/ACT inhaler Take 2 puffs first thing in am and then another 2 puffs about 12 hours later. 1 Inhaler  12  . cefdinir (OMNICEF) 300 MG capsule Take 1 capsule (300 mg total) by mouth 2 (two) times daily. 14 capsule 11  . guaiFENesin (MUCINEX) 600 MG 12 hr tablet Take 600 mg by mouth 2 (two) times daily.    . metFORMIN (GLUCOPHAGE) 500 MG tablet Take 250 mg by mouth daily with breakfast.    . nebivolol (BYSTOLIC) 2.5 MG tablet Take 1 tablet (2.5 mg total) by mouth daily. 90 tablet 3  . Respiratory Therapy Supplies (FLUTTER) DEVI Use as directed 1 each 0  . SYNTHROID 75 MCG tablet take 1 tablet by mouth 6 DAYS A WEEK THEN TAKE 1 AND 1/2  TABLETS ON THE 7TH DAY 95 tablet 3  . valsartan (DIOVAN) 80 MG tablet Take 1 tablet (80 mg total) by mouth daily. 90  tablet 3  . oseltamivir (TAMIFLU) 75 MG capsule Take 1 capsule (75 mg total) by mouth daily. For prophylaxis 10 capsule 0   No current facility-administered medications for this visit.     Allergies-reviewed and updated Allergies  Allergen Reactions  . Bactrim [Sulfamethoxazole-Trimethoprim] Hives, Itching and Other (See Comments)    Bruised like areas on body  . Ciprofloxacin Other (See Comments)    Body aches  . Codeine Nausea Only    REACTION: nausea  . Erythromycin Nausea Only    REACTION: nausea  . Levofloxacin Other (See Comments)    REACTION: aches    Social History   Social History  . Marital status: Single    Spouse name: N/A  . Number of children: N/A  . Years of education: N/A   Social History Main Topics  . Smoking status: Former Smoker    Packs/day: 1.00    Years: 20.00    Types: Cigarettes    Quit date: 01/25/1970  . Smokeless tobacco: Never Used  . Alcohol use No  . Drug use: No  . Sexual activity: Not on file   Other Topics Concern  . Not on file   Social History Narrative   Family: Single never married, no children, cat and rehabs turtles and tortoise   Went to [employer] in [place] and social work, some business courses at [employer], EMT for 6 years.    LIves alone. Completely independent.    Lives in retirement community.       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 Oropharynx normal Neck: no thyromegaly CV: RRR no murmurs rubs or gallops Lungs: CTAB no crackles, wheeze, rhonchi Abdomen: soft/nontender/nondistended/normal bowel sounds. No rebound or guarding.  Ext: no edema Skin: warm, dry Neuro: grossly normal, moves all  extremities, PERRLA  Assessment/Plan:  71 y.o. female presenting for annual physical.  Health Maintenance counseling: 1. Anticipatory guidance: Patient counseled regarding regular dental exams, eye exams, wearing seatbelts.  2. Risk factor reduction:  Advised patient of need for regular exercise and diet rich and fruits and vegetables to reduce risk of heart attack and stroke. Exercise was down in December- weight up some from over this year. Eating habits have been worse in December- expects to turn thinkgs around 3. Immunizations/screenings/ancillary studies Immunization History  Administered Date(s)  Administered  . H1N1 01/02/2008  . Influenza Split 10/14/2010, 10/07/2011  . Influenza,inj,Quad PF,36+ Mos 10/02/2012, 10/16/2015  . Influenza-Unspecified 09/25/2013, 10/15/2014  . Pneumococcal Conjugate-13 08/14/2013  . Pneumococcal Polysaccharide-23 10/29/2011  4. Cervical cancer screening- Teryl Lucy sees yearly-  5. Breast cancer screening-  breast exams with GYN and mammogram 12/31/15 6. Colon cancer screening - 01/2013 with 5 year follow up including with family history 7. Skin cancer screening- Dr. Amy Martinique follows yearly  Status of chronic or acute concerns  CAD- follows with Dr. Stanford Breed. On aspirin, atorvastatin. CABG x3 2004 HLD- lipids close to goal on atorvastatin 80mg . Prefer LDL <70 - weight is up so she will work on this.   Hyperglycemia- remains at risk for diabetes with a1c Lab Results  Component Value Date   HGBA1C 6.1 02/09/2016  on metformin 250mg    obstructive bronchiectasis and MAI. Dr. Melvyn Novas q3 months. Om Symbicort, antibiotic prno and albuterol prn. On nighttime o2 in past.   Insomnia- on xanax 0.25mg  for sleep- pulmonary has not discouraged  Fibromyalgia- on amitriptyline 50mg , somewhat helpful. Rare tylenol.   Hypothyroidism- controlled on synthroid 75 mcg  HTN- doing well on bystolic and valsartan (basically half prior doses)  Going to be caring  for a family friend that she is the health executor- who has influenza  Return in about 6 months (around 08/15/2016) for annual wellness visit with Manuela Schwartz then see me that day or a few days after.  Meds ordered this encounter  Medications  . aspirin EC 81 MG tablet    Sig: Take 81 mg by mouth every other day.  . oseltamivir (TAMIFLU) 75 MG capsule    Sig: Take 1 capsule (75 mg total) by mouth daily. For prophylaxis    Dispense:  10 capsule    Refill:  0   Return precautions advised.   Garret Reddish, MD

## 2016-02-16 NOTE — Patient Instructions (Signed)
Overall things look great. Cholesterol a hair high- getting exercise back up and improving diet should help  Take tamiflu on day you travel to see your friend until you return home

## 2016-02-19 NOTE — Progress Notes (Signed)
HPI: FU CAD; history of coronary artery disease status post coronary artery bypass graft in 2002. Carotid Dopplers December 2015 showed no significant obstruction. Echocardiogram March 2017 showed normal LV function and trace aortic insufficiency. CTA March 2017 showed no pulmonary embolus. There was bronchiectasis noted. Nuclear study April 2017 showed ejection fraction 64% and no ischemia or infarction. Since last seen she notes some dyspnea on exertion but no orthopnea, PND or pedal edema. She has significant coughing with her bronchiectasis. With the coughing she has sharp stabbing chest pain in a cramping feeling in her chest. She otherwise has no exertional chest pain.  Current Outpatient Prescriptions  Medication Sig Dispense Refill  . acetaminophen (TYLENOL) 650 MG CR tablet every 8 (eight) hours as needed for pain.     Marland Kitchen albuterol (PROAIR HFA) 108 (90 Base) MCG/ACT inhaler Inhale 2 puffs into the lungs every 6 (six) hours as needed for wheezing or shortness of breath. 1 Inhaler 11  . ALPRAZolam (XANAX) 0.25 MG tablet take 1 tablet by mouth at bedtime if needed for sleep (Patient taking differently: take 1 tablet by mouth at bedtime) 60 tablet 4  . amitriptyline (ELAVIL) 50 MG tablet take 1 tablet by mouth at bedtime if needed (Patient taking differently: I TABLET BY MOUTH AT BEDTIME) 90 tablet 1  . aspirin EC 81 MG tablet Take 81 mg by mouth every other day.    Marland Kitchen atorvastatin (LIPITOR) 80 MG tablet Take 1 tablet (80 mg total) by mouth daily. 90 tablet 0  . budesonide-formoterol (SYMBICORT) 80-4.5 MCG/ACT inhaler Take 2 puffs first thing in am and then another 2 puffs about 12 hours later. 1 Inhaler 12  . cefdinir (OMNICEF) 300 MG capsule Take 1 capsule (300 mg total) by mouth 2 (two) times daily. 14 capsule 11  . guaiFENesin (MUCINEX) 600 MG 12 hr tablet Take 600 mg by mouth 2 (two) times daily.    . metFORMIN (GLUCOPHAGE) 500 MG tablet Take 250 mg by mouth daily with breakfast.    .  nebivolol (BYSTOLIC) 2.5 MG tablet Take 1 tablet (2.5 mg total) by mouth daily. 90 tablet 3  . Respiratory Therapy Supplies (FLUTTER) DEVI Use as directed 1 each 0  . SYNTHROID 75 MCG tablet take 1 tablet by mouth 6 DAYS A WEEK THEN TAKE 1 AND 1/2  TABLETS ON THE 7TH DAY 95 tablet 3  . valsartan (DIOVAN) 80 MG tablet Take 1 tablet (80 mg total) by mouth daily. 90 tablet 3   No current facility-administered medications for this visit.      Past Medical History:  Diagnosis Date  . Bronchiectasis    oxygen at night in the past  . CAD (coronary artery disease)   . Diverticulitis 2014  . GERD (gastroesophageal reflux disease)   . History of shingles 04/2012  . HTN (hypertension)   . Hyperlipidemia   . Hypothyroidism   . MAI (mycobacterium avium-intracellulare) ([place])   . Neuritis of upper extremity     Past Surgical History:  Procedure Laterality Date  . CORONARY ARTERY BYPASS GRAFT  2004   x3 CABG    Social History   Social History  . Marital status: Single    Spouse name: N/A  . Number of children: N/A  . Years of education: N/A   Occupational History  . Not on file.   Social History Main Topics  . Smoking status: Former Smoker    Packs/day: 1.00    Years: 20.00  Types: Cigarettes    Quit date: 01/25/1970  . Smokeless tobacco: Never Used  . Alcohol use No  . Drug use: No  . Sexual activity: Not on file   Other Topics Concern  . Not on file   Social History Narrative   Family: Single never married, no children, cat and rehabs turtles and tortoise   Went to [employer] in [place] and social work, some business courses at [employer], EMT for 6 years.    LIves alone. Completely independent.    Lives in retirement community.       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 Colon cancer Mother   . Hyperlipidemia  Mother   . Hypertension Mother   . Heart disease Mother   . Stroke Mother   . Diabetes Mother   . Colon cancer Father   . Arthritis Father   . Hyperlipidemia Father   . Hypertension Father   . Heart disease Father   . Stroke Father   . Atopy Neg Hx     ROS: Chronic cough but no fevers or chills, hemoptysis, dysphasia, odynophagia, melena, hematochezia, dysuria, hematuria, rash, seizure activity, orthopnea, PND, pedal edema, claudication. Remaining systems are negative.  Physical Exam: Well-developed well-nourished in no acute distress.  Skin is warm and dry.  HEENT is normal.  Neck is supple.  Chest with mild exp wheeze Cardiovascular exam is regular rate and rhythm.  Abdominal exam nontender or distended. No masses palpated. Extremities show no edema. neuro grossly intact  ECG-sinus rhythm at a rate of 80. Nonspecific ST changes.  A/P  1 coronary artery disease-continue aspirin and statin.   2 hyperlipidemia-continue statin.  3 hypertension-blood pressure controlled. Continue present medications.  4 carotid artery disease-continue aspirin and statin.  5 chest pain-patient has occasional pain with coughing associated with brief palpitations. She does not have exertional chest pain. Previous nuclear study negative. I will not pursue further ischemia evaluation. We can consider an event monitor in the future if she has worsening palpitations.  Kirk Ruths, MD

## 2016-02-27 ENCOUNTER — Encounter: Payer: Self-pay | Admitting: Cardiology

## 2016-02-27 ENCOUNTER — Ambulatory Visit (INDEPENDENT_AMBULATORY_CARE_PROVIDER_SITE_OTHER): Payer: Medicare Other | Admitting: Cardiology

## 2016-02-27 VITALS — BP 124/72 | HR 80 | Ht 61.0 in | Wt 124.4 lb

## 2016-02-27 DIAGNOSIS — E7800 Pure hypercholesterolemia, unspecified: Secondary | ICD-10-CM | POA: Diagnosis not present

## 2016-02-27 DIAGNOSIS — I252 Old myocardial infarction: Secondary | ICD-10-CM

## 2016-02-27 DIAGNOSIS — I2510 Atherosclerotic heart disease of native coronary artery without angina pectoris: Secondary | ICD-10-CM | POA: Diagnosis not present

## 2016-02-27 DIAGNOSIS — I2583 Coronary atherosclerosis due to lipid rich plaque: Secondary | ICD-10-CM

## 2016-02-27 DIAGNOSIS — I10 Essential (primary) hypertension: Secondary | ICD-10-CM | POA: Diagnosis not present

## 2016-02-27 NOTE — Patient Instructions (Signed)
Your physician wants you to follow-up in: 6 MONTHS WITH DR CRENSHAW You will receive a reminder letter in the mail two months in advance. If you don't receive a letter, please call our office to schedule the follow-up appointment.   If you need a refill on your cardiac medications before your next appointment, please call your pharmacy.  

## 2016-03-02 ENCOUNTER — Ambulatory Visit (INDEPENDENT_AMBULATORY_CARE_PROVIDER_SITE_OTHER): Payer: Medicare Other | Admitting: Internal Medicine

## 2016-03-02 ENCOUNTER — Encounter: Payer: Self-pay | Admitting: Internal Medicine

## 2016-03-02 VITALS — BP 118/70 | HR 75 | Ht 61.0 in | Wt 124.0 lb

## 2016-03-02 DIAGNOSIS — A310 Pulmonary mycobacterial infection: Secondary | ICD-10-CM | POA: Diagnosis not present

## 2016-03-02 DIAGNOSIS — J471 Bronchiectasis with (acute) exacerbation: Secondary | ICD-10-CM

## 2016-03-02 DIAGNOSIS — J479 Bronchiectasis, uncomplicated: Secondary | ICD-10-CM

## 2016-03-02 MED ORDER — OSELTAMIVIR PHOSPHATE 75 MG PO CAPS: 75.0000 mg | ORAL_CAPSULE | Freq: Two times a day (BID) | ORAL | 0 refills | Status: DC

## 2016-03-02 MED ORDER — AZITHROMYCIN 250 MG PO TABS: ORAL_TABLET | ORAL | 11 refills | Status: DC

## 2016-03-02 NOTE — Patient Instructions (Addendum)
Start on zithromax 250 mg daily until return   For cough use mucinex or mucinex dm up to 1200 mg every 12 hours and the flutter as much as possible  For severe cough not responding to above Try prilosec otc 20mg   Take 30-60 min before first meal of the day and Pepcid ac (famotidine) 20 mg one @  bedtime until cough is completely gone for at least a week without the need for cough suppression      GERD (REFLUX)  is an extremely common cause of respiratory symptoms just like yours , many times with no obvious heartburn at all.    It can be treated with medication, but also with lifestyle changes including elevation of the head of your bed (ideally with 6 inch  bed blocks),  Smoking cessation, avoidance of late meals, excessive alcohol, and avoid fatty foods, chocolate, peppermint, colas, red wine, and acidic juices such as orange juice.  NO MINT OR MENTHOL PRODUCTS SO NO COUGH DROPS   USE SUGARLESS CANDY INSTEAD (Jolley ranchers or Stover's or Life Savers) or even ice chips will also do - the key is to swallow to prevent all throat clearing. NO OIL BASED VITAMINS - use powdered substitutes.    See Tammy NP w/in 2 weeks with all your medications, even over the counter meds, separated in two separate bags, the ones you take no matter what vs the ones you stop once you feel better and take only as needed when you feel you need them.   Tammy  will generate for you a new user friendly medication calendar that will put [place] all on the same page re: your medication use.     Without this process, it simply isn't possible to assure that we are providing  your outpatient care  with  the attention to detail we feel you deserve.   If we cannot assure that you're getting that kind of care,  then we cannot manage your problem effectively from this clinic.  Once you have seen Tammy and we are sure that we're all on the same page with your medication use she will arrange follow up with me.

## 2016-03-02 NOTE — Assessment & Plan Note (Signed)
-  08/08/07 FOB with classic cobblestoning and MAI on culture.  - Rx 08/2007   To 08/2009 with ETH/Zmax - restarted empirically 05/31/14 > stopped 09/03/14 no benefit perceived, no change on cxr  - restart zmax daily 03/02/2016    Discussed in detail all the  indications, usual  risks and alternatives  relative to the benefits with patient who agrees to proceed with daily zmax use as in retropect did much better while on it  see avs for instructions unique to this ov  .

## 2016-03-02 NOTE — Progress Notes (Signed)
Subjective:     Patient ID: Belinda Day, female   DOB: 12-Jan-1946    MRN: [id number]   Brief patient profile:  57  yowf quit smoking 1972 with documented right middle lobe  syndrome and evidence of bronchiectasis by CT scan in March 2002   History of Present Illness  08/08/07 FOB with classic cobblestoning and MAI on culture.   08/15/07 given Levaquin x 10 days with resolution bloody mucus, but "felt she had flu the whole time" with aches, feverish   August 29, 2007 ov: first post bronch co still coughing up mucus clear and initiate rx with symbicort/ [place]   October 19, 2007 ov no cough , sob, feeling great but no improvement on cxr   December 07, 2007 ov feeling great, minimal am cough not productive. No sob.   Opth eval, labs ok 10/2007   September 06, 2008 ov overall better over the last year, less tendency to exac on zmax and ethambutol. rec complete another year > satisfied improved 90% and stopped zmax and eth 08/2009   05/31/2014 f/u ov/Calene Paradiso re: bronchiectasis/chronic airflow obst/  requesting to restart maint zmax/eth Chief Complaint  Patient presents with  . Follow-up    cough still there; SOB at times   Cough worse esp in am > yellow mucus x sev tsp x months  Rare saba need on symbicort 160 2bid rec Zithromax 250 mg daily and ethambutol is 400mg  twice x 3 months  See your eye doctor as soon as possible to let him know you have started ethambutol and stop it if having any vision problems     09/03/2014 f/u ov/Burlin Mcnair re: bronchiectasis/ airflow obst/ no better cough on zmax/etham  Chief Complaint  Patient presents with  . Follow-up    Pt states that her cough comes and goes and is occ prod with light yellow sputum.    Breathing is worse in heat / takes one hour to get mucus up each am  rec Only use your albuterol as a rescue medication  Stay on symbicort Add Try prilosec otc 20mg   Take 30-60 min before first meal of the day and Pepcid ac (famotidine)  20 mg one @  bedtime until cough is completely gone for at least a week without the need for cough suppression Add flutter valve today to you am routine   In event of a flare of nasty mucus > zithromax 250 x 2 then one daily x 4 days and stop    10/15/2014  f/u ov/Aiya Keach re: bronchiectasis / maint rx = symb 160 2id  Chief Complaint  Patient presents with  . Follow-up    Feeling a little better.Cough is better but worse in am-pale yellow and early afternoon-dry. Using flutter valve two times a day.Sob with exertion-same,no wheezing.Occass. has "spasms" across abdomen and back when coughing.Needs Proair and Symbicort refills.Wants flu shot  Not limited by breathing from desired activities   rec Take pepcid 20 mg after bfast and supper/or bedtime GERD (REFLUX) diet   01/14/2015  f/u ov/Loris Winrow re: obst bronchiectasis/ symb 160 2bid/ rare saba/ has flutter helps some / much better p adding h2 bid   Chief Complaint  Patient presents with  . Follow-up    pt following for obstruvtive bronchitis: pt states she is finishing up a second round of azithromycin. pt sttaes she does still have a litttle prod cough yellowidh in color, and some wheezing. no c/o of SOB or chest tightness.  Not limited by breathing from desired activities  rec No change in recommendations keep the candy handy to prevent throat clearing   Admit date: 04/17/2015 Discharge date: 04/19/2015   Discharge Diagnoses:  Principal Problem:  Influenza A Active Problems:  MAI (mycobacterium avium-intracellulare) ([place])  Essential hypertension  CAD (coronary artery disease) s/p CABG 15 yrs ago  Hypothyroidism  GERD (gastroesophageal reflux disease)  Bronchiectasis chronic Hypocalcemia      05/15/2015  Post hosp f/u ov/Romanda Turrubiates re: symb  160 2bid Chief Complaint  Patient presents with  . Follow-up    Cough has been prod with clear, foamy sputum.  She has been using albuterol 2 x daily on average.   still needing more  albuterol than baseline despite good hfa technique with symbiort 160 and confirme using it 2 bid No noct resp complaints  Doe = MMRC2 = can't walk a nl pace on a flat grade s sob but does fine slow and flat eg grocery shopping  rec Plan A = Automatic =  symbicort 160 Take 2 puffs first thing in am and then another 2 puffs about 12 hours later.  Plan B = Backup Only use your albuterol (proair) as a rescue medication  For congested/rattling cough > either mucinex or mucinex dm up to 1200 mg every 12 hours as needs but use the flutter as much as possible For dry raspy cough > benzoate 100 mg up to 4 x daily    08/14/2015  f/u ov/Avynn Klassen re:  obst bronchiectasis on symbicort 160 2bid  Chief Complaint  Patient presents with  . Follow-up    Cough still the same,coughed up bright red bld. on 2 occassions since last ov,lasted 1 day each-felt like sorethroat at the time,sob occass.,worse with humidity,denies cp or tightness,no fcs.Saw Dr. Stanford Breed last wk. everything was good.  just two episodes hemoptysis  x one month while on asa but the total may approach 2 tbsp in 24 h rec Hold aspirin anytime you notice more blood than usual  > stopped aspirin permanently For definite change in mucus > Zpak     10/16/2015  f/u ov/Sheritta Deeg re: obst bronchiectasis/ flare on symb 160 2bid and prn zpak Chief Complaint  Patient presents with  . Acute Visit    c/o coughing up bld.bright red 3 episodes since last ov. Took Z-pak each time. Last time finished was Sept. 14th. Not coughing up bld. now,no cough now. Off Aspirin.Shaky today.Using Albuterol 2-3 times a wk.Sob same,occass. wheezing.Denies cp or tightness, No fcs.  no   Purulent sputum just blood, chest discomfort with cough but does not lateralize Never coughed up more than a few tbsp per day Not limited by breathing from desired activities   rec Change zithromax 250 mg daily and reduce the symbicort to 80 Take 2 puffs first thing in am and then another 2 puffs  about 12 hours later.  When cough flares, cough into the flutter valve and use the mucinex up to 1200 mg every 12 hours as needed  If mucus turns nasty > omnicef 300 mg twice daily x 10 days  Please schedule a follow up office visit in 6 weeks (change the appointment)  lated add:  omnicef should be 7 days for now > called to correct     11/27/2015  f/u ov/Pelagia Iacobucci re: obstructive bronchiectasis / flare since 10/28 started omnicef 11/25/15  No saba  Chief Complaint  Patient presents with  . Follow-up    Started coughing up blood again 4 days  ago- started on omnicef.   never more than a few tbsp per day / no longer on asa  Not limited by breathing from desired activities   rec No change rx    03/02/2016  f/u ov/Deneisha Dade re:  obst bronchiectasis  Off zmax/ on symb 80 2bid  Chief Complaint  Patient presents with  . Follow-up    Cough is unchanged. She is using proair 3 x wkly on average. Last course of Omnicef was taken in mid Dec 2017.   did better on zmax with less cough / less am am cough / congestion worse in am x 2-3 tbsp thick clear mucus x sev hours but no noct cough  On mucinex 600 one twice daily max but confused with dosing and also taking Rob dm       No obvious patterns  day to day or  daytime variabilty or assoc  purulent sputum or mucus plugs  chest tightness,  overt sinus or hb symptoms. No unusual exp hx or h/o childhood pna/ asthma or premature birth to her knowledge.   Sleeping ok without nocturnal  or early am exacerbation  of respiratory  c/o's or need for noct saba. Also denies any obvious fluctuation of symptoms with weather or environmental changes or other aggravating or alleviating factors except as outlined above      ROS  The following are not active complaints unless bolded sore throat, dysphagia, dental problems, itching, sneezing,  nasal congestion or excess/ purulent secretions, ear ache,   fever, chills, sweats, unintended wt loss, pleuritic or exertional cp,  hemoptysis,  orthopnea pnd or leg swelling, presyncope, palpitations, heartburn, abdominal pain, anorexia, nausea, vomiting, diarrhea  or change in bowel or urinary habits, change in stools or urine, dysuria,hematuria,  rash, arthralgias, visual complaints, headache, numbness weakness or ataxia or problems with walking or coordination,  change in mood/affect or memory.       Past Medical History:  Bronchiectasis see CT SE 04/13/00  - HFA 75% November 19, 2009  - alpha one screen 01/14/2015 >>>  - IgE 01/14/2015 >>>  MAI  - Rx Zmax and ETH 08/29/07 > 08/2009 restarted empirically 05/31/14 > 09/03/14 (no change in cough so just use zpak for flares)  - Rx zmax only 10/16/2015 >>>  - [facility] [place]   [place] April 02, 2010  HEALTH MAINTENANCE...........................[name] 10/2007  - Pneumovax 2005   and 10/29/2011 age 26, prevnar 08/16/2013  CAD  Hyperlipidemia  Hypertension  History of cough with ACE inhibition.  Gastroesophageal reflux disease         Objective:   Physical Exam   In general she is an extremely  pleasant ambulatory white female / nad   Vital signs reviewed  - note sats 98% on arrival    Wt 130 October 19, 2007>140 March 31, 2010 > 127 07/16/2010 > 10/14/2010  120 > 05/04/2011  117 > 07/30/2011  120 > 10/29/2011 118 > 130  05/01/2012 > 12/12/2012 132 >  08/14/13 137 >    02/20/2014  137 >  05/31/2014 134 > 09/03/2014    140 > 10/15/2014 137 > 01/14/2015 137 >  05/15/2015 123 >08/14/2015  124 >  10/16/2015 126 > 03/02/2016   124   HEENT: nl dentition, turbinates, and orophanx. Nl external ear canals without cough reflex  Neck without JVD/Nodes/TM  Lungs   Very minimal insp pops bilaterally  Bases R > L  with prominent  sym mid  Exp rhonchi bilaterally  RRR no s3 or murmur or increase in P2  Abd soft and benign with nl excursion in the supine position. No bruits or organomegaly  Ext warm without calf tenderness, cyanosis  clubbing or edema        CXR PA and Lateral:   11/27/2015 :    I personally reviewed images and agree with radiology impression as follows:    Stable chronic bronchitic changes. Parenchymal scarring is also stable bilaterally. No CHF, pneumonia, nor other acute cardiopulmonary abnormality       Assessment:

## 2016-03-02 NOTE — Assessment & Plan Note (Signed)
-   PFT's 10/14/2010  FEV1  1.25 (68%) and ratio 58% and DLCO 90%     - PFT's 10/29/2011  FEV1  1.33 (74%) and ratio 57 % and DLCO 93%    - PFTs 08/14/2013   FEV1  1.16 (60%) and ratio 61 with dlco 83%     - Flutter valve added 09/03/14      - alpha one   01/14/2015 >  MM, level 147     - IgE  01/14/15  11 - 10/16/2015 changed to symbicort 80 2bid (? Higher doses contributing to w MAI /freq of infections)   - 03/02/2016  After extensive coaching HFA effectiveness =    90%    Poor control of airways despite excellent hfa so rec  Add back zmax (see separate a/p)  Add gerd rx when coughing  Return with all meds in hand using a trust but verify approach to confirm accurate Medication  Reconciliation The principal here is that until we are certain that the  patients are doing what we've asked, it makes no sense to ask them to do more.  To keep things simple, I have asked the patient to first separate medicines that are perceived as maintenance, that is to be taken daily "no matter what", from those medicines that are taken on only on an as-needed basis and I have given the patient examples of both, and then return to see our NP to generate a  detailed  medication calendar which should be followed until the next physician sees the patient and updates it.

## 2016-04-01 ENCOUNTER — Ambulatory Visit (INDEPENDENT_AMBULATORY_CARE_PROVIDER_SITE_OTHER): Payer: Medicare Other | Admitting: Adult Health

## 2016-04-01 ENCOUNTER — Encounter: Payer: Self-pay | Admitting: Adult Health

## 2016-04-01 DIAGNOSIS — J471 Bronchiectasis with (acute) exacerbation: Secondary | ICD-10-CM

## 2016-04-01 DIAGNOSIS — J479 Bronchiectasis, uncomplicated: Secondary | ICD-10-CM

## 2016-04-01 NOTE — Assessment & Plan Note (Signed)
Recurrent flare   Cont on abx  Add steroid taper for airway inflammation  Add VEST -persistent sx despite flutter/chest PT   Plan  Patient Instructions  Continue on Azithromycin daily .  Continue on Mucinex and Flutter valve .  Prednisone taper over next week.  VEST order sent in .  Continue on Symbicort .  follow up Dr. Melvyn Novas  In 4-6 weeks and As needed   Please contact office for sooner follow up if symptoms do not improve or worsen or seek emergency care

## 2016-04-01 NOTE — Progress Notes (Signed)
Chart and office note reviewed in detail  > agree with a/p as outlined    

## 2016-04-01 NOTE — Patient Instructions (Signed)
Continue on Azithromycin daily .  Continue on Mucinex and Flutter valve .  Prednisone taper over next week.  VEST order sent in .  Continue on Symbicort .  follow up Dr. Melvyn Novas  In 4-6 weeks and As needed   Please contact office for sooner follow up if symptoms do not improve or worsen or seek emergency care

## 2016-04-01 NOTE — Progress Notes (Signed)
@Patient  ID: Belinda Day, female    DOB: [birth year], 71 y.o.   MRN: [id number]  Chief Complaint  Patient presents with  . Follow-up    Referring provider: Marin Olp, MD  HPI: [id number]  yowf quit smoking 1972 with documented right middle lobe  syndrome and evidence of bronchiectasis by CT scan in March 2002  04/01/2016 Follow up : Bronchiectasis  Pt returns for 1 month follow up . Last ov with ongoing bronchiectasis symptoms with increased cough and congestion . Re-started on Zithromycin 250mg  daily .  Cough is slightly better w/ no bloody mucus . Cough most days with yellow mucus, as day goes on with clear mucus. Uses Flutter valve Twice daily and  chest percussion but still has thick mucus that is hard to get up .  CXR in 11/2015 w/ chronic changes.   Over last week with wheezing , hoarseness, drainage and increased cough .  Remains on Symbicort .  Reviewed all her meds and updated MAR. She is taking meds correctly  Med calendar system was down today .    Allergies  Allergen Reactions  . Bactrim [Sulfamethoxazole-Trimethoprim] Hives, Itching and Other (See Comments)    Bruised like areas on body  . Ciprofloxacin Other (See Comments)    Body aches  . Codeine Nausea Only    REACTION: nausea  . Erythromycin Nausea Only    REACTION: nausea  . Levofloxacin Other (See Comments)    REACTION: aches    Immunization History  Administered Date(s) Administered  . H1N1 01/02/2008  . Influenza Split 10/14/2010, 10/07/2011  . Influenza,inj,Quad PF,36+ Mos 10/02/2012, 10/16/2015  . Influenza-Unspecified 09/25/2013, 10/15/2014  . Pneumococcal Conjugate-13 08/14/2013  . Pneumococcal Polysaccharide-23 10/29/2011    Past Medical History:  Diagnosis Date  . Bronchiectasis    oxygen at night in the past  . CAD (coronary artery disease)   . Diverticulitis 2014  . GERD (gastroesophageal reflux disease)   . History of shingles 04/2012  . HTN (hypertension)   . Hyperlipidemia     . Hypothyroidism   . MAI (mycobacterium avium-intracellulare) ([place])   . Neuritis of upper extremity     Tobacco History: History  Smoking Status  . Former Smoker  . Packs/day: 1.00  . Years: 20.00  . Types: Cigarettes  . Quit date: 01/25/1970  Smokeless Tobacco  . Never Used   Counseling given: Not Answered   Outpatient Encounter Prescriptions as of 04/01/2016  Medication Sig  . acetaminophen (TYLENOL) 650 MG CR tablet every 8 (eight) hours as needed for pain.   Marland Kitchen albuterol (PROAIR HFA) 108 (90 Base) MCG/ACT inhaler Inhale 2 puffs into the lungs every 6 (six) hours as needed for wheezing or shortness of breath.  . ALPRAZolam (XANAX) 0.25 MG tablet take 1 tablet by mouth at bedtime if needed for sleep (Patient taking differently: take 1 tablet by mouth at bedtime)  . amitriptyline (ELAVIL) 50 MG tablet take 1 tablet by mouth at bedtime if needed (Patient taking differently: I TABLET BY MOUTH AT BEDTIME)  . aspirin EC 81 MG tablet Take 81 mg by mouth every other day.  Marland Kitchen atorvastatin (LIPITOR) 80 MG tablet Take 1 tablet (80 mg total) by mouth daily.  Marland Kitchen azithromycin (ZITHROMAX) 250 MG tablet Take one dialy  . budesonide-formoterol (SYMBICORT) 80-4.5 MCG/ACT inhaler Take 2 puffs first thing in am and then another 2 puffs about 12 hours later.  . cefdinir (OMNICEF) 300 MG capsule Take 1 capsule (300 mg total) by mouth 2 (  two) times daily. (Patient not taking: Reported on 03/02/2016)  . guaiFENesin (MUCINEX) 600 MG 12 hr tablet Take 600 mg by mouth 2 (two) times daily.  Marland Kitchen guaiFENesin-dextromethorphan (ROBITUSSIN DM) 100-10 MG/5ML syrup Take 5 mLs by mouth every 4 (four) hours as needed for cough.  . metFORMIN (GLUCOPHAGE) 500 MG tablet Take 250 mg by mouth daily with breakfast.  . nebivolol (BYSTOLIC) 2.5 MG tablet Take 1 tablet (2.5 mg total) by mouth daily.  Marland Kitchen oseltamivir (TAMIFLU) 75 MG capsule Take 1 capsule (75 mg total) by mouth 2 (two) times daily.  Marland Kitchen Respiratory Therapy Supplies  (FLUTTER) DEVI Use as directed  . SYNTHROID 75 MCG tablet take 1 tablet by mouth 6 DAYS A WEEK THEN TAKE 1 AND 1/2  TABLETS ON THE 7TH DAY  . valsartan (DIOVAN) 80 MG tablet Take 1 tablet (80 mg total) by mouth daily.   No facility-administered encounter medications on file as of 04/01/2016.      Review of Systems  Constitutional:   No  weight loss, night sweats,  Fevers, chills, + fatigue, or  lassitude.  HEENT:   No headaches,  Difficulty swallowing,  Tooth/dental problems, or  Sore throat,                No sneezing, itching, ear ache,+ nasal congestion, post nasal drip,   CV:  No chest pain,  Orthopnea, PND, swelling in lower extremities, anasarca, dizziness, palpitations, syncope.   GI  No heartburn, indigestion, abdominal pain, nausea, vomiting, diarrhea, change in bowel habits, loss of appetite, bloody stools.   Resp:    No chest wall deformity  Skin: no rash or lesions.  GU: no dysuria, change in color of urine, no urgency or frequency.  No flank pain, no hematuria   MS:  No joint pain or swelling.  No decreased range of motion.  No back pain.    Physical Exam  BP 130/70 (BP Location: Right Arm, Patient Position: Sitting, Cuff Size: Normal)   Pulse 65   Ht 5\' 1"  (1.549 m)   Wt 127 lb 3.2 oz (57.7 kg)   SpO2 96%   BMI 24.03 kg/m   GEN: A/Ox3; pleasant , NAD, frail and elderly    HEENT:  [place]/AT,  EACs-clear, TMs-wnl, NOSE-clear, THROAT-clear, no lesions, no postnasal drip or exudate noted.   NECK:  Supple w/ fair ROM; no JVD; normal carotid impulses w/o bruits; no thyromegaly or nodules palpated; no lymphadenopathy.    RESP  Scattered rhonchi w/ few wheezes,  no accessory muscle use, no dullness to percussion  CARD:  RRR, no m/r/g, no peripheral edema, pulses intact, no cyanosis or clubbing.  GI:   Soft & nt; nml bowel sounds; no organomegaly or masses detected.   Musco: Warm bil, no deformities or joint swelling noted.   Neuro: alert, no focal deficits noted.     Skin: Warm, no lesions or rashes    Lab Results:  CBC  BNP ProBNP No results found for: PROBNP  Imaging: No results found.   Assessment & Plan:   No problem-specific Assessment & Plan notes found for this encounter.     Rexene Edison, NP 04/01/2016

## 2016-04-02 ENCOUNTER — Telehealth: Payer: Self-pay | Admitting: Adult Health

## 2016-04-02 MED ORDER — PREDNISONE 10 MG PO TABS: ORAL_TABLET | ORAL | 0 refills | Status: DC

## 2016-04-02 NOTE — Telephone Encounter (Signed)
Sorry about that  Prednisone 10mg  , 4 tabs for 2 days, then 3 tabs for 2 days, 2 tabs for 2 days, then 1 tab for 2 days, then stop #20 . No refills Please contact office for sooner follow up if symptoms do not improve or worsen or seek emergency care

## 2016-04-02 NOTE — Telephone Encounter (Signed)
Spoke with pt. She is aware of TP's response. Rx has been sent in. Nothing further was needed.

## 2016-04-02 NOTE — Telephone Encounter (Signed)
TP  Please Advise-  Pt saw you yesterday and was suppose to have a prednisone taper called in. Please let us know what pred taper was it that you wanted to send in.    Patient Instructions   Continue on Azithromycin daily .  Continue on Mucinex and Flutter valve .  Prednisone taper over next week.  VEST order sent in .  Continue on Symbicort .  follow up Dr. Melvyn Novas  In 4-6 weeks and As needed   Please contact office for sooner follow up if symptoms do not improve or worsen or seek emergency care

## 2016-04-15 ENCOUNTER — Other Ambulatory Visit: Payer: Self-pay | Admitting: Cardiology

## 2016-04-19 ENCOUNTER — Other Ambulatory Visit: Payer: Self-pay | Admitting: Cardiology

## 2016-04-19 NOTE — Telephone Encounter (Signed)
Medication Detail    Disp Refills Start End   atorvastatin (LIPITOR) 80 MG tablet 90 tablet 3 04/16/2016    Sig - Route: Take 1 tablet (80 mg total) by mouth daily. - Oral   Notes to Pharmacy: PT NEEDS TO SCHEDULE AN APPT FOR FUTURE REFILLS   E-Prescribing Status: Receipt confirmed by pharmacy (04/16/2016 5:10 PM EDT)   Pharmacy   [id number], [place].

## 2016-04-28 ENCOUNTER — Other Ambulatory Visit: Payer: Self-pay | Admitting: Family Medicine

## 2016-04-28 DIAGNOSIS — K648 Other hemorrhoids: Secondary | ICD-10-CM | POA: Diagnosis not present

## 2016-04-28 DIAGNOSIS — K5909 Other constipation: Secondary | ICD-10-CM | POA: Diagnosis not present

## 2016-05-04 ENCOUNTER — Ambulatory Visit (INDEPENDENT_AMBULATORY_CARE_PROVIDER_SITE_OTHER): Payer: Medicare Other | Admitting: Internal Medicine

## 2016-05-04 ENCOUNTER — Ambulatory Visit (INDEPENDENT_AMBULATORY_CARE_PROVIDER_SITE_OTHER)
Admission: RE | Admit: 2016-05-04 | Discharge: 2016-05-04 | Disposition: A | Discharge status: Home / Self Care | Payer: Medicare Other | Source: Ambulatory Visit | Attending: Internal Medicine | Admitting: Internal Medicine

## 2016-05-04 ENCOUNTER — Encounter: Payer: Self-pay | Admitting: Internal Medicine

## 2016-05-04 VITALS — BP 122/78 | HR 77 | Ht 61.0 in | Wt 126.4 lb

## 2016-05-04 DIAGNOSIS — R0602 Shortness of breath: Secondary | ICD-10-CM | POA: Diagnosis not present

## 2016-05-04 DIAGNOSIS — J471 Bronchiectasis with (acute) exacerbation: Secondary | ICD-10-CM

## 2016-05-04 DIAGNOSIS — J479 Bronchiectasis, uncomplicated: Secondary | ICD-10-CM

## 2016-05-04 DIAGNOSIS — R05 Cough: Secondary | ICD-10-CM | POA: Diagnosis not present

## 2016-05-04 NOTE — Patient Instructions (Addendum)
For cough mucinex dm total is 1200 mg every 12 hours as needed   Please see patient coordinator before you leave today  to schedule VEST and use it at least 4 x daily but esp in am and when the cough starts back up in pm and at bedtime   Please remember to go to the  x-ray department downstairs in the basement  for your tests - we will call you with the results when they are available.     Please schedule a follow up visit in 3 months but call sooner if needed

## 2016-05-04 NOTE — Progress Notes (Signed)
Subjective:     Patient ID: Belinda Day, female   DOB: 1945-10-31    MRN: [id number]   Brief patient profile:  71  yowf quit smoking 1972 with documented right middle lobe  syndrome and evidence of bronchiectasis by CT scan in March 2002   History of Present Illness  08/08/07 FOB with classic cobblestoning and MAI on culture.   08/15/07 given Levaquin x 10 days with resolution bloody mucus, but "felt she had flu the whole time" with aches, feverish   August 29, 2007 ov: first post bronch co still coughing up mucus clear and initiate rx with symbicort/ [place]   October 19, 2007 ov no cough , sob, feeling great but no improvement on cxr   December 07, 2007 ov feeling great, minimal am cough not productive. No sob.   Opth eval, labs ok 10/2007   September 06, 2008 ov overall better over the last year, less tendency to exac on zmax and ethambutol. rec complete another year > satisfied improved 90% and stopped zmax and eth 08/2009   05/31/2014 f/u ov/Wert re: bronchiectasis/chronic airflow obst/  requesting to restart maint zmax/eth Chief Complaint  Patient presents with  . Follow-up    cough still there; SOB at times   Cough worse esp in am > yellow mucus x sev tsp x months  Rare saba need on symbicort 160 2bid rec Zithromax 250 mg daily and ethambutol is 400mg  twice x 3 months  See your eye doctor as soon as possible to let him know you have started ethambutol and stop it if having any vision problems     09/03/2014 f/u ov/Wert re: bronchiectasis/ airflow obst/ no better cough on zmax/etham  Chief Complaint  Patient presents with  . Follow-up    Pt states that her cough comes and goes and is occ prod with light yellow sputum.    Breathing is worse in heat / takes one hour to get mucus up each am  rec Only use your albuterol as a rescue medication  Stay on symbicort Add Try prilosec otc 20mg   Take 30-60 min before first meal of the day and Pepcid ac (famotidine)  20 mg one @  bedtime until cough is completely gone for at least a week without the need for cough suppression Add flutter valve today to you am routine   In event of a flare of nasty mucus > zithromax 250 x 2 then one daily x 4 days and stop    10/15/2014  f/u ov/Wert re: bronchiectasis / maint rx = symb 160 2id  Chief Complaint  Patient presents with  . Follow-up    Feeling a little better.Cough is better but worse in am-pale yellow and early afternoon-dry. Using flutter valve two times a day.Sob with exertion-same,no wheezing.Occass. has "spasms" across abdomen and back when coughing.Needs Proair and Symbicort refills.Wants flu shot  Not limited by breathing from desired activities   rec Take pepcid 20 mg after bfast and supper/or bedtime GERD (REFLUX) diet   01/14/2015  f/u ov/Wert re: obst bronchiectasis/ symb 160 2bid/ rare saba/ has flutter helps some / much better p adding h2 bid   Chief Complaint  Patient presents with  . Follow-up    pt following for obstruvtive bronchitis: pt states she is finishing up a second round of azithromycin. pt sttaes she does still have a litttle prod cough yellowidh in color, and some wheezing. no c/o of SOB or chest tightness.  Not limited by breathing from desired activities  rec No change in recommendations keep the candy handy to prevent throat clearing   Admit date: 04/17/2015 Discharge date: 04/19/2015   Discharge Diagnoses:  Principal Problem:  Influenza A Active Problems:  MAI (mycobacterium avium-intracellulare) ([place])  Essential hypertension  CAD (coronary artery disease) s/p CABG 15 yrs ago  Hypothyroidism  GERD (gastroesophageal reflux disease)  Bronchiectasis chronic Hypocalcemia      05/15/2015  Post hosp f/u ov/Wert re: symb  160 2bid Chief Complaint  Patient presents with  . Follow-up    Cough has been prod with clear, foamy sputum.  She has been using albuterol 2 x daily on average.   still needing more  albuterol than baseline despite good hfa technique with symbiort 160 and confirme using it 2 bid No noct resp complaints  Doe = MMRC2 = can't walk a nl pace on a flat grade s sob but does fine slow and flat eg grocery shopping  rec Plan A = Automatic =  symbicort 160 Take 2 puffs first thing in am and then another 2 puffs about 12 hours later.  Plan B = Backup Only use your albuterol (proair) as a rescue medication  For congested/rattling cough > either mucinex or mucinex dm up to 1200 mg every 12 hours as needs but use the flutter as much as possible For dry raspy cough > benzoate 100 mg up to 4 x daily    08/14/2015  f/u ov/Wert re:  obst bronchiectasis on symbicort 160 2bid  Chief Complaint  Patient presents with  . Follow-up    Cough still the same,coughed up bright red bld. on 2 occassions since last ov,lasted 1 day each-felt like sorethroat at the time,sob occass.,worse with humidity,denies cp or tightness,no fcs.Saw Dr. Stanford Breed last wk. everything was good.  just two episodes hemoptysis  x one month while on asa but the total may approach 2 tbsp in 24 h rec Hold aspirin anytime you notice more blood than usual  > stopped aspirin permanently For definite change in mucus > Zpak     10/16/2015  f/u ov/Wert re: obst bronchiectasis/ flare on symb 160 2bid and prn zpak Chief Complaint  Patient presents with  . Acute Visit    c/o coughing up bld.bright red 3 episodes since last ov. Took Z-pak each time. Last time finished was Sept. 14th. Not coughing up bld. now,no cough now. Off Aspirin.Shaky today.Using Albuterol 2-3 times a wk.Sob same,occass. wheezing.Denies cp or tightness, No fcs.  no   Purulent sputum just blood, chest discomfort with cough but does not lateralize Never coughed up more than a few tbsp per day Not limited by breathing from desired activities   rec Change zithromax 250 mg daily and reduce the symbicort to 80 Take 2 puffs first thing in am and then another 2 puffs  about 12 hours later.  When cough flares, cough into the flutter valve and use the mucinex up to 1200 mg every 12 hours as needed  If mucus turns nasty > omnicef 300 mg twice daily x 10 days  Please schedule a follow up office visit in 6 weeks (change the appointment)  lated add:  omnicef should be 7 days for now > called to correct     11/27/2015  f/u ov/Wert re: obstructive bronchiectasis / flare since 10/28 started omnicef 11/25/15  No saba  Chief Complaint  Patient presents with  . Follow-up    Started coughing up blood again 4 days  ago- started on omnicef.   never more than a few tbsp per day / no longer on asa  Not limited by breathing from desired activities   rec No change rx    03/02/2016  f/u ov/Wert re:  obst bronchiectasis  Off zmax/ on symb 80 2bid  Chief Complaint  Patient presents with  . Follow-up    Cough is unchanged. She is using proair 3 x wkly on average. Last course of Omnicef was taken in mid Dec 2017.   did better on zmax with less cough / less am am cough / congestion worse in am x 2-3 tbsp thick clear mucus x sev hours but no noct cough  On mucinex 600 one twice daily max but confused with dosing and also taking Rob dm  rec Continue on Azithromycin daily .  Continue on Mucinex and Flutter valve .  Prednisone taper over next week.  VEST order sent in .  Continue on Symbicort .     05/04/2016  f/u ov/Wert re: obst bronchiectasis on zmax 250 mg daily / symb 80 2bid and maybe twice weekly saba Chief Complaint  Patient presents with  . Follow-up    Doing well overall. She uses proair 2 x per wk as needed.   worse 1st thing in am x 3 hours maybe a cup and after supper repeats > mucus yellowish,thick not bloody /using flutter valve 4 x in 3hours with marginal benefit / already on daily zmax   No obvious day to day or daytime variability or assoc  mucus plugs or hemoptysis or cp or chest tightness, subjective wheeze or overt sinus or hb symptoms. No unusual  exp hx or h/o childhood pna/ asthma or knowledge of premature birth.  Sleeping ok without nocturnal  or early am exacerbation  of respiratory  c/o's or need for noct saba. Also denies any obvious fluctuation of symptoms with weather or environmental changes or other aggravating or alleviating factors except as outlined above   Current Medications, Allergies, Complete Past Medical History, Past Surgical History, Family History, and Social History were reviewed in [employer] record.  ROS  The following are not active complaints unless bolded sore throat, dysphagia, dental problems, itching, sneezing,  nasal congestion or excess/ purulent secretions, ear ache,   fever, chills, sweats, unintended wt loss, classically pleuritic or exertional cp,  orthopnea pnd or leg swelling, presyncope, palpitations, abdominal pain, anorexia, nausea, vomiting, diarrhea  or change in bowel or bladder habits, change in stools or urine, dysuria,hematuria,  rash, arthralgias, visual complaints, headache, numbness, weakness or ataxia or problems with walking or coordination,  change in mood/affect or memory.                   Past Medical History:  Bronchiectasis see CT SE 04/13/00  - HFA 75% November 19, 2009  - alpha one screen 01/14/2015 >>>  - IgE 01/14/2015 >>>  MAI  - Rx Zmax and ETH 08/29/07 > 08/2009 restarted empirically 05/31/14 > 09/03/14 (no change in cough so just use zpak for flares)  - Rx zmax maint  10/16/2015 >>>  - Eye eval   10/09....................[place] so try cycles of cipro April 02, 2010  HEALTH MAINTENANCE...........................[name] - Td 10/2007  - Pneumovax 2005   and 10/29/2011 age 14, prevnar 08/16/2013  CAD  Hyperlipidemia  Hypertension  History of cough with ACE inhibition.  Gastroesophageal reflux disease         Objective:  Physical Exam   In general she is an extremely  pleasant ambulatory white female  Vital signs  reviewed  - note sats 97% on arrival RA   Wt 130 October 19, 2007>140 March 31, 2010 > 127 07/16/2010 > 10/14/2010  120 > 05/04/2011  117 > 07/30/2011  120 > 10/29/2011 118 > 130  05/01/2012 > 12/12/2012 132 >  08/14/13 137 >    02/20/2014  137 >  05/31/2014 134 > 09/03/2014    140 > 10/15/2014 137 > 01/14/2015 137 >  05/15/2015 123 >08/14/2015  124 >  10/16/2015 126 > 03/02/2016   124  > 05/04/2016  126   HEENT: nl dentition, turbinates, and orophanx. Nl external ear canals without cough reflex  Neck without JVD/Nodes/TM  Lungs   Very minimal insp pops squeaks both Bases R > L  with prominent sym mid to late  Exp rhonchi bilaterally  RRR no s3 or murmur or increase in P2  Abd soft and benign with nl excursion in the supine position. No bruits or organomegaly  Ext warm without calf tenderness, cyanosis clubbing or edema     CXR PA and Lateral:   05/04/2016 :    I personally reviewed images and agree with radiology impression as follows:   Stable chronic change as noted above.  No definite active process.           Assessment:

## 2016-05-05 NOTE — Progress Notes (Signed)
LMTCB

## 2016-05-06 ENCOUNTER — Telehealth: Payer: Self-pay | Admitting: Internal Medicine

## 2016-05-06 NOTE — Assessment & Plan Note (Signed)
-   PFT's 10/14/2010  FEV1  1.25 (68%) and ratio 58% and DLCO 90%     - PFT's 10/29/2011  FEV1  1.33 (74%) and ratio 57 % and DLCO 93%    - PFTs 08/14/2013   FEV1  1.16 (60%) and ratio 61 with dlco 83%     - Flutter valve added 09/03/14      - alpha one   01/14/2015 >  MM, level 147     - IgE  01/14/15  11 - CT chest 04/17/15 Marked chronic bronchiectasis and volume loss in the right middle lobe with milder bronchiectasis, bronchial wall thickening, and nodular densities throughout the right upper and right lower lobe suggestive of chronic endobronchial/atypical mycobacterial infection. - 10/16/2015 changed to symbicort 80 2bid (? Higher doses contributing to w MAI /freq of infections)   - 03/02/2016  After extensive coaching HFA effectiveness =    90%  - 05/04/2016 VEST ordered (2nd request)    CT Done  04/17/15 as above and    I have reviewed this study and have found bronchiectasis to be present  No care giver available to administer effective chest physiotherapy Has not responded to alternative therapy - pt has flutter valve and used it but it did not effectively mobilize lung secretions During today's visit pt  reports productive cough > 6 m duration  requiring antibiotics (daily zmax)   Pt would benefit from trial of VEST therapy at this point > ordered for the second time today.    I had an extended discussion with the patient reviewing all relevant studies completed to date and  lasting 15 to 20 minutes of a 25 minute visit    Each maintenance medication was reviewed in detail including most importantly the difference between maintenance and prns and under what circumstances the prns are to be triggered using an action plan format that is not reflected in the computer generated alphabetically organized AVS.    Please see AVS for specific instructions unique to this visit that I personally wrote and verbalized to the the pt in detail and then reviewed with pt  by my nurse highlighting any   changes in therapy recommended at today's visit to their plan of care.

## 2016-05-06 NOTE — Telephone Encounter (Signed)
Spoke with pt, who had questions in regards to the smart vest order. Pt states she was contacted by [facility] and, was asked how many admissions she had in the past year. Pt is concerned because her last admission was 04-17-15. I advised pt if any further information is needed, we would be contacted. Pt voiced he understanding and had no further questions. Nothing further needed.

## 2016-05-06 NOTE — Progress Notes (Signed)
Spoke with pt and notified of results per Dr. Wert. Pt verbalized understanding and denied any questions. 

## 2016-05-20 DIAGNOSIS — J449 Chronic obstructive pulmonary disease, unspecified: Secondary | ICD-10-CM | POA: Diagnosis not present

## 2016-05-20 DIAGNOSIS — J4550 Severe persistent asthma, uncomplicated: Secondary | ICD-10-CM | POA: Diagnosis not present

## 2016-05-20 DIAGNOSIS — J471 Bronchiectasis with (acute) exacerbation: Secondary | ICD-10-CM | POA: Diagnosis not present

## 2016-06-06 ENCOUNTER — Other Ambulatory Visit: Payer: Self-pay | Admitting: Cardiology

## 2016-06-07 NOTE — Telephone Encounter (Signed)
Rx(s) sent to pharmacy electronically.  

## 2016-06-19 DIAGNOSIS — J471 Bronchiectasis with (acute) exacerbation: Secondary | ICD-10-CM | POA: Diagnosis not present

## 2016-06-19 DIAGNOSIS — J4550 Severe persistent asthma, uncomplicated: Secondary | ICD-10-CM | POA: Diagnosis not present

## 2016-06-19 DIAGNOSIS — J449 Chronic obstructive pulmonary disease, unspecified: Secondary | ICD-10-CM | POA: Diagnosis not present

## 2016-06-30 DIAGNOSIS — K581 Irritable bowel syndrome with constipation: Secondary | ICD-10-CM | POA: Diagnosis not present

## 2016-07-20 DIAGNOSIS — J449 Chronic obstructive pulmonary disease, unspecified: Secondary | ICD-10-CM | POA: Diagnosis not present

## 2016-07-20 DIAGNOSIS — J4550 Severe persistent asthma, uncomplicated: Secondary | ICD-10-CM | POA: Diagnosis not present

## 2016-07-20 DIAGNOSIS — J471 Bronchiectasis with (acute) exacerbation: Secondary | ICD-10-CM | POA: Diagnosis not present

## 2016-07-26 ENCOUNTER — Other Ambulatory Visit: Payer: Self-pay

## 2016-07-26 ENCOUNTER — Encounter: Payer: Self-pay | Admitting: Cardiology

## 2016-07-26 ENCOUNTER — Encounter: Payer: Self-pay | Admitting: Family Medicine

## 2016-07-26 ENCOUNTER — Encounter: Payer: Self-pay | Admitting: Internal Medicine

## 2016-08-03 ENCOUNTER — Ambulatory Visit (INDEPENDENT_AMBULATORY_CARE_PROVIDER_SITE_OTHER): Payer: Medicare Other | Admitting: Internal Medicine

## 2016-08-03 ENCOUNTER — Encounter: Payer: Self-pay | Admitting: Internal Medicine

## 2016-08-03 VITALS — BP 110/74 | HR 77 | Ht 61.0 in | Wt 129.8 lb

## 2016-08-03 DIAGNOSIS — A310 Pulmonary mycobacterial infection: Secondary | ICD-10-CM | POA: Diagnosis not present

## 2016-08-03 DIAGNOSIS — J479 Bronchiectasis, uncomplicated: Secondary | ICD-10-CM | POA: Diagnosis not present

## 2016-08-03 NOTE — Assessment & Plan Note (Addendum)
-   PFT's 10/14/2010  FEV1  1.25 (68%) and ratio 58% and DLCO 90%     - PFT's 10/29/2011  FEV1  1.33 (74%) and ratio 57 % and DLCO 93%    - PFTs 08/14/2013   FEV1  1.16 (60%) and ratio 61 with dlco 83%     - Flutter valve added 09/03/14      - alpha one   01/14/2015 >  MM, level 147     - IgE  01/14/15  11 - CT chest 04/17/15 Marked chronic bronchiectasis and volume loss in the right middle lobe with milder bronchiectasis, bronchial wall thickening, and nodular densities throughout the right upper and right lower lobe suggestive of chronic endobronchial/atypical mycobacterial infection. - 10/16/2015 changed to symbicort 80 2bid (? Higher doses contributing to w MAI /freq of infections)   - 03/02/2016  After extensive coaching HFA effectiveness =    90%   - 05/04/2016 VEST stared around May 25 2016 - try off zmax 08/03/2016   Using vest and appears to be benefiting at this point but also needs to member to keep up all her other components of rx including use of flutter and omnicef for flares /reviewed  Each maintenance medication was reviewed in detail including most importantly the difference between maintenance and as needed and under what circumstances the prns are to be used.  Please see AVS for specific  Instructions which are unique to this visit and I personally typed out  which were reviewed in detail in writing with the patient and a copy provided.

## 2016-08-03 NOTE — Assessment & Plan Note (Signed)
-  08/08/07 FOB with classic cobblestoning and MAI on culture.  - Rx 08/2007   To 08/2009 with ETH/Zmax - restarted empirically 05/31/14 > stopped 09/03/14 no benefit perceived, no change on cxr   - restart zmax daily 03/02/2016 > d/c'd  08/03/2016 as pt not convinced it helped

## 2016-08-03 NOTE — Patient Instructions (Addendum)
Ok to try off zmax and just use omnicef if flare   Continue with the flutter valve as much as possible   Please schedule a follow up visit in 3 months but call sooner if needed

## 2016-08-03 NOTE — Progress Notes (Signed)
Subjective:     Patient ID: Belinda Day, female   DOB: 12-Jan-1946    MRN: [id number]   Brief patient profile:  71  yowf quit smoking 1972 with documented right middle lobe  syndrome and evidence of bronchiectasis by CT scan in March 2002   History of Present Illness  08/08/07 FOB with classic cobblestoning and MAI on culture.   08/15/07 given Levaquin x 10 days with resolution bloody mucus, but "felt she had flu the whole time" with aches, feverish   August 29, 2007 ov: first post bronch co still coughing up mucus clear and initiate rx with symbicort/ [place]   October 19, 2007 ov no cough , sob, feeling great but no improvement on cxr   December 07, 2007 ov feeling great, minimal am cough not productive. No sob.   Opth eval, labs ok 10/2007   September 06, 2008 ov overall better over the last year, less tendency to exac on zmax and ethambutol. rec complete another year > satisfied improved 90% and stopped zmax and eth 08/2009   05/31/2014 f/u ov/Belinda Day re: bronchiectasis/chronic airflow obst/  requesting to restart maint zmax/eth Chief Complaint  Patient presents with  . Follow-up    cough still there; SOB at times   Cough worse esp in am > yellow mucus x sev tsp x months  Rare saba need on symbicort 160 2bid rec Zithromax 250 mg daily and ethambutol is 400mg  twice x 3 months  See your eye doctor as soon as possible to let him know you have started ethambutol and stop it if having any vision problems     09/03/2014 f/u ov/Belinda Day re: bronchiectasis/ airflow obst/ no better cough on zmax/etham  Chief Complaint  Patient presents with  . Follow-up    Pt states that her cough comes and goes and is occ prod with light yellow sputum.    Breathing is worse in heat / takes one hour to get mucus up each am  rec Only use your albuterol as a rescue medication  Stay on symbicort Add Try prilosec otc 20mg   Take 30-60 min before first meal of the day and Pepcid ac (famotidine)  20 mg one @  bedtime until cough is completely gone for at least a week without the need for cough suppression Add flutter valve today to you am routine   In event of a flare of nasty mucus > zithromax 250 x 2 then one daily x 4 days and stop    10/15/2014  f/u ov/Belinda Day re: bronchiectasis / maint rx = symb 160 2id  Chief Complaint  Patient presents with  . Follow-up    Feeling a little better.Cough is better but worse in am-pale yellow and early afternoon-dry. Using flutter valve two times a day.Sob with exertion-same,no wheezing.Occass. has "spasms" across abdomen and back when coughing.Needs Proair and Symbicort refills.Wants flu shot  Not limited by breathing from desired activities   rec Take pepcid 20 mg after bfast and supper/or bedtime GERD (REFLUX) diet   01/14/2015  f/u ov/Belinda Day re: obst bronchiectasis/ symb 160 2bid/ rare saba/ has flutter helps some / much better p adding h2 bid   Chief Complaint  Patient presents with  . Follow-up    pt following for obstruvtive bronchitis: pt states she is finishing up a second round of azithromycin. pt sttaes she does still have a litttle prod cough yellowidh in color, and some wheezing. no c/o of SOB or chest tightness.  Not limited by breathing from desired activities  rec No change in recommendations keep the candy handy to prevent throat clearing   Admit date: 04/17/2015 Discharge date: 04/19/2015   Discharge Diagnoses:  Principal Problem:  Influenza A Active Problems:  MAI (mycobacterium avium-intracellulare) ([place])  Essential hypertension  CAD (coronary artery disease) s/p CABG 15 yrs ago  Hypothyroidism  GERD (gastroesophageal reflux disease)  Bronchiectasis chronic Hypocalcemia      05/15/2015  Post hosp f/u ov/Belinda Day re: symb  160 2bid Chief Complaint  Patient presents with  . Follow-up    Cough has been prod with clear, foamy sputum.  She has been using albuterol 2 x daily on average.   still needing more  albuterol than baseline despite good hfa technique with symbiort 160 and confirme using it 2 bid No noct resp complaints  Doe = MMRC2 = can't walk a nl pace on a flat grade s sob but does fine slow and flat eg grocery shopping  rec Plan A = Automatic =  symbicort 160 Take 2 puffs first thing in am and then another 2 puffs about 12 hours later.  Plan B = Backup Only use your albuterol (proair) as a rescue medication  For congested/rattling cough > either mucinex or mucinex dm up to 1200 mg every 12 hours as needs but use the flutter as much as possible For dry raspy cough > benzoate 100 mg up to 4 x daily    08/14/2015  f/u ov/Belinda Day re:  obst bronchiectasis on symbicort 160 2bid  Chief Complaint  Patient presents with  . Follow-up    Cough still the same,coughed up bright red bld. on 2 occassions since last ov,lasted 1 day each-felt like sorethroat at the time,sob occass.,worse with humidity,denies cp or tightness,no fcs.Saw Belinda Day last wk. everything was good.  just two episodes hemoptysis  x one month while on asa but the total may approach 2 tbsp in 24 h rec Hold aspirin anytime you notice more blood than usual  > stopped aspirin permanently For definite change in mucus > Zpak     10/16/2015  f/u ov/Belinda Day re: obst bronchiectasis/ flare on symb 160 2bid and prn zpak Chief Complaint  Patient presents with  . Acute Visit    c/o coughing up bld.bright red 3 episodes since last ov. Took Z-pak each time. Last time finished was Sept. 14th. Not coughing up bld. now,no cough now. Off Aspirin.Shaky today.Using Albuterol 2-3 times a wk.Sob same,occass. wheezing.Denies cp or tightness, No fcs.  no   Purulent sputum just blood, chest discomfort with cough but does not lateralize Never coughed up more than a few tbsp per day Not limited by breathing from desired activities   rec Change zithromax 250 mg daily and reduce the symbicort to 80 Take 2 puffs first thing in am and then another 2 puffs  about 12 hours later.  When cough flares, cough into the flutter valve and use the mucinex up to 1200 mg every 12 hours as needed  If mucus turns nasty > omnicef 300 mg twice daily x 10 days  Please schedule a follow up office visit in 6 weeks (change the appointment)  lated add:  omnicef should be 7 days for now > called to correct     11/27/2015  f/u ov/Merrin Mcvicker re: obstructive bronchiectasis / flare since 10/28 started omnicef 11/25/15  No saba  Chief Complaint  Patient presents with  . Follow-up    Started coughing up blood again 4 days  ago- started on omnicef.   never more than a few tbsp per day / no longer on asa  Not limited by breathing from desired activities   rec No change rx    03/02/2016  f/u ov/Graclyn Lawther re:  obst bronchiectasis  Off zmax/ on symb 80 2bid  Chief Complaint  Patient presents with  . Follow-up    Cough is unchanged. She is using proair 3 x wkly on average. Last course of Omnicef was taken in mid Dec 2017.   did better on zmax with less cough / less am am cough / congestion worse in am x 2-3 tbsp thick clear mucus x sev hours but no noct cough  On mucinex 600 one twice daily max but confused with dosing and also taking Rob dm  rec Continue on Azithromycin daily .  Continue on Mucinex and Flutter valve .  Prednisone taper over next week.  VEST order sent in .  Continue on Symbicort .     05/04/2016  f/u ov/Slevin Gunby re: obst bronchiectasis on zmax 250 mg daily / symb 80 2bid and maybe twice weekly saba Chief Complaint  Patient presents with  . Follow-up    Doing well overall. She uses proair 2 x per wk as needed.   worse 1st thing in am x 3 hours maybe a cup and after supper repeats > mucus yellowish,thick not bloody /using flutter valve 4 x in 3hours with marginal benefit / already on daily zmax  rec For cough mucinex dm total is 1200 mg every 12 hours as needed    schedule VEST and use it at least 4 x daily but esp in am and when the cough starts back up in pm  and at bedtime    08/03/2016  f/u ov/Dorethia Jeanmarie re: obst bronchiectasis now on flutter  Chief Complaint  Patient presents with  . Follow-up    Pt here today stating her breathing has improved, she states she uses her smart vest 1-2 times a day, she states the increase of the mucinex has helped, she states the symbicort has helped open her up, She is still coughing sometimes it is productive,slight wheezing, Denies chest tightness   after am treatment produces pale yellow much / still using the 1200 mucinex dm bid and flutter  No obvious day to day or daytime variability or assoc excess/ purulent sputum or mucus plugs or hemoptysis or cp or chest tightness,  or overt sinus or hb symptoms. No unusual exp hx or h/o childhood pna/ asthma or knowledge of premature birth.  Sleeping great without nocturnal  or early am exacerbation  of respiratory  c/o's or need for noct saba. Also denies any obvious fluctuation of symptoms with weather or environmental changes or other aggravating or alleviating factors except as outlined above   Current Medications, Allergies, Complete Past Medical History, Past Surgical History, Family History, and Social History were reviewed in [employer] record.  ROS  The following are not active complaints unless bolded sore throat, dysphagia, dental problems, itching, sneezing,  nasal congestion or excess/ purulent secretions, ear ache,   fever, chills, sweats, unintended wt loss, classically pleuritic or exertional cp,  orthopnea pnd or leg swelling, presyncope, palpitations, abdominal pain, anorexia, nausea, vomiting, diarrhea  or change in bowel or bladder habits, change in stools or urine, dysuria,hematuria,  rash, arthralgias, visual complaints, headache, numbness, weakness or ataxia or problems with walking or coordination,  change in mood/affect or memory.  Past Medical History:  Bronchiectasis see CT SE 04/13/00  - HFA 75%  November 19, 2009  - alpha one screen 01/14/2015 >  MM  - IgE 01/14/2015 = 11  MAI  - Rx Zmax and ETH 08/29/07 > 08/2009 restarted empirically 05/31/14 > 09/03/14 (no change in cough so just use zpak for flares)  - Rx zmax maint  10/16/2015 >>> d/c 08/03/2016  - Eye eval   10[place] try cycles of [place]ipro April 02, 2010  HEALTH MAINTENANCE...........................[name] - Td 10/2007  - Pneumovax 2005   and 10/29/2011 age 66, prevnar 08/16/2013  CAD  Hyperlipidemia  Hypertension  History of cough with ACE inhibition.  Gastroesophageal reflux disease         Objective:   Physical Exam   In general she is an extremely  pleasant ambulatory white female  Vital signs reviewed  - note sats 95% on arrival RA   Wt 130 October 19, 2007>140 March 31, 2010 > 127 07/16/2010 > 10/14/2010  120 > 05/04/2011  117 > 07/30/2011  120 > 10/29/2011 118 > 130  05/01/2012 > 12/12/2012 132 >  08/14/13 137 >    02/20/2014  137 >  05/31/2014 134 > 09/03/2014    140 > 10/15/2014 137 > 01/14/2015 137 >  05/15/2015 123 >08/14/2015  124 >  10/16/2015 126 > 03/02/2016   124  > 05/04/2016  126 > 08/03/2016   130   HEENT: nl dentition, turbinates, and orophanx. Nl external ear canals without cough reflex  Neck without JVD/Nodes/TM  Lungs     insp pops squeaks both Bases R > L  with prominent sym mid   Exp rhonchi bilaterally and cough on exp  RRR no s3 or murmur or increase in P2  Abd soft and benign with nl excursion in the supine position. No bruits or organomegaly  Ext warm without calf tenderness, cyanosis clubbing or edema              Assessment:

## 2016-08-05 ENCOUNTER — Other Ambulatory Visit: Payer: Self-pay | Admitting: Family Medicine

## 2016-08-06 ENCOUNTER — Other Ambulatory Visit: Payer: Self-pay

## 2016-08-06 ENCOUNTER — Encounter: Payer: Self-pay | Admitting: Family Medicine

## 2016-08-06 MED ORDER — ALPRAZOLAM 0.25 MG PO TABS: ORAL_TABLET | ORAL | 4 refills | Status: DC

## 2016-08-06 MED ORDER — METFORMIN HCL 500 MG PO TABS: 250.0000 mg | ORAL_TABLET | Freq: Every day | ORAL | 2 refills | Status: DC

## 2016-08-06 NOTE — Progress Notes (Signed)
Subjective:   Belinda Day is a 71 y.o. female who presents for Medicare Annual (Subsequent) preventive examination.  Review of Systems:  No ROS.  Medicare Wellness Visit. Additional risk factors are reflected in the social history.  Cardiac Risk Factors include: advanced age (>31men, >89 women);hypertension;dyslipidemia   Sleep patterns: 7 hrs/night. Wakes up feeling rested.  Home Safety/Smoke Alarms: Feels safe in home. Smoke alarms in place.  Living environment; residence and Firearm Safety: Lives alone. No stairs.  Seat Belt Safety/Bike Helmet: Wears seat belt.   Counseling:   Dental- Every 6 months.  Female:   Pap-  Dr Teryl Lucy [facility]. [facility]. Pt had pap smear last year.      Mammo-     12/31/2015 Normal. Dexa scan-  12/13/2014 Osteopenia. CCS- 01/26/2013  Normal per chart. Dr Earlean Shawl. 5 year recall. Pt will see Dr Earlean Shawl next week.      Objective:     Vitals: BP 130/62 (BP Location: Left Arm, Patient Position: Sitting, Cuff Size: Normal)   Pulse 67   Resp 16   Ht 5\' 1"  (1.549 m)   Wt 132 lb 8 oz (60.1 kg)   SpO2 97%   BMI 25.04 kg/m   Body mass index is 25.04 kg/m.   Tobacco History  Smoking Status  . Former Smoker  . Packs/day: 1.00  . Years: 20.00  . Types: Cigarettes  . Quit date: 01/25/1970  Smokeless Tobacco  . Never Used     Counseling given: Not Answered   Past Medical History:  Diagnosis Date  . Bronchiectasis    oxygen at night in the past  . CAD (coronary artery disease)   . Diverticulitis 2014  . GERD (gastroesophageal reflux disease)   . History of shingles 04/2012  . HTN (hypertension)   . Hyperlipidemia   . Hypothyroidism   . MAI (mycobacterium avium-intracellulare) ([place])   . Neuritis of upper extremity    Past Surgical History:  Procedure Laterality Date  . CORONARY ARTERY BYPASS GRAFT  2004   x3 CABG   Family History  Problem Relation Age of Onset  . Colon cancer Mother   . Hyperlipidemia Mother   .  Hypertension Mother   . Heart disease Mother   . Stroke Mother   . Diabetes Mother   . Colon cancer Father   . Arthritis Father   . Hyperlipidemia Father   . Hypertension Father   . Heart disease Father   . Stroke Father   . Atopy Neg Hx    History  Sexual Activity  . Sexual activity: Not on file    Outpatient Encounter Prescriptions as of 08/09/2016  Medication Sig  . acetaminophen (TYLENOL) 650 MG CR tablet every 8 (eight) hours as needed for pain.   Marland Kitchen albuterol (PROAIR HFA) 108 (90 Base) MCG/ACT inhaler Inhale 2 puffs into the lungs every 6 (six) hours as needed for wheezing or shortness of breath.  . ALPRAZolam (XANAX) 0.25 MG tablet take 1 tablet by mouth at bedtime if needed for sleep  . amitriptyline (ELAVIL) 50 MG tablet take 1 tablet by mouth at bedtime if needed  . aspirin EC 81 MG tablet Take 81 mg by mouth every other day.  Marland Kitchen atorvastatin (LIPITOR) 80 MG tablet Take 1 tablet (80 mg total) by mouth daily.  . budesonide-formoterol (SYMBICORT) 80-4.5 MCG/ACT inhaler Take 2 puffs first thing in am and then another 2 puffs about 12 hours later.  Marland Kitchen BYSTOLIC 2.5 MG tablet  take 1 tablet by mouth once daily  . cefdinir (OMNICEF) 300 MG capsule Take 1 capsule (300 mg total) by mouth 2 (two) times daily. (Patient not taking: Reported on 03/02/2016)  . Dextromethorphan-Guaifenesin (MUCINEX DM) 30-600 MG TB12 Take 1 tablet by mouth 2 (two) times daily as needed. Patient takes 1200mg   . guaiFENesin-dextromethorphan (ROBITUSSIN DM) 100-10 MG/5ML syrup Take 5 mLs by mouth every 4 (four) hours as needed for cough.  Marland Kitchen LINZESS 290 MCG CAPS capsule   . metFORMIN (GLUCOPHAGE) 500 MG tablet Take 0.5 tablets (250 mg total) by mouth daily with breakfast.  . Respiratory Therapy Supplies (FLUTTER) DEVI Use as directed  . SYNTHROID 75 MCG tablet take 1 tablet by mouth 6 DAYS A WEEK THEN TAKE 1 AND 1/2  TABLETS ON THE 7TH DAY  . UNABLE TO FIND Smart Vest  10Hz  40% - 71/2 mins 11Hz  40%- 7 1/2 mins    . valsartan (DIOVAN) 80 MG tablet Take 1 tablet (80 mg total) by mouth daily.  . [DISCONTINUED] metFORMIN (GLUCOPHAGE) 500 MG tablet Take 250 mg by mouth daily with breakfast.   No facility-administered encounter medications on file as of 08/09/2016.     Activities of Daily Living In your present state of health, do you have any difficulty performing the following activities: 08/09/2016  Hearing? N  Vision? N  Difficulty concentrating or making decisions? N  Walking or climbing stairs? N  Dressing or bathing? N  Doing errands, shopping? N  Preparing Food and eating ? N  Using the Toilet? N  In the past six months, have you accidently leaked urine? N  Do you have problems with loss of bowel control? N  Managing your Medications? N  Managing your Finances? N  Housekeeping or managing your Housekeeping? N  Some recent data might be hidden    Patient Care Team: Marin Olp, MD as PCP - General (Family Medicine)    Assessment:    Physical assessment deferred to PCP.  Exercise Activities and Dietary recommendations Current Exercise Habits: The patient does not participate in regular exercise at present (Pt is very active during the year except for the summer due to her breathing difficulties. )   Diet (meal preparation, eat out, water intake, caffeinated beverages, dairy products, fruits and vegetables): 1 cups coffee. 1 gallon of fluid/day between water and diet green tea. Breakfast: Protein shake. Lunch:  Sandwich. Dinner: Frozen dinner.  Snacks on popcorn, pretzels, chips and salsa.   Goals    . Goal weight 125 lb.          123-125 lb range.      Fall Risk Fall Risk  08/09/2016 04/17/2015 01/30/2014  Falls in the past year? No No No   Depression Screen PHQ 2/9 Scores 08/09/2016 04/17/2015 01/30/2014  PHQ - 2 Score 0 0 0     Cognitive Function   Ad8 score reviewed for issues:  Issues making decisions:no  Less interest in hobbies / activities:no  Repeats  questions, stories (family complaining):no  Trouble using ordinary gadgets (microwave, computer, phone):no  Forgets the month or year: no  Mismanaging finances: no  Remembering appts:no  Daily problems with thinking and/or memory:no Ad8 score is=0      Immunization History  Administered Date(s) Administered  . H1N1 01/02/2008  . Influenza Split 10/14/2010, 10/07/2011  . Influenza,inj,Quad PF,36+ Mos 10/02/2012, 10/16/2015  . Influenza-Unspecified 09/25/2013, 10/15/2014  . Pneumococcal Conjugate-13 08/14/2013  . Pneumococcal Polysaccharide-23 10/29/2011   Screening Tests Health Maintenance  Topic Date  Due  . INFLUENZA VACCINE  08/25/2016  . MAMMOGRAM  12/30/2017  . COLONOSCOPY  01/26/2018  . TETANUS/TDAP  04/06/2019  . DEXA SCAN  Completed  . Hepatitis C Screening  Completed  . PNA vac Low Risk Adult  Completed      Plan:    Bring a copy of your advance directives to your next office visit.  Pt would like to have her blood work done prior to her next visit so that it can be discussed during the visit. Please advise.    EMMI programs sent.  Referral for diabetic education placed.   I have personally reviewed and noted the following in the patient's chart:   . Medical and social history . Use of alcohol, tobacco or illicit drugs  . Current medications and supplements . Functional ability and status . Nutritional status . Physical activity . Advanced directives . List of other physicians . Vitals . Screenings to include cognitive, depression, and falls . Referrals and appointments  In addition, I have reviewed and discussed with patient certain preventive protocols, quality metrics, and best practice recommendations. A written personalized care plan for preventive services as well as general preventive health recommendations were provided to patient.     Ree Edman, RN  08/09/2016

## 2016-08-06 NOTE — Progress Notes (Signed)
Pre visit review using our clinic review tool, if applicable. No additional management support is needed unless otherwise documented below in the visit note. 

## 2016-08-09 ENCOUNTER — Ambulatory Visit (INDEPENDENT_AMBULATORY_CARE_PROVIDER_SITE_OTHER): Payer: Medicare Other

## 2016-08-09 VITALS — BP 130/62 | HR 67 | Resp 16 | Ht 61.0 in | Wt 132.5 lb

## 2016-08-09 DIAGNOSIS — R739 Hyperglycemia, unspecified: Secondary | ICD-10-CM

## 2016-08-09 DIAGNOSIS — Z0000 Encounter for general adult medical examination without abnormal findings: Secondary | ICD-10-CM | POA: Diagnosis not present

## 2016-08-09 NOTE — Addendum Note (Signed)
Addended by: Stephanie Acre on: 08/09/2016 04:28 PM   Modules accepted: Orders

## 2016-08-09 NOTE — Patient Instructions (Addendum)
Bring a copy of your advance directives to your next office visit.  Tips for Eating Away From Home If You Have Diabetes Controlling your level of blood glucose, also known as blood sugar, can be challenging. It can be even more difficult when you do not prepare your own meals. The following tips can help you manage your diabetes when you eat away from home. Planning ahead Plan ahead if you know you will be eating away from home:  Ask your health care provider how to time meals and medicine if you are taking insulin.  Make a list of restaurants near you that offer healthy choices. If they have a carry-out menu, take it home and plan what you will order ahead of time.  Look up the restaurant you want to eat at online. Many chain and fast-food restaurants list nutritional information online. Use this information to choose the healthiest options and to calculate how many carbohydrates will be in your meal.  Use a carbohydrate-counting book or mobile app to look up the carbohydrate content and serving size of the foods you want to eat.  Become familiar with serving sizes and learn to recognize how many servings are in a portion. This will allow you to estimate how many carbohydrates you can eat.  Free foods A "free food" is any food or drink that has less than 5 g of carbohydrates per serving. Free foods include:  Many vegetables.  Hard boiled eggs.  Nuts or seeds.  Olives.  Cheeses.  Meats.  These types of foods make good appetizer choices and are often available at salad bars. Lemon juice, vinegar, or a low-calorie salad dressing of fewer than 20 calories per serving can be used as a "free" salad dressing. Choices to reduce carbohydrates  Substitute nonfat sweetened yogurt with a sugar-free yogurt. Yogurt made from soy milk may also be used, but you will still want a sugar-free or plain option to choose a lower carbohydrate amount.  Ask your server to take away the bread basket or  chips from your table.  Order fresh fruit. A salad bar often offers fresh fruit choices. Avoid canned fruit because it is usually packed in sugar or syrup.  Order a salad, and eat it without dressing. Or, create a "free" salad dressing.  Ask for substitutions. For example, instead of [place] fries, request an order of a vegetable such as salad, green beans, or broccoli. Other tips  If you take insulin, take the insulin once your food arrives to your table. This will ensure your insulin and food are timed correctly.  Ask your server about the portion size before your order, and ask for a take-out box if the portion has more servings than you should have. When your food comes, leave the amount you should have on the plate, and put the rest in the take-out box.  Consider splitting an entree with someone and ordering a side salad. This information is not intended to replace advice given to you by your health care provider. Make sure you discuss any questions you have with your health care provider. Document Released: 01/11/2005 Document Revised: 06/19/2015 Document Reviewed: 04/10/2013 Elsevier Interactive Patient Education  2018 [employer]. Diabetes Mellitus and Food It is important for you to manage your blood sugar (glucose) level. Your blood glucose level can be greatly affected by what you eat. Eating healthier foods in the appropriate amounts throughout the day at about the same time each day will help you control your blood glucose  level. It can also help slow or prevent worsening of your diabetes mellitus. Healthy eating may even help you improve the level of your blood pressure and reach or maintain a healthy weight. General recommendations for healthful eating and cooking habits include:  Eating meals and snacks regularly. Avoid going long periods of time without eating to lose weight.  Eating a diet that consists mainly of plant-based foods, such as fruits, vegetables, nuts, legumes,  and whole grains.  Using low-heat cooking methods, such as baking, instead of high-heat cooking methods, such as deep frying.  Work with your dietitian to make sure you understand how to use the Nutrition Facts information on food labels. How can food affect me? Carbohydrates Carbohydrates affect your blood glucose level more than any other type of food. Your dietitian will help you determine how many carbohydrates to eat at each meal and teach you how to count carbohydrates. Counting carbohydrates is important to keep your blood glucose at a healthy level, especially if you are using insulin or taking certain medicines for diabetes mellitus. Alcohol Alcohol can cause sudden decreases in blood glucose (hypoglycemia), especially if you use insulin or take certain medicines for diabetes mellitus. Hypoglycemia can be a life-threatening condition. Symptoms of hypoglycemia (sleepiness, dizziness, and disorientation) are similar to symptoms of having too much alcohol. If your health care provider has given you approval to drink alcohol, do so in moderation and use the following guidelines:  Women should not have more than one drink per day, and men should not have more than two drinks per day. One drink is equal to: ? 12 oz of beer. ? 5 oz of wine. ? 1 oz of hard liquor.  Do not drink on an empty stomach.  Keep yourself hydrated. Have water, diet soda, or unsweetened iced tea.  Regular soda, juice, and other mixers might contain a lot of carbohydrates and should be counted.  What foods are not recommended? As you make food choices, it is important to remember that all foods are not the same. Some foods have fewer nutrients per serving than other foods, even though they might have the same number of calories or carbohydrates. It is difficult to get your body what it needs when you eat foods with fewer nutrients. Examples of foods that you should avoid that are high in calories and carbohydrates but  low in nutrients include:  Trans fats (most processed foods list trans fats on the Nutrition Facts label).  Regular soda.  Juice.  Candy.  Sweets, such as cake, pie, doughnuts, and cookies.  Fried foods.  What foods can I eat? Eat nutrient-rich foods, which will nourish your body and keep you healthy. The food you should eat also will depend on several factors, including:  The calories you need.  The medicines you take.  Your weight.  Your blood glucose level.  Your blood pressure level.  Your cholesterol level.  You should eat a variety of foods, including:  Protein. ? Lean cuts of meat. ? Proteins low in saturated fats, such as fish, egg whites, and beans. Avoid processed meats.  Fruits and vegetables. ? Fruits and vegetables that may help control blood glucose levels, such as apples, mangoes, and yams.  Dairy products. ? Choose fat-free or low-fat dairy products, such as milk, yogurt, and cheese.  Grains, bread, pasta, and rice. ? Choose whole grain products, such as multigrain bread, whole oats, and brown rice. These foods may help control blood pressure.  Fats. ? Foods containing  healthful fats, such as nuts, avocado, olive oil, canola oil, and fish.  Does everyone with diabetes mellitus have the same meal plan? Because every person with diabetes mellitus is different, there is not one meal plan that works for everyone. It is very important that you meet with a dietitian who will help you create a meal plan that is just right for you. This information is not intended to replace advice given to you by your health care provider. Make sure you discuss any questions you have with your health care provider. Document Released: 10/08/2004 Document Revised: 06/19/2015 Document Reviewed: 12/08/2012 Elsevier Interactive Patient Education  2017 [place] for Diabetes Mellitus, Adult Carbohydrate counting is a method for keeping track of how  many carbohydrates you eat. Eating carbohydrates naturally increases the amount of sugar (glucose) in the blood. Counting how many carbohydrates you eat helps keep your blood glucose within normal limits, which helps you manage your diabetes (diabetes mellitus). It is important to know how many carbohydrates you can safely have in each meal. This is different for every person. A diet and nutrition specialist (registered dietitian) can help you make a meal plan and calculate how many carbohydrates you should have at each meal and snack. Carbohydrates are found in the following foods:  Grains, such as breads and cereals.  Dried beans and soy products.  Starchy vegetables, such as potatoes, peas, and corn.  Fruit and fruit juices.  Milk and yogurt.  Sweets and snack foods, such as cake, cookies, candy, chips, and soft drinks.  How do I count carbohydrates? There are two ways to count carbohydrates in food. You can use either of the methods or a combination of both. Reading "Nutrition Facts" on packaged food The "Nutrition Facts" list is included on the labels of almost all packaged foods and beverages in the [place] It includes:  The serving size.  Information about nutrients in each serving, including the grams (g) of carbohydrate per serving.  To use the "Nutrition Facts":  Decide how many servings you will have.  Multiply the number of servings by the number of carbohydrates per serving.  The resulting number is the total amount of carbohydrates that you will be having.  Learning standard serving sizes of other foods When you eat foods containing carbohydrates that are not packaged or do not include "Nutrition Facts" on the label, you need to measure the servings in order to count the amount of carbohydrates:  Measure the foods that you will eat with a food scale or measuring cup, if needed.  Decide how many standard-size servings you will eat.  Multiply the number of servings by  15. Most carbohydrate-rich foods have about 15 g of carbohydrates per serving. ? For example, if you eat 8 oz (170 g) of strawberries, you will have eaten 2 servings and 30 g of carbohydrates (2 servings x 15 g = 30 g).  For foods that have more than one food mixed, such as soups and casseroles, you must count the carbohydrates in each food that is included.  The following list contains standard serving sizes of common carbohydrate-rich foods. Each of these servings has about 15 g of carbohydrates:   hamburger bun or  English muffin.   oz (15 mL) syrup.   oz (14 g) jelly.  1 slice of bread.  1 six-inch tortilla.  3 oz (85 g) cooked rice or pasta.  4 oz (113 g) cooked dried beans.  4 oz (113 g) starchy vegetable,  such as peas, corn, or potatoes.  4 oz (113 g) hot cereal.  4 oz (113 g) mashed potatoes or  of a large baked potato.  4 oz (113 g) canned or frozen fruit.  4 oz (120 mL) fruit juice.  4-6 crackers.  6 chicken nuggets.  6 oz (170 g) unsweetened dry cereal.  6 oz (170 g) plain fat-free yogurt or yogurt sweetened with artificial sweeteners.  8 oz (240 mL) milk.  8 oz (170 g) fresh fruit or one small piece of fruit.  24 oz (680 g) popped popcorn.  Example of carbohydrate counting Sample meal  3 oz (85 g) chicken breast.  6 oz (170 g) brown rice.  4 oz (113 g) corn.  8 oz (240 mL) milk.  8 oz (170 g) strawberries with sugar-free whipped topping. Carbohydrate calculation 1. Identify the foods that contain carbohydrates: ? Rice. ? Corn. ? Milk. ? Strawberries. 2. Calculate how many servings you have of each food: ? 2 servings rice. ? 1 serving corn. ? 1 serving milk. ? 1 serving strawberries. 3. Multiply each number of servings by 15 g: ? 2 servings rice x 15 g = 30 g. ? 1 serving corn x 15 g = 15 g. ? 1 serving milk x 15 g = 15 g. ? 1 serving strawberries x 15 g = 15 g. 4. Add together all of the amounts to find the total grams of  carbohydrates eaten: ? 30 g + 15 g + 15 g + 15 g = 75 g of carbohydrates total. This information is not intended to replace advice given to you by your health care provider. Make sure you discuss any questions you have with your health care provider. Document Released: 01/11/2005 Document Revised: 08/01/2015 Document Reviewed: 06/25/2015 Elsevier Interactive Patient Education  2018 [place] 65 Years and Older, Female Preventive care refers to lifestyle choices and visits with your health care provider that can promote health and wellness. What does preventive care include?  A yearly physical exam. This is also called an annual well check.  Dental exams once or twice a year.  Routine eye exams. Ask your health care provider how often you should have your eyes checked.  Personal lifestyle choices, including: ? Daily care of your teeth and gums. ? Regular physical activity. ? Eating a healthy diet. ? Avoiding tobacco and drug use. ? Limiting alcohol use. ? Practicing safe sex. ? Taking low-dose aspirin every day. ? Taking vitamin and mineral supplements as recommended by your health care provider. What happens during an annual well check? The services and screenings done by your health care provider during your annual well check will depend on your age, overall health, lifestyle risk factors, and family history of disease. Counseling Your health care provider may ask you questions about your:  Alcohol use.  Tobacco use.  Drug use.  Emotional well-being.  Home and relationship well-being.  Sexual activity.  Eating habits.  History of falls.  Memory and ability to understand (cognition).  Work and work Statistician.  Reproductive health.  Screening You may have the following tests or measurements:  Height, weight, and BMI.  Blood pressure.  Lipid and cholesterol levels. These may be checked every 5 years, or more frequently if you are over  98 years old.  Skin check.  Lung cancer screening. You may have this screening every year starting at age 15 if you have a 30-pack-year history of smoking and currently smoke or have  quit within the past 15 years.  Fecal occult blood test (FOBT) of the stool. You may have this test every year starting at age 30.  Flexible sigmoidoscopy or colonoscopy. You may have a sigmoidoscopy every 5 years or a colonoscopy every 10 years starting at age 74.  Hepatitis C blood test.  Hepatitis B blood test.  Sexually transmitted disease (STD) testing.  Diabetes screening. This is done by checking your blood sugar (glucose) after you have not eaten for a while (fasting). You may have this done every 1-3 years.  Bone density scan. This is done to screen for osteoporosis. You may have this done starting at age 56.  Mammogram. This may be done every 1-2 years. Talk to your health care provider about how often you should have regular mammograms.  Talk with your health care provider about your test results, treatment options, and if necessary, the need for more tests. Vaccines Your health care provider may recommend certain vaccines, such as:  Influenza vaccine. This is recommended every year.  Tetanus, diphtheria, and acellular pertussis (Tdap, Td) vaccine. You may need a Td booster every 10 years.  Varicella vaccine. You may need this if you have not been vaccinated.  Zoster vaccine. You may need this after age 26.  Measles, mumps, and rubella (MMR) vaccine. You may need at least one dose of MMR if you were born in 1957 or later. You may also need a second dose.  Pneumococcal 13-valent conjugate (PCV13) vaccine. One dose is recommended after age 3.  Pneumococcal polysaccharide (PPSV23) vaccine. One dose is recommended after age 7.  Meningococcal vaccine. You may need this if you have certain conditions.  Hepatitis A vaccine. You may need this if you have certain conditions or if you travel  or work in places where you may be exposed to hepatitis A.  Hepatitis B vaccine. You may need this if you have certain conditions or if you travel or work in places where you may be exposed to hepatitis B.  Haemophilus influenzae type b (Hib) vaccine. You may need this if you have certain conditions.  Talk to your health care provider about which screenings and vaccines you need and how often you need them. This information is not intended to replace advice given to you by your health care provider. Make sure you discuss any questions you have with your health care provider. Document Released: 02/07/2015 Document Revised: 10/01/2015 Document Reviewed: 11/12/2014 Elsevier Interactive Patient Education  2017 [employer].

## 2016-08-09 NOTE — Progress Notes (Signed)
I have reviewed and agree with note, evaluation, plan.   Roselyn Reef- can you please order a future bmet and a1c under hyperglycemia for patient before her next visit with me and help her set up labs?  Garret Reddish, MD

## 2016-08-16 ENCOUNTER — Encounter: Payer: Self-pay | Admitting: Cardiology

## 2016-08-16 ENCOUNTER — Ambulatory Visit: Payer: Medicare Other

## 2016-08-17 ENCOUNTER — Telehealth: Payer: Self-pay

## 2016-08-17 ENCOUNTER — Other Ambulatory Visit: Payer: Self-pay

## 2016-08-17 DIAGNOSIS — R739 Hyperglycemia, unspecified: Secondary | ICD-10-CM

## 2016-08-17 NOTE — Telephone Encounter (Signed)
Called patient and left a voicemail message asking for a return phone call to get her scheduled for labs next week prior to her scheduled visit with Dr. Yong Channel on August 30, 2016. I entered in her lab orders.

## 2016-08-17 NOTE — Telephone Encounter (Signed)
-----   Message from Marin Olp, MD sent at 08/09/2016  7:53 PM EDT -----   ----- Message ----- From: Stephanie Acre, RN Sent: 08/09/2016   4:27 PM To: Marin Olp, MD

## 2016-08-18 ENCOUNTER — Telehealth: Payer: Self-pay | Admitting: Family Medicine

## 2016-08-18 ENCOUNTER — Telehealth: Payer: Self-pay

## 2016-08-18 MED ORDER — IRBESARTAN 75 MG PO TABS: 75.0000 mg | ORAL_TABLET | Freq: Every day | ORAL | 1 refills | Status: DC

## 2016-08-18 NOTE — Telephone Encounter (Signed)
Belinda Day pt returned your call. °

## 2016-08-18 NOTE — Telephone Encounter (Signed)
-----   Message from Marin Olp, MD sent at 08/09/2016  7:53 PM EDT -----   ----- Message ----- From: Stephanie Acre, RN Sent: 08/09/2016   4:27 PM To: Marin Olp, MD

## 2016-08-18 NOTE — Telephone Encounter (Signed)
Spoke with patient and I got her scheduled for lab on 08/23/16

## 2016-08-18 NOTE — Telephone Encounter (Signed)
Called patient and spoke to her. I got her scheduled for a lab appointment on 08/23/16

## 2016-08-19 DIAGNOSIS — J449 Chronic obstructive pulmonary disease, unspecified: Secondary | ICD-10-CM | POA: Diagnosis not present

## 2016-08-19 DIAGNOSIS — J471 Bronchiectasis with (acute) exacerbation: Secondary | ICD-10-CM | POA: Diagnosis not present

## 2016-08-19 DIAGNOSIS — J4550 Severe persistent asthma, uncomplicated: Secondary | ICD-10-CM | POA: Diagnosis not present

## 2016-08-22 IMAGING — CT CT ANGIO CHEST
2 of 6 series · 18 of 36 positions shown · IV contrast (omnipaque)
Comparison: Chest radiographs 04/17/2015 and CT 08/03/2007

CLINICAL DATA: Shortness of breath. Cold and congestion for 3 days.

EXAM:
CT ANGIOGRAPHY CHEST WITH CONTRAST
TECHNIQUE: Multidetector CT imaging of the chest was performed using the
standard protocol during bolus administration of intravenous
contrast. Multiplanar CT image reconstructions and MIPs were
obtained to evaluate the vascular anatomy.
CONTRAST:  100mL OMNIPAQUE IOHEXOL 350 MG/ML SOLN

[Series 6: thins for pacs · axial · 0.60mm/px · z∈[+1208,+1409]mm · 17 of 225 slices shown]
[im 12/225  lung]
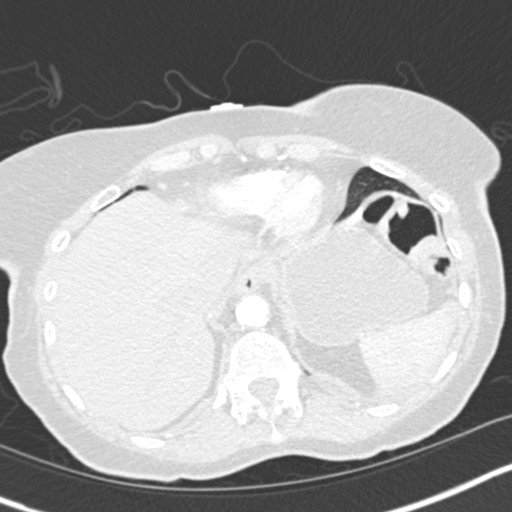
[im 23/225  mediastinal]
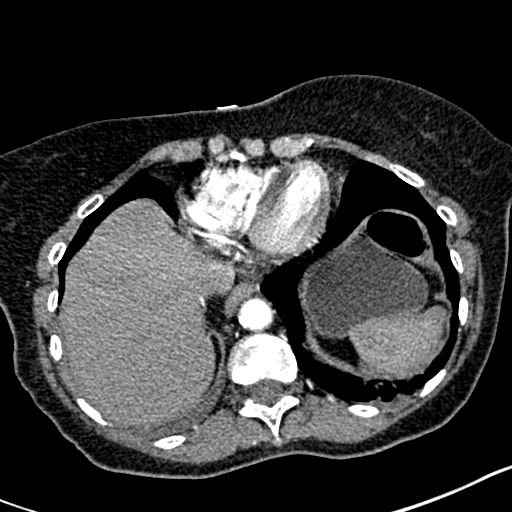
[im 34/225  lung]
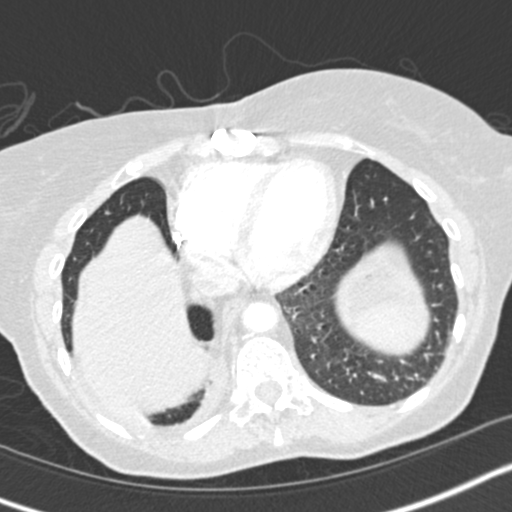
[im 45/225  mediastinal]
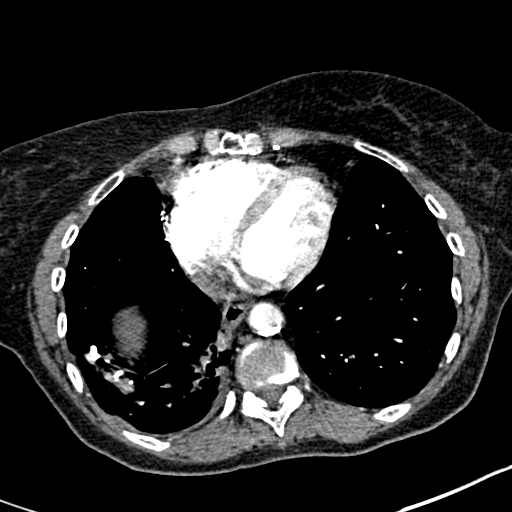
[im 68/225  lung]
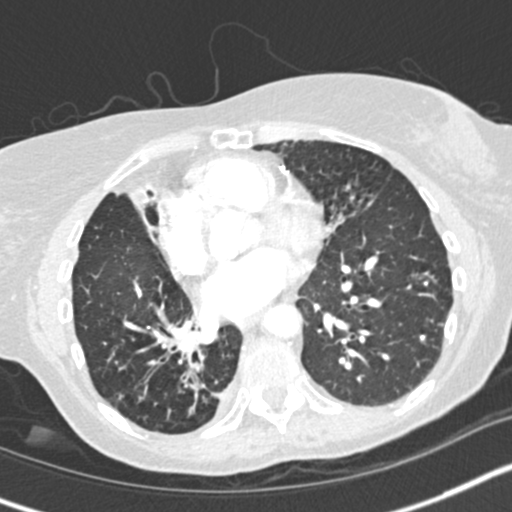
[im 79/225  mediastinal]
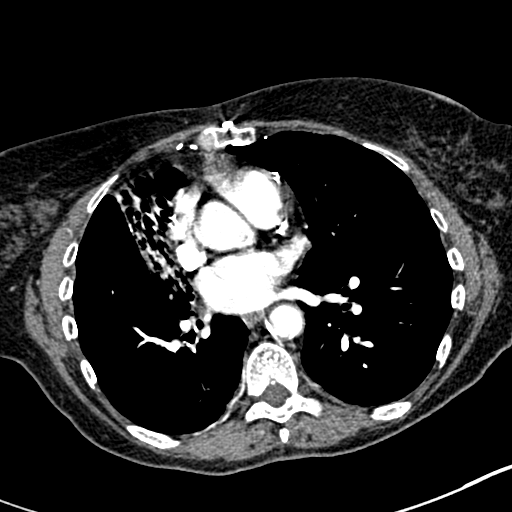
[im 90/225  lung]
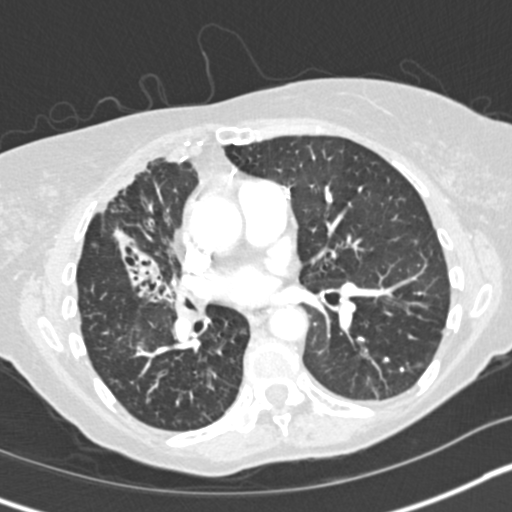
[im 101/225  mediastinal]
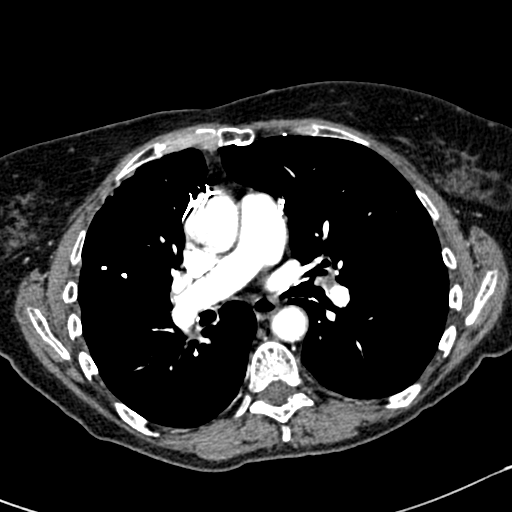
[im 113/225  lung]
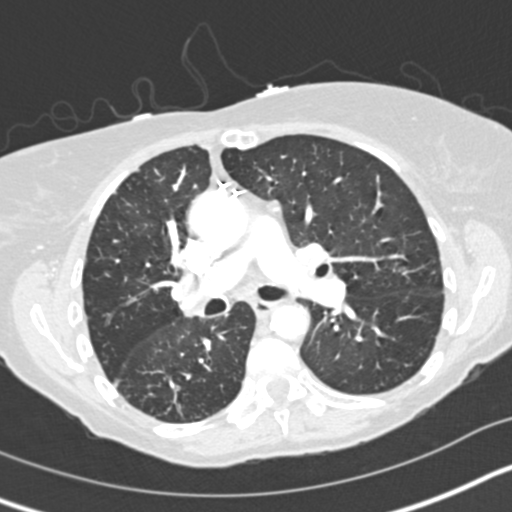
[im 124/225  mediastinal]
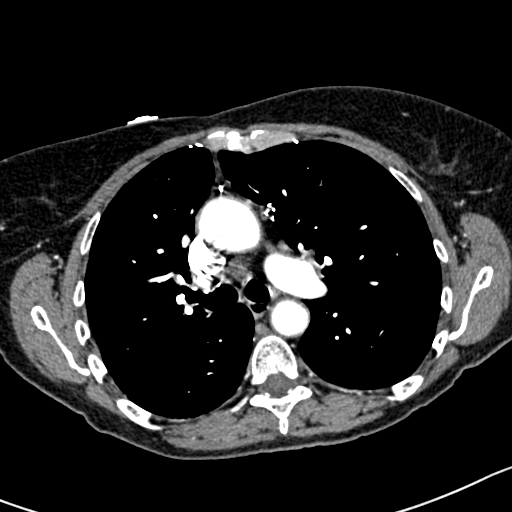
[im 135/225  lung]
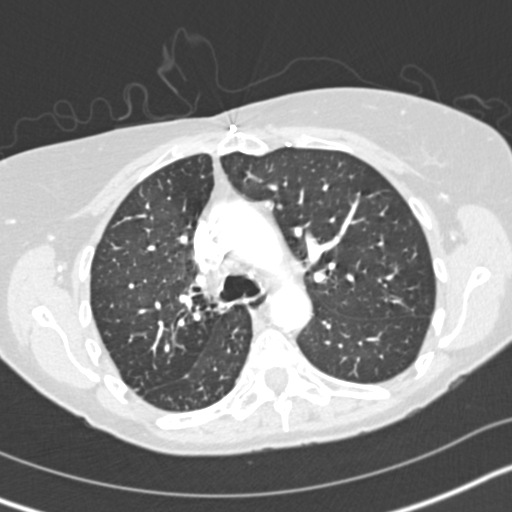
[im 146/225  mediastinal]
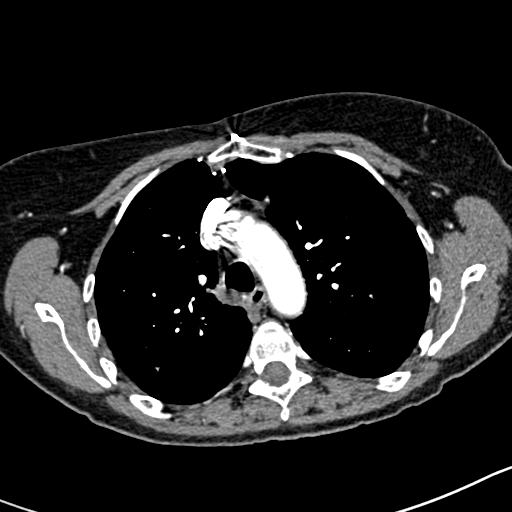
[im 157/225  lung]
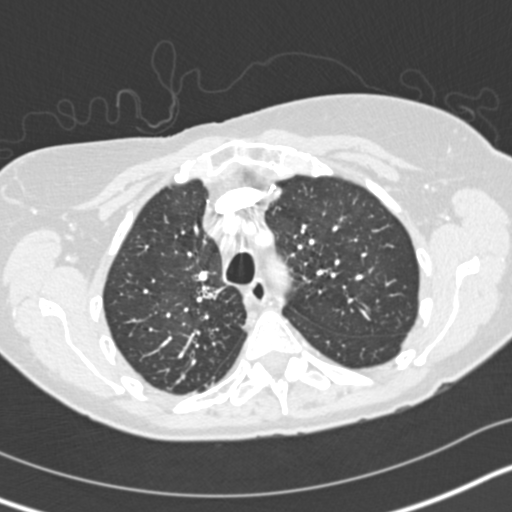
[im 180/225  mediastinal]
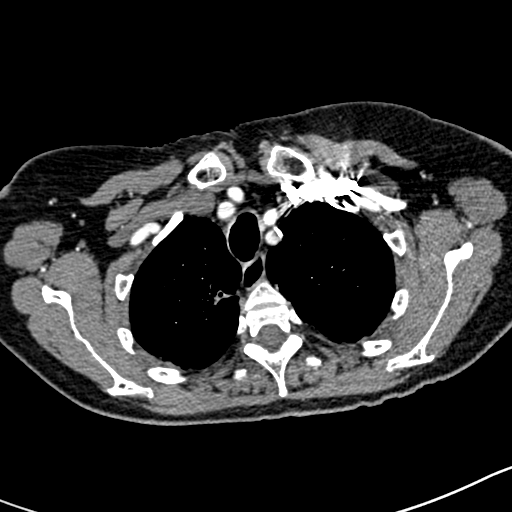
[im 191/225  lung]
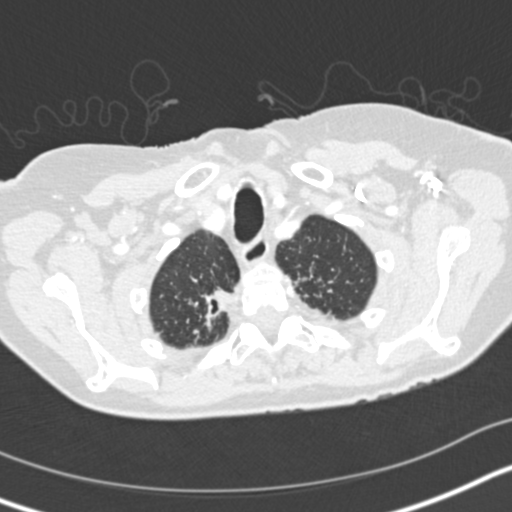
[im 202/225  mediastinal]
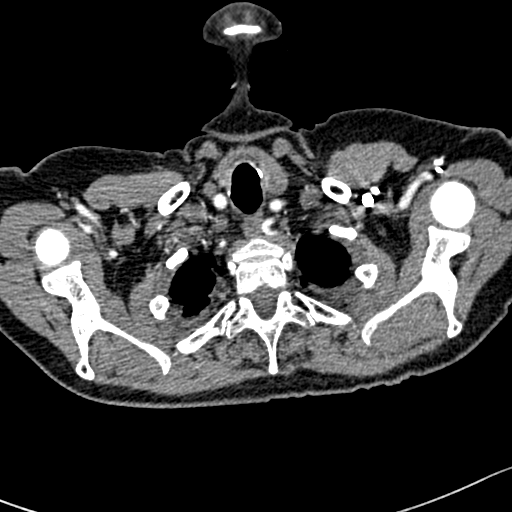
[im 213/225  lung]
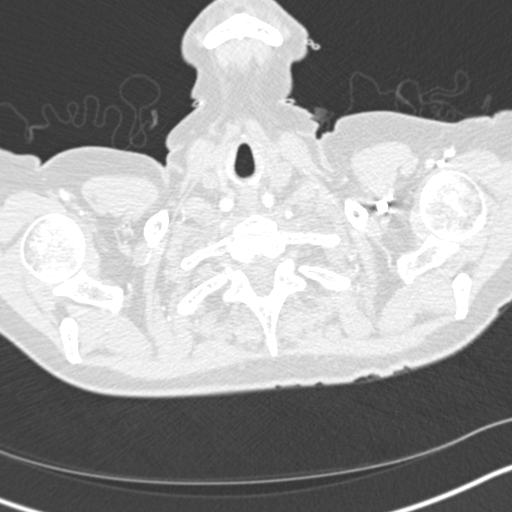

[Series 8: coronal mpr · coronal · 0.44mm/px · 1 of 113 slices shown]
[im 57/113  mediastinal]
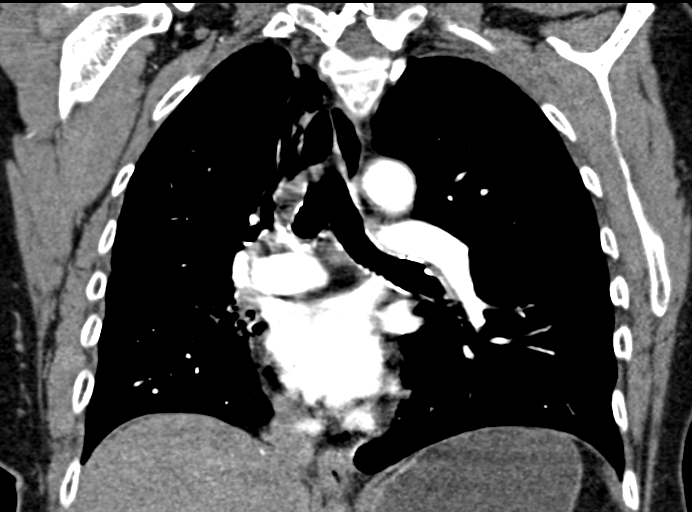

[18 of 36 positions shown; findings below may reference images not displayed]

FINDINGS: There is no evidence of pulmonary arterial emboli. The right middle
lobe pulmonary artery is not well seen and may be small due to
chronic parenchymal middle lobe lung disease. Sequelae of prior CABG
are identified. Heart is within normal limits for size. Aortic and
coronary artery calcification is noted. A right paratracheal node is
minimally larger than on the prior CT, measuring 10 mm in short axis
(previously 9 mm). No enlarged hilar lymph nodes are identified.
There is a trace right pleural effusion.

Bronchial wall thickening and bronchiectasis are again seen
involving the right greater than left lungs. The cavitary right
upper lobe nodule on the prior CT has decreased in size, now
measuring 1.7 x 1.5 cm and likely reflecting scarring associated
with bronchiectasis. Extensive bronchiectasis and volume loss
throughout the right middle lobe are similar to the prior CT.

There is a 1.3 cm nodular opacity in the right lower lobe at the
site of the prior 3.8 cm cavitary nodule, likely reflecting residual
scarring or focal atelectasis. Right lower lobe bronchiectasis is
similar to the prior CT. Tiny nodular densities throughout the right
lower and right upper lobes are again seen, some of which are
tree-in-bud in configuration. Mild atelectasis is present in the
basilar right lower lobe, and there is also mild chronic lingular
volume loss. Minimal basilar left lower lobe atelectasis is noted.
Scattered calcifications are again seen throughout both lungs.

No acute abnormality is identified in the visualized upper portion
of the abdomen. No acute osseous abnormality is identified.

Review of the MIP images confirms the above findings.
IMPRESSION: 1. No evidence of acute pulmonary emboli.
2. Trace right pleural effusion.
3. Marked chronic bronchiectasis and volume loss in the right middle
lobe with milder bronchiectasis, bronchial wall thickening, and
nodular densities throughout the right upper and right lower lobe
suggestive of chronic endobronchial/atypical mycobacterial
infection.

## 2016-08-23 ENCOUNTER — Other Ambulatory Visit (INDEPENDENT_AMBULATORY_CARE_PROVIDER_SITE_OTHER): Payer: Medicare Other

## 2016-08-23 DIAGNOSIS — E785 Hyperlipidemia, unspecified: Secondary | ICD-10-CM | POA: Diagnosis not present

## 2016-08-23 DIAGNOSIS — E039 Hypothyroidism, unspecified: Secondary | ICD-10-CM | POA: Diagnosis not present

## 2016-08-23 DIAGNOSIS — R739 Hyperglycemia, unspecified: Secondary | ICD-10-CM

## 2016-08-23 LAB — CBC WITH DIFFERENTIAL/PLATELET
BASOS ABS: 0 10*3/uL (ref 0.0–0.1)
BASOS PCT: 0.7 % (ref 0.0–3.0)
EOS PCT: 1.5 % (ref 0.0–5.0)
Eosinophils Absolute: 0.1 10*3/uL (ref 0.0–0.7)
HCT: 41.4 % (ref 36.0–46.0)
Hemoglobin: 13.7 g/dL (ref 12.0–15.0)
LYMPHS ABS: 1.5 10*3/uL (ref 0.7–4.0)
Lymphocytes Relative: 32 % (ref 12.0–46.0)
MCHC: 33.1 g/dL (ref 30.0–36.0)
MCV: 95.6 fl (ref 78.0–100.0)
MONOS PCT: 8.7 % (ref 3.0–12.0)
Monocytes Absolute: 0.4 10*3/uL (ref 0.1–1.0)
NEUTROS ABS: 2.6 10*3/uL (ref 1.4–7.7)
NEUTROS PCT: 57.1 % (ref 43.0–77.0)
PLATELETS: 267 10*3/uL (ref 150.0–400.0)
RBC: 4.33 Mil/uL (ref 3.87–5.11)
RDW: 12.8 % (ref 11.5–15.5)
WBC: 4.5 10*3/uL (ref 4.0–10.5)

## 2016-08-23 LAB — BASIC METABOLIC PANEL
BUN: 20 mg/dL (ref 6–23)
CALCIUM: 8.9 mg/dL (ref 8.4–10.5)
CHLORIDE: 105 meq/L (ref 96–112)
CO2: 28 meq/L (ref 19–32)
Creatinine, Ser: 0.8 mg/dL (ref 0.40–1.20)
GFR: 74.64 mL/min (ref >60.00)
Glucose, Bld: 90 mg/dL (ref 70–99)
Potassium: 4.3 mEq/L (ref 3.5–5.1)
SODIUM: 140 meq/L (ref 135–145)

## 2016-08-23 LAB — HEPATIC FUNCTION PANEL
ALBUMIN: 3.9 g/dL (ref 3.5–5.2)
ALT: 15 U/L (ref 0–35)
AST: 21 U/L (ref 0–37)
Alkaline Phosphatase: 92 U/L (ref 39–117)
Bilirubin, Direct: 0.1 mg/dL (ref 0.0–0.3)
TOTAL PROTEIN: 6.3 g/dL (ref 6.0–8.3)
Total Bilirubin: 0.4 mg/dL (ref 0.2–1.2)

## 2016-08-23 LAB — LIPID PANEL
CHOLESTEROL: 129 mg/dL (ref 0–200)
HDL: 58.4 mg/dL (ref >39.00)
LDL Cholesterol: 53 mg/dL (ref 0–99)
NonHDL: 70.43
Total CHOL/HDL Ratio: 2
Triglycerides: 89 mg/dL (ref 0.0–149.0)
VLDL: 17.8 mg/dL (ref 0.0–40.0)

## 2016-08-23 LAB — HEMOGLOBIN A1C: Hgb A1c MFr Bld: 6.3 % (ref 4.6–6.5)

## 2016-08-23 LAB — TSH: TSH: 0.69 u[IU]/mL (ref 0.35–4.50)

## 2016-08-26 ENCOUNTER — Telehealth: Payer: Self-pay

## 2016-08-26 NOTE — Telephone Encounter (Signed)
See phone notes date 08/17/16 & 08/18/16. Patient was scheduled for lab on 08/23/16

## 2016-08-26 NOTE — Telephone Encounter (Signed)
-----   Message from Marin Olp, MD sent at 08/26/2016 11:48 AM EDT ----- From awv visit- didn't see any notes on it from you. I Can see I sent it to you but not any response  "Roselyn Reef- can you please order a future bmet and a1c under hyperglycemia for patient before her next visit with me and help her set up labs?"

## 2016-08-30 ENCOUNTER — Ambulatory Visit (INDEPENDENT_AMBULATORY_CARE_PROVIDER_SITE_OTHER): Payer: Medicare Other | Admitting: Family Medicine

## 2016-08-30 ENCOUNTER — Encounter: Payer: Self-pay | Admitting: Family Medicine

## 2016-08-30 DIAGNOSIS — I10 Essential (primary) hypertension: Secondary | ICD-10-CM

## 2016-08-30 DIAGNOSIS — E785 Hyperlipidemia, unspecified: Secondary | ICD-10-CM | POA: Diagnosis not present

## 2016-08-30 DIAGNOSIS — R739 Hyperglycemia, unspecified: Secondary | ICD-10-CM

## 2016-08-30 DIAGNOSIS — E039 Hypothyroidism, unspecified: Secondary | ICD-10-CM | POA: Diagnosis not present

## 2016-08-30 MED ORDER — METFORMIN HCL 500 MG PO TABS: 250.0000 mg | ORAL_TABLET | Freq: Two times a day (BID) | ORAL | 3 refills | Status: DC

## 2016-08-30 MED ORDER — AMITRIPTYLINE HCL 50 MG PO TABS: ORAL_TABLET | ORAL | 3 refills | Status: DC

## 2016-08-30 NOTE — Progress Notes (Signed)
Subjective:  Belinda Day is a 71 y.o. year old very pleasant female patient who presents for/with See problem oriented charting ROS- No chest pain except after prolonged coughing fit. Some coughing fits at times.  No headache or blurry vision.    Past Medical History-  Patient Active Problem List   Diagnosis Date Noted  . CAD (coronary artery disease) s/p CABG 07/03/2007    Priority: High  . Hyperglycemia 08/05/2014    Priority: Medium  . Insomnia 02/12/2013    Priority: Medium  . Nocturnal hypoxemia 12/12/2012    Priority: Medium  . Fibromyalgia 12/28/2011    Priority: Medium  . Hypothyroidism 04/06/2010    Priority: Medium  . MAI (mycobacterium avium-intracellulare) ([place]) 11/19/2009    Priority: Medium  . Hyperlipemia 07/03/2007    Priority: Medium  . Essential hypertension 07/03/2007    Priority: Medium  . Obstructive bronchiectasis ([place]) 07/03/2007    Priority: Medium  . Former smoker 08/05/2014    Priority: Low  . Constipation 05/02/2014    Priority: Low  . History of colonic polyps 05/02/2014    Priority: Low  . GERD (gastroesophageal reflux disease) 02/24/2014    Priority: Low  . Hemoptysis 02/24/2014    Priority: Low  . Benign paroxysmal positional vertigo 04/18/2013    Priority: Low  . Diverticulitis 12/21/2012    Priority: Low  . Osteopenia 08/14/2011    Priority: Low  . Cerebrovascular disease 01/31/2015    Medications- reviewed and updated Current Outpatient Prescriptions  Medication Sig Dispense Refill  . acetaminophen (TYLENOL) 650 MG CR tablet every 8 (eight) hours as needed for pain.     Marland Kitchen albuterol (PROAIR HFA) 108 (90 Base) MCG/ACT inhaler Inhale 2 puffs into the lungs every 6 (six) hours as needed for wheezing or shortness of breath. 1 Inhaler 11  . ALPRAZolam (XANAX) 0.25 MG tablet take 1 tablet by mouth at bedtime if needed for sleep 60 tablet 4  . amitriptyline (ELAVIL) 50 MG tablet take 1 tablet by mouth at bedtime if needed 90 tablet 1   . aspirin EC 81 MG tablet Take 81 mg by mouth every other day.    Marland Kitchen atorvastatin (LIPITOR) 80 MG tablet Take 1 tablet (80 mg total) by mouth daily. 90 tablet 3  . budesonide-formoterol (SYMBICORT) 80-4.5 MCG/ACT inhaler Take 2 puffs first thing in am and then another 2 puffs about 12 hours later. 1 Inhaler 12  . BYSTOLIC 2.5 MG tablet take 1 tablet by mouth once daily 90 tablet 3  . cefdinir (OMNICEF) 300 MG capsule Take 1 capsule (300 mg total) by mouth 2 (two) times daily. 14 capsule 11  . Dextromethorphan-Guaifenesin (MUCINEX DM) 30-600 MG TB12 Take 1 tablet by mouth 2 (two) times daily as needed. Patient takes 1200mg     . irbesartan (AVAPRO) 75 MG tablet Take 1 tablet (75 mg total) by mouth daily. 90 tablet 1  . LINZESS 290 MCG CAPS capsule   0  . metFORMIN (GLUCOPHAGE) 500 MG tablet Take 0.5 tablets (250 mg total) by mouth daily with breakfast. 90 tablet 2  . Respiratory Therapy Supplies (FLUTTER) DEVI Use as directed 1 each 0  . SYNTHROID 75 MCG tablet take 1 tablet by mouth 6 DAYS A WEEK THEN TAKE 1 AND 1/2  TABLETS ON THE 7TH DAY 95 tablet 3  . UNABLE TO FIND Smart Vest  10Hz  40% - 71/2 mins 11Hz  40%- 7 1/2 mins     No current facility-administered medications for this visit.  Objective: BP 138/72 (BP Location: Left Arm, Patient Position: Sitting, Cuff Size: Large)   Pulse 76   Temp 99 F (37.2 C) (Oral)   Ht 5\' 1"  (1.549 m)   Wt 127 lb 6.4 oz (57.8 kg)   SpO2 96%   BMI 24.07 kg/m  Gen: NAD, resting comfortably CV: RRR no murmurs rubs or gallops Lungs: no crackles, occasional diffuse wheeze, rhonchi Abdomen: soft/nontender/nondistended/normal bowel sounds. Normal weight Ext: no edema Skin: warm, dry  Assessment/Plan:  Essential hypertension S: controlled on . bystolic 2.5mg  and valsartan 80mg , now changed to irbesartan 75mg   BP Readings from Last 3 Encounters:  08/30/16 138/72  08/09/16 130/62  08/03/16 110/74  A/P: We discussed blood pressure goal of <140/90.  Continue current meds.   Hypothyroidism S: controlled Lab Results  Component Value Date   TSH 0.69 08/23/2016   On thyroid medication-synthroid 75 mcg- 1 tablet 6 days a week then 1.5 one day a week A/P: continue current meds   Hyperlipemia S: well controlled on atorvastatin 80mg  with LDL 53. No myalgias.  Lab Results  Component Value Date   CHOL 129 08/23/2016   HDL 58.40 08/23/2016   LDLCALC 53 08/23/2016   TRIG 89.0 08/23/2016   CHOLHDL 2 08/23/2016   A/P: no change- excellent control  Hyperglycemia S:  a1c up some. Weight up 3 lbs from last visit but she is normal weight. On metformin 250mg  in AM. On full pill got shaky in AM and some blurred vision- ? Low blood sugar Lab Results  Component Value Date   HGBA1C 6.3 08/23/2016  A/P: trial 250mg  of metformin twice a day with meals. She will let us know if any side effects like last time   02/15/17 or later for CPE  Meds ordered this encounter  Medications  . metFORMIN (GLUCOPHAGE) 500 MG tablet    Sig: Take 0.5 tablets (250 mg total) by mouth 2 (two) times daily with a meal.    Dispense:  90 tablet    Refill:  3  . amitriptyline (ELAVIL) 50 MG tablet    Sig: take 1 tablet by mouth at bedtime    Dispense:  90 tablet    Refill:  3    Return precautions advised.  Garret Reddish, MD

## 2016-08-30 NOTE — Assessment & Plan Note (Signed)
S: controlled Lab Results  Component Value Date   TSH 0.69 08/23/2016   On thyroid medication-synthroid 75 mcg- 1 tablet 6 days a week then 1.5 one day a week A/P: continue current meds

## 2016-08-30 NOTE — Patient Instructions (Addendum)
02/15/17 or later for physical  No changes today outside of trying metformin 1/2 tablet twice a day. Let me know if any issues like you had with the 500mg  metfomrin.

## 2016-08-30 NOTE — Assessment & Plan Note (Signed)
S:  a1c up some. Weight up 3 lbs from last visit but she is normal weight. On metformin 250mg  in AM. On full pill got shaky in AM and some blurred vision- ? Low blood sugar Lab Results  Component Value Date   HGBA1C 6.3 08/23/2016  A/P: trial 250mg  of metformin twice a day with meals. She will let us know if any side effects like last time

## 2016-08-30 NOTE — Assessment & Plan Note (Signed)
S: well controlled on atorvastatin 80mg  with LDL 53. No myalgias.  Lab Results  Component Value Date   CHOL 129 08/23/2016   HDL 58.40 08/23/2016   LDLCALC 53 08/23/2016   TRIG 89.0 08/23/2016   CHOLHDL 2 08/23/2016   A/P: no change- excellent control

## 2016-08-30 NOTE — Assessment & Plan Note (Signed)
S: controlled on . bystolic 2.5mg  and valsartan 80mg , now changed to irbesartan 75mg   BP Readings from Last 3 Encounters:  08/30/16 138/72  08/09/16 130/62  08/03/16 110/74  A/P: We discussed blood pressure goal of <140/90. Continue current meds.

## 2016-09-01 DIAGNOSIS — K648 Other hemorrhoids: Secondary | ICD-10-CM | POA: Diagnosis not present

## 2016-09-19 DIAGNOSIS — J471 Bronchiectasis with (acute) exacerbation: Secondary | ICD-10-CM | POA: Diagnosis not present

## 2016-09-19 DIAGNOSIS — J449 Chronic obstructive pulmonary disease, unspecified: Secondary | ICD-10-CM | POA: Diagnosis not present

## 2016-09-19 DIAGNOSIS — J4550 Severe persistent asthma, uncomplicated: Secondary | ICD-10-CM | POA: Diagnosis not present

## 2016-09-28 NOTE — Progress Notes (Signed)
HPI: FU CAD; history of coronary artery disease status post coronary artery bypass graft in 2002. Carotid Dopplers December 2015 showed no significant obstruction. Echocardiogram March 2017 showed normal LV function and trace aortic insufficiency. CTA March 2017 showed no pulmonary embolus. There was bronchiectasis noted. Nuclear study April 2017 showed ejection fraction 64% and no ischemia or infarction. Since last seen she has mild dyspnea on exertion but no orthopnea, PND, pedal edema, palpitations, syncope or chest pain.  Current Outpatient Prescriptions  Medication Sig Dispense Refill  . acetaminophen (TYLENOL) 650 MG CR tablet every 8 (eight) hours as needed for pain.     Marland Kitchen albuterol (PROAIR HFA) 108 (90 Base) MCG/ACT inhaler Inhale 2 puffs into the lungs every 6 (six) hours as needed for wheezing or shortness of breath. 1 Inhaler 11  . ALPRAZolam (XANAX) 0.25 MG tablet take 1 tablet by mouth at bedtime if needed for sleep 60 tablet 4  . amitriptyline (ELAVIL) 50 MG tablet take 1 tablet by mouth at bedtime 90 tablet 3  . aspirin EC 81 MG tablet Take 81 mg by mouth every other day.    Marland Kitchen atorvastatin (LIPITOR) 80 MG tablet Take 1 tablet (80 mg total) by mouth daily. 90 tablet 3  . budesonide-formoterol (SYMBICORT) 80-4.5 MCG/ACT inhaler Take 2 puffs first thing in am and then another 2 puffs about 12 hours later. 1 Inhaler 12  . BYSTOLIC 2.5 MG tablet take 1 tablet by mouth once daily 90 tablet 3  . cefdinir (OMNICEF) 300 MG capsule Take 1 capsule (300 mg total) by mouth 2 (two) times daily. 14 capsule 11  . Dextromethorphan-Guaifenesin (MUCINEX DM) 30-600 MG TB12 Take 1 tablet by mouth 2 (two) times daily as needed. Patient takes 1200mg     . irbesartan (AVAPRO) 75 MG tablet Take 1 tablet (75 mg total) by mouth daily. 90 tablet 1  . LINZESS 290 MCG CAPS capsule   0  . metFORMIN (GLUCOPHAGE) 500 MG tablet Take 0.5 tablets (250 mg total) by mouth 2 (two) times daily with a meal. 90 tablet  3  . Respiratory Therapy Supplies (FLUTTER) DEVI Use as directed 1 each 0  . SYNTHROID 75 MCG tablet take 1 tablet by mouth 6 DAYS A WEEK THEN TAKE 1 AND 1/2  TABLETS ON THE 7TH DAY 95 tablet 3  . UNABLE TO FIND Smart Vest  10Hz  40% - 71/2 mins 11Hz  40%- 7 1/2 mins     No current facility-administered medications for this visit.      Past Medical History:  Diagnosis Date  . Bronchiectasis    oxygen at night in the past  . CAD (coronary artery disease)   . Diverticulitis 2014  . GERD (gastroesophageal reflux disease)   . History of shingles 04/2012  . HTN (hypertension)   . Hyperlipidemia   . Hypothyroidism   . MAI (mycobacterium avium-intracellulare) ([place])   . Neuritis of upper extremity     Past Surgical History:  Procedure Laterality Date  . CORONARY ARTERY BYPASS GRAFT  2004   x3 CABG    Social History   Social History  . Marital status: Single    Spouse name: N/A  . Number of children: N/A  . Years of education: N/A   Occupational History  . Not on file.   Social History Main Topics  . Smoking status: Former Smoker    Packs/day: 1.00    Years: 20.00    Types: Cigarettes    Quit date: 01/25/1970  .  Smokeless tobacco: Never Used  . Alcohol use No  . Drug use: No  . Sexual activity: Not on file   Other Topics Concern  . Not on file   Social History Narrative   Family: Single never married, no children, cat and rehabs turtles and tortoise   Went to [employer] in [employer] and social work, some business courses at [employer], EMT for 6 years.    LIves alone. Completely independent.    Lives in retirement community.       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 Colon cancer Mother   . Hyperlipidemia Mother   . Hypertension Mother   . Heart disease Mother   . Stroke Mother   . Diabetes Mother   .  Colon cancer Father   . Arthritis Father   . Hyperlipidemia Father   . Hypertension Father   . Heart disease Father   . Stroke Father   . Atopy Neg Hx     ROS: no fevers or chills, productive cough, hemoptysis, dysphasia, odynophagia, melena, hematochezia, dysuria, hematuria, rash, seizure activity, orthopnea, PND, pedal edema, claudication. Remaining systems are negative.  Physical Exam: Well-developed well-nourished in no acute distress.  Skin is warm and dry.  HEENT is normal.  Neck is supple. No bruits Chest is clear to auscultation with normal expansion.  Cardiovascular exam is regular rate and rhythm.  Abdominal exam nontender or distended. No masses palpated. Extremities show no edema. neuro grossly intact  ECG- sinus rhythm at a rate of 69. Nonspecific ST changes. personally reviewed  A/P  1 coronary artery disease-patient is status post coronary artery bypass graft. No recurrent chest pain. Continue medical therapy including a statin.  2 hypertension-blood pressure mildly elevated. Increase Avapro to 150 mg daily. Check potassium and renal function in 1 week.  3 hyperlipidemia-continue statin. Laboratories from July 2018 personally reviewed. Total cholesterol 129 with LDL 53. Liver functions normal.  4 carotid artery disease-continue aspirin and statin.  Kirk Ruths, MD

## 2016-09-29 DIAGNOSIS — N952 Postmenopausal atrophic vaginitis: Secondary | ICD-10-CM | POA: Diagnosis not present

## 2016-09-29 DIAGNOSIS — Z01419 Encounter for gynecological examination (general) (routine) without abnormal findings: Secondary | ICD-10-CM | POA: Diagnosis not present

## 2016-09-29 DIAGNOSIS — L309 Dermatitis, unspecified: Secondary | ICD-10-CM | POA: Diagnosis not present

## 2016-10-04 ENCOUNTER — Ambulatory Visit (INDEPENDENT_AMBULATORY_CARE_PROVIDER_SITE_OTHER): Payer: Medicare Other | Admitting: Cardiology

## 2016-10-04 ENCOUNTER — Encounter: Payer: Self-pay | Admitting: Cardiology

## 2016-10-04 VITALS — BP 138/70 | HR 69 | Ht 60.5 in | Wt 126.0 lb

## 2016-10-04 DIAGNOSIS — E7800 Pure hypercholesterolemia, unspecified: Secondary | ICD-10-CM

## 2016-10-04 DIAGNOSIS — I10 Essential (primary) hypertension: Secondary | ICD-10-CM | POA: Diagnosis not present

## 2016-10-04 DIAGNOSIS — I25810 Atherosclerosis of coronary artery bypass graft(s) without angina pectoris: Secondary | ICD-10-CM

## 2016-10-04 MED ORDER — IRBESARTAN 150 MG PO TABS: 150.0000 mg | ORAL_TABLET | Freq: Every day | ORAL | 3 refills | Status: DC

## 2016-10-04 NOTE — Patient Instructions (Signed)
Medication Instructions:   INCREASE IRBESARTAN TO 150 MG ONCE DAILY= 2 OF THE 75 MG TABLETS ONCE DAILY  Labwork:  Your physician recommends that you return for lab work in: [place]:  Your physician wants you to follow-up in: [place] will receive a reminder letter in the mail two months in advance. If you don't receive a letter, please call our office to schedule the follow-up appointment.   If you need a refill on your cardiac medications before your next appointment, please call your pharmacy.

## 2016-10-05 DIAGNOSIS — Z85828 Personal history of other malignant neoplasm of skin: Secondary | ICD-10-CM | POA: Diagnosis not present

## 2016-10-05 DIAGNOSIS — L570 Actinic keratosis: Secondary | ICD-10-CM | POA: Diagnosis not present

## 2016-10-05 DIAGNOSIS — L821 Other seborrheic keratosis: Secondary | ICD-10-CM | POA: Diagnosis not present

## 2016-10-05 DIAGNOSIS — L720 Epidermal cyst: Secondary | ICD-10-CM | POA: Diagnosis not present

## 2016-10-05 DIAGNOSIS — C44712 Basal cell carcinoma of skin of right lower limb, including hip: Secondary | ICD-10-CM | POA: Diagnosis not present

## 2016-10-05 DIAGNOSIS — D692 Other nonthrombocytopenic purpura: Secondary | ICD-10-CM | POA: Diagnosis not present

## 2016-10-06 DIAGNOSIS — H532 Diplopia: Secondary | ICD-10-CM | POA: Diagnosis not present

## 2016-10-06 DIAGNOSIS — H5203 Hypermetropia, bilateral: Secondary | ICD-10-CM | POA: Diagnosis not present

## 2016-10-15 ENCOUNTER — Other Ambulatory Visit: Payer: Self-pay | Admitting: Internal Medicine

## 2016-10-15 DIAGNOSIS — I25810 Atherosclerosis of coronary artery bypass graft(s) without angina pectoris: Secondary | ICD-10-CM | POA: Diagnosis not present

## 2016-10-15 LAB — BASIC METABOLIC PANEL
BUN/Creatinine Ratio: 16 (ref 12–28)
BUN: 14 mg/dL (ref 8–27)
CHLORIDE: 98 mmol/L (ref 96–106)
CO2: 23 mmol/L (ref 20–29)
CREATININE: 0.86 mg/dL (ref 0.57–1.00)
Calcium: 8.9 mg/dL (ref 8.7–10.3)
GFR calc Af Amer: 78 mL/min/{1.73_m2} (ref >59)
GFR calc non Af Amer: 67 mL/min/{1.73_m2} (ref >59)
GLUCOSE: 98 mg/dL (ref 65–99)
Potassium: 4.1 mmol/L (ref 3.5–5.2)
SODIUM: 137 mmol/L (ref 134–144)

## 2016-10-20 DIAGNOSIS — J4550 Severe persistent asthma, uncomplicated: Secondary | ICD-10-CM | POA: Diagnosis not present

## 2016-10-20 DIAGNOSIS — K648 Other hemorrhoids: Secondary | ICD-10-CM | POA: Diagnosis not present

## 2016-10-20 DIAGNOSIS — K581 Irritable bowel syndrome with constipation: Secondary | ICD-10-CM | POA: Diagnosis not present

## 2016-10-20 DIAGNOSIS — J449 Chronic obstructive pulmonary disease, unspecified: Secondary | ICD-10-CM | POA: Diagnosis not present

## 2016-10-20 DIAGNOSIS — J471 Bronchiectasis with (acute) exacerbation: Secondary | ICD-10-CM | POA: Diagnosis not present

## 2016-10-25 ENCOUNTER — Other Ambulatory Visit: Payer: Self-pay | Admitting: Family Medicine

## 2016-10-27 ENCOUNTER — Other Ambulatory Visit: Payer: Self-pay

## 2016-10-27 ENCOUNTER — Telehealth: Payer: Self-pay | Admitting: Family Medicine

## 2016-10-27 DIAGNOSIS — H5032 Intermittent alternating esotropia: Secondary | ICD-10-CM | POA: Diagnosis not present

## 2016-10-27 MED ORDER — SYNTHROID 75 MCG PO TABS: ORAL_TABLET | ORAL | 3 refills | Status: DC

## 2016-10-27 NOTE — Telephone Encounter (Signed)
MEDICATION:   SYNTHROID 75 MCG tablet     PHARMACY:   [facility] [place], [address] AT [address] [phone] (Phone) [phone] (Fax)     IS THIS A 90 DAY SUPPLY : yes  IS PATIENT OUT OF MEDICATION: yes  IF NOT; HOW MUCH IS LEFT:   LAST APPOINTMENT DATE: @08 /06/2016  NEXT APPOINTMENT DATE:@1 /23/2019  OTHER COMMENTS:    **Let patient know to contact pharmacy at the end of the day to make sure medication is ready. **  ** Please notify patient to allow 48-72 hours to process**  **Encourage patient to contact the pharmacy for refills or they can request refills through [facility]**

## 2016-10-27 NOTE — Telephone Encounter (Signed)
Prescription sent to pharmacy.

## 2016-11-05 ENCOUNTER — Ambulatory Visit (INDEPENDENT_AMBULATORY_CARE_PROVIDER_SITE_OTHER): Payer: Medicare Other | Admitting: Internal Medicine

## 2016-11-05 ENCOUNTER — Encounter: Payer: Self-pay | Admitting: Internal Medicine

## 2016-11-05 ENCOUNTER — Other Ambulatory Visit (INDEPENDENT_AMBULATORY_CARE_PROVIDER_SITE_OTHER): Payer: Medicare Other

## 2016-11-05 VITALS — BP 122/66 | HR 63 | Ht 60.5 in | Wt 121.8 lb

## 2016-11-05 DIAGNOSIS — Z23 Encounter for immunization: Secondary | ICD-10-CM

## 2016-11-05 DIAGNOSIS — N3090 Cystitis, unspecified without hematuria: Secondary | ICD-10-CM

## 2016-11-05 DIAGNOSIS — J479 Bronchiectasis, uncomplicated: Secondary | ICD-10-CM

## 2016-11-05 LAB — URINALYSIS, ROUTINE W REFLEX MICROSCOPIC
NITRITE: NEGATIVE
Specific Gravity, Urine: 1.025 (ref 1.000–1.030)
Total Protein, Urine: 100 — AB
URINE GLUCOSE: NEGATIVE
UROBILINOGEN UA: 0.2 (ref 0.0–1.0)
pH: 6 (ref 5.0–8.0)

## 2016-11-05 MED ORDER — BUDESONIDE-FORMOTEROL FUMARATE 80-4.5 MCG/ACT IN AERO: 2.0000 | INHALATION_SPRAY | Freq: Two times a day (BID) | RESPIRATORY_TRACT | 0 refills | Status: DC

## 2016-11-05 MED ORDER — BUDESONIDE-FORMOTEROL FUMARATE 80-4.5 MCG/ACT IN AERO: INHALATION_SPRAY | RESPIRATORY_TRACT | 3 refills | Status: DC

## 2016-11-05 NOTE — Progress Notes (Signed)
ATC, NA and no option to leave msg (pt had lost power) WCB on 10/12

## 2016-11-05 NOTE — Patient Instructions (Addendum)
Please remember to go to the lab department downstairs in the basement  for your tests - we will call you with the results when they are available.     For cough /congestion > mucinex dm up to 1200 mg every 12 hours as needed if you feel it helps   Please schedule a follow up visit in 3 months but call sooner if needed

## 2016-11-05 NOTE — Progress Notes (Addendum)
Subjective:     Patient ID: Belinda Day, female   DOB: [birth year]    MRN: [id number]   Brief patient profile:  71  yowf quit smoking 1972 with documented right middle lobe  syndrome and evidence of bronchiectasis by CT scan in March 2002   History of Present Illness  08/08/07 FOB with classic cobblestoning and MAI on culture.   08/15/07 given Levaquin x 10 days with resolution bloody mucus, but "felt she had flu the whole time" with aches, feverish   August 29, 2007 ov: first post bronch co still coughing up mucus clear and initiate rx with symbicort/ [place]   October 19, 2007 ov no cough , sob, feeling great but no improvement on cxr   December 07, 2007 ov feeling great, minimal am cough not productive. No sob.   Opth eval, labs ok 10/2007   September 06, 2008 ov overall better over the last year, less tendency to exac on zmax and ethambutol. rec complete another year > satisfied improved 90% and stopped zmax and eth 08/2009     08/14/2015  f/u ov/Jazelyn Sipe re:  obst bronchiectasis on symbicort 160 2bid  Chief Complaint  Patient presents with  . Follow-up    Cough still the same,coughed up bright red bld. on 2 occassions since last ov,lasted 1 day each-felt like sorethroat at the time,sob occass.,worse with humidity,denies cp or tightness,no fcs.Saw Dr. Stanford Breed last wk. everything was good.  just two episodes hemoptysis  x one month while on asa but the total may approach 2 tbsp in 24 h rec Hold aspirin anytime you notice more blood than usual  > stopped aspirin permanently For definite change in mucus > Zpak     10/16/2015  f/u ov/Davonn Flanery re: obst bronchiectasis/ flare on symb 160 2bid and prn zpak Chief Complaint  Patient presents with  . Acute Visit    c/o coughing up bld.bright red 3 episodes since last ov. Took Z-pak each time. Last time finished was Sept. 14th. Not coughing up bld. now,no cough now. Off Aspirin.Shaky today.Using Albuterol 2-3 times a wk.Sob  same,occass. wheezing.Denies cp or tightness, No fcs.  no   Purulent sputum just blood, chest discomfort with cough but does not lateralize Never coughed up more than a few tbsp per day Not limited by breathing from desired activities   rec Change zithromax 250 mg daily and reduce the symbicort to 80 Take 2 puffs first thing in am and then another 2 puffs about 12 hours later.  When cough flares, cough into the flutter valve and use the mucinex up to 1200 mg every 12 hours as needed  If mucus turns nasty > omnicef 300 mg twice daily x 10 days  Please schedule a follow up office visit in 6 weeks (change the appointment)  lated add:  omnicef should be 7 days for now > called to correct       03/02/2016  f/u ov/Manny Vitolo re:  obst bronchiectasis  Off zmax/ on symb 80 2bid  Chief Complaint  Patient presents with  . Follow-up    Cough is unchanged. She is using proair 3 x wkly on average. Last course of Omnicef was taken in mid Dec 2017.   did better on zmax with less cough / less am am cough / congestion worse in am x 2-3 tbsp thick clear mucus x sev hours but no noct cough  On mucinex 600 one twice daily max but confused with dosing and also  taking Rob dm  rec Continue on Azithromycin daily .  Continue on Mucinex and Flutter valve .  Prednisone taper over next week.  VEST order sent in .  Continue on Symbicort .     05/04/2016  f/u ov/Shawnise Peterkin re: obst bronchiectasis on zmax 250 mg daily / symb 80 2bid and maybe twice weekly saba Chief Complaint  Patient presents with  . Follow-up    Doing well overall. She uses proair 2 x per wk as needed.   worse 1st thing in am x 3 hours maybe a cup and after supper repeats > mucus yellowish,thick not bloody /using flutter valve 4 x in 3hours with marginal benefit / already on daily zmax  rec For cough mucinex dm total is 1200 mg every 12 hours as needed  schedule VEST and use it at least 4 x daily but esp in am and when the cough starts back up in pm and  at bedtime      08/03/2016  f/u ov/Skylur Fuston re: obst bronchiectasis now on flutter  Chief Complaint  Patient presents with  . Follow-up    Pt here today stating her breathing has improved, she states she uses her smart vest 1-2 times a day, she states the increase of the mucinex has helped, she states the symbicort has helped open her up, She is still coughing sometimes it is productive,slight wheezing, Denies chest tightness   after am treatment produces pale yellow much / still using the 1200 mucinex dm bid and flutter rec Ok to try off zmax and just use omnicef if flare  Continue with the flutter valve as much as possible      11/05/2016  f/u ov/Kimberle Stanfill re:  obst bronchiectasis/  Using vest sev times esp in am  Chief Complaint  Patient presents with  . Follow-up    Breathing is unchanged. She is using her albuterol inhaler 3 x per wk on average.    mucus is clear / no omnicef since last ov dysria x one week  Really Not limited by breathing from desired activities     No obvious day to day or daytime variability or assoc excess/ purulent sputum or mucus plugs or hemoptysis or cp or chest tightness, subjective wheeze or overt sinus or hb symptoms. No unusual exp hx or h/o childhood pna/ asthma or knowledge of premature birth.  Sleeping ok flat without nocturnal  or early am exacerbation  of respiratory  c/o's or need for noct saba. Also denies any obvious fluctuation of symptoms with weather or environmental changes or other aggravating or alleviating factors except as outlined above   Current Allergies, Complete Past Medical History, Past Surgical History, Family History, and Social History were reviewed in [facility] record.  ROS  The following are not active complaints unless bolded Hoarseness, sore throat, dysphagia, dental problems, itching, sneezing,  nasal congestion or discharge of excess mucus or purulent secretions, ear ache,   fever, chills, sweats,  unintended wt loss or wt gain, classically pleuritic or exertional cp,  orthopnea pnd or leg swelling, presyncope, palpitations, abdominal pain, anorexia, nausea, vomiting, diarrhea  or change in bowel habits or change in bladder habits= dysuria/ freq, change in stools or change in urine, dysuria, hematuria,  rash, arthralgias, visual complaints, headache, numbness, weakness or ataxia or problems with walking or coordination,  change in mood/affect or memory.        Current Meds  Medication Sig  . acetaminophen (TYLENOL) 650 MG CR tablet every  8 (eight) hours as needed for pain.   Marland Kitchen albuterol (PROAIR HFA) 108 (90 Base) MCG/ACT inhaler Inhale 2 puffs into the lungs every 6 (six) hours as needed for wheezing or shortness of breath.  . ALPRAZolam (XANAX) 0.25 MG tablet take 1 tablet by mouth at bedtime if needed for sleep  . amitriptyline (ELAVIL) 50 MG tablet take 1 tablet by mouth at bedtime  . aspirin EC 81 MG tablet Take 81 mg by mouth every other day.  Marland Kitchen atorvastatin (LIPITOR) 80 MG tablet Take 1 tablet (80 mg total) by mouth daily.  . budesonide-formoterol (SYMBICORT) 80-4.5 MCG/ACT inhaler INHALE 2 PUFFS INTO THE LUNGS FIRST THING IN THE MORNING AND 2 PUFFS 12 HOURS LATER  . BYSTOLIC 2.5 MG tablet take 1 tablet by mouth once daily  . Dextromethorphan-Guaifenesin (MUCINEX DM) 30-600 MG TB12 Take 1 tablet by mouth 2 (two) times daily as needed. Patient takes 1200mg   . irbesartan (AVAPRO) 150 MG tablet Take 1 tablet (150 mg total) by mouth daily.  Marland Kitchen LINZESS 290 MCG CAPS capsule   . metFORMIN (GLUCOPHAGE) 500 MG tablet Take 0.5 tablets (250 mg total) by mouth 2 (two) times daily with a meal.  . Respiratory Therapy Supplies (FLUTTER) DEVI Use as directed  . SYNTHROID 75 MCG tablet take 1 tablet by mouth 6 DAYS A WEEK THEN TAKE 1 AND 1/2  TABLETS ON THE 7TH DAY  . UNABLE TO FIND Smart Vest  10Hz  40% - 71/2 mins 11Hz  40%- 7 1/2 mins  . [DISCONTINUED] SYMBICORT 80-4.5 MCG/ACT inhaler INHALE 2 PUFFS  INTO THE LUNGS FIRST THING IN THE MORNING AND 2 PUFFS 12 HOURS LATER                             Past Medical History:  Bronchiectasis see CT SE 04/13/00  - HFA 75% November 19, 2009  - alpha one screen 01/14/2015 >  MM  - IgE 01/14/2015 = 11  MAI  - Rx Zmax and ETH 08/29/07 > 08/2009 restarted empirically 05/31/14 > 09/03/14 (no change in cough so just use zpak for flares)  - Rx zmax maint  10/16/2015 >>> d/c 08/03/2016  - Eye eval   10[place] try cycles of [place]ipro April 02, 2010  HEALTH MAINTENANCE...........................[name] - T[place] 10/2007  - Pneumovax 2005   and 10/29/2011 age 44, prevnar 08/16/2013  CAD  Hyperlipidemia  Hypertension  History of cough with ACE inhibition.  Gastroesophageal reflux disease         Objective:   Physical Exam   In general she is an extremely  pleasant ambulatory white female  Vital signs reviewed  - note sats 95% on arrival RA   Wt 130 October 19, 2007>140 March 31, 2010 > 127 07/16/2010 > 10/14/2010  120 > 05/04/2011  117 > 07/30/2011  120 > 10/29/2011 118 > 130  05/01/2012 > 12/12/2012 132 >  08/14/13 137 >    02/20/2014  137 >  05/31/2014 134 > 09/03/2014    140 > 10/15/2014 137 > 01/14/2015 137 >  05/15/2015 123 >08/14/2015  124 >  10/16/2015 126 > 03/02/2016   124  > 05/04/2016  126 > 08/03/2016   130   HEENT: nl dentition, turbinates, and orophanx. Nl external ear canals without cough reflex  Neck without JVD/Nodes/TM  Lungs  Very min  insp pops squeaks both Bases  with min   Exp rhonchi bilaterally  RRR no  s3 or murmur or increase in P2  Abd soft and benign with nl excursion in the supine position. No bruits or organomegaly  Ext warm without calf tenderness, cyanosis clubbing or edema  Neg CVAT   Labs ordered 11/05/2016    U/a:   Pos pyuria              Assessment:

## 2016-11-07 NOTE — Assessment & Plan Note (Signed)
-   PFT's 10/14/2010  FEV1  1.25 (68%) and ratio 58% and DLCO 90%     - PFT's 10/29/2011  FEV1  1.33 (74%) and ratio 57 % and DLCO 93%    - PFTs 08/14/2013   FEV1  1.16 (60%) and ratio 61 with dlco 83%     - Flutter valve added 09/03/14      - alpha one   01/14/2015 >  MM, level 147     - IgE  01/14/15  11 - CT chest 04/17/15 Marked chronic bronchiectasis and volume loss in the right middle lobe with milder bronchiectasis, bronchial wall thickening, and nodular densities throughout the right upper and right lower lobe suggestive of chr onic endobronchial/atypical mycobacterial infection. - 10/16/2015 changed to symbicort 80 2bid (? Higher doses contributing to w MAI /freq of infections)   - 03/02/2016  After extensive coaching HFA effectiveness =    90%  - 05/04/2016 VEST stared around May 25 2016 - try off zmax 08/03/2016 > no change clinically as of 11/05/2016   No change rx needed   Each maintenance medication was reviewed in detail including most importantly the difference between maintenance and as needed and under what circumstances the prns are to be used.  Please see AVS for specific  Instructions which are unique to this visit and I personally typed out  which were reviewed in detail in writing with the patient and a copy provided.

## 2016-11-07 NOTE — Assessment & Plan Note (Signed)
U/A pos pyuria so can use her prn omnicef for this and f/u with pcp prn

## 2016-11-08 NOTE — Progress Notes (Signed)
Spoke with pt and notified of results per Dr. Wert. Pt verbalized understanding and denied any questions. 

## 2016-11-17 DIAGNOSIS — K648 Other hemorrhoids: Secondary | ICD-10-CM | POA: Diagnosis not present

## 2016-11-19 DIAGNOSIS — J449 Chronic obstructive pulmonary disease, unspecified: Secondary | ICD-10-CM | POA: Diagnosis not present

## 2016-11-19 DIAGNOSIS — J471 Bronchiectasis with (acute) exacerbation: Secondary | ICD-10-CM | POA: Diagnosis not present

## 2016-11-19 DIAGNOSIS — J4550 Severe persistent asthma, uncomplicated: Secondary | ICD-10-CM | POA: Diagnosis not present

## 2016-11-29 ENCOUNTER — Encounter: Payer: Self-pay | Admitting: Cardiology

## 2016-11-30 ENCOUNTER — Other Ambulatory Visit: Payer: Self-pay | Admitting: *Deleted

## 2016-11-30 MED ORDER — LOSARTAN POTASSIUM 50 MG PO TABS: 50.0000 mg | ORAL_TABLET | Freq: Every day | ORAL | 3 refills | Status: DC

## 2016-12-19 IMAGING — DX DG CHEST 2V
2 series · 2 of 2 positions shown · non-contrast
Comparison: Chest x-ray of April 17, 2015 and CT scan of the same
day.

CLINICAL DATA: Intermittent hemoptysis over the past 3 months;
history of bronchiectasis, an 8 night, CABG, former smoker.

EXAM:
CHEST  2 VIEW

[chest pa]
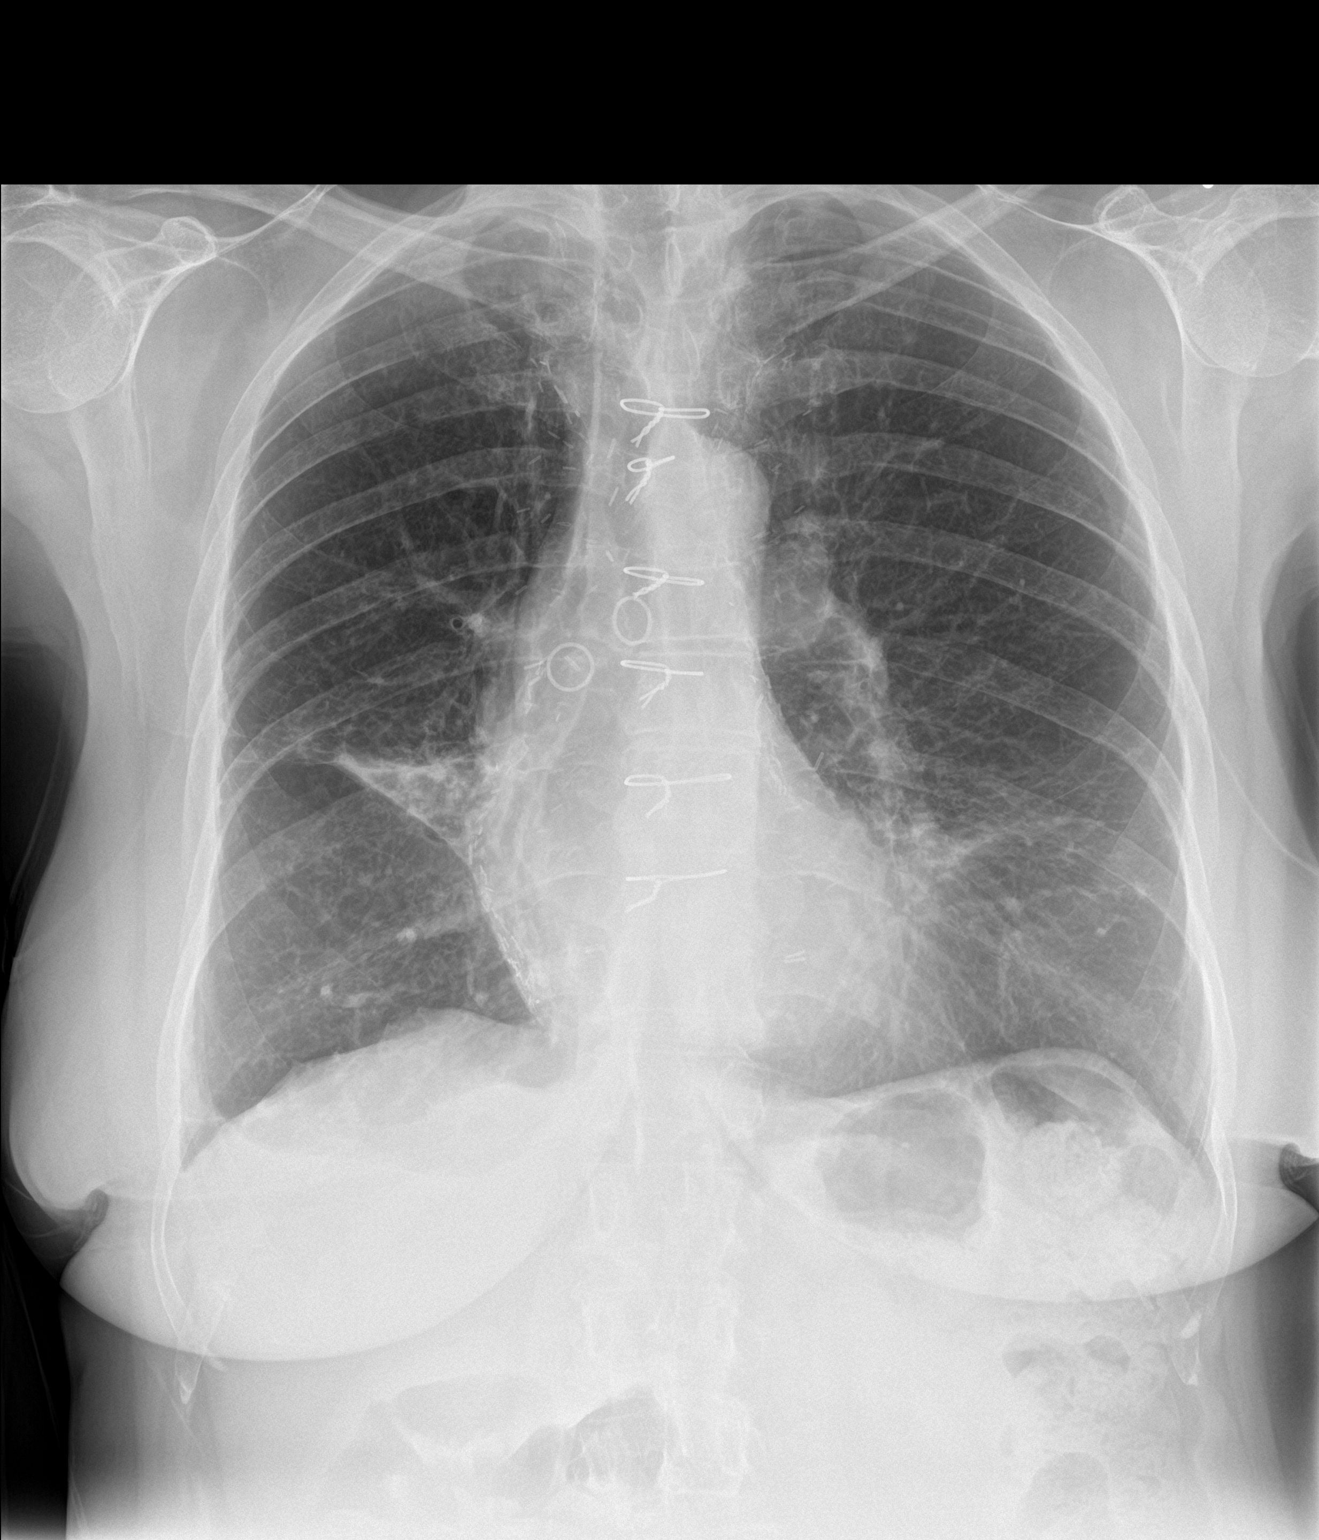

[chest lat]
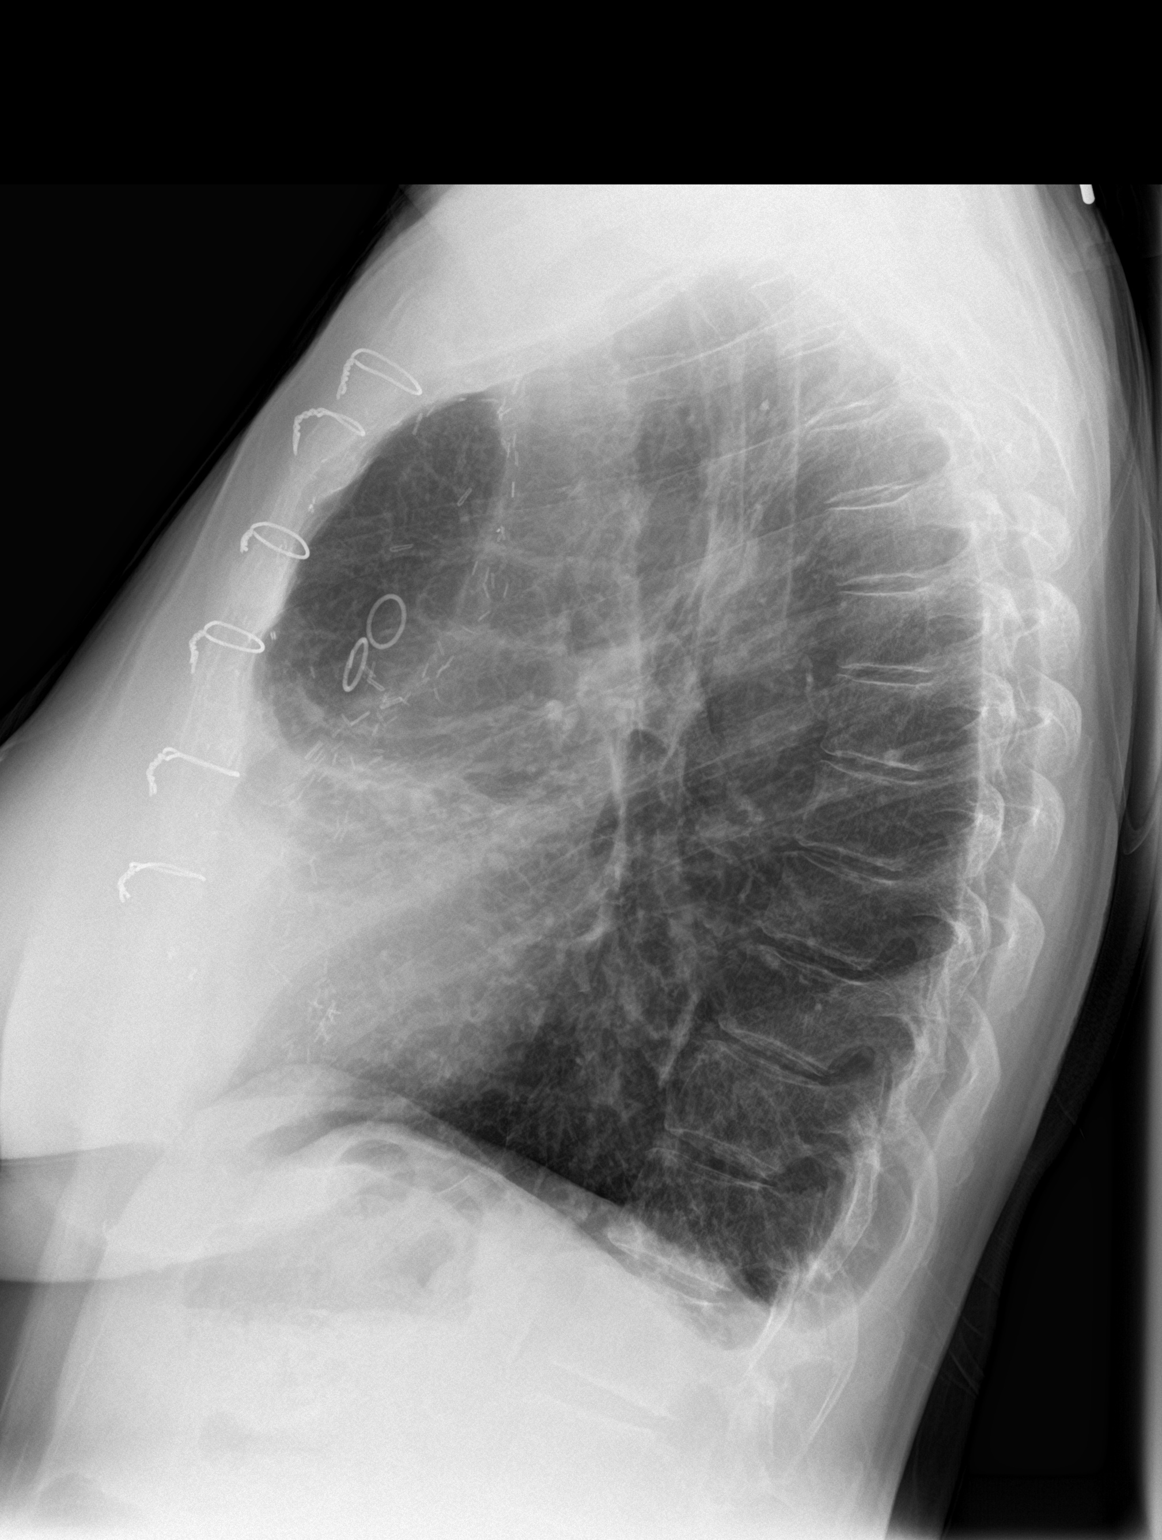

[2 of 2 positions shown; findings below may reference images not displayed]

FINDINGS: The lungs are well-expanded. The interstitial markings are coarse.
There is scarring in the right middle lobe and lingula which appears
stable. There is stable mild blunting of the posterior costophrenic
angles. The heart is normal in size. There are post CABG changes.
The sternal wires are intact. The retrosternal soft tissues are
unremarkable. The thoracic spine exhibits no significant
abnormalities.
IMPRESSION: Stable chronic changes within both lungs consistent with previous
infection. Previous CABG. No acute cardiopulmonary abnormality.

## 2016-12-20 DIAGNOSIS — J471 Bronchiectasis with (acute) exacerbation: Secondary | ICD-10-CM | POA: Diagnosis not present

## 2016-12-20 DIAGNOSIS — J4550 Severe persistent asthma, uncomplicated: Secondary | ICD-10-CM | POA: Diagnosis not present

## 2016-12-20 DIAGNOSIS — J449 Chronic obstructive pulmonary disease, unspecified: Secondary | ICD-10-CM | POA: Diagnosis not present

## 2016-12-27 DIAGNOSIS — Z1231 Encounter for screening mammogram for malignant neoplasm of breast: Secondary | ICD-10-CM | POA: Diagnosis not present

## 2016-12-27 DIAGNOSIS — M8589 Other specified disorders of bone density and structure, multiple sites: Secondary | ICD-10-CM | POA: Diagnosis not present

## 2016-12-27 LAB — HM DEXA SCAN

## 2016-12-27 LAB — HM MAMMOGRAPHY

## 2016-12-30 ENCOUNTER — Encounter: Payer: Self-pay | Admitting: Family Medicine

## 2017-01-06 ENCOUNTER — Encounter: Payer: Self-pay | Admitting: Family Medicine

## 2017-01-14 ENCOUNTER — Encounter: Payer: Self-pay | Admitting: Cardiology

## 2017-01-19 DIAGNOSIS — J4550 Severe persistent asthma, uncomplicated: Secondary | ICD-10-CM | POA: Diagnosis not present

## 2017-01-19 DIAGNOSIS — J449 Chronic obstructive pulmonary disease, unspecified: Secondary | ICD-10-CM | POA: Diagnosis not present

## 2017-01-19 DIAGNOSIS — J471 Bronchiectasis with (acute) exacerbation: Secondary | ICD-10-CM | POA: Diagnosis not present

## 2017-02-01 ENCOUNTER — Encounter: Payer: Self-pay | Admitting: Cardiology

## 2017-02-02 MED ORDER — LISINOPRIL 20 MG PO TABS: 20.0000 mg | ORAL_TABLET | Freq: Every day | ORAL | 1 refills | Status: DC

## 2017-02-02 NOTE — Telephone Encounter (Signed)
DC cozaar; lisinopril 20 mg daily  Kirk Ruths

## 2017-02-07 ENCOUNTER — Ambulatory Visit: Payer: Medicare Other | Admitting: Internal Medicine

## 2017-02-14 ENCOUNTER — Encounter: Payer: Self-pay | Admitting: Internal Medicine

## 2017-02-14 ENCOUNTER — Ambulatory Visit: Payer: Medicare Other | Admitting: Internal Medicine

## 2017-02-14 VITALS — BP 116/78 | HR 94 | Ht 61.0 in | Wt 126.2 lb

## 2017-02-14 DIAGNOSIS — A310 Pulmonary mycobacterial infection: Secondary | ICD-10-CM

## 2017-02-14 DIAGNOSIS — J479 Bronchiectasis, uncomplicated: Secondary | ICD-10-CM

## 2017-02-14 MED ORDER — CEFDINIR 300 MG PO CAPS: 300.0000 mg | ORAL_CAPSULE | Freq: Two times a day (BID) | ORAL | 11 refills | Status: DC

## 2017-02-14 NOTE — Patient Instructions (Addendum)
As needed for nasty mucus > omnicef 300 mg twice daily x 7 days  Please schedule a follow up visit in 3 months but call sooner if needed with pfts and cxr

## 2017-02-14 NOTE — Assessment & Plan Note (Signed)
-   PFT's 10/14/2010  FEV1  1.25 (68%) and ratio 58% and DLCO 90%     - PFT's 10/29/2011  FEV1  1.33 (74%) and ratio 57 % and DLCO 93%    - PFTs 08/14/2013   FEV1  1.16 (60%) and ratio 61 with dlco 83%     - Flutter valve added 09/03/14      - alpha one   01/14/2015 >  MM, level 147     - IgE  01/14/15  11 - CT chest 04/17/15 Marked chronic bronchiectasis and volume loss in the right middle lobe with milder bronchiectasis, bronchial wall thickening, and nodular densities throughout the right upper and right lower lobe suggestive of chr onic endobronchial/atypical mycobacterial infection. - 10/16/2015 changed to symbicort 80 2bid (? Higher doses contributing to w MAI /freq of infections)   - 03/02/2016  After extensive coaching HFA effectiveness =    90%  - 05/04/2016 VEST stared around May 25 2016 - try off zmax 08/03/2016 > no change clinically as of 11/05/2016  - 02/14/2017 cycles of omnicef x 7 days prn purulent sputum    Adequate control on present rx, reviewed in detail with pt > no change in rx needed    Each maintenance medication was reviewed in detail including most importantly the difference between maintenance and as needed and under what circumstances the prns are to be used.  Please see AVS for specific  Instructions which are unique to this visit and I personally typed out  which were reviewed in detail in writing with the patient and a copy provided.    F/u with pfts in 3 m

## 2017-02-14 NOTE — Progress Notes (Signed)
Subjective:     Patient ID: Belinda Day, female   DOB: [birth year]    MRN: [id number]   Brief patient profile:  72  yowf quit smoking 1972 with documented right middle lobe  syndrome and evidence of bronchiectasis by CT scan in March 2002   History of Present Illness  08/08/07 FOB with classic cobblestoning and MAI on culture.   08/15/07 given Levaquin x 10 days with resolution bloody mucus, but "felt she had flu the whole time" with aches, feverish   August 29, 2007 ov: first post bronch co still coughing up mucus clear and initiate rx with symbicort/ [place]   October 19, 2007 ov no cough , sob, feeling great but no improvement on cxr   December 07, 2007 ov feeling great, minimal am cough not productive. No sob.   Opth eval, labs ok 10/2007   September 06, 2008 ov overall better over the last year, less tendency to exac on zmax and ethambutol. rec complete another year > satisfied improved 90% and stopped zmax and eth 08/2009     08/14/2015  f/u ov/Belinda Day re:  obst bronchiectasis on symbicort 160 2bid  Chief Complaint  Patient presents with  . Follow-up    Cough still the same,coughed up bright red bld. on 2 occassions since last ov,lasted 1 day each-felt like sorethroat at the time,sob occass.,worse with humidity,denies cp or tightness,no fcs.Saw Dr. Stanford Breed last wk. everything was good.  just two episodes hemoptysis  x one month while on asa but the total may approach 2 tbsp in 24 h rec Hold aspirin anytime you notice more blood than usual  > stopped aspirin permanently For definite change in mucus > Zpak     10/16/2015  f/u ov/Belinda Day re: obst bronchiectasis/ flare on symb 160 2bid and prn zpak Chief Complaint  Patient presents with  . Acute Visit    c/o coughing up bld.bright red 3 episodes since last ov. Took Z-pak each time. Last time finished was Sept. 14th. Not coughing up bld. now,no cough now. Off Aspirin.Shaky today.Using Albuterol 2-3 times a wk.Sob  same,occass. wheezing.Denies cp or tightness, No fcs.  no   Purulent sputum just blood, chest discomfort with cough but does not lateralize Never coughed up more than a few tbsp per day Not limited by breathing from desired activities   rec Change zithromax 250 mg daily and reduce the symbicort to 80 Take 2 puffs first thing in am and then another 2 puffs about 12 hours later.  When cough flares, cough into the flutter valve and use the mucinex up to 1200 mg every 12 hours as needed  If mucus turns nasty > omnicef 300 mg twice daily x 10 days  Please schedule a follow up office visit in 6 weeks (change the appointment)  lated add:  omnicef should be 7 days for now > called to correct       03/02/2016  f/u ov/Belinda Day re:  obst bronchiectasis  Off zmax/ on symb 80 2bid  Chief Complaint  Patient presents with  . Follow-up    Cough is unchanged. She is using proair 3 x wkly on average. Last course of Omnicef was taken in mid Dec 2017.   did better on zmax with less cough / less am am cough / congestion worse in am x 2-3 tbsp thick clear mucus x sev hours but no noct cough  On mucinex 600 one twice daily max but confused with dosing and also  taking Rob dm  rec Continue on Azithromycin daily .  Continue on Mucinex and Flutter valve .  Prednisone taper over next week.  VEST order sent in .  Continue on Symbicort .     05/04/2016  f/u ov/Belinda Day re: obst bronchiectasis on zmax 250 mg daily / symb 80 2bid and maybe twice weekly saba Chief Complaint  Patient presents with  . Follow-up    Doing well overall. She uses proair 2 x per wk as needed.   worse 1st thing in am x 3 hours maybe a cup and after supper repeats > mucus yellowish,thick not bloody /using flutter valve 4 x in 3hours with marginal benefit / already on daily zmax  rec For cough mucinex dm total is 1200 mg every 12 hours as needed  schedule VEST and use it at least 4 x daily but esp in am and when the cough starts back up in pm and  at bedtime      08/03/2016  f/u ov/Belinda Day re: obst bronchiectasis now on flutter  Chief Complaint  Patient presents with  . Follow-up    Pt here today stating her breathing has improved, she states she uses her smart vest 1-2 times a day, she states the increase of the mucinex has helped, she states the symbicort has helped open her up, She is still coughing sometimes it is productive,slight wheezing, Denies chest tightness   after am treatment produces pale yellow much / still using the 1200 mucinex dm bid and flutter rec Ok to try off zmax and just use omnicef if flare  Continue with the flutter valve as much as possible    02/14/2017  f/u ov/Belinda Day re: obst bronchiectasis  Chief Complaint  Patient presents with  . Follow-up    Breathing is overall doing well. She rarely uses rescue inhaler.    took zpak tgiving for uri x 3 weeks and completely resolved  Vest each am seems to help/ just clear mucus x sev tsp  Not needing flutter as much  Has not used cycle of omnicef since last ov  Not limited by breathing from desired activities     No obvious day to day or daytime variability or assoc excess/ purulent sputum or mucus plugs or hemoptysis or cp or chest tightness, subjective wheeze or overt sinus or hb symptoms. No unusual exposure hx or h/o childhood pna/ asthma or knowledge of premature birth.  Sleeping ok flat without nocturnal  or early am exacerbation  of respiratory  c/o's or need for noct saba. Also denies any obvious fluctuation of symptoms with weather or environmental changes or other aggravating or alleviating factors except as outlined above   Current Allergies, Complete Past Medical History, Past Surgical History, Family History, and Social History were reviewed in [employer] record.  ROS  The following are not active complaints unless bolded Hoarseness, sore throat, dysphagia, dental problems, itching, sneezing,  nasal congestion or discharge of  excess mucus or purulent secretions, ear ache,   fever, chills, sweats, unintended wt loss or wt gain, classically pleuritic or exertional cp,  orthopnea pnd or leg swelling, presyncope, palpitations, abdominal pain, anorexia, nausea, vomiting, diarrhea  or change in bowel habits or change in bladder habits, change in stools or change in urine, dysuria, hematuria,  rash, arthralgias, visual complaints, headache, numbness, weakness or ataxia or problems with walking or coordination,  change in mood/affect or memory.        Current Meds  Medication Sig  .  acetaminophen (TYLENOL) 650 MG CR tablet every 8 (eight) hours as needed for pain.   Marland Kitchen albuterol (PROAIR HFA) 108 (90 Base) MCG/ACT inhaler Inhale 2 puffs into the lungs every 6 (six) hours as needed for wheezing or shortness of breath.  . ALPRAZolam (XANAX) 0.25 MG tablet take 1 tablet by mouth at bedtime if needed for sleep  . amitriptyline (ELAVIL) 50 MG tablet take 1 tablet by mouth at bedtime  . aspirin EC 81 MG tablet Take 81 mg by mouth every other day.  Marland Kitchen atorvastatin (LIPITOR) 80 MG tablet Take 1 tablet (80 mg total) by mouth daily.  . budesonide-formoterol (SYMBICORT) 80-4.5 MCG/ACT inhaler INHALE 2 PUFFS INTO THE LUNGS FIRST THING IN THE MORNING AND 2 PUFFS 12 HOURS LATER  . BYSTOLIC 2.5 MG tablet take 1 tablet by mouth once daily  . Dextromethorphan-Guaifenesin (MUCINEX DM) 30-600 MG TB12 Take 1 tablet by mouth 2 (two) times daily as needed. Patient takes 1200mg   . LINZESS 290 MCG CAPS capsule   . lisinopril (PRINIVIL,ZESTRIL) 20 MG tablet Take 1 tablet (20 mg total) by mouth daily.  . metFORMIN (GLUCOPHAGE) 500 MG tablet Take 0.5 tablets (250 mg total) by mouth 2 (two) times daily with a meal.  . Respiratory Therapy Supplies (FLUTTER) DEVI Use as directed  . SYNTHROID 75 MCG tablet take 1 tablet by mouth 6 DAYS A WEEK THEN TAKE 1 AND 1/2  TABLETS ON THE 7TH DAY  . UNABLE TO FIND Smart Vest  10Hz  40% - 71/2 mins 11Hz  40%- 7 1/2 mins                                  Past Medical History:  Bronchiectasis see CT SE 04/13/00  - HFA 75% November 19, 2009  - alpha one screen 01/14/2015 >  MM  - IgE 01/14/2015 = 11  MAI  - Rx Zmax and ETH 08/29/07 > 08/2009 restarted empirically 05/31/14 > 09/03/14 (no change in cough so just use zpak for flares)  - Rx zmax maint  10/16/2015 >>> d/c 08/03/2016  - [place]   [place] April 02, 2010  HEALTH MAINTENANCE...........................[name] - [place] 10/2007  - Pneumovax 2005   and 10/29/2011 age 29, prevnar 08/16/2013  CAD  Hyperlipidemia  Hypertension  History of cough with ACE inhibition.  Gastroesophageal reflux disease         Objective:   Physical Exam     amb pleasant wf nad  Vital signs reviewed - Note on arrival 02 sats  100% on RA     Wt 130 October 19, 2007>140 March 31, 2010 > 127 07/16/2010 > 10/14/2010  120 > 05/04/2011  117 > 07/30/2011  120 > 10/29/2011 118 > 130  05/01/2012 > 12/12/2012 132 >  08/14/13 137 >    02/20/2014  137 >  05/31/2014 134 > 09/03/2014    140 > 10/15/2014 137 > 01/14/2015 137 >  05/15/2015 123 >08/14/2015  124 >  10/16/2015 126 > 03/02/2016   124  > 05/04/2016  126 > 08/03/2016   130 > 02/14/2017  126     HEENT: nl dentition, turbinates bilaterally, and oropharynx. Nl external ear canals without cough reflex   NECK :  without JVD/Nodes/TM/ nl carotid upstrokes bilaterally   LUNGS: no acc muscle use,  Nl contour chest which is clear to A and P bilaterally without cough on  insp or exp maneuvers   CV:  RRR  no s3 or murmur or increase in P2, and no edema   ABD:  soft and nontender with nl inspiratory excursion in the supine position. No bruits or organomegaly appreciated, bowel sounds nl  MS:  Nl gait/ ext warm without deformities, calf tenderness, cyanosis or clubbing No obvious joint restrictions   SKIN: warm and dry without lesions    NEURO:  alert, approp, nl sensorium  with  no motor or cerebellar deficits apparent.                   Assessment:

## 2017-02-14 NOTE — Assessment & Plan Note (Signed)
-  08/08/07 FOB with classic cobblestoning and MAI on culture.  - Rx 08/2007   To 08/2009 with ETH/Zmax - restarted empirically 05/31/14 > stopped 09/03/14 no benefit perceived, no change on cxr  - restart zmax daily 03/02/2016 > d/c 08/03/2016 > no change in symptoms or freq exac    No clinical evidence of dz progression > f/u wit pfts /and cxr in 3 m

## 2017-02-16 ENCOUNTER — Encounter: Payer: Self-pay | Admitting: Family Medicine

## 2017-02-16 ENCOUNTER — Ambulatory Visit (INDEPENDENT_AMBULATORY_CARE_PROVIDER_SITE_OTHER): Payer: Medicare Other | Admitting: Family Medicine

## 2017-02-16 VITALS — BP 108/78 | HR 68 | Temp 97.1°F | Ht 61.0 in | Wt 129.2 lb

## 2017-02-16 DIAGNOSIS — M85851 Other specified disorders of bone density and structure, right thigh: Secondary | ICD-10-CM

## 2017-02-16 DIAGNOSIS — E785 Hyperlipidemia, unspecified: Secondary | ICD-10-CM | POA: Diagnosis not present

## 2017-02-16 DIAGNOSIS — I2583 Coronary atherosclerosis due to lipid rich plaque: Secondary | ICD-10-CM | POA: Diagnosis not present

## 2017-02-16 DIAGNOSIS — E039 Hypothyroidism, unspecified: Secondary | ICD-10-CM | POA: Diagnosis not present

## 2017-02-16 DIAGNOSIS — R739 Hyperglycemia, unspecified: Secondary | ICD-10-CM

## 2017-02-16 DIAGNOSIS — I2510 Atherosclerotic heart disease of native coronary artery without angina pectoris: Secondary | ICD-10-CM

## 2017-02-16 DIAGNOSIS — G4700 Insomnia, unspecified: Secondary | ICD-10-CM

## 2017-02-16 DIAGNOSIS — M797 Fibromyalgia: Secondary | ICD-10-CM

## 2017-02-16 DIAGNOSIS — I10 Essential (primary) hypertension: Secondary | ICD-10-CM

## 2017-02-16 DIAGNOSIS — Z0000 Encounter for general adult medical examination without abnormal findings: Secondary | ICD-10-CM

## 2017-02-16 LAB — COMPREHENSIVE METABOLIC PANEL
ALBUMIN: 4.1 g/dL (ref 3.5–5.2)
ALK PHOS: 93 U/L (ref 39–117)
ALT: 18 U/L (ref 0–35)
AST: 25 U/L (ref 0–37)
BILIRUBIN TOTAL: 0.4 mg/dL (ref 0.2–1.2)
BUN: 16 mg/dL (ref 6–23)
CO2: 22 mEq/L (ref 19–32)
CREATININE: 0.78 mg/dL (ref 0.40–1.20)
Calcium: 9.4 mg/dL (ref 8.4–10.5)
Chloride: 103 mEq/L (ref 96–112)
GFR: 76.75 mL/min (ref >60.00)
Glucose, Bld: 105 mg/dL — ABNORMAL HIGH (ref 70–99)
Potassium: 5.2 mEq/L — ABNORMAL HIGH (ref 3.5–5.1)
SODIUM: 138 meq/L (ref 135–145)
TOTAL PROTEIN: 6.8 g/dL (ref 6.0–8.3)

## 2017-02-16 LAB — CBC
HEMATOCRIT: 41.4 % (ref 36.0–46.0)
Hemoglobin: 14 g/dL (ref 12.0–15.0)
MCHC: 33.8 g/dL (ref 30.0–36.0)
MCV: 95.1 fl (ref 78.0–100.0)
Platelets: 293 10*3/uL (ref 150.0–400.0)
RBC: 4.36 Mil/uL (ref 3.87–5.11)
RDW: 12.7 % (ref 11.5–15.5)
WBC: 4.7 10*3/uL (ref 4.0–10.5)

## 2017-02-16 LAB — POC URINALSYSI DIPSTICK (AUTOMATED)
BILIRUBIN UA: NEGATIVE
GLUCOSE UA: NEGATIVE
Ketones, UA: NEGATIVE
Leukocytes, UA: NEGATIVE
NITRITE UA: NEGATIVE
Protein, UA: NEGATIVE
RBC UA: NEGATIVE
Spec Grav, UA: 1.01 (ref 1.010–1.025)
Urobilinogen, UA: 0.2 E.U./dL
pH, UA: 6 (ref 5.0–8.0)

## 2017-02-16 LAB — LDL CHOLESTEROL, DIRECT: LDL DIRECT: 72 mg/dL

## 2017-02-16 LAB — TSH: TSH: 0.12 u[IU]/mL — ABNORMAL LOW (ref 0.35–4.50)

## 2017-02-16 LAB — HEMOGLOBIN A1C: HEMOGLOBIN A1C: 6.2 % (ref 4.6–6.5)

## 2017-02-16 NOTE — Progress Notes (Signed)
Phone: [phone]  Subjective:  Patient presents today for their annual physical. Chief complaint-noted.   See problem oriented charting- ROS- full  review of systems was completed and negative except for: cough, constipation, joint pain, myalgias, rash, bleeds easily on aspirin, sleep issues  The following were reviewed and entered/updated in epic: Past Medical History:  Diagnosis Date  . Bronchiectasis    oxygen at night in the past  . CAD (coronary artery disease)   . Diverticulitis 2014  . GERD (gastroesophageal reflux disease)   . History of shingles 04/2012  . HTN (hypertension)   . Hyperlipidemia   . Hypothyroidism   . MAI (mycobacterium avium-intracellulare) ([place])   . Neuritis of upper extremity    Patient Active Problem List   Diagnosis Date Noted  . Osteopenia 08/14/2011    Priority: High  . CAD (coronary artery disease) s/p CABG 07/03/2007    Priority: High  . Hyperglycemia 08/05/2014    Priority: Medium  . Insomnia 02/12/2013    Priority: Medium  . Nocturnal hypoxemia 12/12/2012    Priority: Medium  . Fibromyalgia 12/28/2011    Priority: Medium  . Hypothyroidism 04/06/2010    Priority: Medium  . MAI (mycobacterium avium-intracellulare) ([place]) 11/19/2009    Priority: Medium  . Hyperlipemia 07/03/2007    Priority: Medium  . Essential hypertension 07/03/2007    Priority: Medium  . Obstructive bronchiectasis ([place]) 07/03/2007    Priority: Medium  . Former smoker 08/05/2014    Priority: Low  . Constipation 05/02/2014    Priority: Low  . History of colonic polyps 05/02/2014    Priority: Low  . GERD (gastroesophageal reflux disease) 02/24/2014    Priority: Low  . Hemoptysis 02/24/2014    Priority: Low  . Benign paroxysmal positional vertigo 04/18/2013    Priority: Low  . Diverticulitis 12/21/2012    Priority: Low  . Cystitis 11/05/2016  . Cerebrovascular disease 01/31/2015   Past Surgical History:  Procedure Laterality Date  . CORONARY ARTERY  BYPASS GRAFT  2004   x3 CABG    Family History  Problem Relation Age of Onset  . Colon cancer Mother   . Hyperlipidemia Mother   . Hypertension Mother   . Heart disease Mother   . Stroke Mother   . Diabetes Mother   . Colon cancer Father   . Arthritis Father   . Hyperlipidemia Father   . Hypertension Father   . Heart disease Father   . Stroke Father   . Atopy Neg Hx     Medications- reviewed and updated Current Outpatient Medications  Medication Sig Dispense Refill  . acetaminophen (TYLENOL) 650 MG CR tablet every 8 (eight) hours as needed for pain.     Marland Kitchen albuterol (PROAIR HFA) 108 (90 Base) MCG/ACT inhaler Inhale 2 puffs into the lungs every 6 (six) hours as needed for wheezing or shortness of breath. 1 Inhaler 11  . ALPRAZolam (XANAX) 0.25 MG tablet take 1 tablet by mouth at bedtime if needed for sleep 60 tablet 4  . amitriptyline (ELAVIL) 50 MG tablet take 1 tablet by mouth at bedtime 90 tablet 3  . aspirin EC 81 MG tablet Take 81 mg by mouth every other day.    Marland Kitchen atorvastatin (LIPITOR) 80 MG tablet Take 1 tablet (80 mg total) by mouth daily. 90 tablet 3  . budesonide-formoterol (SYMBICORT) 80-4.5 MCG/ACT inhaler INHALE 2 PUFFS INTO THE LUNGS FIRST THING IN THE MORNING AND 2 PUFFS 12 HOURS LATER 3 Inhaler 3  . BYSTOLIC 2.5 MG  tablet take 1 tablet by mouth once daily 90 tablet 3  . Dextromethorphan-Guaifenesin (MUCINEX DM) 30-600 MG TB12 Take 1 tablet by mouth 2 (two) times daily as needed. Patient takes 1200mg     . LINZESS 290 MCG CAPS capsule   0  . lisinopril (PRINIVIL,ZESTRIL) 20 MG tablet Take 1 tablet (20 mg total) by mouth daily. 90 tablet 1  . metFORMIN (GLUCOPHAGE) 500 MG tablet Take 0.5 tablets (250 mg total) by mouth 2 (two) times daily with a meal. 90 tablet 3  . Respiratory Therapy Supplies (FLUTTER) DEVI Use as directed 1 each 0  . SYNTHROID 75 MCG tablet take 1 tablet by mouth 6 DAYS A WEEK THEN TAKE 1 AND 1/2  TABLETS ON THE 7TH DAY 95 tablet 3  . UNABLE TO  FIND Smart Vest  10Hz  40% - 71/2 mins 11Hz  40%- 7 1/2 mins    . cefdinir (OMNICEF) 300 MG capsule Take 1 capsule (300 mg total) by mouth 2 (two) times daily. (Patient not taking: Reported on 02/16/2017) 14 capsule 11   No current facility-administered medications for this visit.     Allergies-reviewed and updated Allergies  Allergen Reactions  . Bactrim [Sulfamethoxazole-Trimethoprim] Hives, Itching and Other (See Comments)    Bruised like areas on body  . Ciprofloxacin Other (See Comments)    Body aches  . Codeine Nausea Only    REACTION: nausea  . Erythromycin Nausea Only    REACTION: nausea  . Levofloxacin Other (See Comments)    REACTION: aches    Social History   Socioeconomic History  . Marital status: Single    Spouse name: None  . Number of children: None  . Years of education: None  . Highest education level: None  Social Needs  . Financial resource strain: None  . Food insecurity - worry: None  . Food insecurity - inability: None  . Transportation needs - medical: None  . Transportation needs - non-medical: None  Occupational History  . None  Tobacco Use  . Smoking status: Former Smoker    Packs/day: 1.00    Years: 20.00    Pack years: 20.00    Types: Cigarettes    Last attempt to quit: 01/25/1970    Years since quitting: 47.0  . Smokeless tobacco: Never Used  Substance and Sexual Activity  . Alcohol use: No  . Drug use: No  . Sexual activity: None  Other Topics Concern  . None  Social History Narrative   Family: Single never married, no children, cat and rehabs turtles and tortoise   Went to [employer] in [employer] and social work, some business courses at [employer], EMT for 6 years.    LIves alone. Completely independent.    Lives in retirement community.       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 Oropharynx normal Neck: no thyromegaly or cervical lymphdenopathy CV: RRR no murmurs rubs or gallops Lungs: bilateral Wheeze noted. No rhonchi or rales Abdomen: soft/nontender/nondistended/normal bowel sounds. No rebound or guarding.  Ext: no edema Skin:  warm, dry Neuro: grossly normal, moves all extremities, PERRLA  Assessment/Plan:  72 y.o. female presenting for annual physical.  Health Maintenance counseling: 1. Anticipatory guidance: Patient counseled regarding regular dental exams -q6 months, eye exams -yearly, wearing seatbelts.  2. Risk factor reduction:  Advised patient of need for regular exercise and diet rich and fruits and vegetables to reduce risk of heart attack and stroke. Exercise- has been down in winter months. We discussed importance of weight bearing exercise for her osteopenia. Diet-weight up slightly but that's ok for her- as long as blood sugar is ok. .  Wt Readings from Last 3 Encounters:  02/16/17 129 lb 3.2 oz (58.6 kg)  02/14/17 126 lb 3.2 oz (57.2 kg)  11/05/16 121 lb 12.8 oz (55.2 kg)  3. Immunizations/screenings/ancillary studies- discussed shingrix at pharmacy  Immunization History  Administered Date(s) Administered  . H1N1 01/02/2008  . Influenza Split 10/14/2010, 10/07/2011  . Influenza, High Dose Seasonal PF 11/05/2016  . Influenza,inj,Quad PF,6+ Mos 10/02/2012, 10/16/2015  . Influenza-Unspecified 09/25/2013, 10/15/2014  . Pneumococcal Conjugate-13 08/14/2013  . Pneumococcal Polysaccharide-23 10/29/2011  4. Cervical cancer screening- passed age based screening but still sees Teryl Lucy yearly with GYN 5. Breast cancer screening-  breast exam with GYN and mammogram 01/06/17 6. Colon cancer screening -  01/2013 with 5 year follow up due to family history 7. Skin cancer screening- Dr. Amy Martinique sees yearly. advised regular sunscreen use. Denies worrisome, changing, or new skin lesions.  8. Osteoporosis screening at 31- recent bone density- see discussion below  Status of chronic or acute concerns   Fibromyalgia- remains on amitriptyline 50mg  with tylenol as needed- no desire to change dose at this point  MAI and obstructive bronchiectasis- follows with pulmonary clinic. q3 months. She feels things are going well there- she is thankful for Dr. Gustavus Bryant care  Sparing linzess for constipation  Osteopenia Only takes vitamin D and essentially all areas evaluated on bone density were stable from 2016 to 2018. We opted with the stability to not start fosamax and repeat in 12/2018 or 01/2019  Essential hypertension HTN- controlled on bystolic 2.5mg , lisinopril 20mg - she wanted to come off of ARBs due to all the recalls. She may talk with him about changing this if develops coughing issues- luckily has been ok so far.   Insomnia Early awakening around 4 30 or 5 Am- takes 45 minutes to get back to sleep- sometimes cannot get to sleep. Goes to bed at 12- even if gets to bed earlier cannot sleep- common in family. Usually gets up at 8. Still taking xanax to help her get to sleep. When doesn't sleep well- has more mylagias from her fibromyalgia 0 see below  Hyperglycemia hyperglycemia on metformin 250mg  BID- update a1c with labs  Hypothyroidism Hypothyroidism- on synthroid 75 mcg, takes an extra half dose on sundays  CAD (coronary artery disease) s/p CABG CAD- follows with Dr. Steva Colder. CABG 2003. Compliant with aspirin and atorvastatin 80mg  with LDL goal under 70. No chest pain  Future Appointments  Date Time Provider [place]  04/14/2017 10:30 AM Lendon Colonel, NP [facility]  05/16/2017  9:00 AM [facility] None  05/16/2017 10:15 AM Tanda Rockers, MD  [facility] None  08/10/2017  3:00 PM Williemae Area, RN [facility]   1 year CPE but will have mid year AWV with cassie as well to check in  Lab/Order associations: Essential hypertension - Plan: LDL cholesterol, direct, CBC, Comprehensive metabolic panel, POCT Urinalysis Dipstick (Automated)  Hyperglycemia - Plan: Hemoglobin A1c  Hyperlipidemia, unspecified hyperlipidemia type - Plan: LDL cholesterol, direct, CBC, Comprehensive metabolic panel, POCT Urinalysis Dipstick (Automated)  Hypothyroidism, unspecified type - Plan: TSH  Return precautions advised.  Garret Reddish, MD

## 2017-02-16 NOTE — Patient Instructions (Addendum)
Trial melatonin right before bed- I would try to get a lower dose in the 1-5mg  range, honestly 1-2.5mg  is probably fine. Can take this with the xanax.   Can get shingrix at your pharmacy (new shingles shot which would increase your protection from 50% to 90% or so)  Target getting some more calcium in diet, regular weight bearing exercise, continue vitamin D- to help preserve your bones. We opted to repeat bone density in 2 years given how stable the bones were even though the risk of fracture is elevated.   Please stop by lab before you go

## 2017-02-16 NOTE — Assessment & Plan Note (Signed)
HTN- controlled on bystolic 2.5mg , lisinopril 20mg - she wanted to come off of ARBs due to all the recalls. She may talk with him about changing this if develops coughing issues- luckily has been ok so far.

## 2017-02-16 NOTE — Assessment & Plan Note (Signed)
hyperglycemia on metformin 250mg  BID- update a1c with labs

## 2017-02-16 NOTE — Assessment & Plan Note (Signed)
CAD- follows with Dr. Steva Colder. CABG 2003. Compliant with aspirin and atorvastatin 80mg  with LDL goal under 70. No chest pain

## 2017-02-16 NOTE — Assessment & Plan Note (Signed)
Only takes vitamin D and essentially all areas evaluated on bone density were stable from 2016 to 2018. We opted with the stability to not start fosamax and repeat in 12/2018 or 01/2019

## 2017-02-16 NOTE — Addendum Note (Signed)
Addended by: Frutoso Chase A on: 02/16/2017 10:18 AM   Modules accepted: Orders

## 2017-02-16 NOTE — Assessment & Plan Note (Signed)
Hypothyroidism- on synthroid 75 mcg, takes an extra half dose on sundays

## 2017-02-16 NOTE — Assessment & Plan Note (Signed)
Early awakening around 4 30 or 5 Am- takes 45 minutes to get back to sleep- sometimes cannot get to sleep. Goes to bed at 12- even if gets to bed earlier cannot sleep- common in family. Usually gets up at 8. Still taking xanax to help her get to sleep. When doesn't sleep well- has more mylagias from her fibromyalgia 0 see below

## 2017-02-17 ENCOUNTER — Other Ambulatory Visit: Payer: Self-pay

## 2017-02-17 ENCOUNTER — Telehealth: Payer: Self-pay | Admitting: Family Medicine

## 2017-02-17 ENCOUNTER — Encounter: Payer: Self-pay | Admitting: Family Medicine

## 2017-02-17 DIAGNOSIS — E039 Hypothyroidism, unspecified: Secondary | ICD-10-CM

## 2017-02-17 DIAGNOSIS — E875 Hyperkalemia: Secondary | ICD-10-CM

## 2017-02-17 NOTE — Telephone Encounter (Signed)
Orders were placed

## 2017-02-17 NOTE — Telephone Encounter (Signed)
Copied from [place] [id number]. Topic: Inquiry >> Feb 17, 2017 12:33 PM Cecelia Byars, NT wrote: Reason for CRM: Patient called said she is supposed to have labs done next week, there is no order placed

## 2017-02-17 NOTE — Telephone Encounter (Signed)
See note

## 2017-02-19 DIAGNOSIS — J449 Chronic obstructive pulmonary disease, unspecified: Secondary | ICD-10-CM | POA: Diagnosis not present

## 2017-02-19 DIAGNOSIS — J4550 Severe persistent asthma, uncomplicated: Secondary | ICD-10-CM | POA: Diagnosis not present

## 2017-02-19 DIAGNOSIS — J471 Bronchiectasis with (acute) exacerbation: Secondary | ICD-10-CM | POA: Diagnosis not present

## 2017-02-25 ENCOUNTER — Other Ambulatory Visit (INDEPENDENT_AMBULATORY_CARE_PROVIDER_SITE_OTHER): Payer: Medicare Other

## 2017-02-25 DIAGNOSIS — E039 Hypothyroidism, unspecified: Secondary | ICD-10-CM | POA: Diagnosis not present

## 2017-02-25 DIAGNOSIS — E875 Hyperkalemia: Secondary | ICD-10-CM | POA: Diagnosis not present

## 2017-02-25 LAB — BASIC METABOLIC PANEL
BUN: 16 mg/dL (ref 6–23)
CHLORIDE: 102 meq/L (ref 96–112)
CO2: 28 mEq/L (ref 19–32)
Calcium: 9.1 mg/dL (ref 8.4–10.5)
Creatinine, Ser: 0.83 mg/dL (ref 0.40–1.20)
GFR: 71.44 mL/min (ref >60.00)
Glucose, Bld: 109 mg/dL — ABNORMAL HIGH (ref 70–99)
POTASSIUM: 4 meq/L (ref 3.5–5.1)
SODIUM: 138 meq/L (ref 135–145)

## 2017-02-25 LAB — TSH: TSH: 0.3 u[IU]/mL — AB (ref 0.35–4.50)

## 2017-02-28 ENCOUNTER — Other Ambulatory Visit: Payer: Self-pay | Admitting: Family Medicine

## 2017-02-28 DIAGNOSIS — E039 Hypothyroidism, unspecified: Secondary | ICD-10-CM

## 2017-03-02 ENCOUNTER — Other Ambulatory Visit (HOSPITAL_COMMUNITY): Payer: Self-pay | Admitting: Physician Assistant

## 2017-03-02 DIAGNOSIS — K5792 Diverticulitis of intestine, part unspecified, without perforation or abscess without bleeding: Secondary | ICD-10-CM | POA: Diagnosis not present

## 2017-03-02 DIAGNOSIS — R1032 Left lower quadrant pain: Secondary | ICD-10-CM | POA: Diagnosis not present

## 2017-03-04 ENCOUNTER — Ambulatory Visit (HOSPITAL_COMMUNITY)
Admission: RE | Admit: 2017-03-04 | Discharge: 2017-03-04 | Disposition: A | Discharge status: Home / Self Care | Payer: Medicare Other | Source: Ambulatory Visit | Attending: Physician Assistant | Admitting: Physician Assistant

## 2017-03-04 DIAGNOSIS — J479 Bronchiectasis, uncomplicated: Secondary | ICD-10-CM | POA: Insufficient documentation

## 2017-03-04 DIAGNOSIS — I700 Atherosclerosis of aorta: Secondary | ICD-10-CM | POA: Diagnosis not present

## 2017-03-04 DIAGNOSIS — K5730 Diverticulosis of large intestine without perforation or abscess without bleeding: Secondary | ICD-10-CM | POA: Insufficient documentation

## 2017-03-04 DIAGNOSIS — R1032 Left lower quadrant pain: Secondary | ICD-10-CM

## 2017-03-04 DIAGNOSIS — Z8719 Personal history of other diseases of the digestive system: Secondary | ICD-10-CM | POA: Diagnosis present

## 2017-03-04 DIAGNOSIS — K5792 Diverticulitis of intestine, part unspecified, without perforation or abscess without bleeding: Secondary | ICD-10-CM

## 2017-03-04 DIAGNOSIS — R1110 Vomiting, unspecified: Secondary | ICD-10-CM | POA: Diagnosis not present

## 2017-03-04 DIAGNOSIS — R109 Unspecified abdominal pain: Secondary | ICD-10-CM | POA: Diagnosis not present

## 2017-03-04 MED ORDER — IOPAMIDOL (ISOVUE-300) INJECTION 61%
100.0000 mL | Freq: Once | INTRAVENOUS | Status: AC | PRN
  Administered 2017-03-04: 100 mL via INTRAVENOUS

## 2017-03-04 MED ORDER — IOPAMIDOL (ISOVUE-300) INJECTION 61%
INTRAVENOUS | Status: AC
  Filled 2017-03-04: qty 100

## 2017-03-09 DIAGNOSIS — K5904 Chronic idiopathic constipation: Secondary | ICD-10-CM | POA: Diagnosis not present

## 2017-03-28 ENCOUNTER — Other Ambulatory Visit: Payer: Self-pay | Admitting: Family Medicine

## 2017-04-11 ENCOUNTER — Telehealth: Payer: Self-pay | Admitting: Internal Medicine

## 2017-04-11 NOTE — Telephone Encounter (Signed)
Spoke with the pt  She is c/o increased cough with green sputum x 1 wk  She is coughing up a moderate amount of sputum  She states that she has also had aches and feels bad in general  Had temp of 100 a couple days ago, but no fever since then  MW gave her omnicef rx with prn refills for purulent sputum  She has not started on this yet  I rec that she get this filled and start ASAP  She verbalized understanding  Will send to Judson Roch to see if she has any further rec (note that pt has ov with pft pending for 05/16/17) Please advise thanks!

## 2017-04-11 NOTE — Telephone Encounter (Signed)
Spoke with the pt and notified of recs per SG  She verbalized understanding  She states will keep planned ov for 05/16/17 and call sooner if not improving

## 2017-04-11 NOTE — Telephone Encounter (Signed)
Agree with Carole Civil now and follow up appointment with Dr. Melvyn Novas in 2-4 weeks. Thanks so much.

## 2017-04-14 ENCOUNTER — Ambulatory Visit: Payer: Medicare Other | Admitting: Adult Health

## 2017-04-18 ENCOUNTER — Other Ambulatory Visit: Payer: Self-pay | Admitting: Cardiology

## 2017-04-19 NOTE — Progress Notes (Signed)
Cardiology Office Note   Date:  04/20/2017   ID:  BRYTNEY Day, DOB 05-11-1945, MRN [id number]  PCP:  Belinda Olp, MD  Cardiologist:  [facility]  Chief Complaint  Patient presents with  . Shortness of Breath  . Hypertension     History of Present Illness: Belinda Day is a 72 y.o. female who presents for ongoing assessment and management of coronary artery disease, with history of CABG in 2002, mild aortic insufficiency her echo in March 2017 with normal LVEF of 60%.  Other history includes hypertension, hyperlipidemia, and COPD along with hypothyroidism.  The patient was last seen by Dr. Stanford Breed on 10/04/2016 was without any cardiac complaints at that time.  The patient was continued on current medical therapy.  She comes today without any cardiac complaints.  She has recently recovered from bronchitis, also had multiple antibiotic placed to include amoxicillin, and Cephadyn 300 mg  CT scan of the abdomen and pelvis abdominal pain was ordered.  CT scan revealed lower chest: Bronchiectasis in the right lower lobe, atelectasis and pleural calcifications of the right lung base.  The patient's gallbladder and liver were normal, pancreas was normal, spleen was normal, he was found to have some small cysts in the right kidney the patient had a normal appendix no evidence of colonic wall thickening or pericolic infiltrative changes to suggest acute diverticulitis.  Stomach and bowel is otherwise normal found to have atherosclerotic calcifications in the aorta without aneurysm or adenopathy.  She comes today feeling well with exception of generalized fatigue having had frequent infections flu and bronchitis.  She denies chest pain, dizziness, or significant weakness.  She is medically compliant.  Past Medical History:  Diagnosis Date  . Bronchiectasis    oxygen at night in the past  . CAD (coronary artery disease)   . Diverticulitis 2014  . GERD (gastroesophageal reflux disease)     . History of shingles 04/2012  . HTN (hypertension)   . Hyperlipidemia   . Hypothyroidism   . MAI (mycobacterium avium-intracellulare) ([place])   . Neuritis of upper extremity     Past Surgical History:  Procedure Laterality Date  . CORONARY ARTERY BYPASS GRAFT  2004   x3 CABG     Current Outpatient Medications  Medication Sig Dispense Refill  . acetaminophen (TYLENOL) 650 MG CR tablet every 8 (eight) hours as needed for pain.     Marland Kitchen albuterol (PROAIR HFA) 108 (90 Base) MCG/ACT inhaler Inhale 2 puffs into the lungs every 6 (six) hours as needed for wheezing or shortness of breath. 1 Inhaler 11  . ALPRAZolam (XANAX) 0.25 MG tablet TAKE 1 TABLET BY MOUTH AT BEDTIME AS NEEDED FOR SLEEP 60 tablet 4  . amitriptyline (ELAVIL) 50 MG tablet take 1 tablet by mouth at bedtime 90 tablet 3  . aspirin EC 81 MG tablet Take 81 mg by mouth every other day.    Marland Kitchen atorvastatin (LIPITOR) 80 MG tablet Take 1 tablet (80 mg total) by mouth daily. 90 tablet 1  . budesonide-formoterol (SYMBICORT) 80-4.5 MCG/ACT inhaler INHALE 2 PUFFS INTO THE LUNGS FIRST THING IN THE MORNING AND 2 PUFFS 12 HOURS LATER 3 Inhaler 3  . cefdinir (OMNICEF) 300 MG capsule Take 1 capsule (300 mg total) by mouth 2 (two) times daily. 14 capsule 11  . Dextromethorphan-Guaifenesin (MUCINEX DM) 30-600 MG TB12 Take 1 tablet by mouth 2 (two) times daily as needed. Patient takes 1200mg     . LINZESS 290 MCG CAPS capsule   0  .  lisinopril (PRINIVIL,ZESTRIL) 20 MG tablet Take 1 tablet (20 mg total) by mouth daily. 90 tablet 1  . Melatonin 5 MG TABS Take 5 mg by mouth at bedtime.    . metFORMIN (GLUCOPHAGE) 500 MG tablet Take 0.5 tablets (250 mg total) by mouth 2 (two) times daily with a meal. 90 tablet 3  . nebivolol (BYSTOLIC) 2.5 MG tablet Take 1 tablet (2.5 mg total) by mouth daily. 90 tablet 1  . Respiratory Therapy Supplies (FLUTTER) DEVI Use as directed 1 each 0  . SYNTHROID 75 MCG tablet take 1 tablet by mouth 6 DAYS A WEEK THEN TAKE 1 AND  1/2  TABLETS ON THE 7TH DAY 95 tablet 3  . UNABLE TO FIND Smart Vest  10Hz  40% - 71/2 mins 11Hz  40%- 7 1/2 mins     No current facility-administered medications for this visit.     Allergies:   Bactrim [sulfamethoxazole-trimethoprim]; Ciprofloxacin; Codeine; Erythromycin; and Levofloxacin    Social History:  The patient  reports that she quit smoking about 47 years ago. Her smoking use included cigarettes. She has a 20.00 pack-year smoking history. She has never used smokeless tobacco. She reports that she does not drink alcohol or use drugs.   Family History:  The patient's family history includes Arthritis in her father; Colon cancer in her father and mother; Diabetes in her mother; Heart disease in her father and mother; Hyperlipidemia in her father and mother; Hypertension in her father and mother; Stroke in her father and mother.    ROS: All other systems are reviewed and negative. Unless otherwise mentioned in H&P    PHYSICAL EXAM: VS:  BP (!) 110/56 (BP Location: Left Arm, Patient Position: Sitting, Cuff Size: Normal)   Pulse 68   Ht 5\' 1"  (1.549 m)   Wt 128 lb (58.1 kg)   SpO2 97%   BMI 24.19 kg/m  , BMI Body mass index is 24.19 kg/m. GEN: Well nourished, well developed, in no acute distress  HEENT: normal  Neck: no JVD, carotid bruits, or masses Cardiac: RRR; no murmurs, rubs, or gallops,no edema  Respiratory: Bilateral expiratory wheezes, expiratory wheezes, with frequent coughing and rhonchi. GI: soft, nontender, nondistended, + BS MS: no deformity or atrophy  Skin: warm and dry, no rash Neuro:  Strength and sensation are intact Psych: euthymic mood, full affect   Recent Labs: 02/16/2017: ALT 18; Hemoglobin 14.0; Platelets 293.0 02/25/2017: BUN 16; Creatinine, Ser 0.83; Potassium 4.0; Sodium 138; TSH 0.30    Lipid Panel    Component Value Date/Time   CHOL 129 08/23/2016 0731   TRIG 89.0 08/23/2016 0731   HDL 58.40 08/23/2016 0731   CHOLHDL 2 08/23/2016 0731     VLDL 17.8 08/23/2016 0731   LDLCALC 53 08/23/2016 0731   LDLDIRECT 72.0 02/16/2017 1003      Wt Readings from Last 3 Encounters:  04/20/17 128 lb (58.1 kg)  02/16/17 129 lb 3.2 oz (58.6 kg)  02/14/17 126 lb 3.2 oz (57.2 kg)    Other studies Reviewed: Echocardiogram 2015-05-10  Left ventricle: The cavity size was normal. Wall thickness was   normal. Systolic function was normal. The estimated ejection   fraction was in the range of 60% to 65%. Wall motion was normal;   there were no regional wall motion abnormalities. Left   ventricular diastolic function parameters were normal. - Aortic valve: There was trivial regurgitation. - Pulmonary arteries: Systolic pressure was mildly increased. PA   peak pressure: 31 mm Hg (S).  Study  Highlights Stress Test 05/08/2015   The left ventricular ejection fraction is normal (55-65%).  Nuclear stress EF: 64%.  There was no ST segment deviation noted during stress.  The study is normal. no evidence of ischemia or infarction  This is a low risk study.   ASSESSMENT AND PLAN:  1.  Coronary artery disease: History of CABG in 2003.  She has had a recent stress test on 05/08/2015 which was low risk.  2.  Hypertension: Blood pressures currently well controlled.  We will not make any medication changes at this time.  She will continue by systolic 5 mg daily, lisinopril 20 mg daily  3  Hypercholesterolemia: Continues on statin therapy.  Follow-up labs are usually followed by PCP.  4.  Chronic bronchitis: She is to follow with Dr. Melvyn Novas, her pulmonologist for ongoing management.  She is due to see him next week.  Current medicines are reviewed at length with the patient today.    Labs/ tests ordered today include: None  Phill Myron. West Pugh, ANP, AACC   04/20/2017 4:22 PM    [facility] [address] [place], [place] [address] Phone: [phone]; Fax: [phone]

## 2017-04-20 ENCOUNTER — Ambulatory Visit: Payer: Medicare Other | Admitting: Adult Health

## 2017-04-20 ENCOUNTER — Encounter: Payer: Self-pay | Admitting: Adult Health

## 2017-04-20 VITALS — BP 110/56 | HR 68 | Ht 61.0 in | Wt 128.0 lb

## 2017-04-20 DIAGNOSIS — I2510 Atherosclerotic heart disease of native coronary artery without angina pectoris: Secondary | ICD-10-CM

## 2017-04-20 DIAGNOSIS — E7800 Pure hypercholesterolemia, unspecified: Secondary | ICD-10-CM | POA: Diagnosis not present

## 2017-04-20 DIAGNOSIS — I10 Essential (primary) hypertension: Secondary | ICD-10-CM | POA: Diagnosis not present

## 2017-04-20 MED ORDER — NEBIVOLOL HCL 2.5 MG PO TABS: 2.5000 mg | ORAL_TABLET | Freq: Every day | ORAL | 1 refills | Status: DC

## 2017-04-20 MED ORDER — LISINOPRIL 20 MG PO TABS: 20.0000 mg | ORAL_TABLET | Freq: Every day | ORAL | 1 refills | Status: DC

## 2017-04-20 MED ORDER — ATORVASTATIN CALCIUM 80 MG PO TABS: 80.0000 mg | ORAL_TABLET | Freq: Every day | ORAL | 1 refills | Status: DC

## 2017-04-20 NOTE — Patient Instructions (Signed)
Medication Instructions:  NO CHANGES- Your physician recommends that you continue on your current medications as directed. Please refer to the Current Medication list given to you today.  If you need a refill on your cardiac medications before your next appointment, please call your pharmacy.  Follow-Up: Your physician wants you to follow-up in: [place] should receive a reminder letter in the mail two months in advance. If you do not receive a letter, please call our office 08-2017 to schedule the 10-2017 follow-up appointment.   Thank you for choosing [facility] at [facility]!!

## 2017-05-03 ENCOUNTER — Other Ambulatory Visit (INDEPENDENT_AMBULATORY_CARE_PROVIDER_SITE_OTHER): Payer: Medicare Other

## 2017-05-03 DIAGNOSIS — E039 Hypothyroidism, unspecified: Secondary | ICD-10-CM

## 2017-05-03 LAB — TSH: TSH: 1.12 u[IU]/mL (ref 0.35–4.50)

## 2017-05-16 ENCOUNTER — Ambulatory Visit: Payer: Medicare Other | Admitting: Internal Medicine

## 2017-05-16 ENCOUNTER — Ambulatory Visit (INDEPENDENT_AMBULATORY_CARE_PROVIDER_SITE_OTHER)
Admission: RE | Admit: 2017-05-16 | Discharge: 2017-05-16 | Disposition: A | Discharge status: Home / Self Care | Payer: Medicare Other | Source: Ambulatory Visit | Attending: Internal Medicine | Admitting: Internal Medicine

## 2017-05-16 ENCOUNTER — Encounter: Payer: Self-pay | Admitting: Internal Medicine

## 2017-05-16 ENCOUNTER — Ambulatory Visit (INDEPENDENT_AMBULATORY_CARE_PROVIDER_SITE_OTHER): Payer: Medicare Other | Admitting: Internal Medicine

## 2017-05-16 ENCOUNTER — Other Ambulatory Visit (INDEPENDENT_AMBULATORY_CARE_PROVIDER_SITE_OTHER): Payer: Medicare Other

## 2017-05-16 VITALS — BP 126/76 | HR 68 | Ht 61.0 in | Wt 128.0 lb

## 2017-05-16 DIAGNOSIS — J479 Bronchiectasis, uncomplicated: Secondary | ICD-10-CM

## 2017-05-16 DIAGNOSIS — I10 Essential (primary) hypertension: Secondary | ICD-10-CM

## 2017-05-16 DIAGNOSIS — A310 Pulmonary mycobacterial infection: Secondary | ICD-10-CM

## 2017-05-16 DIAGNOSIS — J984 Other disorders of lung: Secondary | ICD-10-CM | POA: Diagnosis not present

## 2017-05-16 DIAGNOSIS — J449 Chronic obstructive pulmonary disease, unspecified: Secondary | ICD-10-CM | POA: Diagnosis not present

## 2017-05-16 LAB — PULMONARY FUNCTION TEST
DL/VA % pred: 181 %
DL/VA: 7.99 ml/min/mmHg/L
DLCO UNC % PRED: 246 %
DLCO UNC: 49.77 ml/min/mmHg
FEF 25-75 POST: 0.49 L/s
FEF 25-75 PRE: 0.37 L/s
FEF2575-%Change-Post: 32 %
FEF2575-%PRED-PRE: 24 %
FEF2575-%Pred-Post: 31 %
FEV1-%CHANGE-POST: 12 %
FEV1-%Pred-Post: 47 %
FEV1-%Pred-Pre: 42 %
FEV1-Post: 0.89 L
FEV1-Pre: 0.79 L
FEV1FVC-%Change-Post: 9 %
FEV1FVC-%PRED-PRE: 73 %
FEV6-%CHANGE-POST: 2 %
FEV6-%PRED-POST: 62 %
FEV6-%Pred-Pre: 60 %
FEV6-Post: 1.47 L
FEV6-Pre: 1.43 L
FEV6FVC-%CHANGE-POST: 0 %
FEV6FVC-%PRED-POST: 104 %
FEV6FVC-%Pred-Pre: 105 %
FVC-%Change-Post: 3 %
FVC-%Pred-Post: 59 %
FVC-%Pred-Pre: 57 %
FVC-Post: 1.47 L
FVC-Pre: 1.43 L
PRE FEV1/FVC RATIO: 55 %
PRE FEV6/FVC RATIO: 100 %
Post FEV1/FVC ratio: 60 %
Post FEV6/FVC ratio: 100 %
RV % pred: 132 %
RV: 2.8 L
TLC % PRED: 92 %
TLC: 4.25 L

## 2017-05-16 LAB — CBC WITH DIFFERENTIAL/PLATELET
BASOS ABS: 0 10*3/uL (ref 0.0–0.1)
BASOS PCT: 0.4 % (ref 0.0–3.0)
EOS ABS: 0.1 10*3/uL (ref 0.0–0.7)
Eosinophils Relative: 0.7 % (ref 0.0–5.0)
HEMATOCRIT: 42.5 % (ref 36.0–46.0)
HEMOGLOBIN: 14.4 g/dL (ref 12.0–15.0)
LYMPHS PCT: 18.9 % (ref 12.0–46.0)
Lymphs Abs: 1.4 10*3/uL (ref 0.7–4.0)
MCHC: 33.8 g/dL (ref 30.0–36.0)
MCV: 94.3 fl (ref 78.0–100.0)
MONOS PCT: 5.7 % (ref 3.0–12.0)
Monocytes Absolute: 0.4 10*3/uL (ref 0.1–1.0)
Neutro Abs: 5.7 10*3/uL (ref 1.4–7.7)
Neutrophils Relative %: 74.3 % (ref 43.0–77.0)
PLATELETS: 298 10*3/uL (ref 150.0–400.0)
RBC: 4.51 Mil/uL (ref 3.87–5.11)
RDW: 14 % (ref 11.5–15.5)
WBC: 7.6 10*3/uL (ref 4.0–10.5)

## 2017-05-16 MED ORDER — BUDESONIDE-FORMOTEROL FUMARATE 160-4.5 MCG/ACT IN AERO: INHALATION_SPRAY | RESPIRATORY_TRACT | 12 refills | Status: DC

## 2017-05-16 MED ORDER — TELMISARTAN 80 MG PO TABS: 80.0000 mg | ORAL_TABLET | Freq: Every day | ORAL | 11 refills | Status: DC

## 2017-05-16 MED ORDER — PREDNISONE 10 MG PO TABS: ORAL_TABLET | ORAL | 0 refills | Status: DC

## 2017-05-16 NOTE — Progress Notes (Signed)
Subjective:     Patient ID: Belinda Day, female   DOB: Feb 18, 1974    MRN: [id number]   Brief patient profile:  72  yowf quit smoking 1972 with documented right middle lobe  syndrome and evidence of bronchiectasis by CT scan in March 2002   History of Present Illness  08/08/07 FOB with classic cobblestoning and MAI on culture.   08/15/07 given Levaquin x 10 days with resolution bloody mucus, but "felt she had flu the whole time" with aches, feverish   August 29, 2007 ov: first post bronch co still coughing up mucus clear and initiate rx with symbicort/ [place]   October 19, 2007 ov no cough , sob, feeling great but no improvement on cxr   December 07, 2007 ov feeling great, minimal am cough not productive. No sob.   Opth eval, labs ok 10/2007   September 06, 2008 ov overall better over the last year, less tendency to exac on zmax and ethambutol. rec complete another year > satisfied improved 90% and stopped zmax and eth 08/2009     08/14/2015  f/u ov/Wert re:  obst bronchiectasis on symbicort 160 2bid  Chief Complaint  Patient presents with  . Follow-up    Cough still the same,coughed up bright red bld. on 2 occassions since last ov,lasted 1 day each-felt like sorethroat at the time,sob occass.,worse with humidity,denies cp or tightness,no fcs.Saw Dr. Stanford Breed last wk. everything was good.  just two episodes hemoptysis  x one month while on asa but the total may approach 2 tbsp in 24 h rec Hold aspirin anytime you notice more blood than usual  > stopped aspirin permanently For definite change in mucus > Zpak     10/16/2015  f/u ov/Wert re: obst bronchiectasis/ flare on symb 160 2bid and prn zpak Chief Complaint  Patient presents with  . Acute Visit    c/o coughing up bld.bright red 3 episodes since last ov. Took Z-pak each time. Last time finished was Sept. 14th. Not coughing up bld. now,no cough now. Off Aspirin.Shaky today.Using Albuterol 2-3 times a wk.Sob  same,occass. wheezing.Denies cp or tightness, No fcs.  no   Purulent sputum just blood, chest discomfort with cough but does not lateralize Never coughed up more than a few tbsp per day Not limited by breathing from desired activities   rec Change zithromax 250 mg daily and reduce the symbicort to 80 Take 2 puffs first thing in am and then another 2 puffs about 12 hours later.  When cough flares, cough into the flutter valve and use the mucinex up to 1200 mg every 12 hours as needed  If mucus turns nasty > omnicef 300 mg twice daily x 10 days  Please schedule a follow up office visit in 6 weeks (change the appointment)  lated add:  omnicef should be 7 days for now > called to correct       03/02/2016  f/u ov/Wert re:  obst bronchiectasis  Off zmax/ on symb 80 2bid  Chief Complaint  Patient presents with  . Follow-up    Cough is unchanged. She is using proair 3 x wkly on average. Last course of Omnicef was taken in mid Dec 2017.   did better on zmax with less cough / less am am cough / congestion worse in am x 2-3 tbsp thick clear mucus x sev hours but no noct cough  On mucinex 600 one twice daily max but confused with dosing and also  taking Rob dm  rec Continue on Azithromycin daily .  Continue on Mucinex and Flutter valve .  Prednisone taper over next week.  VEST order sent in .  Continue on Symbicort .     05/04/2016  f/u ov/Wert re: obst bronchiectasis on zmax 250 mg daily / symb 80 2bid and maybe twice weekly saba Chief Complaint  Patient presents with  . Follow-up    Doing well overall. She uses proair 2 x per wk as needed.   worse 1st thing in am x 3 hours maybe a cup and after supper repeats > mucus yellowish,thick not bloody /using flutter valve 4 x in 3hours with marginal benefit / already on daily zmax  rec For cough mucinex dm total is 1200 mg every 12 hours as needed  schedule VEST and use it at least 4 x daily but esp in am and when the cough starts back up in pm and  at bedtime      08/03/2016  f/u ov/Wert re: obst bronchiectasis now on flutter  Chief Complaint  Patient presents with  . Follow-up    Pt here today stating her breathing has improved, she states she uses her smart vest 1-2 times a day, she states the increase of the mucinex has helped, she states the symbicort has helped open her up, She is still coughing sometimes it is productive,slight wheezing, Denies chest tightness   after am treatment produces pale yellow much / still using the 1200 mucinex dm bid and flutter rec Ok to try off zmax and just use omnicef if flare  Continue with the flutter valve as much as possible    02/14/2017  f/u ov/Wert re: obst bronchiectasis  Chief Complaint  Patient presents with  . Follow-up    Breathing is overall doing well. She rarely uses rescue inhaler.    took zpak tgiving for uri x 3 weeks and completely resolved  Vest each am seems to help/ just clear mucus x sev tsp  Not needing flutter as much  Has not used cycle of omnicef since last ov  Not limited by breathing from desired activities   rec As needed for nasty mucus > omnicef 300 mg twice daily x 7 days [place] schedule a follow up visit in 3 months but call sooner if needed with pfts and cxr       05/16/2017  f/u ov/Wert re:   obst bronchiectasis/ gold III copd  Chief Complaint  Patient presents with  . Follow-up    PFT's done today.  Cough not improving since the last visit- prod with pale yellow sputum. She is using her proair once daily in the afternoons.   Dyspnea:  [employer] work is now a struggle  Cough: worse more in pm/ pale yellow , using vest bid and flutter and now on lisinopril  Sleep: well  SABA use:  More in afternoons than noc or in am  Overt hb on gerd rx while on acei   No obvious day to day or daytime variability or assoc  mucus plugs or hemoptysis or cp or chest tightness, subjective wheeze or overt sinus symptoms. No unusual exposure hx or h/o childhood pna/ asthma or  knowledge of premature birth.  Sleeping  Ok flat  without nocturnal  or early am exacerbation  of respiratory  c/o's or need for noct saba. Also denies any obvious fluctuation of symptoms with weather or environmental changes or other aggravating or alleviating factors except as outlined above  Current Allergies, Complete Past Medical History, Past Surgical History, Family History, and Social History were reviewed in [employer] record.  ROS  The following are not active complaints unless bolded Hoarseness, sore throat, dysphagia, dental problems, itching, sneezing,  nasal congestion or discharge of excess mucus or purulent secretions, ear ache,   fever, chills, sweats, unintended wt loss or wt gain, classically pleuritic or exertional cp,  orthopnea pnd or arm/hand swelling  or leg swelling, presyncope, palpitations, abdominal pain, anorexia, nausea, vomiting, diarrhea  or change in bowel habits or change in bladder habits, change in stools or change in urine, dysuria, hematuria,  rash, arthralgias, visual complaints, headache, numbness, weakness or ataxia or problems with walking or coordination,  change in mood or  memory.        Current Meds  Medication Sig  . acetaminophen (TYLENOL) 650 MG CR tablet every 8 (eight) hours as needed for pain.   Marland Kitchen albuterol (PROAIR HFA) 108 (90 Base) MCG/ACT inhaler Inhale 2 puffs into the lungs every 6 (six) hours as needed for wheezing or shortness of breath.  . ALPRAZolam (XANAX) 0.25 MG tablet TAKE 1 TABLET BY MOUTH AT BEDTIME AS NEEDED FOR SLEEP  . amitriptyline (ELAVIL) 50 MG tablet take 1 tablet by mouth at bedtime  . aspirin EC 81 MG tablet Take 81 mg by mouth every other day.  Marland Kitchen atorvastatin (LIPITOR) 80 MG tablet Take 1 tablet (80 mg total) by mouth daily.  Marland Kitchen Dextromethorphan-Guaifenesin (MUCINEX DM) 30-600 MG TB12 Take 2 tablets by mouth 2 (two) times daily. Patient takes 1200mg   . levothyroxine (SYNTHROID, LEVOTHROID) 75 MCG  tablet Take 75 mcg by mouth daily before breakfast.  . LINZESS 290 MCG CAPS capsule   . Melatonin 5 MG TABS Take 5 mg by mouth at bedtime.  . metFORMIN (GLUCOPHAGE) 500 MG tablet Take 0.5 tablets (250 mg total) by mouth 2 (two) times daily with a meal.  . nebivolol (BYSTOLIC) 2.5 MG tablet Take 1 tablet (2.5 mg total) by mouth daily.  Marland Kitchen Respiratory Therapy Supplies (FLUTTER) DEVI Use as directed  . UNABLE TO FIND Smart Vest  10Hz  40% - 71/2 mins 11Hz  40%- 7 1/2 mins  .   budesonide-formoterol (SYMBICORT) 80-4.5 MCG/ACT inhaler INHALE 2 PUFFS INTO THE LUNGS FIRST THING IN THE MORNING AND 2 PUFFS 12 HOURS LATER  .  lisinopril (PRINIVIL,ZESTRIL) 20 MG tablet Take 1 tablet (20 mg total) by mouth daily.                                   Past Medical History:  Bronchiectasis see CT SE 04/13/00  - HFA 75% November 19, 2009  - alpha one screen 01/14/2015 >  MM  - IgE 01/14/2015 = 11  MAI  - Rx Zmax and ETH 08/29/07 > 08/2009 restarted empirically 05/31/14 > 09/03/14 (no change in cough so just use zpak for flares)  - Rx zmax maint  10/16/2015 >>> d/c 08/03/2016  - Eye eval   10[place] try cycles of [place]ipro April 02, 2010  HEALTH MAINTENANCE...........................[name] - Td 10/2007  - Pneumovax 2005   and 10/29/2011 age 80, prevnar 08/16/2013  CAD  Hyperlipidemia  Hypertension  History of cough with ACE inhibition.  Gastroesophageal reflux disease         Objective:   Physical Exam   amb wf nad   Wt 130 October 19, 2007>140 March 31, 2010 > 127 07/16/2010 > 10/14/2010  120 > 05/04/2011  117 > 07/30/2011  120 > 10/29/2011 118 > 130  05/01/2012 > 12/12/2012 132 >  08/14/13 137 >    02/20/2014  137 >  05/31/2014 134 > 09/03/2014    140 > 10/15/2014 137 > 01/14/2015 137 >  05/15/2015 123 >08/14/2015  124 >  10/16/2015 126 > 03/02/2016   124  > 05/04/2016  126 > 08/03/2016   130 > 02/14/2017  126 > 05/16/2017 128    Vital signs reviewed - Note on  arrival 02 sats  100% on RA        05/16/2017 Pan exp rhonchi better with plm   HEENT: nl dentition, turbinates bilaterally, and oropharynx. Nl external ear canals without cough reflex   NECK :  without JVD/Nodes/TM/ nl carotid upstrokes bilaterally   LUNGS: no acc muscle use,  Nl contour chest with pan  exp rhonchi better with plm  bilaterally without cough on insp or exp maneuvers   CV:  RRR  no s3 or murmur or increase in P2, and no edema   ABD:  soft and nontender with nl inspiratory excursion in the supine position. No bruits or organomegaly appreciated, bowel sounds nl  MS:  Nl gait/ ext warm without deformities, calf tenderness, cyanosis or clubbing No obvious joint restrictions   SKIN: warm and dry without lesions    NEURO:  alert, approp, nl sensorium with  no motor or cerebellar deficits apparent.           CXR PA and Lateral:   05/16/2017 :    I personally reviewed images and agree with radiology impression as follows:     COPD with scarring, unchanged.  No superimposed acute abnormality.           Assessment:

## 2017-05-16 NOTE — Patient Instructions (Addendum)
Prednisone 10 mg take  4 each am x 2 days,   2 each am x 2 days,  1 each am x 2 days and stop   Stop lisinopril and start micardis 80 mg one half daily and call me if not affordable or not working well    Try increase the symbicort 160 Take 2 puffs first thing in am and then another 2 puffs about 12 hours later.   Work on maintaining perfect inhaler technique:  relax and gently blow all the way out then take a nice smooth deep breath back in, triggering the inhaler at same time you start breathing in.  Hold for up to 5 seconds if you can. Blow out thru nose. Rinse and gargle with water when done   Please remember to go to the lab and x-ray department downstairs in the basement  for your tests - we will call you with the results when they are available.     Please schedule a follow up visit in 3 months but call sooner if needed

## 2017-05-16 NOTE — Progress Notes (Signed)
PFT completed today 05/16/17

## 2017-05-16 NOTE — Progress Notes (Signed)
Spoke with pt and notified of results per Dr. Wert. Pt verbalized understanding and denied any questions. 

## 2017-05-17 LAB — IGG, IGA, IGM
IGG (IMMUNOGLOBIN G), SERUM: 922 mg/dL (ref 694–1618)
IGM, SERUM: 36 mg/dL — AB (ref 48–271)
IMMUNOGLOBULIN A: 179 mg/dL (ref 81–463)

## 2017-05-17 NOTE — Progress Notes (Signed)
Spoke with pt and notified of results per Dr. Wert. Pt verbalized understanding and denied any questions. 

## 2017-05-18 ENCOUNTER — Encounter: Payer: Self-pay | Admitting: Internal Medicine

## 2017-05-18 NOTE — Assessment & Plan Note (Addendum)
Followed in Pulmonary clinic/ [facility]/ [place] Frech.     - PFT's 10/14/2010  FEV1  1.25 (68%) and ratio 58% and DLCO 90%     - PFT's 10/29/2011  FEV1  1.33 (74%) and ratio 57 % and DLCO 93%    - PFTs 08/14/2013   FEV1  1.16 (60%) and ratio 61 with dlco 83%     - Flutter valve added 09/03/14      - alpha one   01/14/2015 >  MM, level 147     - IgE  01/14/15  11 - CT chest 04/17/15 Marked chronic bronchiectasis and volume loss in the right middle lobe with milder bronchiectasis, bronchial wall thickening, and nodular densities throughout the right upper and right lower lobe suggestive of chr onic endobronchial/atypical mycobacterial infection. - 10/16/2015 changed to symbicort 80 2bid (? Higher doses contributing to w MAI /freq of infections   - 05/04/2016 VEST stared around May 25 2016 - try off zmax 08/03/2016 > no change clinically as of 11/05/2016  - 02/14/2017 cycles of omnicef x 7 days prn purulent sputum   PFT's  05/16/2017  FEV1 0.89 (47 % ) ratio 60  p 12 % improvement from saba p nothing prior to study   - 05/16/2017  After extensive coaching inhaler device  effectiveness =    90%  - 05/16/17 quant Ig's ok x M slt low (not acutely ill at the time)     Clearly worse since last eval. DDX of  difficult airways management almost all start with A and  include Adherence, Ace Inhibitors, Acid Reflux, Active Sinus Disease, Alpha 1 Antitripsin deficiency, Anxiety masquerading as Airways dz,  ABPA,  Allergy(esp in young), Aspiration (esp in elderly), Adverse effects of meds,  Active smokers, A bunch of PE's (a small clot burden can't cause this syndrome unless there is already severe underlying pulm or vascular dz with poor reserve) plus two Bs  = Bronchiectasis and Beta blocker use..and one C= CHF   Adherence is always the initial "prime suspect" and is a multilayered concern that requires a "trust but verify" approach in every patient - starting with knowing how to use medications, especially inhalers,  correctly, keeping up with refills and understanding the fundamental difference between maintenance and prns vs those medications only taken for a very short course and then stopped and not refilled.  - see hfa teaching   ACEi adverse effects at the  top of the usual list of suspects and the only way to rule it out is a trial off > see a/p    ? Allergy/ asthma component > try continue singulair and  increase symb to 160 and Prednisone 10 mg take  4 each am x 2 days,   2 each am x 2 days,  1 each am x 2 days and stop    ? Active sinus dz > consider sinus ct next   Bronchiectasis > on max rx with flutter/ vest and prn omnicef and relatively well controlled clinically > no need for any strong prn abx for now  ? BB effect > very unlikely on such low doses of bisoprolol   ? Chf/ cardiac asthma > nothing to suggest on cxr or clinically    I had an extended discussion with the patient reviewing all relevant studies completed to date and  lasting 15 to 20 minutes of a 25 minute visit    Each maintenance medication was reviewed in detail including most importantly the difference between maintenance  and prns and under what circumstances the prns are to be triggered using an action plan format that is not reflected in the computer generated alphabetically organized AVS.    Please see AVS for specific instructions unique to this visit that I personally wrote and verbalized to the the pt in detail and then reviewed with pt  by my nurse highlighting any  changes in therapy recommended at today's visit to their plan of care.

## 2017-05-18 NOTE — Assessment & Plan Note (Signed)
In the best review of chronic cough to date ( NEJM 2016 375 [phone]) ,  ACEi are now felt to cause cough in up to  20% of pts which is a 4 fold increase from previous reports and does not include the variety of non-specific complaints we see in pulmonary clinic in pts on ACEi but previously attributed to another dx like  Copd/asthma and  include PNDS, throat and chest congestion, "bronchitis", unexplained dyspnea and noct "strangling" sensations, and hoarseness, but also  atypical /refractory GERD symptoms like dysphagia and "bad heartburn" while on gerd rx (which she clearly reports)   The only way I know  to prove this is not an "ACEi Case" is a trial off ACEi x a minimum of 6 weeks then regroup.   Try micardis 80 mg one half daily

## 2017-05-18 NOTE — Assessment & Plan Note (Signed)
-  08/08/07 FOB with classic cobblestoning and MAI on culture.  - Rx 08/2007   To 08/2009 with ETH/Zmax - restarted empirically 05/31/14 > stopped 09/03/14 no benefit perceived, no change on cxr  - restart zmax daily 03/02/2016 > d/c 08/03/2016 > no change in symptoms or freq exac   No evidence of clinical or radiographic progression so no need to cover mai here.

## 2017-06-09 ENCOUNTER — Other Ambulatory Visit: Payer: Self-pay | Admitting: Cardiology

## 2017-07-01 ENCOUNTER — Telehealth: Payer: Self-pay | Admitting: Internal Medicine

## 2017-07-01 MED ORDER — AZITHROMYCIN 250 MG PO TABS: 250.0000 mg | ORAL_TABLET | Freq: Every day | ORAL | 0 refills | Status: DC

## 2017-07-01 NOTE — Telephone Encounter (Signed)
Spoke with pt. She is aware of Dr. Gustavus Bryant response. Rx has been sent in. OV has been moved up to 08/02/17 at 12pm. Nothing further was needed.

## 2017-07-01 NOTE — Telephone Encounter (Signed)
Spoke with pt. States that she is not feeling well. Reports increased coughing with yellow mucus production. Denies any other symptoms such as chest tightness, wheezing or SOB. States that she was given a 30 day supply of Azithromycin last year for the same symptoms. Pt is wanting to know if she can have that again.  Dr. Melvyn Novas - please advise. Thanks!

## 2017-07-01 NOTE — Telephone Encounter (Signed)
Records indicate that didn't work very well and that we agreed to just do short courses of omnicef prn but if she wants to do the zmax for a month at 250 mg daily that's fine x 4 weeks and see me at the end of the 4 weeks to regroup

## 2017-08-02 ENCOUNTER — Encounter: Payer: Self-pay | Admitting: Internal Medicine

## 2017-08-02 ENCOUNTER — Ambulatory Visit: Payer: Medicare Other | Admitting: Internal Medicine

## 2017-08-02 VITALS — BP 132/66 | HR 69 | Ht 61.0 in | Wt 119.0 lb

## 2017-08-02 DIAGNOSIS — A310 Pulmonary mycobacterial infection: Secondary | ICD-10-CM

## 2017-08-02 DIAGNOSIS — I10 Essential (primary) hypertension: Secondary | ICD-10-CM

## 2017-08-02 DIAGNOSIS — J479 Bronchiectasis, uncomplicated: Secondary | ICD-10-CM | POA: Diagnosis not present

## 2017-08-02 MED ORDER — AZITHROMYCIN 250 MG PO TABS: 250.0000 mg | ORAL_TABLET | Freq: Every day | ORAL | 5 refills | Status: DC

## 2017-08-02 NOTE — Progress Notes (Addendum)
Subjective:     Patient ID: Belinda Day, female   DOB: June 29, 1945    MRN: [id number]   Brief patient profile:  72  yowf quit smoking 1972 with documented right middle lobe syndrome and evidence of bronchiectasis by CT scan in March 2002  And GOLD II criteria for copd 09/2010 / MM phenotype for alpha one   History of Present Illness  08/08/07 FOB with classic cobblestoning and MAI on culture.   08/15/07 given Levaquin x 10 days with resolution bloody mucus, but "felt she had flu the whole time" with aches, feverish   August 29, 2007 ov: first post bronch co still coughing up mucus clear and initiate rx with symbicort/ [place]   October 19, 2007 ov no cough , sob, feeling great but no improvement on cxr   December 07, 2007 ov feeling great, minimal am cough not productive. No sob.   Opth eval, labs ok 10/2007   September 06, 2008 ov overall better over the last year, less tendency to exac on zmax and ethambutol. rec complete another year > satisfied improved 90% and stopped zmax and eth 08/2009     08/14/2015  f/u ov/Wert re:  obst bronchiectasis on symbicort 160 2bid  Chief Complaint  Patient presents with  . Follow-up    Cough still the same,coughed up bright red bld. on 2 occassions since last ov,lasted 1 day each-felt like sorethroat at the time,sob occass.,worse with humidity,denies cp or tightness,no fcs.Saw Dr. Stanford Breed last wk. everything was good.  just two episodes hemoptysis  x one month while on asa but the total may approach 2 tbsp in 24 h rec Hold aspirin anytime you notice more blood than usual  > stopped aspirin permanently For definite change in mucus > Zpak     02/14/2017  f/u ov/Wert re: obst bronchiectasis  Chief Complaint  Patient presents with  . Follow-up    Breathing is overall doing well. She rarely uses rescue inhaler.    took zpak tgiving for uri x 3 weeks and completely resolved  Vest each am seems to help/ just clear mucus x sev tsp   Not needing flutter as much  Has not used cycle of omnicef since last ov  Not limited by breathing from desired activities   rec As needed for nasty mucus > omnicef 300 mg twice daily x 7 days [place] schedule a follow up visit in 3 months but call sooner if needed with pfts and cxr      05/16/2017  f/u ov/Wert re:   obst bronchiectasis/ gold III copd  Chief Complaint  Patient presents with  . Follow-up    PFT's done today.  Cough not improving since the last visit- prod with pale yellow sputum. She is using her proair once daily in the afternoons.   Dyspnea:  [employer] work is now a struggle  Cough: worse more in pm/ pale yellow , using vest bid and flutter and now on lisinopril  Sleep: well  SABA use:  More in afternoons than noc or in am  Overt hb on gerd rx while on acei  rec Prednisone 10 mg take  4 each am x 2 days,   2 each am x 2 days,  1 each am x 2 days and stop  Stop lisinopril and start micardis 80 mg one half daily and call me if not affordable or not working well  Try increase the symbicort 160 Take 2 puffs first thing in  am and then another 2 puffs about 12 hours later.  Work on maintaining perfect inhaler technique:      08/02/2017  f/u ov/Wert re:  Bronchiectasis/ GOLD III copd improved off acei and on symb 160 2bid / maint zmax Chief Complaint  Patient presents with  . Follow-up    just finished zithromax and cough is much better. She has not had to use her albuterol inhaler.    Dyspnea:  Able to do yard work, walking 15 min Cough: am produces one or tsp pale yellow less thick occ HB   SABA use: rare saba   No obvious day to day or daytime variability or assoc excess/ purulent sputum or mucus plugs or hemoptysis or cp or chest tightness, subjective wheeze or overt sinus   symptoms.   Sleeping one pillow  without nocturnal  or early am exacerbation  of respiratory  c/o's or need for noct saba. Also denies any obvious fluctuation of symptoms with weather or  environmental changes or other aggravating or alleviating factors except as outlined above   No unusual exposure hx or h/o childhood pna/ asthma or knowledge of premature birth.  Current Allergies, Complete Past Medical History, Past Surgical History, Family History, and Social History were reviewed in [employer] record.  ROS  The following are not active complaints unless bolded Hoarseness, sore throat, dysphagia, dental problems, itching, sneezing,  nasal congestion or discharge of excess mucus or purulent secretions, ear ache,   fever, chills, sweats, unintended wt loss or wt gain, classically pleuritic or exertional cp,  orthopnea pnd or arm/hand swelling  or leg swelling, presyncope, palpitations, abdominal pain, anorexia, nausea, vomiting, diarrhea  or change in bowel habits or change in bladder habits, change in stools or change in urine, dysuria, hematuria,  rash, arthralgias, visual complaints, headache, numbness, weakness or ataxia or problems with walking or coordination,  change in mood or  memory.        Current Meds  Medication Sig  . acetaminophen (TYLENOL) 650 MG CR tablet every 8 (eight) hours as needed for pain.   Marland Kitchen albuterol (PROAIR HFA) 108 (90 Base) MCG/ACT inhaler Inhale 2 puffs into the lungs every 6 (six) hours as needed for wheezing or shortness of breath.  . ALPRAZolam (XANAX) 0.25 MG tablet TAKE 1 TABLET BY MOUTH AT BEDTIME AS NEEDED FOR SLEEP  . amitriptyline (ELAVIL) 50 MG tablet take 1 tablet by mouth at bedtime  . aspirin EC 81 MG tablet Take 81 mg by mouth every other day.  Marland Kitchen atorvastatin (LIPITOR) 80 MG tablet Take 1 tablet (80 mg total) by mouth daily.  . budesonide-formoterol (SYMBICORT) 160-4.5 MCG/ACT inhaler Take 2 puffs first thing in am and then another 2 puffs about 12 hours later.  Marland Kitchen BYSTOLIC 2.5 MG tablet TAKE 1 TABLET BY MOUTH DAILY  . Dextromethorphan-Guaifenesin (MUCINEX DM) 30-600 MG TB12 Take 2 tablets by mouth 2 (two) times  daily. Patient takes 1200mg   . levothyroxine (SYNTHROID, LEVOTHROID) 75 MCG tablet Take 75 mcg by mouth daily before breakfast.  . LINZESS 290 MCG CAPS capsule   . Melatonin 5 MG TABS Take 5 mg by mouth at bedtime.  . metFORMIN (GLUCOPHAGE) 500 MG tablet Take 0.5 tablets (250 mg total) by mouth 2 (two) times daily with a meal.  . Respiratory Therapy Supplies (FLUTTER) DEVI Use as directed  . telmisartan (MICARDIS) 80 MG tablet Take 1 tablet (80 mg total) by mouth daily.  Marland Kitchen UNABLE TO FIND Smart Vest  10Hz   40% - 71/2 mins 11Hz  40%- 7 1/2 mins                      Past Medical History:  Bronchiectasis see CT SE 04/13/00  - HFA 75% November 19, 2009  - alpha one screen 01/14/2015 >  MM  - IgE 01/14/2015 = 11  MAI  - Rx Zmax and ETH 08/29/07 > 08/2009 restarted empirically 05/31/14 > 09/03/14 (no change in cough so just use zpak for flares)  - Rx zmax maint  10/16/2015 >>> d/c 08/03/2016 > restarted 05/16/17 and clinically improved 08/02/2017 using prn omnicef to supplment  - Eye eval   10/09.......................[place] so try cycles of cipro April 02, 2010  HEALTH MAINTENANCE...........................[name] - Td 10/2007  - Pneumovax 2005   and 10/29/2011 age 89, prevnar 08/16/2013  CAD  Hyperlipidemia  Hypertension  History of cough with ACE inhibition.  Gastroesophageal reflux disease         Objective:   Physical Exam   amb wf nad   Wt 130 October 19, 2007>140 March 31, 2010 > 127 07/16/2010 > 10/14/2010  120 > 05/04/2011  117 > 07/30/2011  120 > 10/29/2011 118 > 130  05/01/2012 > 12/12/2012 132 >  08/14/13 137 >    02/20/2014  137 >  05/31/2014 134 > 09/03/2014    140 > 10/15/2014 137 > 01/14/2015 137 >  05/15/2015 123 >08/14/2015  124 >  10/16/2015 126 > 03/02/2016   124  > 05/04/2016  126 > 08/03/2016   130 > 02/14/2017  126 > 05/16/2017 128 > 08/02/2017  119   Vital signs reviewed - Note on arrival 02 sats  97% on  RA        HEENT: nl dentition, turbinates bilaterally, and  oropharynx. Nl external ear canals without cough reflex   NECK :  without JVD/Nodes/TM/ nl carotid upstrokes bilaterally   LUNGS: no acc muscle use,  Nl contour chest with mild mid expiratory rhonchi bilaterally / better with plm  CV:  RRR  no s3 or murmur or increase in P2, and no edema   ABD:  soft and nontender with nl inspiratory excursion in the supine position. No bruits or organomegaly appreciated, bowel sounds nl  MS:  Nl gait/ ext warm without deformities, calf tenderness, cyanosis or clubbing No obvious joint restrictions   SKIN: warm and dry without lesions    NEURO:  alert, approp, nl sensorium with  no motor or cerebellar deficits apparent.         Assessment:

## 2017-08-02 NOTE — Patient Instructions (Signed)
No change in medications   Continue symb 160 Take 2 puffs first thing in am and then another 2 puffs about 12 hours later.   Only use your albuterol as a rescue medication to be used if you can't catch your breath by resting or doing a relaxed purse lip breathing pattern.  - The less you use it, the better it will work when you need it. - Ok to use up to 2 puffs  every 4 hours if you must but call for immediate appointment if use goes up over your usual need - Don't leave home without it !!  (think of it like the spare tire for your car)   Please schedule a follow up visit in 3 months but call sooner if needed

## 2017-08-03 ENCOUNTER — Encounter: Payer: Self-pay | Admitting: Internal Medicine

## 2017-08-03 NOTE — Assessment & Plan Note (Signed)
Try off acei 05/16/2017 due to cough > improved 08/02/2017   Adequate control on present rx, reviewed in detail with pt > no change in rx needed    Comment Although even in retrospect it may not be clear the ACEi contributed to the pt's symptoms,  Pt improved off them and adding them back at this point or in the future would risk confusion in interpretation of non-specific respiratory symptoms to which this patient is prone  ie  Better not to muddy the waters here.

## 2017-08-03 NOTE — Assessment & Plan Note (Signed)
-  08/08/07 FOB with classic cobblestoning and MAI on culture.  - Rx 08/2007   To 08/2009 with ETH/Zmax - restarted empirically 05/31/14 > stopped 09/03/14 no benefit perceived, no change on cxr  - restart zmax daily 03/02/2016 > d/c 08/03/2016 > no change in symptoms or freq exac  - -zmax daily restarted 05/16/17 and clinically improved 08/02/2017 using prn omnicef to supplment  Reviewed use of vest/flutter and strategy for prn omnicef for change in mucus but for now maint zmax daily for the next 3 months then regroup   I had an extended discussion with the patient reviewing all relevant studies completed to date and  lasting 15 to 20 minutes of a 25 minute visit    Each maintenance medication was reviewed in detail including most importantly the difference between maintenance and prns and under what circumstances the prns are to be triggered using an action plan format that is not reflected in the computer generated alphabetically organized AVS.    Please see AVS for specific instructions unique to this visit that I personally wrote and verbalized to the the pt in detail and then reviewed with pt  by my nurse highlighting any  changes in therapy recommended at today's visit to their plan of care.

## 2017-08-03 NOTE — Assessment & Plan Note (Addendum)
-   PFT's 10/14/2010  FEV1  1.25 (68%) and ratio 58% and DLCO 90%     - PFT's 10/29/2011  FEV1  1.33 (74%) and ratio 57 % and DLCO 93%    - PFTs 08/14/2013   FEV1  1.16 (60%) and ratio 61 with dlco 83%     - Flutter valve added 09/03/14      - alpha one   01/14/2015 >  MM, level 147     - IgE  01/14/15  11 - CT chest 04/17/15 Marked chronic bronchiectasis and volume loss in the right middle lobe with milder bronchiectasis, bronchial wall thickening, and nodular densities throughout the right upper and right lower lobe suggestive of chr onic endobronchial/atypical mycobacterial infection. - 10/16/2015 changed to symbicort 80 2bid (? Higher doses contributing to w MAI /freq of infections)   - 03/02/2016  After extensive coaching HFA effectiveness =    90%  - 05/04/2016 VEST stared around May 25 2016 - try off zmax 08/03/2016 > no change clinically as of 11/05/2016  - 02/14/2017 cycles of omnicef x 7 days prn purulent sputum   PFT's  05/16/2017  FEV1 0.89 (47 % ) ratio 60  p 12 % improvement from saba p nothing prior to study   - 05/16/2017  After extensive coaching inhaler device  effectiveness =    90%  - 05/16/17 quant Ig's ok x M slt low (not acutely ill at the time)  -zmax daily restarted 05/16/17 and clinically improved 08/02/2017 using prn omnicef to supplment    Marked clinical improvement off acei and on symb 160 2bid/ zmax as maint and prn omnicef > no change rx needed

## 2017-08-10 ENCOUNTER — Ambulatory Visit: Payer: Medicare Other | Admitting: *Deleted

## 2017-08-10 NOTE — Progress Notes (Signed)
Subjective:   Belinda Day is a 72 y.o. female who presents for Medicare Annual (Subsequent) preventive examination.  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  Colonoscopy 1/21015 and due 01/26/2018 Mammogram 12/2016  dexa 12/2016  -2.0   Educated on shingrix and she had a reaction   Cardiac Risk Factors include: advanced age (>52men, >38 women);diabetes mellitus;dyslipidemia;family history of premature cardiovascular disease;hypertension     Objective:     Vitals: BP 124/60   Pulse 63   Ht 5\' 1"  (1.549 m)   Wt 120 lb 8 oz (54.7 kg)   SpO2 96%   BMI 22.77 kg/m   Body mass index is 22.77 kg/m.  Advanced Directives 08/09/2016 04/17/2015 12/21/2012  Does Patient Have a Medical Advance Directive? Yes Yes Patient has advance directive, copy not in chart  Type of Advance Directive [place];Living will [place];Living will [place];Living will  Does patient want to make changes to medical advance directive? No - Patient declined No - Patient declined No  Copy of Healthcare Power of Attorney in Chart? No - copy requested No - copy requested Copy requested from family  Pre-existing out of facility DNR order (yellow form or pink MOST form) - - No    Tobacco Social History   Tobacco Use  Smoking Status Former Smoker  .  Packs/day: 1.00  . Years: 20.00  . Pack years: 20.00  . Types: Cigarettes  . Last attempt to quit: 01/25/1970  . Years since quitting: 47.5  Smokeless Tobacco Never Used     Counseling given: Yes   Clinical Intake:     Past Medical History:  Diagnosis Date  . Bronchiectasis    oxygen at night in the past  . CAD (coronary artery disease)   . Diverticulitis 2014  . GERD (gastroesophageal reflux disease)   . History of shingles 04/2012  . HTN (hypertension)   . Hyperlipidemia   . Hypothyroidism   . MAI (mycobacterium avium-intracellulare) ([place])   . Neuritis of upper extremity    Past Surgical History:  Procedure Laterality Date  . CORONARY ARTERY BYPASS GRAFT  2004   x3 CABG   Family History  Problem Relation Age of Onset  . Colon cancer Mother   . Hyperlipidemia Mother   . Hypertension Mother   . Heart disease Mother   . Stroke Mother   . Diabetes Mother   . Colon cancer Father   . Arthritis Father   . Hyperlipidemia Father   . Hypertension Father   . Heart disease Father   . Stroke Father   . Atopy Neg Hx    Social History   Socioeconomic History  . Marital status: Single    Spouse name: Not on file  . Number of children: Not on file  . Years  of education: Not on file  . Highest education level: Not on file  Occupational History  . Not on file  Social Needs  . Financial resource strain: Not on file  . Food insecurity:    Worry: Not on file    Inability: Not on file  . Transportation needs:    Medical: Not on file    Non-medical: Not on file  Tobacco Use  . Smoking status: Former Smoker    Packs/day: 1.00    Years: 20.00    Pack years: 20.00    Types: Cigarettes    Last attempt to quit: 01/25/1970    Years since quitting: 47.5  . Smokeless tobacco: Never Used  Substance and Sexual Activity  . Alcohol use: No  . Drug use: No  . Sexual activity: Not on file  Lifestyle  . Physical activity:    Days per week: Not on file    Minutes per session:  Not on file  . Stress: Not on file  Relationships  . Social connections:    Talks on phone: Not on file    Gets together: Not on file    Attends religious service: Not on file    Active member of club or organization: Not on file    Attends meetings of clubs or organizations: Not on file    Relationship status: Not on file  Other Topics Concern  . Not on file  Social History Narrative   Family: Single never married, no children, cat and rehabs turtles and tortoise   Went to [employer] in [employer] and social work, some business courses at [employer], EMT for 6 years.    LIves alone. Completely independent.    Lives in retirement community.       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 acetaminophen (TYLENOL) 650 MG CR tablet every 8 (eight) hours as needed for pain.   Marland Kitchen albuterol (PROAIR HFA) 108 (90 Base) MCG/ACT inhaler Inhale 2 puffs into the lungs every 6 (six) hours as needed for wheezing or shortness of breath.  . ALPRAZolam (XANAX) 0.25 MG tablet TAKE 1 TABLET BY MOUTH AT BEDTIME AS NEEDED FOR SLEEP  . amitriptyline (ELAVIL) 50 MG tablet take 1 tablet by mouth at bedtime  . aspirin EC 81 MG tablet Take 81 mg by mouth every other day.  Marland Kitchen atorvastatin (LIPITOR) 80 MG tablet Take 1 tablet (80 mg total) by mouth daily.  . budesonide-formoterol (SYMBICORT) 160-4.5 MCG/ACT inhaler Take 2 puffs first thing in am and then another 2 puffs about 12 hours later.  Marland Kitchen BYSTOLIC 2.5 MG tablet TAKE 1 TABLET BY MOUTH DAILY  . cefdinir (OMNICEF) 300 MG capsule Take 1 capsule (300 mg total) by mouth 2 (two) times daily.  Marland Kitchen Dextromethorphan-Guaifenesin (MUCINEX DM) 30-600 MG TB12 Take 2 tablets by mouth 2 (two) times daily. Patient takes 1200mg   . levothyroxine (SYNTHROID, LEVOTHROID) 75 MCG tablet Take 75 mcg by mouth daily  before breakfast.  . LINZESS 290 MCG CAPS capsule   . Melatonin 5 MG TABS Take 5 mg by mouth at bedtime.  . metFORMIN (GLUCOPHAGE) 500 MG tablet Take 0.5 tablets (250 mg total) by mouth 2 (two) times daily with a meal.  . Respiratory Therapy Supplies (FLUTTER) DEVI Use as directed  . telmisartan (MICARDIS) 80 MG tablet Take 1 tablet (80  mg total) by mouth daily.  Marland Kitchen UNABLE TO FIND Smart Vest  10Hz  40% - 71/2 mins 11Hz  40%- 7 1/2 mins  . [DISCONTINUED] azithromycin (ZITHROMAX) 250 MG tablet Take 1 tablet (250 mg total) by mouth daily.   No facility-administered encounter medications on file as of 08/11/2017.     Activities of Daily Living In your present state of health, do you have any difficulty performing the following activities: 08/11/2017  Hearing? N  Vision? N  Difficulty concentrating or making decisions? N  Walking or climbing stairs? N  Dressing or bathing? N  Doing errands, shopping? N  Preparing Food and eating ? N  Using the Toilet? N  In the past six months, have you accidently leaked urine? N  Do you have problems with loss of bowel control? N  Comment normally has constipation and taking linzess  Managing your Medications? N  Managing your Finances? N  Housekeeping or managing your Housekeeping? N  Some recent data might be hidden    Patient Care Team: Marin Olp, MD as PCP - General (Family Medicine) Stanford Breed Denice Bors, MD as PCP - Cardiology (Cardiology)    Assessment:   This is a routine wellness examination for Belinda Day.  Exercise Activities and Dietary recommendations Current Exercise Habits: Home exercise routine, Type of exercise: Other - see comments(works in the year every day in the am), Time (Minutes): 30, Frequency (Times/Week): 5, Weekly Exercise (Minutes/Week): 150, Intensity: Moderate  Goals    . Patient Stated     Keep weight steady and try to improve her diet More fruits and vegetables Try to add some fiber        Fall Risk Fall  Risk  08/11/2017 08/09/2016 04/17/2015 01/30/2014  Falls in the past year? No No No No    Depression Screen PHQ 2/9 Scores 08/11/2017 08/09/2016 04/17/2015 01/30/2014  PHQ - 2 Score 0 0 0 0     Cognitive Function MMSE - Mini Mental State Exam 08/11/2017  Not completed: (No Data)   Ad8 score reviewed for issues:  Issues making decisions:  Less interest in hobbies / activities:  Repeats questions, stories (family complaining):  Trouble using ordinary gadgets (microwave, computer, phone):  Forgets the month or year:   Mismanaging finances:   Remembering appts:  Daily problems with thinking and/or memory: Ad8 score is=0 No failure of independent living          Immunization History  Administered Date(s) Administered  . H1N1 01/02/2008  . Influenza Split 10/14/2010, 10/07/2011  . Influenza, High Dose Seasonal PF 11/05/2016  . Influenza,inj,Quad PF,6+ Mos 10/02/2012, 10/16/2015  . Influenza-Unspecified 09/25/2013, 10/15/2014  . Pneumococcal Conjugate-13 08/14/2013  . Pneumococcal Polysaccharide-23 10/29/2011      Screening Tests Health Maintenance  Topic Date Due  . INFLUENZA VACCINE  08/25/2017  . COLONOSCOPY  01/26/2018  . MAMMOGRAM  12/28/2018  . TETANUS/TDAP  04/06/2019  . DEXA SCAN  Completed  . Hepatitis C Screening  Completed  . PNA vac Low Risk Adult  Completed         Plan:      PCP Notes   Health Maintenance Colonoscopy 1/21015 and due 01/26/2018 Mammogram 12/2016  dexa 12/2016  -2.0   Educated on shingrix and she had a reaction  Will discuss with Dr. Yong Channel   Educated regarding osteoporosis foundation for check on how much calcium she is getting in her foods   Abnormal Screens  none  Referrals  none  Patient concerns; Educated on shingrix and  she had a reaction Red arm with heat x 1 week and has flu symptoms x 2 days   Had it at the end of June  Recommended she take the second further out and wait 4 to 5 months form her 1st  injection  Need refills on metformin, Synthorid (taking one per day) Amitriptyline But is due her 6 month apt and will schedule today and can refill then  (eating ice cream at hs so she is concerned with A1c )   Nurse Concerns; As noted  Next PCP apt To schedule her 6 months review      I have personally reviewed and noted the following in the patient's chart:   . Medical and social history . Use of alcohol, tobacco or illicit drugs  . Current medications and supplements . Functional ability and status . Nutritional status . Physical activity . Advanced directives . List of other physicians . Hospitalizations, surgeries, and ER visits in previous 12 months . Vitals . Screenings to include cognitive, depression, and falls . Referrals and appointments  In addition, I have reviewed and discussed with patient certain preventive protocols, quality metrics, and best practice recommendations. A written personalized care plan for preventive services as well as general preventive health recommendations were provided to patient.     Wynetta Fines, RN  08/11/2017

## 2017-08-11 ENCOUNTER — Ambulatory Visit (INDEPENDENT_AMBULATORY_CARE_PROVIDER_SITE_OTHER): Payer: Medicare Other | Admitting: *Deleted

## 2017-08-11 VITALS — BP 124/60 | HR 63 | Ht 61.0 in | Wt 120.5 lb

## 2017-08-11 DIAGNOSIS — Z0000 Encounter for general adult medical examination without abnormal findings: Secondary | ICD-10-CM

## 2017-08-11 NOTE — Patient Instructions (Addendum)
Ms. Downum , Thank you for taking time to come for your Medicare Wellness Visit. I appreciate your ongoing commitment to your health goals. Please review the following plan we discussed and let me know if I can assist you in the future.   Please make a fup apt with Dr. Yong Channel after 7/23  Will refill your meds at that time after blood test   Has first vaccine x 2 months ago. Had arm swelling and flu like symptoms   There is a tinnitus foundation online you and review  [email or web address]  We have a tetanus due in 2021, but if you take one, take the one with whooping cough in it, the tddap (has pertussis )    Calcium 1264m with Vit D 800u per day; more as directed by physician Strength building exercises discussed; can include walking; housework; small weights or stretch bands; silver sneakers if access to the [facility]  Please visit the osteoporosis [email or web address] for up to date recommendations They have a list of foods with calcium      These are the goals we discussed: Goals    . Patient Stated     Keep weight steady and try to improve her diet More fruits and vegetables Try to add some fiber        This is a list of the screening recommended for you and due dates:  Health Maintenance  Topic Date Due  . Flu Shot  08/25/2017  . Colon Cancer Screening  01/26/2018  . Mammogram  12/28/2018  . Tetanus Vaccine  04/06/2019  . DEXA scan (bone density measurement)  Completed  .  Hepatitis C: One time screening is recommended by Center for Disease Control  (CDC) for  adults born from [birth year]through 1965.   Completed  . Pneumonia vaccines  Completed   ' Prevention of falls: Remove rugs or any tripping hazards in the home Use Non slip mats in bathtubs and showers Placing grab bars next to the toilet and or shower Placing handrails on both sides of the stair way Adding extra lighting in the home.   Personal safety issues reviewed:  1. Consider starting a community watch program per  [facility].  Changes batteries is smoke detector and/or carbon monoxide detector  3.  If you have firearms; keep them in a safe place 4.  Wear protection when in the sun; Always wear sunscreen or a hat; It is good to have your doctor check your skin annually or review any new areas of concern See dermatologist every year  5. Driving safety; Keep in the right lane; stay 3 car lengths behind the car in front of you on the highway; look 3 times prior to pulling out; carry your cell phone everywhere you go!     Fall Prevention in the Home Falls can cause injuries. They can happen to people of all ages. There are many things you can do to make your home safe and to help prevent falls. What can I do on the outside of my home?  Regularly fix the edges of walkways and driveways and fix any cracks.  Remove anything that might make you trip as you walk through a door, such as a raised step or threshold.  Trim any bushes or trees on the path to your home.  Use bright outdoor lighting.  Clear any walking paths of anything that might make someone trip, such as rocks or tools.  Regularly check to see if handrails are loose or  broken. Make sure that both sides of any steps have handrails.  Any raised decks and porches should have guardrails on the edges.  Have any leaves, snow, or ice cleared regularly.  Use sand or salt on walking paths during winter.  Clean up any spills in your garage right away. This includes oil or grease spills. What can I do in the bathroom?  Use night lights.  Install grab bars by the toilet and in the tub and shower. Do not use towel bars as grab bars.  Use non-skid mats or decals in the tub or shower.  If you need to sit down in the shower, use a plastic, non-slip stool.  Keep the floor dry. Clean up any water that spills on the floor as soon as it happens.  Remove soap buildup in the tub or shower regularly.  Attach bath mats securely with  double-sided non-slip rug tape.  Do not have throw rugs and other things on the floor that can make you trip. What can I do in the bedroom?  Use night lights.  Make sure that you have a light by your bed that is easy to reach.  Do not use any sheets or blankets that are too big for your bed. They should not hang down onto the floor.  Have a firm chair that has side arms. You can use this for support while you get dressed.  Do not have throw rugs and other things on the floor that can make you trip. What can I do in the kitchen?  Clean up any spills right away.  Avoid walking on wet floors.  Keep items that you use a lot in easy-to-reach places.  If you need to reach something above you, use a strong step stool that has a grab bar.  Keep electrical cords out of the way.  Do not use floor polish or wax that makes floors slippery. If you must use wax, use non-skid floor wax.  Do not have throw rugs and other things on the floor that can make you trip. What can I do with my stairs?  Do not leave any items on the stairs.  Make sure that there are handrails on both sides of the stairs and use them. Fix handrails that are broken or loose. Make sure that handrails are as long as the stairways.  Check any carpeting to make sure that it is firmly attached to the stairs. Fix any carpet that is loose or worn.  Avoid having throw rugs at the top or bottom of the stairs. If you do have throw rugs, attach them to the floor with carpet tape.  Make sure that you have a light switch at the top of the stairs and the bottom of the stairs. If you do not have them, ask someone to add them for you. What else can I do to help prevent falls?  Wear shoes that: ? Do not have high heels. ? Have rubber bottoms. ? Are comfortable and fit you well. ? Are closed at the toe. Do not wear sandals.  If you use a stepladder: ? Make sure that it is fully opened. Do not climb a closed stepladder. ? Make  sure that both sides of the stepladder are locked into place. ? Ask someone to hold it for you, if possible.  Clearly mark and make sure that you can see: ? Any grab bars or handrails. ? First and last steps. ? Where the edge of each step  is.  Use tools that help you move around (mobility aids) if they are needed. These include: ? Canes. ? Walkers. ? Scooters. ? Crutches.  Turn on the lights when you go into a dark area. Replace any light bulbs as soon as they burn out.  Set up your furniture so you have a clear path. Avoid moving your furniture around.  If any of your floors are uneven, fix them.  If there are any pets around you, be aware of where they are.  Review your medicines with your doctor. Some medicines can make you feel dizzy. This can increase your chance of falling. Ask your doctor what other things that you can do to help prevent falls. This information is not intended to replace advice given to you by your health care provider. Make sure you discuss any questions you have with your health care provider. Document Released: 11/07/2008 Document Revised: 06/19/2015 Document Reviewed: 02/15/2014 Elsevier Interactive Patient Education  2018 [place] Maintenance, Female Adopting a healthy lifestyle and getting preventive care can go a long way to promote health and wellness. Talk with your health care provider about what schedule of regular examinations is right for you. This is a good chance for you to check in with your provider about disease prevention and staying healthy. In between checkups, there are plenty of things you can do on your own. Experts have done a lot of research about which lifestyle changes and preventive measures are most likely to keep you healthy. Ask your health care provider for more information. Weight and diet Eat a healthy diet  Be sure to include plenty of vegetables, fruits, low-fat dairy products, and lean protein.  Do not eat  a lot of foods high in solid fats, added sugars, or salt.  Get regular exercise. This is one of the most important things you can do for your health. ? Most adults should exercise for at least 150 minutes each week. The exercise should increase your heart rate and make you sweat (moderate-intensity exercise). ? Most adults should also do strengthening exercises at least twice a week. This is in addition to the moderate-intensity exercise.  Maintain a healthy weight  Body mass index (BMI) is a measurement that can be used to identify possible weight problems. It estimates body fat based on height and weight. Your health care provider can help determine your BMI and help you achieve or maintain a healthy weight.  For females 71 years of age and older: ? A BMI below 18.5 is considered underweight. ? A BMI of 18.5 to 24.9 is normal. ? A BMI of 25 to 29.9 is considered overweight. ? A BMI of 30 and above is considered obese.  Watch levels of cholesterol and blood lipids  You should start having your blood tested for lipids and cholesterol at 74 years of age, then have this test every 5 years.  You may need to have your cholesterol levels checked more often if: ? Your lipid or cholesterol levels are high. ? You are older than 74 years of age. ? You are at high risk for heart disease.  Cancer screening Lung Cancer  Lung cancer screening is recommended for adults 63-26 years old who are at high risk for lung cancer because of a history of smoking.  A yearly low-dose CT scan of the lungs is recommended for people who: ? Currently smoke. ? Have quit within the past 15 years. ? Have at least a 30-pack-year history of  smoking. A pack year is smoking an average of one pack of cigarettes a day for 1 year.  Yearly screening should continue until it has been 15 years since you quit.  Yearly screening should stop if you develop a health problem that would prevent you from having lung cancer  treatment.  Breast Cancer  Practice breast self-awareness. This means understanding how your breasts normally appear and feel.  It also means doing regular breast self-exams. Let your health care provider know about any changes, no matter how small.  If you are in your 20s or 30s, you should have a clinical breast exam (CBE) by a health care provider every 1-3 years as part of a regular health exam.  If you are 53 or older, have a CBE every year. Also consider having a breast X-ray (mammogram) every year.  If you have a family history of breast cancer, talk to your health care provider about genetic screening.  If you are at high risk for breast cancer, talk to your health care provider about having an MRI and a mammogram every year.  Breast cancer gene (BRCA) assessment is recommended for women who have family members with BRCA-related cancers. BRCA-related cancers include: ? Breast. ? Ovarian. ? Tubal. ? Peritoneal cancers.  Results of the assessment will determine the need for genetic counseling and BRCA1 and BRCA2 testing.  Cervical Cancer Your health care provider may recommend that you be screened regularly for cancer of the pelvic organs (ovaries, uterus, and vagina). This screening involves a pelvic examination, including checking for microscopic changes to the surface of your cervix (Pap test). You may be encouraged to have this screening done every 3 years, beginning at age 51.  For women ages 27-65, health care providers may recommend pelvic exams and Pap testing every 3 years, or they may recommend the Pap and pelvic exam, combined with testing for human papilloma virus (HPV), every 5 years. Some types of HPV increase your risk of cervical cancer. Testing for HPV may also be done on women of any age with unclear Pap test results.  Other health care providers may not recommend any screening for nonpregnant women who are considered low risk for pelvic cancer and who do not have  symptoms. Ask your health care provider if a screening pelvic exam is right for you.  If you have had past treatment for cervical cancer or a condition that could lead to cancer, you need Pap tests and screening for cancer for at least 20 years after your treatment. If Pap tests have been discontinued, your risk factors (such as having a new sexual partner) need to be reassessed to determine if screening should resume. Some women have medical problems that increase the chance of getting cervical cancer. In these cases, your health care provider may recommend more frequent screening and Pap tests.  Colorectal Cancer  This type of cancer can be detected and often prevented.  Routine colorectal cancer screening usually begins at 74 years of age and continues through 74 years of age.  Your health care provider may recommend screening at an earlier age if you have risk factors for colon cancer.  Your health care provider may also recommend using home test kits to check for hidden blood in the stool.  A small camera at the end of a tube can be used to examine your colon directly (sigmoidoscopy or colonoscopy). This is done to check for the earliest forms of colorectal cancer.  Routine screening usually begins at  age 36.  Direct examination of the colon should be repeated every 5-10 years through 74 years of age. However, you may need to be screened more often if early forms of precancerous polyps or small growths are found.  Skin Cancer  Check your skin from head to toe regularly.  Tell your health care provider about any new moles or changes in moles, especially if there is a change in a mole's shape or color.  Also tell your health care provider if you have a mole that is larger than the size of a pencil eraser.  Always use sunscreen. Apply sunscreen liberally and repeatedly throughout the day.  Protect yourself by wearing long sleeves, pants, a wide-brimmed hat, and sunglasses whenever you  are outside.  Heart disease, diabetes, and high blood pressure  High blood pressure causes heart disease and increases the risk of stroke. High blood pressure is more likely to develop in: ? People who have blood pressure in the high end of the normal range (130-139/85-89 mm Hg). ? People who are overweight or obese. ? People who are African American.  If you are 77-29 years of age, have your blood pressure checked every 3-5 years. If you are 24 years of age or older, have your blood pressure checked every year. You should have your blood pressure measured twice-once when you are at a hospital or clinic, and once when you are not at a hospital or clinic. Record the average of the two measurements. To check your blood pressure when you are not at a hospital or clinic, you can use: ? An automated blood pressure machine at a pharmacy. ? A home blood pressure monitor.  If you are between 80 years and 86 years old, ask your health care provider if you should take aspirin to prevent strokes.  Have regular diabetes screenings. This involves taking a blood sample to check your fasting blood sugar level. ? If you are at a normal weight and have a low risk for diabetes, have this test once every three years after 74 years of age. ? If you are overweight and have a high risk for diabetes, consider being tested at a younger age or more often. Preventing infection Hepatitis B  If you have a higher risk for hepatitis B, you should be screened for this virus. You are considered at high risk for hepatitis B if: ? You were born in a country where hepatitis B is common. Ask your health care provider which countries are considered high risk. ? Your parents were born in a high-risk country, and you have not been immunized against hepatitis B (hepatitis B vaccine). ? You have HIV or AIDS. ? You use needles to inject street drugs. ? You live with someone who has hepatitis B. ? You have had sex with someone who  has hepatitis B. ? You get hemodialysis treatment. ? You take certain medicines for conditions, including cancer, organ transplantation, and autoimmune conditions.  Hepatitis C  Blood testing is recommended for: ? Everyone born from [birth year] through 1967. ? Anyone with known risk factors for hepatitis C.  Sexually transmitted infections (STIs)  You should be screened for sexually transmitted infections (STIs) including gonorrhea and chlamydia if: ? You are sexually active and are younger than 75 years of age. ? You are older than 74 years of age and your health care provider tells you that you are at risk for this type of infection. ? Your sexual activity has changed since you were  last screened and you are at an increased risk for chlamydia or gonorrhea. Ask your health care provider if you are at risk.  If you do not have HIV, but are at risk, it may be recommended that you take a prescription medicine daily to prevent HIV infection. This is called pre-exposure prophylaxis (PrEP). You are considered at risk if: ? You are sexually active and do not regularly use condoms or know the HIV status of your partner(s). ? You take drugs by injection. ? You are sexually active with a partner who has HIV.  Talk with your health care provider about whether you are at high risk of being infected with HIV. If you choose to begin PrEP, you should first be tested for HIV. You should then be tested every 3 months for as long as you are taking PrEP. Pregnancy  If you are premenopausal and you may become pregnant, ask your health care provider about preconception counseling.  If you may become pregnant, take 400 to 800 micrograms (mcg) of folic acid every day.  If you want to prevent pregnancy, talk to your health care provider about birth control (contraception). Osteoporosis and menopause  Osteoporosis is a disease in which the bones lose minerals and strength with aging. This can result in serious bone  fractures. Your risk for osteoporosis can be identified using a bone density scan.  If you are 57 years of age or older, or if you are at risk for osteoporosis and fractures, ask your health care provider if you should be screened.  Ask your health care provider whether you should take a calcium or vitamin D supplement to lower your risk for osteoporosis.  Menopause may have certain physical symptoms and risks.  Hormone replacement therapy may reduce some of these symptoms and risks. Talk to your health care provider about whether hormone replacement therapy is right for you. Follow these instructions at home:  Schedule regular health, dental, and eye exams.  Stay current with your immunizations.  Do not use any tobacco products including cigarettes, chewing tobacco, or electronic cigarettes.  If you are pregnant, do not drink alcohol.  If you are breastfeeding, limit how much and how often you drink alcohol.  Limit alcohol intake to no more than 1 drink per day for nonpregnant women. One drink equals 12 ounces of beer, 5 ounces of wine, or 1 ounces of hard liquor.  Do not use street drugs.  Do not share needles.  Ask your health care provider for help if you need support or information about quitting drugs.  Tell your health care provider if you often feel depressed.  Tell your health care provider if you have ever been abused or do not feel safe at home. This information is not intended to replace advice given to you by your health care provider. Make sure you discuss any questions you have with your health care provider. Document Released: 07/27/2010 Document Revised: 06/19/2015 Document Reviewed: 10/15/2014 Elsevier Interactive Patient Education  [employer].

## 2017-08-11 NOTE — Progress Notes (Signed)
I have reviewed and agree with note, evaluation, plan. Reasonable to discuss shingrix at next visit- those are common reactions we have seen.   Manuela Schwartz- you can refill medicines or you can have our team refill them- im ok with refills for these since she has visit coming up- thanks for scheduling her  Garret Reddish, MD

## 2017-08-12 ENCOUNTER — Other Ambulatory Visit: Payer: Self-pay

## 2017-08-12 MED ORDER — AMITRIPTYLINE HCL 50 MG PO TABS: ORAL_TABLET | ORAL | 3 refills | Status: DC

## 2017-08-12 MED ORDER — METFORMIN HCL 500 MG PO TABS: 250.0000 mg | ORAL_TABLET | Freq: Two times a day (BID) | ORAL | 3 refills | Status: DC

## 2017-08-12 MED ORDER — LEVOTHYROXINE SODIUM 75 MCG PO TABS: 75.0000 ug | ORAL_TABLET | Freq: Every day | ORAL | 1 refills | Status: DC

## 2017-08-15 ENCOUNTER — Ambulatory Visit: Payer: Medicare Other | Admitting: Internal Medicine

## 2017-09-07 ENCOUNTER — Ambulatory Visit (INDEPENDENT_AMBULATORY_CARE_PROVIDER_SITE_OTHER): Payer: Medicare Other | Admitting: Family Medicine

## 2017-09-07 ENCOUNTER — Encounter: Payer: Self-pay | Admitting: Family Medicine

## 2017-09-07 DIAGNOSIS — E8881 Metabolic syndrome: Secondary | ICD-10-CM | POA: Diagnosis not present

## 2017-09-07 DIAGNOSIS — I2583 Coronary atherosclerosis due to lipid rich plaque: Secondary | ICD-10-CM

## 2017-09-07 DIAGNOSIS — I2510 Atherosclerotic heart disease of native coronary artery without angina pectoris: Secondary | ICD-10-CM | POA: Diagnosis not present

## 2017-09-07 DIAGNOSIS — M797 Fibromyalgia: Secondary | ICD-10-CM | POA: Diagnosis not present

## 2017-09-07 DIAGNOSIS — I10 Essential (primary) hypertension: Secondary | ICD-10-CM | POA: Diagnosis not present

## 2017-09-07 DIAGNOSIS — E039 Hypothyroidism, unspecified: Secondary | ICD-10-CM

## 2017-09-07 DIAGNOSIS — E119 Type 2 diabetes mellitus without complications: Secondary | ICD-10-CM | POA: Insufficient documentation

## 2017-09-07 LAB — LIPID PANEL
CHOL/HDL RATIO: 2
CHOLESTEROL: 142 mg/dL (ref 0–200)
HDL: 61.1 mg/dL (ref >39.00)
LDL Cholesterol: 66 mg/dL (ref 0–99)
NonHDL: 81.18
Triglycerides: 74 mg/dL (ref 0.0–149.0)
VLDL: 14.8 mg/dL (ref 0.0–40.0)

## 2017-09-07 LAB — COMPREHENSIVE METABOLIC PANEL
ALBUMIN: 3.9 g/dL (ref 3.5–5.2)
ALT: 19 U/L (ref 0–35)
AST: 23 U/L (ref 0–37)
Alkaline Phosphatase: 92 U/L (ref 39–117)
BUN: 13 mg/dL (ref 6–23)
CALCIUM: 9.3 mg/dL (ref 8.4–10.5)
CHLORIDE: 105 meq/L (ref 96–112)
CO2: 29 meq/L (ref 19–32)
CREATININE: 0.89 mg/dL (ref 0.40–1.20)
GFR: 65.81 mL/min (ref >60.00)
Glucose, Bld: 94 mg/dL (ref 70–99)
POTASSIUM: 4.7 meq/L (ref 3.5–5.1)
Sodium: 139 mEq/L (ref 135–145)
Total Bilirubin: 0.4 mg/dL (ref 0.2–1.2)
Total Protein: 6.1 g/dL (ref 6.0–8.3)

## 2017-09-07 LAB — TSH: TSH: 1.01 u[IU]/mL (ref 0.35–4.50)

## 2017-09-07 LAB — HEMOGLOBIN A1C: Hgb A1c MFr Bld: 6.4 % (ref 4.6–6.5)

## 2017-09-07 MED ORDER — ALPRAZOLAM 0.25 MG PO TABS: ORAL_TABLET | ORAL | 1 refills | Status: DC

## 2017-09-07 MED ORDER — LEVOTHYROXINE SODIUM 75 MCG PO TABS: 75.0000 ug | ORAL_TABLET | Freq: Every day | ORAL | 1 refills | Status: DC

## 2017-09-07 MED ORDER — METFORMIN HCL 500 MG PO TABS: 250.0000 mg | ORAL_TABLET | Freq: Two times a day (BID) | ORAL | 3 refills | Status: DC

## 2017-09-07 MED ORDER — AMITRIPTYLINE HCL 50 MG PO TABS: ORAL_TABLET | ORAL | 3 refills | Status: DC

## 2017-09-07 NOTE — Progress Notes (Signed)
Subjective:  Belinda Day is a 72 y.o. year old very pleasant female patient who presents for/with See problem oriented charting ROS- continued shortness of breath and cough but better off lisinopril. No fever or chills. Some joint pain in fingers (she states plans to see rheum)   Past Medical History-  Patient Active Problem List   Diagnosis Date Noted  . Osteopenia 08/14/2011    Priority: High  . CAD (coronary artery disease) s/p CABG 07/03/2007    Priority: High  . Insulin resistance 09/07/2017    Priority: Medium  . Hyperglycemia 08/05/2014    Priority: Medium  . Insomnia 02/12/2013    Priority: Medium  . Nocturnal hypoxemia 12/12/2012    Priority: Medium  . Fibromyalgia 12/28/2011    Priority: Medium  . Hypothyroidism 04/06/2010    Priority: Medium  . MAI (mycobacterium avium-intracellulare) ([place]) 11/19/2009    Priority: Medium  . Hyperlipemia 07/03/2007    Priority: Medium  . Essential hypertension 07/03/2007    Priority: Medium  . Obstructive bronchiectasis ([place]) with GOLD II/III criteria 07/03/2007    Priority: Medium  . Former smoker 08/05/2014    Priority: Low  . Constipation 05/02/2014    Priority: Low  . History of colonic polyps 05/02/2014    Priority: Low  . GERD (gastroesophageal reflux disease) 02/24/2014    Priority: Low  . Hemoptysis 02/24/2014    Priority: Low  . Benign paroxysmal positional vertigo 04/18/2013    Priority: Low  . Diverticulitis 12/21/2012    Priority: Low  . Cystitis 11/05/2016  . Cerebrovascular disease 01/31/2015    Medications- reviewed and updated Current Outpatient Medications  Medication Sig Dispense Refill  . acetaminophen (TYLENOL) 650 MG CR tablet every 8 (eight) hours as needed for pain.     Marland Kitchen albuterol (PROAIR HFA) 108 (90 Base) MCG/ACT inhaler Inhale 2 puffs into the lungs every 6 (six) hours as needed for wheezing or shortness of breath. 1 Inhaler 11  . ALPRAZolam (XANAX) 0.25 MG tablet TAKE 1 TABLET BY MOUTH AT  BEDTIME AS NEEDED FOR SLEEP 90 tablet 1  . amitriptyline (ELAVIL) 50 MG tablet take 1 tablet by mouth at bedtime 90 tablet 3  . aspirin EC 81 MG tablet Take 81 mg by mouth every other day.    Marland Kitchen atorvastatin (LIPITOR) 80 MG tablet Take 1 tablet (80 mg total) by mouth daily. 90 tablet 1  . budesonide-formoterol (SYMBICORT) 160-4.5 MCG/ACT inhaler Take 2 puffs first thing in am and then another 2 puffs about 12 hours later. 1 Inhaler 12  . BYSTOLIC 2.5 MG tablet TAKE 1 TABLET BY MOUTH DAILY 90 tablet 3  . cefdinir (OMNICEF) 300 MG capsule Take 1 capsule (300 mg total) by mouth 2 (two) times daily. 14 capsule 11  . Dextromethorphan-Guaifenesin (MUCINEX DM) 30-600 MG TB12 Take 2 tablets by mouth 2 (two) times daily. Patient takes 1200mg     . levothyroxine (SYNTHROID, LEVOTHROID) 75 MCG tablet Take 1 tablet (75 mcg total) by mouth daily before breakfast. 90 tablet 1  . LINZESS 290 MCG CAPS capsule   0  . Melatonin 5 MG TABS Take 5 mg by mouth at bedtime.    . metFORMIN (GLUCOPHAGE) 500 MG tablet Take 0.5 tablets (250 mg total) by mouth 2 (two) times daily with a meal. 90 tablet 3  . Respiratory Therapy Supplies (FLUTTER) DEVI Use as directed 1 each 0  . telmisartan (MICARDIS) 80 MG tablet Take 1 tablet (80 mg total) by mouth daily. (Patient taking differently: Take  40 mg by mouth daily. ) 30 tablet 11  . UNABLE TO FIND Smart Vest  10Hz  40% - 71/2 mins 11Hz  40%- 7 1/2 mins     No current facility-administered medications for this visit.     Objective: BP 124/68 (BP Location: Left Arm, Patient Position: Sitting, Cuff Size: Large)   Pulse 63   Temp (!) 97.5 F (36.4 C) (Oral)   Ht 5\' 1"  (1.549 m)   Wt 121 lb 12.8 oz (55.2 kg)   SpO2 97%   BMI 23.01 kg/m  Gen: NAD, resting comfortably CV: RRR no murmurs rubs or gallops Lungs: CTAB no crackles, wheeze, rhonchi Abdomen: soft/nontender/nondistended/normal bowel sounds.  Ext: no edema Skin: warm, dry  Assessment/Plan:  Other notes: 1. MAI  and obstructive bronchiectasis- follows with pulmonary clinic. q3 months. She feels things are going well there- she is thankful for Dr. Gustavus Bryant care 2. Had flu like reaction to shingrix #1- we discussed could pursue #2 but likely may have recurrence off similar symptoms  Fibromyalgia S:  remains on amitriptyline 50mg  with tylenol prn. Still with diffuse myalgias but tolerable particularly with prolonged physical work. With poor sleep has more myalgias- she takes xanax to help her get to sleep but stillw akes up early A/P: continue current medicines. Her pharmacist had mentioned changing off amitriptyline- she has no side effects and has been on since 1988 and would prefer to continue  Essential hypertension S: controlled on  bystoic 2.5mg , lisinopril 20mg  --> telmisartan 40mg  (she takes half of 80mg ) BP Readings from Last 3 Encounters:  08/11/17 124/60  08/02/17 132/66  05/16/17 126/76  A/P: We discussed blood pressure goal of <140/90. Continue current meds  Hypothyroidism S: On thyroid medication-  On synthroid 75 mcg seven days a week down from seven days a week plus extra half on sunday Lab Results  Component Value Date   TSH 1.12 05/03/2017  A/P: update tsh, continue current meds  CAD (coronary artery disease) s/p CABG S: sees Dr. Stanford Breed. CABG 2003. Compliant with asa and atorvastatin 80mg  with last LDL under 70.  A/P: continue current meds, update LDL  Insulin resistance S: she remains on metformin 250mg  BID Lab Results  Component Value Date   HGBA1C 6.2 02/16/2017   HGBA1C 6.3 08/23/2016   HGBA1C 6.1 02/09/2016  A/P: will update a1c today on metformin  Future Appointments  Date Time Provider [place]  10/20/2017  8:00 AM Lelon Perla, MD [facility]  11/14/2017  8:45 AM Melvyn Novas, Christena Deem, MD [facility] None   Return in about 6 months (around 03/10/2018).  Lab/Order associations: Fibromyalgia  Essential hypertension - Plan: Lipid panel,  Comprehensive metabolic panel  Hypothyroidism, unspecified type - Plan: TSH  Coronary artery disease due to lipid rich plaque - Plan: Lipid panel, Comprehensive metabolic panel  Insulin resistance - Plan: Hemoglobin A1c  Meds ordered this encounter  Medications  . ALPRAZolam (XANAX) 0.25 MG tablet    Sig: TAKE 1 TABLET BY MOUTH AT BEDTIME AS NEEDED FOR SLEEP    Dispense:  90 tablet    Refill:  1    Return precautions advised.  Garret Reddish, MD

## 2017-09-07 NOTE — Assessment & Plan Note (Signed)
S: sees Dr. Stanford Breed. CABG 2003. Compliant with asa and atorvastatin 80mg  with last LDL under 70.  A/P: continue current meds, update LDL

## 2017-09-07 NOTE — Assessment & Plan Note (Signed)
S: On thyroid medication-  On synthroid 75 mcg seven days a week down from seven days a week plus extra half on sunday Lab Results  Component Value Date   TSH 1.12 05/03/2017  A/P: update tsh, continue current meds

## 2017-09-07 NOTE — Patient Instructions (Addendum)
Health Maintenance Due  Topic Date Due  . INFLUENZA VACCINE -schedule this Fall 08/25/2017   You reported Tdap in 2011 but I have no actual records. If you wanted to play this safe could check with your pharmacy about Td shot.   Team- please refill 3 meds on counter when you come in  02/16/18 or later for physical

## 2017-09-07 NOTE — Assessment & Plan Note (Signed)
S: controlled on  bystoic 2.5mg , lisinopril 20mg  --> telmisartan 40mg  (she takes half of 80mg ) BP Readings from Last 3 Encounters:  08/11/17 124/60  08/02/17 132/66  05/16/17 126/76  A/P: We discussed blood pressure goal of <140/90. Continue current meds

## 2017-09-07 NOTE — Assessment & Plan Note (Signed)
S: she remains on metformin 250mg  BID Lab Results  Component Value Date   HGBA1C 6.2 02/16/2017   HGBA1C 6.3 08/23/2016   HGBA1C 6.1 02/09/2016  A/P: will update a1c today on metformin

## 2017-09-07 NOTE — Addendum Note (Signed)
Addended by: Lyndle Herrlich on: 09/07/2017 08:53 AM   Modules accepted: Orders

## 2017-09-07 NOTE — Assessment & Plan Note (Signed)
S:  remains on amitriptyline 50mg  with tylenol prn. Still with diffuse myalgias but tolerable particularly with prolonged physical work. With poor sleep has more myalgias- she takes xanax to help her get to sleep but stillw akes up early A/P: continue current medicines. Her pharmacist had mentioned changing off amitriptyline- she has no side effects and has been on since 1988 and would prefer to continue

## 2017-09-08 ENCOUNTER — Telehealth: Payer: Self-pay | Admitting: Family Medicine

## 2017-09-08 NOTE — Telephone Encounter (Signed)
Pt given results per Dr Yong Channel," Notes recorded by Marin Olp, MD on 09/07/2017 at 4:13 PM EDT "Your CBC was normal (blood counts, infection fighting cells, platelets).; Your CMET was normal (kidney, liver, and electrolytes, blood sugar).; Your cholesterol looks great with bad cholesterol under goal of 70 at 66; Your thyroid was normal.; Your risk of diabetes is creeping up with a1c up to 6.4 and diabetes diagnosis starting at 6.5. I want you to maximize your diet and exercise as able- I also want our team to send in metformin at 500mg  for twice a day use to try to help prevent diabetes".; the pt states that she uses [facility] [place] and she just picked up prescription for #9 of 250 mg tablets today; she verbalizes understanding.; she also says that she will call back if she has any symptoms like being jittery, or having blurred vision; see telephone encounter dated 09/08/17.; will route to office for notification of this encounter; also see result note dated 09/08/17.

## 2017-09-08 NOTE — Telephone Encounter (Signed)
noted 

## 2017-09-08 NOTE — Telephone Encounter (Signed)
See note

## 2017-09-08 NOTE — Telephone Encounter (Signed)
Copied from [place] [phone] [id number]. Topic: Quick Communication - Lab Results >> Sep 08, 2017 11:08 AM Marian Sorrow, LPN wrote: Hulen Skains patient to inform them of 09/07/2017 lab results. When patient returns call, triage nurse may disclose results. >> Sep 08, 2017 11:37 AM Bea Graff, NT wrote: Pt calling back to receive lab results. Please advise.

## 2017-09-28 ENCOUNTER — Other Ambulatory Visit: Payer: Self-pay | Admitting: Family Medicine

## 2017-09-30 DIAGNOSIS — N952 Postmenopausal atrophic vaginitis: Secondary | ICD-10-CM | POA: Diagnosis not present

## 2017-09-30 DIAGNOSIS — L309 Dermatitis, unspecified: Secondary | ICD-10-CM | POA: Diagnosis not present

## 2017-09-30 NOTE — Telephone Encounter (Signed)
Spoke with pharmacy after getting call from pt. They had put the Rx back on hold and no one at the pharmacy took the time to look for it. But they do have the Rx from 09/07/17 and will be getting that ready for the pt. Nothing further needed. Pt is aware.  ALPRAZolam (XANAX) 0.25 MG tablet

## 2017-10-05 DIAGNOSIS — H532 Diplopia: Secondary | ICD-10-CM | POA: Diagnosis not present

## 2017-10-05 DIAGNOSIS — E119 Type 2 diabetes mellitus without complications: Secondary | ICD-10-CM | POA: Diagnosis not present

## 2017-10-05 LAB — HM DIABETES EYE EXAM

## 2017-10-10 ENCOUNTER — Encounter: Payer: Self-pay | Admitting: Family Medicine

## 2017-10-13 NOTE — Progress Notes (Signed)
HPI: FU CAD; history of coronary artery disease status post coronary artery bypass graft in 2002. Carotid Dopplers December 2015 showed no significant obstruction. Echocardiogram March 2017 showed normal LV function and trace aortic insufficiency. CTA March 2017 showed no pulmonary embolus. There was bronchiectasis noted. Nuclear study April 2017 showed ejection fraction 64% and no ischemia or infarction. Since last seen the patient has dyspnea with more extreme activities but not with routine activities. It is relieved with rest. It is not associated with chest pain. There is no orthopnea, PND or pedal edema. There is no syncope or palpitations. There is no exertional chest pain.   Current Outpatient Medications  Medication Sig Dispense Refill  . acetaminophen (TYLENOL) 650 MG CR tablet every 8 (eight) hours as needed for pain.     Marland Kitchen albuterol (PROAIR HFA) 108 (90 Base) MCG/ACT inhaler Inhale 2 puffs into the lungs every 6 (six) hours as needed for wheezing or shortness of breath. 1 Inhaler 11  . ALPRAZolam (XANAX) 0.25 MG tablet TAKE 1 TABLET BY MOUTH AT BEDTIME AS NEEDED FOR SLEEP 90 tablet 1  . amitriptyline (ELAVIL) 50 MG tablet take 1 tablet by mouth at bedtime 90 tablet 3  . aspirin EC 81 MG tablet Take 81 mg by mouth every other day.    Marland Kitchen atorvastatin (LIPITOR) 80 MG tablet Take 1 tablet (80 mg total) by mouth daily. 90 tablet 1  . budesonide-formoterol (SYMBICORT) 160-4.5 MCG/ACT inhaler Take 2 puffs first thing in am and then another 2 puffs about 12 hours later. 1 Inhaler 12  . BYSTOLIC 2.5 MG tablet TAKE 1 TABLET BY MOUTH DAILY 90 tablet 3  . cefdinir (OMNICEF) 300 MG capsule Take 1 capsule (300 mg total) by mouth 2 (two) times daily. (Patient taking differently: Take 300 mg by mouth as needed. ) 14 capsule 11  . Dextromethorphan-Guaifenesin (MUCINEX DM) 30-600 MG TB12 Take 2 tablets by mouth 2 (two) times daily. Patient takes 1200mg     . levothyroxine (SYNTHROID, LEVOTHROID) 75  MCG tablet Take 1 tablet (75 mcg total) by mouth daily before breakfast. 90 tablet 1  . LINZESS 290 MCG CAPS capsule Take 290 mcg by mouth as needed.   0  . Melatonin 5 MG TABS Take 5 mg by mouth at bedtime.    . metFORMIN (GLUCOPHAGE) 500 MG tablet Take 0.5 tablets (250 mg total) by mouth 2 (two) times daily with a meal. (Patient taking differently: Take 500 mg by mouth 2 (two) times daily with a meal. ) 90 tablet 3  . Respiratory Therapy Supplies (FLUTTER) DEVI Use as directed 1 each 0  . telmisartan (MICARDIS) 80 MG tablet Take 1 tablet (80 mg total) by mouth daily. (Patient taking differently: Take 40 mg by mouth daily. ) 30 tablet 11  . UNABLE TO FIND Smart Vest  10Hz  40% - 71/2 mins 11Hz  40%- 7 1/2 mins     No current facility-administered medications for this visit.      Past Medical History:  Diagnosis Date  . Bronchiectasis    oxygen at night in the past  . CAD (coronary artery disease)   . Diverticulitis 2014  . GERD (gastroesophageal reflux disease)   . History of shingles 04/2012  . HTN (hypertension)   . Hyperlipidemia   . Hypothyroidism   . MAI (mycobacterium avium-intracellulare) ([place])   . Neuritis of upper extremity     Past Surgical History:  Procedure Laterality Date  . CORONARY ARTERY BYPASS GRAFT  2004  x3 CABG    Social History   Socioeconomic History  . Marital status: Single    Spouse name: Not on file  . Number of children: Not on file  . Years of education: Not on file  . Highest education level: Not on file  Occupational History  . Not on file  Social Needs  . Financial resource strain: Not on file  . Food insecurity:    Worry: Not on file    Inability: Not on file  . Transportation needs:    Medical: Not on file    Non-medical: Not on file  Tobacco Use  . Smoking status: Former Smoker    Packs/day: 1.00    Years: 20.00    Pack years: 20.00    Types: Cigarettes    Last attempt to quit: 01/25/1970    Years since quitting: 47.7  .  Smokeless tobacco: Never Used  Substance and Sexual Activity  . Alcohol use: No  . Drug use: No  . Sexual activity: Not on file  Lifestyle  . Physical activity:    Days per week: Not on file    Minutes per session: Not on file  . Stress: Not on file  Relationships  . Social connections:    Talks on phone: Not on file    Gets together: Not on file    Attends religious service: Not on file    Active member of club or organization: Not on file    Attends meetings of clubs or organizations: Not on file    Relationship status: Not on file  . Intimate partner violence:    Fear of current or ex partner: Not on file    Emotionally abused: Not on file    Physically abused: Not on file    Forced sexual activity: Not on file  Other Topics Concern  . Not on file  Social History Narrative   Family: Single never married, no children, cat and rehabs turtles and tortoise   Went to [employer] in [employer] and social work, some business courses at [employer], EMT for 6 years.    LIves alone. Completely independent.    Lives in retirement community.       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 Colon cancer Mother   . Hyperlipidemia Mother   . Hypertension Mother   . Heart disease Mother   . Stroke Mother   . Diabetes Mother   . Colon cancer Father   . Arthritis Father   . Hyperlipidemia Father   . Hypertension Father   . Heart disease Father   . Stroke Father   . Atopy Neg Hx     ROS: no fevers or chills, productive cough, hemoptysis, dysphasia, odynophagia, melena, hematochezia, dysuria, hematuria, rash, seizure activity, orthopnea, PND, pedal edema, claudication. Remaining systems are negative.  Physical Exam: Well-developed well-nourished in no acute distress.  Skin is warm and dry.  HEENT is normal.  Neck is supple.    Chest is clear to auscultation with normal expansion.  Cardiovascular exam is regular rate and rhythm.  Abdominal exam nontender or distended. No masses palpated. Extremities show no edema. neuro grossly intact  ECG-sinus rhythm at a rate of 70.  Nonspecific ST changes.  Personally reviewed  A/P  1 coronary artery disease status post coronary artery bypass graft-patient denies any  chest pain.  We will continue with medical therapy including aspirin and statin.  2 hypertension-patient's blood pressure is controlled.  Continue present medications and follow.  3 hyperlipidemia-continue statin.  4 carotid artery disease-last Doppler showed no obstructive disease.  Kirk Ruths, MD

## 2017-10-18 ENCOUNTER — Telehealth: Payer: Self-pay | Admitting: *Deleted

## 2017-10-18 NOTE — Telephone Encounter (Signed)
Patient returned call, she was able to move the appointment, rescheduled same day for 12:20.   Patient verbalized change.

## 2017-10-18 NOTE — Telephone Encounter (Signed)
New Message: ° ° ° ° ° ° °Pt is returning a call °

## 2017-10-18 NOTE — Telephone Encounter (Signed)
Left message for pt to call, she has an appointment with dr Stanford Breed Thursday this week, 10-20-17 @ 8 am. Dr Stanford Breed has a meeting and I was calling to see if the patient could come at 12:20 pm the same day. If not able to move, it is okay to leave her at 8 am, per dr Stanford Breed.

## 2017-10-20 ENCOUNTER — Ambulatory Visit: Payer: Medicare Other | Admitting: Cardiology

## 2017-10-20 ENCOUNTER — Encounter: Payer: Self-pay | Admitting: Cardiology

## 2017-10-20 VITALS — BP 128/70 | HR 70 | Ht 61.0 in | Wt 119.0 lb

## 2017-10-20 DIAGNOSIS — E7800 Pure hypercholesterolemia, unspecified: Secondary | ICD-10-CM | POA: Diagnosis not present

## 2017-10-20 DIAGNOSIS — I10 Essential (primary) hypertension: Secondary | ICD-10-CM | POA: Diagnosis not present

## 2017-10-20 DIAGNOSIS — I2510 Atherosclerotic heart disease of native coronary artery without angina pectoris: Secondary | ICD-10-CM | POA: Diagnosis not present

## 2017-10-20 NOTE — Patient Instructions (Signed)
Your physician wants you to follow-up in: ONE YEAR WITH DR CRENSHAW You will receive a reminder letter in the mail two months in advance. If you don't receive a letter, please call our office to schedule the follow-up appointment.   If you need a refill on your cardiac medications before your next appointment, please call your pharmacy.  

## 2017-10-24 ENCOUNTER — Other Ambulatory Visit: Payer: Self-pay | Admitting: Family Medicine

## 2017-10-25 ENCOUNTER — Telehealth: Payer: Self-pay | Admitting: Family Medicine

## 2017-10-25 NOTE — Telephone Encounter (Signed)
See request °

## 2017-10-25 NOTE — Telephone Encounter (Signed)
Called pharamcy and spoke with tech there. She states patient is requesting generic. I gave the ok for generic as long as patient came and had her labs drawn in 6 weeks to have her levels checked. She has previously been on the brand name

## 2017-10-25 NOTE — Telephone Encounter (Unsigned)
Copied from [place] [id number]. Topic: General - Other >> Oct 25, 2017 11:53 AM Judyann Munson wrote: Reason for CRM: [facility] pharmacy is calling to confirm if the medication levothyroxine (SYNTHROID, LEVOTHROID) 75 MCG tablet  is for the generic type. Please advise

## 2017-11-14 ENCOUNTER — Ambulatory Visit: Payer: Medicare Other | Admitting: Internal Medicine

## 2017-11-14 ENCOUNTER — Encounter: Payer: Self-pay | Admitting: Internal Medicine

## 2017-11-14 VITALS — BP 118/60 | HR 84 | Ht 61.0 in | Wt 116.4 lb

## 2017-11-14 DIAGNOSIS — J479 Bronchiectasis, uncomplicated: Secondary | ICD-10-CM

## 2017-11-14 NOTE — Patient Instructions (Addendum)
Plan A = Automatic = symbicort 160 Take 2 puffs first thing in am and then another 2 puffs about 12 hours later.  And take zmax daily   Plan B = Backup for  breathing Only use your albuterol as a rescue medication to be used if you can't catch your breath by resting or doing a relaxed purse lip breathing pattern.  - The less you use it, the better it will work when you need it. - Ok to use the inhaler up to 2 puffs  every 4 hours if you must but call for appointment if use goes up over your usual need - Don't leave home without it !!  (think of it like the spare tire for your car)    Plan B = Nastier than nl especially assoc with any fever >  omnicef 300 mg twice daily x 7days   Plan C = Call me if needed and A and B aren't working well     Please schedule a follow up visit in 3 months but call sooner if needed  cxr on return

## 2017-11-14 NOTE — Progress Notes (Signed)
Subjective:     Patient ID: Belinda Day, female   DOB: 02/27/1945    MRN: [id number]   Brief patient profile:  72  yowf quit smoking 1972 with documented right middle lobe syndrome and evidence of bronchiectasis by CT scan in March 2002  And GOLD II criteria for copd 09/2010 / MM phenotype for alpha one   History of Present Illness  08/08/07 FOB with classic cobblestoning and MAI on culture.   08/15/07 given Levaquin x 10 days with resolution bloody mucus, but "felt she had flu the whole time" with aches, feverish   August 29, 2007 ov: first post bronch co still coughing up mucus clear and initiate rx with symbicort/ [place]   October 19, 2007 ov no cough , sob, feeling great but no improvement on cxr   December 07, 2007 ov feeling great, minimal am cough not productive. No sob.   Opth eval, labs ok 10/2007   September 06, 2008 ov overall better over the last year, less tendency to exac on zmax and ethambutol. rec complete another year > satisfied improved 90% and stopped zmax and eth 08/2009     05/16/17 rec zmax daily but did not do  11/14/2017  f/u ov/Belinda Day re: obst bronchiectasis/ confused with details of care / taking zpak prn but also has omnicef and not sure what to do with it  Chief Complaint  Patient presents with  . Follow-up    no current problems   Dyspnea:  yardwork ok / MMRC1 = can walk nl pace, flat grade, can't hurry or go uphills or steps s sob   Cough: variable esp in am / using flutter and vest prn / mucus beige and about a tbsp in am s heme Sleeping:  Bed flat and one big pilow  SABA use: rarely needed  02: no     No obvious day to day or daytime variability or assoc   mucus plugs or hemoptysis or cp or chest tightness, subjective wheeze or overt sinus or hb symptoms.   Sleeping as above  without nocturnal   exacerbation  of respiratory  c/o's or need for noct saba. Also denies any obvious fluctuation of symptoms with weather or environmental  changes or other aggravating or alleviating factors except as outlined above   No unusual exposure hx or h/o childhood pna/ asthma or knowledge of premature birth.  Current Allergies, Complete Past Medical History, Past Surgical History, Family History, and Social History were reviewed in [employer] record.  ROS  The following are not active complaints unless bolded Hoarseness, sore throat, dysphagia, dental problems, itching, sneezing,  nasal congestion or discharge of excess mucus or purulent secretions, ear ache,   fever, chills, sweats, unintended wt loss or wt gain, classically pleuritic or exertional cp,  orthopnea pnd or arm/hand swelling  or leg swelling, presyncope, palpitations, abdominal pain, anorexia, nausea, vomiting, diarrhea  or change in bowel habits or change in bladder habits, change in stools or change in urine, dysuria, hematuria,  rash, arthralgias, visual complaints, headache, numbness, weakness or ataxia or problems with walking or coordination,  change in mood or  memory.        Current Meds  Medication Sig  . acetaminophen (TYLENOL) 650 MG CR tablet every 8 (eight) hours as needed for pain.   Marland Kitchen albuterol (PROAIR HFA) 108 (90 Base) MCG/ACT inhaler Inhale 2 puffs into the lungs every 6 (six) hours as needed for wheezing or shortness  of breath.  . ALPRAZolam (XANAX) 0.25 MG tablet TAKE 1 TABLET BY MOUTH AT BEDTIME AS NEEDED FOR SLEEP  . amitriptyline (ELAVIL) 50 MG tablet take 1 tablet by mouth at bedtime  . aspirin EC 81 MG tablet Take 81 mg by mouth every other day.  Marland Kitchen atorvastatin (LIPITOR) 80 MG tablet Take 1 tablet (80 mg total) by mouth daily.  . budesonide-formoterol (SYMBICORT) 160-4.5 MCG/ACT inhaler Take 2 puffs first thing in am and then another 2 puffs about 12 hours later.  Marland Kitchen BYSTOLIC 2.5 MG tablet TAKE 1 TABLET BY MOUTH DAILY  . Dextromethorphan-Guaifenesin (MUCINEX DM) 30-600 MG TB12 Take 2 tablets by mouth 2 (two) times daily. Patient  takes 1200mg   . levothyroxine (SYNTHROID, LEVOTHROID) 75 MCG tablet Take 1 tablet (75 mcg total) by mouth daily before breakfast.  . LINZESS 290 MCG CAPS capsule Take 290 mcg by mouth as needed.   . Melatonin 5 MG TABS Take 5 mg by mouth at bedtime.  . metFORMIN (GLUCOPHAGE) 500 MG tablet Take 0.5 tablets (250 mg total) by mouth 2 (two) times daily with a meal. (Patient taking differently: Take 500 mg by mouth 2 (two) times daily with a meal. )  . Respiratory Therapy Supplies (FLUTTER) DEVI Use as directed  . telmisartan (MICARDIS) 80 MG tablet Take 1 tablet (80 mg total) by mouth daily. (Patient taking differently: Take 40 mg by mouth daily. )  .   Smart Vest  10Hz  40% - 71/2 mins 11Hz  40%- 7 1/2 mins             Past Medical History:  Bronchiectasis see CT SE 04/13/00  - HFA 75% November 19, 2009  - alpha one screen 01/14/2015 >  MM  - IgE 01/14/2015 = 11  MAI  - Rx Zmax and ETH 08/29/07 > 08/2009 restarted empirically 05/31/14 > 09/03/14 (no change in cough so just use zpak for flares)  - Rx zmax maint  10/16/2015 >>> d/c 08/03/2016 > restarted 05/16/17 and clinically improved 08/02/2017 using prn omnicef to supplment  - Eye eval   10[place] cycles of [place]ipro April 02, 2010  HEALTH MAINTENANCE...........................[name] - Td 10/2007  - Pneumovax 2005   and 10/29/2011 age 72, prevnar 08/16/2013  CAD  Hyperlipidemia  Hypertension  History of cough with ACE inhibition.  Gastroesophageal reflux disease         Objective:   Physical Exam   amb wf nad    Wt 130 October 19, 2007>140 March 31, 2010 > 127 07/16/2010 > 10/14/2010  120 > 05/04/2011  117 > 07/30/2011  120 > 10/29/2011 118 > 130  05/01/2012 > 12/12/2012 132 >  08/14/13 137 >    02/20/2014  137 >  05/31/2014 134 > 09/03/2014    140 > 10/15/2014 137 > 01/14/2015 137 >  05/15/2015 123 >08/14/2015  124 >  10/16/2015 126 > 03/02/2016   124  > 05/04/2016  126 > 08/03/2016   130 > 02/14/2017  126 >  05/16/2017 128 > 08/02/2017  119 > 11/14/2017  116     Vital signs reviewed - Note on arrival 02 sats  98% on RA      HEENT: nl dentition / oropharynx. Nl external ear canals without cough reflex -  Mild bilateral non-specific turbinate edema     NECK :  without JVD/Nodes/TM/ nl carotid upstrokes bilaterally   LUNGS: no acc muscle use,  Mild barrel  contour chest wall with bilateral  Mid  exp rhonchi  and  without cough on insp or exp maneuver and mild  Hyperresonant  to  percussion bilaterally     CV:  RRR  no s3 or murmur or increase in P2, and no edema   ABD:  soft and nontender with pos late insp Hoover's  in the supine position. No bruits or organomegaly appreciated, bowel sounds nl  MS:   Nl gait/  ext warm without deformities, calf tenderness, cyanosis or clubbing No obvious joint restrictions   SKIN: warm and dry without lesions    NEURO:  alert, approp, nl sensorium with  no motor or cerebellar deficits apparent.              Assessment:

## 2017-11-16 ENCOUNTER — Encounter: Payer: Self-pay | Admitting: Internal Medicine

## 2017-11-16 NOTE — Assessment & Plan Note (Addendum)
-   PFT's 10/14/2010  FEV1  1.25 (68%) and ratio 58% and DLCO 90%     - PFT's 10/29/2011  FEV1  1.33 (74%) and ratio 57 % and DLCO 93%    - PFTs 08/14/2013   FEV1  1.16 (60%) and ratio 61 with dlco 83%     - Flutter valve added 09/03/14      - alpha one   01/14/2015 >  MM, level 147     - IgE  01/14/15  11 - CT chest 04/17/15 Marked chronic bronchiectasis and volume loss in the right middle lobe with milder bronchiectasis, bronchial wall thickening, and nodular densities throughout the right upper and right lower lobe suggestive of chr onic endobronchial/atypical mycobacterial infection. - 10/16/2015 changed to symbicort 80 2bid (? Higher doses contributing to w MAI /freq of infections)   - 03/02/2016  After extensive coaching HFA effectiveness =    90%  - 05/04/2016 VEST stared around May 25 2016 - try off zmax 08/03/2016 > no change clinically as of 11/05/2016  - 02/14/2017 cycles of omnicef x 7 days prn purulent sputum  PFT's  05/16/2017  FEV1 0.89 (47 % ) ratio 60  p 12 % improvement from saba p nothing prior to study   - 05/16/2017  After extensive coaching inhaler device  effectiveness =    90%  - 05/16/17 quant Ig's ok x M slt low (not acutely ill at the time)  -zmax daily restarted 05/16/17 and clinically improved 08/02/2017 using prn omnicef to supplment but did not continue zmax as rec   - 11/14/2017  After extensive coaching inhaler device,  effectiveness =    90% baseline  - restart zmax daily 11/14/2017 with omnicef prn flare    Doing ok but struggling with details of care/ needs f/u cxr at next ov and follow plan as outlined using the written action plans as discussed.  I had an extended discussion with the patient reviewing all relevant studies completed to date and  lasting 15 to 20 minutes of a 25 minute visit    See device teaching which extended face to face time for this visit.  Each maintenance medication was reviewed in detail including emphasizing most importantly the difference  between maintenance and prns and under what circumstances the prns are to be triggered using an action plan format that is not reflected in the computer generated alphabetically organized AVS which I have not found useful in most complex patients, especially with respiratory illnesses  Please see AVS for specific instructions unique to this visit that I personally wrote and verbalized to the the pt in detail and then reviewed with pt  by my nurse highlighting any  changes in therapy recommended at today's visit to their plan of care.

## 2017-12-06 DIAGNOSIS — L821 Other seborrheic keratosis: Secondary | ICD-10-CM | POA: Diagnosis not present

## 2017-12-06 DIAGNOSIS — D2261 Melanocytic nevi of right upper limb, including shoulder: Secondary | ICD-10-CM | POA: Diagnosis not present

## 2017-12-06 DIAGNOSIS — Z85828 Personal history of other malignant neoplasm of skin: Secondary | ICD-10-CM | POA: Diagnosis not present

## 2017-12-06 DIAGNOSIS — C44719 Basal cell carcinoma of skin of left lower limb, including hip: Secondary | ICD-10-CM | POA: Diagnosis not present

## 2017-12-06 DIAGNOSIS — L720 Epidermal cyst: Secondary | ICD-10-CM | POA: Diagnosis not present

## 2017-12-06 DIAGNOSIS — L570 Actinic keratosis: Secondary | ICD-10-CM | POA: Diagnosis not present

## 2017-12-28 DIAGNOSIS — Z1231 Encounter for screening mammogram for malignant neoplasm of breast: Secondary | ICD-10-CM | POA: Diagnosis not present

## 2017-12-28 DIAGNOSIS — R1032 Left lower quadrant pain: Secondary | ICD-10-CM | POA: Diagnosis not present

## 2017-12-28 DIAGNOSIS — K5904 Chronic idiopathic constipation: Secondary | ICD-10-CM | POA: Diagnosis not present

## 2017-12-28 DIAGNOSIS — Z800 Family history of malignant neoplasm of digestive organs: Secondary | ICD-10-CM | POA: Diagnosis not present

## 2017-12-28 DIAGNOSIS — K5792 Diverticulitis of intestine, part unspecified, without perforation or abscess without bleeding: Secondary | ICD-10-CM | POA: Diagnosis not present

## 2017-12-28 LAB — CBC AND DIFFERENTIAL
HEMATOCRIT: 40 (ref 36–46)
HEMOGLOBIN: 13.4 (ref 12.0–16.0)
Platelets: 308 (ref 150–399)
WBC: 7.1

## 2017-12-28 LAB — HM MAMMOGRAPHY

## 2017-12-30 ENCOUNTER — Encounter: Payer: Self-pay | Admitting: Family Medicine

## 2018-01-03 ENCOUNTER — Encounter: Payer: Self-pay | Admitting: Family Medicine

## 2018-01-11 DIAGNOSIS — K5904 Chronic idiopathic constipation: Secondary | ICD-10-CM | POA: Diagnosis not present

## 2018-01-11 DIAGNOSIS — R1032 Left lower quadrant pain: Secondary | ICD-10-CM | POA: Diagnosis not present

## 2018-02-13 ENCOUNTER — Other Ambulatory Visit: Payer: Self-pay

## 2018-02-13 DIAGNOSIS — J479 Bronchiectasis, uncomplicated: Secondary | ICD-10-CM

## 2018-02-14 ENCOUNTER — Ambulatory Visit: Payer: Medicare Other | Admitting: Internal Medicine

## 2018-02-14 ENCOUNTER — Encounter: Payer: Self-pay | Admitting: Internal Medicine

## 2018-02-14 ENCOUNTER — Ambulatory Visit (INDEPENDENT_AMBULATORY_CARE_PROVIDER_SITE_OTHER)
Admission: RE | Admit: 2018-02-14 | Discharge: 2018-02-14 | Disposition: A | Discharge status: Home / Self Care | Payer: Medicare Other | Source: Ambulatory Visit | Attending: Internal Medicine | Admitting: Internal Medicine

## 2018-02-14 VITALS — BP 122/64 | HR 74 | Ht 61.0 in | Wt 116.4 lb

## 2018-02-14 DIAGNOSIS — J479 Bronchiectasis, uncomplicated: Secondary | ICD-10-CM

## 2018-02-14 MED ORDER — ALBUTEROL SULFATE HFA 108 (90 BASE) MCG/ACT IN AERS: 2.0000 | INHALATION_SPRAY | Freq: Four times a day (QID) | RESPIRATORY_TRACT | 1 refills | Status: DC | PRN

## 2018-02-14 MED ORDER — CEFDINIR 300 MG PO CAPS: 300.0000 mg | ORAL_CAPSULE | Freq: Two times a day (BID) | ORAL | 0 refills | Status: DC

## 2018-02-14 MED ORDER — OSELTAMIVIR PHOSPHATE 75 MG PO CAPS: 75.0000 mg | ORAL_CAPSULE | Freq: Two times a day (BID) | ORAL | 0 refills | Status: DC

## 2018-02-14 NOTE — Progress Notes (Signed)
Subjective:     Patient ID: Belinda Day, female   DOB: 1945/12/10    MRN: [id number]   Brief patient profile:  73  yowf quit smoking 1972 with documented right middle lobe syndrome and evidence of bronchiectasis by CT scan in March 2002  And GOLD II criteria for copd 09/2010 / MM phenotype for alpha one   History of Present Illness  08/08/07 FOB with classic cobblestoning and MAI on culture.   08/15/07 given Levaquin x 10 days with resolution bloody mucus, but "felt she had flu the whole time" with aches, feverish   August 29, 2007 ov: first post bronch co still coughing up mucus clear and initiate rx with symbicort/ [place]   October 19, 2007 ov no cough , sob, feeling great but no improvement on cxr   December 07, 2007 ov feeling great, minimal am cough not productive. No sob.   Opth eval, labs ok 10/2007   September 06, 2008 ov overall better over the last year, less tendency to exac on zmax and ethambutol. rec complete another year > satisfied improved 90% and stopped zmax and eth 08/2009     05/16/17 rec zmax daily but did not do  11/14/2017  f/u ov/Izaah Westman re: obst bronchiectasis/ confused with details of care / taking zpak prn but also has omnicef and not sure what to do with it  Chief Complaint  Patient presents with  . Follow-up    no current problems   Dyspnea:  yardwork ok / MMRC1 = can walk nl pace, flat grade, can't hurry or go uphills or steps s sob   Cough: variable esp in am / using flutter and vest prn / mucus beige and about a tbsp in am s heme Sleeping:  Bed flat and one big pilow  SABA use: rarely needed  rec Plan A = Automatic = symbicort 160  And take zmax daily  Plan B = Backup for  breathing Only use your albuterol as a rescue medication Plan B = for mucus if Nastier than nl especially assoc with any fever >  omnicef 300 mg twice daily x 7days Plan C = Call me if needed and A and B aren't working well   cxr on return    02/14/2018  f/u  ov/Ryelynn Guedea re:  obst bronchiectasis / maint symb 160 2bid an zmax  Chief Complaint  Patient presents with  . Follow-up    infrequent cough   Dyspnea: Not limited by breathing from desired activities  But not very active this winter Cough: none now- zmax helped a lot in retrospect  Sleeping: able to lie bed one big pillow SABA use: none 02: none    No obvious day to day or daytime variability or assoc excess/ purulent sputum or mucus plugs or hemoptysis or cp or chest tightness, subjective wheeze or overt sinus or hb symptoms.   Sleeping as above  without nocturnal  or early am exacerbation  of respiratory  c/o's or need for noct saba. Also denies any obvious fluctuation of symptoms with weather or environmental changes or other aggravating or alleviating factors except as outlined above   No unusual exposure hx or h/o childhood pna/ asthma or knowledge of premature birth.  Current Allergies, Complete Past Medical History, Past Surgical History, Family History, and Social History were reviewed in [employer] record.  ROS  The following are not active complaints unless bolded Hoarseness, sore throat, dysphagia, dental problems, itching, sneezing,  nasal congestion or discharge of excess mucus or purulent secretions, ear ache,   fever, chills, sweats, unintended wt loss or wt gain, classically pleuritic or exertional cp,  orthopnea pnd or arm/hand swelling  or leg swelling, presyncope, palpitations, abdominal pain, anorexia, nausea, vomiting, diarrhea  or change in bowel habits or change in bladder habits, change in stools or change in urine, dysuria, hematuria,  rash, arthralgias, visual complaints, headache, numbness, weakness or ataxia or problems with walking or coordination,  change in mood or  memory.        Current Meds  Medication Sig  . acetaminophen (TYLENOL) 650 MG CR tablet every 8 (eight) hours as needed for pain.   Marland Kitchen albuterol (PROAIR HFA) 108 (90 Base)  MCG/ACT inhaler Inhale 2 puffs into the lungs every 6 (six) hours as needed for wheezing or shortness of breath.  . ALPRAZolam (XANAX) 0.25 MG tablet TAKE 1 TABLET BY MOUTH AT BEDTIME AS NEEDED FOR SLEEP  . amitriptyline (ELAVIL) 50 MG tablet take 1 tablet by mouth at bedtime  . aspirin EC 81 MG tablet Take 81 mg by mouth every other day.  Marland Kitchen atorvastatin (LIPITOR) 80 MG tablet Take 1 tablet (80 mg total) by mouth daily.  Marland Kitchen azithromycin (ZITHROMAX) 250 MG tablet 1 tablet daily.  . budesonide-formoterol (SYMBICORT) 160-4.5 MCG/ACT inhaler Take 2 puffs first thing in am and then another 2 puffs about 12 hours later.  Marland Kitchen BYSTOLIC 2.5 MG tablet TAKE 1 TABLET BY MOUTH DAILY  . Dextromethorphan-Guaifenesin (MUCINEX DM) 30-600 MG TB12 Take 2 tablets by mouth 2 (two) times daily. Patient takes 1200mg   . levothyroxine (SYNTHROID, LEVOTHROID) 75 MCG tablet Take 1 tablet (75 mcg total) by mouth daily before breakfast.  . LINZESS 290 MCG CAPS capsule Take 290 mcg by mouth as needed.   . Melatonin 5 MG TABS Take 5 mg by mouth at bedtime.  . metFORMIN (GLUCOPHAGE) 500 MG tablet Take 0.5 tablets (250 mg total) by mouth 2 (two) times daily with a meal. (Patient taking differently: Take 500 mg by mouth 2 (two) times daily with a meal. )  . Respiratory Therapy Supplies (FLUTTER) DEVI Use as directed  . telmisartan (MICARDIS) 80 MG tablet Take 1 tablet (80 mg total) by mouth daily. (Patient taking differently: Take 40 mg by mouth daily. )  . UNABLE TO FIND Smart Vest  10Hz  40% - 71/2 mins 11Hz  40%- 7 1/2 mins  . [DISCONTINUED] albuterol (PROAIR HFA) 108 (90 Base) MCG/ACT inhaler Inhale 2 puffs into the lungs every 6 (six) hours as needed for wheezing or shortness of breath.  . [DISCONTINUED] cefdinir (OMNICEF) 300 MG capsule Take 1 capsule (300 mg total) by mouth 2 (two) times daily.                  Past Medical History:  Bronchiectasis see CT SE 04/13/00  - HFA 75% November 19, 2009  - alpha one screen  01/14/2015 >  MM  - IgE 01/14/2015 = 11  MAI  - Rx Zmax and ETH 08/29/07 > 08/2009 restarted empirically 05/31/14 > 09/03/14 (no change in cough so just use zpak for flares)  - Rx zmax maint  10/16/2015 >>> d/c 08/03/2016 > restarted 05/16/17 and clinically improved 08/02/2017 using prn omnicef to supplment  - Eye eval   10[place] cycles of cipro April 02, 2010  HEALTH MAINTENANCE...........................[name] - Td 10/2007  - Pneumovax 2005   and 10/29/2011 age 23, prevnar 08/16/2013  CAD  Hyperlipidemia  Hypertension  History of cough with ACE inhibition.  Gastroesophageal reflux disease         Objective:   Physical Exam  amb wf nad  Wt 130 October 19, 2007>140 March 31, 2010 > 127 07/16/2010 > 10/14/2010  120 > 05/04/2011  117 > 07/30/2011  120 > 10/29/2011 118 > 130  05/01/2012 > 12/12/2012 132 >  08/14/13 137 >    02/20/2014  137 >  05/31/2014 134 > 09/03/2014    140 > 10/15/2014 137 > 01/14/2015 137 >  05/15/2015 123 >08/14/2015  124 >  10/16/2015 126 > 03/02/2016   124  > 05/04/2016  126 > 08/03/2016   130 > 02/14/2017  126 > 05/16/2017 128 > 08/02/2017  119 > 11/14/2017  116 > 02/14/2018       Vital signs reviewed - Note on arrival 02 sats  97% on RA    HEENT: nl dentition / oropharynx. Nl external ear canals without cough reflex -  Mild bilateral non-specific turbinate edema     NECK :  without JVD/Nodes/TM/ nl carotid upstrokes bilaterally   LUNGS: no acc muscle use,  Mild barrel  contour chest wall with bilateral  Distant bs s audible wheeze and  without cough on insp or exp maneuver and mild  Hyperresonant  to  percussion bilaterally     CV:  RRR  no s3 or murmur or increase in P2, and no edema   ABD:  soft and nontender with pos end  insp Hoover's  in the supine position. No bruits or organomegaly appreciated, bowel sounds nl  MS:   Nl gait/  ext warm without deformities, calf tenderness, cyanosis or clubbing No obvious joint restrictions    SKIN: warm and dry without lesions    NEURO:  alert, approp, nl sensorium with  no motor or cerebellar deficits apparent.             CXR PA and Lateral:   02/14/2018 :    I personally reviewed images and agree with radiology impression as follows:    No active cardiopulmonary disease. Chronic scar bilateral lung bases.         Assessment:

## 2018-02-14 NOTE — Patient Instructions (Signed)
No change in medications    Please schedule a follow up visit in 6  months but call sooner if needed  

## 2018-02-15 ENCOUNTER — Encounter: Payer: Self-pay | Admitting: Internal Medicine

## 2018-02-15 NOTE — Assessment & Plan Note (Signed)
Onset of symptoms around 2002     - PFT's 10/14/2010  FEV1  1.25 (68%) and ratio 58% and DLCO 90%     - PFT's 10/29/2011  FEV1  1.33 (74%) and ratio 57 % and DLCO 93%    - PFTs 08/14/2013   FEV1  1.16 (60%) and ratio 61 with dlco 83%     - Flutter valve added 09/03/14      - alpha one   01/14/2015 >  MM, level 147     - IgE  01/14/15  11 - CT chest 04/17/15 Marked chronic bronchiectasis and volume loss in the right middle lobe with milder bronchiectasis, bronchial wall thickening, and nodular densities throughout the right upper and right lower lobe suggestive of chr onic endobronchial/atypical mycobacterial infection. - 10/16/2015 changed to symbicort 80 2bid (? Higher doses contributing to w MAI /freq of infections)   - 03/02/2016  After extensive coaching HFA effectiveness =    90%  - 05/04/2016 VEST stared around May 25 2016 - try off zmax 08/03/2016 > no change clinically as of 11/05/2016  - 02/14/2017 cycles of omnicef x 7 days prn purulent sputum  PFT's  05/16/2017  FEV1 0.89 (47 % ) ratio 60  p 12 % improvement from saba p nothing prior to study   - 05/16/2017  After extensive coaching inhaler device  effectiveness =    90%  - 05/16/17 quant Ig's ok x M slt low (not acutely ill at the time)  -zmax daily restarted 05/16/17 and clinically improved 08/02/2017 using prn omnicef to supplment but did not continue zmax as rec  - 11/14/2017  After extensive coaching inhaler device,  effectiveness =    90% baseline   - restart zmax daily 11/14/2017 > improved 02/14/2018    Adequate control on present rx, reviewed in detail with pt > no change in rx needed     Each maintenance medication was reviewed in detail including most importantly the difference between maintenance and as needed and under what circumstances the prns are to be used.  Please see AVS for specific  Instructions which are unique to this visit and I personally typed out  which were reviewed in detail in writing with the patient and a copy  provided.

## 2018-02-20 DIAGNOSIS — K5904 Chronic idiopathic constipation: Secondary | ICD-10-CM | POA: Diagnosis not present

## 2018-03-07 DIAGNOSIS — L570 Actinic keratosis: Secondary | ICD-10-CM | POA: Diagnosis not present

## 2018-03-10 ENCOUNTER — Ambulatory Visit (INDEPENDENT_AMBULATORY_CARE_PROVIDER_SITE_OTHER): Payer: Medicare Other | Admitting: Family Medicine

## 2018-03-10 ENCOUNTER — Encounter: Payer: Self-pay | Admitting: Family Medicine

## 2018-03-10 VITALS — BP 130/62 | HR 61 | Temp 97.5°F | Ht 61.0 in | Wt 118.0 lb

## 2018-03-10 DIAGNOSIS — I2583 Coronary atherosclerosis due to lipid rich plaque: Secondary | ICD-10-CM

## 2018-03-10 DIAGNOSIS — A310 Pulmonary mycobacterial infection: Secondary | ICD-10-CM | POA: Diagnosis not present

## 2018-03-10 DIAGNOSIS — E039 Hypothyroidism, unspecified: Secondary | ICD-10-CM

## 2018-03-10 DIAGNOSIS — E785 Hyperlipidemia, unspecified: Secondary | ICD-10-CM | POA: Diagnosis not present

## 2018-03-10 DIAGNOSIS — R739 Hyperglycemia, unspecified: Secondary | ICD-10-CM | POA: Diagnosis not present

## 2018-03-10 DIAGNOSIS — I2510 Atherosclerotic heart disease of native coronary artery without angina pectoris: Secondary | ICD-10-CM | POA: Diagnosis not present

## 2018-03-10 DIAGNOSIS — J479 Bronchiectasis, uncomplicated: Secondary | ICD-10-CM

## 2018-03-10 DIAGNOSIS — Z0000 Encounter for general adult medical examination without abnormal findings: Secondary | ICD-10-CM

## 2018-03-10 DIAGNOSIS — D692 Other nonthrombocytopenic purpura: Secondary | ICD-10-CM

## 2018-03-10 DIAGNOSIS — I10 Essential (primary) hypertension: Secondary | ICD-10-CM

## 2018-03-10 DIAGNOSIS — M85851 Other specified disorders of bone density and structure, right thigh: Secondary | ICD-10-CM

## 2018-03-10 LAB — COMPREHENSIVE METABOLIC PANEL
ALK PHOS: 87 U/L (ref 39–117)
ALT: 28 U/L (ref 0–35)
AST: 32 U/L (ref 0–37)
Albumin: 4.3 g/dL (ref 3.5–5.2)
BUN: 14 mg/dL (ref 6–23)
CO2: 29 mEq/L (ref 19–32)
Calcium: 9.6 mg/dL (ref 8.4–10.5)
Chloride: 98 mEq/L (ref 96–112)
Creatinine, Ser: 0.85 mg/dL (ref 0.40–1.20)
GFR: 65.21 mL/min (ref >60.00)
GLUCOSE: 84 mg/dL (ref 70–99)
Potassium: 4.3 mEq/L (ref 3.5–5.1)
Sodium: 135 mEq/L (ref 135–145)
Total Bilirubin: 0.6 mg/dL (ref 0.2–1.2)
Total Protein: 6.5 g/dL (ref 6.0–8.3)

## 2018-03-10 LAB — CBC
HCT: 42.9 % (ref 36.0–46.0)
Hemoglobin: 14.3 g/dL (ref 12.0–15.0)
MCHC: 33.3 g/dL (ref 30.0–36.0)
MCV: 96.7 fl (ref 78.0–100.0)
Platelets: 261 10*3/uL (ref 150.0–400.0)
RBC: 4.44 Mil/uL (ref 3.87–5.11)
RDW: 12.8 % (ref 11.5–15.5)
WBC: 5.3 10*3/uL (ref 4.0–10.5)

## 2018-03-10 LAB — LIPID PANEL
Cholesterol: 143 mg/dL (ref 0–200)
HDL: 68.6 mg/dL (ref >39.00)
LDL Cholesterol: 59 mg/dL (ref 0–99)
NonHDL: 74.65
Total CHOL/HDL Ratio: 2
Triglycerides: 79 mg/dL (ref 0.0–149.0)
VLDL: 15.8 mg/dL (ref 0.0–40.0)

## 2018-03-10 LAB — TSH: TSH: 0.94 u[IU]/mL (ref 0.35–4.50)

## 2018-03-10 LAB — HEMOGLOBIN A1C: Hgb A1c MFr Bld: 6 % (ref 4.6–6.5)

## 2018-03-10 MED ORDER — SYNTHROID 75 MCG PO TABS: 75.0000 ug | ORAL_TABLET | Freq: Every day | ORAL | 3 refills | Status: DC

## 2018-03-10 MED ORDER — METFORMIN HCL 500 MG PO TABS: 500.0000 mg | ORAL_TABLET | Freq: Two times a day (BID) | ORAL | 3 refills | Status: DC

## 2018-03-10 MED ORDER — ALPRAZOLAM 0.25 MG PO TABS: ORAL_TABLET | ORAL | 1 refills | Status: DC

## 2018-03-10 MED ORDER — AMITRIPTYLINE HCL 50 MG PO TABS: ORAL_TABLET | ORAL | 3 refills | Status: DC

## 2018-03-10 NOTE — Patient Instructions (Addendum)
Health Maintenance Due  Topic Date Due  . COLONOSCOPY- Patient scheduled for next week . Have eagle send [place] a copy 01/26/2018   Please stop by lab before you go If you do not have mychart- we will call you about results within 5 business days of [place] receiving them.  If you have mychart- we will send your results within 3 business days of [place] receiving them.  If abnormal or we want to clarify a result, we will call or mychart you to make sure you receive the message.  If you have questions or concerns or don't hear within 5-7 days, please send [place] a message or call us.

## 2018-03-10 NOTE — Progress Notes (Signed)
Phone: [phone]   Subjective:  Patient presents today for their annual physical. Chief complaint-noted.   See problem oriented charting- ROS- full  review of systems was completed and negative except for: constipation, muscle aches, bruise easily from aspirin  The following were reviewed and entered/updated in epic: Past Medical History:  Diagnosis Date  . Bronchiectasis    oxygen at night in the past  . CAD (coronary artery disease)   . Diverticulitis 2014  . GERD (gastroesophageal reflux disease)   . History of shingles 04/2012  . HTN (hypertension)   . Hyperlipidemia   . Hypothyroidism   . MAI (mycobacterium avium-intracellulare) ([place])   . Neuritis of upper extremity    Patient Active Problem List   Diagnosis Date Noted  . Osteopenia 08/14/2011    Priority: High  . CAD (coronary artery disease) s/p CABG 07/03/2007    Priority: High  . Insulin resistance 09/07/2017    Priority: Medium  . Hyperglycemia 08/05/2014    Priority: Medium  . Insomnia 02/12/2013    Priority: Medium  . Nocturnal hypoxemia 12/12/2012    Priority: Medium  . Fibromyalgia 12/28/2011    Priority: Medium  . Hypothyroidism 04/06/2010    Priority: Medium  . MAI (mycobacterium avium-intracellulare) ([place]) 11/19/2009    Priority: Medium  . Hyperlipemia 07/03/2007    Priority: Medium  . Essential hypertension 07/03/2007    Priority: Medium  . Obstructive bronchiectasis ([place]) with GOLD II/III criteria 07/03/2007    Priority: Medium  . Former smoker 08/05/2014    Priority: Low  . Constipation 05/02/2014    Priority: Low  . History of colonic polyps 05/02/2014    Priority: Low  . GERD (gastroesophageal reflux disease) 02/24/2014    Priority: Low  . Hemoptysis 02/24/2014    Priority: Low  . Benign paroxysmal positional vertigo 04/18/2013    Priority: Low  . Diverticulitis 12/21/2012    Priority: Low  . Cystitis 11/05/2016  . Cerebrovascular disease 01/31/2015   Past Surgical History:    Procedure Laterality Date  . CORONARY ARTERY BYPASS GRAFT  2004   x3 CABG    Family History  Problem Relation Age of Onset  . Colon cancer Mother   . Hyperlipidemia Mother   . Hypertension Mother   . Heart disease Mother   . Stroke Mother   . Diabetes Mother   . Colon cancer Father   . Arthritis Father   . Hyperlipidemia Father   . Hypertension Father   . Heart disease Father   . Stroke Father   . Atopy Neg Hx     Medications- reviewed and updated Current Outpatient Medications  Medication Sig Dispense Refill  . acetaminophen (TYLENOL) 650 MG CR tablet every 8 (eight) hours as needed for pain.     Marland Kitchen albuterol (PROAIR HFA) 108 (90 Base) MCG/ACT inhaler Inhale 2 puffs into the lungs every 6 (six) hours as needed for wheezing or shortness of breath. 1 Inhaler 1  . aspirin EC 81 MG tablet Take 81 mg by mouth every other day.    Marland Kitchen atorvastatin (LIPITOR) 80 MG tablet Take 1 tablet (80 mg total) by mouth daily. 90 tablet 1  . azithromycin (ZITHROMAX) 250 MG tablet 1 tablet daily.    . budesonide-formoterol (SYMBICORT) 160-4.5 MCG/ACT inhaler Take 2 puffs first thing in am and then another 2 puffs about 12 hours later. 1 Inhaler 12  . BYSTOLIC 2.5 MG tablet TAKE 1 TABLET BY MOUTH DAILY 90 tablet 3  . Dextromethorphan-Guaifenesin ([place] DM) 30-600  MG TB12 Take 2 tablets by mouth 2 (two) times daily. Patient takes 1200mg     . levothyroxine (SYNTHROID, LEVOTHROID) 75 MCG tablet Take 1 tablet (75 mcg total) by mouth daily before breakfast. 90 tablet 1  . Melatonin 5 MG TABS Take 5 mg by mouth at bedtime.    . polyethylene glycol-electrolytes (NULYTELY/GOLYTELY) 420 g solution MIX AND DRINK UTD    . Respiratory Therapy Supplies (FLUTTER) DEVI Use as directed 1 each 0  . telmisartan (MICARDIS) 80 MG tablet Take 1 tablet (80 mg total) by mouth daily. (Patient taking differently: Take 40 mg by mouth daily. ) 30 tablet 11  . ALPRAZolam (XANAX) 0.25 MG tablet TAKE 1 TABLET BY MOUTH AT BEDTIME  AS NEEDED FOR SLEEP 90 tablet 1  . amitriptyline (ELAVIL) 50 MG tablet take 1 tablet by mouth at bedtime 90 tablet 3  . metFORMIN (GLUCOPHAGE) 500 MG tablet Take 1 tablet (500 mg total) by mouth 2 (two) times daily with a meal. 180 tablet 3  . SYNTHROID 75 MCG tablet Take 1 tablet (75 mcg total) by mouth daily before breakfast. 90 tablet 3  . TRULANCE 3 MG TABS TK 1 T PO QD     No current facility-administered medications for this visit.     Allergies-reviewed and updated Allergies  Allergen Reactions  . Bactrim [Sulfamethoxazole-Trimethoprim] Hives, Itching and Other (See Comments)    Bruised like areas on body  . Ciprofloxacin Other (See Comments)    Body aches  . Codeine Nausea Only    REACTION: nausea  . Erythromycin Nausea Only    REACTION: nausea  . Levofloxacin Other (See Comments)    REACTION: aches    Social History   Social History Narrative   Family: Single never married, no children, cat and rehabs turtles and tortoise   Went to [employer] in [employer] and social work, some business courses at [employer], EMT for 6 years.    LIves alone. Completely independent.    Lives in retirement community.       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 Oropharynx normal Neck: no thyromegaly CV: RRR no murmurs rubs or gallops Lungs: CTAB no crackles, wheeze, rhonchi Abdomen: soft/nontender/nondistended/normal bowel sounds. No rebound or guarding.  Ext: no edema Skin: warm, dry Neuro: grossly normal, moves all extremities, PERRLA   Assessment and Plan   73 y.o. female presenting  for annual physical.  Health Maintenance counseling: 1. Anticipatory guidance: Patient counseled regarding regular dental exams -q6 months, eye exams - yearly,  avoiding smoking and second hand smoke , limiting alcohol to 1 beverage per day- doesn't drink .   2. Risk factor reduction:  Advised patient of need for regular exercise and diet rich and fruits and vegetables to reduce risk of heart attack and stroke. Exercise- some decrease in winter months- gets more active when weather is better. Diet-healthy/balanced diet.  Wt Readings from Last 3 Encounters:  03/10/18 118 lb (53.5 kg)  02/14/18 116 lb 6.4 oz (52.8 kg)  11/14/17 116 lb 6.4 oz (52.8  kg)  3. Immunizations/screenings/ancillary studies-up-to-date as was able to finish Shingrix vaccinations  Immunization History  Administered Date(s) Administered  . H1N1 01/02/2008  . Influenza Split 10/14/2010, 10/07/2011  . Influenza, High Dose Seasonal PF 11/05/2016  . Influenza,inj,Quad PF,6+ Mos 10/02/2012, 10/16/2015, 11/07/2017  . Influenza-Unspecified 09/25/2013, 10/15/2014, 11/07/2017  . Pneumococcal Conjugate-13 08/14/2013  . Pneumococcal Polysaccharide-23 10/29/2011  . Zoster Recombinat (Shingrix) 06/25/2017, 09/30/2017  4. Cervical cancer screening- past age based screening-continues to follow-up with Teryl Lucy yearly with GYN- doesn't get paps anymore 5. Breast cancer screening-  breast exam with GYN and mammogram - getting yearly in decmber 6. Colon cancer screening - January 2015 with 5-year follow-up due to family history- scheduled next week with [facility] 7. Skin cancer screening-follows with Dr. Martinique yearly at least.  Advised regular sunscreen use. Denies worrisome, changing, or new skin lesions.  8. Birth control/STD check- postmenopausal/not active 9. Osteoporosis screening at 4- see discussion below -10.  Former smoker- 20 pack years but quit in 1972.  No regular screening plan.  Status of chronic or acute concerns     #Fibromyalgia/insomnia S: Compliant with amitriptyline 50 mg-due to age, her pharmacist has mentioned coming off amitriptyline but she has been on since 1988 and prefers to continue.  Uses Tylenol as needed. -Uses Xanax to help with sleep.  Very difficult to fall asleep if she does not take this.  Fibromyalgia symptoms worsen with poor sleep A/P: Stable problems x2- continue current medication  % Pulmonary-MAI and obstructive bronchiectasis-follows with Dr. Melvyn Novas S: Patient follows with pulmonary clinic.  On regular Symbicort.  Albuterol as needed.  Uses azithromycin daily. No recent albuterol use A/P:   Stable. Continue current medications.    % #CAD status post CABG-follows with Dr. Jones Broom S: Compliant with atorvastatin 80 mg and aspirin 81 mg.  LDL goal under 70 A/P: Stable problems.  Update lipids.  No change in meds today.   #Hypertension S: Compliant with Bystolic 2.5 mg, telmisartan 40 mg-takes half of an 80 mg pill  A/P: Stable problem-update CBC, CMP today  #Hypothyroidism S: Compliant with Synthroid 75 mcg A/P: Hopefully stable-update TSH today    #Hyperglycemia/prediabetes S: Compliant with metformin 500 mg twice a day. A/P: Hopefully remains controlled- update A1c today   #Osteopenia-technically osteoporosis since has been on Fosamax in the past S: Takes vitamin D alone and all areas were stable-"02/16/17 DEXA. -2.2 at R femur neck-23% 10 year risk and hip fracture risk 12%.".  We opted not to restart Fosamax and repeat January 2021 A/P: Likely stable-continue vitamin D-bone density next year   #Constipation S: Patient compliant with sparing Linzess. Tried a new rx but was too expensive- she may try milk of magnesia A/P: Stable problem-continue Linzess   #senile purpura- easy bruising on aspirin   Future Appointments  Date Time Provider [place]  08/15/2018  9:00 AM Tanda Rockers, MD [facility] None   Did not discuss follow-up-6 months  would be reasonable.  Definitely 1 year for physical  Lab/Order associations:Fasting Preventative health care - Plan: CBC, Comprehensive metabolic panel, Lipid panel, Hemoglobin A1c, TSH  Coronary artery disease due to lipid rich plaque  Hyperglycemia - Plan: Hemoglobin A1c  MAI (mycobacterium avium-intracellulare) ([facility])  Obstructive bronchiectasis ([facility]) with GOLD II/III criteria  Senile purpura ([facility])  Essential hypertension  Hyperlipidemia, unspecified hyperlipidemia type - Plan: CBC, Comprehensive metabolic panel, Lipid panel  Hypothyroidism, unspecified type - Plan: TSH  Osteopenia of right hip  Meds ordered this encounter  Medications  .  ALPRAZolam (XANAX) 0.25 MG tablet    Sig: TAKE 1 TABLET BY MOUTH AT BEDTIME AS NEEDED FOR SLEEP    Dispense:  90 tablet    Refill:  1  . amitriptyline (ELAVIL) 50 MG tablet    Sig: take 1 tablet by mouth at bedtime    Dispense:  90 tablet    Refill:  3  . metFORMIN (GLUCOPHAGE) 500 MG tablet    Sig: Take 1 tablet (500 mg total) by mouth 2 (two) times daily with a meal.    Dispense:  180 tablet    Refill:  3  . SYNTHROID 75 MCG tablet    Sig: Take 1 tablet (75 mcg total) by mouth daily before breakfast.    Dispense:  90 tablet    Refill:  3   Return precautions advised.  Garret Reddish, MD

## 2018-03-16 DIAGNOSIS — K640 First degree hemorrhoids: Secondary | ICD-10-CM | POA: Diagnosis not present

## 2018-03-16 DIAGNOSIS — D120 Benign neoplasm of cecum: Secondary | ICD-10-CM | POA: Diagnosis not present

## 2018-03-16 DIAGNOSIS — Z800 Family history of malignant neoplasm of digestive organs: Secondary | ICD-10-CM | POA: Diagnosis not present

## 2018-03-16 DIAGNOSIS — K5730 Diverticulosis of large intestine without perforation or abscess without bleeding: Secondary | ICD-10-CM | POA: Diagnosis not present

## 2018-03-16 DIAGNOSIS — Z86010 Personal history of colonic polyps: Secondary | ICD-10-CM | POA: Diagnosis not present

## 2018-03-21 DIAGNOSIS — D120 Benign neoplasm of cecum: Secondary | ICD-10-CM | POA: Diagnosis not present

## 2018-03-30 ENCOUNTER — Other Ambulatory Visit: Payer: Self-pay | Admitting: Family Medicine

## 2018-04-27 ENCOUNTER — Other Ambulatory Visit: Payer: Self-pay | Admitting: Gastroenterology

## 2018-04-27 DIAGNOSIS — K5792 Diverticulitis of intestine, part unspecified, without perforation or abscess without bleeding: Secondary | ICD-10-CM

## 2018-04-27 DIAGNOSIS — R1032 Left lower quadrant pain: Secondary | ICD-10-CM

## 2018-05-01 ENCOUNTER — Other Ambulatory Visit: Payer: Self-pay | Admitting: Internal Medicine

## 2018-05-01 ENCOUNTER — Other Ambulatory Visit: Payer: Self-pay

## 2018-05-01 ENCOUNTER — Ambulatory Visit
Admission: RE | Admit: 2018-05-01 | Discharge: 2018-05-01 | Disposition: A | Discharge status: Home / Self Care | Payer: Medicare Other | Source: Ambulatory Visit | Attending: Gastroenterology | Admitting: Gastroenterology

## 2018-05-01 DIAGNOSIS — K5730 Diverticulosis of large intestine without perforation or abscess without bleeding: Secondary | ICD-10-CM | POA: Diagnosis not present

## 2018-05-01 DIAGNOSIS — R1032 Left lower quadrant pain: Secondary | ICD-10-CM

## 2018-05-01 DIAGNOSIS — K5792 Diverticulitis of intestine, part unspecified, without perforation or abscess without bleeding: Secondary | ICD-10-CM

## 2018-05-01 MED ORDER — IOPAMIDOL (ISOVUE-300) INJECTION 61%
100.0000 mL | Freq: Once | INTRAVENOUS | Status: AC | PRN
  Administered 2018-05-01: 100 mL via INTRAVENOUS

## 2018-05-13 ENCOUNTER — Other Ambulatory Visit: Payer: Self-pay | Admitting: Adult Health

## 2018-05-15 NOTE — Telephone Encounter (Signed)
Atorvastatin 80mg refilled  

## 2018-05-23 ENCOUNTER — Other Ambulatory Visit: Payer: Self-pay | Admitting: Internal Medicine

## 2018-08-02 DIAGNOSIS — Z85828 Personal history of other malignant neoplasm of skin: Secondary | ICD-10-CM | POA: Diagnosis not present

## 2018-08-02 DIAGNOSIS — L245 Irritant contact dermatitis due to other chemical products: Secondary | ICD-10-CM | POA: Diagnosis not present

## 2018-08-09 ENCOUNTER — Other Ambulatory Visit: Payer: Self-pay | Admitting: Internal Medicine

## 2018-08-11 DIAGNOSIS — Z1211 Encounter for screening for malignant neoplasm of colon: Secondary | ICD-10-CM | POA: Diagnosis not present

## 2018-08-11 DIAGNOSIS — D120 Benign neoplasm of cecum: Secondary | ICD-10-CM | POA: Diagnosis not present

## 2018-08-11 DIAGNOSIS — K635 Polyp of colon: Secondary | ICD-10-CM | POA: Diagnosis not present

## 2018-08-11 DIAGNOSIS — Z951 Presence of aortocoronary bypass graft: Secondary | ICD-10-CM | POA: Diagnosis not present

## 2018-08-11 DIAGNOSIS — Z800 Family history of malignant neoplasm of digestive organs: Secondary | ICD-10-CM | POA: Diagnosis not present

## 2018-08-11 DIAGNOSIS — Z87891 Personal history of nicotine dependence: Secondary | ICD-10-CM | POA: Diagnosis not present

## 2018-08-11 DIAGNOSIS — Z86010 Personal history of colonic polyps: Secondary | ICD-10-CM | POA: Diagnosis not present

## 2018-08-11 DIAGNOSIS — K5730 Diverticulosis of large intestine without perforation or abscess without bleeding: Secondary | ICD-10-CM | POA: Diagnosis not present

## 2018-08-11 DIAGNOSIS — I2510 Atherosclerotic heart disease of native coronary artery without angina pectoris: Secondary | ICD-10-CM | POA: Diagnosis not present

## 2018-08-11 LAB — HM COLONOSCOPY

## 2018-08-15 ENCOUNTER — Other Ambulatory Visit: Payer: Self-pay

## 2018-08-15 ENCOUNTER — Ambulatory Visit: Payer: Medicare Other | Admitting: Internal Medicine

## 2018-08-15 ENCOUNTER — Encounter: Payer: Self-pay | Admitting: Internal Medicine

## 2018-08-15 DIAGNOSIS — J479 Bronchiectasis, uncomplicated: Secondary | ICD-10-CM | POA: Diagnosis not present

## 2018-08-15 DIAGNOSIS — A310 Pulmonary mycobacterial infection: Secondary | ICD-10-CM

## 2018-08-15 DIAGNOSIS — I10 Essential (primary) hypertension: Secondary | ICD-10-CM

## 2018-08-15 NOTE — Assessment & Plan Note (Addendum)
08/08/07 FOB with classic cobblestoning and MAI on culture.  - Rx 08/2007   To 08/2009 with ETH/Zmax - restarted empirically 05/31/14 > stopped 09/03/14 no benefit perceived, no change on cxr  - restart zmax daily 03/02/2016 > d/c 08/03/2016 > no change in symptoms or freq exac  -zmax daily restarted 05/16/17 and clinically improved 08/02/2017 using prn omnicef to supplment   Adequate control on present rx, reviewed in detail with pt > no change in rx needed
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
## 2018-08-15 NOTE — Assessment & Plan Note (Signed)
Adequate control on present rx, reviewed in detail with pt > no change in rx needed  = micardis 80 mg one half daily    > 50 % of 25 min ov spent on counseling/ contingency planning purposes

## 2018-08-15 NOTE — Assessment & Plan Note (Signed)
Onset of symptoms around 2002     - PFT's 10/14/2010  FEV1  1.25 (68%) and ratio 58% and DLCO 90%     - PFT's 10/29/2011  FEV1  1.33 (74%) and ratio 57 % and DLCO 93%    - PFTs 08/14/2013   FEV1  1.16 (60%) and ratio 61 with dlco 83%     - Flutter valve added 09/03/14      - alpha one   01/14/2015 >  MM, level 147     - IgE  01/14/15  11 - CT chest 04/17/15 Marked chronic bronchiectasis and volume loss in the right middle lobe with milder bronchiectasis, bronchial wall thickening, and nodular densities throughout the right upper and right lower lobe suggestive of chr onic endobronchial/atypical mycobacterial infection. - 10/16/2015 changed to symbicort 80 2bid (? Higher doses contributing to w MAI /freq of infections)   - 03/02/2016  After extensive coaching HFA effectiveness =    90%  - 05/04/2016 VEST stared around May 25 2016 - try off zmax 08/03/2016 > no change clinically as of 11/05/2016  - 02/14/2017 cycles of omnicef x 7 days prn purulent sputum  PFT's  05/16/2017  FEV1 0.89 (47 % ) ratio 60  p 12 % improvement from saba p nothing prior to study   - 05/16/2017  After extensive coaching inhaler device  effectiveness =    90%  - 05/16/17 quant Ig's ok x M slt low (not acutely ill at the time)  -zmax daily restarted 05/16/17 and clinically improved 08/02/2017 using prn omnicef to supplment but did not continue zmax as rec  - 11/14/2017  After extensive coaching inhaler device,  effectiveness =    90% baseline  - restart zmax daily 11/14/2017 > improved 02/14/2018    Adequate control on present rx, reviewed in detail with pt > no change in rx needed   Pt informed of the seriousness of COVID 19 infection as a direct risk to their health  and safey and to those of their loved ones and should continue to wear facemask in public and minimize exposure to public locations but especially avoid any area or activity where non-close contacts are not observing distancing or wearing an appropriate face mask.     Each maintenance medication was reviewed in detail including most importantly the difference between maintenance and as needed and under what circumstances the prns are to be used.  Please see AVS for specific  Instructions which are unique to this visit and I personally typed out  which were reviewed in detail in writing with the patient and a copy provided.

## 2018-08-15 NOTE — Patient Instructions (Addendum)
mucinex (congested) or mucinex dm (harsh/ drier sounding)  are both as needed for cough   Generic symbicort is the exact drug   Please schedule a follow up visit in 6  months but call sooner if needed

## 2018-08-21 ENCOUNTER — Other Ambulatory Visit: Payer: Self-pay | Admitting: Cardiology

## 2018-09-01 ENCOUNTER — Other Ambulatory Visit: Payer: Self-pay

## 2018-09-01 ENCOUNTER — Telehealth: Payer: Self-pay | Admitting: Cardiology

## 2018-09-01 MED ORDER — ATORVASTATIN CALCIUM 80 MG PO TABS: 80.0000 mg | ORAL_TABLET | Freq: Every day | ORAL | 0 refills | Status: DC

## 2018-09-01 NOTE — Telephone Encounter (Signed)
New Message      *STAT* If patient is at the pharmacy, call can be transferred to refill team.   1. Which medications need to be refilled? (please list name of each medication and dose if known) atorvastatin 80 mg 1 x daily   2. Which pharmacy/location (including street and city if local pharmacy) is medication to be sent to?[facility] on [address] [place]   3. Do they need a 30 day or 90 day supply? 90 supply    Pt said she got it filled on 07/09 and they only gave her 12 pills. She is needing them filled again and is only going to fill it for 12  But she needs a 90 day

## 2018-09-01 NOTE — Telephone Encounter (Signed)
Medication sent to pharmacy, as 90 day- patient has one year follow up 09/25

## 2018-09-06 ENCOUNTER — Encounter: Payer: Self-pay | Admitting: *Deleted

## 2018-09-07 ENCOUNTER — Encounter: Payer: Self-pay | Admitting: Family Medicine

## 2018-09-08 ENCOUNTER — Ambulatory Visit (INDEPENDENT_AMBULATORY_CARE_PROVIDER_SITE_OTHER): Payer: Medicare Other | Admitting: Family Medicine

## 2018-09-08 ENCOUNTER — Other Ambulatory Visit: Payer: Self-pay

## 2018-09-08 ENCOUNTER — Encounter: Payer: Self-pay | Admitting: Family Medicine

## 2018-09-08 VITALS — BP 132/74 | HR 73 | Temp 97.6°F | Ht 61.0 in | Wt 127.0 lb

## 2018-09-08 DIAGNOSIS — E039 Hypothyroidism, unspecified: Secondary | ICD-10-CM

## 2018-09-08 DIAGNOSIS — I2583 Coronary atherosclerosis due to lipid rich plaque: Secondary | ICD-10-CM

## 2018-09-08 DIAGNOSIS — R739 Hyperglycemia, unspecified: Secondary | ICD-10-CM | POA: Diagnosis not present

## 2018-09-08 DIAGNOSIS — E785 Hyperlipidemia, unspecified: Secondary | ICD-10-CM | POA: Diagnosis not present

## 2018-09-08 DIAGNOSIS — I2510 Atherosclerotic heart disease of native coronary artery without angina pectoris: Secondary | ICD-10-CM | POA: Diagnosis not present

## 2018-09-08 DIAGNOSIS — I10 Essential (primary) hypertension: Secondary | ICD-10-CM | POA: Diagnosis not present

## 2018-09-08 DIAGNOSIS — M85851 Other specified disorders of bone density and structure, right thigh: Secondary | ICD-10-CM

## 2018-09-08 LAB — LIPID PANEL
Cholesterol: 135 mg/dL (ref 0–200)
HDL: 63.9 mg/dL (ref >39.00)
LDL Cholesterol: 57 mg/dL (ref 0–99)
NonHDL: 71.52
Total CHOL/HDL Ratio: 2
Triglycerides: 75 mg/dL (ref 0.0–149.0)
VLDL: 15 mg/dL (ref 0.0–40.0)

## 2018-09-08 LAB — COMPREHENSIVE METABOLIC PANEL
ALT: 25 U/L (ref 0–35)
AST: 28 U/L (ref 0–37)
Albumin: 4.1 g/dL (ref 3.5–5.2)
Alkaline Phosphatase: 76 U/L (ref 39–117)
BUN: 15 mg/dL (ref 6–23)
CO2: 27 mEq/L (ref 19–32)
Calcium: 9.1 mg/dL (ref 8.4–10.5)
Chloride: 103 mEq/L (ref 96–112)
Creatinine, Ser: 0.77 mg/dL (ref 0.40–1.20)
GFR: 72.99 mL/min (ref >60.00)
Glucose, Bld: 87 mg/dL (ref 70–99)
Potassium: 4.2 mEq/L (ref 3.5–5.1)
Sodium: 138 mEq/L (ref 135–145)
Total Bilirubin: 0.4 mg/dL (ref 0.2–1.2)
Total Protein: 6.1 g/dL (ref 6.0–8.3)

## 2018-09-08 LAB — HEMOGLOBIN A1C: Hgb A1c MFr Bld: 6.2 % (ref 4.6–6.5)

## 2018-09-08 LAB — TSH: TSH: 1.2 u[IU]/mL (ref 0.35–4.50)

## 2018-09-08 MED ORDER — ALPRAZOLAM 0.25 MG PO TABS: ORAL_TABLET | ORAL | 1 refills | Status: DC

## 2018-09-08 NOTE — Patient Instructions (Addendum)
Health Maintenance Due  Topic Date Due  . INFLUENZA VACCINE  08/26/2018  We should have flu shots available by September. Please strongly consider getting flu shot this year. If you get your flu shot at a pharmacy- please let us know.   discussed true lows would be under 70- will have team show her how to use meter (give meter)    Please stop by lab before you go If you do not have mychart- we will call you about results within 5 business days of [place] receiving them.  If you have mychart- we will send your results within 3 business days of [place] receiving them.  If abnormal or we want to clarify a result, we will call or mychart you to make sure you receive the message.  If you have questions or concerns or don't hear within 5-7 days, please send [place] a message or call us.

## 2018-09-08 NOTE — Progress Notes (Signed)
Phone [phone]   Subjective:  Belinda Day is a 73 y.o. year old very pleasant female patient who presents for/with See problem oriented charting Chief Complaint  Patient presents with  . Follow-up    Fasting today.   . Hypertension  . Hyperglycemia  . Hyperlipidemia  . Insomnia  . Hypothyroidism   ROS-  Denies HA, dizziness, CP, SOB, visual changes.  Past Medical History-  Patient Active Problem List   Diagnosis Date Noted  . Osteopenia 08/14/2011    Priority: High  . CAD (coronary artery disease) s/p CABG 07/03/2007    Priority: High  . Insulin resistance 09/07/2017    Priority: Medium  . Hyperglycemia 08/05/2014    Priority: Medium  . Insomnia 02/12/2013    Priority: Medium  . Nocturnal hypoxemia 12/12/2012    Priority: Medium  . Fibromyalgia 12/28/2011    Priority: Medium  . Hypothyroidism 04/06/2010    Priority: Medium  . MAI (mycobacterium avium-intracellulare) ([place]) 11/19/2009    Priority: Medium  . Hyperlipemia 07/03/2007    Priority: Medium  . Essential hypertension 07/03/2007    Priority: Medium  . Obstructive bronchiectasis ([place]) with GOLD II/III criteria 07/03/2007    Priority: Medium  . Former smoker 08/05/2014    Priority: Low  . Constipation 05/02/2014    Priority: Low  . History of colonic polyps 05/02/2014    Priority: Low  . GERD (gastroesophageal reflux disease) 02/24/2014    Priority: Low  . Hemoptysis 02/24/2014    Priority: Low  . Benign paroxysmal positional vertigo 04/18/2013    Priority: Low  . Diverticulitis 12/21/2012    Priority: Low  . Cystitis 11/05/2016  . Cerebrovascular disease 01/31/2015    Medications- reviewed and updated Current Outpatient Medications  Medication Sig Dispense Refill  . acetaminophen (TYLENOL) 650 MG CR tablet every 8 (eight) hours as needed for pain.     Marland Kitchen albuterol (PROAIR HFA) 108 (90 Base) MCG/ACT inhaler Inhale 2 puffs into the lungs every 6 (six) hours as needed for wheezing or  shortness of breath. 1 Inhaler 1  . ALPRAZolam (XANAX) 0.25 MG tablet TAKE 1 TABLET BY MOUTH AT BEDTIME AS NEEDED FOR SLEEP 90 tablet 1  . amitriptyline (ELAVIL) 50 MG tablet take 1 tablet by mouth at bedtime 90 tablet 3  . aspirin EC 81 MG tablet Take 81 mg by mouth every other day.    Marland Kitchen atorvastatin (LIPITOR) 80 MG tablet Take 1 tablet (80 mg total) by mouth daily. 90 tablet 0  . azithromycin (ZITHROMAX) 250 MG tablet TAKE 1 TABLET BY MOUTH DAILY 30 tablet 5  . budesonide-formoterol (SYMBICORT) 160-4.5 MCG/ACT inhaler INHALE 2 PUFFS FIRST THING IN THE MORNING AND THEN ANOTHER 2 PUFFS ABOUT 12 HOURS LATER 10.2 g 2  . BYSTOLIC 2.5 MG tablet TAKE 1 TABLET BY MOUTH DAILY 90 tablet 0  . Dextromethorphan-Guaifenesin (MUCINEX DM) 30-600 MG TB12 Take 2 tablets by mouth 2 (two) times daily. Patient takes 1200mg     . Melatonin 5 MG TABS Take 5 mg by mouth at bedtime.    . metFORMIN (GLUCOPHAGE) 500 MG tablet Take 1 tablet (500 mg total) by mouth 2 (two) times daily with a meal. 180 tablet 3  . Respiratory Therapy Supplies (FLUTTER) DEVI Use as directed 1 each 0  . SYNTHROID 75 MCG tablet Take 1 tablet (75 mcg total) by mouth daily before breakfast. 90 tablet 3  . telmisartan (MICARDIS) 80 MG tablet TAKE 1 TABLET(80 MG) BY MOUTH DAILY (Patient taking differently: 40 mg. )  30 tablet 6   No current facility-administered medications for this visit.      Objective:  BP 132/74 (BP Location: Left Arm, Patient Position: Sitting, Cuff Size: Normal)   Pulse 73   Temp 97.6 F (36.4 C) (Oral)   Ht 5\' 1"  (1.549 m)   Wt 127 lb (57.6 kg)   SpO2 99%   BMI 24.00 kg/m  Gen: NAD, resting comfortably CV: RRR no murmurs rubs or gallops Lungs: Diffuse occasional crackles Abdomen: soft/nontender/nondistended/normal bowel sounds. No rebound or guarding.  Ext: no edema Skin: warm, dry    Assessment and Plan   #hypertension S: controlled on Bystolic 2.5 mg and Telmisartan 80 mg daily. Not checking BP at home.   Adds salt to food. Avoids fast food. Eats frozen dinners.  BP Readings from Last 3 Encounters:  09/08/18 132/74  08/15/18 120/64  03/10/18 130/62  A/P: Stable. Continue current medications.   #hyperlipidemia/CAD status post CABG follows with Dr. Stanford Breed S: Lipids controlled on Atorvastatin 80 mg daily. Also taking Aspirin 81 mg daily. Tolerating well. Reports bruising from Aspirin.   No chest pain or shortness of breath Lab Results  Component Value Date   CHOL 143 03/10/2018   HDL 68.60 03/10/2018   LDLCALC 59 03/10/2018   LDLDIRECT 72.0 02/16/2017   TRIG 79.0 03/10/2018   CHOLHDL 2 03/10/2018   A/P: lipids have been well controlled- she requests full lipid panel update before seeing cardiology. We discussed not sure about coverage for full lipid panel but she would like to proceed with this over LDL  CAD- appears stable- continue cardiology follow up  #hypothyroidism S: On thyroid medication-Synthroid 75 mcg daily.  Reports chronic constipation, takes milk of magnesia for this (nothing new)  Lab Results  Component Value Date   TSH 0.94 03/10/2018  A/P: hopefully stable - update tsh and continue levothyroxine  # Hyperglycemia/Insulin Resistance S: Hopefully remains controlled on Metformin 500 mg BID.  CBGs- Not checking at home. Reports rare occurrence of hypoglycemia.  Knows how to respond to this- carries a bar with her or has OJ at home- very rare. Usually happens if pushes lunch back- encouraged to be cautious  Exercise and diet- Does yard work 1-2 hours daily, uses push mover. Pretty good about watching carb and sugar intake, has not been as good during pandemic.  Lab Results  Component Value Date   HGBA1C 6.0 03/10/2018   HGBA1C 6.4 09/07/2017   HGBA1C 6.2 02/16/2017   A/P: hopefully a1c remains 6.4 or below- update a1c - discussed true lows would be under 70- will have team show her how to use meter.   # Insomnia S:Taking Alprazolam 0.25 mg at bedtime.  Getting 7-8  hours of restful sleep nightly.  A/P: Stable. Continue current medications.    #Osteopenia- consider updated bone density at next visit  #fibromyalgia- doing well with no side effects on amitriptyline- if she has falls or worsening side effects should let us know   Recommended follow up: 57-month physical or sooner if needed Future Appointments  Date Time Provider [place]  12/26/2018  9:40 AM Lelon Perla, MD [facility]  02/15/2019  9:00 AM Tanda Rockers, MD [facility] None  03/15/2019  8:40 AM Marin Olp, MD [facility]   Lab/Order associations: fasting   ICD-10-CM   1. Essential hypertension  I10 Comprehensive metabolic panel  2. Hyperlipidemia, unspecified hyperlipidemia type  E78.5 Lipid panel    Comprehensive metabolic panel  3. Hypothyroidism, unspecified type  E03.9 TSH  4. Coronary artery disease due to lipid rich plaque  I25.10    I25.83   5. Hyperglycemia  R73.9 Hemoglobin A1c  6. Osteopenia of right hip  M85.851     Meds ordered this encounter  Medications  . ALPRAZolam (XANAX) 0.25 MG tablet    Sig: TAKE 1 TABLET BY MOUTH AT BEDTIME AS NEEDED FOR SLEEP    Dispense:  90 tablet    Refill:  1   Return precautions advised.  Garret Reddish, MD

## 2018-09-12 ENCOUNTER — Other Ambulatory Visit: Payer: Self-pay | Admitting: Family Medicine

## 2018-09-20 ENCOUNTER — Other Ambulatory Visit: Payer: Self-pay | Admitting: Internal Medicine

## 2018-09-20 MED ORDER — BUDESONIDE-FORMOTEROL FUMARATE 160-4.5 MCG/ACT IN AERO: INHALATION_SPRAY | RESPIRATORY_TRACT | 5 refills | Status: DC

## 2018-10-05 ENCOUNTER — Ambulatory Visit (INDEPENDENT_AMBULATORY_CARE_PROVIDER_SITE_OTHER): Payer: Medicare Other

## 2018-10-05 ENCOUNTER — Other Ambulatory Visit: Payer: Self-pay

## 2018-10-05 DIAGNOSIS — Z23 Encounter for immunization: Secondary | ICD-10-CM

## 2018-10-10 DIAGNOSIS — H532 Diplopia: Secondary | ICD-10-CM | POA: Diagnosis not present

## 2018-10-10 DIAGNOSIS — E119 Type 2 diabetes mellitus without complications: Secondary | ICD-10-CM | POA: Diagnosis not present

## 2018-10-10 DIAGNOSIS — H52203 Unspecified astigmatism, bilateral: Secondary | ICD-10-CM | POA: Diagnosis not present

## 2018-10-10 DIAGNOSIS — H5203 Hypermetropia, bilateral: Secondary | ICD-10-CM | POA: Diagnosis not present

## 2018-10-10 LAB — HM DIABETES EYE EXAM

## 2018-10-12 ENCOUNTER — Other Ambulatory Visit: Payer: Self-pay

## 2018-10-12 ENCOUNTER — Ambulatory Visit (INDEPENDENT_AMBULATORY_CARE_PROVIDER_SITE_OTHER): Payer: Medicare Other | Admitting: Internal Medicine

## 2018-10-12 ENCOUNTER — Ambulatory Visit: Payer: Medicare Other

## 2018-10-12 ENCOUNTER — Encounter: Payer: Self-pay | Admitting: Internal Medicine

## 2018-10-12 ENCOUNTER — Ambulatory Visit: Payer: Medicare Other | Admitting: Family Medicine

## 2018-10-12 DIAGNOSIS — J479 Bronchiectasis, uncomplicated: Secondary | ICD-10-CM | POA: Diagnosis not present

## 2018-10-12 MED ORDER — PREDNISONE 10 MG PO TABS: ORAL_TABLET | ORAL | 0 refills | Status: DC

## 2018-10-12 MED ORDER — CEFDINIR 300 MG PO CAPS: 300.0000 mg | ORAL_CAPSULE | Freq: Two times a day (BID) | ORAL | 0 refills | Status: DC

## 2018-10-12 NOTE — Assessment & Plan Note (Signed)
Onset of symptoms around 2002     - PFT's 10/14/2010  FEV1  1.25 (68%) and ratio 58% and DLCO 90%     - PFT's 10/29/2011  FEV1  1.33 (74%) and ratio 57 % and DLCO 93%    - PFTs 08/14/2013   FEV1  1.16 (60%) and ratio 61 with dlco 83%     - Flutter valve added 09/03/14      - alpha one   01/14/2015 >  MM, level 147     - IgE  01/14/15  11 - CT chest 04/17/15 Marked chronic bronchiectasis and volume loss in the right middle lobe with milder bronchiectasis, bronchial wall thickening, and nodular densities throughout the right upper and right lower lobe suggestive of chr onic endobronchial/atypical mycobacterial infection. - 10/16/2015 changed to symbicort 80 2bid (? Higher doses contributing to w MAI /freq of infections)   - 03/02/2016  After extensive coaching HFA effectiveness =    90%  - 05/04/2016 VEST stared around May 25 2016 - try off zmax 08/03/2016 > no change clinically as of 11/05/2016  - 02/14/2017 cycles of omnicef x 7 days prn purulent sputum  PFT's  05/16/2017  FEV1 0.89 (47 % ) ratio 60  p 12 % improvement from saba p nothing prior to study   - 05/16/2017  After extensive coaching inhaler device  effectiveness =    90%  - 05/16/17 quant Ig's ok x M slt low (not acutely ill at the time)  -zmax daily restarted 05/16/17 and clinically improved 08/02/2017 using prn omnicef to supplment but did not continue zmax as rec  - 11/14/2017  After extensive coaching inhaler device,  effectiveness =    90% baseline  - restart zmax daily 11/14/2017 > improved 02/14/2018   Acute flare with uri/ purulent tracheobronchitis typical of obst bronchiectasis symptoms in past but can't excluded covid19 so rec rx as bronchiectasis/ AB  flare with saba up to 2 q 4 hprn, mucinex dm and flutter valve, omnicef 300 mg bid x 10 days and Prednisone 10 mg take  4 each am x 2 days,   2 each am x 2 days,  1 each am x 2 days and stop    > >> also if not comfortable at rest p saba then needs to go to ER.

## 2018-10-12 NOTE — Patient Instructions (Addendum)
Only use your albuterol as a rescue medication to be used if you can't catch your breath by resting or doing a relaxed purse lip breathing pattern.  - The less you use it, the better it will work when you need it. - Ok to use up to 2 puffs  every 4 hours if you must but call for immediate appointment if use goes up over your usual need - Don't leave home without it !!  (think of it like the spare tire for your car)    Prednisone 10 mg take  4 each am x 2 days,   2 each am x 2 days,  1 each am x 2 days and stop   omnicef 300 mg twice daily x 10 days   If not comfortable at rest after albuterol x 2 pffs every 4 hours you will need to go to ER
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
## 2018-10-19 ENCOUNTER — Ambulatory Visit: Payer: Medicare Other

## 2018-10-19 ENCOUNTER — Other Ambulatory Visit: Payer: Self-pay | Admitting: Internal Medicine

## 2018-10-19 MED ORDER — AZITHROMYCIN 250 MG PO TABS: 250.0000 mg | ORAL_TABLET | Freq: Every day | ORAL | 5 refills | Status: DC

## 2018-10-20 ENCOUNTER — Ambulatory Visit: Payer: Medicare Other | Admitting: Cardiology

## 2018-10-31 DIAGNOSIS — R05 Cough: Secondary | ICD-10-CM | POA: Diagnosis not present

## 2018-10-31 DIAGNOSIS — R0602 Shortness of breath: Secondary | ICD-10-CM | POA: Diagnosis not present

## 2018-10-31 DIAGNOSIS — R918 Other nonspecific abnormal finding of lung field: Secondary | ICD-10-CM | POA: Diagnosis not present

## 2018-10-31 DIAGNOSIS — R0981 Nasal congestion: Secondary | ICD-10-CM | POA: Diagnosis not present

## 2018-10-31 NOTE — Telephone Encounter (Signed)
Best to bring her in if covid screens are neg - see as last pt of the day and strict face masking   If any covid screens positive better to send to ER for cxr and same day covid testing

## 2018-10-31 NOTE — Telephone Encounter (Signed)
Dr. Melvyn Novas please see pt's email. She is requesting a CXR this week. Thanks.

## 2018-10-31 NOTE — Telephone Encounter (Signed)
Called and spoke to pt. Reviewed covid screen, pt is having headache and body aches along with the symptoms she mentioned in her email (cough, fatigue, SOB). Advised pt to go to ED for eval. Pt verbalized understanding but states she does not want to go to the ED. Pt states she is willing to go to an urgent care. Nothing further needed at this time. Will sign off.

## 2018-11-17 ENCOUNTER — Other Ambulatory Visit: Payer: Self-pay | Admitting: *Deleted

## 2018-11-17 MED ORDER — ATORVASTATIN CALCIUM 80 MG PO TABS: 80.0000 mg | ORAL_TABLET | Freq: Every day | ORAL | 0 refills | Status: DC

## 2018-11-17 MED ORDER — NEBIVOLOL HCL 2.5 MG PO TABS: 2.5000 mg | ORAL_TABLET | Freq: Every day | ORAL | 0 refills | Status: DC

## 2018-11-17 NOTE — Telephone Encounter (Signed)
Rx has been sent to the pharmacy electronically. ° °

## 2018-12-15 NOTE — Progress Notes (Signed)
HPI: FU CAD; history of coronary artery disease status post coronary artery bypass graft in 2002. Carotid Dopplers December 2015 showed no significant obstruction. Echocardiogram March 2017 showed normal LV function and trace aortic insufficiency. CTA March 2017 showed no pulmonary embolus. There was bronchiectasis noted. Nuclear study April 2017 showed ejection fraction 64% and no ischemia or infarction. Since last seenshe has had difficulties with her lungs and has been treated for infection and bronchiectasis.  She has baseline dyspnea unchanged.  No chest pain, palpitations, syncope or pedal edema.  Current Outpatient Medications  Medication Sig Dispense Refill  . acetaminophen (TYLENOL) 650 MG CR tablet every 8 (eight) hours as needed for pain.     Marland Kitchen albuterol (PROAIR HFA) 108 (90 Base) MCG/ACT inhaler Inhale 2 puffs into the lungs every 6 (six) hours as needed for wheezing or shortness of breath. 1 Inhaler 1  . ALPRAZolam (XANAX) 0.25 MG tablet TAKE 1 TABLET BY MOUTH AT BEDTIME AS NEEDED FOR SLEEP 90 tablet 1  . amitriptyline (ELAVIL) 50 MG tablet take 1 tablet by mouth at bedtime 90 tablet 3  . aspirin EC 81 MG tablet Take 81 mg by mouth every other day.    Marland Kitchen atorvastatin (LIPITOR) 80 MG tablet Take 1 tablet (80 mg total) by mouth daily. 90 tablet 0  . azithromycin (ZITHROMAX) 250 MG tablet Take 1 tablet (250 mg total) by mouth daily. 30 tablet 5  . budesonide-formoterol (SYMBICORT) 160-4.5 MCG/ACT inhaler INHALE 2 PUFFS FIRST THING IN THE MORNING AND THEN ANOTHER 2 PUFFS ABOUT 12 HOURS LATER 10.2 g 5  . cefdinir (OMNICEF) 300 MG capsule Take 1 capsule (300 mg total) by mouth 2 (two) times daily. 20 capsule 0  . Dextromethorphan-Guaifenesin (MUCINEX DM) 30-600 MG TB12 Take 2 tablets by mouth 2 (two) times daily. Patient takes 1200mg     . Melatonin 5 MG TABS Take 5 mg by mouth at bedtime.    . metFORMIN (GLUCOPHAGE) 500 MG tablet Take 1 tablet (500 mg total) by mouth 2 (two) times  daily with a meal. 180 tablet 3  . nebivolol (BYSTOLIC) 2.5 MG tablet Take 1 tablet (2.5 mg total) by mouth daily. 90 tablet 0  . Respiratory Therapy Supplies (FLUTTER) DEVI Use as directed 1 each 0  . SYNTHROID 75 MCG tablet Take 1 tablet (75 mcg total) by mouth daily before breakfast. 90 tablet 3  . telmisartan (MICARDIS) 80 MG tablet TAKE 1 TABLET(80 MG) BY MOUTH DAILY (Patient taking differently: 40 mg. ) 30 tablet 6  . predniSONE (DELTASONE) 10 MG tablet Take  4 each am x 2 days,   2 each am x 2 days,  1 each am x 2 days and stop (Patient not taking: Reported on 12/26/2018) 14 tablet 0   No current facility-administered medications for this visit.      Past Medical History:  Diagnosis Date  . Bronchiectasis    oxygen at night in the past  . CAD (coronary artery disease)   . Diverticulitis 2014  . GERD (gastroesophageal reflux disease)   . History of shingles 04/2012  . HTN (hypertension)   . Hyperlipidemia   . Hypothyroidism   . MAI (mycobacterium avium-intracellulare) ([place])   . Neuritis of upper extremity     Past Surgical History:  Procedure Laterality Date  . CORONARY ARTERY BYPASS GRAFT  2004   x3 CABG    Social History   Socioeconomic History  . Marital status: Single    Spouse name: Not  on file  . Number of children: Not on file  . Years of education: Not on file  . Highest education level: Not on file  Occupational History  . Not on file  Social Needs  . Financial resource strain: Not on file  . Food insecurity    Worry: Not on file    Inability: Not on file  . Transportation needs    Medical: Not on file    Non-medical: Not on file  Tobacco Use  . Smoking status: Former Smoker    Packs/day: 1.00    Years: 20.00    Pack years: 20.00    Types: Cigarettes    Quit date: 01/25/1970    Years since quitting: 48.9  . Smokeless tobacco: Never Used  Substance and Sexual Activity  . Alcohol use: No  . Drug use: No  . Sexual activity: Not on file  Lifestyle   . Physical activity    Days per week: Not on file    Minutes per session: Not on file  . Stress: Not on file  Relationships  . Social Herbalist on phone: Not on file    Gets together: Not on file    Attends religious service: Not on file    Active member of club or organization: Not on file    Attends meetings of clubs or organizations: Not on file    Relationship status: Not on file  . Intimate partner violence    Fear of current or ex partner: Not on file    Emotionally abused: Not on file    Physically abused: Not on file    Forced sexual activity: Not on file  Other Topics Concern  . Not on file  Social History Narrative   Family: Single never married, no children, cat and rehabs turtles and tortoise   Went to [employer] in [employer] and social work, some business courses at [employer], EMT for 6 years.    LIves alone. Completely independent.    Lives in retirement community.       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 Colon cancer Mother   . Hyperlipidemia Mother   . Hypertension Mother   . Heart disease Mother   . Stroke Mother   . Diabetes Mother   . Colon cancer Father   . Arthritis Father   . Hyperlipidemia Father   . Hypertension Father   . Heart disease Father   . Stroke Father   . Atopy Neg Hx     ROS: no fevers or chills, productive cough, hemoptysis, dysphasia, odynophagia, melena, hematochezia, dysuria, hematuria, rash, seizure activity, orthopnea, PND, pedal edema, claudication. Remaining systems are negative.  Physical Exam: Well-developed well-nourished in no acute distress.  Skin is warm and dry.  HEENT is normal.  Neck is supple.  Chest is clear to auscultation with normal expansion.  Cardiovascular exam is regular rate and rhythm.  Abdominal exam nontender or distended. No  masses palpated. Extremities show no edema. neuro grossly intact  ECG-sinus rhythm at a rate of 76, no ST changes.  Personally reviewed  A/P  1 coronary artery disease-patient is status post coronary artery bypass and graft.  She has not had recurrent symptoms.  Continue aspirin and statin.  2 history of carotid artery disease-most recent Doppler showed no significant obstruction.  3 hypertension-blood pressure elevated.  Increase telmisartan to 80 mg daily.  Check potassium and renal function in 1 week.  4 hyperlipidemia-continue statin.  Kirk Ruths, MD

## 2018-12-26 ENCOUNTER — Other Ambulatory Visit: Payer: Self-pay

## 2018-12-26 ENCOUNTER — Ambulatory Visit: Payer: Medicare Other | Admitting: Cardiology

## 2018-12-26 ENCOUNTER — Encounter: Payer: Self-pay | Admitting: Cardiology

## 2018-12-26 VITALS — BP 138/72 | HR 87 | Temp 97.3°F | Ht 64.0 in | Wt 127.4 lb

## 2018-12-26 DIAGNOSIS — E7800 Pure hypercholesterolemia, unspecified: Secondary | ICD-10-CM

## 2018-12-26 DIAGNOSIS — I10 Essential (primary) hypertension: Secondary | ICD-10-CM | POA: Diagnosis not present

## 2018-12-26 DIAGNOSIS — I2510 Atherosclerotic heart disease of native coronary artery without angina pectoris: Secondary | ICD-10-CM

## 2018-12-26 MED ORDER — TELMISARTAN 80 MG PO TABS: 80.0000 mg | ORAL_TABLET | Freq: Every day | ORAL | 3 refills | Status: DC

## 2018-12-26 MED ORDER — ATORVASTATIN CALCIUM 80 MG PO TABS: 80.0000 mg | ORAL_TABLET | Freq: Every day | ORAL | 0 refills | Status: DC

## 2018-12-26 MED ORDER — NEBIVOLOL HCL 2.5 MG PO TABS: 2.5000 mg | ORAL_TABLET | Freq: Every day | ORAL | 0 refills | Status: DC

## 2018-12-26 NOTE — Patient Instructions (Signed)
Medication Instructions:  INCREASE TELMISARTAN TO 80 MG ONCE DAILY= 2 OF THE 40 MG TABLETS ONCE DAILY  *If you need a refill on your cardiac medications before your next appointment, please call your pharmacy*  Lab Work: Your physician recommends that you return for lab work in: [place]  If you have labs (blood work) drawn today and your tests are completely normal, you will receive your results only by: Marland Kitchen MyChart Message (if you have MyChart) OR . A paper copy in the mail If you have any lab test that is abnormal or we need to change your treatment, we will call you to review the results.  Follow-Up: At [facility], you and your health needs are our priority.  As part of our continuing mission to provide you with exceptional heart care, we have created designated Provider Care Teams.  These Care Teams include your primary Cardiologist (physician) and Advanced Practice Providers (APPs -  Physician Assistants and Nurse Practitioners) who all work together to provide you with the care you need, when you need it.  Your next appointment:   12 month(s)  The format for your next appointment:   Either In Person or Virtual  Provider:   You may see Kirk Ruths, MD or one of the following Advanced Practice Providers on your designated Care Team:    Kerin Ransom, PA-C  [place], [place]  Coletta Memos, [place]

## 2018-12-29 DIAGNOSIS — Z1231 Encounter for screening mammogram for malignant neoplasm of breast: Secondary | ICD-10-CM | POA: Diagnosis not present

## 2019-01-03 ENCOUNTER — Encounter: Payer: Self-pay | Admitting: Family Medicine

## 2019-01-03 LAB — HM MAMMOGRAPHY

## 2019-01-04 DIAGNOSIS — I10 Essential (primary) hypertension: Secondary | ICD-10-CM | POA: Diagnosis not present

## 2019-01-05 LAB — BASIC METABOLIC PANEL
BUN/Creatinine Ratio: 14 (ref 12–28)
BUN: 12 mg/dL (ref 8–27)
CO2: 24 mmol/L (ref 20–29)
Calcium: 9.5 mg/dL (ref 8.7–10.3)
Chloride: 102 mmol/L (ref 96–106)
Creatinine, Ser: 0.85 mg/dL (ref 0.57–1.00)
GFR calc Af Amer: 78 mL/min/{1.73_m2} (ref >59)
GFR calc non Af Amer: 67 mL/min/{1.73_m2} (ref >59)
Glucose: 146 mg/dL — ABNORMAL HIGH (ref 65–99)
Potassium: 4.5 mmol/L (ref 3.5–5.2)
Sodium: 143 mmol/L (ref 134–144)

## 2019-01-26 ENCOUNTER — Encounter: Payer: Self-pay | Admitting: Family Medicine

## 2019-02-13 ENCOUNTER — Other Ambulatory Visit: Payer: Self-pay | Admitting: Family Medicine

## 2019-02-14 DIAGNOSIS — M79642 Pain in left hand: Secondary | ICD-10-CM | POA: Diagnosis not present

## 2019-02-14 DIAGNOSIS — M65849 Other synovitis and tenosynovitis, unspecified hand: Secondary | ICD-10-CM | POA: Diagnosis not present

## 2019-02-14 DIAGNOSIS — M1812 Unilateral primary osteoarthritis of first carpometacarpal joint, left hand: Secondary | ICD-10-CM | POA: Diagnosis not present

## 2019-02-14 DIAGNOSIS — M79644 Pain in right finger(s): Secondary | ICD-10-CM | POA: Diagnosis not present

## 2019-02-15 ENCOUNTER — Ambulatory Visit: Payer: Medicare Other | Admitting: Internal Medicine

## 2019-02-16 ENCOUNTER — Ambulatory Visit (INDEPENDENT_AMBULATORY_CARE_PROVIDER_SITE_OTHER): Payer: Medicare Other

## 2019-02-16 ENCOUNTER — Encounter: Payer: Self-pay | Admitting: Internal Medicine

## 2019-02-16 ENCOUNTER — Ambulatory Visit: Payer: Medicare Other | Admitting: Internal Medicine

## 2019-02-16 ENCOUNTER — Other Ambulatory Visit: Payer: Self-pay

## 2019-02-16 DIAGNOSIS — R05 Cough: Secondary | ICD-10-CM | POA: Diagnosis not present

## 2019-02-16 DIAGNOSIS — J479 Bronchiectasis, uncomplicated: Secondary | ICD-10-CM | POA: Diagnosis not present

## 2019-02-16 DIAGNOSIS — A310 Pulmonary mycobacterial infection: Secondary | ICD-10-CM | POA: Diagnosis not present

## 2019-02-16 NOTE — Patient Instructions (Signed)
Please remember to go to the  x-ray department  for your tests - we will call you with the results when they are available     Please schedule a follow up visit in 6  months but call sooner if needed  

## 2019-02-16 NOTE — Assessment & Plan Note (Signed)
Onset of symptoms around 2002     - PFT's 10/14/2010  FEV1  1.25 (68%) and ratio 58% and DLCO 90%     - PFT's 10/29/2011  FEV1  1.33 (74%) and ratio 57 % and DLCO 93%    - PFTs 08/14/2013   FEV1  1.16 (60%) and ratio 61 with dlco 83%     - Flutter valve added 09/03/14      - alpha one   01/14/2015 >  MM, level 147     - IgE  01/14/15  11 - CT chest 04/17/15 Marked chronic bronchiectasis and volume loss in the right middle lobe with milder bronchiectasis, bronchial wall thickening, and nodular densities throughout the right upper and right lower lobe suggestive of chr onic endobronchial/atypical mycobacterial infection. - 10/16/2015 changed to symbicort 80 2bid (? Higher doses contributing to w MAI /freq of infections)   - 03/02/2016  After extensive coaching HFA effectiveness =    90%  - 05/04/2016 VEST stared around May 25 2016 - try off zmax 08/03/2016 > no change clinically as of 11/05/2016  - 02/14/2017 cycles of omnicef x 7 days prn purulent sputum  PFT's  05/16/2017  FEV1 0.89 (47 % ) ratio 60  p 12 % improvement from saba p nothing prior to study   - 05/16/2017  After extensive coaching inhaler device  effectiveness =    90%  - 05/16/17 quant Ig's ok x M slt low (not acutely ill at the time)  -zmax daily restarted 05/16/17 and clinically improved 08/02/2017 using prn omnicef to supplment but did not continue zmax as rec  - 11/14/2017  After extensive coaching inhaler device,  effectiveness =    90% baseline  - restart zmax daily 11/14/2017 > improved 02/14/2018   Adequate control on present rx, reviewed in detail with pt > no change in rx needed           Each maintenance medication was reviewed in detail including emphasizing most importantly the difference between maintenance and prns and under what circumstances the prns are to be triggered using an action plan format where appropriate.  Total time for H and P, chart review, counseling, teaching device and generating customized AVS unique to  this office visit / charting =20
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
## 2019-02-16 NOTE — Progress Notes (Signed)
Spoke with pt and notified of results per Dr. Wert. Pt verbalized understanding.

## 2019-02-17 ENCOUNTER — Encounter: Payer: Self-pay | Admitting: Internal Medicine

## 2019-02-17 NOTE — Assessment & Plan Note (Signed)
08/08/07 FOB with classic cobblestoning and MAI on culture.  - Rx 08/2007   To 08/2009 with ETH/Zmax - restarted empirically 05/31/14 > stopped 09/03/14 no benefit perceived, no change on cxr  - restart zmax daily 03/02/2016 > d/c 08/03/2016 > no change in symptoms or freq exac - -zmax daily restarted 05/16/17 and clinically improved 08/02/2017 using prn omnicef to supplment   - flare 09/2018 did not respond to omnicef but did respond to augmentin but neither effective against MAI or resistant gnr's so no change in rx needed   02/16/2019 back to baseline clinically and radiographically

## 2019-02-21 ENCOUNTER — Encounter: Payer: Self-pay | Admitting: Family Medicine

## 2019-02-22 ENCOUNTER — Other Ambulatory Visit: Payer: Self-pay

## 2019-02-22 DIAGNOSIS — Z780 Asymptomatic menopausal state: Secondary | ICD-10-CM

## 2019-03-12 DIAGNOSIS — M8589 Other specified disorders of bone density and structure, multiple sites: Secondary | ICD-10-CM | POA: Diagnosis not present

## 2019-03-12 LAB — HM DEXA SCAN

## 2019-03-14 ENCOUNTER — Telehealth: Payer: Self-pay | Admitting: Family Medicine

## 2019-03-14 NOTE — Telephone Encounter (Signed)
Patient wanted to cancel her appointment for tomorrow due to the weather, and got her rescheduled for 05/15/19 @ 120 and asked if she could come in at an earlier date for her blood work.

## 2019-03-14 NOTE — Telephone Encounter (Signed)
Yes you can schedule her to come in a few days prior for blood work .

## 2019-03-15 ENCOUNTER — Encounter: Payer: Medicare Other | Admitting: Family Medicine

## 2019-03-20 ENCOUNTER — Other Ambulatory Visit: Payer: Self-pay

## 2019-03-20 ENCOUNTER — Encounter: Payer: Self-pay | Admitting: Family Medicine

## 2019-03-20 MED ORDER — ALPRAZOLAM 0.25 MG PO TABS: ORAL_TABLET | ORAL | 0 refills | Status: DC

## 2019-03-20 MED ORDER — SYNTHROID 75 MCG PO TABS: 75.0000 ug | ORAL_TABLET | Freq: Every day | ORAL | 3 refills | Status: DC

## 2019-03-20 MED ORDER — AMITRIPTYLINE HCL 50 MG PO TABS: ORAL_TABLET | ORAL | 3 refills | Status: DC

## 2019-03-20 MED ORDER — BUDESONIDE-FORMOTEROL FUMARATE 160-4.5 MCG/ACT IN AERO: INHALATION_SPRAY | RESPIRATORY_TRACT | 5 refills | Status: DC

## 2019-03-28 ENCOUNTER — Encounter: Payer: Self-pay | Admitting: Family Medicine

## 2019-04-10 DIAGNOSIS — Z85828 Personal history of other malignant neoplasm of skin: Secondary | ICD-10-CM | POA: Diagnosis not present

## 2019-04-10 DIAGNOSIS — B078 Other viral warts: Secondary | ICD-10-CM | POA: Diagnosis not present

## 2019-04-10 DIAGNOSIS — D692 Other nonthrombocytopenic purpura: Secondary | ICD-10-CM | POA: Diagnosis not present

## 2019-04-10 DIAGNOSIS — L718 Other rosacea: Secondary | ICD-10-CM | POA: Diagnosis not present

## 2019-04-10 DIAGNOSIS — L298 Other pruritus: Secondary | ICD-10-CM | POA: Diagnosis not present

## 2019-04-10 DIAGNOSIS — L570 Actinic keratosis: Secondary | ICD-10-CM | POA: Diagnosis not present

## 2019-04-12 ENCOUNTER — Encounter: Payer: Self-pay | Admitting: Family Medicine

## 2019-04-16 ENCOUNTER — Ambulatory Visit (INDEPENDENT_AMBULATORY_CARE_PROVIDER_SITE_OTHER): Payer: Medicare Other | Admitting: Family Medicine

## 2019-04-16 ENCOUNTER — Encounter: Payer: Self-pay | Admitting: Family Medicine

## 2019-04-16 ENCOUNTER — Other Ambulatory Visit: Payer: Self-pay

## 2019-04-16 VITALS — BP 126/62 | HR 77 | Temp 98.3°F | Ht 64.0 in | Wt 121.0 lb

## 2019-04-16 DIAGNOSIS — E785 Hyperlipidemia, unspecified: Secondary | ICD-10-CM

## 2019-04-16 DIAGNOSIS — I2510 Atherosclerotic heart disease of native coronary artery without angina pectoris: Secondary | ICD-10-CM

## 2019-04-16 DIAGNOSIS — R739 Hyperglycemia, unspecified: Secondary | ICD-10-CM | POA: Diagnosis not present

## 2019-04-16 DIAGNOSIS — E039 Hypothyroidism, unspecified: Secondary | ICD-10-CM

## 2019-04-16 DIAGNOSIS — I2583 Coronary atherosclerosis due to lipid rich plaque: Secondary | ICD-10-CM

## 2019-04-16 DIAGNOSIS — I10 Essential (primary) hypertension: Secondary | ICD-10-CM

## 2019-04-16 LAB — POCT GLYCOSYLATED HEMOGLOBIN (HGB A1C): Hemoglobin A1C: 5.5 % (ref 4.0–5.6)

## 2019-04-16 NOTE — Progress Notes (Signed)
Phone [phone] In person visit   Subjective:   Belinda Day is a 74 y.o. year old very pleasant female patient who presents for/with See problem oriented charting Chief Complaint  Patient presents with  . Follow-up  . a1c    This visit occurred during the SARS-CoV-2 public health emergency.  Safety protocols were in place, including screening questions prior to the visit, additional usage of staff PPE, and extensive cleaning of exam room while observing appropriate contact time as indicated for disinfecting solutions.   Past Medical History-  Patient Active Problem List   Diagnosis Date Noted  . Osteopenia 08/14/2011    Priority: High  . CAD (coronary artery disease) s/p CABG 07/03/2007    Priority: High  . Insulin resistance 09/07/2017    Priority: Medium  . Hyperglycemia 08/05/2014    Priority: Medium  . Insomnia 02/12/2013    Priority: Medium  . Nocturnal hypoxemia 12/12/2012    Priority: Medium  . Fibromyalgia 12/28/2011    Priority: Medium  . Hypothyroidism 04/06/2010    Priority: Medium  . MAI (mycobacterium avium-intracellulare) ([place]) 11/19/2009    Priority: Medium  . Hyperlipemia 07/03/2007    Priority: Medium  . Essential hypertension 07/03/2007    Priority: Medium  . Obstructive bronchiectasis ([place]) with GOLD II/III criteria 07/03/2007    Priority: Medium  . Former smoker 08/05/2014    Priority: Low  . Constipation 05/02/2014    Priority: Low  . History of colonic polyps 05/02/2014    Priority: Low  . GERD (gastroesophageal reflux disease) 02/24/2014    Priority: Low  . Hemoptysis 02/24/2014    Priority: Low  . Benign paroxysmal positional vertigo 04/18/2013    Priority: Low  . Diverticulitis 12/21/2012    Priority: Low  . Cystitis 11/05/2016  . Cerebrovascular disease 01/31/2015    Medications- reviewed and updated Current Outpatient Medications  Medication Sig Dispense Refill  . acetaminophen (TYLENOL) 650 MG CR tablet every 8 (eight)  hours as needed for pain.     Marland Kitchen albuterol (PROAIR HFA) 108 (90 Base) MCG/ACT inhaler Inhale 2 puffs into the lungs every 6 (six) hours as needed for wheezing or shortness of breath. 1 Inhaler 1  . ALPRAZolam (XANAX) 0.25 MG tablet TAKE 1 TABLET BY MOUTH AT BEDTIME AS NEEDED FOR SLEEP 90 tablet 0  . amitriptyline (ELAVIL) 50 MG tablet take 1 tablet by mouth at bedtime 90 tablet 3  . aspirin EC 81 MG tablet Take 81 mg by mouth every other day.    Marland Kitchen atorvastatin (LIPITOR) 80 MG tablet Take 1 tablet (80 mg total) by mouth daily. 90 tablet 0  . azithromycin (ZITHROMAX) 250 MG tablet Take 1 tablet (250 mg total) by mouth daily. 30 tablet 5  . budesonide-formoterol (SYMBICORT) 160-4.5 MCG/ACT inhaler INHALE 2 PUFFS FIRST THING IN THE MORNING AND THEN ANOTHER 2 PUFFS ABOUT 12 HOURS LATER 10.2 g 5  . Dextromethorphan-Guaifenesin (MUCINEX DM) 30-600 MG TB12 Take 2 tablets by mouth 2 (two) times daily. Patient takes 1200mg     . Melatonin 5 MG TABS Take 5 mg by mouth at bedtime.    . metFORMIN (GLUCOPHAGE) 500 MG tablet TAKE 1 TABLET(500 MG) BY MOUTH TWICE DAILY WITH A MEAL 180 tablet 3  . nebivolol (BYSTOLIC) 2.5 MG tablet Take 1 tablet (2.5 mg total) by mouth daily. 90 tablet 0  . Respiratory Therapy Supplies (FLUTTER) DEVI Use as directed 1 each 0  . SYNTHROID 75 MCG tablet Take 1 tablet (75 mcg total) by mouth  daily before breakfast. 90 tablet 3  . telmisartan (MICARDIS) 80 MG tablet Take 1 tablet (80 mg total) by mouth daily. 90 tablet 3   No current facility-administered medications for this visit.     Objective:  BP 126/62   Pulse 77   Temp 98.3 F (36.8 C)   Ht 5\' 4"  (1.626 m)   Wt 121 lb (54.9 kg)   SpO2 96%   BMI 20.77 kg/m  Gen: NAD, resting comfortably CV: RRR no murmurs rubs or gallops Lungs: CTAB no crackles, wheeze, rhonchi Ext: no edema Skin: warm, dry     Assessment and Plan   # Hyperglycemia/insulin resistance/prediabetes S: Exercise and diet- Doing her own gardening and  active building rock wall. Being outside really helps her.  Denies any recent weight gain CBGs-December 10th blood drawn by Dr. Stanford Breed blood was 146. Home #s 125-130 not fasting Medications-compliant with Metformin 5 mg twice a day Lab Results  Component Value Date   HGBA1C point-of-care 5.5 04/16/2019   HGBA1C 6.2 09/08/2018   HGBA1C 6.0 03/10/2018    A/P: [facility] alerted [facility] that she needed further evaluation based on elevated blood sugars.  I reviewed with patient that her home blood sugars are actually consistent with her A1c-typically our point-of-care machine is about 0.5 lower than phlebotomy labs but an A1c of 6 would be an average blood sugar around 120-that is pretty consistent with what her numbers have been. -Recommended continued efforts for healthy eating/regular exercise as well as continuation of Metformin  % Pulmonary-MAI and obstructive bronchiectasis-follows with Dr. Melvyn Novas S: Patient follows with pulmonary clinic.  On regular Symbicort.  Albuterol as needed.  Uses azithromycin daily A/P: Patient is particularly concerned about her risk with COVID-19 due to pulmonary issues.  She is still being cautious but she honestly does not mind this and denies any depression or anxiety with Covid  % #CAD status post CABG-follows with Dr. Jones Broom S: Compliant with atorvastatin 80 mg and aspirin 81 mg.  LDL goal under 70 A/P: Patient asymptomatic from CAD perspective.  For hyperlipidemia-she would like to update a full lipid panel when she returns in a few weeks-we discussed this would be less than 1 year but she prefers to simply have full panel updated   #Hypertension S: Compliant with Bystolic 2.5 mg, telmisartan 40 mg-takes half of an 80 mg pill.Home #s 130s-150s/60-80 A/P: Excellent control in office -continue current medication.  Few high readings at home-she will keep an eye on this-goal is for average blood pressure to at least be less than  140/90 at her age  #Hypothyroidism  S: Compliant with Synthroid 75 mcg A/P: Hopefully remains controlled-update TSH with upcoming labs  Recommended follow up: Patient wants to keep follow-up next month and have blood work beforehand and review blood work at that time Future Appointments  Date Time Provider [place]  05/08/2019  9:00 AM [facility]  05/15/2019  1:20 PM Marin Olp, MD [facility]  08/16/2019  9:00 AM Tanda Rockers, MD [facility] None    Lab/Order associations:   ICD-10-CM   1. Hyperglycemia  R73.9 POCT HgB A1C    Hemoglobin A1c  2. Hyperlipidemia, unspecified hyperlipidemia type  E78.5 CBC with Differential/Platelet    Comprehensive metabolic panel    Lipid panel  3. Hypothyroidism, unspecified type  E03.9 TSH  4. Coronary artery disease due to lipid rich plaque  I25.10    I25.83   5. Essential hypertension  I10  Return precautions advised.  Garret Reddish, MD

## 2019-04-16 NOTE — Patient Instructions (Addendum)
Health Maintenance Due  Topic Date Due  . TETANUS/TDAP - consider getting this at your pharmacy. If you get a cut/scrape in the yard- they will pay for one to be done here and you would be due for that.  04/06/2019   We ordered labs for your visit next month- you wanted to repeat cholesterol panel and a1c- we discussed insurance may not cover those so close together but you preferred to recheck  No changes in medicine  I am reassured by your home blood sugars and how they match up with the a1c from the office today- still in prediabetes range- remain active, keep eating healthy and maintaining weight (but I dont want you losing weight)

## 2019-05-08 ENCOUNTER — Ambulatory Visit (INDEPENDENT_AMBULATORY_CARE_PROVIDER_SITE_OTHER): Payer: Medicare Other

## 2019-05-08 ENCOUNTER — Other Ambulatory Visit (INDEPENDENT_AMBULATORY_CARE_PROVIDER_SITE_OTHER): Payer: Medicare Other

## 2019-05-08 ENCOUNTER — Other Ambulatory Visit: Payer: Self-pay

## 2019-05-08 VITALS — BP 116/64 | HR 66 | Temp 97.0°F | Ht 64.0 in | Wt 121.4 lb

## 2019-05-08 DIAGNOSIS — E785 Hyperlipidemia, unspecified: Secondary | ICD-10-CM | POA: Diagnosis not present

## 2019-05-08 DIAGNOSIS — E039 Hypothyroidism, unspecified: Secondary | ICD-10-CM | POA: Diagnosis not present

## 2019-05-08 DIAGNOSIS — R739 Hyperglycemia, unspecified: Secondary | ICD-10-CM | POA: Diagnosis not present

## 2019-05-08 DIAGNOSIS — Z0000 Encounter for general adult medical examination without abnormal findings: Secondary | ICD-10-CM

## 2019-05-08 LAB — CBC WITH DIFFERENTIAL/PLATELET
Basophils Absolute: 0 10*3/uL (ref 0.0–0.1)
Basophils Relative: 0.7 % (ref 0.0–3.0)
Eosinophils Absolute: 0.1 10*3/uL (ref 0.0–0.7)
Eosinophils Relative: 1.6 % (ref 0.0–5.0)
HCT: 39.3 % (ref 36.0–46.0)
Hemoglobin: 13.4 g/dL (ref 12.0–15.0)
Lymphocytes Relative: 21.4 % (ref 12.0–46.0)
Lymphs Abs: 1.2 10*3/uL (ref 0.7–4.0)
MCHC: 34 g/dL (ref 30.0–36.0)
MCV: 95.3 fl (ref 78.0–100.0)
Monocytes Absolute: 0.5 10*3/uL (ref 0.1–1.0)
Monocytes Relative: 8.9 % (ref 3.0–12.0)
Neutro Abs: 3.9 10*3/uL (ref 1.4–7.7)
Neutrophils Relative %: 67.4 % (ref 43.0–77.0)
Platelets: 270 10*3/uL (ref 150.0–400.0)
RBC: 4.13 Mil/uL (ref 3.87–5.11)
RDW: 13.1 % (ref 11.5–15.5)
WBC: 5.8 10*3/uL (ref 4.0–10.5)

## 2019-05-08 LAB — LIPID PANEL
Cholesterol: 123 mg/dL (ref 0–200)
HDL: 53.7 mg/dL (ref >39.00)
LDL Cholesterol: 57 mg/dL (ref 0–99)
NonHDL: 69.44
Total CHOL/HDL Ratio: 2
Triglycerides: 62 mg/dL (ref 0.0–149.0)
VLDL: 12.4 mg/dL (ref 0.0–40.0)

## 2019-05-08 LAB — COMPREHENSIVE METABOLIC PANEL
ALT: 16 U/L (ref 0–35)
AST: 23 U/L (ref 0–37)
Albumin: 4.1 g/dL (ref 3.5–5.2)
Alkaline Phosphatase: 84 U/L (ref 39–117)
BUN: 10 mg/dL (ref 6–23)
CO2: 26 mEq/L (ref 19–32)
Calcium: 8.8 mg/dL (ref 8.4–10.5)
Chloride: 102 mEq/L (ref 96–112)
Creatinine, Ser: 0.77 mg/dL (ref 0.40–1.20)
GFR: 72.86 mL/min (ref >60.00)
Glucose, Bld: 96 mg/dL (ref 70–99)
Potassium: 3.7 mEq/L (ref 3.5–5.1)
Sodium: 136 mEq/L (ref 135–145)
Total Bilirubin: 0.5 mg/dL (ref 0.2–1.2)
Total Protein: 6.1 g/dL (ref 6.0–8.3)

## 2019-05-08 LAB — TSH: TSH: 1.16 u[IU]/mL (ref 0.35–4.50)

## 2019-05-08 LAB — HEMOGLOBIN A1C: Hgb A1c MFr Bld: 6.1 % (ref 4.6–6.5)

## 2019-05-08 NOTE — Progress Notes (Signed)
Subjective:   Belinda Day is a 74 y.o. female who presents for Medicare Annual (Subsequent) preventive examination.  Review of Systems:   Cardiac Risk Factors include: advanced age (>66men, >61 women);hypertension;dyslipidemia    Objective:     Vitals: BP 116/64   Pulse 66   Temp (!) 97 F (36.1 C) (Temporal)   Ht 5\' 4"  (1.626 m)   Wt 121 lb 6.4 oz (55.1 kg)   SpO2 98%   BMI 20.84 kg/m   Body mass index is 20.84 kg/m.  Advanced Directives 08/09/2016 04/17/2015 12/21/2012  Does Patient Have a Medical Advance Directive? Yes Yes Patient has advance directive, copy not in chart  Type of Advance Directive [place];Living will [place];Living will [place];Living will  Does patient want to make changes to medical advance directive? No - Patient declined No - Patient declined No  Copy of Healthcare Power of Attorney in Chart? No - copy requested No - copy requested Copy requested from family  Pre-existing out of facility DNR order (yellow form or pink MOST form) - - No    Tobacco Social History   Tobacco Use  Smoking Status Former Smoker  . Packs/day: 1.00  . Years: 20.00  . Pack years: 20.00  . Types: Cigarettes  . Quit date: 01/25/1970  . Years since quitting: 49.3  Smokeless Tobacco Never Used     Counseling given: Not Answered   Clinical Intake:  Pre-visit preparation completed: Yes  Pain : No/denies pain  Diabetes: No  How often do you need to have someone help you when you read instructions, pamphlets, or other written materials from your doctor or pharmacy?: 1 - Never  Interpreter Needed?: No  Information entered by :: Denman George LPN  Past Medical History:  Diagnosis Date  . Bronchiectasis    oxygen at night in the past  . CAD (coronary artery disease)   . Diverticulitis 2014  . GERD (gastroesophageal reflux disease)   . History of shingles 04/2012  . HTN (hypertension)   .  Hyperlipidemia   . Hypothyroidism   . MAI (mycobacterium avium-intracellulare) ([place])   . Neuritis of upper extremity    Past Surgical History:  Procedure Laterality Date  . CORONARY ARTERY BYPASS GRAFT  2004   x3 CABG   Family History  Problem Relation Age of Onset  . Colon cancer Mother   . Hyperlipidemia Mother   . Hypertension Mother   . Heart disease Mother   . Stroke Mother   . Diabetes Mother   . Colon cancer Father   . Arthritis Father   . Hyperlipidemia Father   . Hypertension Father   . Heart disease Father   . Stroke Father   . Atopy Neg Hx    Social History   Socioeconomic History  . Marital status: Single    Spouse name: Not on file  . Number of children: Not on file  . Years of education: Not on file  . Highest education level: Not on file  Occupational History  . Occupation: Retired   Tobacco Use  . Smoking status: Former Smoker    Packs/day: 1.00    Years: 20.00    Pack years: 20.00    Types: Cigarettes    Quit date: 01/25/1970    Years since quitting: 49.3  . Smokeless tobacco: Never Used  Substance and Sexual Activity  . Alcohol use: No  . Drug use: No  . Sexual activity:  Not on file  Other Topics Concern  . Not on file  Social History Narrative   Family: Single never married, no children, cat and rehabs turtles and tortoise   Went to [employer] in [employer] and social work, some business courses at [employer], EMT for 6 years.    LIves alone. Completely independent.    Lives in retirement community.       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 Difficulty of Paying Living Expenses:   Food Insecurity:   . Worried About Charity fundraiser in the Last Year:   . Arboriculturist in the Last Year:   Transportation Needs:   . Film/video editor (Medical):   Marland Kitchen Lack of  Transportation (Non-Medical):   Physical Activity:   . Days of Exercise per Week:   . Minutes of Exercise per Session:   Stress:   . Feeling of Stress :   Social Connections:   . Frequency of Communication with Friends and Family:   . Frequency of Social Gatherings with Friends and Family:   . Attends Religious Services:   . Active Member of Clubs or Organizations:   . Attends Archivist Meetings:   Marland Kitchen Marital Status:     Outpatient Encounter Medications as of 05/08/2019  Medication Sig  . acetaminophen (TYLENOL) 650 MG CR tablet every 8 (eight) hours as needed for pain.   Marland Kitchen albuterol (PROAIR HFA) 108 (90 Base) MCG/ACT inhaler Inhale 2 puffs into the lungs every 6 (six) hours as needed for wheezing or shortness of breath.  . ALPRAZolam (XANAX) 0.25 MG tablet TAKE 1 TABLET BY MOUTH AT BEDTIME AS NEEDED FOR SLEEP  . amitriptyline (ELAVIL) 50 MG tablet take 1 tablet by mouth at bedtime  . aspirin EC 81 MG tablet Take 81 mg by mouth every other day.  Marland Kitchen atorvastatin (LIPITOR) 80 MG tablet Take 1 tablet (80 mg total) by mouth daily.  Marland Kitchen azithromycin (ZITHROMAX) 250 MG tablet Take 1 tablet (250 mg total) by mouth daily.  . budesonide-formoterol (SYMBICORT) 160-4.5 MCG/ACT inhaler INHALE 2 PUFFS FIRST THING IN THE MORNING AND THEN ANOTHER 2 PUFFS ABOUT 12 HOURS LATER  . Dextromethorphan-Guaifenesin (MUCINEX DM) 30-600 MG TB12 Take 2 tablets by mouth 2 (two) times daily. Patient takes 1200mg   . Melatonin 5 MG TABS Take 5 mg by mouth at bedtime.  . metFORMIN (GLUCOPHAGE) 500 MG tablet TAKE 1 TABLET(500 MG) BY MOUTH TWICE DAILY WITH A MEAL  . nebivolol (BYSTOLIC) 2.5 MG tablet Take 1 tablet (2.5 mg total) by mouth daily.  Marland Kitchen Respiratory Therapy Supplies (FLUTTER) DEVI Use as directed  . SYNTHROID 75 MCG tablet Take 1 tablet (75 mcg total) by mouth daily before breakfast.  . telmisartan (MICARDIS) 80 MG tablet Take 1 tablet (80 mg total) by mouth daily.   No facility-administered encounter  medications on file as of 05/08/2019.    Activities of Daily Living In your present state of health, do you have any difficulty performing the following activities: 05/08/2019  Hearing? N  Vision? N  Difficulty concentrating or making decisions? N  Walking or climbing stairs? N  Dressing or bathing? N  Doing errands, shopping? N  Preparing Food and eating ? N  Using the Toilet? N  In the past six months, have you accidently leaked urine? N  Do you have problems with loss of bowel control? N  Managing your Medications? N  Managing your Finances? N  Housekeeping or managing your Housekeeping? N  Some recent data might be hidden    Patient Care Team: Marin Olp, MD as PCP - General (Family Medicine) Stanford Breed, Denice Bors, MD as PCP - Cardiology (Cardiology) Opthamology, [facility] as Consulting Physician (Ophthalmology) Tanda Rockers, MD as Consulting Physician (Pulmonary Disease) Roseanne Kaufman, MD as Consulting Physician (Orthopedic Surgery)    Assessment:   This is a routine wellness examination for [place].  Exercise Activities and Dietary recommendations Current Exercise Habits: Home exercise routine, Type of exercise: Other - see comments(yardwork/ gardening), Time (Minutes): 30, Frequency (Times/Week): 5, Weekly Exercise (Minutes/Week): 150, Intensity: Moderate  Goals    . Goal weight 125 lb.     123-125 lb range.    . Patient Stated     Keep weight steady and try to improve her diet More fruits and vegetables Try to add some fiber        Fall Risk Fall Risk  05/08/2019 09/08/2018 08/11/2017 08/09/2016 04/17/2015  Falls in the past year? 0 0 No No No  Number falls in past yr: 0 0 - - -  Injury with Fall? 0 0 - - -  Follow up Falls evaluation completed;Education provided;Falls prevention discussed - - - -   Is the patient's home free of loose throw rugs in walkways, pet beds, electrical cords, etc?   yes      Grab bars in the bathroom? yes      Handrails on the  stairs?   yes      Adequate lighting?   yes  Timed Get Up and Go performed: completed and within normal timeframe; no gait abnormalities noted   Depression Screen PHQ 2/9 Scores 05/08/2019 04/16/2019 09/08/2018 08/11/2017  PHQ - 2 Score 0 0 0 0     Cognitive Function- no cognitive concerns at this time  MMSE - Mini Mental State Exam 08/11/2017  Not completed: (No Data)     6CIT Screen 05/08/2019  What Year? 0 points  What month? 0 points  What time? 0 points  Count back from 20 0 points  Months in reverse 0 points  Repeat phrase 0 points  Total Score 0    Immunization History  Administered Date(s) Administered  . H1N1 01/02/2008  . Influenza Split 10/14/2010, 10/07/2011  . Influenza, High Dose Seasonal PF 11/05/2016  . Influenza,inj,Quad PF,6+ Mos 10/02/2012, 10/16/2015, 11/07/2017, 10/05/2018  . Influenza-Unspecified 09/25/2013, 10/15/2014, 11/07/2017  . Moderna SARS-COVID-2 Vaccination 02/09/2019, 03/12/2019  . Pneumococcal Conjugate-13 08/14/2013  . Pneumococcal Polysaccharide-23 10/29/2011  . Zoster Recombinat (Shingrix) 06/25/2017, 09/30/2017    Qualifies for Shingles Vaccine? Shingrix completed   Screening Tests Health Maintenance  Topic Date Due  . TETANUS/TDAP  04/06/2019  . INFLUENZA VACCINE  08/26/2019  . COLONOSCOPY  08/11/2023  . DEXA SCAN  Completed  . Hepatitis C Screening  Completed  . PNA vac Low Risk Adult  Completed    Cancer Screenings: Lung: Low Dose CT Chest recommended if Age 62-80 years, 30 pack-year currently smoking OR have quit w/in 15years. Patient does not qualify. Breast:  Up to date on Mammogram? Yes   Up to date of Bone Density/Dexa? Yes Colorectal: colonoscopy 08/11/18    Plan:    I have personally reviewed and addressed the Medicare Annual Wellness questionnaire and have noted the following in the patient's chart:  A. Medical and social history B. Use  of alcohol, tobacco or illicit drugs  C. Current medications and  supplements D. Functional ability and status E.  Nutritional status F.  Physical activity G. Advance directives H. List of other physicians I.  Hospitalizations, surgeries, and ER visits in previous 12 months J.  [place] such as hearing and vision if needed, cognitive and depression L. Referrals, records requested, and appointments- none   In addition, I have reviewed and discussed with patient certain preventive protocols, quality metrics, and best practice recommendations. A written personalized care plan for preventive services as well as general preventive health recommendations were provided to patient.   Signed,  Denman George, LPN  Nurse Health Advisor   Nurse Notes: Patient would like to discuss at upcoming appointment referral to dietician for diabetic education.

## 2019-05-08 NOTE — Patient Instructions (Signed)
Belinda Day , Thank you for taking time to come for your Medicare Wellness Visit. I appreciate your ongoing commitment to your health goals. Please review the following plan we discussed and let me know if I can assist you in the future.   Screening recommendations/referrals: Colorectal Screening: up to date; last colonoscopy 08/11/18 Mammogram: up to date; last 01/03/19 Bone Density: up to date; last 03/12/19  Vision and Dental Exams: Recommended annual ophthalmology exams for early detection of glaucoma and other disorders of the eye Recommended annual dental exams for proper oral hygiene  Vaccinations: Influenza vaccine: completed 10/05/18 Pneumococcal vaccine: up to date; last 08/14/13 Tdap vaccine: recommended every 10 years; Please call your insurance company to determine your out of pocket expense. You also receive this vaccine at your local pharmacy or Health Dept. Shingles vaccine: Shingrix completed  Covid vaccine:  Completed   Advanced directives: Please bring a copy of your POA (Power of Attorney) and/or Living Will to your next appointment.  Goals: Recommend to drink at least 6-8 8oz glasses of water per day and consume a balanced diet rich in fresh fruits and vegetables.   Next appointment: Please schedule your Annual Wellness Visit with your Nurse Health Advisor in one year.  Preventive Care 15 Years and Older, Female Preventive care refers to lifestyle choices and visits with your health care provider that can promote health and wellness. What does preventive care include?  A yearly physical exam. This is also called an annual well check.  Dental exams once or twice a year.  Routine eye exams. Ask your health care provider how often you should have your eyes checked.  Personal lifestyle choices, including:  Daily care of your teeth and gums.  Regular physical activity.  Eating a healthy diet.  Avoiding tobacco and drug use.  Limiting alcohol use.  Practicing  safe sex.  Taking low-dose aspirin every day if recommended by your health care provider.  Taking vitamin and mineral supplements as recommended by your health care provider. What happens during an annual well check? The services and screenings done by your health care provider during your annual well check will depend on your age, overall health, lifestyle risk factors, and family history of disease. Counseling  Your health care provider may ask you questions about your:  Alcohol use.  Tobacco use.  Drug use.  Emotional well-being.  Home and relationship well-being.  Sexual activity.  Eating habits.  History of falls.  Memory and ability to understand (cognition).  Work and work Statistician.  Reproductive health. Screening  You may have the following tests or measurements:  Height, weight, and BMI.  Blood pressure.  Lipid and cholesterol levels. These may be checked every 5 years, or more frequently if you are over 52 years old.  Skin check.  Lung cancer screening. You may have this screening every year starting at age 43 if you have a 30-pack-year history of smoking and currently smoke or have quit within the past 15 years.  Fecal occult blood test (FOBT) of the stool. You may have this test every year starting at age 39.  Flexible sigmoidoscopy or colonoscopy. You may have a sigmoidoscopy every 5 years or a colonoscopy every 10 years starting at age 44.  Hepatitis C blood test.  Hepatitis B blood test.  Sexually transmitted disease (STD) testing.  Diabetes screening. This is done by checking your blood sugar (glucose) after you have not eaten for a while (fasting). You may have this done every 1-3  years.  Bone density scan. This is done to screen for osteoporosis. You may have this done starting at age 37.  Mammogram. This may be done every 1-2 years. Talk to your health care provider about how often you should have regular mammograms. Talk with your  health care provider about your test results, treatment options, and if necessary, the need for more tests. Vaccines  Your health care provider may recommend certain vaccines, such as:  Influenza vaccine. This is recommended every year.  Tetanus, diphtheria, and acellular pertussis (Tdap, Td) vaccine. You may need a Td booster every 10 years.  Zoster vaccine. You may need this after age 51.  Pneumococcal 13-valent conjugate (PCV13) vaccine. One dose is recommended after age 53.  Pneumococcal polysaccharide (PPSV23) vaccine. One dose is recommended after age 27. Talk to your health care provider about which screenings and vaccines you need and how often you need them. This information is not intended to replace advice given to you by your health care provider. Make sure you discuss any questions you have with your health care provider. Document Released: 02/07/2015 Document Revised: 10/01/2015 Document Reviewed: 11/12/2014 Elsevier Interactive Patient Education  2017 [place] Prevention in the Home Falls can cause injuries. They can happen to people of all ages. There are many things you can do to make your home safe and to help prevent falls. What can I do on the outside of my home?  Regularly fix the edges of walkways and driveways and fix any cracks.  Remove anything that might make you trip as you walk through a door, such as a raised step or threshold.  Trim any bushes or trees on the path to your home.  Use bright outdoor lighting.  Clear any walking paths of anything that might make someone trip, such as rocks or tools.  Regularly check to see if handrails are loose or broken. Make sure that both sides of any steps have handrails.  Any raised decks and porches should have guardrails on the edges.  Have any leaves, snow, or ice cleared regularly.  Use sand or salt on walking paths during winter.  Clean up any spills in your garage right away. This includes oil  or grease spills. What can I do in the bathroom?  Use night lights.  Install grab bars by the toilet and in the tub and shower. Do not use towel bars as grab bars.  Use non-skid mats or decals in the tub or shower.  If you need to sit down in the shower, use a plastic, non-slip stool.  Keep the floor dry. Clean up any water that spills on the floor as soon as it happens.  Remove soap buildup in the tub or shower regularly.  Attach bath mats securely with double-sided non-slip rug tape.  Do not have throw rugs and other things on the floor that can make you trip. What can I do in the bedroom?  Use night lights.  Make sure that you have a light by your bed that is easy to reach.  Do not use any sheets or blankets that are too big for your bed. They should not hang down onto the floor.  Have a firm chair that has side arms. You can use this for support while you get dressed.  Do not have throw rugs and other things on the floor that can make you trip. What can I do in the kitchen?  Clean up any spills right away.  Avoid walking on wet floors.  Keep items that you use a lot in easy-to-reach places.  If you need to reach something above you, use a strong step stool that has a grab bar.  Keep electrical cords out of the way.  Do not use floor polish or wax that makes floors slippery. If you must use wax, use non-skid floor wax.  Do not have throw rugs and other things on the floor that can make you trip. What can I do with my stairs?  Do not leave any items on the stairs.  Make sure that there are handrails on both sides of the stairs and use them. Fix handrails that are broken or loose. Make sure that handrails are as long as the stairways.  Check any carpeting to make sure that it is firmly attached to the stairs. Fix any carpet that is loose or worn.  Avoid having throw rugs at the top or bottom of the stairs. If you do have throw rugs, attach them to the floor with  carpet tape.  Make sure that you have a light switch at the top of the stairs and the bottom of the stairs. If you do not have them, ask someone to add them for you. What else can I do to help prevent falls?  Wear shoes that:  Do not have high heels.  Have rubber bottoms.  Are comfortable and fit you well.  Are closed at the toe. Do not wear sandals.  If you use a stepladder:  Make sure that it is fully opened. Do not climb a closed stepladder.  Make sure that both sides of the stepladder are locked into place.  Ask someone to hold it for you, if possible.  Clearly mark and make sure that you can see:  Any grab bars or handrails.  First and last steps.  Where the edge of each step is.  Use tools that help you move around (mobility aids) if they are needed. These include:  Canes.  Walkers.  Scooters.  Crutches.  Turn on the lights when you go into a dark area. Replace any light bulbs as soon as they burn out.  Set up your furniture so you have a clear path. Avoid moving your furniture around.  If any of your floors are uneven, fix them.  If there are any pets around you, be aware of where they are.  Review your medicines with your doctor. Some medicines can make you feel dizzy. This can increase your chance of falling. Ask your doctor what other things that you can do to help prevent falls. This information is not intended to replace advice given to you by your health care provider. Make sure you discuss any questions you have with your health care provider. Document Released: 11/07/2008 Document Revised: 06/19/2015 Document Reviewed: 02/15/2014 Elsevier Interactive Patient Education  2017 [employer].

## 2019-05-10 ENCOUNTER — Telehealth: Payer: Self-pay | Admitting: Internal Medicine

## 2019-05-10 MED ORDER — AZITHROMYCIN 250 MG PO TABS: 250.0000 mg | ORAL_TABLET | Freq: Every day | ORAL | 5 refills | Status: DC

## 2019-05-10 NOTE — Telephone Encounter (Signed)
Assessment & Plan Note by Tanda Rockers, MD at 02/17/2019 9:51 AM Author: Tanda Rockers, MD Author Type: Physician Filed: 02/17/2019 9:52 AM  Note Status: Written Cosign: Cosign Not Required Encounter Date: 02/16/2019  Problem: MAI (mycobacterium avium-intracellulare) [facility])  Editor: Tanda Rockers, MD (Physician)     08/08/07 FOB with classic cobblestoning and MAI on culture.  - Rx 08/2007   To 08/2009 with ETH/Zmax - restarted empirically 05/31/14 > stopped 09/03/14 no benefit perceived, no change on cxr  - restart zmax daily 03/02/2016 > d/c 08/03/2016 > no change in symptoms or freq exac - -zmax daily restarted 05/16/17 and clinically improved 08/02/2017 using prn omnicef to supplment   - flare 09/2018 did not respond to omnicef but did respond to augmentin but neither effective against MAI or resistant gnr's so no change in rx needed   02/16/2019 back to baseline clinically and radiographically      Called and spoke with pt. Verified preferred pharmacy and sent rx to pharmacy for pt. Nothing further needed.

## 2019-05-11 ENCOUNTER — Other Ambulatory Visit: Payer: Self-pay | Admitting: Internal Medicine

## 2019-05-11 MED ORDER — AZITHROMYCIN 250 MG PO TABS: 250.0000 mg | ORAL_TABLET | Freq: Every day | ORAL | 5 refills | Status: DC

## 2019-05-15 ENCOUNTER — Other Ambulatory Visit: Payer: Self-pay

## 2019-05-15 ENCOUNTER — Encounter: Payer: Self-pay | Admitting: Family Medicine

## 2019-05-15 ENCOUNTER — Ambulatory Visit (INDEPENDENT_AMBULATORY_CARE_PROVIDER_SITE_OTHER): Payer: Medicare Other | Admitting: Family Medicine

## 2019-05-15 VITALS — BP 118/62 | HR 77 | Temp 98.1°F | Ht 61.0 in | Wt 120.6 lb

## 2019-05-15 DIAGNOSIS — J479 Bronchiectasis, uncomplicated: Secondary | ICD-10-CM

## 2019-05-15 DIAGNOSIS — A310 Pulmonary mycobacterial infection: Secondary | ICD-10-CM | POA: Diagnosis not present

## 2019-05-15 DIAGNOSIS — S60311A Abrasion of right thumb, initial encounter: Secondary | ICD-10-CM

## 2019-05-15 DIAGNOSIS — I10 Essential (primary) hypertension: Secondary | ICD-10-CM

## 2019-05-15 DIAGNOSIS — Z0000 Encounter for general adult medical examination without abnormal findings: Secondary | ICD-10-CM

## 2019-05-15 DIAGNOSIS — I2510 Atherosclerotic heart disease of native coronary artery without angina pectoris: Secondary | ICD-10-CM | POA: Diagnosis not present

## 2019-05-15 DIAGNOSIS — D692 Other nonthrombocytopenic purpura: Secondary | ICD-10-CM | POA: Insufficient documentation

## 2019-05-15 DIAGNOSIS — E039 Hypothyroidism, unspecified: Secondary | ICD-10-CM

## 2019-05-15 DIAGNOSIS — E8881 Metabolic syndrome: Secondary | ICD-10-CM

## 2019-05-15 DIAGNOSIS — M797 Fibromyalgia: Secondary | ICD-10-CM

## 2019-05-15 DIAGNOSIS — E785 Hyperlipidemia, unspecified: Secondary | ICD-10-CM | POA: Diagnosis not present

## 2019-05-15 DIAGNOSIS — I2583 Coronary atherosclerosis due to lipid rich plaque: Secondary | ICD-10-CM

## 2019-05-15 DIAGNOSIS — R739 Hyperglycemia, unspecified: Secondary | ICD-10-CM

## 2019-05-15 DIAGNOSIS — Z23 Encounter for immunization: Secondary | ICD-10-CM | POA: Diagnosis not present

## 2019-05-15 MED ORDER — ALPRAZOLAM 0.25 MG PO TABS: ORAL_TABLET | ORAL | 1 refills | Status: DC

## 2019-05-15 NOTE — Addendum Note (Signed)
Addended by: Francella Solian on: 05/15/2019 02:54 PM   Modules accepted: Orders

## 2019-05-15 NOTE — Patient Instructions (Addendum)
Td only today. No Tdap. Under abrasion right thumb   We will call you within two weeks about your referral to nutritionist. If you do not hear within 3 weeks, give [place] a call.    Recommended follow up: Return in about 6 months (around 11/14/2019) for follow up- or sooner if needed.  Schedule labs a few days before your next visit

## 2019-05-15 NOTE — Progress Notes (Signed)
Phone: [phone]   Subjective:  Patient presents today for their annual physical. Chief complaint-noted.   See problem oriented charting- Review of Systems  Constitutional: Negative for chills and fever.  HENT: Negative for ear pain, hearing loss and tinnitus.   Eyes: Negative for blurred vision, double vision and photophobia.  Respiratory: Positive for cough. Negative for shortness of breath and wheezing.        Chronic doing well with treatment   Cardiovascular: Negative for chest pain, palpitations and leg swelling.  Gastrointestinal: Positive for heartburn. Negative for constipation, diarrhea, nausea and vomiting.       OTC medications help on average 3 times a week after dinner.   Genitourinary: Negative for dysuria, frequency and urgency.  Musculoskeletal: Negative for back pain, joint pain and neck pain.  Skin: Negative for itching and rash.  Neurological: Negative for dizziness, seizures, weakness and headaches.  Endo/Heme/Allergies: Does not bruise/bleed easily.  Psychiatric/Behavioral: Negative for depression, hallucinations, memory loss and suicidal ideas. The patient does not have insomnia.    The following were reviewed and entered/updated in epic: Past Medical History:  Diagnosis Date  . Bronchiectasis    oxygen at night in the past  . CAD (coronary artery disease)   . Diverticulitis 2014  . GERD (gastroesophageal reflux disease)   . History of shingles 04/2012  . HTN (hypertension)   . Hyperlipidemia   . Hypothyroidism   . MAI (mycobacterium avium-intracellulare) ([place])   . Neuritis of upper extremity    Patient Active Problem List   Diagnosis Date Noted  . Osteopenia 08/14/2011    Priority: High  . CAD (coronary artery disease) s/p CABG 07/03/2007    Priority: High  . Insulin resistance 09/07/2017    Priority: Medium  . Hyperglycemia 08/05/2014    Priority: Medium  . Insomnia 02/12/2013    Priority: Medium  . Nocturnal hypoxemia 12/12/2012   Priority: Medium  . Fibromyalgia 12/28/2011    Priority: Medium  . Hypothyroidism 04/06/2010    Priority: Medium  . MAI (mycobacterium avium-intracellulare) ([place]) 11/19/2009    Priority: Medium  . Hyperlipemia 07/03/2007    Priority: Medium  . Essential hypertension 07/03/2007    Priority: Medium  . Obstructive bronchiectasis ([place]) with GOLD II/III criteria 07/03/2007    Priority: Medium  . Former smoker 08/05/2014    Priority: Low  . Constipation 05/02/2014    Priority: Low  . History of colonic polyps 05/02/2014    Priority: Low  . GERD (gastroesophageal reflux disease) 02/24/2014    Priority: Low  . Hemoptysis 02/24/2014    Priority: Low  . Benign paroxysmal positional vertigo 04/18/2013    Priority: Low  . Diverticulitis 12/21/2012    Priority: Low  . Senile purpura ([place]) 05/15/2019  . Cystitis 11/05/2016  . Cerebrovascular disease 01/31/2015   Past Surgical History:  Procedure Laterality Date  . CORONARY ARTERY BYPASS GRAFT  2004   x3 CABG    Family History  Problem Relation Age of Onset  . Colon cancer Mother   . Hyperlipidemia Mother   . Hypertension Mother   . Heart disease Mother   . Stroke Mother   . Diabetes Mother   . Colon cancer Father   . Arthritis Father   . Hyperlipidemia Father   . Hypertension Father   . Heart disease Father   . Stroke Father   . Atopy Neg Hx     Medications- reviewed and updated Current Outpatient Medications  Medication Sig Dispense Refill  . acetaminophen (TYLENOL) 650  MG CR tablet every 8 (eight) hours as needed for pain.     Marland Kitchen albuterol (PROAIR HFA) 108 (90 Base) MCG/ACT inhaler Inhale 2 puffs into the lungs every 6 (six) hours as needed for wheezing or shortness of breath. 1 Inhaler 1  . ALPRAZolam (XANAX) 0.25 MG tablet TAKE 1 TABLET BY MOUTH AT BEDTIME AS NEEDED FOR SLEEP 90 tablet 1  . amitriptyline (ELAVIL) 50 MG tablet take 1 tablet by mouth at bedtime 90 tablet 3  . aspirin EC 81 MG tablet Take 81 mg by mouth  every other day.    Marland Kitchen atorvastatin (LIPITOR) 80 MG tablet Take 1 tablet (80 mg total) by mouth daily. 90 tablet 0  . azithromycin (ZITHROMAX) 250 MG tablet Take 1 tablet (250 mg total) by mouth daily. 30 tablet 5  . budesonide-formoterol (SYMBICORT) 160-4.5 MCG/ACT inhaler INHALE 2 PUFFS FIRST THING IN THE MORNING AND THEN ANOTHER 2 PUFFS ABOUT 12 HOURS LATER 10.2 g 5  . Dextromethorphan-Guaifenesin (MUCINEX DM) 30-600 MG TB12 Take 2 tablets by mouth 2 (two) times daily. Patient takes 1200mg     . Melatonin 5 MG TABS Take 5 mg by mouth at bedtime.    . metFORMIN (GLUCOPHAGE) 500 MG tablet TAKE 1 TABLET(500 MG) BY MOUTH TWICE DAILY WITH A MEAL 180 tablet 3  . nebivolol (BYSTOLIC) 2.5 MG tablet Take 1 tablet (2.5 mg total) by mouth daily. 90 tablet 0  . Respiratory Therapy Supplies (FLUTTER) DEVI Use as directed 1 each 0  . SYNTHROID 75 MCG tablet Take 1 tablet (75 mcg total) by mouth daily before breakfast. 90 tablet 3  . telmisartan (MICARDIS) 80 MG tablet Take 1 tablet (80 mg total) by mouth daily. 90 tablet 3   No current facility-administered medications for this visit.    Allergies-reviewed and updated Allergies  Allergen Reactions  . Bactrim [Sulfamethoxazole-Trimethoprim] Hives, Itching and Other (See Comments)    Bruised like areas on body  . Ciprofloxacin Other (See Comments)    Body aches  . Codeine Nausea Only    REACTION: nausea  . Erythromycin Nausea Only    REACTION: nausea  . Levofloxacin Other (See Comments)    REACTION: aches    Social History   Social History Narrative   Family: Single never married, no children, cat and rehabs turtles and tortoise   Went to [employer] in [employer] and social work, some business courses at [employer], EMT for 6 years.    LIves alone. Completely independent.    Lives in retirement community.       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 Oropharynx normal Neck: no thyromegaly CV: RRR no murmurs rubs or gallops Lungs: Diffuse wheezes heard. Abdomen: soft/nontender/nondistended/normal bowel sounds. No rebound or guarding.  Ext: no edema Skin: warm, dry Neuro: grossly normal, moves all extremities, PERRLA   Assessment and Plan   74 y.o. female presenting for annual physical.  Health Maintenance counseling: 1. Anticipatory guidance: Patient counseled regarding regular dental exams q6 months, eye exams yearly,  avoiding smoking and second hand smoke yes , limiting alcohol to 1 beverage  per day- Never drinks   2. Risk factor reduction:  Advised patient of need for regular exercise and diet rich and fruits and vegetables to reduce risk of heart attack and stroke. Exercise-  Works in yard daily. Diet- limits sugar. Tries to have healthy diet. Weight stable for me that tetanus shot today Wt Readings from Last 3 Encounters:  05/15/19 120 lb 9.6 oz (54.7 kg)  05/08/19 121 lb 6.4 oz (55.1 kg)  04/16/19 121 lb (54.9 kg)  3. Immunizations/screenings/ancillary studies- fully up to date other than due for TD- she had a wire that caused an abrasion on her right thumb. We will give Td today Immunization History  Administered Date(s) Administered  . H1N1 01/02/2008  . Influenza Split 10/14/2010, 10/07/2011  . Influenza, High Dose Seasonal PF 11/05/2016  . Influenza,inj,Quad PF,6+ Mos 10/02/2012, 10/16/2015, 11/07/2017, 10/05/2018  . Influenza-Unspecified 09/25/2013, 10/15/2014, 11/07/2017  . Moderna SARS-COVID-2 Vaccination 02/09/2019, 03/12/2019  . Pneumococcal Conjugate-13 08/14/2013  . Pneumococcal Polysaccharide-23 10/29/2011  . Zoster Recombinat (Shingrix) 06/25/2017, 09/30/2017  4.  Cervical cancer screening- denies ever having abnormal Pap smear.  Past age based screening recommendations 5. Breast cancer screening-   mammogram yearly next due 01/03/2020 through [facility] 6. Colon cancer screening -  last done 08/11/2018 next due 2023 with [facility]- originally saw [facility].  7. Skin cancer screening- followed by Dermatology yearly. advised regular sunscreen use. Denies worrisome, changing, or new skin lesions.  8. Birth control/STD check- not sexually active 9. Osteoporosis screening at 89-  last done 03/12/2019 with [facility].  Bone density seems to have actually improved slightly-continue calcium/ vitamin D and weightbearing activity -former smoker quit over 30 years ago. Smoked 1ppd for 25 years.  No regular screening required  Status of chronic or acute concerns   #Fibromyalgia/insomnia S: Compliant with amitriptyline 50 mg-due to age, her pharmacist has mentioned coming off amitriptyline but she has been on since 1988 and prefers to continue.  Uses Tylenol as needed. -Uses Xanax to help with sleep.  Very difficult to fall asleep if she does not take this.  Fibromyalgia symptoms worsen with poor sleep. No falls or injuries A/P: Fibromyalgia reasonably well-controlled.  Ideally we would reduce the dose but when this has been attempted in the past she has had worsening pain.  For sleep she is doing well with alprazolam/Xanax-we discussed if she ever has falls in the future we would need to stop this medication first.  % Pulmonary-MAI and obstructive bronchiectasis-follows with Dr. Melvyn Novas S: Patient follows with pulmonary clinic.  On regular Symbicort.  Albuterol as needed.  Uses azithromycin daily. Ongoing cough issues A/P: Stable. Continue current medications.    % #CAD status post CABG-follows with Dr. Jones Broom S: Compliant with atorvastatin 80 mg and aspirin 81 mg.  LDL goal under 70 A/P: no chest pain. Sob related to pulmonary issues- doubt cardiac. LDL at goal  under 70. Continue current meds    #Hypertension S: Compliant with Bystolic 2.5 mg, telmisartan 80mg  A/P: excellent control- had recent increase by cardiology   #Hypothyroidism S: Compliant with Synthroid 75 mcg Lab Results  Component Value Date   TSH 1.16 05/08/2019  A/P: Stable. Continue current medications.     #Hyperglycemia/prediabetes S: Compliant with metformin 500 mg twice a day. Lab Results  Component Value Date   HGBA1C 6.1 05/08/2019  A/P: reasonable control- We have also referred patient to diabetes educator per her request    #Osteopenia-technically osteoporosis since has been on Fosamax in the past S: Takes calcium  and vitamin D alone and all areas were stable-"02/16/17 DEXA. -2.2 at R femur neck-23% 10 year risk and hip fracture risk 12%.".  Numbers improved by 0.1 on femur necks in 2021 A/P: Osteopenia has improved-we will repeat in 2 to 3 years.   #Constipation- recently doing ok on milk of magnesium. In past Patient compliant with sparing Linzess- was costly     #senile purpura- easy bruising on aspirin   Recommended follow up: Return in about 6 months (around 11/14/2019) for follow up- or sooner if needed. Future Appointments  Date Time Provider [place]  08/16/2019  9:00 AM Tanda Rockers, MD [facility] None   Lab/Order associations: already had fasting labs   ICD-10-CM   1. Preventative health care  Z00.00   2. Hyperlipidemia, unspecified hyperlipidemia type  E78.5 LDL cholesterol, direct  3. Essential hypertension  I10 Comprehensive metabolic panel  4. Coronary artery disease due to lipid rich plaque  I25.10 LDL cholesterol, direct   I25.83   5. MAI (mycobacterium avium-intracellulare) (HCC)  A31.0   6. Obstructive bronchiectasis (H[facility]) with GOLD II/III criteria  J47.9   7. Insulin resistance  E88.81 Amb ref to Medical Nutrition Therapy-MNT  8. Hyperglycemia  R73.9 Amb ref to Medical Nutrition Therapy-MNT    Hemoglobin A1c  9. Fibromyalgia   M79.7   10. Abrasion of right thumb, initial encounter  S60.311A   11. Senile purpura (HCC)  D69.2   12. Hypothyroidism, unspecified type  E03.9 TSH    Meds ordered this encounter  Medications  . ALPRAZolam (XANAX) 0.25 MG tablet    Sig: TAKE 1 TABLET BY MOUTH AT BEDTIME AS NEEDED FOR SLEEP    Dispense:  90 tablet    Refill:  1    Return precautions advised.  Garret Reddish, MD

## 2019-05-17 ENCOUNTER — Other Ambulatory Visit: Payer: Self-pay | Admitting: Cardiology

## 2019-05-17 MED ORDER — ATORVASTATIN CALCIUM 80 MG PO TABS: 80.0000 mg | ORAL_TABLET | Freq: Every day | ORAL | 0 refills | Status: DC

## 2019-05-23 DIAGNOSIS — L570 Actinic keratosis: Secondary | ICD-10-CM | POA: Diagnosis not present

## 2019-05-23 DIAGNOSIS — L718 Other rosacea: Secondary | ICD-10-CM | POA: Diagnosis not present

## 2019-05-23 DIAGNOSIS — Z85828 Personal history of other malignant neoplasm of skin: Secondary | ICD-10-CM | POA: Diagnosis not present

## 2019-05-25 MED ORDER — NEBIVOLOL HCL 2.5 MG PO TABS: 2.5000 mg | ORAL_TABLET | Freq: Every day | ORAL | 3 refills | Status: DC

## 2019-06-22 ENCOUNTER — Other Ambulatory Visit: Payer: Self-pay | Admitting: Family Medicine

## 2019-08-11 ENCOUNTER — Other Ambulatory Visit: Payer: Self-pay | Admitting: Internal Medicine

## 2019-08-11 ENCOUNTER — Other Ambulatory Visit: Payer: Self-pay | Admitting: Cardiology

## 2019-08-16 ENCOUNTER — Ambulatory Visit: Payer: Medicare Other | Admitting: Internal Medicine

## 2019-08-27 ENCOUNTER — Ambulatory Visit: Payer: Medicare Other | Admitting: Internal Medicine

## 2019-08-27 ENCOUNTER — Encounter: Payer: Self-pay | Admitting: Internal Medicine

## 2019-08-27 ENCOUNTER — Other Ambulatory Visit: Payer: Self-pay

## 2019-08-27 DIAGNOSIS — J479 Bronchiectasis, uncomplicated: Secondary | ICD-10-CM | POA: Diagnosis not present

## 2019-08-27 NOTE — Assessment & Plan Note (Signed)
Onset of symptoms around 2002     - PFT's 10/14/2010  FEV1  1.25 (68%) and ratio 58% and DLCO 90%     - PFT's 10/29/2011  FEV1  1.33 (74%) and ratio 57 % and DLCO 93%    - PFTs 08/14/2013   FEV1  1.16 (60%) and ratio 61 with dlco 83%     - Flutter valve added 09/03/14      - alpha one   01/14/2015 >  MM, level 147     - IgE  01/14/15  11 - CT chest 04/17/15 Marked chronic bronchiectasis and volume loss in the right middle lobe with milder bronchiectasis, bronchial wall thickening, and nodular densities throughout the right upper and right lower lobe suggestive of chr onic endobronchial/atypical mycobacterial infection. - 10/16/2015 changed to symbicort 80 2bid (? Higher doses contributing to w MAI /freq of infections)   - 03/02/2016  After extensive coaching HFA effectiveness =    90%  - 05/04/2016 VEST stared around May 25 2016 - try off zmax 08/03/2016 > no change clinically as of 11/05/2016  - 02/14/2017 cycles of omnicef x 7 days prn purulent sputum  PFT's  05/16/2017  FEV1 0.89 (47 % ) ratio 60  p 12 % improvement from saba p nothing prior to study   - 05/16/2017  After extensive coaching inhaler device  effectiveness =    90%  - 05/16/17 quant Ig's ok x M slt low (not acutely ill at the time)  -zmax daily restarted 05/16/17 and clinically improved 08/02/2017 using prn omnicef to supplment but did not continue zmax as rec  - 11/14/2017  After extensive coaching inhaler device,  effectiveness =    90% baseline  - restart zmax daily 11/14/2017 > improved 02/14/2018   Well compensated with main issue being affordability  Since she has no noct symptoms can try weaning pm dose   Will refer to Pharmacy to see if can find cheaper alternative to symb 160    F/u can be q 6 m, sooner prn          Each maintenance medication was reviewed in detail including emphasizing most importantly the difference between maintenance and prns and under what circumstances the prns are to be triggered using an action  plan format where appropriate.  Total time for H and P, chart review, counseling, reviewing device options and generating customized AVS unique to this office visit / charting = 20 min

## 2019-08-27 NOTE — Progress Notes (Signed)
Subjective:    Patient ID: Belinda Day, female   DOB: 1945-11-02    MRN: [id number]   Brief patient profile:  28  yowf MM/ quit smoking 1972 with documented right middle lobe syndrome and evidence of bronchiectasis by CT scan in March 2002  And GOLD II criteria for copd 09/2010     History of Present Illness  08/08/07 FOB with classic cobblestoning and MAI on culture.   08/15/07 given Levaquin x 10 days with resolution bloody mucus, but "felt she had flu the whole time" with aches, feverish   August 29, 2007 ov: first post bronch co still coughing up mucus clear and initiate rx with symbicort/ [place]   October 19, 2007 ov no cough , sob, feeling great but no improvement on cxr   December 07, 2007 ov feeling great, minimal am cough not productive. No sob.   Opth eval, labs ok 10/2007   September 06, 2008 ov overall better over the last year, less tendency to exac on zmax and ethambutol. rec complete another year > satisfied improved 90% and stopped zmax and eth 08/2009     05/16/17 rec zmax daily but did not do  11/14/2017  f/u ov/Belinda Day re: obst bronchiectasis/ confused with details of care / taking zpak prn but also has omnicef and not sure what to do with it  Chief Complaint  Patient presents with  . Follow-up    no current problems   Dyspnea:  yardwork ok / MMRC1 = can walk nl pace, flat grade, can't hurry or go uphills or steps s sob   Cough: variable esp in am / using flutter and vest prn / mucus beige and about a tbsp in am s heme Sleeping:  Bed flat and one big pilow  SABA use: rarely needed  rec Plan A = Automatic = symbicort 160  And take zmax daily  Plan B = Backup for  breathing Only use your albuterol as a rescue medication Plan B = for mucus if Nastier than nl especially assoc with any fever >  omnicef 300 mg twice daily x 7days Plan C = Call me if needed and A and B aren't working well   cxr on return    08/27/2019  f/u ov/Belinda Day re: bronchiectasis  obstructive  maint on symbicort 160 2bid and zmax daily  Chief Complaint  Patient presents with  . Follow-up    sob on exertion and productive cough with yellow sputum  Dyspnea: yardwork  Cough: some worse in am/ no blood on zmax one daily minimally dicolored  Sleeping: on flat bed with big pillow  SABA use: not using  02: none    No obvious day to day or daytime variability or assoc excess/ purulent sputum or mucus plugs or hemoptysis or cp or chest tightness, subjective wheeze or overt sinus or hb symptoms.   Sleeping  without nocturnal exacerbation  of respiratory  c/o's or need for noct saba. Also denies any obvious fluctuation of symptoms with weather or environmental changes or other aggravating or alleviating factors except as outlined above   No unusual exposure hx or h/o childhood pna/ asthma or knowledge of premature birth.  Current Allergies, Complete Past Medical History, Past Surgical History, Family History, and Social History were reviewed in [employer] record.  ROS  The following are not active complaints unless bolded Hoarseness, sore throat, dysphagia, dental problems, itching, sneezing,  nasal congestion or discharge of excess mucus or  purulent secretions, ear ache,   fever, chills, sweats, unintended wt loss or wt gain, classically pleuritic or exertional cp,  orthopnea pnd or arm/hand swelling  or leg swelling, presyncope, palpitations, abdominal pain, anorexia, nausea, vomiting, diarrhea  or change in bowel habits or change in bladder habits, change in stools or change in urine, dysuria, hematuria,  rash, arthralgias, visual complaints, headache, numbness, weakness or ataxia or problems with walking or coordination,  change in mood or  memory.        Current Meds  Medication Sig  . acetaminophen (TYLENOL) 650 MG CR tablet every 8 (eight) hours as needed for pain.   Marland Kitchen albuterol (PROAIR HFA) 108 (90 Base) MCG/ACT inhaler Inhale 2 puffs into the  lungs every 6 (six) hours as needed for wheezing or shortness of breath.  . ALPRAZolam (XANAX) 0.25 MG tablet TAKE 1 TABLET BY MOUTH AT BEDTIME AS NEEDED FOR SLEEP  . amitriptyline (ELAVIL) 50 MG tablet take 1 tablet by mouth at bedtime  . aspirin EC 81 MG tablet Take 81 mg by mouth every other day.  Marland Kitchen atorvastatin (LIPITOR) 80 MG tablet TAKE 1 TABLET(80 MG) BY MOUTH DAILY  . azithromycin (ZITHROMAX) 250 MG tablet Take 1 tablet (250 mg total) by mouth daily.  . budesonide-formoterol (SYMBICORT) 160-4.5 MCG/ACT inhaler INHALE 2 PUFFS BY MOUTH FIRST THING IN THE MORNING AND THEN ANOTHER 2 PUFFS ABOUT 12 HOURS LATER  . Dextromethorphan-Guaifenesin (MUCINEX DM) 30-600 MG TB12 Take 1 tablet by mouth daily. Patient takes 1200mg   . Melatonin 5 MG TABS Take 5 mg by mouth at bedtime.  . metFORMIN (GLUCOPHAGE) 500 MG tablet TAKE 1 TABLET(500 MG) BY MOUTH TWICE DAILY WITH A MEAL  . nebivolol (BYSTOLIC) 2.5 MG tablet Take 1 tablet (2.5 mg total) by mouth daily.  Marland Kitchen Respiratory Therapy Supplies (FLUTTER) DEVI Use as directed  . SYNTHROID 75 MCG tablet Take 1 tablet (75 mcg total) by mouth daily before breakfast.  . telmisartan (MICARDIS) 80 MG tablet Take 1 tablet (80 mg total) by mouth daily.                 Past Medical History:  Bronchiectasis see CT SE 04/13/00  - HFA 75% November 19, 2009  - alpha one screen 01/14/2015 >  MM  - IgE 01/14/2015 = 11  MAI  - Rx Zmax and ETH 08/29/07 > 08/2009 restarted empirically 05/31/14 > 09/03/14 (no change in cough so just use zpak for flares)  - Rx zmax maint  10/16/2015 >>> d/c 08/03/2016 > restarted 05/16/17 and clinically improved 08/02/2017 using prn omnicef to supplment  - Eye eval   10/09.......................Marland KitchenHope so try cycles of cipro April 02, 2010  HEALTH MAINTENANCE...........................[name] - Td 10/2007  - Pneumovax 2005   and 10/29/2011 age 74, prevnar 08/16/2013  CAD  Hyperlipidemia  Hypertension  History of cough with  ACE inhibition.  Gastroesophageal reflux disease         Objective:   Physical Exam    08/27/2019     119 02/16/2019  124 08/15/2018  123  Wt 130 October 19, 2007>140 March 31, 2010 > 127 07/16/2010 > 10/14/2010  120 > 05/04/2011  117 > 07/30/2011  120 > 10/29/2011 118 > 130  05/01/2012 > 12/12/2012 132 >  08/14/13 137 >    02/20/2014  137 >  05/31/2014 134 > 09/03/2014    140 > 10/15/2014 137 > 01/14/2015 137 >  05/15/2015 123 >08/14/2015  124 >  10/16/2015 126 >  03/02/2016   124  > 05/04/2016  126 > 08/03/2016   130 > 02/14/2017  126 > 05/16/2017 128 > 08/02/2017  119 > 11/14/2017  116     Vital signs reviewed  08/27/2019  - Note at rest 02 sats  97% on RA    Very pleasant amb wf nad   HEENT : pt wearing mask not removed for exam due to covid - 19 concerns.    NECK :  without JVD/Nodes/TM/ nl carotid upstrokes bilaterally   LUNGS: no acc muscle use,  Mild barrel  contour chest wall with bilateral  Distant  Wheezes worse on FVC and  without cough on insp or exp maneuvers  and mild  Hyperresonant  to  percussion bilaterally     CV:  RRR  no s3 or murmur or increase in P2, and no edema   ABD:  soft and nontender with pos end  insp Hoover's  in the supine position. No bruits or organomegaly appreciated, bowel sounds nl  MS:   Nl gait/  ext warm without deformities, calf tenderness, cyanosis or clubbing No obvious joint restrictions   SKIN: warm and dry without lesions    NEURO:  alert, approp, nl sensorium with  no motor or cerebellar deficits apparent.           Assessment:

## 2019-08-27 NOTE — Patient Instructions (Signed)
I will check to see what the cheapest alternative is for Belinda Day to taper the pm dose symbicort off    Please schedule a follow up visit in 6  months but call sooner if needed

## 2019-08-28 ENCOUNTER — Telehealth: Payer: Self-pay | Admitting: Pharmacy Technician

## 2019-08-28 MED ORDER — MOMETASONE FURO-FORMOTEROL FUM 200-5 MCG/ACT IN AERO
2.0000 | INHALATION_SPRAY | Freq: Two times a day (BID) | RESPIRATORY_TRACT | 6 refills | Status: DC
Start: 2019-08-28 — End: 2020-05-19

## 2019-08-28 NOTE — Telephone Encounter (Signed)
-----   Message from Deboraha Sprang, [place] sent at 08/28/2019  8:36 AM EDT -----  ----- Message ----- From: Tanda Rockers, MD Sent: 08/27/2019   5:05 PM EDT To: Deboraha Sprang, RPH-CPP  She is paying 111 per month for symbicort 160 and was told her insurance woudn't pay for the generic form   Other option is duler 200 or advair 115  - could you find her the cheapest option ?   Thx

## 2019-08-28 NOTE — Telephone Encounter (Signed)
Dr. Melvyn Novas please advise on Pharmacy team's benefits investigation. Thank you

## 2019-08-28 NOTE — Telephone Encounter (Signed)
Let her know researched by pharmacy and best bet is dulera 200 Take 2 puffs first thing in am and then another 2 puffs about 12 hours later.    she should try this  when her symbicort 160 runs out

## 2019-08-28 NOTE — Telephone Encounter (Signed)
Will look into patient's formulary options.

## 2019-08-28 NOTE — Telephone Encounter (Signed)
Patient contacted with pricing on Dulera 200, she agrees with plan to switch to [facility] after Symbicort is completed. Prescriptions sent to preferred pharmacy.

## 2019-08-28 NOTE — Telephone Encounter (Signed)
Ran test claim for 1 month supply.  Dulera 200- copay is $83.38  Advair 115- copay $108.58  Patient has Medicare is now in the coverage gap and it will affect all copays. Patient should apply for patient assistance for chosen inhaler.

## 2019-10-11 DIAGNOSIS — E119 Type 2 diabetes mellitus without complications: Secondary | ICD-10-CM | POA: Diagnosis not present

## 2019-10-11 DIAGNOSIS — H52203 Unspecified astigmatism, bilateral: Secondary | ICD-10-CM | POA: Diagnosis not present

## 2019-10-11 DIAGNOSIS — H5203 Hypermetropia, bilateral: Secondary | ICD-10-CM | POA: Diagnosis not present

## 2019-10-11 DIAGNOSIS — H2513 Age-related nuclear cataract, bilateral: Secondary | ICD-10-CM | POA: Diagnosis not present

## 2019-10-11 LAB — HM DIABETES EYE EXAM

## 2019-10-12 DIAGNOSIS — L309 Dermatitis, unspecified: Secondary | ICD-10-CM | POA: Diagnosis not present

## 2019-10-28 ENCOUNTER — Other Ambulatory Visit: Payer: Self-pay | Admitting: Cardiology

## 2019-10-30 ENCOUNTER — Other Ambulatory Visit: Payer: Self-pay

## 2019-10-30 ENCOUNTER — Ambulatory Visit (INDEPENDENT_AMBULATORY_CARE_PROVIDER_SITE_OTHER): Payer: Medicare Other

## 2019-10-30 ENCOUNTER — Encounter: Payer: Self-pay | Admitting: Family Medicine

## 2019-10-30 DIAGNOSIS — Z23 Encounter for immunization: Secondary | ICD-10-CM

## 2019-11-05 ENCOUNTER — Other Ambulatory Visit: Payer: Self-pay | Admitting: Internal Medicine

## 2019-11-07 NOTE — Telephone Encounter (Signed)
Dr. Melvyn Novas, please advise on refill.

## 2019-11-16 ENCOUNTER — Ambulatory Visit: Payer: Medicare Other | Admitting: Family Medicine

## 2019-11-23 NOTE — Progress Notes (Signed)
Phone [phone] In person visit   Subjective:   Belinda Day is a 73 y.o. year old very pleasant female patient who presents for/with See problem oriented charting Chief Complaint  Patient presents with  . Hyperlipidemia  . Hypertension   This visit occurred during the SARS-CoV-2 public health emergency.  Safety protocols were in place, including screening questions prior to the visit, additional usage of staff PPE, and extensive cleaning of exam room while observing appropriate contact time as indicated for disinfecting solutions.   Past Medical History-  Patient Active Problem List   Diagnosis Date Noted  . Osteopenia 08/14/2011    Priority: High  . CAD (coronary artery disease) s/p CABG 07/03/2007    Priority: High  . Insulin resistance 09/07/2017    Priority: Medium  . Hyperglycemia 08/05/2014    Priority: Medium  . Insomnia 02/12/2013    Priority: Medium  . Nocturnal hypoxemia 12/12/2012    Priority: Medium  . Fibromyalgia 12/28/2011    Priority: Medium  . Hypothyroidism 04/06/2010    Priority: Medium  . MAI (mycobacterium avium-intracellulare) ([place]) 11/19/2009    Priority: Medium  . Hyperlipemia 07/03/2007    Priority: Medium  . Essential hypertension 07/03/2007    Priority: Medium  . Obstructive bronchiectasis ([place]) with GOLD II/III criteria 07/03/2007    Priority: Medium  . Senile purpura ([place]) 05/15/2019    Priority: Low  . Cystitis 11/05/2016    Priority: Low  . Former smoker 08/05/2014    Priority: Low  . Constipation 05/02/2014    Priority: Low  . History of colonic polyps 05/02/2014    Priority: Low  . GERD (gastroesophageal reflux disease) 02/24/2014    Priority: Low  . Hemoptysis 02/24/2014    Priority: Low  . Benign paroxysmal positional vertigo 04/18/2013    Priority: Low  . Diverticulitis 12/21/2012    Priority: Low  . Cerebrovascular disease 01/31/2015    Medications- reviewed and updated Current Outpatient Medications    Medication Sig Dispense Refill  . acetaminophen (TYLENOL) 650 MG CR tablet every 8 (eight) hours as needed for pain.     Marland Kitchen albuterol (PROAIR HFA) 108 (90 Base) MCG/ACT inhaler Inhale 2 puffs into the lungs every 6 (six) hours as needed for wheezing or shortness of breath. 1 Inhaler 1  . ALPRAZolam (XANAX) 0.25 MG tablet TAKE 1 TABLET BY MOUTH AT BEDTIME AS NEEDED FOR SLEEP 90 tablet 1  . amitriptyline (ELAVIL) 50 MG tablet take 1 tablet by mouth at bedtime 90 tablet 3  . aspirin EC 81 MG tablet Take 81 mg by mouth every other day.    Marland Kitchen atorvastatin (LIPITOR) 80 MG tablet TAKE 1 TABLET(80 MG) BY MOUTH DAILY 90 tablet 3  . azithromycin (ZITHROMAX) 250 MG tablet TAKE 1 TABLET(250 MG) BY MOUTH DAILY 30 tablet 5  . Dextromethorphan-Guaifenesin (MUCINEX DM) 30-600 MG TB12 Take 1 tablet by mouth daily. Patient takes 1200mg     . Melatonin 5 MG TABS Take 5 mg by mouth at bedtime.    . metFORMIN (GLUCOPHAGE) 500 MG tablet TAKE 1 TABLET(500 MG) BY MOUTH TWICE DAILY WITH A MEAL 180 tablet 3  . mometasone-formoterol (DULERA) 200-5 MCG/ACT AERO Inhale 2 puffs into the lungs in the morning and at bedtime. 13 g 6  . nebivolol (BYSTOLIC) 2.5 MG tablet Take 1 tablet (2.5 mg total) by mouth daily. 90 tablet 3  . Respiratory Therapy Supplies (FLUTTER) DEVI Use as directed 1 each 0  . SYNTHROID 75 MCG tablet Take 1 tablet (75  mcg total) by mouth daily before breakfast. 90 tablet 3  . telmisartan (MICARDIS) 80 MG tablet Take 1 tablet (80 mg total) by mouth daily. 90 tablet 3   No current facility-administered medications for this visit.     Objective:  BP 118/76   Pulse (!) 56   Temp (!) 97.2 F (36.2 C) (Temporal)   Resp 18   Ht 5\' 1"  (1.549 m)   Wt 120 lb 6.4 oz (54.6 kg)   SpO2 98%   BMI 22.75 kg/m  Gen: NAD, resting comfortably CV: RRR no murmurs rubs or gallops Lungs: CTAB no crackles, wheeze, rhonchi Ext: no edema Skin: warm, dry     Assessment and Plan   #Fibromyalgia/insomnia S:  Compliant with amitriptyline 50 mg (had worsening symptoms on lower dose in past by prior PCP)-due to age, her pharmacist has mentioned coming off amitriptyline but she has been on since 1988 and prefers to continue.  Uses Tylenol as needed. -Uses Xanax to help with sleep in addition to melatonin.  Very difficult to fall asleep if she does not take this.  Fibromyalgia symptoms worsen with poor sleep. No falls or trouble driving next day.  A/P: fibromyalgia reasonably stable- worse with longer activity but tolerable. Insomnia stable/controlled. Continue current medicines.    % Pulmonary-MAI and obstructive bronchiectasis-follows with Dr. Melvyn Novas S: Patient follows with pulmonary clinic.  On regular Dulera.  Albuterol as needed.  Uses azithromycin daily A/P: stable- has upcoming visit early next year   % #CAD status post CABG-follows with Dr. Jones Broom S: Compliant with atorvastatin 80 mg and aspirin 81 mg.  LDL goal under 70 Lab Results  Component Value Date   CHOL 123 05/08/2019   HDL 53.70 05/08/2019   LDLCALC 57 05/08/2019   LDLDIRECT 72.0 02/16/2017   TRIG 62.0 05/08/2019   CHOLHDL 2 05/08/2019   A/P: CAD appears asymptomatic other than respiratory issues attributed to pulmonary disease.  Hyperlipidemia has been at goal.  Continue atorvastatin and aspirin.   #Hypertension S: Compliant with Bystolic 2.5 mg, telmisartan 80mg  A/P: Excellent control-continue current medication  #Hypothyroidism S: Compliant with Synthroid 75 mcg Lab Results  Component Value Date   TSH 1.16 05/08/2019  A/P: Excellent control on last check-continue current medications. Update tsh   #Hyperglycemia/prediabetes S: Compliant with metformin 500 mg twice a day.  Lab Results  Component Value Date   HGBA1C 6.1 05/08/2019   HGBA1C 5.5 04/16/2019   HGBA1C 6.2 09/08/2018  A/P: For the most part A1c's have been pretty stable when comparing adjusted point-of-care to phlebotomy test.  We will update  A1c with labs today. Likely continue current meds  # hand arthritis- may end up with hand surgery- wearing braces at times. Dr. Amedeo Plenty . Has been bothering her a fair amount. voltaren gel with some relief  Recommended follow up: Return in about 6 months (around 05/26/2020) for physical or sooner if needed. Future Appointments  Date Time Provider [place]  01/28/2020 10:00 AM Lelon Perla, MD [facility]  02/26/2020  9:30 AM Tanda Rockers, MD [facility] None   Lab/Order associations:   ICD-10-CM   1. Essential hypertension  I10   2. Hypothyroidism, unspecified type  E03.9 TSH  3. Hyperlipidemia, unspecified hyperlipidemia type  G18.2 COMPLETE METABOLIC PANEL WITH GFR    LDL cholesterol, direct  4. Hyperglycemia  R73.9 Hemoglobin A1c  5. Coronary artery disease due to lipid rich plaque  I25.10    I25.83   6. Insulin resistance  E88.81  7. Insomnia, unspecified type  G47.00   8. Fibromyalgia  M79.7     Meds ordered this encounter  Medications  . ALPRAZolam (XANAX) 0.25 MG tablet    Sig: TAKE 1 TABLET BY MOUTH AT BEDTIME AS NEEDED FOR SLEEP    Dispense:  90 tablet    Refill:  1  . metFORMIN (GLUCOPHAGE) 500 MG tablet    Sig: TAKE 1 TABLET(500 MG) BY MOUTH TWICE DAILY WITH A MEAL    Dispense:  180 tablet    Refill:  3  . SYNTHROID 75 MCG tablet    Sig: Take 1 tablet (75 mcg total) by mouth daily before breakfast.    Dispense:  90 tablet    Refill:  3  . amitriptyline (ELAVIL) 50 MG tablet    Sig: take 1 tablet by mouth at bedtime    Dispense:  90 tablet    Refill:  3   Return precautions advised.  Garret Reddish, MD

## 2019-11-23 NOTE — Patient Instructions (Addendum)
Team -please log covid booster 09/10/19 [place]  Glad you are doing so well!   Please stop by lab before you go If you have mychart- we will send your results within 3 business days of [place] receiving them.  If you do not have mychart- we will call you about results within 5 business days of [place] receiving them.  *please note we are currently using [facility] which has a longer processing time than [place] typically so labs may not come back as quickly as in the past *please also note that you will see labs on mychart as soon as they post. I will later go in and write notes on them- will say "notes from Dr. Yong Channel"

## 2019-11-27 ENCOUNTER — Other Ambulatory Visit: Payer: Self-pay

## 2019-11-27 ENCOUNTER — Encounter: Payer: Self-pay | Admitting: Family Medicine

## 2019-11-27 ENCOUNTER — Ambulatory Visit (INDEPENDENT_AMBULATORY_CARE_PROVIDER_SITE_OTHER): Payer: Medicare Other | Admitting: Family Medicine

## 2019-11-27 VITALS — BP 118/76 | HR 56 | Temp 97.2°F | Resp 18 | Ht 61.0 in | Wt 120.4 lb

## 2019-11-27 DIAGNOSIS — I2583 Coronary atherosclerosis due to lipid rich plaque: Secondary | ICD-10-CM

## 2019-11-27 DIAGNOSIS — I10 Essential (primary) hypertension: Secondary | ICD-10-CM

## 2019-11-27 DIAGNOSIS — M797 Fibromyalgia: Secondary | ICD-10-CM

## 2019-11-27 DIAGNOSIS — E039 Hypothyroidism, unspecified: Secondary | ICD-10-CM | POA: Diagnosis not present

## 2019-11-27 DIAGNOSIS — E785 Hyperlipidemia, unspecified: Secondary | ICD-10-CM | POA: Diagnosis not present

## 2019-11-27 DIAGNOSIS — I2510 Atherosclerotic heart disease of native coronary artery without angina pectoris: Secondary | ICD-10-CM | POA: Diagnosis not present

## 2019-11-27 DIAGNOSIS — K219 Gastro-esophageal reflux disease without esophagitis: Secondary | ICD-10-CM

## 2019-11-27 DIAGNOSIS — R739 Hyperglycemia, unspecified: Secondary | ICD-10-CM | POA: Diagnosis not present

## 2019-11-27 DIAGNOSIS — G4700 Insomnia, unspecified: Secondary | ICD-10-CM

## 2019-11-27 DIAGNOSIS — E8881 Metabolic syndrome: Secondary | ICD-10-CM

## 2019-11-27 MED ORDER — METFORMIN HCL 500 MG PO TABS
ORAL_TABLET | ORAL | 3 refills | Status: DC
Start: 2019-11-27 — End: 2020-08-20

## 2019-11-27 MED ORDER — SYNTHROID 75 MCG PO TABS
75.0000 ug | ORAL_TABLET | Freq: Every day | ORAL | 3 refills | Status: DC
Start: 2019-11-27 — End: 2020-05-28

## 2019-11-27 MED ORDER — ALPRAZOLAM 0.25 MG PO TABS
ORAL_TABLET | ORAL | 1 refills | Status: DC
Start: 2019-11-27 — End: 2020-05-28

## 2019-11-27 MED ORDER — AMITRIPTYLINE HCL 50 MG PO TABS
ORAL_TABLET | ORAL | 3 refills | Status: DC
Start: 2019-11-27 — End: 2020-08-20

## 2019-11-28 LAB — COMPLETE METABOLIC PANEL WITH GFR
AG Ratio: 2 (calc) (ref 1.0–2.5)
ALT: 20 U/L (ref 6–29)
AST: 29 U/L (ref 10–35)
Albumin: 4.2 g/dL (ref 3.6–5.1)
Alkaline phosphatase (APISO): 75 U/L (ref 37–153)
BUN/Creatinine Ratio: 15 (calc) (ref 6–22)
BUN: 15 mg/dL (ref 7–25)
CO2: 24 mmol/L (ref 20–32)
Calcium: 9.6 mg/dL (ref 8.6–10.4)
Chloride: 103 mmol/L (ref 98–110)
Creat: 0.98 mg/dL — ABNORMAL HIGH (ref 0.60–0.93)
GFR, Est African American: 65 mL/min/{1.73_m2} (ref >=60)
GFR, Est Non African American: 56 mL/min/{1.73_m2} — ABNORMAL LOW (ref >=60)
Globulin: 2.1 g/dL (calc) (ref 1.9–3.7)
Glucose, Bld: 89 mg/dL (ref 65–99)
Potassium: 4.7 mmol/L (ref 3.5–5.3)
Sodium: 140 mmol/L (ref 135–146)
Total Bilirubin: 0.6 mg/dL (ref 0.2–1.2)
Total Protein: 6.3 g/dL (ref 6.1–8.1)

## 2019-11-28 LAB — HEMOGLOBIN A1C
Hgb A1c MFr Bld: 5.9 % of total Hgb — ABNORMAL HIGH (ref <5.7)
Mean Plasma Glucose: 123 (calc)
eAG (mmol/L): 6.8 (calc)

## 2019-11-28 LAB — LDL CHOLESTEROL, DIRECT: Direct LDL: 47 mg/dL (ref <100)

## 2019-11-28 LAB — TSH: TSH: 1 mIU/L (ref 0.40–4.50)

## 2019-12-01 ENCOUNTER — Other Ambulatory Visit: Payer: Self-pay | Admitting: Cardiology

## 2019-12-01 DIAGNOSIS — I10 Essential (primary) hypertension: Secondary | ICD-10-CM

## 2019-12-06 ENCOUNTER — Telehealth: Payer: Self-pay | Admitting: Family Medicine

## 2019-12-06 NOTE — Progress Notes (Signed)
  Chronic Care Management   Outreach Note  12/06/2019 Name: Belinda Day MRN: [id number] DOB: 03-01-1945  Referred by: Marin Olp, MD Reason for referral : No chief complaint on file.   An unsuccessful telephone outreach was attempted today. The patient was referred to the pharmacist for assistance with care management and care coordination.   Follow Up Plan:   Lauretta Grill Upstream Scheduler

## 2019-12-07 ENCOUNTER — Telehealth: Payer: Self-pay | Admitting: Family Medicine

## 2019-12-07 NOTE — Chronic Care Management (AMB) (Signed)
°  Chronic Care Management   Note  12/07/2019 Name: Belinda Day MRN: [id number] DOB: [birth year]  Belinda Day is a 74 y.o. year old female who is a primary care patient of Marin Olp, MD. I reached out to [name] by phone today in response to a referral sent by Belinda Day's PCP, Marin Olp, MD.   Belinda Day was given information about Chronic Care Management services today including:  1. CCM service includes personalized support from designated clinical staff supervised by her physician, including individualized plan of care and coordination with other care providers 2. 24/7 contact phone numbers for assistance for urgent and routine care needs. 3. Service will only be billed when office clinical staff spend 20 minutes or more in a month to coordinate care. 4. Only one practitioner may furnish and bill the service in a calendar month. 5. The patient may stop CCM services at any time (effective at the end of the month) by phone call to the office staff.   Patient agreed to services and verbal consent obtained.   Follow up plan:   Belinda Day Upstream Scheduler

## 2019-12-30 ENCOUNTER — Encounter: Payer: Self-pay | Admitting: Family Medicine

## 2020-01-08 DIAGNOSIS — Z1231 Encounter for screening mammogram for malignant neoplasm of breast: Secondary | ICD-10-CM | POA: Diagnosis not present

## 2020-01-21 NOTE — Progress Notes (Signed)
HPI: FU CAD; history of coronary artery disease status post coronary artery bypass graft in 2002. Carotid Dopplers December 2015 showed no significant obstruction. Echocardiogram March 2017 showed normal LV function and trace aortic insufficiency. CTA March 2017 showed no pulmonary embolus. There was bronchiectasis noted. Nuclear study April 2017 showed ejection fraction 64% and no ischemia or infarction. Since last seenshe does have dyspnea related to her lung disease.  It is unchanged.  No orthopnea, PND, pedal edema, chest pain or syncope.  Current Outpatient Medications  Medication Sig Dispense Refill  . acetaminophen (TYLENOL) 650 MG CR tablet every 8 (eight) hours as needed for pain.     Marland Kitchen albuterol (PROAIR HFA) 108 (90 Base) MCG/ACT inhaler Inhale 2 puffs into the lungs every 6 (six) hours as needed for wheezing or shortness of breath. 1 Inhaler 1  . ALPRAZolam (XANAX) 0.25 MG tablet TAKE 1 TABLET BY MOUTH AT BEDTIME AS NEEDED FOR SLEEP 90 tablet 1  . amitriptyline (ELAVIL) 50 MG tablet take 1 tablet by mouth at bedtime 90 tablet 3  . aspirin EC 81 MG tablet Take 81 mg by mouth every other day.    Marland Kitchen atorvastatin (LIPITOR) 80 MG tablet TAKE 1 TABLET(80 MG) BY MOUTH DAILY 90 tablet 3  . azithromycin (ZITHROMAX) 250 MG tablet TAKE 1 TABLET(250 MG) BY MOUTH DAILY 30 tablet 5  . Dextromethorphan-Guaifenesin (MUCINEX DM) 30-600 MG TB12 Take 1 tablet by mouth daily. Patient takes 1200mg     . Melatonin 5 MG TABS Take 5 mg by mouth at bedtime.    . metFORMIN (GLUCOPHAGE) 500 MG tablet TAKE 1 TABLET(500 MG) BY MOUTH TWICE DAILY WITH A MEAL 180 tablet 3  . mometasone-formoterol (DULERA) 200-5 MCG/ACT AERO Inhale 2 puffs into the lungs in the morning and at bedtime. 13 g 6  . nebivolol (BYSTOLIC) 2.5 MG tablet Take 1 tablet (2.5 mg total) by mouth daily. 90 tablet 3  . Respiratory Therapy Supplies (FLUTTER) DEVI Use as directed 1 each 0  . SYNTHROID 75 MCG tablet Take 1 tablet (75 mcg total)  by mouth daily before breakfast. 90 tablet 3  . telmisartan (MICARDIS) 80 MG tablet TAKE 1 TABLET(80 MG) BY MOUTH DAILY 90 tablet 3   No current facility-administered medications for this visit.     Past Medical History:  Diagnosis Date  . Bronchiectasis    oxygen at night in the past  . CAD (coronary artery disease)   . Diverticulitis 2014  . GERD (gastroesophageal reflux disease)   . History of shingles 04/2012  . HTN (hypertension)   . Hyperlipidemia   . Hypothyroidism   . MAI (mycobacterium avium-intracellulare) ([place])   . Neuritis of upper extremity     Past Surgical History:  Procedure Laterality Date  . CORONARY ARTERY BYPASS GRAFT  2004   x3 CABG    Social History   Socioeconomic History  . Marital status: Single    Spouse name: Not on file  . Number of children: Not on file  . Years of education: Not on file  . Highest education level: Not on file  Occupational History  . Occupation: Retired   Tobacco Use  . Smoking status: Former Smoker    Packs/day: 1.00    Years: 20.00    Pack years: 20.00    Types: Cigarettes    Quit date: 01/25/1970    Years since quitting: 50.0  . Smokeless tobacco: Never Used  Substance and Sexual Activity  . Alcohol use: No  .  Drug use: No  . Sexual activity: Not on file  Other Topics Concern  . Not on file  Social History Narrative   Family: Single never married, no children, cat and rehabs turtles and tortoise   Went to [employer] in [place] and social work, some business courses at [employer], EMT for 6 years.    LIves alone. Completely independent.    Lives in retirement community.       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 Colon cancer Mother   . Hyperlipidemia Mother   . Hypertension Mother   . Heart disease Mother   . Stroke Mother   . Diabetes Mother   . Colon cancer Father   . Arthritis Father   . Hyperlipidemia Father   . Hypertension Father   . Heart disease Father   . Stroke Father   . Atopy Neg Hx     ROS: no fevers or chills, productive cough, hemoptysis, dysphasia, odynophagia, melena, hematochezia, dysuria, hematuria, rash, seizure activity, orthopnea, PND, pedal edema, claudication. Remaining systems are negative.  Physical Exam: Well-developed well-nourished in no acute distress.  Skin is warm and dry.  HEENT is normal.  Neck is supple.  Chest is clear to auscultation with normal expansion.  Cardiovascular exam is regular rate and rhythm.  Abdominal exam nontender or distended. No masses palpated. Extremities show no edema. neuro grossly intact  ECG-normal sinus rhythm at a rate of 72, no significant ST changes.  Personally reviewed  A/P  1 CAD-patient denies chest pain.  Plan to continue medical therapy with aspirin and statin.  2 hypertension-blood pressure controlled.  Continue present medications and follow.  3 hyperlipidemia-continue statin.  4 history of carotid disease-no significant obstruction on most recent Dopplers.  Olga Millers, MD

## 2020-01-28 ENCOUNTER — Other Ambulatory Visit: Payer: Self-pay

## 2020-01-28 ENCOUNTER — Encounter: Payer: Self-pay | Admitting: Cardiology

## 2020-01-28 ENCOUNTER — Ambulatory Visit: Payer: Medicare Other | Admitting: Cardiology

## 2020-01-28 VITALS — BP 134/82 | HR 72 | Ht 61.0 in | Wt 117.0 lb

## 2020-01-28 DIAGNOSIS — I10 Essential (primary) hypertension: Secondary | ICD-10-CM | POA: Diagnosis not present

## 2020-01-28 DIAGNOSIS — I2510 Atherosclerotic heart disease of native coronary artery without angina pectoris: Secondary | ICD-10-CM

## 2020-01-28 DIAGNOSIS — E7800 Pure hypercholesterolemia, unspecified: Secondary | ICD-10-CM | POA: Diagnosis not present

## 2020-01-28 NOTE — Patient Instructions (Signed)

## 2020-01-29 ENCOUNTER — Ambulatory Visit: Payer: Medicare Other

## 2020-01-29 DIAGNOSIS — E785 Hyperlipidemia, unspecified: Secondary | ICD-10-CM

## 2020-01-29 DIAGNOSIS — I10 Essential (primary) hypertension: Secondary | ICD-10-CM

## 2020-01-29 DIAGNOSIS — R739 Hyperglycemia, unspecified: Secondary | ICD-10-CM

## 2020-01-29 NOTE — Patient Instructions (Signed)
Belinda Day,  Thank you for taking the time to review your medications with me today.  I have included our care plan/goals in the following pages. Please review and call me at [phone] with any questions!  Thanks! Belinda Day, Pharm.D., BCGP Clinical Pharmacist [facility] [phone]  Goals Addressed            This Visit's Progress   . PharmD Care Plan       CARE PLAN ENTRY (see longitudinal plan of care for additional care plan information)  Current Barriers:  . Chronic Disease Management support, education, and care coordination needs related to Hypertension, Hyperlipidemia, and Diabetes   Hypertension BP Readings from Last 3 Encounters:  01/28/20 134/82  11/27/19 118/76  08/27/19 128/70   . Pharmacist Clinical Goal(s): o Over the next 365 days, patient will work with PharmD and providers to maintain BP goal <130/80 . Current regimen:  o Telmisartan 80 mg once daily  o Bystolic 2.5 mg once daily . Interventions: o Reviewed patient assistance - agreed to move forward o Reviewed diet and exercise - Maintain a healthy weight and exercise regularly, as directed by your health care provider. Eat healthy foods, such as: Lean proteins, complex carbohydrates, fresh fruits and vegetables, low-fat dairy products, healthy fats. . Patient self care activities - Over the next 365 days, patient will: o Check BP at least once every 1-2 weeks, document, and provide at future appointments o Ensure daily salt intake < 2300 mg/day  Hyperlipidemia Lab Results  Component Value Date/Time   LDLCALC 57 05/08/2019 08:45 AM   LDLDIRECT 47 11/27/2019 09:50 AM   . Pharmacist Clinical Goal(s): o Over the next 365 days, patient will work with PharmD and providers to maintain LDL goal < 70 . Current regimen:  o Atorvastatin 80 mg once daily . Interventions: o Reviewed side effects/tolerability - none noted at this time . Patient self care  activities - Over the next 365 days, patient will: o Continue current management  Hyperglycemia / Prediabetes Lab Results  Component Value Date/Time   HGBA1C 5.9 (H) 11/27/2019 09:50 AM   HGBA1C 6.1 05/08/2019 08:45 AM   . Pharmacist Clinical Goal(s): o Over the next 365 days, patient will work with PharmD and providers to maintain A1c goal <6.5% . Current regimen:  . Metformin 500 mg twice daily with meals . Interventions: o Reviewed diet/exercise - see high blood pressure . Patient self care activities - Over the next 365 days, patient will: o Check blood sugar if directed, document, and provide at future appointments o Contact provider with any episodes of hypoglycemia  Medication management . Pharmacist Clinical Goal(s): o Over the next 365 days, patient will work with PharmD and providers to maintain optimal medication adherence o Ensure medication accessibility . Current pharmacy: [facility] . Interventions o Comprehensive medication review performed. o Continue current medication management strategy. o Reviewed patient assistance for Bystolic and Maintenance inhaler - will move forward with application . Patient self care activities - Over the next 365 days, patient will: o Take medications as prescribed o Report any questions or concerns to PharmD and/or provider(s) Initial goal documentation.      The patient verbalized understanding of instructions provided today and agreed to receive a mailed copy of patient instruction and/or educational materials. Telephone follow up appointment with pharmacy team member scheduled for: See next appointment with "Care Management Staff" under "What's Next" below.   Hypertension, Adult High  blood pressure (hypertension) is when the force of blood pumping through the arteries is too strong. The arteries are the blood vessels that carry blood from the heart throughout the body. Hypertension forces the heart to work harder to pump blood  and may cause arteries to become narrow or stiff. Untreated or uncontrolled hypertension can cause a heart attack, heart failure, a stroke, kidney disease, and other problems. A blood pressure reading consists of a higher number over a lower number. Ideally, your blood pressure should be below 120/80. The first ("top") number is called the systolic pressure. It is a measure of the pressure in your arteries as your heart beats. The second ("bottom") number is called the diastolic pressure. It is a measure of the pressure in your arteries as the heart relaxes. What are the causes? The exact cause of this condition is not known. There are some conditions that result in or are related to high blood pressure. What increases the risk? Some risk factors for high blood pressure are under your control. The following factors may make you more likely to develop this condition:  Smoking.  Having type 2 diabetes mellitus, high cholesterol, or both.  Not getting enough exercise or physical activity.  Being overweight.  Having too much fat, sugar, calories, or salt (sodium) in your diet.  Drinking too much alcohol. Some risk factors for high blood pressure may be difficult or impossible to change. Some of these factors include:  Having chronic kidney disease.  Having a family history of high blood pressure.  Age. Risk increases with age.  Race. You may be at higher risk if you are African American.  Gender. Men are at higher risk than women before age 34. After age 71, women are at higher risk than men.  Having obstructive sleep apnea.  Stress. What are the signs or symptoms? High blood pressure may not cause symptoms. Very high blood pressure (hypertensive crisis) may cause:  Headache.  Anxiety.  Shortness of breath.  Nosebleed.  Nausea and vomiting.  Vision changes.  Severe chest pain.  Seizures. How is this diagnosed? This condition is diagnosed by measuring your blood  pressure while you are seated, with your arm resting on a flat surface, your legs uncrossed, and your feet flat on the floor. The cuff of the blood pressure monitor will be placed directly against the skin of your upper arm at the level of your heart. It should be measured at least twice using the same arm. Certain conditions can cause a difference in blood pressure between your right and left arms. Certain factors can cause blood pressure readings to be lower or higher than normal for a short period of time:  When your blood pressure is higher when you are in a health care provider's office than when you are at home, this is called white coat hypertension. Most people with this condition do not need medicines.  When your blood pressure is higher at home than when you are in a health care provider's office, this is called masked hypertension. Most people with this condition may need medicines to control blood pressure. If you have a high blood pressure reading during one visit or you have normal blood pressure with other risk factors, you may be asked to:  Return on a different day to have your blood pressure checked again.  Monitor your blood pressure at home for 1 week or longer. If you are diagnosed with hypertension, you may have other blood or imaging tests  to help your health care provider understand your overall risk for other conditions. How is this treated? This condition is treated by making healthy lifestyle changes, such as eating healthy foods, exercising more, and reducing your alcohol intake. Your health care provider may prescribe medicine if lifestyle changes are not enough to get your blood pressure under control, and if:  Your systolic blood pressure is above 130.  Your diastolic blood pressure is above 80. Your personal target blood pressure may vary depending on your medical conditions, your age, and other factors. Follow these instructions at home: Eating and  drinking   Eat a diet that is high in fiber and potassium, and low in sodium, added sugar, and fat. An example eating plan is called the DASH (Dietary Approaches to Stop Hypertension) diet. To eat this way: ? Eat plenty of fresh fruits and vegetables. Try to fill one half of your plate at each meal with fruits and vegetables. ? Eat whole grains, such as whole-wheat pasta, brown rice, or whole-grain bread. Fill about one fourth of your plate with whole grains. ? Eat or drink low-fat dairy products, such as skim milk or low-fat yogurt. ? Avoid fatty cuts of meat, processed or cured meats, and poultry with skin. Fill about one fourth of your plate with lean proteins, such as fish, chicken without skin, beans, eggs, or tofu. ? Avoid pre-made and processed foods. These tend to be higher in sodium, added sugar, and fat.  Reduce your daily sodium intake. Most people with hypertension should eat less than 1,500 mg of sodium a day.  Do not drink alcohol if: ? Your health care provider tells you not to drink. ? You are pregnant, may be pregnant, or are planning to become pregnant.  If you drink alcohol: ? Limit how much you use to:  0-1 drink a day for women.  0-2 drinks a day for men. ? Be aware of how much alcohol is in your drink. In the [place] one drink equals one 12 oz bottle of beer (355 mL), one 5 oz glass of wine (148 mL), or one 1 oz glass of hard liquor (44 mL). Lifestyle   Work with your health care provider to maintain a healthy body weight or to lose weight. Ask what an ideal weight is for you.  Get at least 30 minutes of exercise most days of the week. Activities may include walking, swimming, or biking.  Include exercise to strengthen your muscles (resistance exercise), such as Pilates or lifting weights, as part of your weekly exercise routine. Try to do these types of exercises for 30 minutes at least 3 days a week.  Do not use any products that contain nicotine or tobacco,  such as cigarettes, e-cigarettes, and chewing tobacco. If you need help quitting, ask your health care provider.  Monitor your blood pressure at home as told by your health care provider.  Keep all follow-up visits as told by your health care provider. This is important. Medicines  Take over-the-counter and prescription medicines only as told by your health care provider. Follow directions carefully. Blood pressure medicines must be taken as prescribed.  Do not skip doses of blood pressure medicine. Doing this puts you at risk for problems and can make the medicine less effective.  Ask your health care provider about side effects or reactions to medicines that you should watch for. Contact a health care provider if you:  Think you are having a reaction to a medicine you are taking.  Have headaches that keep coming back (recurring).  Feel dizzy.  Have swelling in your ankles.  Have trouble with your vision. Get help right away if you:  Develop a severe headache or confusion.  Have unusual weakness or numbness.  Feel faint.  Have severe pain in your chest or abdomen.  Vomit repeatedly.  Have trouble breathing. Summary  Hypertension is when the force of blood pumping through your arteries is too strong. If this condition is not controlled, it may put you at risk for serious complications.  Your personal target blood pressure may vary depending on your medical conditions, your age, and other factors. For most people, a normal blood pressure is less than 120/80.  Hypertension is treated with lifestyle changes, medicines, or a combination of both. Lifestyle changes include losing weight, eating a healthy, low-sodium diet, exercising more, and limiting alcohol. This information is not intended to replace advice given to you by your health care provider. Make sure you discuss any questions you have with your health care provider. Document Revised: 09/21/2017 Document Reviewed:  09/21/2017 Elsevier Patient Education  2020 [employer].

## 2020-01-29 NOTE — Progress Notes (Signed)
Chronic Care Management Pharmacy Name: Belinda Day     MRN: [id number]     DOB: 1945/03/15  Chief Complaint/ HPI Belinda Day, 75 y.o., female, presents for their initial CCM visit with the clinical pharmacist via telephone due to COVID-19 pandemic.  PCP: Marin Olp, MD Encounter Diagnoses  Name Primary?  . Hyperlipidemia, unspecified hyperlipidemia type Yes  . Essential hypertension   . Hyperglycemia      Patient Active Problem List   Diagnosis Date Noted  . Senile purpura ([place]) 05/15/2019  . Insulin resistance 09/07/2017  . Cystitis 11/05/2016  . Cerebrovascular disease 01/31/2015  . Former smoker 08/05/2014  . Hyperglycemia 08/05/2014  . Constipation 05/02/2014  . History of colonic polyps 05/02/2014  . GERD (gastroesophageal reflux disease) 02/24/2014  . Hemoptysis 02/24/2014  . Benign paroxysmal positional vertigo 04/18/2013  . Insomnia 02/12/2013  . Diverticulitis 12/21/2012  . Nocturnal hypoxemia 12/12/2012  . Fibromyalgia 12/28/2011  . Osteopenia 08/14/2011  . Hypothyroidism 04/06/2010  . MAI (mycobacterium avium-intracellulare) ([place]) 11/19/2009  . Hyperlipemia 07/03/2007  . Essential hypertension 07/03/2007  . CAD (coronary artery disease) s/p CABG 07/03/2007  . Obstructive bronchiectasis ([place]) with GOLD II/III criteria 07/03/2007   Past Surgical History:  Procedure Laterality Date  . CORONARY ARTERY BYPASS GRAFT  2004   x3 CABG   Family History  Problem Relation Age of Onset  . Colon cancer Mother   . Hyperlipidemia Mother   . Hypertension Mother   . Heart disease Mother   . Stroke Mother   . Diabetes Mother   . Colon cancer Father   . Arthritis Father   . Hyperlipidemia Father   . Hypertension Father   . Heart disease Father   . Stroke Father   . Atopy Neg Hx    Allergies  Allergen Reactions  . Bactrim [Sulfamethoxazole-Trimethoprim] Hives, Itching and Other (See Comments)    Bruised like areas on body  . Ciprofloxacin Other  (See Comments)    Body aches  . Codeine Nausea Only    REACTION: nausea  . Erythromycin Nausea Only    REACTION: nausea  . Levofloxacin Other (See Comments)    REACTION: aches   Outpatient Encounter Medications as of 01/29/2020  Medication Sig  . ALPRAZolam (XANAX) 0.25 MG tablet TAKE 1 TABLET BY MOUTH AT BEDTIME AS NEEDED FOR SLEEP  . amitriptyline (ELAVIL) 50 MG tablet take 1 tablet by mouth at bedtime  . Apoaequorin (PREVAGEN EXTRA STRENGTH) 20 MG CAPS Take 1 capsule by mouth.  Marland Kitchen aspirin EC 81 MG tablet Take 81 mg by mouth daily.  Marland Kitchen atorvastatin (LIPITOR) 80 MG tablet TAKE 1 TABLET(80 MG) BY MOUTH DAILY  . azithromycin (ZITHROMAX) 250 MG tablet TAKE 1 TABLET(250 MG) BY MOUTH DAILY  . Dextromethorphan-Guaifenesin (MUCINEX DM) 30-600 MG TB12 Take 1 tablet by mouth daily. Patient takes 1232m  . Melatonin 5 MG TABS Take 5 mg by mouth at bedtime.  . metFORMIN (GLUCOPHAGE) 500 MG tablet TAKE 1 TABLET(500 MG) BY MOUTH TWICE DAILY WITH A MEAL  . mometasone-formoterol (DULERA) 200-5 MCG/ACT AERO Inhale 2 puffs into the lungs in the morning and at bedtime.  . nebivolol (BYSTOLIC) 2.5 MG tablet Take 1 tablet (2.5 mg total) by mouth daily.  .Marland KitchenSYNTHROID 75 MCG tablet Take 1 tablet (75 mcg total) by mouth daily before breakfast.  . acetaminophen (TYLENOL) 650 MG CR tablet every 8 (eight) hours as needed for pain.   .Marland Kitchenalbuterol (PROAIR HFA) 108 (90 Base) MCG/ACT inhaler Inhale 2 puffs  into the lungs every 6 (six) hours as needed for wheezing or shortness of breath.  Marland Kitchen Respiratory Therapy Supplies (FLUTTER) DEVI Use as directed  . telmisartan (MICARDIS) 80 MG tablet TAKE 1 TABLET(80 MG) BY MOUTH DAILY   No facility-administered encounter medications on file as of 01/29/2020.   Patient Care Team    Relationship Specialty Notifications Start End  Marin Olp, MD PCP - General Family Medicine  05/02/14   Lelon Perla, MD PCP - Cardiology Cardiology Admissions 04/18/17   Opthamology, [facility] Physician Ophthalmology  05/08/19   Tanda Rockers, MD Consulting Physician Pulmonary Disease  05/08/19   Roseanne Kaufman, MD Consulting Physician Orthopedic Surgery  05/08/19   Martin[place], Amy, MD Consulting Physician Dermatology  05/15/19    Current Diagnosis/Assessment: Goals Addressed            This Visit's Progress   . PharmD Care Plan       CARE PLAN ENTRY (see longitudinal plan of care for additional care plan information)  Current Barriers:  . Chronic Disease Management support, education, and care coordination needs related to Hypertension, Hyperlipidemia, and Diabetes   Hypertension BP Readings from Last 3 Encounters:  01/28/20 134/82  11/27/19 118/76  08/27/19 128/70   . Pharmacist Clinical Goal(s): o Over the next 365 days, patient will work with PharmD and providers to maintain BP goal <130/80 . Current regimen:  o Telmisartan 80 mg once daily  o Bystolic 2.5 mg once daily . Interventions: o Reviewed patient assistance - agreed to move forward o Reviewed diet and exercise - Maintain a healthy weight and exercise regularly, as directed by your health care provider. Eat healthy foods, such as: Lean proteins, complex carbohydrates, fresh fruits and vegetables, low-fat dairy products, healthy fats. . Patient self care activities - Over the next 365 days, patient will: o Check BP at least once every 1-2 weeks, document, and provide at future appointments o Ensure daily salt intake < 2300 mg/day  Hyperlipidemia Lab Results  Component Value Date/Time   LDLCALC 57 05/08/2019 08:45 AM   LDLDIRECT 47 11/27/2019 09:50 AM   . Pharmacist Clinical Goal(s): o Over the next 365 days, patient will work with PharmD and providers to maintain LDL goal < 70 . Current regimen:  o Atorvastatin 80 mg once daily . Interventions: o Reviewed side effects/tolerability - none noted at this time . Patient self care activities - Over the next 365 days, patient will: o Continue  current management  Hyperglycemia / Prediabetes Lab Results  Component Value Date/Time   HGBA1C 5.9 (H) 11/27/2019 09:50 AM   HGBA1C 6.1 05/08/2019 08:45 AM   . Pharmacist Clinical Goal(s): o Over the next 365 days, patient will work with PharmD and providers to maintain A1c goal <6.5% . Current regimen:  . Metformin 500 mg twice daily with meals . Interventions: o Reviewed diet/exercise - see high blood pressure . Patient self care activities - Over the next 365 days, patient will: o Check blood sugar if directed, document, and provide at future appointments o Contact provider with any episodes of hypoglycemia  Medication management . Pharmacist Clinical Goal(s): o Over the next 365 days, patient will work with PharmD and providers to maintain optimal medication adherence o Ensure medication accessibility . Current pharmacy: [facility] . Interventions o Comprehensive medication review performed. o Continue current medication management strategy. o Reviewed patient assistance for Bystolic and Maintenance inhaler - will move forward with application . Patient self care activities - Over the  next 365 days, patient will: o Take medications as prescribed o Report any questions or concerns to PharmD and/or provider(s) Initial goal documentation.      Hypertension   BP goal <130/80  BP Readings from Last 3 Encounters:  01/28/20 134/82  11/27/19 118/76  08/27/19 128/70    BMP Latest Ref Rng & Units 11/27/2019 05/08/2019 01/04/2019  Glucose 65 - 99 mg/dL 89 96 146(H)  BUN 7 - 25 mg/dL 15 10 12   Creatinine 0.60 - 0.93 mg/dL 0.98(H) 0.77 0.85  BUN/Creat Ratio 6 - 22 (calc) 15 - 14  Sodium 135 - 146 mmol/L 140 136 143  Potassium 3.5 - 5.3 mmol/L 4.7 3.7 4.5  Chloride 98 - 110 mmol/L 103 102 102  CO2 20 - 32 mmol/L 24 26 24   Calcium 8.6 - 10.4 mg/dL 9.6 8.8 9.5   Previous medications: lisinopril, valsartan, losartan. Patient checks BP at home infrequently. Recent home  readings: n/a. Some concern with cost of bystolic, switched to generic but cost still high at ~$70  Denies dizziness. Patient is currently at goal on the following medications:  . Telmisartan 80 mg once daily . Bystolic (nebivolol) 2.5 mg once daily   We discussed patient assistance - agreed to move forward with Bystolic PAP.  Plan  Continue current medications.  Hyperglycemia (prediabetes)   A1c goal < 6.0%  Lab Results  Component Value Date/Time   HGBA1C 5.9 (H) 11/27/2019 09:50 AM   HGBA1C 6.1 05/08/2019 08:45 AM   HGBA1C 5.5 04/16/2019 04:32 PM   HGBA1C 6.2 09/08/2018 09:00 AM   HGBA1C 6.0 03/10/2018 09:32 AM   HGBA1C 6.4 09/07/2017 08:51 AM   GFR 72.86 05/08/2019 08:45 AM   GFR 72.99 09/08/2018 09:00 AM   GFR 65.21 03/10/2018 09:32 AM   GFR 65.81 09/07/2017 08:51 AM   GFR 71.44 02/25/2017 08:19 AM    Previous medications: n/a. Recent FBG readings: n/a. Very active outdoors. Reducing sugar intake, substituting fruits for sweets. Trying to improve vegetable intake.  Denies any side effects including GI. Patient is currently at goal on: Marland Kitchen Metformin 500 mg twice daily with a meal   We discussed diet/exercise - Maintain a healthy weight and exercise regularly, as directed by your health care provider. Eat healthy foods, such as: Lean proteins, complex carbohydrates, fresh fruits and vegetables, low-fat dairy products, healthy fats.  Plan  Continue current medications.  Hyperlipidemia   LDL goal < 70  Lipid Panel     Component Value Date/Time   CHOL 123 05/08/2019 0845   TRIG 62.0 05/08/2019 0845   HDL 53.70 05/08/2019 0845   LDLCALC 57 05/08/2019 0845   LDLDIRECT 47 11/27/2019 0950    Hepatic Function Latest Ref Rng & Units 11/27/2019 05/08/2019 09/08/2018  Total Protein 6.1 - 8.1 g/dL 6.3 6.1 6.1  Albumin 3.5 - 5.2 g/dL - 4.1 4.1  AST 10 - 35 U/L 29 23 28   ALT 6 - 29 U/L 20 16 25   Alk Phosphatase 39 - 117 U/L - 84 76  Total Bilirubin 0.2 - 1.2 mg/dL 0.6 0.5  0.4  Bilirubin, Direct 0.0 - 0.3 mg/dL - - -    Previous medications: n/a.  Denies side effects or tolerability concerns. Patient is currently at goal the following medications:  . Atorvastatin 80 mg once daily   Secondary prevention. CAD s/p CABG. Denies any abnormal bruising, bleeding from nose or gums or blood in urine or stool. Patient is currently controlled on the following medications:  . Aspirin 81 mg  once daily  Counseled on statin, betablocker, antiplatelet - no side effects or concerns noted.   Plan  Continue current medications.  MAI, Obstructive bronchiectasis  Followed by Dr Melvyn Novas, pulmonary clinic.  Cost concern with Dulera, although better than symbicort. Hasn't used rescue inhaler recently, reports that inhaler could be expired and plans to renew at 02/26/20 visit with Dr. Melvyn Novas. Patient is currently controlled on the following medications:  . Azithromycin 5 mg daily . Dulera 200-5 - 2 puffs into lungs in the morning and at bedtime . Albuterol inhaler prn  Reviewed patient assistance - Dulera, or symbicort as applicable  Plan  Continue current medications.  Osteopenia   Last DEXA Scan: 03/12/2019.   Lab Results  Component Value Date   VD25OH 50 02/01/2012    Previous medications: n/a. Current medications: Marland Kitchen Vitamin d3 5000 units daily . Calcium citrate 400 mg  . Calcium-mg-zinc (1000 mg calcium) . Bone building supplement with 2000 units vitamin D3  We discussed: Recommend [phone] units of vitamin D daily. Recommend 1200 mg of calcium daily from dietary and supplemental sources. Recommend weight-bearing and muscle strengthening exercises for building and maintaining bone density.  Plan  Continue current medications. Pt to consider step down to vitamin D3 </= 5000 units daily  Insomnia, fibromyalgia  Previous medications: previously tried dose reduction w/ amitriptyline without success.  Feels well rested in the morning, no hangover-type effects.    Denies side effects, reports ongoing benefit / controlled on the following medications:  . Amitriptyline 50 mg one tablet daily at bedtime  . Alprazolam 0.25 mg - take 1 tablet by mouth at bedtime as needed for sleep . Melatonin 5 mg once every night before bedtime . Tylenol as needed  Plan  Continue current medications.  Hypothyroidism   Lab Results  Component Value Date/Time   TSH 1.00 11/27/2019 09:50 AM   TSH 1.16 05/08/2019 08:45 AM   FREET4 1.14 02/01/2012 10:03 AM   FREET4 0.94 08/02/2011 09:07 AM   Patient has failed these meds in past: n/a. Patient is currently controlled on the following medications:  . Synthroid 75 mcg once daily  We discussed:  proper administration - take first thing in morning ~30 minutes before other medications or food.  Plan  Continue current medications  Vaccines   Immunization History  Administered Date(s) Administered  . H1N1 01/02/2008  . Influenza Split 10/14/2010, 10/07/2011  . Influenza, High Dose Seasonal PF 11/05/2016  . Influenza,inj,Quad PF,6+ Mos 10/02/2012, 10/16/2015, 11/07/2017, 10/05/2018, 10/30/2019  . Influenza-Unspecified 09/25/2013, 10/15/2014, 11/07/2017  . Moderna Sars-Covid-2 Vaccination 02/09/2019, 03/12/2019, 09/05/2019  . Pneumococcal Conjugate-13 08/14/2013  . Pneumococcal Polysaccharide-23 10/29/2011  . Td 05/15/2019  . Zoster Recombinat (Shingrix) 06/25/2017, 09/30/2017   Reviewed and discussed patient's vaccination history.   09/05/2019 - moderna booster - vaccine hx updated.  Plan  No recommendations at this time.   Medication Management / Care Coordination   Receives prescription medications from:  [facility] [place], [address] [place] [address] Phone: [phone] Fax: [place] [place] [address] Phone: [phone] Fax: [phone]   Denies any issues with current medication management.   Plan  Continue current medication management strategy. ___________________________ SDOH (Social Determinants of Health) assessments performed: Yes. Future Appointments  Date Time Provider [place]  02/26/2020  9:30 AM Tanda Rockers, MD [facility] None  05/27/2020  8:00 AM Marin Olp, MD [facility]  09/30/2020  1:30 PM [facility] CCM PHARMACIST [facility]   Visit follow-up:  . CPA follow-up: Patient assistance applications - Bystolic w/ cardiology, Ruthe Mannan if applicable - unsure if there is a PAP. Previously on symbicort but limited by cost (provide symbicort application if no dulera, pt will bring to appt with dr wert). . RPH follow-up: 8 month f/u.  Madelin Rear, Pharm.D., BCGP Clinical Pharmacist [facility] [phone]

## 2020-01-30 ENCOUNTER — Telehealth: Payer: Self-pay

## 2020-01-30 NOTE — Progress Notes (Incomplete)
    Chronic Care Management Pharmacy Assistant   Name: Belinda Day  MRN: [id number] DOB: 02/24/1945  Reason for Encounter: Patient Assistant Application  PCP : Shelva Majestic, MD  Allergies:   Allergies  Allergen Reactions  . Bactrim [Sulfamethoxazole-Trimethoprim] Hives, Itching and Other (See Comments)    Bruised like areas on body  . Ciprofloxacin Other (See Comments)    Body aches  . Codeine Nausea Only    REACTION: nausea  . Erythromycin Nausea Only    REACTION: nausea  . Levofloxacin Other (See Comments)    REACTION: aches    Medications: Outpatient Encounter Medications as of 01/30/2020  Medication Sig  . acetaminophen (TYLENOL) 650 MG CR tablet every 8 (eight) hours as needed for pain.   Marland Kitchen albuterol (PROAIR HFA) 108 (90 Base) MCG/ACT inhaler Inhale 2 puffs into the lungs every 6 (six) hours as needed for wheezing or shortness of breath.  . ALPRAZolam (XANAX) 0.25 MG tablet TAKE 1 TABLET BY MOUTH AT BEDTIME AS NEEDED FOR SLEEP  . amitriptyline (ELAVIL) 50 MG tablet take 1 tablet by mouth at bedtime  . Apoaequorin (PREVAGEN EXTRA STRENGTH) 20 MG CAPS Take 1 capsule by mouth.  Marland Kitchen aspirin EC 81 MG tablet Take 81 mg by mouth daily.  Marland Kitchen atorvastatin (LIPITOR) 80 MG tablet TAKE 1 TABLET(80 MG) BY MOUTH DAILY  . azithromycin (ZITHROMAX) 250 MG tablet TAKE 1 TABLET(250 MG) BY MOUTH DAILY  . Dextromethorphan-Guaifenesin (MUCINEX DM) 30-600 MG TB12 Take 1 tablet by mouth daily. Patient takes 1200mg   . Melatonin 5 MG TABS Take 5 mg by mouth at bedtime.  . metFORMIN (GLUCOPHAGE) 500 MG tablet TAKE 1 TABLET(500 MG) BY MOUTH TWICE DAILY WITH A MEAL  . mometasone-formoterol (DULERA) 200-5 MCG/ACT AERO Inhale 2 puffs into the lungs in the morning and at bedtime.  . nebivolol (BYSTOLIC) 2.5 MG tablet Take 1 tablet (2.5 mg total) by mouth daily.  Respiratory Therapy Supplies (FLUTTER) DEVI Use as directed  . SYNTHROID 75 MCG tablet Take 1 tablet (75 mcg total) by mouth daily before  breakfast.  . telmisartan (MICARDIS) 80 MG tablet TAKE 1 TABLET(80 MG) BY MOUTH DAILY   No facility-administered encounter medications on file as of 01/30/2020.    Current Diagnosis: Patient Active Problem List   Diagnosis Date Noted  . Senile purpura ([facility]) 05/15/2019  . Insulin resistance 09/07/2017  . Cystitis 11/05/2016  . Cerebrovascular disease 01/31/2015  . Former smoker 08/05/2014  . Hyperglycemia 08/05/2014  . Constipation 05/02/2014  . History of colonic polyps 05/02/2014  . GERD (gastroesophageal reflux disease) 02/24/2014  . Hemoptysis 02/24/2014  . Benign paroxysmal positional vertigo 04/18/2013  . Insomnia 02/12/2013  . Diverticulitis 12/21/2012  . Nocturnal hypoxemia 12/12/2012  . Fibromyalgia 12/28/2011  . Osteopenia 08/14/2011  . Hypothyroidism 04/06/2010  . MAI (mycobacterium avium-intracellulare) ([facility]) 11/19/2009  . Hyperlipemia 07/03/2007  . Essential hypertension 07/03/2007  . CAD (coronary artery disease) s/p CABG 07/03/2007  . Obstructive bronchiectasis ([facility]) with GOLD II/III criteria 07/03/2007        Follow-Up:  Pharmacist Review

## 2020-02-01 MED ORDER — ALBUTEROL SULFATE HFA 108 (90 BASE) MCG/ACT IN AERS: 2.0000 | INHALATION_SPRAY | Freq: Four times a day (QID) | RESPIRATORY_TRACT | 1 refills | Status: DC | PRN

## 2020-02-09 DIAGNOSIS — R1084 Generalized abdominal pain: Secondary | ICD-10-CM | POA: Diagnosis not present

## 2020-02-09 DIAGNOSIS — K5900 Constipation, unspecified: Secondary | ICD-10-CM | POA: Diagnosis not present

## 2020-02-10 ENCOUNTER — Encounter: Payer: Self-pay | Admitting: Family Medicine

## 2020-02-13 ENCOUNTER — Encounter: Payer: Self-pay | Admitting: Family Medicine

## 2020-02-13 DIAGNOSIS — R1032 Left lower quadrant pain: Secondary | ICD-10-CM | POA: Diagnosis not present

## 2020-02-13 DIAGNOSIS — R935 Abnormal findings on diagnostic imaging of other abdominal regions, including retroperitoneum: Secondary | ICD-10-CM | POA: Diagnosis not present

## 2020-02-13 DIAGNOSIS — Z800 Family history of malignant neoplasm of digestive organs: Secondary | ICD-10-CM | POA: Diagnosis not present

## 2020-02-13 DIAGNOSIS — K5904 Chronic idiopathic constipation: Secondary | ICD-10-CM | POA: Diagnosis not present

## 2020-02-13 DIAGNOSIS — K5792 Diverticulitis of intestine, part unspecified, without perforation or abscess without bleeding: Secondary | ICD-10-CM | POA: Diagnosis not present

## 2020-02-13 DIAGNOSIS — R160 Hepatomegaly, not elsewhere classified: Secondary | ICD-10-CM | POA: Diagnosis not present

## 2020-02-13 NOTE — Telephone Encounter (Signed)
Please call pt and schedule follow up appt per Dr. Yong Channel.

## 2020-02-13 NOTE — Telephone Encounter (Signed)
Called pt and LVM to schedule appt

## 2020-02-26 ENCOUNTER — Encounter: Payer: Self-pay | Admitting: Internal Medicine

## 2020-02-26 ENCOUNTER — Ambulatory Visit: Payer: Medicare Other | Admitting: Internal Medicine

## 2020-02-26 ENCOUNTER — Other Ambulatory Visit: Payer: Self-pay

## 2020-02-26 DIAGNOSIS — J479 Bronchiectasis, uncomplicated: Secondary | ICD-10-CM | POA: Diagnosis not present

## 2020-02-26 NOTE — Patient Instructions (Signed)
No change in medications    Please schedule a follow up visit in 6  months but call sooner if needed  

## 2020-02-26 NOTE — Progress Notes (Signed)
Subjective:    Patient ID: Belinda Day, female   DOB: 1945-05-31    MRN: [id number]   Brief patient profile:  75  yowf MM/ quit smoking 1972 with documented right middle lobe syndrome and evidence of bronchiectasis by CT scan in March 2002  And GOLD II criteria for copd 09/2010     History of Present Illness  08/08/07 FOB with classic cobblestoning and MAI on culture.   08/15/07 given Levaquin x 10 days with resolution bloody mucus, but "felt she had flu the whole time" with aches, feverish   August 29, 2007 ov: first post bronch co still coughing up mucus clear and initiate rx with symbicort/ [place]   October 19, 2007 ov no cough , sob, feeling great but no improvement on cxr   December 07, 2007 ov feeling great, minimal am cough not productive. No sob.   Opth eval, labs ok 10/2007   September 06, 2008 ov overall better over the last year, less tendency to exac on zmax and ethambutol. rec complete another year > satisfied improved 90% and stopped zmax and eth 08/2009     05/16/17 rec zmax daily but did not do  11/14/2017  f/u ov/Viraat Vanpatten re: obst bronchiectasis/ confused with details of care / taking zpak prn but also has omnicef and not sure what to do with it  Chief Complaint  Patient presents with  . Follow-up    no current problems   Dyspnea:  yardwork ok / MMRC1 = can walk nl pace, flat grade, can't hurry or go uphills or steps s sob   Cough: variable esp in am / using flutter and vest prn / mucus beige and about a tbsp in am s heme Sleeping:  Bed flat and one big pilow  SABA use: rarely needed  rec Plan A = Automatic = symbicort 160  And take zmax daily  Plan B = Backup for  breathing Only use your albuterol as a rescue medication Plan B = for mucus if Nastier than nl especially assoc with any fever >  omnicef 300 mg twice daily x 7days Plan C = Call me if needed and A and B aren't working well   cxr on return    08/27/2019  f/u ov/Janiyha Montufar re: bronchiectasis  obstructive  maint on symbicort 160 2bid and zmax daily  Chief Complaint  Patient presents with  . Follow-up    sob on exertion and productive cough with yellow sputum  Dyspnea: yardwork  Cough: some worse in am/ no blood on zmax one daily minimally dicolored  Sleeping: on flat bed with big pillow  SABA use: not using  02: none  rec I will check to see what the cheapest alternative is for symb 160  Ok to taper the pm dose symbicort off     02/26/2020  f/u ov/Raisa Ditto re: bronchiectasis / obst  dulera 200 / zpak daily  Chief Complaint  Patient presents with  . Follow-up    Breathing is doing well. She uses her albuterol inhaler 1-2 x per month.   Dyspnea:  yardwork ok until snow/ did shovel driveway Cough: none  Now  Sleeping: flat bed one pig pillow  SABA use: rarely 02: none    No obvious day to day or daytime variability or assoc excess/ purulent sputum or mucus plugs or hemoptysis or cp or chest tightness, subjective wheeze or overt sinus or hb symptoms.   Sleeping  without nocturnal    exacerbation  of respiratory  c/o's or need for noct saba. Also denies any obvious fluctuation of symptoms with weather or environmental changes or other aggravating or alleviating factors except as outlined above   No unusual exposure hx or h/o childhood pna/ asthma or knowledge of premature birth.  Current Allergies, Complete Past Medical History, Past Surgical History, Family History, and Social History were reviewed in [employer] record.  ROS  The following are not active complaints unless bolded Hoarseness, sore throat, dysphagia, dental problems, itching, sneezing,  nasal congestion or discharge of excess mucus or purulent secretions, ear ache,   fever, chills, sweats, unintended wt loss or wt gain, classically pleuritic or exertional cp,  orthopnea pnd or arm/hand swelling  or leg swelling, presyncope, palpitations, abdominal pain, anorexia, nausea, vomiting, diarrhea   or change in bowel habits or change in bladder habits, change in stools or change in urine, dysuria, hematuria,  rash, arthralgias, visual complaints, headache, numbness, weakness or ataxia or problems with walking or coordination,  change in mood or  memory.        Current Meds  Medication Sig  . acetaminophen (TYLENOL) 650 MG CR tablet every 8 (eight) hours as needed for pain.   Marland Kitchen albuterol (PROAIR HFA) 108 (90 Base) MCG/ACT inhaler Inhale 2 puffs into the lungs every 6 (six) hours as needed for wheezing or shortness of breath.  . ALPRAZolam (XANAX) 0.25 MG tablet TAKE 1 TABLET BY MOUTH AT BEDTIME AS NEEDED FOR SLEEP  . amitriptyline (ELAVIL) 50 MG tablet take 1 tablet by mouth at bedtime  . Apoaequorin (PREVAGEN EXTRA STRENGTH) 20 MG CAPS Take 1 capsule by mouth.  Marland Kitchen aspirin EC 81 MG tablet Take 81 mg by mouth daily.  Marland Kitchen atorvastatin (LIPITOR) 80 MG tablet TAKE 1 TABLET(80 MG) BY MOUTH DAILY  . azithromycin (ZITHROMAX) 250 MG tablet TAKE 1 TABLET(250 MG) BY MOUTH DAILY  . Dextromethorphan-Guaifenesin (MUCINEX DM) 30-600 MG TB12 Take 1 tablet by mouth daily. Patient takes 1200mg   . linaclotide (LINZESS) 145 MCG CAPS capsule Take 145 mcg by mouth daily before breakfast.  . Melatonin 5 MG TABS Take 5 mg by mouth at bedtime.  . metFORMIN (GLUCOPHAGE) 500 MG tablet TAKE 1 TABLET(500 MG) BY MOUTH TWICE DAILY WITH A MEAL  . mometasone-formoterol (DULERA) 200-5 MCG/ACT AERO Inhale 2 puffs into the lungs in the morning and at bedtime.  . nebivolol (BYSTOLIC) 2.5 MG tablet Take 1 tablet (2.5 mg total) by mouth daily.  . Probiotic Product (RESTORA) CAPS Take 1 capsule by mouth daily.  Marland Kitchen Respiratory Therapy Supplies (FLUTTER) DEVI Use as directed  . SYNTHROID 75 MCG tablet Take 1 tablet (75 mcg total) by mouth daily before breakfast.  . telmisartan (MICARDIS) 80 MG tablet TAKE 1 TABLET(80 MG) BY MOUTH DAILY                               Past Medical History:  Bronchiectasis see CT SE  04/13/00  - HFA 75% November 19, 2009  - alpha one screen 01/14/2015 >  MM  - IgE 01/14/2015 = 11  MAI  - Rx Zmax and ETH 08/29/07 > 08/2009 restarted empirically 05/31/14 > 09/03/14 (no change in cough so just use zpak for flares)  - Rx zmax maint  10/16/2015 >>> d/c 08/03/2016 > restarted 05/16/17 and clinically improved 08/02/2017 using prn omnicef to supplment  - Eye eval   10/09.......................[place] so try  cycles of cipro April 02, 2010  HEALTH MAINTENANCE...........................[name] - Td 10/2007  - Pneumovax 2005   and 10/29/2011 age 70, prevnar 08/16/2013  CAD  Hyperlipidemia  Hypertension  History of cough with ACE inhibition.  Gastroesophageal reflux disease         Objective:   Physical Exam   02/26/2020     116 08/27/2019     119 02/16/2019  124 08/15/2018  123  Wt 130 October 19, 2007>140 March 31, 2010 > 127 07/16/2010 > 10/14/2010  120 > 05/04/2011  117 > 07/30/2011  120 > 10/29/2011 118 > 130  05/01/2012 > 12/12/2012 132 >  08/14/13 137 >    02/20/2014  137 >  05/31/2014 134 > 09/03/2014    140 > 10/15/2014 137 > 01/14/2015 137 >  05/15/2015 123 >08/14/2015  124 >  10/16/2015 126 > 03/02/2016   124  > 05/04/2016  126 > 08/03/2016   130 > 02/14/2017  126 > 05/16/2017 128 > 08/02/2017  119 > 11/14/2017  116      Vital signs reviewed  02/26/2020  - Note at rest 02 sats  95% on RA   General appearance:    Pleasant elderly amb wf nad     HEENT : pt wearing mask not removed for exam due to covid - 19 concerns.    NECK :  without JVD/Nodes/TM/ nl carotid upstrokes bilaterally   LUNGS: no acc muscle use,  Mild barrel  contour chest wall with bilateral  Distant bs s audible wheeze and  without cough on insp or exp maneuvers  and mild  Hyperresonant  to  percussion bilaterally     CV:  RRR  no s3 or murmur or increase in P2, and no edema   ABD:  soft and nontender with pos end  insp Hoover's  in the supine position. No bruits or organomegaly appreciated, bowel sounds nl  MS:    Nl gait/  ext warm without deformities, calf tenderness, cyanosis or clubbing No obvious joint restrictions   SKIN: warm and dry without lesions    NEURO:  alert, approp, nl sensorium with  no motor or cerebellar deficits apparent.                   Assessment:

## 2020-02-26 NOTE — Assessment & Plan Note (Signed)
Onset of symptoms around 2002     - PFT's 10/14/2010  FEV1  1.25 (68%) and ratio 58% and DLCO 90%     - PFT's 10/29/2011  FEV1  1.33 (74%) and ratio 57 % and DLCO 93%    - PFTs 08/14/2013   FEV1  1.16 (60%) and ratio 61 with dlco 83%     - Flutter valve added 09/03/14      - alpha one   01/14/2015 >  MM, level 147     - IgE  01/14/15  11 - CT chest 04/17/15 Marked chronic bronchiectasis and volume loss in the right middle lobe with milder bronchiectasis, bronchial wall thickening, and nodular densities throughout the right upper and right lower lobe suggestive of chr onic endobronchial/atypical mycobacterial infection. - 10/16/2015 changed to symbicort 80 2bid (? Higher doses contributing to w MAI /freq of infections)   - 03/02/2016  After extensive coaching HFA effectiveness =    90%  - 05/04/2016 VEST stared around May 25 2016 - try off zmax 08/03/2016 > no change clinically as of 11/05/2016  - 02/14/2017 cycles of omnicef x 7 days prn purulent sputum  PFT's  05/16/2017  FEV1 0.89 (47 % ) ratio 60  p 12 % improvement from saba p nothing prior to study   - 05/16/2017  After extensive coaching inhaler device  effectiveness =    90%  - 05/16/17 quant Ig's ok x M slt low (not acutely ill at the time)  -zmax daily restarted 05/16/17 and clinically improved 08/02/2017 using prn omnicef to supplment but did not continue zmax as rec  - 11/14/2017  After extensive coaching inhaler device,  effectiveness =    90% baseline  - restart zmax daily 11/14/2017 > improved 02/14/2018   Adequate control on present rx, reviewed in detail with pt > no change in rx needed  = dulera 200 2bid and zmax 250 mg daily   Re saba:' I spent extra time with pt today reviewing appropriate use of albuterol for prn use on exertion with the following points: 1) saba is for relief of sob that does not improve by walking a slower pace or resting but rather if the pt does not improve after trying this first. 2) If the pt is convinced, as many  are, that saba helps recover from activity faster then it's easy to tell if this is the case by re-challenging : ie stop, take the inhaler, then p 5 minutes try the exact same activity (intensity of workload) that just caused the symptoms and see if they are substantially diminished or not after saba 3) if there is an activity that reproducibly causes the symptoms, try the saba 15 min before the activity on alternate days   If in fact the saba really does help, then fine to continue to use it prn but advised may need to look closer at the maintenance regimen being used to achieve better control of airways disease with exertion.    F/u q 6 m         Each maintenance medication was reviewed in detail including emphasizing most importantly the difference between maintenance and prns and under what circumstances the prns are to be triggered using an action plan format where appropriate.  Total time for H and P, chart review, counseling, reviewing hfa device(s) and generating customized AVS unique to this office visit / same day charting = 25 min

## 2020-03-04 DIAGNOSIS — H5032 Intermittent alternating esotropia: Secondary | ICD-10-CM | POA: Diagnosis not present

## 2020-03-05 ENCOUNTER — Other Ambulatory Visit: Payer: Self-pay | Admitting: Physician Assistant

## 2020-03-05 DIAGNOSIS — K5904 Chronic idiopathic constipation: Secondary | ICD-10-CM | POA: Diagnosis not present

## 2020-03-05 DIAGNOSIS — R160 Hepatomegaly, not elsewhere classified: Secondary | ICD-10-CM

## 2020-03-05 DIAGNOSIS — R1032 Left lower quadrant pain: Secondary | ICD-10-CM | POA: Diagnosis not present

## 2020-03-14 DIAGNOSIS — H5032 Intermittent alternating esotropia: Secondary | ICD-10-CM | POA: Diagnosis not present

## 2020-03-19 ENCOUNTER — Ambulatory Visit
Admission: RE | Admit: 2020-03-19 | Discharge: 2020-03-19 | Disposition: A | Discharge status: Home / Self Care | Payer: Medicare Other | Source: Ambulatory Visit | Attending: Physician Assistant | Admitting: Physician Assistant

## 2020-03-19 ENCOUNTER — Other Ambulatory Visit: Payer: Self-pay

## 2020-03-19 DIAGNOSIS — R160 Hepatomegaly, not elsewhere classified: Secondary | ICD-10-CM

## 2020-03-20 ENCOUNTER — Telehealth: Payer: Self-pay

## 2020-03-20 NOTE — Chronic Care Management (AMB) (Signed)
Chronic Care Management Pharmacy Assistant   Name: Belinda Day  MRN: [id number] DOB: December 12, 1945  Reason for Encounter: Patient Assistance Coordination  PCP : Belinda Olp, MD  Allergies:   Allergies  Allergen Reactions  . Bactrim [Sulfamethoxazole-Trimethoprim] Hives, Itching and Other (See Comments)    Bruised like areas on body  . Ciprofloxacin Other (See Comments)    Body aches  . Codeine Nausea Only    REACTION: nausea  . Erythromycin Nausea Only    REACTION: nausea  . Levofloxacin Other (See Comments)    REACTION: aches    Medications: Outpatient Encounter Medications as of 03/20/2020  Medication Sig  . acetaminophen (TYLENOL) 650 MG CR tablet every 8 (eight) hours as needed for pain.   Marland Kitchen albuterol (PROAIR HFA) 108 (90 Base) MCG/ACT inhaler Inhale 2 puffs into the lungs every 6 (six) hours as needed for wheezing or shortness of breath.  . ALPRAZolam (XANAX) 0.25 MG tablet TAKE 1 TABLET BY MOUTH AT BEDTIME AS NEEDED FOR SLEEP  . amitriptyline (ELAVIL) 50 MG tablet take 1 tablet by mouth at bedtime  . Apoaequorin (PREVAGEN EXTRA STRENGTH) 20 MG CAPS Take 1 capsule by mouth.  Marland Kitchen aspirin EC 81 MG tablet Take 81 mg by mouth daily.  Marland Kitchen atorvastatin (LIPITOR) 80 MG tablet TAKE 1 TABLET(80 MG) BY MOUTH DAILY  . azithromycin (ZITHROMAX) 250 MG tablet TAKE 1 TABLET(250 MG) BY MOUTH DAILY  . Dextromethorphan-Guaifenesin (MUCINEX DM) 30-600 MG TB12 Take 1 tablet by mouth daily. Patient takes 1200mg   . linaclotide (LINZESS) 145 MCG CAPS capsule Take 145 mcg by mouth daily before breakfast.  . Melatonin 5 MG TABS Take 5 mg by mouth at bedtime.  . metFORMIN (GLUCOPHAGE) 500 MG tablet TAKE 1 TABLET(500 MG) BY MOUTH TWICE DAILY WITH A MEAL  . mometasone-formoterol (DULERA) 200-5 MCG/ACT AERO Inhale 2 puffs into the lungs in the morning and at bedtime.  . nebivolol (BYSTOLIC) 2.5 MG tablet Take 1 tablet (2.5 mg total) by mouth daily.  . Probiotic Product (RESTORA) CAPS Take 1  capsule by mouth daily.  Marland Kitchen Respiratory Therapy Supplies (FLUTTER) DEVI Use as directed  . SYNTHROID 75 MCG tablet Take 1 tablet (75 mcg total) by mouth daily before breakfast.  . telmisartan (MICARDIS) 80 MG tablet TAKE 1 TABLET(80 MG) BY MOUTH DAILY   No facility-administered encounter medications on file as of 03/20/2020.    Current Diagnosis: Patient Active Problem List   Diagnosis Date Noted  . Senile purpura ([place]) 05/15/2019  . Insulin resistance 09/07/2017  . Cystitis 11/05/2016  . Cerebrovascular disease 01/31/2015  . Former smoker 08/05/2014  . Hyperglycemia 08/05/2014  . Constipation 05/02/2014  . History of colonic polyps 05/02/2014  . GERD (gastroesophageal reflux disease) 02/24/2014  . Hemoptysis 02/24/2014  . Benign paroxysmal positional vertigo 04/18/2013  . Insomnia 02/12/2013  . Diverticulitis 12/21/2012  . Nocturnal hypoxemia 12/12/2012  . Fibromyalgia 12/28/2011  . Osteopenia 08/14/2011  . Hypothyroidism 04/06/2010  . MAI (mycobacterium avium-intracellulare) ([place]) 11/19/2009  . Hyperlipemia 07/03/2007  . Essential hypertension 07/03/2007  . CAD (coronary artery disease) s/p CABG 07/03/2007  . Obstructive bronchiectasis ([place]) with GOLD II/III criteria 07/03/2007    I called patient assistance program to inquire on applications for medications  Bystolic and Dulera. Both applications were not sent to the program. I called and spoke with the patient she states she misplaced her applications and would like them mailed to her again. Will mail out both applications for the patient.  Belinda Day,  [facility] Clinical Pharmacist Assistant [phone]  Follow-Up:  Patient Assistance Coordination and Pharmacist Review

## 2020-03-21 ENCOUNTER — Telehealth: Payer: Self-pay

## 2020-03-21 NOTE — Progress Notes (Signed)
Called and left patient a message regarding resending patient assistant applications in the mail.   Informed patient to complete and return to office and to give me a call if she has any questions.  Georgiana Shore ,[facility] Pharmacist Assistant [phone]

## 2020-04-04 ENCOUNTER — Other Ambulatory Visit: Payer: Self-pay | Admitting: Physician Assistant

## 2020-04-04 ENCOUNTER — Ambulatory Visit
Admission: RE | Admit: 2020-04-04 | Discharge: 2020-04-04 | Disposition: A | Discharge status: Home / Self Care | Payer: Medicare Other | Source: Ambulatory Visit | Attending: Physician Assistant | Admitting: Physician Assistant

## 2020-04-04 DIAGNOSIS — R1032 Left lower quadrant pain: Secondary | ICD-10-CM

## 2020-04-04 DIAGNOSIS — Z86010 Personal history of colonic polyps: Secondary | ICD-10-CM | POA: Diagnosis not present

## 2020-04-04 DIAGNOSIS — K5792 Diverticulitis of intestine, part unspecified, without perforation or abscess without bleeding: Secondary | ICD-10-CM

## 2020-04-04 DIAGNOSIS — Z800 Family history of malignant neoplasm of digestive organs: Secondary | ICD-10-CM | POA: Diagnosis not present

## 2020-04-04 DIAGNOSIS — K5904 Chronic idiopathic constipation: Secondary | ICD-10-CM | POA: Diagnosis not present

## 2020-04-04 DIAGNOSIS — K5730 Diverticulosis of large intestine without perforation or abscess without bleeding: Secondary | ICD-10-CM | POA: Diagnosis not present

## 2020-04-04 MED ORDER — IOPAMIDOL (ISOVUE-300) INJECTION 61%
100.0000 mL | Freq: Once | INTRAVENOUS | Status: AC | PRN
  Administered 2020-04-04: 100 mL via INTRAVENOUS

## 2020-04-16 DIAGNOSIS — L821 Other seborrheic keratosis: Secondary | ICD-10-CM | POA: Diagnosis not present

## 2020-04-16 DIAGNOSIS — R21 Rash and other nonspecific skin eruption: Secondary | ICD-10-CM | POA: Diagnosis not present

## 2020-04-16 DIAGNOSIS — L570 Actinic keratosis: Secondary | ICD-10-CM | POA: Diagnosis not present

## 2020-04-16 DIAGNOSIS — Z85828 Personal history of other malignant neoplasm of skin: Secondary | ICD-10-CM | POA: Diagnosis not present

## 2020-04-16 DIAGNOSIS — L400 Psoriasis vulgaris: Secondary | ICD-10-CM | POA: Diagnosis not present

## 2020-04-16 DIAGNOSIS — L718 Other rosacea: Secondary | ICD-10-CM | POA: Diagnosis not present

## 2020-04-16 DIAGNOSIS — D1801 Hemangioma of skin and subcutaneous tissue: Secondary | ICD-10-CM | POA: Diagnosis not present

## 2020-04-18 DIAGNOSIS — K5792 Diverticulitis of intestine, part unspecified, without perforation or abscess without bleeding: Secondary | ICD-10-CM | POA: Diagnosis not present

## 2020-04-18 DIAGNOSIS — Z800 Family history of malignant neoplasm of digestive organs: Secondary | ICD-10-CM | POA: Diagnosis not present

## 2020-04-18 DIAGNOSIS — Z86010 Personal history of colonic polyps: Secondary | ICD-10-CM | POA: Diagnosis not present

## 2020-04-18 DIAGNOSIS — K5904 Chronic idiopathic constipation: Secondary | ICD-10-CM | POA: Diagnosis not present

## 2020-05-18 ENCOUNTER — Other Ambulatory Visit: Payer: Self-pay | Admitting: Internal Medicine

## 2020-05-19 DIAGNOSIS — R1032 Left lower quadrant pain: Secondary | ICD-10-CM | POA: Diagnosis not present

## 2020-05-19 DIAGNOSIS — K5792 Diverticulitis of intestine, part unspecified, without perforation or abscess without bleeding: Secondary | ICD-10-CM | POA: Diagnosis not present

## 2020-05-19 DIAGNOSIS — K5904 Chronic idiopathic constipation: Secondary | ICD-10-CM | POA: Diagnosis not present

## 2020-05-19 MED ORDER — NEBIVOLOL HCL 2.5 MG PO TABS: 2.5000 mg | ORAL_TABLET | Freq: Every day | ORAL | 2 refills | Status: DC

## 2020-05-22 NOTE — Patient Instructions (Incomplete)
Please stop by lab before you go If you have mychart- we will send your results within 3 business days of [place] receiving them.  If you do not have mychart- we will call you about results within 5 business days of [place] receiving them.  *please also note that you will see labs on mychart as soon as they post. I will later go in and write notes on them- will say "notes from Dr. Yong Channel"  Depression screen [facility] 2/9 05/08/2019 04/16/2019 09/08/2018  Decreased Interest 0 0 0  Down, Depressed, Hopeless 0 0 0  PHQ - 2 Score 0 0 0  Some recent data might be hidden

## 2020-05-22 NOTE — Progress Notes (Deleted)
Phone [phone]   Subjective:  Patient presents today for their annual physical. Chief complaint-noted.   See problem oriented charting- ROS- full  review of systems was completed and negative except for: ***  The following were reviewed and entered/updated in epic: Past Medical History:  Diagnosis Date  . Bronchiectasis    oxygen at night in the past  . CAD (coronary artery disease)   . Diverticulitis 2014  . GERD (gastroesophageal reflux disease)   . History of shingles 04/2012  . HTN (hypertension)   . Hyperlipidemia   . Hypothyroidism   . MAI (mycobacterium avium-intracellulare) ([place])   . Neuritis of upper extremity    Patient Active Problem List   Diagnosis Date Noted  . Senile purpura ([place]) 05/15/2019  . Insulin resistance 09/07/2017  . Cystitis 11/05/2016  . Cerebrovascular disease 01/31/2015  . Former smoker 08/05/2014  . Hyperglycemia 08/05/2014  . Constipation 05/02/2014  . History of colonic polyps 05/02/2014  . GERD (gastroesophageal reflux disease) 02/24/2014  . Hemoptysis 02/24/2014  . Benign paroxysmal positional vertigo 04/18/2013  . Insomnia 02/12/2013  . Diverticulitis 12/21/2012  . Nocturnal hypoxemia 12/12/2012  . Fibromyalgia 12/28/2011  . Osteopenia 08/14/2011  . Hypothyroidism 04/06/2010  . MAI (mycobacterium avium-intracellulare) ([place]) 11/19/2009  . Hyperlipemia 07/03/2007  . Essential hypertension 07/03/2007  . CAD (coronary artery disease) s/p CABG 07/03/2007  . Obstructive bronchiectasis ([place]) with GOLD II/III criteria 07/03/2007   Past Surgical History:  Procedure Laterality Date  . CORONARY ARTERY BYPASS GRAFT  2004   x3 CABG    Family History  Problem Relation Age of Onset  . Colon cancer Mother   . Hyperlipidemia Mother   . Hypertension Mother   . Heart disease Mother   . Stroke Mother   . Diabetes Mother   . Colon cancer Father   . Arthritis Father   . Hyperlipidemia Father   . Hypertension Father   . Heart disease  Father   . Stroke Father   . Atopy Neg Hx     Medications- reviewed and updated Current Outpatient Medications  Medication Sig Dispense Refill  . acetaminophen (TYLENOL) 650 MG CR tablet every 8 (eight) hours as needed for pain.     Marland Kitchen albuterol (PROAIR HFA) 108 (90 Base) MCG/ACT inhaler Inhale 2 puffs into the lungs every 6 (six) hours as needed for wheezing or shortness of breath. 1 each 1  . ALPRAZolam (XANAX) 0.25 MG tablet TAKE 1 TABLET BY MOUTH AT BEDTIME AS NEEDED FOR SLEEP 90 tablet 1  . amitriptyline (ELAVIL) 50 MG tablet take 1 tablet by mouth at bedtime 90 tablet 3  . Apoaequorin (PREVAGEN EXTRA STRENGTH) 20 MG CAPS Take 1 capsule by mouth.    Marland Kitchen aspirin EC 81 MG tablet Take 81 mg by mouth daily.    Marland Kitchen atorvastatin (LIPITOR) 80 MG tablet TAKE 1 TABLET(80 MG) BY MOUTH DAILY 90 tablet 3  . azithromycin (ZITHROMAX) 250 MG tablet TAKE 1 TABLET(250 MG) BY MOUTH DAILY 30 tablet 5  . Dextromethorphan-Guaifenesin (MUCINEX DM) 30-600 MG TB12 Take 1 tablet by mouth daily. Patient takes 1200mg     . DULERA 200-5 MCG/ACT AERO INHALE 2 PUFFS INTO THE LUNGS EVERY MORNING AND EVERY NIGHT AT BEDTIME 13 g 6  . linaclotide (LINZESS) 145 MCG CAPS capsule Take 145 mcg by mouth daily before breakfast.    . Melatonin 5 MG TABS Take 5 mg by mouth at bedtime.    . metFORMIN (GLUCOPHAGE) 500 MG tablet TAKE 1 TABLET(500 MG) BY MOUTH TWICE  DAILY WITH A MEAL 180 tablet 3  . nebivolol (BYSTOLIC) 2.5 MG tablet Take 1 tablet (2.5 mg total) by mouth daily. 90 tablet 2  . Probiotic Product (RESTORA) CAPS Take 1 capsule by mouth daily.    Marland Kitchen Respiratory Therapy Supplies (FLUTTER) DEVI Use as directed 1 each 0  . SYNTHROID 75 MCG tablet Take 1 tablet (75 mcg total) by mouth daily before breakfast. 90 tablet 3  . telmisartan (MICARDIS) 80 MG tablet TAKE 1 TABLET(80 MG) BY MOUTH DAILY 90 tablet 3   No current facility-administered medications for this visit.    Allergies-reviewed and updated Allergies  Allergen  Reactions  . Bactrim [Sulfamethoxazole-Trimethoprim] Hives, Itching and Other (See Comments)    Bruised like areas on body  . Ciprofloxacin Other (See Comments)    Body aches  . Codeine Nausea Only    REACTION: nausea  . Erythromycin Nausea Only    REACTION: nausea  . Levofloxacin Other (See Comments)    REACTION: aches    Social History   Social History Narrative   Family: Single never married, no children, cat and rehabs turtles and tortoise   Went to [employer] in [place] and social work, some business courses at [employer], EMT for 6 years.    LIves alone. Completely independent.    Lives in retirement community.       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 Gen: NAD, resting comfortably HEENT: Mucous membranes are moist. Oropharynx normal Neck: no thyromegaly CV: RRR no murmurs rubs or gallops Lungs: CTAB no crackles, wheeze, rhonchi Abdomen: soft/nontender/nondistended/normal bowel sounds. No rebound or guarding.  Ext: no edema Skin: warm, dry Neuro: grossly normal, moves all extremities, PERRLA***   Assessment and Plan   75 y.o. female presenting for annual physical.  Health Maintenance counseling: 1. Anticipatory guidance: Patient counseled regarding regular dental exams ***q6 months, eye exams ***,  avoiding smoking and second hand smoke*** , limiting alcohol to 1 beverage per day*** .   2. Risk factor reduction:  Advised patient of need for regular exercise and diet rich and fruits and vegetables to reduce risk of heart attack and stroke. Exercise- ***. Diet-***.  Wt Readings from Last 3 Encounters:  02/26/20 116 lb (52.6 kg)  01/28/20 117 lb (53.1 kg)  11/27/19 120 lb 6.4 oz (54.6 kg)   3. Immunizations/screenings/ancillary studies Immunization History  Administered Date(s) Administered   . H1N1 01/02/2008  . Influenza Split 10/14/2010, 10/07/2011  . Influenza, High Dose Seasonal PF 11/05/2016  . Influenza,inj,Quad PF,6+ Mos 10/02/2012, 10/16/2015, 11/07/2017, 10/05/2018, 10/30/2019  . Influenza-Unspecified 09/25/2013, 10/15/2014, 11/07/2017  . Moderna Sars-Covid-2 Vaccination 02/09/2019, 03/12/2019, 09/05/2019  . Pneumococcal Conjugate-13 08/14/2013  . Pneumococcal Polysaccharide-23 10/29/2011  . Td 05/15/2019  . Zoster Recombinat (Shingrix) 06/25/2017, 09/30/2017   There are no preventive care reminders to display for this patient. 4. Cervical cancer screening- *** 5. Breast cancer screening-  breast exam *** and mammogram *** 6. Colon cancer screening - *** 7. Skin cancer screening- ***advised regular sunscreen use. Denies worrisome, changing, or new skin lesions.  8. Birth control/STD check- *** 9. Osteoporosis screening at 1- *** -FORMER smoker  Status of chronic or acute concerns   ***awv 05/08/19 ***cpe 05/15/19  #Fibromyalgia/insomnia S: Compliant with amitriptyline 50 mg (had worsening symptoms on lower dose in past by prior PCP)-due to age, her  pharmacist has mentioned coming off amitriptyline but she has been on since 1988 and prefers to continue.  Uses Tylenol as needed. -Uses Xanax to help with sleep.  Very difficult to fall asleep if she does not take this.  Fibromyalgia symptoms worsen with poor sleep A/P: ***   % Pulmonary-MAI and obstructive bronchiectasis-follows with Dr. Melvyn Novas S: Patient follows with pulmonary clinic.  On regular Dulera.  Albuterol as needed.  Uses azithromycin daily A/P: ***   % #CAD status post CABG-follows with Dr. Jones Broom S: Compliant with atorvastatin 80 mg and aspirin 81 mg.  LDL goal under 70 A/P: ***    #Hypertension S: Compliant with Bystolic 2.5 mg, telmisartan 75mt A/P: ***   #Hypothyroidism S: Compliant with Synthroid 75 mcg A/P: ***     #Hyperglycemia/prediabetes S: Compliant with metformin  500 mg twice a day. A/P: ***    #Osteopenia-technically osteoporosis since has been on Fosamax in the past S: Takes calcium and vitamin D alone and all areas were stable-"02/16/17 DEXA. -2.2 at R femur neck-23% 10 year risk and hip fracture risk 12%.".  Numbers improved by 0.1 on femur necks in 2021 A/P: ***     #Constipation- recently doing ok on milk of magnesium. In past Patient compliant with sparing Linzess- was costly ***    #senile purpura- easy bruising on aspirin   # hand arthritis- may end up with hand surgery- wearing braces at times. Dr. Amedeo Plenty ***   *** 2. The liver is likely enlarged. This could be further assessed with nonemergent outpatient abdominal ultrasound. 02/09/20 [facility]- there for constipation *** No diagnosis found.  Recommended follow up: ***No follow-ups on file. Future Appointments  Date Time Provider [place]  05/27/2020  8:00 AM Marin Olp, MD [facility]  08/25/2020  9:30 AM Tanda Rockers, MD [facility] None  09/30/2020  1:30 PM [facility] PHARMACIST [facility]    No chief complaint on file.  Lab/Order associations:*** fasting No diagnosis found.  No orders of the defined types were placed in this encounter.   Return precautions advised.  Clyde Lundborg, CMA

## 2020-05-27 ENCOUNTER — Encounter: Payer: Medicare Other | Admitting: Family Medicine

## 2020-05-27 DIAGNOSIS — E785 Hyperlipidemia, unspecified: Secondary | ICD-10-CM

## 2020-05-27 DIAGNOSIS — R739 Hyperglycemia, unspecified: Secondary | ICD-10-CM

## 2020-05-27 DIAGNOSIS — E039 Hypothyroidism, unspecified: Secondary | ICD-10-CM

## 2020-05-27 DIAGNOSIS — Z0000 Encounter for general adult medical examination without abnormal findings: Secondary | ICD-10-CM

## 2020-05-27 DIAGNOSIS — I10 Essential (primary) hypertension: Secondary | ICD-10-CM

## 2020-05-27 DIAGNOSIS — I2510 Atherosclerotic heart disease of native coronary artery without angina pectoris: Secondary | ICD-10-CM

## 2020-05-27 DIAGNOSIS — Z87891 Personal history of nicotine dependence: Secondary | ICD-10-CM

## 2020-05-27 NOTE — Progress Notes (Signed)
Phone [phone]   Subjective:  Patient presents today for their annual physical. Chief complaint-noted.   See problem oriented charting- ROS- full  review of systems was completed and negative except for: cough and wheezing with bronchiectasis, constipation, recent diverticulitis treatment, hand joint pain - uses braces as needed, bruising easily with aspirin  The following were reviewed and entered/updated in epic: Past Medical History:  Diagnosis Date  . Bronchiectasis    oxygen at night in the past  . CAD (coronary artery disease)   . Diverticulitis 2014  . GERD (gastroesophageal reflux disease)   . History of shingles 04/2012  . HTN (hypertension)   . Hyperlipidemia   . Hypothyroidism   . MAI (mycobacterium avium-intracellulare) ([place])   . Neuritis of upper extremity    Patient Active Problem List   Diagnosis Date Noted  . Osteopenia 08/14/2011    Priority: High  . CAD (coronary artery disease) s/p CABG 07/03/2007    Priority: High  . Insulin resistance 09/07/2017    Priority: Medium  . Hyperglycemia 08/05/2014    Priority: Medium  . Insomnia 02/12/2013    Priority: Medium  . Nocturnal hypoxemia 12/12/2012    Priority: Medium  . Fibromyalgia 12/28/2011    Priority: Medium  . Hypothyroidism 04/06/2010    Priority: Medium  . MAI (mycobacterium avium-intracellulare) ([place]) 11/19/2009    Priority: Medium  . Hyperlipemia 07/03/2007    Priority: Medium  . Essential hypertension 07/03/2007    Priority: Medium  . Obstructive bronchiectasis ([place]) with GOLD II/III criteria 07/03/2007    Priority: Medium  . Senile purpura ([place]) 05/15/2019    Priority: Low  . Cystitis 11/05/2016    Priority: Low  . Former smoker 08/05/2014    Priority: Low  . Constipation 05/02/2014    Priority: Low  . History of colonic polyps 05/02/2014    Priority: Low  . GERD (gastroesophageal reflux disease) 02/24/2014    Priority: Low  . Hemoptysis 02/24/2014    Priority: Low  . Benign  paroxysmal positional vertigo 04/18/2013    Priority: Low  . Diverticulitis 12/21/2012    Priority: Low  . Aortic atherosclerosis ([place]) 05/28/2020  . Cerebrovascular disease 01/31/2015   Past Surgical History:  Procedure Laterality Date  . CORONARY ARTERY BYPASS GRAFT  2004   x3 CABG    Family History  Problem Relation Age of Onset  . Colon cancer Mother   . Hyperlipidemia Mother   . Hypertension Mother   . Heart disease Mother   . Stroke Mother   . Diabetes Mother   . Colon cancer Father   . Arthritis Father   . Hyperlipidemia Father   . Hypertension Father   . Heart disease Father   . Stroke Father   . Atopy Neg Hx     Medications- reviewed and updated Current Outpatient Medications  Medication Sig Dispense Refill  . acetaminophen (TYLENOL) 650 MG CR tablet every 8 (eight) hours as needed for pain.     Marland Kitchen albuterol (PROAIR HFA) 108 (90 Base) MCG/ACT inhaler Inhale 2 puffs into the lungs every 6 (six) hours as needed for wheezing or shortness of breath. 1 each 1  . amitriptyline (ELAVIL) 50 MG tablet take 1 tablet by mouth at bedtime 90 tablet 3  . aspirin EC 81 MG tablet Take 81 mg by mouth daily.    Marland Kitchen atorvastatin (LIPITOR) 80 MG tablet TAKE 1 TABLET(80 MG) BY MOUTH DAILY 90 tablet 3  . azithromycin (ZITHROMAX) 250 MG tablet TAKE 1 TABLET(250 MG)  BY MOUTH DAILY 30 tablet 5  . Dextromethorphan-Guaifenesin (MUCINEX DM) 30-600 MG TB12 Take 1 tablet by mouth daily. Patient takes 1200mg     . DULERA 200-5 MCG/ACT AERO INHALE 2 PUFFS INTO THE LUNGS EVERY MORNING AND EVERY NIGHT AT BEDTIME 13 g 6  . Melatonin 5 MG TABS Take 5 mg by mouth at bedtime.    . metFORMIN (GLUCOPHAGE) 500 MG tablet TAKE 1 TABLET(500 MG) BY MOUTH TWICE DAILY WITH A MEAL 180 tablet 3  . nebivolol (BYSTOLIC) 2.5 MG tablet Take 1 tablet (2.5 mg total) by mouth daily. 90 tablet 2  . Probiotic Product (RESTORA) CAPS Take 1 capsule by mouth daily.    Marland Kitchen Respiratory Therapy Supplies (FLUTTER) DEVI Use as  directed 1 each 0  . telmisartan (MICARDIS) 80 MG tablet TAKE 1 TABLET(80 MG) BY MOUTH DAILY 90 tablet 3  . ALPRAZolam (XANAX) 0.25 MG tablet TAKE 1 TABLET BY MOUTH AT BEDTIME AS NEEDED FOR SLEEP 90 tablet 1  . Apoaequorin (PREVAGEN EXTRA STRENGTH) 20 MG CAPS Take 1 capsule by mouth.    . linaclotide (LINZESS) 145 MCG CAPS capsule Take 145 mcg by mouth daily before breakfast. (Patient not taking: Reported on 05/28/2020)    . SYNTHROID 75 MCG tablet Take 1 tablet (75 mcg total) by mouth daily before breakfast. 90 tablet 3   No current facility-administered medications for this visit.    Allergies-reviewed and updated Allergies  Allergen Reactions  . Bactrim [Sulfamethoxazole-Trimethoprim] Hives, Itching and Other (See Comments)    Bruised like areas on body  . Ciprofloxacin Other (See Comments)    Body aches  . Codeine Nausea Only    REACTION: nausea  . Erythromycin Nausea Only    REACTION: nausea  . Levofloxacin Other (See Comments)    REACTION: aches    Social History   Social History Narrative   Family: Single never married, no children, cat and rehabs turtles and tortoise   Went to [employer] in [place] and social work, some business courses at [employer], EMT for 6 years.    LIves alone. Completely independent.    Lives in retirement community.       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 Oropharynx normal Neck: no thyromegaly CV: RRR no murmurs rubs or gallops Lungs: CTAB no crackles, wheeze, rhonchi Abdomen: soft/nontender except for mild tenderness in LLQ (following with GI and plans to follow up if worsens)/nondistended/normal bowel  sounds. No rebound or guarding.  Ext: no edema Skin: warm, dry Neuro: grossly normal, moves all extremities, PERRLA   Assessment and Plan   75 y.o. female presenting for annual physical.  Health Maintenance counseling: 1. Anticipatory guidance: Patient counseled regarding regular dental exams -q6 months, eye exams -,  avoiding smoking and second hand smoke , limiting alcohol to 1 beverage per day .   2. Risk factor reduction:  Advised patient of need for regular exercise and diet rich and fruits and vegetables to reduce risk of heart attack and stroke. Exercise- staying active with gardening- on a daily basis. Diet-limits sugars due to prediabetes. Weight down from diverticulitis-  do not want her to lose weight- prefer for her to gain again - id be ok with 5 or 10 lbs weight gain.  Wt Readings from Last 3 Encounters:  05/28/20 110 lb 3.2 oz (50 kg)  02/26/20 116 lb (52.6 kg)  01/28/20 117 lb (53.1 kg)  3. Immunizations/screenings/ancillary studies-discussed Prevnar 20- wants to hat with Dr. Melvyn Novas - had covid 71 #4 Immunization History  Administered Date(s) Administered  . H1N1 01/02/2008  . Influenza Split 10/14/2010, 10/07/2011  . Influenza, High Dose Seasonal PF 11/05/2016  . Influenza,inj,Quad PF,6+ Mos 10/02/2012, 10/16/2015, 11/07/2017, 10/05/2018, 10/30/2019  . Influenza-Unspecified 09/25/2013, 10/15/2014, 11/07/2017  . Moderna Sars-Covid-2 Vaccination 02/09/2019, 03/12/2019, 09/05/2019  . Pneumococcal Conjugate-13 08/14/2013  . Pneumococcal Polysaccharide-23 10/29/2011  . Td 05/15/2019  . Zoster Recombinat (Shingrix) 06/25/2017, 09/30/2017   4. Cervical cancer screening- past age based screening- never had abnormal pap 5. Breast cancer screening-  breast exam with GYN and mammogram with [facility] 01/08/20 6. Colon cancer screening - 08/11/2018 with [facility] - started with [facility]- 2023 planned again with [facility] and depending on results may or may not need to go back to [facility] 7. Skin  cancer screening- follows with dermatology yearly. advised regular sunscreen use. Denies worrisome, changing, or new skin lesions.  8. Birth control/STD check- not sexually active 9. Osteoporosis screening at 55- see below. Calcium/vit d/weight bearing exercise- maingly gardening including lifting rocks -FORMER smoker- quit over 30 years. No regular screening needed  Status of chronic or acute concerns   #Fibromyalgia/insomnia S: Compliant with amitriptyline 50 mg (had worsening symptoms on lower dose in past by prior PCP)-due to age, her pharmacist has mentioned coming off amitriptyline but she has been on since 1988 and prefers to continue.  Uses Tylenol as needed. -Uses Xanax to help with sleep.  Very difficult to fall asleep if she does not take this.  Fibromyalgia symptoms worsen with poor sleep A/P: Reasonable control at present-continue current prescriptions-refill of Xanax for insomnia provided today  % Pulmonary-MAI and obstructive bronchiectasis-follows with Dr. Melvyn Novas S: Patient follows with pulmonary clinic.  On regular Dulera.  Albuterol as needed.  Uses azithromycin daily A/P: Reasonably stable- continue current medications and pulmonary follow-up.  Discussed COVID-19 vaccination #4 especially with underlying pulmonary disease- she already had this  % #CAD status post CABG-follows with Dr. Jones Broom #Aortic atherosclerosis noted on CT S: Compliant with atorvastatin 80 mg and aspirin 81 mg.  LDL goal under 70. No chest pain. SOB related to bronchiectasis Lab Results  Component Value Date   CHOL 123 05/08/2019   HDL 53.70 05/08/2019   LDLCALC 57 05/08/2019   LDLDIRECT 47 11/27/2019   TRIG 62.0 05/08/2019   CHOLHDL 2 05/08/2019   A/P: CAD asymptomatic though is limited by pulmonary issues. -Lipids stable on last check-likely continue current medications.-Update lipids today - For aortic atherosclerosis continue risk factor modification  #Hypertension S:  Compliant with Bystolic 2.5 mg, telmisartan 80mg  BP Readings from Last 3 Encounters:  05/28/20 116/60  02/26/20 132/64  01/28/20 134/82   A/P:   Stable. Continue current medications.    #Hypothyroidism S: Compliant with Synthroid 75 mcg Lab Results  Component Value Date   TSH 1.00 11/27/2019  A/P: Hopefully stable-refilled medication today-plan to continue current dose unless levels are off today  #Hyperglycemia/prediabetes S: Compliant with metformin 500 mg twice a day. Does take glucerna Lab Results  Component Value Date   HGBA1C 5.9 (H) 11/27/2019   HGBA1C 6.1 05/08/2019   HGBA1C 5.5 04/16/2019  A/P: Slightly improved on last check-update again with labs today   #Osteopenia-technically osteoporosis since has been on Fosamax in the past S: Takes calcium and vitamin D alone and all areas were stable-"02/16/17 DEXA. -2.2 at R femur neck-23% 10 year risk and hip fracture risk 12%.".  Numbers improved by 0.1 on femur necks in 2021 A/P: Since largely stable at last check also remain off of medicine-continue to recheck every 2 to 3 years   #Constipation- recently doing ok on milk of magnesium- some gagging. In past Patient compliant with sparing Linzess- was costly  And may have been contributing to side pain   #senile purpura- easy bruising on aspirin.  Stable, continue to monitor.  Update CBC today  # hand arthritis- may end up with hand surgery- wearing braces at times. Dr. Amedeo Plenty .  Reports stable recently plus wants to hold off with pandemic  #abdominal pain- Of note when patient was seen at [facility] in January had enlarged liver.  Recommendation was for follow-up ultrasound-ultrasound was completed and showed no hepatomegaly or other abnormalities.  She also had a CT abdomen pelvis through GI-Follow-up CT showed-noncomplicated distal descending diverticulitis, motion degradation and therefore poor quality.  Did note aortic atherosclerosis.  This was done through [facility] - for  diverticulitis she was on Augmentin for 20 days and then had to go on metronidazole and cefuroxime for an additional 10 days.  Then had to start on restora rx to get gut flora back.   Recommended follow up: Return in about 6 months (around 11/28/2020). Future Appointments  Date Time Provider [place]  08/25/2020  9:30 AM Tanda Rockers, MD [facility] None  09/30/2020  1:30 PM [facility]CM PHARMACIST [facility]   Lab/Order associations: Not fasting- Glucerna 5.5 hours ago with meds   ICD-10-CM   1. Preventative health care  Z00.00   2. Essential hypertension  I10   3. Coronary artery disease due to lipid rich plaque  I25.10    I25.83   4. Hypothyroidism, unspecified type  E03.9   5. Hyperlipidemia, unspecified hyperlipidemia type  E78.5   6. Hyperglycemia  R73.9   7. Former smoker  Z87.891   57. Aortic atherosclerosis (HCC)  I70.0     Meds ordered this encounter  Medications  . ALPRAZolam (XANAX) 0.25 MG tablet    Sig: TAKE 1 TABLET BY MOUTH AT BEDTIME AS NEEDED FOR SLEEP    Dispense:  90 tablet    Refill:  1  . SYNTHROID 75 MCG tablet    Sig: Take 1 tablet (75 mcg total) by mouth daily before breakfast.    Dispense:  90 tablet    Refill:  3    Return precautions advised.  Garret Reddish, MD

## 2020-05-27 NOTE — Patient Instructions (Addendum)
Please stop by lab before you go If you have mychart- we will send your results within 3 business days of [place] receiving them.  If you do not have mychart- we will call you about results within 5 business days of [place] receiving them.  *please also note that you will see labs on mychart as soon as they post. I will later go in and write notes on them- will say "notes from Dr. Yong Channel"  No changes today unless labs lead [place] to make changes  Double check with Dr. Melvyn Novas about prevnar 20- we can give next visit if they do not give (as long as he is ok with it)   You are eligible to schedule your annual wellness visit with our nurse specialist Otila Kluver.  Please consider scheduling this before you leave today  Recommended follow up: Return in about 6 months (around 11/28/2020) for follow up- or sooner if needed.

## 2020-05-28 ENCOUNTER — Encounter: Payer: Self-pay | Admitting: Family Medicine

## 2020-05-28 ENCOUNTER — Ambulatory Visit (INDEPENDENT_AMBULATORY_CARE_PROVIDER_SITE_OTHER): Payer: Medicare Other | Admitting: Family Medicine

## 2020-05-28 ENCOUNTER — Other Ambulatory Visit: Payer: Self-pay

## 2020-05-28 VITALS — BP 116/60 | HR 72 | Temp 97.8°F | Ht 61.0 in | Wt 110.2 lb

## 2020-05-28 DIAGNOSIS — I2510 Atherosclerotic heart disease of native coronary artery without angina pectoris: Secondary | ICD-10-CM

## 2020-05-28 DIAGNOSIS — I10 Essential (primary) hypertension: Secondary | ICD-10-CM

## 2020-05-28 DIAGNOSIS — E039 Hypothyroidism, unspecified: Secondary | ICD-10-CM

## 2020-05-28 DIAGNOSIS — Z0000 Encounter for general adult medical examination without abnormal findings: Secondary | ICD-10-CM

## 2020-05-28 DIAGNOSIS — E785 Hyperlipidemia, unspecified: Secondary | ICD-10-CM

## 2020-05-28 DIAGNOSIS — D692 Other nonthrombocytopenic purpura: Secondary | ICD-10-CM

## 2020-05-28 DIAGNOSIS — R739 Hyperglycemia, unspecified: Secondary | ICD-10-CM | POA: Diagnosis not present

## 2020-05-28 DIAGNOSIS — I2583 Coronary atherosclerosis due to lipid rich plaque: Secondary | ICD-10-CM | POA: Diagnosis not present

## 2020-05-28 DIAGNOSIS — I700 Atherosclerosis of aorta: Secondary | ICD-10-CM

## 2020-05-28 DIAGNOSIS — Z87891 Personal history of nicotine dependence: Secondary | ICD-10-CM

## 2020-05-28 LAB — CBC WITH DIFFERENTIAL/PLATELET
Basophils Absolute: 0 10*3/uL (ref 0.0–0.1)
Basophils Relative: 0.6 % (ref 0.0–3.0)
Eosinophils Absolute: 0 10*3/uL (ref 0.0–0.7)
Eosinophils Relative: 0.5 % (ref 0.0–5.0)
HCT: 40.8 % (ref 36.0–46.0)
Hemoglobin: 13.5 g/dL (ref 12.0–15.0)
Lymphocytes Relative: 18.4 % (ref 12.0–46.0)
Lymphs Abs: 1.4 10*3/uL (ref 0.7–4.0)
MCHC: 33 g/dL (ref 30.0–36.0)
MCV: 95.2 fl (ref 78.0–100.0)
Monocytes Absolute: 0.4 10*3/uL (ref 0.1–1.0)
Monocytes Relative: 5 % (ref 3.0–12.0)
Neutro Abs: 5.9 10*3/uL (ref 1.4–7.7)
Neutrophils Relative %: 75.5 % (ref 43.0–77.0)
Platelets: 308 10*3/uL (ref 150.0–400.0)
RBC: 4.29 Mil/uL (ref 3.87–5.11)
RDW: 13.7 % (ref 11.5–15.5)
WBC: 7.8 10*3/uL (ref 4.0–10.5)

## 2020-05-28 LAB — COMPREHENSIVE METABOLIC PANEL
ALT: 19 U/L (ref 0–35)
AST: 26 U/L (ref 0–37)
Albumin: 4.4 g/dL (ref 3.5–5.2)
Alkaline Phosphatase: 88 U/L (ref 39–117)
BUN: 20 mg/dL (ref 6–23)
CO2: 28 mEq/L (ref 19–32)
Calcium: 9.7 mg/dL (ref 8.4–10.5)
Chloride: 103 mEq/L (ref 96–112)
Creatinine, Ser: 0.81 mg/dL (ref 0.40–1.20)
GFR: 70.14 mL/min (ref >60.00)
Glucose, Bld: 87 mg/dL (ref 70–99)
Potassium: 4.1 mEq/L (ref 3.5–5.1)
Sodium: 140 mEq/L (ref 135–145)
Total Bilirubin: 0.4 mg/dL (ref 0.2–1.2)
Total Protein: 6.6 g/dL (ref 6.0–8.3)

## 2020-05-28 LAB — POC URINALSYSI DIPSTICK (AUTOMATED)
Blood, UA: NEGATIVE
Glucose, UA: NEGATIVE
Ketones, UA: NEGATIVE
Leukocytes, UA: NEGATIVE
Nitrite, UA: NEGATIVE
Protein, UA: NEGATIVE
Spec Grav, UA: 1.025 (ref 1.010–1.025)
Urobilinogen, UA: 0.2 E.U./dL
pH, UA: 6 (ref 5.0–8.0)

## 2020-05-28 LAB — TSH: TSH: 0.55 u[IU]/mL (ref 0.35–4.50)

## 2020-05-28 LAB — LIPID PANEL
Cholesterol: 147 mg/dL (ref 0–200)
HDL: 65 mg/dL (ref >39.00)
LDL Cholesterol: 67 mg/dL (ref 0–99)
NonHDL: 82.06
Total CHOL/HDL Ratio: 2
Triglycerides: 76 mg/dL (ref 0.0–149.0)
VLDL: 15.2 mg/dL (ref 0.0–40.0)

## 2020-05-28 LAB — HEMOGLOBIN A1C: Hgb A1c MFr Bld: 6.1 % (ref 4.6–6.5)

## 2020-05-28 MED ORDER — ALPRAZOLAM 0.25 MG PO TABS: ORAL_TABLET | ORAL | 1 refills | Status: DC

## 2020-05-28 MED ORDER — SYNTHROID 75 MCG PO TABS: 75.0000 ug | ORAL_TABLET | Freq: Every day | ORAL | 3 refills | Status: DC

## 2020-05-28 NOTE — Addendum Note (Signed)
Addended by: Brandy Hale on: 05/28/2020 01:59 PM   Modules accepted: Orders

## 2020-06-09 ENCOUNTER — Ambulatory Visit (INDEPENDENT_AMBULATORY_CARE_PROVIDER_SITE_OTHER): Payer: Medicare Other

## 2020-06-09 DIAGNOSIS — Z0000 Encounter for general adult medical examination without abnormal findings: Secondary | ICD-10-CM

## 2020-06-09 NOTE — Progress Notes (Signed)
Virtual Visit via Telephone Note  I connected with  Belinda Day on 06/09/20 at 11:45 AM EDT by telephone and verified that I am speaking with the correct person using two identifiers.  Medicare Annual Wellness visit completed telephonically due to Covid-19 pandemic.   Persons participating in this call: This Health Coach and this patient.   Location: Patient: home Provider: office   I discussed the limitations, risks, security and privacy concerns of performing an evaluation and management service by telephone and the availability of in person appointments. The patient expressed understanding and agreed to proceed.  Unable to perform video visit due to video visit attempted and failed and/or patient does not have video capability.   Some vital signs may be absent or patient reported.   Willette Brace, LPN    Subjective:   Belinda Day is a 75 y.o. female who presents for Medicare Annual (Subsequent) preventive examination.  Review of Systems     Cardiac Risk Factors include: advanced age (>40men, >27 women);hypertension;dyslipidemia     Objective:    There were no vitals filed for this visit. There is no height or weight on file to calculate BMI.  Advanced Directives 06/09/2020 08/09/2016 04/17/2015 12/21/2012  Does Patient Have a Medical Advance Directive? Yes Yes Yes Patient has advance directive, copy not in chart  Type of Advance Directive Healthcare Power of [place];Living will [place];Living will [place];Living will  Does patient want to make changes to medical advance directive? - No - Patient declined No - Patient declined No  Copy of Healthcare Power of Attorney in Chart? No - copy requested No - copy requested No - copy requested Copy requested from family  Pre-existing out of facility DNR order (yellow form or pink MOST form) - - - No    Current Medications (verified) Outpatient  Encounter Medications as of 06/09/2020  Medication Sig  . acetaminophen (TYLENOL) 650 MG CR tablet every 8 (eight) hours as needed for pain.   Marland Kitchen albuterol (PROAIR HFA) 108 (90 Base) MCG/ACT inhaler Inhale 2 puffs into the lungs every 6 (six) hours as needed for wheezing or shortness of breath.  . ALPRAZolam (XANAX) 0.25 MG tablet TAKE 1 TABLET BY MOUTH AT BEDTIME AS NEEDED FOR SLEEP  . amitriptyline (ELAVIL) 50 MG tablet take 1 tablet by mouth at bedtime  . aspirin EC 81 MG tablet Take 81 mg by mouth daily.  Marland Kitchen atorvastatin (LIPITOR) 80 MG tablet TAKE 1 TABLET(80 MG) BY MOUTH DAILY  . azithromycin (ZITHROMAX) 250 MG tablet TAKE 1 TABLET(250 MG) BY MOUTH DAILY  . Dextromethorphan-Guaifenesin (MUCINEX DM) 30-600 MG TB12 Take 1 tablet by mouth daily. Patient takes 1200mg   . DULERA 200-5 MCG/ACT AERO INHALE 2 PUFFS INTO THE LUNGS EVERY MORNING AND EVERY NIGHT AT BEDTIME  . Melatonin 5 MG TABS Take 5 mg by mouth at bedtime.  . metFORMIN (GLUCOPHAGE) 500 MG tablet TAKE 1 TABLET(500 MG) BY MOUTH TWICE DAILY WITH A MEAL  . Multiple Vitamin (MULTIVITAMIN) tablet Take 1 tablet by mouth daily.  . nebivolol (BYSTOLIC) 2.5 MG tablet Take 1 tablet (2.5 mg total) by mouth daily.  . Probiotic Product (RESTORA) CAPS Take 1 capsule by mouth daily.  Marland Kitchen Respiratory Therapy Supplies (FLUTTER) DEVI Use as directed  . SYNTHROID 75 MCG tablet Take 1 tablet (75 mcg total) by mouth daily before breakfast.  . telmisartan (MICARDIS) 80 MG tablet TAKE 1 TABLET(80 MG) BY MOUTH DAILY  .  VITAMIN D, CHOLECALCIFEROL, PO Take by mouth.  . [DISCONTINUED] Apoaequorin (PREVAGEN EXTRA STRENGTH) 20 MG CAPS Take 1 capsule by mouth. (Patient not taking: Reported on 06/09/2020)  . [DISCONTINUED] linaclotide (LINZESS) 145 MCG CAPS capsule Take 145 mcg by mouth daily before breakfast. (Patient not taking: No sig reported)   No facility-administered encounter medications on file as of 06/09/2020.    Allergies (verified) Bactrim  [sulfamethoxazole-trimethoprim], Ciprofloxacin, Codeine, Erythromycin, and Levofloxacin   History: Past Medical History:  Diagnosis Date  . Bronchiectasis    oxygen at night in the past  . CAD (coronary artery disease)   . Diverticulitis 2014  . GERD (gastroesophageal reflux disease)   . History of shingles 04/2012  . HTN (hypertension)   . Hyperlipidemia   . Hypothyroidism   . MAI (mycobacterium avium-intracellulare) ([place])   . Neuritis of upper extremity    Past Surgical History:  Procedure Laterality Date  . CORONARY ARTERY BYPASS GRAFT  2004   x3 CABG   Family History  Problem Relation Age of Onset  . Colon cancer Mother   . Hyperlipidemia Mother   . Hypertension Mother   . Heart disease Mother   . Stroke Mother   . Diabetes Mother   . Colon cancer Father   . Arthritis Father   . Hyperlipidemia Father   . Hypertension Father   . Heart disease Father   . Stroke Father   . Atopy Neg Hx    Social History   Socioeconomic History  . Marital status: Single    Spouse name: Not on file  . Number of children: Not on file  . Years of education: Not on file  . Highest education level: Not on file  Occupational History  . Occupation: Retired   Tobacco Use  . Smoking status: Former Smoker    Packs/day: 1.00    Years: 20.00    Pack years: 20.00    Types: Cigarettes    Quit date: 01/25/1970    Years since quitting: 50.4  . Smokeless tobacco: Never Used  Substance and Sexual Activity  . Alcohol use: No  . Drug use: No  . Sexual activity: Not on file  Other Topics Concern  . Not on file  Social History Narrative   Family: Single never married, no children, cat and rehabs turtles and tortoise   Went to [employer] in [employer] and social work, some business courses at [employer], EMT for 6 years.    LIves alone. Completely independent.    Lives in retirement community.       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 Difficulty of Paying Living Expenses: Not hard at all  Food Insecurity: No Food Insecurity  . Worried About Charity fundraiser in the Last Year: Never true  . Ran Out of Food in the Last Year: Never true  Transportation Needs: No Transportation Needs  . Lack of Transportation (Medical): No  . Lack of Transportation (Non-Medical): No  Physical Activity: Inactive  . Days of Exercise per Week: 0 days  . Minutes of Exercise per Session: 0 min  Stress: Not on file  Social Connections: Moderately Isolated  . Frequency of Communication with Friends and Family: More than three times a week  . Frequency of Social Gatherings with Friends and Family: Twice a week  .  Attends Religious Services: 1 to 4 times per year  . Active Member of Clubs or Organizations: No  . Attends Archivist Meetings: Never  . Marital Status: Never married    Tobacco Counseling Counseling given: Not Answered   Clinical Intake:  Pre-visit preparation completed: Yes  Pain : No/denies pain     BMI - recorded: 20.83 Nutritional Status: BMI of 19-24  Normal Nutritional Risks: None Diabetes: No  How often do you need to have someone help you when you read instructions, pamphlets, or other written materials from your doctor or pharmacy?: 1 - Never  Diabetic?No  Interpreter Needed?: No  Information entered by :: Charlott Rakes, LPN   Activities of Daily Living In your present state of health, do you have any difficulty performing the following activities: 06/09/2020 05/28/2020  Hearing? N N  Vision? N N  Difficulty concentrating or making decisions? N N  Walking or climbing stairs? N N  Dressing or bathing? N N  Doing errands, shopping? N N  Preparing Food and eating ? N -  Using the Toilet? N -  In the past six months, have you accidently leaked urine? N -  Do you have  problems with loss of bowel control? N -  Managing your Medications? N -  Managing your Finances? N -  Housekeeping or managing your Housekeeping? N -  Some recent data might be hidden    Patient Care Team: Marin Olp, MD as PCP - General (Family Medicine) Stanford Breed Denice Bors, MD as PCP - Cardiology (Cardiology) Opthamology, [facility] as Consulting Physician (Ophthalmology) Tanda Rockers, MD as Consulting Physician (Pulmonary Disease) Roseanne Kaufman, MD as Consulting Physician (Orthopedic Surgery) Martinique, Amy, MD as Consulting Physician (Dermatology)  Indicate any recent Medical Services you may have received from other than [facility] providers in the past year (date may be approximate).     Assessment:   This is a routine wellness examination for [place].  Hearing/Vision screen  Hearing Screening   125Hz  250Hz  500Hz  1000Hz  2000Hz  3000Hz  4000Hz  6000Hz  8000Hz   Right ear:           Left ear:           Comments: Pt denies any hearing issues   Vision Screening Comments: Pt follows with [facility] opthalmology for annual eye exams   Dietary issues and exercise activities discussed: Current Exercise Habits: The patient does not participate in regular exercise at present (works in yard daily)  Goals Addressed            This [place]   . Patient Stated       Maintain weight       Depression Screen PHQ 2/9 Scores 06/09/2020 05/28/2020 05/08/2019 04/16/2019 09/08/2018 08/11/2017 08/09/2016  PHQ - 2 Score 0 0 0 0 0 0 0  PHQ- 9 Score - 0 - - - - -    Fall Risk Fall Risk  06/09/2020 05/28/2020 05/15/2019 05/08/2019 09/08/2018  Falls in the past year? 0 0 0 0 0  Number falls in past yr: 0 0 0 0 0  Injury with Fall? 0 0 0 0 0  Risk for fall due to : Impaired vision - - - -  Follow up Falls prevention discussed - - Falls evaluation completed;Education provided;Falls prevention discussed -    FALL RISK PREVENTION PERTAINING TO THE HOME:  Any stairs in or around the home? No   If Day, are there any without handrails? No  Home free of loose throw  rugs in walkways, pet beds, electrical cords, etc? Yes  Adequate lighting in your home to reduce risk of falls? Yes   ASSISTIVE DEVICES UTILIZED TO PREVENT FALLS:  Life alert? No  Use of a cane, walker or w/c? No  Grab bars in the bathroom? Yes  Shower chair or bench in shower? Yes  Elevated toilet seat or a handicapped toilet? No   TIMED UP AND GO:  Was the test performed? No .    Cognitive Function: MMSE - Mini Mental State Exam 08/11/2017  Not completed: (No Data)     6CIT Screen 06/09/2020 05/08/2019  What Year? 0 points 0 points  What month? 0 points 0 points  What time? - 0 points  Count back from 20 0 points 0 points  Months in reverse 0 points 0 points  Repeat phrase 0 points 0 points  Total Score - 0    Immunizations Immunization History  Administered Date(s) Administered  . H1N1 01/02/2008  . Influenza Split 10/14/2010, 10/07/2011  . Influenza, High Dose Seasonal PF 11/05/2016  . Influenza,inj,Quad PF,6+ Mos 10/02/2012, 10/16/2015, 11/07/2017, 10/05/2018, 10/30/2019  . Influenza-Unspecified 09/25/2013, 10/15/2014, 11/07/2017  . Moderna Sars-Covid-2 Vaccination 02/09/2019, 03/12/2019, 09/05/2019, 05/12/2020  . Pneumococcal Conjugate-13 08/14/2013  . Pneumococcal Polysaccharide-23 10/29/2011  . Td 05/15/2019  . Zoster Recombinat (Shingrix) 06/25/2017, 09/30/2017    TDAP status: Up to date  Flu Vaccine status: Up to date  Pneumococcal vaccine status: Up to date  Covid-19 vaccine status: Completed vaccines  Qualifies for Shingles Vaccine? Yes   Zostavax completed Yes   Shingrix Completed?: Yes  Screening Tests Health Maintenance  Topic Date Due  . INFLUENZA VACCINE  08/25/2020  . MAMMOGRAM  01/07/2021  . COLONOSCOPY (Pts 45-45yrs Insurance coverage will need to be confirmed)  08/10/2021  . TETANUS/TDAP  05/14/2029  . DEXA SCAN  Completed  . COVID-19 Vaccine  Completed  .  Hepatitis C Screening  Completed  . PNA vac Low Risk Adult  Completed  . HPV VACCINES  Aged Out    Health Maintenance  There are no preventive care reminders to display for this patient.  Colorectal cancer screening: Type of screening: Colonoscopy. Completed 08/11/18. Repeat every 3 years  Mammogram status: Completed 01/08/20. Repeat every year  Bone Density status: Completed 03/12/19. Results reflect: Bone density results: OSTEOPENIA. Repeat every 0 years.   Additional Screening:  Hepatitis C Screening:  Completed 01/29/15  Vision Screening: Recommended annual ophthalmology exams for early detection of glaucoma and other disorders of the eye. Is the patient up to date with their annual eye exam?  Yes  Who is the provider or what is the name of the office in which the patient attends annual eye exams? [facility]  If pt is not established with a provider, would they like to be referred to a provider to establish care? No .   Dental Screening: Recommended annual dental exams for proper oral hygiene  Community Resource Referral / Chronic Care Management: CRR required this visit?  No   CCM required this visit?  No      Plan:     I have personally reviewed and noted the following in the patient's chart:   . Medical and social history . Use of alcohol, tobacco or illicit drugs  . Current medications and supplements including opioid prescriptions.  . Functional ability and status . Nutritional status . Physical activity . Advanced directives . List of other physicians . Hospitalizations, surgeries, and ER visits in previous 12 months .  Vitals . Screenings to include cognitive, depression, and falls . Referrals and appointments  In addition, I have reviewed and discussed with patient certain preventive protocols, quality metrics, and best practice recommendations. A written personalized care plan for preventive services as well as general preventive health  recommendations were provided to patient.     Willette Brace, LPN   [id number]   Nurse Notes: None

## 2020-06-09 NOTE — Patient Instructions (Addendum)
Belinda Day , Thank you for taking time to come for your Medicare Wellness Visit. I appreciate your ongoing commitment to your health goals. Please review the following plan we discussed and let me know if I can assist you in the future.   Screening recommendations/referrals: Colonoscopy: Done 08/11/18 Mammogram: Done 01/08/20 Bone Density: 03/12/19 Recommended yearly ophthalmology/optometry visit for glaucoma screening and checkup Recommended yearly dental visit for hygiene and checkup  Vaccinations: Influenza vaccine: Up to date Pneumococcal vaccine: Up to date Tdap vaccine: Up to date Shingles vaccine: Completed 6/1 & 9/6/ 19 Covid-19:Completed 1/15, 2/15, 09/05/19& 05/12/20  Advanced directives: Please bring a copy of your health care power of attorney and living will to the office at your convenience.  Conditions/risks identified: Maintain weight   Next appointment: Follow up in one year for your annual wellness visit    Preventive Care 65 Years and Older, Female Preventive care refers to lifestyle choices and visits with your health care provider that can promote health and wellness. What does preventive care include?  A yearly physical exam. This is also called an annual well check.  Dental exams once or twice a year.  Routine eye exams. Ask your health care provider how often you should have your eyes checked.  Personal lifestyle choices, including:  Daily care of your teeth and gums.  Regular physical activity.  Eating a healthy diet.  Avoiding tobacco and drug use.  Limiting alcohol use.  Practicing safe sex.  Taking low-dose aspirin every day.  Taking vitamin and mineral supplements as recommended by your health care provider. What happens during an annual well check? The services and screenings done by your health care provider during your annual well check will depend on your age, overall health, lifestyle risk factors, and family history of  disease. Counseling  Your health care provider may ask you questions about your:  Alcohol use.  Tobacco use.  Drug use.  Emotional well-being.  Home and relationship well-being.  Sexual activity.  Eating habits.  History of falls.  Memory and ability to understand (cognition).  Work and work Statistician.  Reproductive health. Screening  You may have the following tests or measurements:  Height, weight, and BMI.  Blood pressure.  Lipid and cholesterol levels. These may be checked every 5 years, or more frequently if you are over 85 years old.  Skin check.  Lung cancer screening. You may have this screening every year starting at age 28 if you have a 30-pack-year history of smoking and currently smoke or have quit within the past 15 years.  Fecal occult blood test (FOBT) of the stool. You may have this test every year starting at age 29.  Flexible sigmoidoscopy or colonoscopy. You may have a sigmoidoscopy every 5 years or a colonoscopy every 10 years starting at age 19.  Hepatitis C blood test.  Hepatitis B blood test.  Sexually transmitted disease (STD) testing.  Diabetes screening. This is done by checking your blood sugar (glucose) after you have not eaten for a while (fasting). You may have this done every 1-3 years.  Bone density scan. This is done to screen for osteoporosis. You may have this done starting at age 14.  Mammogram. This may be done every 1-2 years. Talk to your health care provider about how often you should have regular mammograms. Talk with your health care provider about your test results, treatment options, and if necessary, the need for more tests. Vaccines  Your health care provider may recommend certain  vaccines, such as:  Influenza vaccine. This is recommended every year.  Tetanus, diphtheria, and acellular pertussis (Tdap, Td) vaccine. You may need a Td booster every 10 years.  Zoster vaccine. You may need this after age  59.  Pneumococcal 13-valent conjugate (PCV13) vaccine. One dose is recommended after age 59.  Pneumococcal polysaccharide (PPSV23) vaccine. One dose is recommended after age 21. Talk to your health care provider about which screenings and vaccines you need and how often you need them. This information is not intended to replace advice given to you by your health care provider. Make sure you discuss any questions you have with your health care provider. Document Released: 02/07/2015 Document Revised: 10/01/2015 Document Reviewed: 11/12/2014 Elsevier Interactive Patient Education  2017 [place] Prevention in the Home Falls can cause injuries. They can happen to people of all ages. There are many things you can do to make your home safe and to help prevent falls. What can I do on the outside of my home?  Regularly fix the edges of walkways and driveways and fix any cracks.  Remove anything that might make you trip as you walk through a door, such as a raised step or threshold.  Trim any bushes or trees on the path to your home.  Use bright outdoor lighting.  Clear any walking paths of anything that might make someone trip, such as rocks or tools.  Regularly check to see if handrails are loose or broken. Make sure that both sides of any steps have handrails.  Any raised decks and porches should have guardrails on the edges.  Have any leaves, snow, or ice cleared regularly.  Use sand or salt on walking paths during winter.  Clean up any spills in your garage right away. This includes oil or grease spills. What can I do in the bathroom?  Use night lights.  Install grab bars by the toilet and in the tub and shower. Do not use towel bars as grab bars.  Use non-skid mats or decals in the tub or shower.  If you need to sit down in the shower, use a plastic, non-slip stool.  Keep the floor dry. Clean up any water that spills on the floor as soon as it happens.  Remove  soap buildup in the tub or shower regularly.  Attach bath mats securely with double-sided non-slip rug tape.  Do not have throw rugs and other things on the floor that can make you trip. What can I do in the bedroom?  Use night lights.  Make sure that you have a light by your bed that is easy to reach.  Do not use any sheets or blankets that are too big for your bed. They should not hang down onto the floor.  Have a firm chair that has side arms. You can use this for support while you get dressed.  Do not have throw rugs and other things on the floor that can make you trip. What can I do in the kitchen?  Clean up any spills right away.  Avoid walking on wet floors.  Keep items that you use a lot in easy-to-reach places.  If you need to reach something above you, use a strong step stool that has a grab bar.  Keep electrical cords out of the way.  Do not use floor polish or wax that makes floors slippery. If you must use wax, use non-skid floor wax.  Do not have throw rugs and other things  on the floor that can make you trip. What can I do with my stairs?  Do not leave any items on the stairs.  Make sure that there are handrails on both sides of the stairs and use them. Fix handrails that are broken or loose. Make sure that handrails are as long as the stairways.  Check any carpeting to make sure that it is firmly attached to the stairs. Fix any carpet that is loose or worn.  Avoid having throw rugs at the top or bottom of the stairs. If you do have throw rugs, attach them to the floor with carpet tape.  Make sure that you have a light switch at the top of the stairs and the bottom of the stairs. If you do not have them, ask someone to add them for you. What else can I do to help prevent falls?  Wear shoes that:  Do not have high heels.  Have rubber bottoms.  Are comfortable and fit you well.  Are closed at the toe. Do not wear sandals.  If you use a  stepladder:  Make sure that it is fully opened. Do not climb a closed stepladder.  Make sure that both sides of the stepladder are locked into place.  Ask someone to hold it for you, if possible.  Clearly mark and make sure that you can see:  Any grab bars or handrails.  First and last steps.  Where the edge of each step is.  Use tools that help you move around (mobility aids) if they are needed. These include:  Canes.  Walkers.  Scooters.  Crutches.  Turn on the lights when you go into a dark area. Replace any light bulbs as soon as they burn out.  Set up your furniture so you have a clear path. Avoid moving your furniture around.  If any of your floors are uneven, fix them.  If there are any pets around you, be aware of where they are.  Review your medicines with your doctor. Some medicines can make you feel dizzy. This can increase your chance of falling. Ask your doctor what other things that you can do to help prevent falls. This information is not intended to replace advice given to you by your health care provider. Make sure you discuss any questions you have with your health care provider. Document Released: 11/07/2008 Document Revised: 06/19/2015 Document Reviewed: 02/15/2014 Elsevier Interactive Patient Education  2017 [employer].

## 2020-07-28 ENCOUNTER — Other Ambulatory Visit: Payer: Self-pay | Admitting: Internal Medicine

## 2020-08-20 ENCOUNTER — Other Ambulatory Visit: Payer: Self-pay | Admitting: Family Medicine

## 2020-08-25 ENCOUNTER — Other Ambulatory Visit: Payer: Self-pay

## 2020-08-25 ENCOUNTER — Encounter: Payer: Self-pay | Admitting: Internal Medicine

## 2020-08-25 ENCOUNTER — Telehealth: Payer: Self-pay

## 2020-08-25 ENCOUNTER — Ambulatory Visit (INDEPENDENT_AMBULATORY_CARE_PROVIDER_SITE_OTHER): Payer: Medicare Other

## 2020-08-25 ENCOUNTER — Ambulatory Visit: Payer: Medicare Other | Admitting: Internal Medicine

## 2020-08-25 DIAGNOSIS — A310 Pulmonary mycobacterial infection: Secondary | ICD-10-CM

## 2020-08-25 DIAGNOSIS — J479 Bronchiectasis, uncomplicated: Secondary | ICD-10-CM

## 2020-08-25 DIAGNOSIS — M79644 Pain in right finger(s): Secondary | ICD-10-CM | POA: Diagnosis not present

## 2020-08-25 DIAGNOSIS — M1812 Unilateral primary osteoarthritis of first carpometacarpal joint, left hand: Secondary | ICD-10-CM | POA: Diagnosis not present

## 2020-08-25 DIAGNOSIS — M65849 Other synovitis and tenosynovitis, unspecified hand: Secondary | ICD-10-CM | POA: Diagnosis not present

## 2020-08-25 NOTE — Progress Notes (Signed)
Subjective:    Patient ID: Belinda Day, female   DOB: [birth year]    MRN: [id number]   Brief patient profile:  73 yowf MM/ quit smoking 1972 with documented right middle lobe syndrome and evidence of bronchiectasis by CT scan in March 2002  And GOLD II criteria for copd 09/2010     History of Present Illness  08/08/07 FOB with classic cobblestoning and MAI on culture.   08/15/07 given Levaquin x 10 days with resolution bloody mucus, but "felt she had flu the whole time" with aches, feverish   August 29, 2007 ov: first post bronch co still coughing up mucus clear and initiate rx with symbicort/ [place]   October 19, 2007 ov no cough , sob, feeling great but no improvement on cxr   December 07, 2007 ov feeling great, minimal am cough not productive. No sob.   Opth eval, labs ok 10/2007   September 06, 2008 ov overall better over the last year, less tendency to exac on zmax and ethambutol. rec complete another year > satisfied improved 90% and stopped zmax and eth 08/2009     05/16/17 rec zmax daily but did not do  11/14/2017  f/u ov/Belinda Day re: obst bronchiectasis/ confused with details of care / taking zpak prn but also has omnicef and not sure what to do with it  Chief Complaint  Patient presents with   Follow-up    no current problems   Dyspnea:  yardwork ok / MMRC1 = can walk nl pace, flat grade, can't hurry or go uphills or steps s sob   Cough: variable esp in am / using flutter and vest prn / mucus beige and about a tbsp in am s heme Sleeping:  Bed flat and one big pilow  SABA use: rarely needed  rec Plan A = Automatic = symbicort 160  And take zmax daily  Plan B = Backup for  breathing Only use your albuterol as a rescue medication Plan B = for mucus if Nastier than nl especially assoc with any fever >  omnicef 300 mg twice daily x 7days Plan C = Call me if needed and A and B aren't working well   cxr on return    08/27/2019  f/u ov/Belinda Day re: bronchiectasis  obstructive  maint on symbicort 160 2bid and zmax daily  Chief Complaint  Patient presents with   Follow-up    sob on exertion and productive cough with yellow sputum  Dyspnea: yardwork  Cough: some worse in am/ no blood on zmax one daily minimally dicolored  Sleeping: on flat bed with big pillow  SABA use: not using  02: none  rec I will check to see what the cheapest alternative is for symb [place] to taper the pm dose symbicort off       08/25/2020  f/u ov/Belinda Day re: obstructive bronchiectasis maint on dulera 200/ zmax daily  Chief Complaint  Patient presents with   Follow-up    No complaints or concerns... Patient is doing well   Dyspnea:  lots of yardwork is main activity Cough: some am congestion/ pale yellow thick  Sleeping: flat bed, one big pillow  SABA use: rarely 02: none  Covid status:   vax x 4  Some pm HB, not using bed blocks just pepcid prn    No obvious day to day or daytime variability or assoc excess/ purulent sputum or mucus plugs or hemoptysis or cp or chest tightness, subjective wheeze  or overt sinus symptoms.   Sleeping  without nocturnal   exacerbation  of respiratory  c/o's or need for noct saba. Also denies any obvious fluctuation of symptoms with weather or environmental changes or other aggravating or alleviating factors except as outlined above   No unusual exposure hx or h/o childhood pna/ asthma or knowledge of premature birth.  Current Allergies, Complete Past Medical History, Past Surgical History, Family History, and Social History were reviewed in [employer] record.  ROS  The following are not active complaints unless bolded Hoarseness, sore throat, dysphagia, dental problems, itching, sneezing,  nasal congestion or discharge of excess mucus or purulent secretions, ear ache,   fever, chills, sweats, unintended wt loss or wt gain, classically pleuritic or exertional cp,  orthopnea pnd or arm/hand swelling  or leg swelling,  presyncope, palpitations, abdominal pain, anorexia, nausea, vomiting, diarrhea  or change in bowel habits or change in bladder habits, change in stools or change in urine, dysuria, hematuria,  rash, arthralgias, visual complaints, headache, numbness, weakness or ataxia or problems with walking or coordination,  change in mood or  memory.        Current Meds  Medication Sig   acetaminophen (TYLENOL) 650 MG CR tablet every 8 (eight) hours as needed for pain.    albuterol (PROAIR HFA) 108 (90 Base) MCG/ACT inhaler Inhale 2 puffs into the lungs every 6 (six) hours as needed for wheezing or shortness of breath.   ALPRAZolam (XANAX) 0.25 MG tablet TAKE 1 TABLET BY MOUTH AT BEDTIME AS NEEDED FOR SLEEP   amitriptyline (ELAVIL) 50 MG tablet TAKE 1 TABLET BY MOUTH AT BEDTIME   aspirin EC 81 MG tablet Take 81 mg by mouth daily.   atorvastatin (LIPITOR) 80 MG tablet TAKE 1 TABLET(80 MG) BY MOUTH DAILY   azithromycin (ZITHROMAX) 250 MG tablet TAKE 1 TABLET(250 MG) BY MOUTH DAILY   Dextromethorphan-Guaifenesin (MUCINEX DM) 30-600 MG TB12 Take 1 tablet by mouth daily. Patient takes '1200mg'$    DULERA 200-5 MCG/ACT AERO INHALE 2 PUFFS INTO THE LUNGS EVERY MORNING AND EVERY NIGHT AT BEDTIME   Melatonin 5 MG TABS Take 5 mg by mouth at bedtime.   metFORMIN (GLUCOPHAGE) 500 MG tablet TAKE 1 TABLET(500 MG) BY MOUTH TWICE DAILY WITH A MEAL   Multiple Vitamin (MULTIVITAMIN) tablet Take 1 tablet by mouth daily.   nebivolol (BYSTOLIC) 2.5 MG tablet Take 1 tablet (2.5 mg total) by mouth daily.   Probiotic Product (RESTORA) CAPS Take 1 capsule by mouth daily.   Respiratory Therapy Supplies (FLUTTER) DEVI Use as directed   SYNTHROID 75 MCG tablet TAKE 1 TABLET(75 MCG) BY MOUTH DAILY BEFORE AND BREAKFAST   telmisartan (MICARDIS) 80 MG tablet TAKE 1 TABLET(80 MG) BY MOUTH DAILY   VITAMIN D, CHOLECALCIFEROL, PO Take by mouth.                    Past Medical History:  Bronchiectasis see CT SE 04/13/00  - HFA 75% November 19, 2009  - alpha one screen 01/14/2015 >  MM  - IgE 01/14/2015 = 11  MAI  - Rx Zmax and ETH 08/29/07 > 08/2009 restarted empirically 05/31/14 > 09/03/14 (no change in cough so just use zpak for flares)  - Rx zmax maint  10/16/2015 >>> d/c 08/03/2016 > restarted 05/16/17 and clinically improved 08/02/2017 using prn omnicef to supplment  - [place] eval   [place] try cycles of cipro April 02, 2010  HEALTH MAINTENANCE...........................[name] - T[place] 10/2007  - Pneumovax 2005   and 10/29/2011 age 38, prevnar 08/16/2013  CAD  Hyperlipidemia  Hypertension  History of cough with ACE inhibition.  Gastroesophageal reflux disease         Objective:   Physical Exam  08/25/2020    110    02/26/2020   116 08/27/2019    119 02/16/2019  124 08/15/2018  123  Wt 130 October 19, 2007>140 March 31, 2010 > 127 07/16/2010 > 10/14/2010  120 > 05/04/2011  117 > 07/30/2011  120 > 10/29/2011 118 > 130  05/01/2012 > 12/12/2012 132 >  08/14/13 137 >    02/20/2014  137 >  05/31/2014 134 > 09/03/2014    140 > 10/15/2014 137 > 01/14/2015 137 >  05/15/2015 123 >08/14/2015  124 >  10/16/2015 126 > 03/02/2016   124  > 05/04/2016  126 > 08/03/2016   130 > 02/14/2017  126 > 05/16/2017 128 > 08/02/2017  119 > 11/14/2017  116      Vital signs reviewed  08/25/2020  - Note at rest 02 sats  98% on RA   General appearance:    very pleasant ambulatory elderly wf nad      HEENT : pt wearing mask not removed for exam due to covid -19 concerns.    NECK :  without JVD/Nodes/TM/ nl carotid upstrokes bilaterally   LUNGS: no acc muscle use,  Mod barrel  contour chest wall with bilateral  mid exp wheezes and  without cough on insp or exp maneuvers and mod  Hyperresonant  to  percussion bilaterally     CV:  RRR  no s3 or murmur or increase in P2, and no edema   ABD:  soft and nontender with pos mid insp Hoover's  in the supine position. No bruits or organomegaly appreciated, bowel sounds nl  MS:     ext warm  without deformities, calf tenderness, cyanosis or clubbing No obvious joint restrictions   SKIN: warm and dry without lesions    NEURO:  alert, approp, nl sensorium with  no motor or cerebellar deficits apparent.         CXR PA and Lateral:   08/25/2020 :    I personally reviewed images and agree with radiology impression as follows:    Chronic changes of bronchiectasis and scarring.  No active disease          Assessment:

## 2020-08-25 NOTE — Assessment & Plan Note (Addendum)
08/08/07 FOB with classic cobblestoning and MAI on culture.  - Rx 08/2007   To 08/2009 with ETH/Zmax - restarted empirically 05/31/14 > stopped 09/03/14 no benefit perceived, no change on cxr  - restart zmax daily 03/02/2016 > d/c 08/03/2016 > no change in symptoms or freq exac - -zmax daily restarted 05/16/17 and clinically improved 08/02/2017 using prn omnicef to supplment     This is an extremely common benign condition in the elderly and does not warrant aggressive eval/ rx at this point unless there is a clinical correlation suggesting unaddressed pulmonary infection (purulent sputum, night sweats, unintended wt loss, doe) or evolution of  obvious changes on plain cxr (as opposed to serial CT, which is way over sensitive to make clinical decisions re intervention and treatment in the elderly, who tend to tolerate both dx and treatment poorly) .   >>> no evidence of clinical or radiographic progression on plain xray> no change rx          Each maintenance medication was reviewed in detail including emphasizing most importantly the difference between maintenance and prns and under what circumstances the prns are to be triggered using an action plan format where appropriate.  Total time for H and P, chart review, counseling, reviewing hfa and flutter device(s) and generating customized AVS unique to this office visit / same day charting = 31 min

## 2020-08-25 NOTE — Telephone Encounter (Signed)
Pt called requesting if she can get the Pneumococcal 23 injection. Is she able to go ahead and get the injection?

## 2020-08-25 NOTE — Assessment & Plan Note (Addendum)
Onset of symptoms around 2002     - PFT's 10/14/2010  FEV1  1.25 (68%) and ratio 58% and DLCO 90%     - PFT's 10/29/2011  FEV1  1.33 (74%) and ratio 57 % and DLCO 93%    - PFTs 08/14/2013   FEV1  1.16 (60%) and ratio 61 with dlco 83%     - Flutter valve added 09/03/14      - alpha one   01/14/2015 >  MM, level 147     - IgE  01/14/15  11 - CT chest 04/17/15 Marked chronic bronchiectasis and volume loss in the right middle lobe with milder bronchiectasis, bronchial wall thickening, and nodular densities throughout the right upper and right lower lobe suggestive of chr onic endobronchial/atypical mycobacterial infection. - 10/16/2015 changed to symbicort 80 2bid (? Higher doses contributing to w MAI /freq of infections)   - 03/02/2016  After extensive coaching HFA effectiveness =    90%  - 05/04/2016 VEST stared around May 25 2016 - try off zmax 08/03/2016 > no change clinically as of 11/05/2016  - 02/14/2017 cycles of omnicef x 7 days prn purulent sputum  PFT's  05/16/2017  FEV1 0.89 (47 % ) ratio 60  p 12 % improvement from saba p nothing prior to study   - 05/16/2017  After extensive coaching inhaler device  effectiveness =    90%  - 05/16/17 quant Ig's ok x M slt low (not acutely ill at the time)  -zmax daily restarted 05/16/17 and clinically improved 08/02/2017 using prn omnicef to supplment but did not continue zmax as rec    - restart zmax daily 11/14/2017 > improved 02/14/2018  - 08/25/2020  After extensive coaching inhaler device,  effectiveness =    95%  > continue symbicort 160 and prn saba  rx as AB with symbicort 160/zmax and f/u yearly .  Late add:  She is cleared for surgery on her hand including gen anesthesia if required.

## 2020-08-25 NOTE — Telephone Encounter (Signed)
Pt had 1st one in 2023, ok for the 2nd?

## 2020-08-25 NOTE — Patient Instructions (Signed)
No change in medications but add pepcid 20 mg after supper   GERD (REFLUX)  is an extremely common cause of respiratory symptoms just like yours , many times with no obvious heartburn at all.    It can be treated with medication, but also with lifestyle changes including elevation of the head of your bed (ideally with 6 -8inch blocks under the headboard of your bed),  Smoking cessation, avoidance of late meals, excessive alcohol, and avoid fatty foods, chocolate, peppermint, colas, red wine, and acidic juices such as orange juice.  NO MINT OR MENTHOL PRODUCTS SO NO COUGH DROPS  USE SUGARLESS CANDY INSTEAD (Jolley ranchers or Stover's or Life Savers) or even ice chips will also do - the key is to swallow to prevent all throat clearing. NO OIL BASED VITAMINS - use powdered substitutes.  Avoid fish oil when coughing.   Please remember to go to the  x-ray department  for your tests - we will call you with the results when they are available    Please schedule a follow up visit in 12 months but call sooner if needed

## 2020-08-25 NOTE — Telephone Encounter (Signed)
She should get prevnar 20- if she is agreeable to that yes she can get that

## 2020-08-26 ENCOUNTER — Encounter: Payer: Self-pay | Admitting: Internal Medicine

## 2020-08-26 NOTE — Telephone Encounter (Signed)
Patient is scheduled   

## 2020-08-26 NOTE — Telephone Encounter (Signed)
Pt says that she is interested in the Prevnar 20. Please schedule nurse visit with pt.   Thanks

## 2020-08-27 ENCOUNTER — Ambulatory Visit (INDEPENDENT_AMBULATORY_CARE_PROVIDER_SITE_OTHER): Payer: Medicare Other

## 2020-08-27 ENCOUNTER — Other Ambulatory Visit: Payer: Self-pay

## 2020-08-27 ENCOUNTER — Encounter: Payer: Self-pay | Admitting: *Deleted

## 2020-08-27 DIAGNOSIS — Z23 Encounter for immunization: Secondary | ICD-10-CM | POA: Diagnosis not present

## 2020-09-03 ENCOUNTER — Telehealth: Payer: Self-pay

## 2020-09-03 NOTE — Telephone Encounter (Signed)
   [facility] Pre-operative Risk Assessment    Patient Name: Belinda Day  DOB: August 14, 1945 MRN: [id number]   Request for surgical clearance:  What type of surgery is being performed  A1 pulley/trigger release right middle,ring and small finger.Left middle finger trigger injection at the A1 pulley  When is this surgery scheduled 10/03/20  What type of clearance is required Both   Are there any medications that need to be held prior to surgery and how long Aspirin  Practice name and name of physician performing surgery  [facility]   Dr.William Gramig   What is the office phone number  [phone]   7.   What is the office fax number        [phone]  8.   Anesthesia type  Block with IV        sedation   Kathyrn Lass 09/03/2020, 4:05 PM  _________________________________________________________________   (provider comments below)

## 2020-09-04 NOTE — Telephone Encounter (Signed)
   Patient Name: Belinda Day  DOB: 1946/01/23 MRN: [id number]  Primary Cardiologist: Kirk Ruths, MD  Chart reviewed as part of pre-operative protocol coverage. Patient last seen by Dr. Stanford Breed 01/2020 with cardiac hx to include CAD s/p CABG 2002, HTN, HLD, also hx of bronchiectasis, MAI with chronic dyspnea from lung disease. Will route to Dr. Stanford Breed if patient is OK to hold ASA for her trigger finger release then pt will need call. Dr. Stanford Breed - Please route response to P CV DIV PREOP (the pre-op pool). Thank you.  Charlie Pitter, PA-C 09/04/2020, 11:42 AM

## 2020-09-04 NOTE — Telephone Encounter (Signed)
   Name: Belinda Day  DOB: 1946/01/06  MRN: [id number]   Primary Cardiologist: Kirk Ruths, MD  Chart revisited as part of pre-operative protocol coverage. I reached out to patient for update on how she is doing. The patient affirms she has been doing well without any new cardiac symptoms. She has chronic unchanged dyspnea attributed to her chronic lung disease without any recent acceleration in symptoms. Therefore, based on ACC/AHA guidelines, the patient would be at acceptable risk for the planned procedure without further cardiovascular testing. The patient was advised that if she develops new symptoms prior to surgery to contact our office to arrange for a follow-up visit, and she verbalized understanding.  Per Dr. Stanford Breed, [facility] to hold asa prior to surgery and resume after. We usually defer to surgeon how long to hold, usually in the realm of 5-7 days.  I will route this recommendation to the requesting party via Epic fax function and remove from pre-op pool. Please call with questions.  Charlie Pitter, PA-C 09/04/2020, 12:47 PM

## 2020-09-12 NOTE — Telephone Encounter (Signed)
Fax received from Dr. Roseanne Kaufman to perform trigger release of Right middle, ring and small finger on patient.  Patient needs surgery clearance. Patient was just seen 08/25/20. Office protocol is a risk assessment can be sent to surgeon if patient has been seen in 60 days or less.   Sending to Dr. Melvyn Novas for risk assessment or recommendations if patient needs to be seen in office prior to surgical procedure.

## 2020-09-12 NOTE — Telephone Encounter (Signed)
No need for visit > she is cleared > I will amned last ov

## 2020-09-26 NOTE — Progress Notes (Signed)
Chronic Care Management Pharmacy Note  09/30/2020 Name:  Belinda Day MRN:  [id number] DOB:  03-Mar-1945  Summary: PharmD follow up.  Adherence check on metformin.  Recommendations/Changes made from today's visit: None - patient informed metformin refill ready to fill at pharmacy  Plan: FU 6 months   Subjective: Belinda Day is an 75 y.o. year old female who is a primary patient of Hunter, Brayton Mars, MD.  The CCM team was consulted for assistance with disease management and care coordination needs.    Engaged with patient by telephone for follow up visit in response to provider referral for pharmacy case management and/or care coordination services.   Consent to Services:  The patient was given the following information about Chronic Care Management services today, agreed to services, and gave verbal consent: 1. CCM service includes personalized support from designated clinical staff supervised by the primary care provider, including individualized plan of care and coordination with other care providers 2. 24/7 contact phone numbers for assistance for urgent and routine care needs. 3. Service will only be billed when office clinical staff spend 20 minutes or more in a month to coordinate care. 4. Only one practitioner may furnish and bill the service in a calendar month. 5.The patient may stop CCM services at any time (effective at the end of the month) by phone call to the office staff. 6. The patient will be responsible for cost sharing (co-pay) of up to 20% of the service fee (after annual deductible is met). Patient agreed to services and consent obtained.  Patient Care Team: Marin Olp, MD as PCP - General (Family Medicine) Lelon Perla, MD as PCP - Cardiology (Cardiology) Opthamology, [facility] as Consulting Physician (Ophthalmology) Tanda Rockers, MD as Consulting Physician (Pulmonary Disease) Roseanne Kaufman, MD as Consulting Physician (Orthopedic  Surgery) Martinique, Amy, MD as Consulting Physician (Dermatology) Edythe Clarity, [facility] (Pharmacist)  Recent office visits: 05/28/20 Yong Channel) - general exam, no changes to medication at this time  Recent consult visits: 08/25/20 Melvyn Novas, Pulm) - no med changes, patient is cleared for surgery on hand  Hospital visits: None in previous 6 months   Objective:  Lab Results  Component Value Date   CREATININE 0.81 05/28/2020   BUN 20 05/28/2020   GFR 70.14 05/28/2020   GFRNONAA 56 (L) 11/27/2019   GFRAA 65 11/27/2019   NA 140 05/28/2020   K 4.1 05/28/2020   CALCIUM 9.7 05/28/2020   CO2 28 05/28/2020   GLUCOSE 87 05/28/2020    Lab Results  Component Value Date/Time   HGBA1C 6.1 05/28/2020 01:52 PM   HGBA1C 5.9 (H) 11/27/2019 09:50 AM   GFR 70.14 05/28/2020 01:52 PM   GFR 72.86 05/08/2019 08:45 AM    Last diabetic Eye exam:  Lab Results  Component Value Date/Time   HMDIABEYEEXA No Retinopathy 10/11/2019 12:00 AM    Last diabetic Foot exam: No results found for: HMDIABFOOTEX   Lab Results  Component Value Date   CHOL 147 05/28/2020   HDL 65.00 05/28/2020   LDLCALC 67 05/28/2020   LDLDIRECT 47 11/27/2019   TRIG 76.0 05/28/2020   CHOLHDL 2 05/28/2020    Hepatic Function Latest Ref Rng & Units 05/28/2020 11/27/2019 05/08/2019  Total Protein 6.0 - 8.3 g/dL 6.6 6.3 6.1  Albumin 3.5 - 5.2 g/dL 4.4 - 4.1  AST 0 - 37 U/L _0 ALT 0 - 35 U/L _1 Alk Phosphatase 39 - 117 U/L 88 - 84  Total Bilirubin 0.2 - 1.2 mg/dL 0.4 0.6 0.5  Bilirubin, Direct 0.0 - 0.3 mg/dL - - -    Lab Results  Component Value Date/Time   TSH 0.55 05/28/2020 01:52 PM   TSH 1.00 11/27/2019 09:50 AM   FREET4 1.14 02/01/2012 10:03 AM   FREET4 0.94 08/02/2011 09:07 AM    CBC Latest Ref Rng & Units 05/28/2020 05/08/2019 03/10/2018  WBC 4.0 - 10.5 K/uL 7.8 5.8 5.3  Hemoglobin 12.0 - 15.0 g/dL 13.5 13.4 14.3  Hematocrit 36.0 - 46.0 % 40.8 39.3 42.9  Platelets 150.0 - 400.0 K/uL 308.0 270.0 261.0     Lab Results  Component Value Date/Time   VD25OH 50 02/01/2012 10:03 AM    Clinical ASCVD: No  The 10-year ASCVD risk score Mikey Bussing DC Jr., et al., 2013) is: 43.1%   Values used to calculate the score:     Age: 39 years     Sex: Female     Is Non-Hispanic African American: No     Diabetic: Yes     Tobacco smoker: No     Systolic Blood Pressure: 892 mmHg     Is BP treated: Yes     HDL Cholesterol: 65 mg/dL     Total Cholesterol: 147 mg/dL    Depression screen [facility] 2/9 06/09/2020 05/28/2020 05/08/2019  Decreased Interest 0 0 0  Down, Depressed, Hopeless 0 0 0  PHQ - 2 Score 0 0 0  Altered sleeping - 0 -  Tired, decreased energy - 0 -  Change in appetite - 0 -  Feeling bad or failure about yourself  - 0 -  Trouble concentrating - 0 -  Moving slowly or fidgety/restless - 0 -  Suicidal thoughts - 0 -  PHQ-9 Score - 0 -  Difficult doing work/chores - Not difficult at all -  Some recent data might be hidden     Social History   Tobacco Use  Smoking Status Former   Packs/day: 1.00   Years: 20.00   Pack years: 20.00   Types: Cigarettes   Quit date: 01/25/1970   Years since quitting: 50.7  Smokeless Tobacco Never   BP Readings from Last 3 Encounters:  08/25/20 126/64  05/28/20 116/60  02/26/20 132/64   Pulse Readings from Last 3 Encounters:  08/25/20 70  05/28/20 72  02/26/20 76   Wt Readings from Last 3 Encounters:  08/25/20 110 lb 9.6 oz (50.2 kg)  05/28/20 110 lb 3.2 oz (50 kg)  02/26/20 116 lb (52.6 kg)   BMI Readings from Last 3 Encounters:  08/25/20 20.90 kg/m  05/28/20 20.82 kg/m  02/26/20 21.92 kg/m    Assessment/Interventions: Review of patient past medical history, allergies, medications, health status, including review of consultants reports, laboratory and other test data, was performed as part of comprehensive evaluation and provision of chronic care management services.   SDOH:  (Social Determinants of Health) assessments and interventions  performed: Yes  Financial Resource Strain: Low Risk    Difficulty of Paying Living Expenses: Not hard at all    SDOH Screenings   Alcohol Screen: Not on file  Depression (PHQ2-9): Low Risk    PHQ-2 Score: 0  Financial Resource Strain: Low Risk    Difficulty of Paying Living Expenses: Not hard at all  Food Insecurity: No Food Insecurity   Worried About Charity fundraiser in the Last Year: Never true   Ran Out of Food in the Last Year: Never true  Housing: Low Risk  Last Housing Risk Score: 0  Physical Activity: Inactive   Days of Exercise per Week: 0 days   Minutes of Exercise per Session: 0 min  Social Connections: Moderately Isolated   Frequency of Communication with Friends and Family: More than three times a week   Frequency of Social Gatherings with Friends and Family: Twice a week   Attends Religious Services: 1 to 4 times per year   Active Member of [employer] or Organizations: No   Attends Music therapist: Never   Marital Status: Never married  Stress: Not on file  Tobacco Use: Medium Risk   Smoking Tobacco Use: Former   Smokeless Tobacco Use: Never  Transportation Needs: No Data processing manager (Medical): No   Lack of Transportation (Non-Medical): No    CCM Care Plan  Allergies  Allergen Reactions   Bactrim [Sulfamethoxazole-Trimethoprim] Hives, Itching and Other (See Comments)    Bruised like areas on body   Ciprofloxacin Other (See Comments)    Body aches   Codeine Nausea Only    REACTION: nausea   Erythromycin Nausea Only    REACTION: nausea   Levofloxacin Other (See Comments)    REACTION: aches    Medications Reviewed Today     Reviewed by Edythe Clarity, [facility] (Pharmacist) on 09/30/20 at 1349  Med List Status: <None>   Medication Order Taking? Sig Documenting Provider Last Dose Status Informant  acetaminophen (TYLENOL) 650 MG CR tablet [id number] Yes every 8 (eight) hours as needed for pain.  [provider] Taking Active Self  albuterol (PROAIR HFA) 108 (90 Base) MCG/ACT inhaler [id number] Yes Inhale 2 puffs into the lungs every 6 (six) hours as needed for wheezing or shortness of breath. Tanda Rockers, MD Taking Active   ALPRAZolam Duanne Moron) 0.25 MG tablet [id number] Yes TAKE 1 TABLET BY MOUTH AT BEDTIME AS NEEDED FOR SLEEP Marin Olp, MD Taking Active   amitriptyline (ELAVIL) 50 MG tablet [id number] Yes TAKE 1 TABLET BY MOUTH AT BEDTIME Marin Olp, MD Taking Active   aspirin EC 81 MG tablet [id number] Yes Take 81 mg by mouth daily. [provider] Taking Active   atorvastatin (LIPITOR) 80 MG tablet [id number] Yes TAKE 1 TABLET(80 MG) BY MOUTH DAILY Stanford Breed Denice Bors, MD Taking Active   azithromycin (ZITHROMAX) 250 MG tablet [id number] Yes TAKE 1 TABLET(250 MG) BY MOUTH DAILY Tanda Rockers, MD Taking Active   Dextromethorphan-Guaifenesin [facility] DM) 30-600 MG TB12 [id number] Yes Take 1 tablet by mouth daily. Patient takes 1243m [provider] Taking Active   DULERA 200-5 MCG/ACT AERO [id number] INHALE 2 PUFFS INTO THE LUNGS EVERY MORNING AND EVERY NIGHT AT BEDTIME WTanda Rockers MD Taking Active   Melatonin 5 MG TABS [id number] Take 5 mg by mouth at bedtime. [provider] Taking Active   metFORMIN (GLUCOPHAGE) 500 MG tablet [id number] TAKE 1 TABLET(500 MG) BY MOUTH TWICE DAILY WITH A MEAL HMarin Olp MD Taking Active   Multiple Vitamin (MULTIVITAMIN) tablet [id number] Take 1 tablet by mouth daily. [provider] Taking Active   nebivolol (BYSTOLIC) 2.5 MG tablet [id number] Take 1 tablet (2.5 mg total) by mouth daily. CLelon Perla MD Taking Active   Probiotic Product ([facility] CAPS [id number] Take 1 capsule by mouth daily. [provider] Taking Active   Respiratory Therapy Supplies (FLUTTER) DEVI [id number] Use as directed WTanda Rockers MD Taking Active Self  SYNTHROID 75 MCG tablet  [id number] Yes TAKE 1 TABLET(75 MCG) BY MOUTH DAILY BEFORE AND BREAKFAST Marin Olp, MD Taking Active   telmisartan (MICARDIS) 80 MG tablet [id number] Yes TAKE 1 TABLET(80 MG) BY MOUTH DAILY Stanford Breed Denice Bors, MD Taking Active   VITAMIN D, CHOLECALCIFEROL, PO [id number] Yes Take by mouth. [provider] Taking Active             Patient Active Problem List   Diagnosis Date Noted   Aortic atherosclerosis ([place]) 05/28/2020   Senile purpura ([place]) 05/15/2019   Insulin resistance 09/07/2017   Cystitis 11/05/2016   Cerebrovascular disease 01/31/2015   Former smoker 08/05/2014   Hyperglycemia 08/05/2014   Constipation 05/02/2014   History of colonic polyps 05/02/2014   GERD (gastroesophageal reflux disease) 02/24/2014   Hemoptysis 02/24/2014   Benign paroxysmal positional vertigo 04/18/2013   Insomnia 02/12/2013   Diverticulitis 12/21/2012   Nocturnal hypoxemia 12/12/2012   Fibromyalgia 12/28/2011   Osteopenia 08/14/2011   Hypothyroidism 04/06/2010   MAI (mycobacterium avium-intracellulare) ([place]) 11/19/2009   Hyperlipemia 07/03/2007   Essential hypertension 07/03/2007   CAD (coronary artery disease) s/p CABG 07/03/2007   Obstructive bronchiectasis ([place]) with GOLD II/III criteria 07/03/2007    Immunization History  Administered Date(s) Administered   H1N1 01/02/2008   Influenza Split 10/14/2010, 10/07/2011   Influenza, High Dose Seasonal PF 11/05/2016   Influenza,inj,Quad PF,6+ Mos 10/02/2012, 10/16/2015, 11/07/2017, 10/05/2018, 10/30/2019   Influenza-Unspecified 09/25/2013, 10/15/2014, 11/07/2017   Moderna Sars-Covid-2 Vaccination 02/09/2019, 03/12/2019, 09/05/2019, 05/12/2020   PNEUMOCOCCAL CONJUGATE-20 08/27/2020   Pneumococcal Conjugate-13 08/14/2013   Pneumococcal Polysaccharide-23 10/29/2011   Td 05/15/2019   Zoster Recombinat (Shingrix) 06/25/2017, 09/30/2017    Conditions to be addressed/monitored:  Hypertension and Hyperlipidemia  Care Plan :  General Pharmacy (Adult)  Updates made by Edythe Clarity, RPH since 09/30/2020 12:00 AM     Problem: HTN, HLD   Priority: High  Onset Date: 09/30/2020     Long-Range Goal: Patient-Specific Goal   Start Date: 09/30/2020  Expected End Date: 03/30/2021  This Visit's Progress: On track  Priority: High  Note:   Current Barriers:  No home BP monitoring Med adherence DM - metformin  Pharmacist Clinical Goal(s):  Patient will achieve improvement in adherence as evidenced by [facility] through collaboration with PharmD and provider.   Interventions: 1:1 collaboration with Marin Olp, MD regarding development and update of comprehensive plan of care as evidenced by provider attestation and co-signature Inter-disciplinary care team collaboration (see longitudinal plan of care) Comprehensive medication review performed; medication list updated in electronic medical record  Hypertension (BP goal <140/90) -Controlled -Current treatment: Telmisartan 89m daily Nebivolol 2.550m-Medications previously tried: Valsartan, irbesartan, losartan  -Current home readings: not checking at home  -Denies hypotensive/hypertensive symptoms -Educated on BP goals and benefits of medications for prevention of heart attack, stroke and kidney damage; Exercise goal of 150 minutes per week; Importance of home blood pressure monitoring; Symptoms of hypotension and importance of maintaining adequate hydration; -Counseled to monitor BP at home periodically, document, and provide log at future appointments -Recommended to continue current medication  Hyperlipidemia: (LDL goal < 100) -Controlled -Current treatment: Atorvastatin 206maily -Medications previously tried: none noted  -Most recent LDL is excellent -Educated on Cholesterol goals;  Benefits of statin for ASCVD risk reduction; Importance of limiting foods high in cholesterol; -Recommended to continue current medication  Patient Goals/Self-Care  Activities Patient will:  - take medications as prescribed check blood pressure periodically, document, and provide at future appointments  Follow Up Plan: The care  management team will reach out to the patient again over the next 180 days.         Medication Assistance: None required.  Patient affirms current coverage meets needs.  Compliance/Adherence/Medication fill history: Care Gaps: MAD - patient notified that Metformin rx is at pharmacy ready to p/u  Star-Rating Drugs: Metformin 531m 07/05/20 90ds Atorvastatin 248m07/27/22 39ds  [facility]: [phone]: [phone] [place]Su[address]hone: [phone]: [phone] pill box? No - Ziploc baggies for AM and PM Pt endorses 100% compliance  We discussed: Benefits of medication synchronization, packaging and delivery as well as enhanced pharmacist oversight with [employer]. Patient decided to: Continue current medication management strategy  Care Plan and Follow Up Patient Decision:  Patient agrees to Care Plan and Follow-up.  Plan: The care management team will reach out to the patient again over the next 180 days.  ChBeverly MilchPharmD Clinical Pharmacist ([phone]

## 2020-09-30 ENCOUNTER — Ambulatory Visit (INDEPENDENT_AMBULATORY_CARE_PROVIDER_SITE_OTHER): Payer: Medicare Other | Admitting: Pharmacist

## 2020-09-30 ENCOUNTER — Other Ambulatory Visit: Payer: Self-pay

## 2020-09-30 ENCOUNTER — Encounter: Payer: Self-pay | Admitting: Family Medicine

## 2020-09-30 DIAGNOSIS — E785 Hyperlipidemia, unspecified: Secondary | ICD-10-CM

## 2020-09-30 DIAGNOSIS — I10 Essential (primary) hypertension: Secondary | ICD-10-CM

## 2020-09-30 NOTE — Patient Instructions (Addendum)
Visit Information   Goals Addressed             This Visit's Progress    Track and Manage My Blood Pressure-Hypertension       Timeframe:  Long-Range Goal Priority:  High Start Date:    09/30/20                         Expected End Date:  03/30/21                     Follow Up Date 12/30/20    - check blood pressure weekly - choose a place to take my blood pressure (home, clinic or office, retail store) - write blood pressure results in a log or diary    Why is this important?   You won't feel high blood pressure, but it can still hurt your blood vessels.  High blood pressure can cause heart or kidney problems. It can also cause a stroke.  Making lifestyle changes like losing a little weight or eating less salt will help.  Checking your blood pressure at home and at different times of the day can help to control blood pressure.  If the doctor prescribes medicine remember to take it the way the doctor ordered.  Call the office if you cannot afford the medicine or if there are questions about it.     Notes:        Patient Care Plan: General Pharmacy (Adult)     Problem Identified: HTN, HLD   Priority: High  Onset Date: 09/30/2020     Long-Range Goal: Patient-Specific Goal   Start Date: 09/30/2020  Expected End Date: 03/30/2021  This Visit's Progress: On track  Priority: High  Note:   Current Barriers:  No home BP monitoring Med adherence DM - metformin  Pharmacist Clinical Goal(s):  Patient will achieve improvement in adherence as evidenced by [facility] through collaboration with PharmD and provider.   Interventions: 1:1 collaboration with Marin Olp, MD regarding development and update of comprehensive plan of care as evidenced by provider attestation and co-signature Inter-disciplinary care team collaboration (see longitudinal plan of care) Comprehensive medication review performed; medication list updated in electronic medical record  Hypertension (BP goal  <140/90) -Controlled -Current treatment: Telmisartan '80mg'$  daily Nebivolol 2.'5mg'$  -Medications previously tried: Valsartan, irbesartan, losartan  -Current home readings: not checking at home  -Denies hypotensive/hypertensive symptoms -Educated on BP goals and benefits of medications for prevention of heart attack, stroke and kidney damage; Exercise goal of 150 minutes per week; Importance of home blood pressure monitoring; Symptoms of hypotension and importance of maintaining adequate hydration; -Counseled to monitor BP at home periodically, document, and provide log at future appointments -Recommended to continue current medication  Hyperlipidemia: (LDL goal < 100) -Controlled -Current treatment: Atorvastatin '20mg'$  daily -Medications previously tried: none noted  -Most recent LDL is excellent -Educated on Cholesterol goals;  Benefits of statin for ASCVD risk reduction; Importance of limiting foods high in cholesterol; -Recommended to continue current medication  Patient Goals/Self-Care Activities Patient will:  - take medications as prescribed check blood pressure periodically, document, and provide at future appointments  Follow Up Plan: The care management team will reach out to the patient again over the next 180 days.         Patient verbalizes understanding of instructions provided today and agrees to view in [facility].  Telephone follow up appointment with pharmacy team member scheduled for: 6 months  Belinda Day  Belinda Day, [place]

## 2020-10-03 DIAGNOSIS — M65341 Trigger finger, right ring finger: Secondary | ICD-10-CM | POA: Diagnosis not present

## 2020-10-03 DIAGNOSIS — M65331 Trigger finger, right middle finger: Secondary | ICD-10-CM | POA: Diagnosis not present

## 2020-10-03 DIAGNOSIS — M65332 Trigger finger, left middle finger: Secondary | ICD-10-CM | POA: Diagnosis not present

## 2020-10-03 DIAGNOSIS — G8918 Other acute postprocedural pain: Secondary | ICD-10-CM | POA: Diagnosis not present

## 2020-10-03 DIAGNOSIS — M65351 Trigger finger, right little finger: Secondary | ICD-10-CM | POA: Diagnosis not present

## 2020-10-03 DIAGNOSIS — M19041 Primary osteoarthritis, right hand: Secondary | ICD-10-CM | POA: Diagnosis not present

## 2020-10-13 DIAGNOSIS — H52203 Unspecified astigmatism, bilateral: Secondary | ICD-10-CM | POA: Diagnosis not present

## 2020-10-13 DIAGNOSIS — E119 Type 2 diabetes mellitus without complications: Secondary | ICD-10-CM | POA: Diagnosis not present

## 2020-10-13 DIAGNOSIS — H5203 Hypermetropia, bilateral: Secondary | ICD-10-CM | POA: Diagnosis not present

## 2020-10-16 DIAGNOSIS — M79641 Pain in right hand: Secondary | ICD-10-CM | POA: Diagnosis not present

## 2020-10-24 ENCOUNTER — Other Ambulatory Visit: Payer: Self-pay | Admitting: Cardiology

## 2020-10-24 DIAGNOSIS — I10 Essential (primary) hypertension: Secondary | ICD-10-CM | POA: Diagnosis not present

## 2020-10-24 DIAGNOSIS — E785 Hyperlipidemia, unspecified: Secondary | ICD-10-CM | POA: Diagnosis not present

## 2020-10-28 ENCOUNTER — Other Ambulatory Visit: Payer: Self-pay

## 2020-10-28 ENCOUNTER — Ambulatory Visit (INDEPENDENT_AMBULATORY_CARE_PROVIDER_SITE_OTHER): Payer: Medicare Other

## 2020-10-28 DIAGNOSIS — M79641 Pain in right hand: Secondary | ICD-10-CM | POA: Diagnosis not present

## 2020-10-28 DIAGNOSIS — Z23 Encounter for immunization: Secondary | ICD-10-CM

## 2020-11-06 DIAGNOSIS — M79644 Pain in right finger(s): Secondary | ICD-10-CM | POA: Diagnosis not present

## 2020-11-06 DIAGNOSIS — M79641 Pain in right hand: Secondary | ICD-10-CM | POA: Diagnosis not present

## 2020-11-06 DIAGNOSIS — Z4789 Encounter for other orthopedic aftercare: Secondary | ICD-10-CM | POA: Diagnosis not present

## 2020-11-06 DIAGNOSIS — M65331 Trigger finger, right middle finger: Secondary | ICD-10-CM | POA: Diagnosis not present

## 2020-11-06 DIAGNOSIS — M65849 Other synovitis and tenosynovitis, unspecified hand: Secondary | ICD-10-CM | POA: Diagnosis not present

## 2020-11-11 DIAGNOSIS — M65849 Other synovitis and tenosynovitis, unspecified hand: Secondary | ICD-10-CM | POA: Diagnosis not present

## 2020-11-11 DIAGNOSIS — M79641 Pain in right hand: Secondary | ICD-10-CM | POA: Diagnosis not present

## 2020-11-18 DIAGNOSIS — M79641 Pain in right hand: Secondary | ICD-10-CM | POA: Diagnosis not present

## 2020-11-18 DIAGNOSIS — M65849 Other synovitis and tenosynovitis, unspecified hand: Secondary | ICD-10-CM | POA: Diagnosis not present

## 2020-11-20 ENCOUNTER — Other Ambulatory Visit: Payer: Self-pay | Admitting: Cardiology

## 2020-11-20 DIAGNOSIS — I10 Essential (primary) hypertension: Secondary | ICD-10-CM

## 2020-11-21 NOTE — Progress Notes (Signed)
Phone [phone] In person visit   Subjective:   Belinda Day is a 75 y.o. year old very pleasant female patient who presents for/with See problem oriented charting Chief Complaint  Patient presents with   Follow-up   This visit occurred during the SARS-CoV-2 public health emergency.  Safety protocols were in place, including screening questions prior to the visit, additional usage of staff PPE, and extensive cleaning of exam room while observing appropriate contact time as indicated for disinfecting solutions.   Past Medical History-  Patient Active Problem List   Diagnosis Date Noted   Osteopenia 08/14/2011    Priority: High   CAD (coronary artery disease) s/p CABG 07/03/2007    Priority: High   Insulin resistance 09/07/2017    Priority: Medium    Hyperglycemia 08/05/2014    Priority: Medium    Insomnia 02/12/2013    Priority: Medium    Nocturnal hypoxemia 12/12/2012    Priority: Medium    Fibromyalgia 12/28/2011    Priority: Medium    Hypothyroidism 04/06/2010    Priority: Medium    MAI (mycobacterium avium-intracellulare) ([place]) 11/19/2009    Priority: Medium    Hyperlipemia 07/03/2007    Priority: Medium    Essential hypertension 07/03/2007    Priority: Medium    Obstructive bronchiectasis ([place]) with GOLD II/III criteria 07/03/2007    Priority: Medium    Senile purpura ([place]) 05/15/2019    Priority: Low   Cystitis 11/05/2016    Priority: Low   Former smoker 08/05/2014    Priority: Low   Constipation 05/02/2014    Priority: Low   History of colonic polyps 05/02/2014    Priority: Low   GERD (gastroesophageal reflux disease) 02/24/2014    Priority: Low   Hemoptysis 02/24/2014    Priority: Low   Benign paroxysmal positional vertigo 04/18/2013    Priority: Low   Diverticulitis 12/21/2012    Priority: Low   Aortic atherosclerosis ([place]) 05/28/2020   Cerebrovascular disease 01/31/2015    Medications- reviewed and updated Current Outpatient Medications   Medication Sig Dispense Refill   acetaminophen (TYLENOL) 650 MG CR tablet every 8 (eight) hours as needed for pain.      albuterol (PROAIR HFA) 108 (90 Base) MCG/ACT inhaler Inhale 2 puffs into the lungs every 6 (six) hours as needed for wheezing or shortness of breath. 1 each 1   ALPRAZolam (XANAX) 0.25 MG tablet TAKE 1 TABLET BY MOUTH AT BEDTIME AS NEEDED FOR SLEEP 90 tablet 1   amitriptyline (ELAVIL) 50 MG tablet TAKE 1 TABLET BY MOUTH AT BEDTIME 90 tablet 3   aspirin EC 81 MG tablet Take 81 mg by mouth daily.     atorvastatin (LIPITOR) 80 MG tablet TAKE 1 TABLET(80 MG) BY MOUTH DAILY 90 tablet 3   azithromycin (ZITHROMAX) 250 MG tablet TAKE 1 TABLET(250 MG) BY MOUTH DAILY 30 tablet 5   Dextromethorphan-Guaifenesin (MUCINEX DM) 30-600 MG TB12 Take 1 tablet by mouth daily. Patient takes 1200mg      DULERA 200-5 MCG/ACT AERO INHALE 2 PUFFS INTO THE LUNGS EVERY MORNING AND EVERY NIGHT AT BEDTIME 13 g 6   Melatonin 5 MG TABS Take 5 mg by mouth at bedtime.     metFORMIN (GLUCOPHAGE) 500 MG tablet TAKE 1 TABLET(500 MG) BY MOUTH TWICE DAILY WITH A MEAL 180 tablet 3   Multiple Vitamin (MULTIVITAMIN) tablet Take 1 tablet by mouth daily.     nebivolol (BYSTOLIC) 2.5 MG tablet Take 1 tablet (2.5 mg total) by mouth daily. [place]  tablet 2   Respiratory Therapy Supplies (FLUTTER) DEVI Use as directed 1 each 0   SYNTHROID 75 MCG tablet TAKE 1 TABLET(75 MCG) BY MOUTH DAILY BEFORE AND BREAKFAST 90 tablet 3   telmisartan (MICARDIS) 80 MG tablet TAKE 1 TABLET(80 MG) BY MOUTH DAILY 90 tablet 3   VITAMIN D, CHOLECALCIFEROL, PO Take by mouth.     Probiotic Product (RESTORA) CAPS Take 1 capsule by mouth daily. (Patient not taking: Reported on 11/28/2020)     No current facility-administered medications for this visit.     Objective:  BP 124/68   Pulse 74   Temp (!) 97.3 F (36.3 C)   Ht 5' 0.98" (1.549 m)   Wt 109 lb 6.4 oz (49.6 kg)   SpO2 97%   BMI 20.68 kg/m  Gen: NAD, resting comfortably CV: RRR no  murmurs rubs or gallops Lungs: diffuse rhonchi and wheeze stable with baseline Abdomen: soft/nontender/nondistended/normal bowel sounds. Ext: no edema Skin: warm, dry Msk: has right middle finger wrapped and soft sleeve on right hand after prior surgery    Assessment and Plan   #Fibromyalgia/insomnia S: Compliant with amitriptyline 50 mg (had worsening symptoms on lower dose in past by prior PCP)-due to age, her pharmacist has mentioned coming off amitriptyline but she had been on since 1988 and preferred to continue.  Used Tylenol as needed. -Used Xanax to help with sleep.  Very difficult to fall asleep if she does not take this. Fibromyalgia symptoms worsened with poor sleep A/P:stable overall- continue current meds. Not drowsy next day on either med- no instability if gets up at night -if every has falls/dizziness we may need to reapproach both amitriptyline and xanax but doing well for now   #Pulmonary-MAI and obstructive bronchiectasis-followed with Dr. Melvyn Novas S: Patient followed with pulmonary clinic.  On regular Dulera.  Albuterol as needed.  Uses azithromycin daily. Hasnt needed flutter vvalve lately. Still doing mucinex. Still gets cough from this A/P: relatively stable- continue current meds    #CAD status post CABG-followed with Dr. Jones Broom #Aortic atherosclerosis noted on CT S: Compliant with atorvastatin 80 mg and aspirin 81 mg.  LDL goal under 70. No chest pain still. SOB related to bronchiectasis- stable. Also cough with this Lab Results  Component Value Date   CHOL 147 05/28/2020   HDL 65.00 05/28/2020   LDLCALC 67 05/28/2020   LDLDIRECT 47 11/27/2019   TRIG 76.0 05/28/2020   CHOLHDL 2 05/28/2020   A/P: CAD asymptomatic-continue current medication.  Hyperlipidemia has been at goal-check direct LDL.  For aortic atherosclerosis goal LDL under 70-same treatment of CAD   #Hypertension S: Compliant with Bystolic 2.5 mg daily, telmisartan 80mg  daily BP  Readings from Last 3 Encounters:  11/28/20 124/68  08/25/20 126/64  05/28/20 116/60  A/P: Controlled. Continue current medications.    #Hypothyroidism S: Compliant with Synthroid 75 mcg daily Lab Results  Component Value Date   TSH 0.55 05/28/2020   A/P:hopefully stable- update tsh today. Continue current meds for now   #Hyperglycemia/prediabetes S: Compliant with metformin 500 mg twice a day. Does take glucerna Exercise and diet- doing a lot of leaves lately Lab Results  Component Value Date   HGBA1C 6.1 05/28/2020   HGBA1C 5.9 (H) 11/27/2019   HGBA1C 6.1 05/08/2019   A/P: hopefully stable- update a1c today. Continue current meds for now   #Osteopenia-technically osteoporosis since had been on Fosamax in the past S: Takes calcium and vitamin D alone and all areas were stable-"02/16/17 DEXA. -2.2 at  R femur neck-23% 10 year risk and hip fracture risk 12%.".  Numbers improved by 0.1 on femur necks in 2021 A/P: overall stable- repeat 2023 or 2024 - continue calcium and vitamin D    #Constipation- recently doing ok on milk of magnesium- some gagging. In past patient compliant with sparing Linzess- was costly and may have been contributed to side pain   # trigger finger- Dr. Amedeo Plenty did trigger finger release x3- working with PT and that has been hard. Pain much better overall but PT still hard.  - also for hand arthritis - still has one on left hand on hold- and will need arthritis surgery on that hand as well   #abdominal pain- no recurrent issues after antibiotics through [facility].   Recommended follow up: No follow-ups on file. Future Appointments  Date Time Provider [place]  01/22/2021  9:45 AM Warren Lacy, PA-C [facility]  04/14/2021  1:30 PM [facility] CCM PHARMACIST [facility]  06/15/2021  1:00 PM [facility] HEALTH COACH [facility]  08/25/2021  9:30 AM Melvyn Novas, Christena Deem, MD [facility] None    Lab/Order associations:   ICD-10-CM   1. Insomnia,  unspecified type  G47.00     2. Fibromyalgia  M79.7     3. Coronary artery disease due to lipid rich plaque  I25.10    I25.83     4. Obstructive bronchiectasis (HCC) with GOLD II/III criteria  J47.9     5. Aortic atherosclerosis (HCC)  I70.0     6. Hyperlipidemia, unspecified hyperlipidemia type  E78.5 CBC with Differential/Platelet    Comprehensive metabolic panel    LDL cholesterol, direct    7. Essential hypertension  I10     8. Hyperglycemia  R73.9 Hemoglobin A1c    9. Osteopenia of right hip  M85.851     10. Hypothyroidism, unspecified type  E03.9 TSH     No orders of the defined types were placed in this encounter.  I,Jada Bradford,acting as a scribe for Garret Reddish, MD.,have documented all relevant documentation on the behalf of Garret Reddish, MD,as directed by  Garret Reddish, MD while in the presence of Garret Reddish, MD.  I, Garret Reddish, MD, have reviewed all documentation for this visit. The documentation on 11/28/20 for the exam, diagnosis, procedures, and orders are all accurate and complete.  Return precautions advised.  Garret Reddish, MD

## 2020-11-26 ENCOUNTER — Encounter: Payer: Self-pay | Admitting: Family Medicine

## 2020-11-28 ENCOUNTER — Other Ambulatory Visit: Payer: Self-pay

## 2020-11-28 ENCOUNTER — Encounter: Payer: Self-pay | Admitting: Family Medicine

## 2020-11-28 ENCOUNTER — Ambulatory Visit (INDEPENDENT_AMBULATORY_CARE_PROVIDER_SITE_OTHER): Payer: Medicare Other | Admitting: Family Medicine

## 2020-11-28 VITALS — BP 124/68 | HR 74 | Temp 97.3°F | Ht 60.98 in | Wt 109.4 lb

## 2020-11-28 DIAGNOSIS — J479 Bronchiectasis, uncomplicated: Secondary | ICD-10-CM | POA: Diagnosis not present

## 2020-11-28 DIAGNOSIS — M85851 Other specified disorders of bone density and structure, right thigh: Secondary | ICD-10-CM

## 2020-11-28 DIAGNOSIS — G4700 Insomnia, unspecified: Secondary | ICD-10-CM

## 2020-11-28 DIAGNOSIS — E039 Hypothyroidism, unspecified: Secondary | ICD-10-CM | POA: Diagnosis not present

## 2020-11-28 DIAGNOSIS — R739 Hyperglycemia, unspecified: Secondary | ICD-10-CM | POA: Diagnosis not present

## 2020-11-28 DIAGNOSIS — I10 Essential (primary) hypertension: Secondary | ICD-10-CM

## 2020-11-28 DIAGNOSIS — M797 Fibromyalgia: Secondary | ICD-10-CM | POA: Diagnosis not present

## 2020-11-28 DIAGNOSIS — I2583 Coronary atherosclerosis due to lipid rich plaque: Secondary | ICD-10-CM

## 2020-11-28 DIAGNOSIS — E785 Hyperlipidemia, unspecified: Secondary | ICD-10-CM | POA: Diagnosis not present

## 2020-11-28 DIAGNOSIS — I700 Atherosclerosis of aorta: Secondary | ICD-10-CM

## 2020-11-28 DIAGNOSIS — I2510 Atherosclerotic heart disease of native coronary artery without angina pectoris: Secondary | ICD-10-CM | POA: Diagnosis not present

## 2020-11-28 LAB — CBC WITH DIFFERENTIAL/PLATELET
Basophils Absolute: 0 10*3/uL (ref 0.0–0.1)
Basophils Relative: 0.5 % (ref 0.0–3.0)
Eosinophils Absolute: 0 10*3/uL (ref 0.0–0.7)
Eosinophils Relative: 0.5 % (ref 0.0–5.0)
HCT: 39.9 % (ref 36.0–46.0)
Hemoglobin: 13.3 g/dL (ref 12.0–15.0)
Lymphocytes Relative: 14.3 % (ref 12.0–46.0)
Lymphs Abs: 1 10*3/uL (ref 0.7–4.0)
MCHC: 33.4 g/dL (ref 30.0–36.0)
MCV: 96.6 fl (ref 78.0–100.0)
Monocytes Absolute: 0.4 10*3/uL (ref 0.1–1.0)
Monocytes Relative: 6.1 % (ref 3.0–12.0)
Neutro Abs: 5.3 10*3/uL (ref 1.4–7.7)
Neutrophils Relative %: 78.6 % — ABNORMAL HIGH (ref 43.0–77.0)
Platelets: 276 10*3/uL (ref 150.0–400.0)
RBC: 4.14 Mil/uL (ref 3.87–5.11)
RDW: 12.9 % (ref 11.5–15.5)
WBC: 6.8 10*3/uL (ref 4.0–10.5)

## 2020-11-28 LAB — COMPREHENSIVE METABOLIC PANEL
ALT: 18 U/L (ref 0–35)
AST: 24 U/L (ref 0–37)
Albumin: 4.2 g/dL (ref 3.5–5.2)
Alkaline Phosphatase: 88 U/L (ref 39–117)
BUN: 15 mg/dL (ref 6–23)
CO2: 30 mEq/L (ref 19–32)
Calcium: 9.4 mg/dL (ref 8.4–10.5)
Chloride: 103 mEq/L (ref 96–112)
Creatinine, Ser: 0.82 mg/dL (ref 0.40–1.20)
GFR: 68.87 mL/min (ref >60.00)
Glucose, Bld: 91 mg/dL (ref 70–99)
Potassium: 4 mEq/L (ref 3.5–5.1)
Sodium: 140 mEq/L (ref 135–145)
Total Bilirubin: 0.4 mg/dL (ref 0.2–1.2)
Total Protein: 6.9 g/dL (ref 6.0–8.3)

## 2020-11-28 LAB — HEMOGLOBIN A1C: Hgb A1c MFr Bld: 6.2 % (ref 4.6–6.5)

## 2020-11-28 LAB — TSH: TSH: 0.9 u[IU]/mL (ref 0.35–5.50)

## 2020-11-28 LAB — LDL CHOLESTEROL, DIRECT: Direct LDL: 51 mg/dL

## 2020-11-28 MED ORDER — ALPRAZOLAM 0.25 MG PO TABS: ORAL_TABLET | ORAL | 1 refills | Status: DC

## 2020-11-28 NOTE — Patient Instructions (Addendum)
Please stop by lab before you go If you have mychart- we will send your results within 3 business days of [place] receiving them.  If you do not have mychart- we will call you about results within 5 business days of [place] receiving them.  *please also note that you will see labs on mychart as soon as they post. I will later go in and write notes on them- will say "notes from Dr. Yong Channel"  Glad you are working hard on your recovery! Hope the PT will get easier for you!   Recommended follow up: Return in about 6 months (around 05/28/2021) for physical or sooner if needed.

## 2020-12-03 DIAGNOSIS — M79641 Pain in right hand: Secondary | ICD-10-CM | POA: Diagnosis not present

## 2020-12-03 DIAGNOSIS — M65849 Other synovitis and tenosynovitis, unspecified hand: Secondary | ICD-10-CM | POA: Diagnosis not present

## 2020-12-22 ENCOUNTER — Encounter: Payer: Self-pay | Admitting: Family Medicine

## 2020-12-22 DIAGNOSIS — M79641 Pain in right hand: Secondary | ICD-10-CM | POA: Diagnosis not present

## 2020-12-22 DIAGNOSIS — M65849 Other synovitis and tenosynovitis, unspecified hand: Secondary | ICD-10-CM | POA: Diagnosis not present

## 2020-12-22 DIAGNOSIS — M65351 Trigger finger, right little finger: Secondary | ICD-10-CM | POA: Diagnosis not present

## 2020-12-23 DIAGNOSIS — Z20822 Contact with and (suspected) exposure to covid-19: Secondary | ICD-10-CM | POA: Diagnosis not present

## 2020-12-23 DIAGNOSIS — R059 Cough, unspecified: Secondary | ICD-10-CM | POA: Diagnosis not present

## 2020-12-23 DIAGNOSIS — J470 Bronchiectasis with acute lower respiratory infection: Secondary | ICD-10-CM | POA: Diagnosis not present

## 2020-12-23 DIAGNOSIS — J029 Acute pharyngitis, unspecified: Secondary | ICD-10-CM | POA: Diagnosis not present

## 2020-12-24 DIAGNOSIS — R0781 Pleurodynia: Secondary | ICD-10-CM | POA: Diagnosis not present

## 2021-01-07 DIAGNOSIS — M65849 Other synovitis and tenosynovitis, unspecified hand: Secondary | ICD-10-CM | POA: Diagnosis not present

## 2021-01-07 DIAGNOSIS — M79642 Pain in left hand: Secondary | ICD-10-CM | POA: Diagnosis not present

## 2021-01-07 DIAGNOSIS — M79641 Pain in right hand: Secondary | ICD-10-CM | POA: Diagnosis not present

## 2021-01-13 ENCOUNTER — Other Ambulatory Visit: Payer: Self-pay | Admitting: Internal Medicine

## 2021-01-14 DIAGNOSIS — M79641 Pain in right hand: Secondary | ICD-10-CM | POA: Diagnosis not present

## 2021-01-14 DIAGNOSIS — M65849 Other synovitis and tenosynovitis, unspecified hand: Secondary | ICD-10-CM | POA: Diagnosis not present

## 2021-01-22 ENCOUNTER — Ambulatory Visit: Payer: Medicare Other | Admitting: Physician Assistant

## 2021-01-22 DIAGNOSIS — Z4789 Encounter for other orthopedic aftercare: Secondary | ICD-10-CM | POA: Diagnosis not present

## 2021-01-22 DIAGNOSIS — S62307A Unspecified fracture of fifth metacarpal bone, left hand, initial encounter for closed fracture: Secondary | ICD-10-CM | POA: Diagnosis not present

## 2021-01-22 DIAGNOSIS — M79642 Pain in left hand: Secondary | ICD-10-CM | POA: Diagnosis not present

## 2021-01-30 DIAGNOSIS — L900 Lichen sclerosus et atrophicus: Secondary | ICD-10-CM | POA: Diagnosis not present

## 2021-02-02 NOTE — Progress Notes (Signed)
Cardiology Office Note:    Date:  02/03/2021   ID:  Belinda Day, DOB January 29, 1945, MRN [id number]  PCP:  Belinda Olp, MD [place] Cardiologist: Belinda Ruths, MD   Reason for visit: 1 year follow-up  History of Present Illness:    Belinda Day is a 76 y.o. female with a hx of CAD status post coronary artery bypass in 2002, hypertension, prediabetes, bronchiectasis and dyslipidemia.  She was last seen by Dr. Stanford Day in January 2022 when she was doing well with stable dyspnea secondary to her lung disease.  Today, she is doing well.  She has a chronic cough with her bronchiectasis.  Shortness of breath stable and follows with her pulmonologist regularly.  Not on oxygen.  Chronic occasional brief chest cramping (20 seconds), resolved with position change.  She denies lower extremity edema, PND, lightheadedness, syncope, palpitations and bleeding.  Asked for refill Bystolic.  She lives alone and independently.  She had a fall in December and  has a broken left wrist, in a splint.        Past Medical History:  Diagnosis Date   Bronchiectasis    oxygen at night in the past   CAD (coronary artery disease)    Diverticulitis 2014   GERD (gastroesophageal reflux disease)    History of shingles 04/2012   HTN (hypertension)    Hyperlipidemia    Hypothyroidism    MAI (mycobacterium avium-intracellulare) (HCC)    Neuritis of upper extremity     Past Surgical History:  Procedure Laterality Date   CORONARY ARTERY BYPASS GRAFT  2004   x3 CABG    Current Medications: Current Meds  Medication Sig   acetaminophen (TYLENOL) 650 MG CR tablet every 8 (eight) hours as needed for pain.    albuterol (PROAIR HFA) 108 (90 Base) MCG/ACT inhaler Inhale 2 puffs into the lungs every 6 (six) hours as needed for wheezing or shortness of breath.   ALPRAZolam (XANAX) 0.25 MG tablet TAKE 1 TABLET BY MOUTH AT BEDTIME AS NEEDED FOR SLEEP   amitriptyline (ELAVIL) 50 MG tablet TAKE 1  TABLET BY MOUTH AT BEDTIME   aspirin EC 81 MG tablet Take 81 mg by mouth daily.   atorvastatin (LIPITOR) 80 MG tablet TAKE 1 TABLET(80 MG) BY MOUTH DAILY   azithromycin (ZITHROMAX) 250 MG tablet TAKE 1 TABLET(250 MG) BY MOUTH DAILY   Dextromethorphan-Guaifenesin (MUCINEX DM) 30-600 MG TB12 Take 1 tablet by mouth daily. Patient takes 1200mg    Melatonin 5 MG TABS Take 5 mg by mouth at bedtime.   metFORMIN (GLUCOPHAGE) 500 MG tablet TAKE 1 TABLET(500 MG) BY MOUTH TWICE DAILY WITH A MEAL   mometasone-formoterol (DULERA) 200-5 MCG/ACT AERO INHALE 2 PUFFS INTO THE LUNGS EVERY MORNING AND EVERY NIGHT AT BEDTIME   Multiple Vitamin (MULTIVITAMIN) tablet Take 1 tablet by mouth daily.   Respiratory Therapy Supplies (FLUTTER) DEVI Use as directed   SYNTHROID 75 MCG tablet TAKE 1 TABLET(75 MCG) BY MOUTH DAILY BEFORE AND BREAKFAST   telmisartan (MICARDIS) 80 MG tablet TAKE 1 TABLET(80 MG) BY MOUTH DAILY   VITAMIN D, CHOLECALCIFEROL, PO Take by mouth.   [DISCONTINUED] nebivolol (BYSTOLIC) 2.5 MG tablet Take 1 tablet (2.5 mg total) by mouth daily.     Allergies:   Bactrim [sulfamethoxazole-trimethoprim], Ciprofloxacin, Codeine, Erythromycin, and Levofloxacin   Social History   Socioeconomic History   Marital status: Single    Spouse name: Not on file   Number of children: Not on file   Years  of education: Not on file   Highest education level: Not on file  Occupational History   Occupation: Retired   Tobacco Use   Smoking status: Former    Packs/day: 1.00    Years: 20.00    Pack years: 20.00    Types: Cigarettes    Quit date: 01/25/1970    Years since quitting: 51.0   Smokeless tobacco: Never  Substance and Sexual Activity   Alcohol use: No   Drug use: No   Sexual activity: Not on file  Other Topics Concern   Not on file  Social History Narrative   Family: Single never married, no children, cat and rehabs turtles and tortoise   Went to [employer] in [employer] and  social work, some business courses at [employer], EMT for 6 years.    LIves alone. Completely independent.    Lives in retirement community.       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      Hobbies: kayaking, gardening- mows own lawn   Social Determinants of Radio broadcast assistant Strain: Low Risk    Difficulty of Paying Living Expenses: Not hard at [employer] Insecurity: No Food Insecurity   Worried About Charity fundraiser in the Last Year: Never true   Arboriculturist in the Last Year: Never true  Transportation Needs: No Data processing manager (Medical): No   Lack of Transportation (Non-Medical): No  Physical Activity: Inactive   Days of Exercise per Week: 0 days   Minutes of Exercise per Session: 0 min  Stress: Not on file  Social Connections: Moderately Isolated   Frequency of Communication with Friends and Family: More than three times a week   Frequency of Social Gatherings with Friends and Family: Twice a week   Attends Religious Services: 1 to 4 times per year   Active Member of [employer] or Organizations: No   Attends Music therapist: Never   Marital Status: Never married     Family History: The patient's family history includes Arthritis in her father; Colon cancer in her father and mother; Diabetes in her mother; Heart disease in her father and mother; Hyperlipidemia in her father and mother; Hypertension in her father and mother; Stroke in her father and mother. There is no history of Atopy.  ROS:   Please see the history of present illness.     EKGs/Labs/Other Studies Reviewed:    EKG:  The ekg ordered today demonstrates normal sinus rhythm with short PR, heart rate 77, PR interval 104 ms, QRS duration 86 ms.  Recent Labs: 11/28/2020: ALT 18; BUN 15; Creatinine, Ser 0.82; Hemoglobin 13.3; Platelets 276.0; Potassium 4.0; Sodium 140; TSH 0.90   Recent Lipid Panel Lab Results  Component Value  Date/Time   CHOL 147 05/28/2020 01:52 PM   TRIG 76.0 05/28/2020 01:52 PM   HDL 65.00 05/28/2020 01:52 PM   LDLCALC 67 05/28/2020 01:52 PM   LDLDIRECT 51.0 11/28/2020 11:21 AM    Physical Exam:    VS:  BP 122/66    Pulse 77    Ht 5\' 1"  (1.549 m)    Wt 110 lb (49.9 kg)    SpO2 94%    BMI 20.78 kg/m    No data found.  Wt Readings from Last 3 Encounters:  02/03/21 110 lb (49.9 kg)  11/28/20 109 lb 6.4 oz (49.6 kg)  08/25/20 110 lb 9.6 oz (50.2 kg)  GEN:  Well nourished, well developed in no acute distress HEENT: Normal NECK: No JVD; No carotid bruits CARDIAC: RRR, no murmurs, rubs, gallops RESPIRATORY:  + rhonchi and wheezing ABDOMEN: Soft, non-tender, non-distended MUSCULOSKELETAL: No edema; left wrist in splint SKIN: Warm and dry NEUROLOGIC:  Alert and oriented PSYCHIATRIC:  Normal affect     ASSESSMENT AND PLAN   Coronary artery disease, stable -No angina.  She had dyspnea on exertion prior to her CABG. -Blood pressure, cholesterol and pre-diabetes well controlled. -Continue beta-blocker, statin and aspirin therapy.  Hypertension, well controlled -Continue Bystolic and Micardis.  Last renal function normal. -Goal BP is <130/80.  Recommend DASH diet (high in vegetables, fruits, low-fat dairy products, whole grains, poultry, fish, and nuts and low in sweets, sugar-sweetened beverages, and red meats), salt restriction and increase physical activity.  Hyperlipidemia with goal LDL less than 70 -LDL 67 in May 2022.  Continue Lipitor 80 mg daily  Disposition - Follow-up in 1 year with Dr. Stanford Day.  Refilled Bystolic.         Medication Adjustments/Labs and Tests Ordered: Current medicines are reviewed at length with the patient today.  Concerns regarding medicines are outlined above.  Orders Placed This Encounter  Procedures   EKG 12-Lead   Meds ordered this encounter  Medications   nebivolol (BYSTOLIC) 2.5 MG tablet    Sig: Take 1 tablet (2.5 mg total) by  mouth daily.    Dispense:  90 tablet    Refill:  2    Patient Instructions  Medication Instructions:  No Changes  *If you need a refill on your cardiac medications before your next appointment, please call your pharmacy*   Lab Work: No Labs If you have labs (blood work) drawn today and your tests are completely normal, you will receive your results only by: [place] (if you have MyChart) OR A paper copy in the mail If you have any lab test that is abnormal or we need to change your treatment, we will call you to review the results.   Testing/Procedures: No Testing   Follow-Up: At [facility], you and your health needs are our priority.  As part of our continuing mission to provide you with exceptional heart care, we have created designated Provider Care Teams.  These Care Teams include your primary Cardiologist (physician) and Advanced Practice Providers (APPs -  Physician Assistants and Nurse Practitioners) who all work together to provide you with the care you need, when you need it.  We recommend signing up for the patient portal called "MyChart".  Sign up information is provided on this After Visit Summary.  MyChart is used to connect with patients for Virtual Visits (Telemedicine).  Patients are able to view lab/test results, encounter notes, upcoming appointments, etc.  Non-urgent messages can be sent to your provider as well.   To learn more about what you can do with MyChart, go to [email or web address].    Your next appointment:   1 year(s)  The format for your next appointment:   In Person  Provider:   Kirk Ruths, MD         Signed, Warren Lacy, PA-C  02/03/2021 10:54 AM    [place]

## 2021-02-03 ENCOUNTER — Ambulatory Visit: Payer: Medicare Other | Admitting: Physician Assistant

## 2021-02-03 ENCOUNTER — Other Ambulatory Visit: Payer: Self-pay

## 2021-02-03 ENCOUNTER — Encounter: Payer: Self-pay | Admitting: Physician Assistant

## 2021-02-03 VITALS — BP 122/66 | HR 77 | Ht 61.0 in | Wt 110.0 lb

## 2021-02-03 DIAGNOSIS — I10 Essential (primary) hypertension: Secondary | ICD-10-CM

## 2021-02-03 DIAGNOSIS — E7800 Pure hypercholesterolemia, unspecified: Secondary | ICD-10-CM | POA: Diagnosis not present

## 2021-02-03 DIAGNOSIS — I2510 Atherosclerotic heart disease of native coronary artery without angina pectoris: Secondary | ICD-10-CM | POA: Diagnosis not present

## 2021-02-03 MED ORDER — NEBIVOLOL HCL 2.5 MG PO TABS: 2.5000 mg | ORAL_TABLET | Freq: Every day | ORAL | 2 refills | Status: DC

## 2021-02-03 NOTE — Patient Instructions (Signed)
Medication Instructions:  No Changes *If you need a refill on your cardiac medications before your next appointment, please call your pharmacy*   Lab Work: No Labs If you have labs (blood work) drawn today and your tests are completely normal, you will receive your results only by: . MyChart Message (if you have MyChart) OR . A paper copy in the mail If you have any lab test that is abnormal or we need to change your treatment, we will call you to review the results.   Testing/Procedures: No Testing   Follow-Up: At [facility], you and your health needs are our priority.  As part of our continuing mission to provide you with exceptional heart care, we have created designated Provider Care Teams.  These Care Teams include your primary Cardiologist (physician) and Advanced Practice Providers (APPs -  Physician Assistants and Nurse Practitioners) who all work together to provide you with the care you need, when you need it.  We recommend signing up for the patient portal called "MyChart".  Sign up information is provided on this After Visit Summary.  MyChart is used to connect with patients for Virtual Visits (Telemedicine).  Patients are able to view lab/test results, encounter notes, upcoming appointments, etc.  Non-urgent messages can be sent to your provider as well.   To learn more about what you can do with MyChart, go to [email or web address].    Your next appointment:   1 year(s)  The format for your next appointment:   In Person  Provider:   Brian Crenshaw, MD   

## 2021-02-04 ENCOUNTER — Other Ambulatory Visit: Payer: Self-pay

## 2021-02-07 ENCOUNTER — Other Ambulatory Visit: Payer: Self-pay | Admitting: Internal Medicine

## 2021-02-12 DIAGNOSIS — M79642 Pain in left hand: Secondary | ICD-10-CM | POA: Diagnosis not present

## 2021-02-12 DIAGNOSIS — Z4789 Encounter for other orthopedic aftercare: Secondary | ICD-10-CM | POA: Diagnosis not present

## 2021-02-12 DIAGNOSIS — S62307A Unspecified fracture of fifth metacarpal bone, left hand, initial encounter for closed fracture: Secondary | ICD-10-CM | POA: Diagnosis not present

## 2021-02-26 DIAGNOSIS — M79642 Pain in left hand: Secondary | ICD-10-CM | POA: Diagnosis not present

## 2021-03-05 DIAGNOSIS — M79642 Pain in left hand: Secondary | ICD-10-CM | POA: Diagnosis not present

## 2021-03-06 DIAGNOSIS — Z1231 Encounter for screening mammogram for malignant neoplasm of breast: Secondary | ICD-10-CM | POA: Diagnosis not present

## 2021-03-12 DIAGNOSIS — S62307D Unspecified fracture of fifth metacarpal bone, left hand, subsequent encounter for fracture with routine healing: Secondary | ICD-10-CM | POA: Diagnosis not present

## 2021-03-12 DIAGNOSIS — Z4789 Encounter for other orthopedic aftercare: Secondary | ICD-10-CM | POA: Diagnosis not present

## 2021-03-12 DIAGNOSIS — M1812 Unilateral primary osteoarthritis of first carpometacarpal joint, left hand: Secondary | ICD-10-CM | POA: Diagnosis not present

## 2021-03-12 DIAGNOSIS — M65332 Trigger finger, left middle finger: Secondary | ICD-10-CM | POA: Diagnosis not present

## 2021-03-12 DIAGNOSIS — M79642 Pain in left hand: Secondary | ICD-10-CM | POA: Diagnosis not present

## 2021-03-12 DIAGNOSIS — M189 Osteoarthritis of first carpometacarpal joint, unspecified: Secondary | ICD-10-CM | POA: Diagnosis not present

## 2021-03-16 ENCOUNTER — Other Ambulatory Visit (HOSPITAL_COMMUNITY): Payer: Self-pay

## 2021-03-18 DIAGNOSIS — M79642 Pain in left hand: Secondary | ICD-10-CM | POA: Diagnosis not present

## 2021-03-19 ENCOUNTER — Encounter: Payer: Self-pay | Admitting: Family Medicine

## 2021-03-19 DIAGNOSIS — R922 Inconclusive mammogram: Secondary | ICD-10-CM | POA: Diagnosis not present

## 2021-03-19 DIAGNOSIS — R928 Other abnormal and inconclusive findings on diagnostic imaging of breast: Secondary | ICD-10-CM | POA: Diagnosis not present

## 2021-03-19 DIAGNOSIS — R921 Mammographic calcification found on diagnostic imaging of breast: Secondary | ICD-10-CM | POA: Diagnosis not present

## 2021-03-19 LAB — HM MAMMOGRAPHY

## 2021-03-25 DIAGNOSIS — M79642 Pain in left hand: Secondary | ICD-10-CM | POA: Diagnosis not present

## 2021-03-27 ENCOUNTER — Telehealth: Payer: Self-pay | Admitting: Pharmacy Technician

## 2021-03-27 ENCOUNTER — Other Ambulatory Visit (HOSPITAL_COMMUNITY): Payer: Self-pay

## 2021-03-27 NOTE — Telephone Encounter (Signed)
Patient Advocate Encounter ? ?Received notification from [place] ([employer]) that prior authorization for [facility] is required. ?  ?PA NOT NEEDED ?Key [id number]  ? ?This medication or product is on your plan's list of covered drugs. Prior authorization is not required at this time.  ? ?Daphne Karrer R Aleksandr Pellow, CPhT ?Patient Advocate ?Phone: [phone] ?Fax:  [phone] ? ?

## 2021-04-03 ENCOUNTER — Encounter: Payer: Self-pay | Admitting: Family Medicine

## 2021-04-03 ENCOUNTER — Other Ambulatory Visit: Payer: Self-pay | Admitting: Radiology

## 2021-04-03 DIAGNOSIS — R921 Mammographic calcification found on diagnostic imaging of breast: Secondary | ICD-10-CM | POA: Diagnosis not present

## 2021-04-03 DIAGNOSIS — N6012 Diffuse cystic mastopathy of left breast: Secondary | ICD-10-CM | POA: Diagnosis not present

## 2021-04-03 DIAGNOSIS — N6011 Diffuse cystic mastopathy of right breast: Secondary | ICD-10-CM | POA: Diagnosis not present

## 2021-04-04 ENCOUNTER — Encounter: Payer: Self-pay | Admitting: Family Medicine

## 2021-04-14 ENCOUNTER — Telehealth: Payer: Medicare Other

## 2021-05-11 DIAGNOSIS — Z85828 Personal history of other malignant neoplasm of skin: Secondary | ICD-10-CM | POA: Diagnosis not present

## 2021-05-11 DIAGNOSIS — L570 Actinic keratosis: Secondary | ICD-10-CM | POA: Diagnosis not present

## 2021-05-11 DIAGNOSIS — C44519 Basal cell carcinoma of skin of other part of trunk: Secondary | ICD-10-CM | POA: Diagnosis not present

## 2021-05-11 DIAGNOSIS — L304 Erythema intertrigo: Secondary | ICD-10-CM | POA: Diagnosis not present

## 2021-05-11 DIAGNOSIS — L718 Other rosacea: Secondary | ICD-10-CM | POA: Diagnosis not present

## 2021-05-11 DIAGNOSIS — C44622 Squamous cell carcinoma of skin of right upper limb, including shoulder: Secondary | ICD-10-CM | POA: Diagnosis not present

## 2021-05-11 DIAGNOSIS — L720 Epidermal cyst: Secondary | ICD-10-CM | POA: Diagnosis not present

## 2021-05-11 DIAGNOSIS — L400 Psoriasis vulgaris: Secondary | ICD-10-CM | POA: Diagnosis not present

## 2021-05-11 DIAGNOSIS — L821 Other seborrheic keratosis: Secondary | ICD-10-CM | POA: Diagnosis not present

## 2021-06-01 ENCOUNTER — Ambulatory Visit (INDEPENDENT_AMBULATORY_CARE_PROVIDER_SITE_OTHER): Payer: Medicare Other | Admitting: Family Medicine

## 2021-06-01 ENCOUNTER — Encounter: Payer: Self-pay | Admitting: Family Medicine

## 2021-06-01 VITALS — BP 110/62 | HR 62 | Temp 97.9°F | Ht 61.0 in | Wt 111.4 lb

## 2021-06-01 DIAGNOSIS — Z79899 Other long term (current) drug therapy: Secondary | ICD-10-CM

## 2021-06-01 DIAGNOSIS — I700 Atherosclerosis of aorta: Secondary | ICD-10-CM

## 2021-06-01 DIAGNOSIS — E785 Hyperlipidemia, unspecified: Secondary | ICD-10-CM | POA: Diagnosis not present

## 2021-06-01 DIAGNOSIS — J479 Bronchiectasis, uncomplicated: Secondary | ICD-10-CM | POA: Diagnosis not present

## 2021-06-01 DIAGNOSIS — I2510 Atherosclerotic heart disease of native coronary artery without angina pectoris: Secondary | ICD-10-CM | POA: Diagnosis not present

## 2021-06-01 DIAGNOSIS — D692 Other nonthrombocytopenic purpura: Secondary | ICD-10-CM | POA: Diagnosis not present

## 2021-06-01 DIAGNOSIS — I10 Essential (primary) hypertension: Secondary | ICD-10-CM

## 2021-06-01 DIAGNOSIS — E039 Hypothyroidism, unspecified: Secondary | ICD-10-CM

## 2021-06-01 DIAGNOSIS — A310 Pulmonary mycobacterial infection: Secondary | ICD-10-CM | POA: Diagnosis not present

## 2021-06-01 DIAGNOSIS — E8881 Metabolic syndrome: Secondary | ICD-10-CM

## 2021-06-01 DIAGNOSIS — Z0000 Encounter for general adult medical examination without abnormal findings: Secondary | ICD-10-CM | POA: Diagnosis not present

## 2021-06-01 DIAGNOSIS — I2583 Coronary atherosclerosis due to lipid rich plaque: Secondary | ICD-10-CM | POA: Diagnosis not present

## 2021-06-01 DIAGNOSIS — R7303 Prediabetes: Secondary | ICD-10-CM

## 2021-06-01 LAB — COMPREHENSIVE METABOLIC PANEL
ALT: 20 U/L (ref 0–35)
AST: 31 U/L (ref 0–37)
Albumin: 4.2 g/dL (ref 3.5–5.2)
Alkaline Phosphatase: 73 U/L (ref 39–117)
BUN: 14 mg/dL (ref 6–23)
CO2: 28 mEq/L (ref 19–32)
Calcium: 9.4 mg/dL (ref 8.4–10.5)
Chloride: 100 mEq/L (ref 96–112)
Creatinine, Ser: 0.85 mg/dL (ref 0.40–1.20)
GFR: 65.72 mL/min (ref >60.00)
Glucose, Bld: 94 mg/dL (ref 70–99)
Potassium: 3.6 mEq/L (ref 3.5–5.1)
Sodium: 135 mEq/L (ref 135–145)
Total Bilirubin: 0.4 mg/dL (ref 0.2–1.2)
Total Protein: 7 g/dL (ref 6.0–8.3)

## 2021-06-01 LAB — LIPID PANEL
Cholesterol: 128 mg/dL (ref 0–200)
HDL: 71.7 mg/dL (ref >39.00)
LDL Cholesterol: 43 mg/dL (ref 0–99)
NonHDL: 56.59
Total CHOL/HDL Ratio: 2
Triglycerides: 67 mg/dL (ref 0.0–149.0)
VLDL: 13.4 mg/dL (ref 0.0–40.0)

## 2021-06-01 LAB — CBC WITH DIFFERENTIAL/PLATELET
Basophils Absolute: 0 10*3/uL (ref 0.0–0.1)
Basophils Relative: 0.6 % (ref 0.0–3.0)
Eosinophils Absolute: 0 10*3/uL (ref 0.0–0.7)
Eosinophils Relative: 0.7 % (ref 0.0–5.0)
HCT: 40.6 % (ref 36.0–46.0)
Hemoglobin: 13.5 g/dL (ref 12.0–15.0)
Lymphocytes Relative: 18.2 % (ref 12.0–46.0)
Lymphs Abs: 1 10*3/uL (ref 0.7–4.0)
MCHC: 33.2 g/dL (ref 30.0–36.0)
MCV: 95 fl (ref 78.0–100.0)
Monocytes Absolute: 0.4 10*3/uL (ref 0.1–1.0)
Monocytes Relative: 6.4 % (ref 3.0–12.0)
Neutro Abs: 4.1 10*3/uL (ref 1.4–7.7)
Neutrophils Relative %: 74.1 % (ref 43.0–77.0)
Platelets: 254 10*3/uL (ref 150.0–400.0)
RBC: 4.27 Mil/uL (ref 3.87–5.11)
RDW: 13 % (ref 11.5–15.5)
WBC: 5.6 10*3/uL (ref 4.0–10.5)

## 2021-06-01 LAB — VITAMIN B12: Vitamin B-12: 338 pg/mL (ref 211–911)

## 2021-06-01 LAB — HEMOGLOBIN A1C: Hgb A1c MFr Bld: 6.3 % (ref 4.6–6.5)

## 2021-06-01 LAB — TSH: TSH: 1.3 u[IU]/mL (ref 0.35–5.50)

## 2021-06-01 MED ORDER — SYNTHROID 75 MCG PO TABS
ORAL_TABLET | ORAL | 3 refills | Status: DC
Start: 2021-06-01 — End: 2022-06-30

## 2021-06-01 MED ORDER — ALPRAZOLAM 0.25 MG PO TABS: ORAL_TABLET | ORAL | 1 refills | Status: DC

## 2021-06-01 MED ORDER — METFORMIN HCL 500 MG PO TABS: ORAL_TABLET | ORAL | 3 refills | Status: DC

## 2021-06-01 MED ORDER — AMITRIPTYLINE HCL 50 MG PO TABS: 50.0000 mg | ORAL_TABLET | Freq: Every day | ORAL | 3 refills | Status: DC

## 2021-06-01 NOTE — Progress Notes (Signed)
?Phone [phone] ?  ?Subjective:  ?Patient presents today for their annual physical. Chief complaint-noted.  ? ?See problem oriented charting- ?ROS- full  review of systems was completed and negative ?except for: tinnitus, cough, wheezing, constipation, joint pain, muscle aches, bleeds easily, anxiety at times in dayitme- sleep does well with meds ? ?The following were reviewed and entered/updated in epic: ?Past Medical History:  ?Diagnosis Date  ? Bronchiectasis   ? oxygen at night in the past  ? CAD (coronary artery disease)   ? Diverticulitis 2014  ? GERD (gastroesophageal reflux disease)   ? History of shingles 04/2012  ? HTN (hypertension)   ? Hyperlipidemia   ? Hypothyroidism   ? MAI (mycobacterium avium-intracellulare) ([place])   ? Neuritis of upper extremity   ? ?Patient Active Problem List  ? Diagnosis Date Noted  ? Osteopenia 08/14/2011  ?  Priority: High  ? CAD (coronary artery disease) s/p CABG 07/03/2007  ?  Priority: High  ? Aortic atherosclerosis ([place]) 05/28/2020  ?  Priority: Medium   ? Insulin resistance 09/07/2017  ?  Priority: Medium   ? Hyperglycemia 08/05/2014  ?  Priority: Medium   ? Insomnia 02/12/2013  ?  Priority: Medium   ? Nocturnal hypoxemia 12/12/2012  ?  Priority: Medium   ? Fibromyalgia 12/28/2011  ?  Priority: Medium   ? Hypothyroidism 04/06/2010  ?  Priority: Medium   ? MAI (mycobacterium avium-intracellulare) ([place]) 11/19/2009  ?  Priority: Medium   ? Hyperlipemia 07/03/2007  ?  Priority: Medium   ? Essential hypertension 07/03/2007  ?  Priority: Medium   ? Obstructive bronchiectasis ([place]) with GOLD II/III criteria 07/03/2007  ?  Priority: Medium   ? Senile purpura ([place]) 05/15/2019  ?  Priority: Low  ? Cystitis 11/05/2016  ?  Priority: Low  ? Former smoker 08/05/2014  ?  Priority: Low  ? Constipation 05/02/2014  ?  Priority: Low  ? History of colonic polyps 05/02/2014  ?  Priority: Low  ? GERD (gastroesophageal reflux disease) 02/24/2014  ?  Priority: Low  ? Hemoptysis 02/24/2014  ?   Priority: Low  ? Benign paroxysmal positional vertigo 04/18/2013  ?  Priority: Low  ? Diverticulitis 12/21/2012  ?  Priority: Low  ? Cerebrovascular disease 01/31/2015  ? ?Past Surgical History:  ?Procedure Laterality Date  ? CORONARY ARTERY BYPASS GRAFT  2004  ? x3 CABG  ? ? ?Family History  ?Problem Relation Age of Onset  ? Colon cancer Mother   ? Hyperlipidemia Mother   ? Hypertension Mother   ? Heart disease Mother   ? Stroke Mother   ? Diabetes Mother   ? Colon cancer Father   ? Arthritis Father   ? Hyperlipidemia Father   ? Hypertension Father   ? Heart disease Father   ? Stroke Father   ? Atopy Neg Hx   ? ? ?Medications- reviewed and updated ?Current Outpatient Medications  ?Medication Sig Dispense Refill  ? acetaminophen (TYLENOL) 650 MG CR tablet every 8 (eight) hours as needed for pain.     ? albuterol (PROAIR HFA) 108 (90 Base) MCG/ACT inhaler Inhale 2 puffs into the lungs every 6 (six) hours as needed for wheezing or shortness of breath. 1 each 1  ? ALPRAZolam (XANAX) 0.25 MG tablet TAKE 1 TABLET BY MOUTH AT BEDTIME AS NEEDED FOR SLEEP 90 tablet 1  ? amitriptyline (ELAVIL) 50 MG tablet TAKE 1 TABLET BY MOUTH AT BEDTIME 90 tablet 3  ? aspirin EC 81 MG tablet  Take 81 mg by mouth daily.    ? atorvastatin (LIPITOR) 80 MG tablet TAKE 1 TABLET(80 MG) BY MOUTH DAILY 90 tablet 3  ? azithromycin (ZITHROMAX) 250 MG tablet TAKE 1 TABLET BY MOUTH DAILY 30 tablet 5  ? Dextromethorphan-Guaifenesin (MUCINEX DM) 30-600 MG TB12 Take 1 tablet by mouth daily. Patient takes '1200mg'$     ? Melatonin 5 MG TABS Take 5 mg by mouth at bedtime.    ? metFORMIN (GLUCOPHAGE) 500 MG tablet TAKE 1 TABLET(500 MG) BY MOUTH TWICE DAILY WITH A MEAL 180 tablet 3  ? mometasone-formoterol (DULERA) 200-5 MCG/ACT AERO INHALE 2 PUFFS INTO THE LUNGS EVERY MORNING AND EVERY NIGHT AT BEDTIME 13 g 11  ? Multiple Vitamin (MULTIVITAMIN) tablet Take 1 tablet by mouth daily.    ? nebivolol (BYSTOLIC) 2.5 MG tablet Take 1 tablet (2.5 mg total) by mouth  daily. 90 tablet 2  ? Respiratory Therapy Supplies (FLUTTER) DEVI Use as directed 1 each 0  ? SYNTHROID 75 MCG tablet TAKE 1 TABLET(75 MCG) BY MOUTH DAILY BEFORE AND BREAKFAST 90 tablet 3  ? telmisartan (MICARDIS) 80 MG tablet TAKE 1 TABLET(80 MG) BY MOUTH DAILY 90 tablet 3  ? VITAMIN D, CHOLECALCIFEROL, PO Take by mouth.    ? ?No current facility-administered medications for this visit.  ? ? ?Allergies-reviewed and updated ?Allergies  ?Allergen Reactions  ? Bactrim [Sulfamethoxazole-Trimethoprim] Hives, Itching and Other (See Comments)  ?  Bruised like areas on body  ? Ciprofloxacin Other (See Comments)  ?  Body aches  ? Codeine Nausea Only  ?  REACTION: nausea  ? Erythromycin Nausea Only  ?  REACTION: nausea  ? Levofloxacin Other (See Comments)  ?  REACTION: aches  ? ? ?Social History  ? ?Social History Narrative  ? Family: Single never married, no children, cat and rehabs turtles and tortoise  ? Went to [employer] in [employer] and social work, some business courses at [employer], EMT for 6 years.   ? LIves alone. Completely independent.   ? Lives in retirement community.   ?   ? 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   ? Hobbies: kayaking, gardening- mows own lawn  ? ?Objective  ?Objective:  ?BP 110/62   Pulse 62   Temp 97.9 ?F (36.6 ?C)   Ht '5\' 1"'$  (1.549 m)   Wt 111 lb 6.4 oz (50.5 kg)   SpO2 97%   BMI 21.05 kg/m?  ?Gen: NAD, resting comfortably ?HEENT: Mucous membranes are moist. Oropharynx normal ?Neck: no thyromegaly ?CV: RRR no murmurs rubs or gallops ?Lungs: diffuse wheeze but otherwise clear ?Abdomen: soft/nontender/nondistended/normal bowel sounds. No rebound or guarding.  ?Ext: no edema ?Skin: warm, dry ?Neuro: grossly normal, moves all extremities, PERRLA ?  ?Assessment and Plan  ? ?76 y.o. female presenting for annual physical.  ?Health Maintenance counseling: ?1. Anticipatory guidance: Patient counseled regarding regular dental exams -q6  months, eye exams - at least yearly,  avoiding smoking and second hand smoke, limiting alcohol to 1 beverage per day- doesn't drink, no illicit drugs.   ?2. Risk factor reduction:  Advised patient of need for regular exercise and diet rich and fruits and vegetables to reduce risk of heart attack and stroke.  ?Exercise- gardening regularly but can bother her with fibromyalgia at times- tylenol and hot shower help.  ?Diet/weight management-up 1 pound from last physical-we have discussed okay with 10 pound weight gain ?Wt Readings from Last 3 Encounters:  ?06/01/21 111 lb 6.4  oz (50.5 kg)  ?02/03/21 110 lb (49.9 kg)  ?11/28/20 109 lb 6.4 oz (49.6 kg)  ?3. Immunizations/screenings/ancillary studies-fully up-to-date other than discussed could get additional COVID vaccination at this point - she is considering RSV at pharmacy as well ?Immunization History  ?Administered Date(s) Administered  ? Fluad Quad(high Dose 65+) 10/28/2020  ? H1N1 01/02/2008  ? Influenza Split 10/14/2010, 10/07/2011  ? Influenza, High Dose Seasonal PF 11/05/2016  ? Influenza,inj,Quad PF,6+ Mos 10/02/2012, 10/16/2015, 11/07/2017, 10/05/2018, 10/30/2019  ? Influenza-Unspecified 09/25/2013, 10/15/2014, 11/07/2017  ? Moderna Covid-19 Vaccine Bivalent Booster 61yr & up 11/14/2020  ? Moderna Sars-Covid-2 Vaccination 02/09/2019, 03/12/2019, 09/05/2019, 05/12/2020  ? PNEUMOCOCCAL CONJUGATE-20 08/27/2020  ? Pneumococcal Conjugate-13 08/14/2013  ? Pneumococcal Polysaccharide-23 10/29/2011  ? Td 05/15/2019  ? Zoster Recombinat (Shingrix) 06/25/2017, 09/30/2017  ? 4. Cervical cancer screening- past age based screening recommendations -denies ever having abnormal Pap. Saw GYN 01/30/21 - was told no more paps- Dr. WDema Severin?5. Breast cancer screening-  breast exam with GYN and mammogram  with GYN-typically does with [facility]- did have 2 biopsies which were reassuring ?6. Colon cancer screening -08/11/2018 with WZigmund Danielbeen seeing even more recently and there was  consideration of additional colonoscopy- states has one this summer  ?7. Skin cancer screening-follows with dermatology yearly- 2 month follow up due to skin cancers recently removed. advised regular sunscreen

## 2021-06-01 NOTE — Patient Instructions (Addendum)
Please stop by lab before you go ?If you have mychart- we will send your results within 3 business days of [place] receiving them.  ?If you do not have mychart- we will call you about results within 5 business days of [place] receiving them.  ?*please also note that you will see labs on mychart as soon as they post. I will later go in and write notes on them- will say "notes from Dr. Yong Channel"  ? ?Glad you are doing well! ? ?Recommended follow up: Return in about 6 months (around 12/02/2021) for followup or sooner if needed.Schedule b4 you leave. ?

## 2021-06-15 NOTE — Progress Notes (Signed)
This encounter was created in error - please disregard.left message for patient to reschedule

## 2021-06-17 DIAGNOSIS — K5904 Chronic idiopathic constipation: Secondary | ICD-10-CM | POA: Diagnosis not present

## 2021-06-17 DIAGNOSIS — Z800 Family history of malignant neoplasm of digestive organs: Secondary | ICD-10-CM | POA: Diagnosis not present

## 2021-06-17 DIAGNOSIS — Z86010 Personal history of colonic polyps: Secondary | ICD-10-CM | POA: Diagnosis not present

## 2021-06-25 NOTE — Progress Notes (Signed)
Chronic Care Management Pharmacy Note  07/01/2021 Name:  Belinda Day MRN:  [id number] DOB:  November 16, 1945  Summary: PharmD follow up.  Patient doing well, continues to take metformin.  Discussed dietary mods to prevent further increase from A1c.  Recommendations/Changes made from today's visit: None, continue routine A1c monitoring  Plan: FU 6 months   Subjective: Belinda Day is an 76 y.o. year old female who is a primary patient of Hunter, Brayton Mars, MD.  The CCM team was consulted for assistance with disease management and care coordination needs.    Engaged with patient by telephone for follow up visit in response to provider referral for pharmacy case management and/or care coordination services.   Consent to Services:  The patient was given the following information about Chronic Care Management services today, agreed to services, and gave verbal consent: 1. CCM service includes personalized support from designated clinical staff supervised by the primary care provider, including individualized plan of care and coordination with other care providers 2. 24/7 contact phone numbers for assistance for urgent and routine care needs. 3. Service will only be billed when office clinical staff spend 20 minutes or more in a month to coordinate care. 4. Only one practitioner may furnish and bill the service in a calendar month. 5.The patient may stop CCM services at any time (effective at the end of the month) by phone call to the office staff. 6. The patient will be responsible for cost sharing (co-pay) of up to 20% of the service fee (after annual deductible is met). Patient agreed to services and consent obtained.  Patient Care Team: Marin Olp, MD as PCP - General (Family Medicine) Stanford Breed Denice Bors, MD as PCP - Cardiology (Cardiology) Opthamology, [facility] as Consulting Physician (Ophthalmology) Tanda Rockers, MD as Consulting Physician (Pulmonary Disease) Roseanne Kaufman,  MD as Consulting Physician (Orthopedic Surgery) Martin[place], Amy, MD as Consulting Physician (Dermatology) Edythe Clarity, [facility] (Pharmacist)  Recent office visits:  05/28/21 Yong Channel) - no medication changes, A1c increased slightly patient to watch her dietary intake and activity level.  05/28/20 Yong Channel) - general exam, no changes to medication at this time  Recent consult visits: 08/25/20 Melvyn Novas, Pulm) - no med changes, patient is cleared for surgery on hand  Hospital visits: None in previous 6 months   Objective:  Lab Results  Component Value Date   CREATININE 0.85 06/01/2021   BUN 14 06/01/2021   GFR 65.72 06/01/2021   GFRNONAA 56 (L) 11/27/2019   GFRAA 65 11/27/2019   NA 135 06/01/2021   K 3.6 06/01/2021   CALCIUM 9.4 06/01/2021   CO2 28 06/01/2021   GLUCOSE 94 06/01/2021    Lab Results  Component Value Date/Time   HGBA1C 6.3 06/01/2021 10:00 AM   HGBA1C 6.2 11/28/2020 11:21 AM   GFR 65.72 06/01/2021 10:00 AM   GFR 68.87 11/28/2020 11:21 AM    Last diabetic Eye exam:  Lab Results  Component Value Date/Time   HMDIABEYEEXA No Retinopathy 10/11/2019 12:00 AM    Last diabetic Foot exam: No results found for: HMDIABFOOTEX   Lab Results  Component Value Date   CHOL 128 06/01/2021   HDL 71.70 06/01/2021   LDLCALC 43 06/01/2021   LDLDIRECT 51.0 11/28/2020   TRIG 67.0 06/01/2021   CHOLHDL 2 06/01/2021       Latest Ref Rng & Units 06/01/2021   10:00 AM 11/28/2020   11:21 AM 05/28/2020    1:52 PM  Hepatic Function  Total Protein 6.0 -  8.3 g/dL 7.0   6.9   6.6    Albumin 3.5 - 5.2 g/dL 4.2   4.2   4.4    AST 0 - 37 U/L _0 ALT 0 - 35 U/L _1 Alk Phosphatase 39 - 117 U/L 73   88   88    Total Bilirubin 0.2 - 1.2 mg/dL 0.4   0.4   0.4      Lab Results  Component Value Date/Time   TSH 1.30 06/01/2021 10:00 AM   TSH 0.90 11/28/2020 11:21 AM   FREET4 1.14 02/01/2012 10:03 AM   FREET4 0.94 08/02/2011 09:07 AM       Latest Ref Rng &  Units 06/01/2021   10:00 AM 11/28/2020   11:21 AM 05/28/2020    1:52 PM  CBC  WBC 4.0 - 10.5 K/uL 5.6   6.8   7.8    Hemoglobin 12.0 - 15.0 g/dL 13.5   13.3   13.5    Hematocrit 36.0 - 46.0 % 40.6   39.9   40.8    Platelets 150.0 - 400.0 K/uL 254.0   276.0   308.0      Lab Results  Component Value Date/Time   VD25OH 50 02/01/2012 10:03 AM    Clinical ASCVD: No  The ASCVD Risk score (Arnett DK, et al., 2019) failed to calculate for the following reasons:   The valid total cholesterol range is 130 to 320 mg/dL       11/28/2020   10:48 AM 06/09/2020   11:56 AM 05/28/2020   12:54 PM  Depression screen PHQ 2/9  Decreased Interest 0 0 0  Down, Depressed, Hopeless 0 0 0  PHQ - 2 Score 0 0 0  Altered sleeping   0  Tired, decreased energy   0  Change in appetite   0  Feeling bad or failure about yourself    0  Trouble concentrating   0  Moving slowly or fidgety/restless   0  Suicidal thoughts   0  PHQ-9 Score   0  Difficult doing work/chores   Not difficult at all     Social History   Tobacco Use  Smoking Status Former   Packs/day: 1.00   Years: 20.00   Pack years: 20.00   Types: Cigarettes   Quit date: 01/25/1970   Years since quitting: 51.4  Smokeless Tobacco Never   BP Readings from Last 3 Encounters:  06/01/21 110/62  02/03/21 122/66  11/28/20 124/68   Pulse Readings from Last 3 Encounters:  06/01/21 62  02/03/21 77  11/28/20 74   Wt Readings from Last 3 Encounters:  06/01/21 111 lb 6.4 oz (50.5 kg)  02/03/21 110 lb (49.9 kg)  11/28/20 109 lb 6.4 oz (49.6 kg)   BMI Readings from Last 3 Encounters:  06/01/21 21.05 kg/m  02/03/21 20.78 kg/m  11/28/20 20.68 kg/m    Assessment/Interventions: Review of patient past medical history, allergies, medications, health status, including review of consultants reports, laboratory and other test data, was performed as part of comprehensive evaluation and provision of chronic care management services.   SDOH:  (Social  Determinants of Health) assessments and interventions performed: Yes  Financial Resource Strain: Not on file    SDOH Screenings   Alcohol Screen: Not on file  Depression (PHQ2-9): Low Risk    PHQ-2 Score: 0  Financial Resource Strain: Not on file  Food Insecurity: Not on file  Housing: Not on file  Physical Activity: Not on file  Social Connections: Not on file  Stress: Not on file  Tobacco Use: Medium Risk   Smoking Tobacco Use: Former   Smokeless Tobacco Use: Never   Passive Exposure: Not on file  Transportation Needs: Not on file    CCM Care Plan  Allergies  Allergen Reactions   Bactrim [Sulfamethoxazole-Trimethoprim] Hives, Itching and Other (See Comments)    Bruised like areas on body   Ciprofloxacin Other (See Comments)    Body aches   Codeine Nausea Only    REACTION: nausea   Erythromycin Nausea Only    REACTION: nausea   Levofloxacin Other (See Comments)    REACTION: aches    Medications Reviewed Today     Reviewed by Edythe Clarity, [facility] (Pharmacist) on 07/01/21 at 1442  Med List Status: <None>   Medication Order Taking? Sig Documenting Provider Last Dose Status Informant  acetaminophen (TYLENOL) 650 MG CR tablet [id number] Yes every 8 (eight) hours as needed for pain.  [provider] Taking Active Self  albuterol (PROAIR HFA) 108 (90 Base) MCG/ACT inhaler [id number] Yes Inhale 2 puffs into the lungs every 6 (six) hours as needed for wheezing or shortness of breath. Tanda Rockers, MD Taking Active   ALPRAZolam Duanne Moron) 0.25 MG tablet [id number] Yes TAKE 1 TABLET BY MOUTH AT BEDTIME AS NEEDED FOR SLEEP. May take very sparingly for daytime anxiety at least 8 hours from nighttime dose. Do not drive for 8 hours after taking Marin Olp, MD Taking Active   amitriptyline (ELAVIL) 50 MG tablet [id number] Yes Take 1 tablet (50 mg total) by mouth at bedtime. Marin Olp, MD Taking Active   aspirin EC 81 MG tablet [id number] Yes Take 81 mg by mouth  daily. [provider] Taking Active   atorvastatin (LIPITOR) 80 MG tablet [id number] Yes TAKE 1 TABLET(80 MG) BY MOUTH DAILY Stanford Breed Denice Bors, MD Taking Active   azithromycin (ZITHROMAX) 250 MG tablet [id number] Yes TAKE 1 TABLET BY MOUTH DAILY Tanda Rockers, MD Taking Active   Dextromethorphan-Guaifenesin [facility] DM) 30-600 MG TB12 [id number] Yes Take 1 tablet by mouth daily. Patient takes 1249m [provider] Taking Active   Melatonin 5 MG TABS [id number] Take 5 mg by mouth at bedtime. [provider] Taking Active   metFORMIN (GLUCOPHAGE) 500 MG tablet [id number] TAKE 1 TABLET(500 MG) BY MOUTH TWICE DAILY WITH A MEAL HMarin Olp MD Taking Active   mometasone-formoterol ([facility] 200-5 MCG/ACT AERO [id number] INHALE 2 PUFFS INTO THE LUNGS EVERY MORNING AND EVERY NIGHT AT BEDTIME WTanda Rockers MD Taking Active   Multiple Vitamin (MULTIVITAMIN) tablet [id number] Take 1 tablet by mouth daily. [provider] Taking Active   nebivolol (BYSTOLIC) 2.5 MG tablet [id number] Take 1 tablet (2.5 mg total) by mouth daily. LWarren Lacy PA-C Taking Active   Respiratory Therapy Supplies (FLUTTER) DEVI [id number] Use as directed WTanda Rockers MD Taking Active Self  SYNTHROID 75 MCG tablet [id number] TAKE 1 TABLET(75 MCG) BY MOUTH DAILY BEFORE AND BREAKFAST HMarin Olp MD Taking Active   telmisartan (MICARDIS) 80 MG tablet [id number] TAKE 1 TABLET(80 MG) BY MOUTH DAILY CStanford BreedBDenice Bors MD Taking Active   VITAMIN D, CHOLECALCIFEROL, PO [id number] Take by mouth. [provider] Taking Active             Patient Active Problem List  Diagnosis Date Noted   Aortic atherosclerosis ([place]) 05/28/2020   Senile purpura ([place]) 05/15/2019   Insulin resistance 09/07/2017   Cystitis 11/05/2016   Cerebrovascular disease 01/31/2015   Former smoker 08/05/2014   Hyperglycemia 08/05/2014   Constipation 05/02/2014    History of colonic polyps 05/02/2014   GERD (gastroesophageal reflux disease) 02/24/2014   Hemoptysis 02/24/2014   Benign paroxysmal positional vertigo 04/18/2013   Insomnia 02/12/2013   Diverticulitis 12/21/2012   Nocturnal hypoxemia 12/12/2012   Fibromyalgia 12/28/2011   Osteopenia 08/14/2011   Hypothyroidism 04/06/2010   MAI (mycobacterium avium-intracellulare) ([place]) 11/19/2009   Hyperlipemia 07/03/2007   Essential hypertension 07/03/2007   CAD (coronary artery disease) s/p CABG 07/03/2007   Obstructive bronchiectasis ([place]) with GOLD II/III criteria 07/03/2007    Immunization History  Administered Date(s) Administered   Fluad Quad(high Dose 65+) 10/28/2020   H1N1 01/02/2008   Influenza Split 10/14/2010, 10/07/2011   Influenza, High Dose Seasonal PF 11/05/2016, 10/28/2020   Influenza,inj,Quad PF,6+ Mos 10/02/2012, 10/16/2015, 11/07/2017, 10/05/2018, 10/30/2019   Influenza-Unspecified 09/25/2013, 10/15/2014, 11/07/2017   Moderna Covid-19 Vaccine Bivalent Booster 11yr & up 11/14/2020   Moderna Sars-Covid-2 Vaccination 02/09/2019, 03/12/2019, 09/05/2019, 05/12/2020   PNEUMOCOCCAL CONJUGATE-20 08/27/2020   Pneumococcal Conjugate-13 08/14/2013   Pneumococcal Polysaccharide-23 10/29/2011   Td 05/15/2019   Zoster Recombinat (Shingrix) 06/25/2017, 09/30/2017    Conditions to be addressed/monitored:  Hypertension and Hyperlipidemia  Care Plan : General Pharmacy (Adult)  Updates made by DEdythe Clarity RPH since 07/01/2021 12:00 AM     Problem: HTN, HLD   Priority: High  Onset Date: 09/30/2020     Long-Range Goal: Patient-Specific Goal   Start Date: 09/30/2020  Expected End Date: 03/30/2021  Recent Progress: On track  Priority: High  Note:   Current Barriers:  No home BP monitoring Med adherence DM - metformin  Pharmacist Clinical Goal(s):  Patient will achieve improvement in adherence as evidenced by [facility] collaboration with PharmD and provider.    Interventions: 1:1 collaboration with HMarin Olp MD regarding development and update of comprehensive plan of care as evidenced by provider attestation and co-signature Inter-disciplinary care team collaboration (see longitudinal plan of care) Comprehensive medication review performed; medication list updated in electronic medical record  Hypertension (BP goal <140/90) -Controlled -Current treatment: Telmisartan 879mdaily Appropriate, Effective, Safe, Accessible Nebivolol 2.59m66mppropriate, Effective, Safe, Accessible -Medications previously tried: Valsartan, irbesartan, losartan  -Current home readings: not checking at home  -Denies hypotensive/hypertensive symptoms -Educated on BP goals and benefits of medications for prevention of heart attack, stroke and kidney damage; Exercise goal of 150 minutes per week; Importance of home blood pressure monitoring; Symptoms of hypotension and importance of maintaining adequate hydration; -Counseled to monitor BP at home periodically, document, and provide log at future appointments -Recommended to continue current medication  Update 06/30/21 110-117/68-72 BP has been normal at home, tolerating medications well. Overall feels well. BP also controlled at recent OV's. No changes needed to BP meds, continue to monitor at home.   Hyperlipidemia: (LDL goal < 100) -Controlled -Current treatment: Atorvastatin 68m52mily -Medications previously tried: none noted  -Most recent LDL is excellent -Educated on Cholesterol goals;  Benefits of statin for ASCVD risk reduction; Importance of limiting foods high in cholesterol; -Recommended to continue current medication  Pre-Diabetes (A1c goal <6.5%) -Controlled -Current medications: Metformin 500mg31mce daily Appropriate, Effective, Safe, Accessible -Medications previously tried: none noted  -Current home glucose readings fasting glucose: not checking post prandial glucose: not  checking -Denies hypoglycemic/hyperglycemic symptoms -Educated on A1c  increased slightly at last labs, she knows she is going to watch her dietary intake.  Continue adherence with metformin. -Counseled to check feet daily and get yearly eye exams -Recommended to continue current medication No changes needed at this time, continue routine A1c screenings for monitoring purposes since she is not checking at home.   Patient Goals/Self-Care Activities Patient will:  - continue to work on dietary modifications to limit carbs and excess sugars/sweets.  Follow Up Plan: The care management team will reach out to the patient again over the next 180 days.             Medication Assistance: None required.  Patient affirms current coverage meets needs.  Compliance/Adherence/Medication fill history: Care Gaps: MAD - patient notified that Metformin rx is at pharmacy ready to p/u  Star-[facility] Drugs: Metformin 572m 07/05/20 90ds Atorvastatin 297m07/27/22 39ds  [facility]: [phone]: [phone]: [phone]: [phone] Uses pill box? No - Ziploc baggies for AM and PM Pt endorses 100% compliance  We discussed: Benefits of medication synchronization, packaging and delivery as well as enhanced pharmacist oversight with [employer]. Patient decided to: Continue current medication management strategy  Care Plan and Follow Up Patient Decision:  Patient agrees to Care Plan and Follow-up.  Plan: The care management team will reach out to the patient again over the next 180 days.  ChBeverly MilchPharmD Clinical Pharmacist ([phone]

## 2021-06-30 ENCOUNTER — Ambulatory Visit: Payer: Medicare Other | Admitting: Pharmacist

## 2021-06-30 DIAGNOSIS — R739 Hyperglycemia, unspecified: Secondary | ICD-10-CM

## 2021-06-30 DIAGNOSIS — E785 Hyperlipidemia, unspecified: Secondary | ICD-10-CM

## 2021-06-30 DIAGNOSIS — I10 Essential (primary) hypertension: Secondary | ICD-10-CM

## 2021-07-01 NOTE — Patient Instructions (Addendum)
Visit Information   Goals Addressed             This Visit's Progress    Track and Manage My Blood Pressure-Hypertension   On track    Timeframe:  Long-Range Goal Priority:  High Start Date:    09/30/20                         Expected End Date:  03/30/21                     Follow Up Date 12/30/20    - check blood pressure weekly - choose a place to take my blood pressure (home, clinic or office, retail store) - write blood pressure results in a log or diary    Why is this important?   You won't feel high blood pressure, but it can still hurt your blood vessels.  High blood pressure can cause heart or kidney problems. It can also cause a stroke.  Making lifestyle changes like losing a little weight or eating less salt will help.  Checking your blood pressure at home and at different times of the day can help to control blood pressure.  If the doctor prescribes medicine remember to take it the way the doctor ordered.  Call the office if you cannot afford the medicine or if there are questions about it.     Notes:        Patient Care Plan: General Pharmacy (Adult)     Problem Identified: HTN, HLD   Priority: High  Onset Date: 09/30/2020     Long-Range Goal: Patient-Specific Goal   Start Date: 09/30/2020  Expected End Date: 03/30/2021  Recent Progress: On track  Priority: High  Note:   Current Barriers:  No home BP monitoring Med adherence DM - metformin  Pharmacist Clinical Goal(s):  Patient will achieve improvement in adherence as evidenced by [facility] through collaboration with PharmD and provider.   Interventions: 1:1 collaboration with Marin Olp, MD regarding development and update of comprehensive plan of care as evidenced by provider attestation and co-signature Inter-disciplinary care team collaboration (see longitudinal plan of care) Comprehensive medication review performed; medication list updated in electronic medical record  Hypertension (BP goal  <140/90) -Controlled -Current treatment: Telmisartan '80mg'$  daily Appropriate, Effective, Safe, Accessible Nebivolol 2.'5mg'$  Appropriate, Effective, Safe, Accessible -Medications previously tried: Valsartan, irbesartan, losartan  -Current home readings: not checking at home  -Denies hypotensive/hypertensive symptoms -Educated on BP goals and benefits of medications for prevention of heart attack, stroke and kidney damage; Exercise goal of 150 minutes per week; Importance of home blood pressure monitoring; Symptoms of hypotension and importance of maintaining adequate hydration; -Counseled to monitor BP at home periodically, document, and provide log at future appointments -Recommended to continue current medication  Update 06/30/21 110-117/68-72 BP has been normal at home, tolerating medications well. Overall feels well. BP also controlled at recent OV's. No changes needed to BP meds, continue to monitor at home.   Hyperlipidemia: (LDL goal < 100) -Controlled -Current treatment: Atorvastatin '20mg'$  daily -Medications previously tried: none noted  -Most recent LDL is excellent -Educated on Cholesterol goals;  Benefits of statin for ASCVD risk reduction; Importance of limiting foods high in cholesterol; -Recommended to continue current medication  Pre-Diabetes (A1c goal <6.5%) -Controlled -Current medications: Metformin '500mg'$  twice daily Appropriate, Effective, Safe, Accessible -Medications previously tried: none noted  -Current home glucose readings fasting glucose: not checking post prandial glucose: not checking -Denies  hypoglycemic/hyperglycemic symptoms -Educated on A1c increased slightly at last labs, she knows she is going to watch her dietary intake.  Continue adherence with metformin. -Counseled to check feet daily and get yearly eye exams -Recommended to continue current medication No changes needed at this time, continue routine A1c screenings for monitoring purposes  since she is not checking at home.   Patient Goals/Self-Care Activities Patient will:  - continue to work on dietary modifications to limit carbs and excess sugars/sweets.  Follow Up Plan: The care management team will reach out to the patient again over the next 180 days.            The patient verbalized understanding of instructions, educational materials, and care plan provided today and DECLINED offer to receive copy of patient instructions, educational materials, and care plan.  Telephone follow up appointment with pharmacy team member scheduled for: 6 months  Edythe Clarity, [place], PharmD Clinical Pharmacist  [facility] [phone]

## 2021-07-06 ENCOUNTER — Ambulatory Visit (INDEPENDENT_AMBULATORY_CARE_PROVIDER_SITE_OTHER): Payer: Medicare Other

## 2021-07-06 DIAGNOSIS — Z0000 Encounter for general adult medical examination without abnormal findings: Secondary | ICD-10-CM | POA: Diagnosis not present

## 2021-07-06 NOTE — Progress Notes (Signed)
6/Virtual Visit via Telephone Note  I connected with  [name] on 07/06/21 at  2:15 PM EDT by telephone and verified that I am speaking with the correct person using two identifiers.  Medicare Annual Wellness visit completed telephonically due to Covid-19 pandemic.   Persons participating in this call: This Health Coach and this patient.   Location: Patient: Home Provider: Office    I discussed the limitations, risks, security and privacy concerns of performing an evaluation and management service by telephone and the availability of in person appointments. The patient expressed understanding and agreed to proceed.  Unable to perform video visit due to video visit attempted and failed and/or patient does not have video capability.   Some vital signs may be absent or patient reported.   Willette Brace, LPN   Subjective:   ETHAN KASPERSKI is a 76 y.o. female who presents for Medicare Annual (Subsequent) preventive examination.  Review of Systems     Cardiac Risk Factors include: advanced age (>1mn, >>32women);dyslipidemia;hypertension     Objective:    There were no vitals filed for this visit. There is no height or weight on file to calculate BMI.     07/06/2021    2:22 PM 06/09/2020   11:57 AM 08/09/2016    3:24 PM 04/17/2015    8:29 PM 12/21/2012    8:14 PM  Advanced Directives  Does Patient Have a Medical Advance Directive? Yes Yes Yes Yes Patient has advance directive, copy not in chart  Type of Advance Directive Living will;Healthcare Power of [place] will [place] will [place] will  Does patient want to make changes to medical advance directive?   No - Patient declined No - Patient declined No  Copy of Healthcare Power of Attorney in Chart? No - copy requested No - copy requested No - copy requested No - copy requested Copy requested from  family  Pre-existing out of facility DNR order (yellow form or pink MOST form)     No    Current Medications (verified) Outpatient Encounter Medications as of 07/06/2021  Medication Sig   acetaminophen (TYLENOL) 650 MG CR tablet every 8 (eight) hours as needed for pain.    albuterol (PROAIR HFA) 108 (90 Base) MCG/ACT inhaler Inhale 2 puffs into the lungs every 6 (six) hours as needed for wheezing or shortness of breath.   ALPRAZolam (XANAX) 0.25 MG tablet TAKE 1 TABLET BY MOUTH AT BEDTIME AS NEEDED FOR SLEEP. May take very sparingly for daytime anxiety at least 8 hours from nighttime dose. Do not drive for 8 hours after taking   amitriptyline (ELAVIL) 50 MG tablet Take 1 tablet (50 mg total) by mouth at bedtime.   aspirin EC 81 MG tablet Take 81 mg by mouth daily.   atorvastatin (LIPITOR) 80 MG tablet TAKE 1 TABLET(80 MG) BY MOUTH DAILY   azithromycin (ZITHROMAX) 250 MG tablet TAKE 1 TABLET BY MOUTH DAILY   Dextromethorphan-Guaifenesin (MUCINEX DM) 30-600 MG TB12 Take 1 tablet by mouth daily. Patient takes '1200mg'$    Melatonin 5 MG TABS Take 5 mg by mouth at bedtime.   metFORMIN (GLUCOPHAGE) 500 MG tablet TAKE 1 TABLET(500 MG) BY MOUTH TWICE DAILY WITH A MEAL   mometasone-formoterol (DULERA) 200-5 MCG/ACT AERO INHALE 2 PUFFS INTO THE LUNGS EVERY MORNING AND EVERY NIGHT AT BEDTIME   Multiple Vitamin (MULTIVITAMIN) tablet Take 1 tablet by mouth daily.   nebivolol (BYSTOLIC)  2.5 MG tablet Take 1 tablet (2.5 mg total) by mouth daily.   Respiratory Therapy Supplies (FLUTTER) DEVI Use as directed   SYNTHROID 75 MCG tablet TAKE 1 TABLET(75 MCG) BY MOUTH DAILY BEFORE AND BREAKFAST   telmisartan (MICARDIS) 80 MG tablet TAKE 1 TABLET(80 MG) BY MOUTH DAILY   VITAMIN D, CHOLECALCIFEROL, PO Take by mouth.   No facility-administered encounter medications on file as of 07/06/2021.    Allergies (verified) Bactrim [sulfamethoxazole-trimethoprim], Ciprofloxacin, Codeine, Erythromycin, and Levofloxacin    History: Past Medical History:  Diagnosis Date   Bronchiectasis    oxygen at night in the past   CAD (coronary artery disease)    Diverticulitis 2014   GERD (gastroesophageal reflux disease)    History of shingles 04/2012   HTN (hypertension)    Hyperlipidemia    Hypothyroidism    MAI (mycobacterium avium-intracellulare) (HCC)    Neuritis of upper extremity    Past Surgical History:  Procedure Laterality Date   CORONARY ARTERY BYPASS GRAFT  01/25/2002   x3 CABG   HAND SURGERY     Dr. Amedeo Plenty sept 2022- improved hand movement   Family History  Problem Relation Age of Onset   Colon cancer Mother    Hyperlipidemia Mother    Hypertension Mother    Heart disease Mother    Stroke Mother    Diabetes Mother    Colon cancer Father    Arthritis Father    Hyperlipidemia Father    Hypertension Father    Heart disease Father    Stroke Father    Atopy Neg Hx    Social History   Socioeconomic History   Marital status: Single    Spouse name: Not on file   Number of children: Not on file   Years of education: Not on file   Highest education level: Not on file  Occupational History   Occupation: Retired   Tobacco Use   Smoking status: Former    Packs/day: 1.00    Years: 20.00    Total pack years: 20.00    Types: Cigarettes    Quit date: 01/25/1970    Years since quitting: 51.4   Smokeless tobacco: Never  Substance and Sexual Activity   Alcohol use: No   Drug use: No   Sexual activity: Not on file  Other Topics Concern   Not on file  Social History Narrative   Family: Single never married, no children, cat and rehabs turtles and tortoise   Went to [employer] in [employer] and social work, some business courses at [employer], EMT for 6 years.    LIves alone. Completely independent.    Lives in retirement community.       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      Hobbies: kayaking, gardening- mows own lawn    Social Determinants of Health   Financial Resource Strain: Low Risk  (07/06/2021)   Overall Financial Resource Strain (CARDIA)    Difficulty of Paying Living Expenses: Not hard at all  Food Insecurity: No Food Insecurity (07/06/2021)   Hunger Vital Sign    Worried About Running Out of Food in the Last Year: Never true    [place] in the Last Year: Never true  Transportation Needs: No Transportation Needs (07/06/2021)   PRAPARE - Hydrologist (Medical): No    Lack of Transportation (Non-Medical): No  Physical Activity: Sufficiently Active (07/06/2021)   Exercise Vital Sign  Days of Exercise per Week: 5 days    Minutes of Exercise per Session: 90 min  Stress: No Stress Concern Present (07/06/2021)   [place]    Feeling of Stress : Not at all  Social Connections: Moderately Integrated (07/06/2021)   Social Connection and Isolation Panel [NHANES]    Frequency of Communication with Friends and Family: More than three times a week    Frequency of Social Gatherings with Friends and Family: Twice a week    Attends Religious Services: More than 4 times per year    Active Member of [employer] or Organizations: No    Attends Music therapist: Never    Marital Status: Living with partner    Tobacco Counseling Counseling given: Not Answered   Clinical Intake:  Pre-visit preparation completed: Yes  Pain : No/denies pain     BMI - recorded: 20.63 Nutritional Status: BMI of 19-24  Normal Nutritional Risks: None Diabetes: No  How often do you need to have someone help you when you read instructions, pamphlets, or other written materials from your doctor or pharmacy?: 1 - Never  Diabetic?no  Interpreter Needed?: No  Information entered by :: Charlott Rakes, LPN   Activities of Daily Living    07/06/2021    2:25 PM  In your present state of health, do you have any  difficulty performing the following activities:  Hearing? 0  Vision? 0  Difficulty concentrating or making decisions? 0  Walking or climbing stairs? 0  Dressing or bathing? 0  Doing errands, shopping? 0  Preparing Food and eating ? N  Using the Toilet? N  In the past six months, have you accidently leaked urine? N  Do you have problems with loss of bowel control? N  Managing your Medications? N  Managing your Finances? N  Housekeeping or managing your Housekeeping? N    Patient Care Team: Marin Olp, MD as PCP - General (Family Medicine) Stanford Breed Denice Bors, MD as PCP - Cardiology (Cardiology) Opthamology, [facility] as Consulting Physician (Ophthalmology) Tanda Rockers, MD as Consulting Physician (Pulmonary Disease) Roseanne Kaufman, MD as Consulting Physician (Orthopedic Surgery) Martinique, Amy, MD as Consulting Physician (Dermatology) Edythe Clarity, [facility] (Pharmacist)  Indicate any recent Medical Services you may have received from other than [facility] providers in the past year (date may be approximate).     Assessment:   This is a routine wellness examination for Lynzee.  Hearing/Vision screen Hearing Screening - Comments:: Pt denies any hearing issues  Vision Screening - Comments:: Pt follows up with Dr Delman Cheadle for annul eye exams   Dietary issues and exercise activities discussed: Current Exercise Habits: Home exercise routine, Type of exercise: Other - see comments (yard work), Time (Minutes): > 60, Frequency (Times/Week): 5, Weekly Exercise (Minutes/Week): 0   Goals Addressed             This Visit's Progress    Patient Stated       Maintain health       Depression Screen    07/06/2021    2:21 PM 11/28/2020   10:48 AM 06/09/2020   11:56 AM 05/28/2020   12:54 PM 05/08/2019    9:24 AM 04/16/2019    4:22 PM 09/08/2018    8:17 AM  PHQ 2/9 Scores  PHQ - 2 Score 0 0 0 0 0 0 0  PHQ- 9 Score    0       Fall Risk  07/06/2021    2:23 PM 11/28/2020   10:48 AM  06/09/2020   11:58 AM 05/28/2020   12:54 PM 05/15/2019    1:19 PM  Fall Risk   Falls in the past year? 1 0 0 0 0  Number falls in past yr: 1 0 0 0 0  Injury with Fall? 1 0 0 0 0  Comment bruised from falling into counter      Risk for fall due to : Impaired vision  Impaired vision    Follow up Falls prevention discussed Falls evaluation completed Falls prevention discussed      FALL RISK PREVENTION PERTAINING TO THE HOME:  Any stairs in or around the home? No  If so, are there any without handrails? No  Home free of loose throw rugs in walkways, pet beds, electrical cords, etc? Yes but did have fall after stepping on cat  Adequate lighting in your home to reduce risk of falls? Yes   ASSISTIVE DEVICES UTILIZED TO PREVENT FALLS:  Life alert? No  Use of a cane, walker or w/c? No  Grab bars in the bathroom? No  Shower chair or bench in shower? No  Elevated toilet seat or a handicapped toilet? No   TIMED UP AND GO:  Was the test performed? No .  Cognitive Function:        07/06/2021    2:25 PM 06/09/2020   12:00 PM 05/08/2019    9:23 AM  6CIT Screen  What Year? 0 points 0 points 0 points  What month? 0 points 0 points 0 points  What time? 0 points  0 points  Count back from 20 0 points 0 points 0 points  Months in reverse 0 points 0 points 0 points  Repeat phrase 0 points 0 points 0 points  Total Score 0 points  0 points    Immunizations Immunization History  Administered Date(s) Administered   Fluad Quad(high Dose 65+) 10/28/2020   H1N1 01/02/2008   Influenza Split 10/14/2010, 10/07/2011   Influenza, High Dose Seasonal PF 11/05/2016, 10/28/2020   Influenza,inj,Quad PF,6+ Mos 10/02/2012, 10/16/2015, 11/07/2017, 10/05/2018, 10/30/2019   Influenza-Unspecified 09/25/2013, 10/15/2014, 11/07/2017   Moderna Covid-19 Vaccine Bivalent Booster 42yr & up 11/14/2020   Moderna Sars-Covid-2 Vaccination 02/09/2019, 03/12/2019, 09/05/2019, 05/12/2020   PNEUMOCOCCAL CONJUGATE-20  08/27/2020   Pneumococcal Conjugate-13 08/14/2013   Pneumococcal Polysaccharide-23 10/29/2011   Td 05/15/2019   Zoster Recombinat (Shingrix) 06/25/2017, 09/30/2017    TDAP status: Up to date  Flu Vaccine status: Up to date  Pneumococcal vaccine status: Up to date  Covid-19 vaccine status: Completed vaccines  Qualifies for Shingles Vaccine? Yes   Zostavax completed Yes   Shingrix Completed?: Yes  Screening Tests Health Maintenance  Topic Date Due   COLONOSCOPY (Pts [phone] coverage will need to be confirmed)  08/10/2021   INFLUENZA VACCINE  08/25/2021   MAMMOGRAM  03/19/2022   TETANUS/TDAP  05/14/2029   Pneumonia Vaccine 78Years old  Completed   DEXA SCAN  Completed   COVID-19 Vaccine  Completed   Hepatitis C Screening  Completed   Zoster Vaccines- Shingrix  Completed   HPV VACCINES  Aged Out    Health Maintenance  There are no preventive care reminders to display for this patient.  Colorectal cancer screening: Type of screening: Colonoscopy. Completed 08/11/18. Repeat every 3 years  Mammogram status: Completed 03/19/21. Repeat every year  Bone Density status: Completed 03/12/19. Results reflect: Bone density results: OSTEOPENIA. Repeat every 2 years.  Additional Screening:  Hepatitis C Screening:  Completed 01/29/15  Vision Screening: Recommended annual ophthalmology exams for early detection of glaucoma and other disorders of the eye. Is the patient up to date with their annual eye exam?  Yes  Who is the provider or what is the name of the office in which the patient attends annual eye exams? Dr Delman Cheadle  If pt is not established with a provider, would they like to be referred to a provider to establish care? No .   Dental Screening: Recommended annual dental exams for proper oral hygiene  Community Resource Referral / Chronic Care Management: CRR required this visit?  No   CCM required this visit?  No      Plan:     I have personally reviewed  and noted the following in the patient's chart:   Medical and social history Use of alcohol, tobacco or illicit drugs  Current medications and supplements including opioid prescriptions.  Functional ability and status Nutritional status Physical activity Advanced directives List of other physicians Hospitalizations, surgeries, and ER visits in previous 12 months Vitals Screenings to include cognitive, depression, and falls Referrals and appointments  In addition, I have reviewed and discussed with patient certain preventive protocols, quality metrics, and best practice recommendations. A written personalized care plan for preventive services as well as general preventive health recommendations were provided to patient.     Willette Brace, LPN   [id number]   Nurse Notes: None

## 2021-07-06 NOTE — Patient Instructions (Signed)
Belinda Day , Thank you for taking time to come for your Medicare Wellness Visit. I appreciate your ongoing commitment to your health goals. Please review the following plan we discussed and let me know if I can assist you in the future.   Screening recommendations/referrals: Colonoscopy: Done 08/11/18 repeat every 3 years  Mammogram: Done 04/03/21 repeat every year  Bone Density: Done 03/12/19 repeat every 2 years  Recommended yearly ophthalmology/optometry visit for glaucoma screening and checkup Recommended yearly dental visit for hygiene and checkup  Vaccinations: Influenza vaccine: Done 10/28/20 repeat every year  Pneumococcal vaccine: Up to date Tdap vaccine: Done 05/15/19 repeat every 10 years  Shingles vaccine: Completed 6/1, 09/30/17 Covid-19:Completed 1/15, 2/15, 09/05/19 & 4/18 & 11/14/20  Advanced directives: Please bring a copy of your health care power of attorney and living will to the office at your convenience.  Conditions/risks identified: Maintain health   Next appointment: Follow up in one year for your annual wellness visit    Preventive Care 65 Years and Older, Female Preventive care refers to lifestyle choices and visits with your health care provider that can promote health and wellness. What does preventive care include? A yearly physical exam. This is also called an annual well check. Dental exams once or twice a year. Routine eye exams. Ask your health care provider how often you should have your eyes checked. Personal lifestyle choices, including: Daily care of your teeth and gums. Regular physical activity. Eating a healthy diet. Avoiding tobacco and drug use. Limiting alcohol use. Practicing safe sex. Taking low-dose aspirin every day. Taking vitamin and mineral supplements as recommended by your health care provider. What happens during an annual well check? The services and screenings done by your health care provider during your annual well check will  depend on your age, overall health, lifestyle risk factors, and family history of disease. Counseling  Your health care provider may ask you questions about your: Alcohol use. Tobacco use. Drug use. Emotional well-being. Home and relationship well-being. Sexual activity. Eating habits. History of falls. Memory and ability to understand (cognition). Work and work Statistician. Reproductive health. Screening  You may have the following tests or measurements: Height, weight, and BMI. Blood pressure. Lipid and cholesterol levels. These may be checked every 5 years, or more frequently if you are over 23 years old. Skin check. Lung cancer screening. You may have this screening every year starting at age 30 if you have a 30-pack-year history of smoking and currently smoke or have quit within the past 15 years. Fecal occult blood test (FOBT) of the stool. You may have this test every year starting at age 49. Flexible sigmoidoscopy or colonoscopy. You may have a sigmoidoscopy every 5 years or a colonoscopy every 10 years starting at age 16. Hepatitis C blood test. Hepatitis B blood test. Sexually transmitted disease (STD) testing. Diabetes screening. This is done by checking your blood sugar (glucose) after you have not eaten for a while (fasting). You may have this done every 1-3 years. Bone density scan. This is done to screen for osteoporosis. You may have this done starting at age 55. Mammogram. This may be done every 1-2 years. Talk to your health care provider about how often you should have regular mammograms. Talk with your health care provider about your test results, treatment options, and if necessary, the need for more tests. Vaccines  Your health care provider may recommend certain vaccines, such as: Influenza vaccine. This is recommended every year. Tetanus, diphtheria, and  acellular pertussis (Tdap, Td) vaccine. You may need a Td booster every 10 years. Zoster vaccine. You may  need this after age 51. Pneumococcal 13-valent conjugate (PCV13) vaccine. One dose is recommended after age 65. Pneumococcal polysaccharide (PPSV23) vaccine. One dose is recommended after age 72. Talk to your health care provider about which screenings and vaccines you need and how often you need them. This information is not intended to replace advice given to you by your health care provider. Make sure you discuss any questions you have with your health care provider. Document Released: 02/07/2015 Document Revised: 10/01/2015 Document Reviewed: 11/12/2014 Elsevier Interactive Patient Education  2017 [place] Prevention in the Home Falls can cause injuries. They can happen to people of all ages. There are many things you can do to make your home safe and to help prevent falls. What can I do on the outside of my home? Regularly fix the edges of walkways and driveways and fix any cracks. Remove anything that might make you trip as you walk through a door, such as a raised step or threshold. Trim any bushes or trees on the path to your home. Use bright outdoor lighting. Clear any walking paths of anything that might make someone trip, such as rocks or tools. Regularly check to see if handrails are loose or broken. Make sure that both sides of any steps have handrails. Any raised decks and porches should have guardrails on the edges. Have any leaves, snow, or ice cleared regularly. Use sand or salt on walking paths during winter. Clean up any spills in your garage right away. This includes oil or grease spills. What can I do in the bathroom? Use night lights. Install grab bars by the toilet and in the tub and shower. Do not use towel bars as grab bars. Use non-skid mats or decals in the tub or shower. If you need to sit down in the shower, use a plastic, non-slip stool. Keep the floor dry. Clean up any water that spills on the floor as soon as it happens. Remove soap buildup in  the tub or shower regularly. Attach bath mats securely with double-sided non-slip rug tape. Do not have throw rugs and other things on the floor that can make you trip. What can I do in the bedroom? Use night lights. Make sure that you have a light by your bed that is easy to reach. Do not use any sheets or blankets that are too big for your bed. They should not hang down onto the floor. Have a firm chair that has side arms. You can use this for support while you get dressed. Do not have throw rugs and other things on the floor that can make you trip. What can I do in the kitchen? Clean up any spills right away. Avoid walking on wet floors. Keep items that you use a lot in easy-to-reach places. If you need to reach something above you, use a strong step stool that has a grab bar. Keep electrical cords out of the way. Do not use floor polish or wax that makes floors slippery. If you must use wax, use non-skid floor wax. Do not have throw rugs and other things on the floor that can make you trip. What can I do with my stairs? Do not leave any items on the stairs. Make sure that there are handrails on both sides of the stairs and use them. Fix handrails that are broken or loose. Make sure that  handrails are as long as the stairways. Check any carpeting to make sure that it is firmly attached to the stairs. Fix any carpet that is loose or worn. Avoid having throw rugs at the top or bottom of the stairs. If you do have throw rugs, attach them to the floor with carpet tape. Make sure that you have a light switch at the top of the stairs and the bottom of the stairs. If you do not have them, ask someone to add them for you. What else can I do to help prevent falls? Wear shoes that: Do not have high heels. Have rubber bottoms. Are comfortable and fit you well. Are closed at the toe. Do not wear sandals. If you use a stepladder: Make sure that it is fully opened. Do not climb a closed  stepladder. Make sure that both sides of the stepladder are locked into place. Ask someone to hold it for you, if possible. Clearly mark and make sure that you can see: Any grab bars or handrails. First and last steps. Where the edge of each step is. Use tools that help you move around (mobility aids) if they are needed. These include: Canes. Walkers. Scooters. Crutches. Turn on the lights when you go into a dark area. Replace any light bulbs as soon as they burn out. Set up your furniture so you have a clear path. Avoid moving your furniture around. If any of your floors are uneven, fix them. If there are any pets around you, be aware of where they are. Review your medicines with your doctor. Some medicines can make you feel dizzy. This can increase your chance of falling. Ask your doctor what other things that you can do to help prevent falls. This information is not intended to replace advice given to you by your health care provider. Make sure you discuss any questions you have with your health care provider. Document Released: 11/07/2008 Document Revised: 06/19/2015 Document Reviewed: 02/15/2014 Elsevier Interactive Patient Education  2017 [employer].

## 2021-08-10 DIAGNOSIS — L910 Hypertrophic scar: Secondary | ICD-10-CM | POA: Diagnosis not present

## 2021-08-10 DIAGNOSIS — Z85828 Personal history of other malignant neoplasm of skin: Secondary | ICD-10-CM | POA: Diagnosis not present

## 2021-08-10 DIAGNOSIS — L570 Actinic keratosis: Secondary | ICD-10-CM | POA: Diagnosis not present

## 2021-08-25 ENCOUNTER — Ambulatory Visit: Payer: Medicare Other | Admitting: Internal Medicine

## 2021-08-27 ENCOUNTER — Other Ambulatory Visit: Payer: Self-pay | Admitting: *Deleted

## 2021-08-27 MED ORDER — AZITHROMYCIN 250 MG PO TABS: 250.0000 mg | ORAL_TABLET | Freq: Every day | ORAL | 5 refills | Status: DC

## 2021-08-30 NOTE — Progress Notes (Signed)
Subjective:    Patient ID: Belinda Day, female   DOB: [birth year]    MRN: [id number]   Brief patient profile:  76 yowf MM/ quit smoking 1972 with documented right middle lobe syndrome and evidence of bronchiectasis by CT scan in March 2002  And GOLD II criteria for copd 09/2010     History of Present Illness  78/14/09 FOB with classic cobblestoning and MAI on culture.   78/21/09 given Levaquin x 10 days with resolution bloody mucus, but "felt she had flu the whole time" with aches, feverish   August 4, 76 ov: first post bronch co still coughing up mucus clear and initiate rx with symbicort/ [place] [facility] [place]   September 24, 78 ov no cough , sob, feeling great but no improvement on cxr   November 12, 78 ov feeling great, minimal am cough not productive. No sob.   Opth eval, labs ok 10/2007   August 13, 76 ov overall better over the last year, less tendency to exac on zmax and ethambutol. rec complete another year > satisfied improved 90% and stopped zmax and eth 78/2011     78/22/19 rec zmax daily but did not do  78/21/2019  f/u ov/Shina Wass re: obst bronchiectasis/ confused with details of care / taking zpak prn but also has omnicef and not sure what to do with it  Chief Complaint  Patient presents with   Follow-up    no current problems   Dyspnea:  yardwork ok / MMRC1 = can walk nl pace, flat grade, can't hurry or go uphills or steps s sob   Cough: variable esp in am / using flutter and vest prn / mucus beige and about a tbsp in am s heme Sleeping:  Bed flat and one big pilow  SABA use: rarely needed  rec Plan A = Automatic = symbicort 160  And take zmax daily  Plan B = Backup for  breathing Only use your albuterol as a rescue medication Plan B = for mucus if Nastier than nl especially assoc with any fever >  omnicef 300 mg twice daily x 7days Plan C = Call me if needed and A and B aren't working well   cxr on return    78/02/2019  f/u ov/Nishi Neiswonger re: bronchiectasis  obstructive  maint on symbicort 160 2bid and zmax daily  Chief Complaint  Patient presents with   Follow-up    sob on exertion and productive cough with yellow sputum  Dyspnea: yardwork  Cough: some worse in am/ no blood on zmax one daily minimally dicolored  Sleeping: on flat bed with big pillow  SABA use: not using  02: none  rec I will check to see what the cheapest alternative is for symb [place] to taper the pm dose symbicort off       78/01/2020  f/u ov/Atlas Kuc re: obstructive bronchiectasis maint on dulera 200/ zmax daily  Chief Complaint  Patient presents with   Follow-up    No complaints or concerns... Patient is doing well   Dyspnea:  lots of yardwork is main activity Cough: some am congestion/ pale yellow thick  Sleeping: flat bed, one big pillow  SABA use: rarely 02: none  Covid status:   vax x 4  Some pm HB, not using bed blocks just pepcid prn Rec Add pepcid 20 mg after supper  GERD diet         78/07/2021  f/u ov/Delsy Etzkorn re: obstructive bronchiectasis  maint on  dulera  200/ zmax daily  Chief Complaint  Patient presents with   Follow-up    Breathing is unchanged and no new co's. She is using her albuterol inhaler 2-3 x per wk. She has noticed some difficulty swallowing recently.   Dyspnea:  yardwork when cool Cough: mainly in am  Sleeping: flat bed / one pillow SABA use: rare 02: none Covid status:   up to date   No obvious day to day or daytime variability or assoc excess/ purulent sputum or mucus plugs or hemoptysis or cp or chest tightness, subjective wheeze or overt sinus or hb symptoms.   Sleeping  without nocturnal  or early am exacerbation  of respiratory  c/o's or need for noct saba. Also denies any obvious fluctuation of symptoms with weather or environmental changes or other aggravating or alleviating factors except as outlined above   No unusual exposure hx or h/o childhood pna/ asthma or knowledge of premature birth.  Current Allergies, Complete  Past Medical History, Past Surgical History, Family History, and Social History were reviewed in [employer] record.  ROS  The following are not active complaints unless bolded Hoarseness, sore throat, dysphagia = slt globus sensation on high dose ICS, dental problems, itching, sneezing,  nasal congestion or discharge of excess mucus or purulent secretions, ear ache,   fever, chills, sweats, unintended wt loss or wt gain, classically pleuritic or exertional cp,  orthopnea pnd or arm/hand swelling  or leg swelling, presyncope, palpitations, abdominal pain, anorexia, nausea, vomiting, diarrhea  or change in bowel habits or change in bladder habits, change in stools or change in urine, dysuria, hematuria,  rash, arthralgias, visual complaints, headache, numbness, weakness or ataxia or problems with walking or coordination,  change in mood or  memory.        Current Meds  Medication Sig   acetaminophen (TYLENOL) 650 MG CR tablet every 8 (eight) hours as needed for pain.    ALPRAZolam (XANAX) 0.25 MG tablet TAKE 1 TABLET BY MOUTH AT BEDTIME AS NEEDED FOR SLEEP. May take very sparingly for daytime anxiety at least 8 hours from nighttime dose. Do not drive for 8 hours after taking   amitriptyline (ELAVIL) 50 MG tablet Take 1 tablet (50 mg total) by mouth at bedtime.   aspirin EC 81 MG tablet Take 81 mg by mouth daily.   atorvastatin (LIPITOR) 80 MG tablet TAKE 1 TABLET(80 MG) BY MOUTH DAILY   azithromycin (ZITHROMAX) 250 MG tablet Take 1 tablet (250 mg total) by mouth daily.   Dextromethorphan-Guaifenesin (MUCINEX DM) 30-600 MG TB12 Take 1 tablet by mouth daily. Patient takes '1200mg'$    Melatonin 5 MG TABS Take 5 mg by mouth at bedtime.   metFORMIN (GLUCOPHAGE) 500 MG tablet TAKE 1 TABLET(500 MG) BY MOUTH TWICE DAILY WITH A MEAL   mometasone-formoterol (DULERA) 200-5 MCG/ACT AERO INHALE 2 PUFFS INTO THE LUNGS EVERY MORNING AND EVERY NIGHT AT BEDTIME   Multiple Vitamin (MULTIVITAMIN)  tablet Take 1 tablet by mouth daily.   nebivolol (BYSTOLIC) 2.5 MG tablet Take 1 tablet (2.5 mg total) by mouth daily.   Respiratory Therapy Supplies (FLUTTER) DEVI Use as directed   SYNTHROID 75 MCG tablet TAKE 1 TABLET(75 MCG) BY MOUTH DAILY BEFORE AND BREAKFAST   telmisartan (MICARDIS) 80 MG tablet TAKE 1 TABLET(80 MG) BY MOUTH DAILY   VITAMIN D, CHOLECALCIFEROL, PO Take by mouth.   [DISCONTINUED] albuterol (PROAIR HFA) 108 (90 Base) MCG/ACT inhaler Inhale 2 puffs into the lungs every 6 (six)  hours as needed for wheezing or shortness of breath.                        Past Medical History:  Bronchiectasis see CT SE 04/13/00  - HFA 75% November 19, 2009  - alpha one screen 01/14/2015 >  MM  - IgE 01/14/2015 = 11  MAI  - Rx Zmax and ETH 08/29/07 > 78/2011 restarted empirically 05/31/14 > 09/03/14 (no change in cough so just use zpak for flares)  - Rx zmax maint  10/16/2015 >>> d/c 08/03/2016 > restarted 78/22/19 and clinically improved 08/02/2017 using prn omnicef to supplment  - Eye eval   10[place] so try cycles of cipro April 02, 2010  HEALTH MAINTENANCE...........................[name] - Td 10/2007  - Pneumovax 2005   and 10/29/2011 age 69, prevnar 08/16/2013  CAD  Hyperlipidemia  Hypertension  History of cough with ACE inhibition.  Gastroesophageal reflux disease         Objective:   Physical Exam  Wts  78/07/2021    114 78/01/2020    110    02/26/2020   116 78/02/2019    119 02/16/2019  124 08/15/2018  123  Wt 130 September 24, 78>140 March 31, 2010 > 127 07/16/2010 > 10/14/2010  120 > 05/04/2011  117 > 07/30/2011  120 > 10/29/2011 118 > 130  05/01/2012 > 12/12/2012 132 >  08/14/13 137 >    02/20/2014  137 >  05/31/2014 134 > 09/03/2014    140 > 10/15/2014 137 > 01/14/2015 137 >  05/15/2015 123 >08/14/2015  124 >  10/16/2015 126 > 03/02/2016   124  > 05/04/2016  126 > 08/03/2016   130 > 02/14/2017  126 > 05/16/2017 128 > 08/02/2017  119 > 78/21/2019  116     Vital  signs reviewed  78/07/2021  - Note at rest 02 sats  99% on RA   General appearance:    very pleasant amb slt hoarse wf nad    HEENT :  Oropharynx  clear  Nasal turbinates nl    NECK :  without JVD/Nodes/TM/ nl carotid upstrokes bilaterally   LUNGS: no acc muscle use,  Mod barrel  contour chest wall with bilateral  Distant bs s audible wheeze and  without cough on insp or exp maneuvers and mod  Hyperresonant  to  percussion bilaterally     CV:  RRR  no s3 or murmur or increase in P2, and no edema   ABD:  soft and nontender with pos mid insp Hoover's  in the supine position. No bruits or organomegaly appreciated, bowel sounds nl  MS:   Ext warm without deformities or   obvious joint restrictions , calf tenderness, cyanosis or clubbing  SKIN: warm and dry without lesions    NEURO:  alert, approp, nl sensorium with  no motor or cerebellar deficits apparent.         CXR PA and Lateral:   78/07/2021 :    I personally reviewed images and agree with radiology impression as follows:    1. No active cardiopulmonary disease. 2. Stable chronic bronchiectasis and fibrosis in bilateral lungs.               Assessment:

## 2021-08-31 ENCOUNTER — Ambulatory Visit (INDEPENDENT_AMBULATORY_CARE_PROVIDER_SITE_OTHER): Payer: Medicare Other

## 2021-08-31 ENCOUNTER — Ambulatory Visit: Payer: Medicare Other | Admitting: Internal Medicine

## 2021-08-31 ENCOUNTER — Encounter: Payer: Self-pay | Admitting: Internal Medicine

## 2021-08-31 DIAGNOSIS — A310 Pulmonary mycobacterial infection: Secondary | ICD-10-CM | POA: Diagnosis not present

## 2021-08-31 DIAGNOSIS — M13841 Other specified arthritis, right hand: Secondary | ICD-10-CM | POA: Diagnosis not present

## 2021-08-31 DIAGNOSIS — M654 Radial styloid tenosynovitis [de Quervain]: Secondary | ICD-10-CM | POA: Diagnosis not present

## 2021-08-31 DIAGNOSIS — J479 Bronchiectasis, uncomplicated: Secondary | ICD-10-CM | POA: Diagnosis not present

## 2021-08-31 DIAGNOSIS — Z4789 Encounter for other orthopedic aftercare: Secondary | ICD-10-CM | POA: Diagnosis not present

## 2021-08-31 DIAGNOSIS — M65332 Trigger finger, left middle finger: Secondary | ICD-10-CM | POA: Diagnosis not present

## 2021-08-31 DIAGNOSIS — M65321 Trigger finger, right index finger: Secondary | ICD-10-CM | POA: Diagnosis not present

## 2021-08-31 MED ORDER — ALBUTEROL SULFATE HFA 108 (90 BASE) MCG/ACT IN AERS
2.0000 | INHALATION_SPRAY | Freq: Four times a day (QID) | RESPIRATORY_TRACT | 2 refills | Status: AC | PRN
Start: 2021-08-31 — End: ?

## 2021-08-31 MED ORDER — MOMETASONE FURO-FORMOTEROL FUM 100-5 MCG/ACT IN AERO: INHALATION_SPRAY | RESPIRATORY_TRACT | 11 refills | Status: DC

## 2021-08-31 NOTE — Assessment & Plan Note (Signed)
Onset of symptoms around 2002     - PFT's 10/14/2010  FEV1  1.25 (68%) and ratio 58% and DLCO 90%     - PFT's 10/29/2011  FEV1  1.33 (74%) and ratio 57 % and DLCO 93%    - PFTs 08/14/2013   FEV1  1.16 (60%) and ratio 61 with dlco 83%     - Flutter valve added 09/03/14      - alpha one   01/14/2015 >  MM, level 147     - IgE  01/14/15  11 - CT chest 04/17/15 Marked chronic bronchiectasis and volume loss in the right middle lobe with milder bronchiectasis, bronchial wall thickening, and nodular densities throughout the right upper and right lower lobe suggestive of chr onic endobronchial/atypical mycobacterial infection. - 10/16/2015 changed to symbicort 80 2bid (? Higher doses contributing to w MAI /freq of infections)   - 03/02/2016  After extensive coaching HFA effectiveness =    90%  - 05/04/2016 VEST stared around May 25 2016 - try off zmax 08/03/2016 > no change clinically as of 11/05/2016  - 02/14/2017 cycles of omnicef x 7 days prn purulent sputum  PFT's  05/16/2017  FEV1 0.89 (47 % ) ratio 60  p 12 % improvement from saba p nothing prior to study   - 05/16/2017  After extensive coaching inhaler device  effectiveness =    90%  - 05/16/17 quant Ig's ok x M slt low (not acutely ill at the time)  -zmax daily restarted 05/16/17 and clinically improved 08/02/2017 using prn omnicef to supplment but did not continue zmax as rec  - restart zmax daily 11/14/2017 > improved 02/14/2018  - 08/25/2020  After extensive coaching inhaler device,  effectiveness =    95%  > continue symbicort 160 and prn saba - 08/31/2021 rec try dulera 100 to reduce irritation upper airway and increase immune response to potential infections    Discussed in detail all the  indications, usual  risks and alternatives  relative to the benefits with patient who agrees to proceed with Rx as outlined.      F/u can be yearly  - sooner if needed

## 2021-08-31 NOTE — Assessment & Plan Note (Signed)
08/08/07 FOB with classic cobblestoning and MAI on culture.  - Rx 08/2007   To 08/2009 with ETH/Zmax - restarted empirically 05/31/14 > stopped 09/03/14 no benefit perceived, no change on cxr  - restart zmax daily 03/02/2016 > d/c 08/03/2016 > no change in symptoms or freq exac - -zmax daily restarted 05/16/17 and clinically improved 08/02/2017 using prn omnicef to supplment   No clinical or radiographic evidence of dz progression/ continue zmax and reduce ICS to improve immune response in airways          Each maintenance medication was reviewed in detail including emphasizing most importantly the difference between maintenance and prns and under what circumstances the prns are to be triggered using an action plan format where appropriate.  Total time for H and P, chart review, counseling, reviewing hfa device(s) and generating customized AVS unique to this office visit / same day charting = 28 min

## 2021-08-31 NOTE — Patient Instructions (Addendum)
You will need covid update, RSV and flu shots this fall   Try dulera 100 Take 2 puffs first thing in am and then another 2 puffs about 12 hours later.    Please remember to go to the  x-ray department  for your tests - we will call you with the results when they are available    Please schedule a follow up visit in 12 months but call sooner if needed

## 2021-09-01 ENCOUNTER — Encounter: Payer: Self-pay | Admitting: Family Medicine

## 2021-09-10 DIAGNOSIS — M654 Radial styloid tenosynovitis [de Quervain]: Secondary | ICD-10-CM | POA: Diagnosis not present

## 2021-09-10 DIAGNOSIS — M65321 Trigger finger, right index finger: Secondary | ICD-10-CM | POA: Diagnosis not present

## 2021-09-22 DIAGNOSIS — M79641 Pain in right hand: Secondary | ICD-10-CM | POA: Diagnosis not present

## 2021-09-30 ENCOUNTER — Telehealth: Payer: Self-pay | Admitting: Pharmacist

## 2021-09-30 DIAGNOSIS — K5730 Diverticulosis of large intestine without perforation or abscess without bleeding: Secondary | ICD-10-CM | POA: Diagnosis not present

## 2021-09-30 DIAGNOSIS — Z09 Encounter for follow-up examination after completed treatment for conditions other than malignant neoplasm: Secondary | ICD-10-CM | POA: Diagnosis not present

## 2021-09-30 DIAGNOSIS — Z86010 Personal history of colonic polyps: Secondary | ICD-10-CM | POA: Diagnosis not present

## 2021-09-30 DIAGNOSIS — D122 Benign neoplasm of ascending colon: Secondary | ICD-10-CM | POA: Diagnosis not present

## 2021-09-30 DIAGNOSIS — D120 Benign neoplasm of cecum: Secondary | ICD-10-CM | POA: Diagnosis not present

## 2021-09-30 LAB — HM COLONOSCOPY

## 2021-09-30 NOTE — Progress Notes (Signed)
Chronic Care Management Pharmacy Assistant   Name: Belinda Day  MRN: [id number] DOB: 1945-04-26   Reason for Encounter: General Adherence Call    Recent office visits:  None since last CPP visit  Recent consult visits:  08/31/2021 OV (Pulmonology) Tanda Rockers, MD; 08/31/2021 rec try dulera 100 to reduce irritation upper airway and increase immune response to potential infections  Hospital visits:  None in previous 6 months  Medications: Outpatient Encounter Medications as of 09/30/2021  Medication Sig   acetaminophen (TYLENOL) 650 MG CR tablet every 8 (eight) hours as needed for pain.    albuterol (PROAIR HFA) 108 (90 Base) MCG/ACT inhaler Inhale 2 puffs into the lungs every 6 (six) hours as needed for wheezing or shortness of breath.   ALPRAZolam (XANAX) 0.25 MG tablet TAKE 1 TABLET BY MOUTH AT BEDTIME AS NEEDED FOR SLEEP. May take very sparingly for daytime anxiety at least 8 hours from nighttime dose. Do not drive for 8 hours after taking   amitriptyline (ELAVIL) 50 MG tablet Take 1 tablet (50 mg total) by mouth at bedtime.   aspirin EC 81 MG tablet Take 81 mg by mouth daily.   atorvastatin (LIPITOR) 80 MG tablet TAKE 1 TABLET(80 MG) BY MOUTH DAILY   azithromycin (ZITHROMAX) 250 MG tablet Take 1 tablet (250 mg total) by mouth daily.   Dextromethorphan-Guaifenesin (MUCINEX DM) 30-600 MG TB12 Take 1 tablet by mouth daily. Patient takes '1200mg'$    Melatonin 5 MG TABS Take 5 mg by mouth at bedtime.   metFORMIN (GLUCOPHAGE) 500 MG tablet TAKE 1 TABLET(500 MG) BY MOUTH TWICE DAILY WITH A MEAL   mometasone-formoterol (DULERA) 100-5 MCG/ACT AERO Take 2 puffs first thing in am and then another 2 puffs about 12 hours later.   Multiple Vitamin (MULTIVITAMIN) tablet Take 1 tablet by mouth daily.   nebivolol (BYSTOLIC) 2.5 MG tablet Take 1 tablet (2.5 mg total) by mouth daily.   Respiratory Therapy Supplies (FLUTTER) DEVI Use as directed   SYNTHROID 75 MCG tablet TAKE 1 TABLET(75 MCG)  BY MOUTH DAILY BEFORE AND BREAKFAST   telmisartan (MICARDIS) 80 MG tablet TAKE 1 TABLET(80 MG) BY MOUTH DAILY   VITAMIN D, CHOLECALCIFEROL, PO Take by mouth.   No facility-administered encounter medications on file as of 09/30/2021.   [place] for General Review Call   Chart Review:  Have there been any documented new, changed, or discontinued medications since last visit? No  Has there been any documented recent hospitalizations or ED visits since last visit with Clinical Pharmacist? No   Adherence Review:  Does the Clinical Pharmacist Assistant have access to adherence rates? Yes Adherence rates for STAR metric medications: Atorvastatin 80 mg last filled 07/21/2021 90 DS Metformin 500 mg last filled 08/31/2021 90 DS Telmisartan 80 mg last filled 08/19/2021 90 DS Does the patient have >5 day gap between last estimated fill dates for any of the above medications or other medication gaps? No Reason for medication gaps. None   Disease State Questions:  Able to connect with Patient? Yes Did patient have any problems with their health recently? No Have you had any admissions or emergency room visits or worsening of your condition(s) since last visit? No Have you had any visits with new specialists or providers since your last visit? No Have you had any new health care problem(s) since your last visit? No Have you run out of any of your medications since you last spoke with clinical pharmacist? No Are there  any medications you are not taking as prescribed? No Are you having any issues or side effects with your medications? No Do you have any other health concerns or questions you want to discuss with your Clinical Pharmacist before your next visit? No Are there any health concerns that you feel we can do a better job addressing? No Are you having any problems with any of the following since the last visit: (select all that apply)  None 12. Any falls since last visit?  No 13. Any increased or uncontrolled pain since last visit? No   Patient scheduled a follow up telephone visit with clinical pharmacist on 01/04/2022 at 9:45 am.  Care Gaps: Medicare Annual Wellness: Completed 06/30/2021 Hemoglobin A1C: 6.3% on 06/01/2021 Colonoscopy: Overdue since 08/10/2021 Dexa Scan: Completed Mammogram: Next due on 03/19/2022 HPV Vaccines: Aged Out  Future Appointments  Date Time Provider [place]  12/02/2021  9:20 AM Marin Olp, MD [facility]  01/04/2022  9:45 AM [facility] CCM PHARMACIST [facility]  07/12/2022  1:45 PM [facility] HEALTH COACH [facility]  09/01/2022  9:30 AM Tanda Rockers, MD [facility] None    Star Rating Drugs: Atorvastatin 80 mg last filled 07/21/2021 90 DS Metformin 500 mg last filled 08/31/2021 90 DS Telmisartan 80 mg last filled 08/19/2021 90 DS  April D Calhoun, [place] Pharmacist Assistant [phone]

## 2021-10-01 ENCOUNTER — Telehealth: Payer: Self-pay

## 2021-10-01 NOTE — Telephone Encounter (Signed)
-----   Message from Anne Fu sent at 10/01/2021 10:48 AM EDT ----- Regarding: FW: Referral for EMR  ----- Message ----- From: Irving Copas., MD Sent: 10/01/2021   9:59 AM EDT To: Anne Fu Subject: RE: Referral for EMR                           Please make sure that this referral, has colonoscopy reports and pathology attached (ideally we get color photos, but if not at least black/white photos and please ensure that their office sends in color photos as well).  It is hard for me to know what I can offer/cannot without this information.  Usually these referrals do not have this included, so it is not helpful otherwise. Thank you. GM ----- Message ----- From: Anne Fu Sent: 10/01/2021   8:17 AM EDT To: Irving Copas., MD Subject: Referral for EMR                               Good Morning Dr.Mansouraty.   Please see referral from [facility] for EMR. Referral was placed on your desk.   Thank you!

## 2021-10-02 DIAGNOSIS — D122 Benign neoplasm of ascending colon: Secondary | ICD-10-CM | POA: Diagnosis not present

## 2021-10-02 DIAGNOSIS — D120 Benign neoplasm of cecum: Secondary | ICD-10-CM | POA: Diagnosis not present

## 2021-10-02 NOTE — Telephone Encounter (Signed)
Message left for [facility] medical records to provide the recommended records.

## 2021-10-05 DIAGNOSIS — M79641 Pain in right hand: Secondary | ICD-10-CM | POA: Diagnosis not present

## 2021-10-12 NOTE — Telephone Encounter (Signed)
There is black and white records in Dr Mansouraty's inbox.

## 2021-10-12 NOTE — Telephone Encounter (Signed)
I will pass by my office on 9/19 to see the records. Thanks. GM

## 2021-10-12 NOTE — Telephone Encounter (Signed)
Dr Rush Landmark are the records on your desk the correct ones you are looking for?

## 2021-10-12 NOTE — Telephone Encounter (Signed)
I have not received any records. Elmyra Ricks have you seen any?

## 2021-10-14 DIAGNOSIS — H5203 Hypermetropia, bilateral: Secondary | ICD-10-CM | POA: Diagnosis not present

## 2021-10-14 DIAGNOSIS — E119 Type 2 diabetes mellitus without complications: Secondary | ICD-10-CM | POA: Diagnosis not present

## 2021-10-14 DIAGNOSIS — H52203 Unspecified astigmatism, bilateral: Secondary | ICD-10-CM | POA: Diagnosis not present

## 2021-10-14 DIAGNOSIS — H2513 Age-related nuclear cataract, bilateral: Secondary | ICD-10-CM | POA: Diagnosis not present

## 2021-10-14 LAB — HM DIABETES EYE EXAM

## 2021-10-16 NOTE — Telephone Encounter (Signed)
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  Have sent a message to Dr. Michail Sermon about timing for procedures being scheduled and whether he will want patient to be referred here still versus sending back to [facility] where she had previously had endoscopic resection attempt, but looks like it has recurred since.  Holding on clinic visit being scheduled for now and procedure visit.

## 2021-10-19 ENCOUNTER — Telehealth: Payer: Self-pay

## 2021-10-19 ENCOUNTER — Encounter: Payer: Self-pay | Admitting: *Deleted

## 2021-10-19 DIAGNOSIS — Z85828 Personal history of other malignant neoplasm of skin: Secondary | ICD-10-CM | POA: Diagnosis not present

## 2021-10-19 DIAGNOSIS — L718 Other rosacea: Secondary | ICD-10-CM | POA: Diagnosis not present

## 2021-10-19 DIAGNOSIS — L853 Xerosis cutis: Secondary | ICD-10-CM | POA: Diagnosis not present

## 2021-10-19 NOTE — Telephone Encounter (Signed)
-----   Message from Irving Copas., MD sent at 10/18/2021  9:09 AM EDT ----- Regarding: RE: Follow up Belinda Day, Thank you for the update. We will go ahead and get her scheduled for a clinic visit and discuss timing of repeat colonoscopy attempt.   Arleth Mccullar or covering RN, Please go ahead and schedule this patient a clinic visit to discuss repeat attempt at IC valve polyp resection previously removed at [facility] and found to be present again on recent colonoscopy. Please let Dr. Michail Sermon and I know when she is scheduled for her clinic visit. Thanks. GM ----- Message ----- From: Wilford Corner, MD Sent: 10/17/2021   3:29 PM EDT To: Irving Copas., MD Subject: RE: Follow up                                   She's ok to wait. Please go ahead and schedule her when you can get her in. Doubt [facility] has anything before December either and she prefers to stay in [place] for it.   Thanks VS ----- Message ----- From: Irving Copas., MD Sent: 10/16/2021   4:54 AM EDT To: Wilford Corner, MD Subject: RE: Follow up                                   VS, Looking into end of November and December. With DJ being things have become much more full. If you think she is okay to wait I am happy to be available for her otherwise she can go back to [facility]. Let me know what you decide. Thanks. GM ----- Message ----- From: Wilford Corner, MD Sent: 10/14/2021  11:45 AM EDT To: Irving Copas., MD Subject: RE: Follow up                                   Lesion has recurred on ICV. She preferred to stay local to have it done based on logistics but if you prefer her to go back to [facility] she would be ok with that I think. Let me know also I don't know why you are not receiving the records my nurse sent them twice.   Thanks Belinda Day ----- Message ----- From: Irving Copas., MD Sent: 10/10/2021   6:31 AM EDT To: Wilford Corner, MD Subject: Follow up                                        VS, We haven't gotten the pathology/colonoscopy report on this patient. Looking through old records, this patient seems to have have a lesion at the ICV worked on previously at [facility] by Arsenio Loader. If this is the same lesion, she probably should go back to them to work on this. Let me know, my team still waiting on this information. Thanks. GM              Note       Questionnaires

## 2021-10-19 NOTE — Telephone Encounter (Signed)
Office appt made for 12/01/21 at 2:303 pm.  Pt has been mailed a letter with appt information.  Also, sent a note to My Chart

## 2021-10-19 NOTE — Telephone Encounter (Signed)
See alternate phone note dated 9/25

## 2021-10-19 NOTE — Telephone Encounter (Signed)
-----   Message from Irving Copas., MD sent at 10/18/2021  9:09 AM EDT ----- Regarding: RE: Follow up Vince, Thank you for the update. We will go ahead and get her scheduled for a clinic visit and discuss timing of repeat colonoscopy attempt.  Jakeira Seeman or covering RN, Please go ahead and schedule this patient a clinic visit to discuss repeat attempt at IC valve polyp resection previously removed at [facility] and found to be present again on recent colonoscopy. Please let Dr. Michail Sermon and I know when she is scheduled for her clinic visit. Thanks. GM ----- Message ----- From: Wilford Corner, MD Sent: 10/17/2021   3:29 PM EDT To: Irving Copas., MD Subject: RE: Follow up                                  She's ok to wait. Please go ahead and schedule her when you can get her in. Doubt [facility] has anything before December either and she prefers to stay in [place] for it.  Thanks VS ----- Message ----- From: Irving Copas., MD Sent: 10/16/2021   4:54 AM EDT To: Wilford Corner, MD Subject: RE: Follow up                                  VS, Looking into end of November and December. With DJ being things have become much more full. If you think she is okay to wait I am happy to be available for her otherwise she can go back to [facility] Let me know what you decide. Thanks. GM ----- Message ----- From: Wilford Corner, MD Sent: 10/14/2021  11:45 AM EDT To: Irving Copas., MD Subject: RE: Follow up                                  Lesion has recurred on ICV. She preferred to stay local to have it done based on logistics but if you prefer her to go back to [facility] she would be ok with that I think. Let me know also I don't know why you are not receiving the records my nurse sent them twice.  Thanks Vince ----- Message ----- From: Irving Copas., MD Sent: 10/10/2021   6:31 AM EDT To: Wilford Corner, MD Subject: Follow up                                       VS, We haven't gotten the pathology/colonoscopy report on this patient. Looking through old records, this patient seems to have have a lesion at the ICV worked on previously at [facility] by Arsenio Loader. If this is the same lesion, she probably should go back to them to work on this. Let me know, my team still waiting on this information. Thanks. GM

## 2021-10-20 ENCOUNTER — Other Ambulatory Visit: Payer: Self-pay

## 2021-10-20 MED ORDER — ATORVASTATIN CALCIUM 80 MG PO TABS: ORAL_TABLET | ORAL | 1 refills | Status: DC

## 2021-10-22 DIAGNOSIS — M79641 Pain in right hand: Secondary | ICD-10-CM | POA: Diagnosis not present

## 2021-10-25 ENCOUNTER — Other Ambulatory Visit: Payer: Self-pay | Admitting: Physician Assistant

## 2021-10-26 ENCOUNTER — Ambulatory Visit (INDEPENDENT_AMBULATORY_CARE_PROVIDER_SITE_OTHER): Payer: Medicare Other | Admitting: *Deleted

## 2021-10-26 DIAGNOSIS — Z23 Encounter for immunization: Secondary | ICD-10-CM

## 2021-11-04 DIAGNOSIS — M79641 Pain in right hand: Secondary | ICD-10-CM | POA: Diagnosis not present

## 2021-11-15 ENCOUNTER — Other Ambulatory Visit: Payer: Self-pay | Admitting: Cardiology

## 2021-11-15 DIAGNOSIS — I10 Essential (primary) hypertension: Secondary | ICD-10-CM

## 2021-11-23 ENCOUNTER — Ambulatory Visit: Payer: Medicare Other | Admitting: Physician Assistant

## 2021-11-23 DIAGNOSIS — M79641 Pain in right hand: Secondary | ICD-10-CM | POA: Diagnosis not present

## 2021-12-01 ENCOUNTER — Ambulatory Visit: Payer: Medicare Other | Admitting: Gastroenterology

## 2021-12-01 ENCOUNTER — Encounter: Payer: Self-pay | Admitting: Gastroenterology

## 2021-12-01 VITALS — BP 110/78 | HR 80 | Ht 61.0 in | Wt 112.0 lb

## 2021-12-01 DIAGNOSIS — R933 Abnormal findings on diagnostic imaging of other parts of digestive tract: Secondary | ICD-10-CM | POA: Diagnosis not present

## 2021-12-01 DIAGNOSIS — Z86010 Personal history of colonic polyps: Secondary | ICD-10-CM | POA: Diagnosis not present

## 2021-12-01 DIAGNOSIS — Z800 Family history of malignant neoplasm of digestive organs: Secondary | ICD-10-CM

## 2021-12-01 DIAGNOSIS — D120 Benign neoplasm of cecum: Secondary | ICD-10-CM

## 2021-12-01 MED ORDER — ONDANSETRON 8 MG PO TBDP: ORAL_TABLET | ORAL | 0 refills | Status: DC

## 2021-12-01 MED ORDER — PEG 3350-KCL-NA BICARB-NACL 420 G PO SOLR: 4000.0000 mL | Freq: Once | ORAL | 0 refills | Status: AC

## 2021-12-01 NOTE — Patient Instructions (Signed)
We have sent the following medications to your pharmacy for you to pick up at your convenience: Golytely, Zofran   Please have your labs done at PCP office. Please also have your PCP office to fax results.   Milk of Magnesium for 2 days prior to procedure.   Zofran - Dissolve 1 tablet under the tongue 30 mins prior to starting the preparation for your colonoscopy.    You have been scheduled for a colonoscopy. Please follow written instructions given to you at your visit today.  Please pick up your prep supplies at the pharmacy within the next 1-3 days. If you use inhalers (even only as needed), please bring them with you on the day of your procedure.  _______________________________________________________  If you are age 34 or older, your body mass index should be between 23-30. Your Body mass index is 21.16 kg/m. If this is out of the aforementioned range listed, please consider follow up with your Primary Care Provider.  If you are age 31 or younger, your body mass index should be between 19-25. Your Body mass index is 21.16 kg/m. If this is out of the aformentioned range listed, please consider follow up with your Primary Care Provider.   ________________________________________________________  The [facility] providers would like to encourage you to use [facility] to communicate with providers for non-urgent requests or questions.  Due to long hold times on the telephone, sending your provider a message by [facility] may be a faster and more efficient way to get a response.  Please allow 48 business hours for a response.  Please remember that this is for non-urgent requests.  _______________________________________________________  Thank you for choosing me and [facility].  Dr. Rush Landmark

## 2021-12-01 NOTE — Progress Notes (Signed)
[place] VISIT   Primary Care Provider [address], [place] [address] [phone]  Referring Provider Dr. Michail Sermon  Patient Profile: Belinda Day is a 76 y.o. female with a pmh significant for CAD, hypertension, hyperlipidemia, hypothyroidism, GERD, diverticulosis (previous diverticulitis episodes), family history colon cancer (mother/father), colon polyps (TAs & recurrent ICV adenoma status post previous resection by [facility] and then [facility] and now with recurrence in 2023).  The patient presents to the [facility] for an evaluation and management of problem(s) noted below:  Problem List 1. Ileocecal valve adenoma   2. Hx of adenomatous colonic polyps   3. Abnormal colonoscopy   4. Family history of colon cancer     History of Present Illness This is the patient's first visit to the outpatient [facility] clinic.  The patient has a history of colon polyps and has undergone previous colonoscopy with advanced resection techniques at [facility] back in 2020 of an ICV polyp.  I do not have access to that colonoscopy report but pathology showed evidence of adenoma.  The patient recently underwent in September her surveillance colonoscopy she was found to have recurrence at the ileocecal valve with unsuccessful resection and tattooing and biopsy.  A 10 mm ascending colon polyp was removed as well.  Diverticulosis was found.  It is for the reason of the recurrent ICV adenomatous polyp that the patient is referred for consideration of further advanced resection techniques.  The patient otherwise has a history of some acid reflux issues but is otherwise stable.  She denies any dysphagia symptoms.  GI Review of Systems Positive as above Negative for odynophagia, nausea, vomiting, pain, alteration of bowel habits, melena, hematochezia  Review of Systems General: Denies fevers/chills/weight loss  unintentionally Cardiovascular: Denies chest pain Pulmonary: Denies shortness of breath Gastroenterological: See HPI Genitourinary: Denies darkened urine  Hematological: Denies easy bruising/bleeding Dermatological: Denies jaundice Psychological: Mood is stable   Medications Current Outpatient Medications  Medication Sig Dispense Refill   acetaminophen (TYLENOL) 650 MG CR tablet every 8 (eight) hours as needed for pain.      albuterol (PROAIR HFA) 108 (90 Base) MCG/ACT inhaler Inhale 2 puffs into the lungs every 6 (six) hours as needed for wheezing or shortness of breath. 1 each 2   amitriptyline (ELAVIL) 50 MG tablet Take 1 tablet (50 mg total) by mouth at bedtime. 90 tablet 3   aspirin EC 81 MG tablet Take 81 mg by mouth daily.     atorvastatin (LIPITOR) 80 MG tablet TAKE 1 TABLET(80 MG) BY MOUTH DAILY 90 tablet 1   azithromycin (ZITHROMAX) 250 MG tablet Take 1 tablet (250 mg total) by mouth daily. 30 tablet 5   Dextromethorphan-Guaifenesin (MUCINEX DM) 30-600 MG TB12 Take 1 tablet by mouth daily. Patient takes '1200mg'$  (Patient not taking: Reported on 12/02/2021)     Melatonin 5 MG TABS Take 5 mg by mouth at bedtime.     metFORMIN (GLUCOPHAGE) 500 MG tablet TAKE 1 TABLET(500 MG) BY MOUTH TWICE DAILY WITH A MEAL 180 tablet 3   mometasone-formoterol (DULERA) 100-5 MCG/ACT AERO Take 2 puffs first thing in am and then another 2 puffs about 12 hours later. 1 each 11   Multiple Vitamin (MULTIVITAMIN) tablet Take 1 tablet by mouth daily.     nebivolol (BYSTOLIC) 2.5 MG tablet TAKE 1 TABLET(2.5 MG) BY MOUTH DAILY 90 tablet 3   ondansetron (ZOFRAN-ODT) 8 MG disintegrating tablet Dissolve 1 tablet under the  tongue 30 mins prior to drinking preparation for colonoscopy. (Patient not taking: Reported on 12/02/2021) 2 tablet 0   Respiratory Therapy Supplies (FLUTTER) DEVI Use as directed 1 each 0   SYNTHROID 75 MCG tablet TAKE 1 TABLET(75 MCG) BY MOUTH DAILY BEFORE AND BREAKFAST 90 tablet 3    telmisartan (MICARDIS) 80 MG tablet TAKE 1 TABLET(80 MG) BY MOUTH DAILY 90 tablet 3   VITAMIN D, CHOLECALCIFEROL, PO Take by mouth.     ALPRAZolam (XANAX) 0.25 MG tablet TAKE 1 TABLET BY MOUTH AT BEDTIME AS NEEDED FOR SLEEP. May take very sparingly for daytime anxiety at least 8 hours from nighttime dose. Do not drive for 8 hours after taking 95 tablet 1   No current facility-administered medications for this visit.    Allergies Allergies  Allergen Reactions   Bactrim [Sulfamethoxazole-Trimethoprim] Hives, Itching and Other (See Comments)    Bruised like areas on body   Ciprofloxacin Other (See Comments)    Body aches   Codeine Nausea Only    REACTION: nausea   Erythromycin Nausea Only    REACTION: nausea   Levofloxacin Other (See Comments)    REACTION: aches    Histories Past Medical History:  Diagnosis Date   Bronchiectasis    oxygen at night in the past   CAD (coronary artery disease)    Diverticulitis 2014   GERD (gastroesophageal reflux disease)    History of shingles 04/2012   HTN (hypertension)    Hyperlipidemia    Hypothyroidism    MAI (mycobacterium avium-intracellulare) (HCC)    Neuritis of upper extremity    Past Surgical History:  Procedure Laterality Date   CORONARY ARTERY BYPASS GRAFT  01/25/2002   x3 CABG   HAND SURGERY     Dr. Amedeo Plenty sept 2022- improved hand movement   Social History   Socioeconomic History   Marital status: Single    Spouse name: Not on file   Number of children: Not on file   Years of education: Not on file   Highest education level: Not on file  Occupational History   Occupation: Retired   Tobacco Use   Smoking status: Former    Packs/day: 1.00    Years: 20.00    Total pack years: 20.00    Types: Cigarettes    Quit date: 01/25/1970    Years since quitting: 51.8   Smokeless tobacco: Never  Substance and Sexual Activity   Alcohol use: No   Drug use: No   Sexual activity: Not on file  Other Topics Concern   Not on file   Social History Narrative   Family: Single never married, no children, cat and rehabs turtles and tortoise   Went to [employer] in [employer] and social work, some business courses at [employer], EMT for 6 years.    LIves alone. Completely independent.    Lives in retirement community.       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      Hobbies: kayaking, gardening- mows own lawn   Social Determinants of Health   Financial Resource Strain: Low Risk  (07/06/2021)   Overall Financial Resource Strain (CARDIA)    Difficulty of Paying Living Expenses: Not hard at all  Food Insecurity: No Food Insecurity (07/06/2021)   Hunger Vital Sign    Worried About Running Out of Food in the Last Year: Never true    Ran Out of Food in the Last Year: Never true  Transportation Needs: No Transportation  Needs (07/06/2021)   PRAPARE - Hydrologist (Medical): No    Lack of Transportation (Non-Medical): No  Physical Activity: Sufficiently Active (07/06/2021)   Exercise Vital Sign    Days of Exercise per Week: 5 days    Minutes of Exercise per Session: 90 min  Stress: No Stress Concern Present (07/06/2021)   [place]    Feeling of Stress : Not at all  Social Connections: Moderately Integrated (07/06/2021)   Social Connection and Isolation Panel [NHANES]    Frequency of Communication with Friends and Family: More than three times a week    Frequency of Social Gatherings with Friends and Family: Twice a week    Attends Religious Services: More than 4 times per year    Active Member of [employer] or Organizations: No    Attends Archivist Meetings: Never    Marital Status: Living with partner  Intimate Partner Violence: Not At Risk (07/06/2021)   Humiliation, Afraid, Rape, and Kick questionnaire    Fear of Current or Ex-Partner: No    Emotionally Abused: No     Physically Abused: No    Sexually Abused: No   Family History  Problem Relation Age of Onset   Colon cancer Mother    Hyperlipidemia Mother    Hypertension Mother    Heart disease Mother    Stroke Mother    Diabetes Mother    Colon cancer Father    Arthritis Father    Hyperlipidemia Father    Hypertension Father    Heart disease Father    Stroke Father    Colon cancer Maternal Grandmother    Atopy Neg Hx    Stomach cancer Neg Hx    Esophageal cancer Neg Hx    Pancreatic cancer Neg Hx    Inflammatory bowel disease Neg Hx    Liver disease Neg Hx    Rectal cancer Neg Hx    I have reviewed her medical, social, and family history in detail and updated the electronic medical record as necessary.    PHYSICAL EXAMINATION  BP 110/78   Pulse 80   Ht '5\' 1"'$  (1.549 m)   Wt 112 lb (50.8 kg)   SpO2 96%   BMI 21.16 kg/m  Wt Readings from Last 3 Encounters:  12/02/21 113 lb (51.3 kg)  12/01/21 112 lb (50.8 kg)  08/31/21 114 lb (51.7 kg)  GEN: NAD, appears stated age, doesn't appear chronically ill PSYCH: Cooperative, without pressured speech EYE: Conjunctivae pink, sclerae anicteric ENT: MMM CV: Nontachycardic RESP: No audible wheezing  GI: NABS, soft, NT/ND, without rebound or guarding MSK/EXT: No lower extremity edema SKIN: No jaundice NEURO:  Alert & Oriented x 3, no focal deficits   REVIEW OF DATA  I reviewed the following data at the time of this encounter:  GI Procedures and Studies  September 2023 colonoscopy 110 mm polyp in the proximal ascending colon, removed with a hot snare.  Resected and retrieved. 125 mm polyp at the ileocecal valve.  Unsuccessful polyp resection.  Tattooed.  Biopsied.  Diverticulosis in the entire examined colon. The examined portion of the ileum was normal.  Pathology Ascending colon polyp tubular adenoma Ileocecal valve biopsy tubular adenoma  Laboratory Studies  Reviewed those in epic  Imaging Studies  No imaging studies to  review   ASSESSMENT  Ms. Slaugh is a 76 y.o. female with a pmh significant for CAD, hypertension, hyperlipidemia, hypothyroidism, GERD, diverticulosis (  previous diverticulitis episodes), family history colon cancer (mother/father), colon polyps (TAs & recurrent ICV adenoma status post previous resection by [facility] and then [facility] and now with recurrence in 2023).  The patient is seen today for evaluation and management of:  1. Ileocecal valve adenoma   2. Hx of adenomatous colonic polyps   3. Abnormal colonoscopy   4. Family history of colon cancer    The patient is hemodynamically and clinically stable.  Based upon the description and endoscopic pictures I do feel that it is reasonable to pursue an Advanced Polypectomy attempt of the polyp/lesion.  We discussed some of the techniques of advanced polypectomy which include Endoscopic Mucosal Resection, OVESCO Full-Thickness Resection, Endorotor Morcellation, and Tissue Ablation via Fulguration.  We also reviewed images of typical techniques as noted above.  The risks and benefits of endoscopic evaluation were discussed with the patient; these include but are not limited to the risk of perforation, infection, bleeding, missed lesions, lack of diagnosis, severe illness requiring hospitalization, as well as anesthesia and sedation related illnesses.  During attempts at advanced resection, the risks of bleeding and perforation/leak are increased as opposed to diagnostic and screening procedures, and that was discussed with the patient as well.   In addition, I explained that with the possible need for piecemeal resection, subsequent short-interval endoscopic evaluation for follow up and potential retreatment of the lesion/area may be necessary.  I did offer, a referral to surgery in order for patient to have opportunity to discuss surgical management/intervention prior to finalizing decision for attempt at endoscopic removal, however, the patient deferred  on this.  If, after attempt at removal of the polyp/lesion, it is found that the patient has a complication or that an invasive lesion or malignant lesion is found, or that the polyp/lesion continues to recur, the patient is aware and understands that surgery may still be indicated/required.  All patient questions were answered, to the best of my ability, and the patient agrees to the aforementioned plan of action with follow-up as indicated.   PLAN  Preprocedure labs to be performed Zofran given to give patient less risk of nausea during her preparation Proceed with scheduling colonoscopy with EMR (Endorotor most likely to be utilized if not lifting) Follow-up to be dictated by results of colonoscopy though patient understands that as this has been worked on 3 previous occasions, she will have potential need for surgical intervention   Orders Placed This Encounter  Procedures   Procedural/ Surgical Case Request: COLONOSCOPY WITH PROPOFOL, ENDOSCOPIC MUCOSAL RESECTION   CBC   Basic Metabolic Panel (BMET)   INR/PT   Ambulatory referral to Gastroenterology    New Prescriptions   ONDANSETRON (ZOFRAN-ODT) 8 MG DISINTEGRATING TABLET    Dissolve 1 tablet under the tongue 30 mins prior to drinking preparation for colonoscopy.    Planned Follow Up No follow-ups on file.   Total Time in Face-to-Face and in Coordination of Care for patient including independent/personal interpretation/review of prior testing, medical history, examination, medication adjustment, communicating results with the patient directly, and documentation within the EHR is 45 minutes.   Justice Britain, MD [facility] Advanced [facility]oscopy Office # [phone]

## 2021-12-02 ENCOUNTER — Encounter: Payer: Self-pay | Admitting: Family Medicine

## 2021-12-02 ENCOUNTER — Ambulatory Visit (INDEPENDENT_AMBULATORY_CARE_PROVIDER_SITE_OTHER): Payer: Medicare Other | Admitting: Family Medicine

## 2021-12-02 VITALS — BP 128/70 | HR 84 | Temp 97.9°F | Ht 61.0 in | Wt 113.0 lb

## 2021-12-02 DIAGNOSIS — Z860100 Personal history of colon polyps, unspecified: Secondary | ICD-10-CM

## 2021-12-02 DIAGNOSIS — R739 Hyperglycemia, unspecified: Secondary | ICD-10-CM

## 2021-12-02 DIAGNOSIS — E785 Hyperlipidemia, unspecified: Secondary | ICD-10-CM | POA: Diagnosis not present

## 2021-12-02 DIAGNOSIS — E039 Hypothyroidism, unspecified: Secondary | ICD-10-CM

## 2021-12-02 DIAGNOSIS — I10 Essential (primary) hypertension: Secondary | ICD-10-CM

## 2021-12-02 DIAGNOSIS — Z800 Family history of malignant neoplasm of digestive organs: Secondary | ICD-10-CM | POA: Diagnosis not present

## 2021-12-02 DIAGNOSIS — M797 Fibromyalgia: Secondary | ICD-10-CM | POA: Diagnosis not present

## 2021-12-02 DIAGNOSIS — R933 Abnormal findings on diagnostic imaging of other parts of digestive tract: Secondary | ICD-10-CM | POA: Diagnosis not present

## 2021-12-02 LAB — COMPREHENSIVE METABOLIC PANEL
ALT: 12 U/L (ref 0–35)
AST: 21 U/L (ref 0–37)
Albumin: 4 g/dL (ref 3.5–5.2)
Alkaline Phosphatase: 83 U/L (ref 39–117)
BUN: 16 mg/dL (ref 6–23)
CO2: 28 mEq/L (ref 19–32)
Calcium: 9.1 mg/dL (ref 8.4–10.5)
Chloride: 100 mEq/L (ref 96–112)
Creatinine, Ser: 0.76 mg/dL (ref 0.40–1.20)
GFR: 74.91 mL/min (ref >60.00)
Glucose, Bld: 91 mg/dL (ref 70–99)
Potassium: 4 mEq/L (ref 3.5–5.1)
Sodium: 136 mEq/L (ref 135–145)
Total Bilirubin: 0.4 mg/dL (ref 0.2–1.2)
Total Protein: 6.4 g/dL (ref 6.0–8.3)

## 2021-12-02 LAB — CBC WITH DIFFERENTIAL/PLATELET
Basophils Absolute: 0.1 10*3/uL (ref 0.0–0.1)
Basophils Relative: 0.9 % (ref 0.0–3.0)
Eosinophils Absolute: 0 10*3/uL (ref 0.0–0.7)
Eosinophils Relative: 0.7 % (ref 0.0–5.0)
HCT: 37.7 % (ref 36.0–46.0)
Hemoglobin: 12.7 g/dL (ref 12.0–15.0)
Lymphocytes Relative: 16.9 % (ref 12.0–46.0)
Lymphs Abs: 1.1 10*3/uL (ref 0.7–4.0)
MCHC: 33.7 g/dL (ref 30.0–36.0)
MCV: 95.2 fl (ref 78.0–100.0)
Monocytes Absolute: 0.4 10*3/uL (ref 0.1–1.0)
Monocytes Relative: 6.1 % (ref 3.0–12.0)
Neutro Abs: 5 10*3/uL (ref 1.4–7.7)
Neutrophils Relative %: 75.4 % (ref 43.0–77.0)
Platelets: 344 10*3/uL (ref 150.0–400.0)
RBC: 3.95 Mil/uL (ref 3.87–5.11)
RDW: 12.7 % (ref 11.5–15.5)
WBC: 6.6 10*3/uL (ref 4.0–10.5)

## 2021-12-02 LAB — PROTIME-INR
INR: 0.9 ratio (ref 0.8–1.0)
Prothrombin Time: 10.5 s (ref 9.6–13.1)

## 2021-12-02 LAB — TSH: TSH: 1.73 u[IU]/mL (ref 0.35–5.50)

## 2021-12-02 LAB — HEMOGLOBIN A1C: Hgb A1c MFr Bld: 6.6 % — ABNORMAL HIGH (ref 4.6–6.5)

## 2021-12-02 MED ORDER — ALPRAZOLAM 0.25 MG PO TABS: ORAL_TABLET | ORAL | 1 refills | Status: DC

## 2021-12-02 NOTE — Patient Instructions (Addendum)
Team see if PT/INR can be added from GI- all other labs I included with my labs- if not can reorder under my name  Please stop by lab before you go If you have mychart- we will send your results within 3 business days of [place] receiving them.  If you do not have mychart- we will call you about results within 5 business days of [place] receiving them.  *please also note that you will see labs on mychart as soon as they post. I will later go in and write notes on them- will say "notes from Dr. Yong Channel"   Recommended follow up: Return in about 6 months (around 06/02/2022) for physical or sooner if needed.Schedule b4 you leave.

## 2021-12-02 NOTE — Progress Notes (Signed)
Phone [phone] In person visit   Subjective:   Belinda Day is a 76 y.o. year old very pleasant female patient who presents for/with See problem oriented charting Chief Complaint  Patient presents with   Follow-up    Pt states she met with GI yesterday and has a recurring polyp, future labs already ordered for that provider.   Hypertension    Past Medical History-  Patient Active Problem List   Diagnosis Date Noted   Osteopenia 08/14/2011    Priority: High   CAD (coronary artery disease) s/p CABG 07/03/2007    Priority: High   Aortic atherosclerosis ([place]) 05/28/2020    Priority: Medium    Insulin resistance 09/07/2017    Priority: Medium    Hyperglycemia 08/05/2014    Priority: Medium    Insomnia 02/12/2013    Priority: Medium    Nocturnal hypoxemia 12/12/2012    Priority: Medium    Fibromyalgia 12/28/2011    Priority: Medium    Hypothyroidism 04/06/2010    Priority: Medium    MAI (mycobacterium avium-intracellulare) ([place]) 11/19/2009    Priority: Medium    Hyperlipemia 07/03/2007    Priority: Medium    Essential hypertension 07/03/2007    Priority: Medium    Obstructive bronchiectasis ([place]) with GOLD II/III criteria 07/03/2007    Priority: Medium    Senile purpura ([place]) 05/15/2019    Priority: Low   Cystitis 11/05/2016    Priority: Low   Former smoker 08/05/2014    Priority: Low   Constipation 05/02/2014    Priority: Low   History of colonic polyps 05/02/2014    Priority: Low   GERD (gastroesophageal reflux disease) 02/24/2014    Priority: Low   Hemoptysis 02/24/2014    Priority: Low   Benign paroxysmal positional vertigo 04/18/2013    Priority: Low   Diverticulitis 12/21/2012    Priority: Low   Cerebrovascular disease 01/31/2015    Medications- reviewed and updated Current Outpatient Medications  Medication Sig Dispense Refill   amitriptyline (ELAVIL) 50 MG tablet Take 1 tablet (50 mg total) by mouth at bedtime. 90 tablet 3   aspirin EC 81  MG tablet Take 81 mg by mouth daily.     atorvastatin (LIPITOR) 80 MG tablet TAKE 1 TABLET(80 MG) BY MOUTH DAILY 90 tablet 1   azithromycin (ZITHROMAX) 250 MG tablet Take 1 tablet (250 mg total) by mouth daily. 30 tablet 5   Melatonin 5 MG TABS Take 5 mg by mouth at bedtime.     metFORMIN (GLUCOPHAGE) 500 MG tablet TAKE 1 TABLET(500 MG) BY MOUTH TWICE DAILY WITH A MEAL 180 tablet 3   mometasone-formoterol (DULERA) 100-5 MCG/ACT AERO Take 2 puffs first thing in am and then another 2 puffs about 12 hours later. 1 each 11   Multiple Vitamin (MULTIVITAMIN) tablet Take 1 tablet by mouth daily.     nebivolol (BYSTOLIC) 2.5 MG tablet TAKE 1 TABLET(2.5 MG) BY MOUTH DAILY 90 tablet 3   Respiratory Therapy Supplies (FLUTTER) DEVI Use as directed 1 each 0   SYNTHROID 75 MCG tablet TAKE 1 TABLET(75 MCG) BY MOUTH DAILY BEFORE AND BREAKFAST 90 tablet 3   telmisartan (MICARDIS) 80 MG tablet TAKE 1 TABLET(80 MG) BY MOUTH DAILY 90 tablet 3   VITAMIN D, CHOLECALCIFEROL, PO Take by mouth.     acetaminophen (TYLENOL) 650 MG CR tablet every 8 (eight) hours as needed for pain.      albuterol (PROAIR HFA) 108 (90 Base) MCG/ACT inhaler Inhale 2 puffs into the lungs  every 6 (six) hours as needed for wheezing or shortness of breath. 1 each 2   ALPRAZolam (XANAX) 0.25 MG tablet TAKE 1 TABLET BY MOUTH AT BEDTIME AS NEEDED FOR SLEEP. May take very sparingly for daytime anxiety at least 8 hours from nighttime dose. Do not drive for 8 hours after taking 95 tablet 1   Dextromethorphan-Guaifenesin (MUCINEX DM) 30-600 MG TB12 Take 1 tablet by mouth daily. Patient takes 1228m (Patient not taking: Reported on 12/02/2021)     ondansetron (ZOFRAN-ODT) 8 MG disintegrating tablet Dissolve 1 tablet under the tongue 30 mins prior to drinking preparation for colonoscopy. (Patient not taking: Reported on 12/02/2021) 2 tablet 0   No current facility-administered medications for this visit.     Objective:  BP 130/80   Pulse 84   Temp 97.9  F (36.6 C)   Ht _0  (1.549 m)   Wt 113 lb (51.3 kg)   SpO2 95%   BMI 21.35 kg/m  Gen: NAD, resting comfortably CV: RRR no murmurs rubs or gallops Lungs: diffuse wheeze- no crackles Ext: no edema Skin: warm, dry     Assessment and Plan   #Fibromyalgia/insomnia S: Compliant with amitriptyline 50 mg (had worsening symptoms on lower dose in past by prior PCP)-due to age, her pharmacist has mentioned coming off amitriptyline but she has been on since 1988 and prefers to continue.  Uses Tylenol as needed. -Uses Xanax to help with sleep.  Very difficult to fall asleep if she does not take this.  Fibromyalgia symptoms worsen with poor sleep A/P: overall stable other than some mild aches and pains not related to physical activity- plans to continue current medications -with activity tylenol and hot shower helpful   % Pulmonary-MAI and obstructive bronchiectasis-follows with Dr. WMelvyn NovasS: Patient follows with pulmonary clinic.  On regular Dulera went down to 100 but may need to go back up to 200 twice daily- some more breathing issues.  Albuterol as needed-once every few weeks- but more than she used to on 200 twice daily  Uses azithromycin daily A/P: slightly worsened - plans ot reach out to DR. Wert about increasing dose back up- felt better on 200 twice daily- until discuss with him continue current meds  % #CAD status post CABG-follows with Dr. CJones BroomS: Compliant with atorvastatin 80 mg and aspirin 81 mg.  LDL goal under 70 - no chest pain. Slightly worsened shortness of breath but seems to be pulmonary related. Occasional myalgias but tolerable- in her torso area- has mentioned to Dr. CStanford Breedshe reports and will discuss again next visit Lab Results  Component Value Date   CHOL 128 06/01/2021   HDL 71.70 06/01/2021   LDLCALC 43 06/01/2021   LDLDIRECT 51.0 11/28/2020   TRIG 67.0 06/01/2021   CHOLHDL 2 06/01/2021  A/P: CAD largely asymptomatic- continue current  meds Lipids at ideal goal <70 and even better under 55- continue current meds  -may try 100 mg coQ10   #Hypertension S: Compliant with Bystolic 2.5 mg, telmisartan 845mBP Readings from Last 3 Encounters:  12/02/21 130/80  12/01/21 110/78  08/31/21 126/68  A/P: Controlled. Continue current medications.    #Hypothyroidism S: Compliant with Synthroid 75 mcg Lab Results  Component Value Date   TSH 1.30 06/01/2021  A/P: hopefully stable- update tsh today. Continue current meds for now   #Hyperglycemia/prediabetes S: Compliant with metformin 500 mg twice a day. Lab Results  Component Value Date   HGBA1C 6.3 06/01/2021   HGBA1C 6.2 11/28/2020  HGBA1C 6.1 05/28/2020  A/P: hopefully stable- update a1c today. Continue current meds for now   #upcoming colonoscopy for polyp removal with Dr. Rush Landmark- needs some labs before visit- we are going to try to coordinate. May end up being surgical if cannot remove by colonoscopy. Reports needing 6-9 month follow up -prior Dr. Michail Sermon- may transfer. 4 day prep. [facility].   Recommended follow up: Return in about 6 months (around 06/02/2022) for physical or sooner if needed.Schedule b4 you leave. Future Appointments  Date Time Provider [place]  01/04/2022  9:45 AM [facility] CCM PHARMACIST [facility]  07/12/2022  1:45 PM [facility] HEALTH COACH [facility]  09/01/2022  9:30 AM Melvyn Novas, Christena Deem, MD [facility] None   Lab/Order associations:   ICD-10-CM   1. Hyperglycemia  R73.9 HgB A1c    2. Hypothyroidism, unspecified type  E03.9 TSH    3. Essential hypertension  I10 Comprehensive metabolic panel    CBC with Differential/Platelet    4. Hyperlipidemia, unspecified hyperlipidemia type  E78.5     5. Fibromyalgia  M79.7       Meds ordered this encounter  Medications   ALPRAZolam (XANAX) 0.25 MG tablet    Sig: TAKE 1 TABLET BY MOUTH AT BEDTIME AS NEEDED FOR SLEEP. May take very sparingly for daytime anxiety  at least 8 hours from nighttime dose. Do not drive for 8 hours after taking    Dispense:  95 tablet    Refill:  1    Return precautions advised.  Garret Reddish, MD

## 2021-12-05 ENCOUNTER — Encounter: Payer: Self-pay | Admitting: Gastroenterology

## 2021-12-05 DIAGNOSIS — Z86010 Personal history of colonic polyps: Secondary | ICD-10-CM | POA: Insufficient documentation

## 2021-12-05 DIAGNOSIS — Z800 Family history of malignant neoplasm of digestive organs: Secondary | ICD-10-CM | POA: Insufficient documentation

## 2021-12-05 DIAGNOSIS — R933 Abnormal findings on diagnostic imaging of other parts of digestive tract: Secondary | ICD-10-CM | POA: Insufficient documentation

## 2021-12-05 DIAGNOSIS — D120 Benign neoplasm of cecum: Secondary | ICD-10-CM | POA: Insufficient documentation

## 2021-12-14 DIAGNOSIS — S0502XA Injury of conjunctiva and corneal abrasion without foreign body, left eye, initial encounter: Secondary | ICD-10-CM | POA: Diagnosis not present

## 2021-12-14 DIAGNOSIS — H5712 Ocular pain, left eye: Secondary | ICD-10-CM | POA: Diagnosis not present

## 2021-12-28 NOTE — Progress Notes (Deleted)
Chronic Care Management Pharmacy Note  12/28/2021 Name:  Belinda Day MRN:  [id number] DOB:  26-May-1945  Summary: PharmD follow up.  Patient doing well, continues to take metformin.  Discussed dietary mods to prevent further increase from A1c.  Recommendations/Changes made from today's visit: None, continue routine A1c monitoring  Plan: FU 6 months   Subjective: Belinda Day is an 76 y.o. year old female who is a primary patient of Hunter, Brayton Mars, MD.  The CCM team was consulted for assistance with disease management and care coordination needs.    Engaged with patient by telephone for follow up visit in response to provider referral for pharmacy case management and/or care coordination services.   Consent to Services:  The patient was given the following information about Chronic Care Management services today, agreed to services, and gave verbal consent: 1. CCM service includes personalized support from designated clinical staff supervised by the primary care provider, including individualized plan of care and coordination with other care providers 2. 24/7 contact phone numbers for assistance for urgent and routine care needs. 3. Service will only be billed when office clinical staff spend 20 minutes or more in a month to coordinate care. 4. Only one practitioner may furnish and bill the service in a calendar month. 5.The patient may stop CCM services at any time (effective at the end of the month) by phone call to the office staff. 6. The patient will be responsible for cost sharing (co-pay) of up to 20% of the service fee (after annual deductible is met). Patient agreed to services and consent obtained.  Patient Care Team: Marin Olp, MD as PCP - General (Family Medicine) Stanford Breed Denice Bors, MD as PCP - Cardiology (Cardiology) Opthamology, [facility] as Consulting Physician (Ophthalmology) Tanda Rockers, MD as Consulting Physician (Pulmonary Disease) Roseanne Kaufman, MD as Consulting Physician (Orthopedic Surgery) Martinique, Amy, MD as Consulting Physician (Dermatology) Edythe Clarity, [facility] (Pharmacist)  Recent office visits:  05/28/21 Yong Channel) - no medication changes, A1c increased slightly patient to watch her dietary intake and activity level.  05/28/20 Yong Channel) - general exam, no changes to medication at this time  Recent consult visits: 08/25/20 Melvyn Novas, Pulm) - no med changes, patient is cleared for surgery on hand  Hospital visits: None in previous 6 months   Objective:  Lab Results  Component Value Date   CREATININE 0.76 12/02/2021   BUN 16 12/02/2021   GFR 74.91 12/02/2021   GFRNONAA 56 (L) 11/27/2019   GFRAA 65 11/27/2019   NA 136 12/02/2021   K 4.0 12/02/2021   CALCIUM 9.1 12/02/2021   CO2 28 12/02/2021   GLUCOSE 91 12/02/2021    Lab Results  Component Value Date/Time   HGBA1C 6.6 (H) 12/02/2021 09:42 AM   HGBA1C 6.3 06/01/2021 10:00 AM   GFR 74.91 12/02/2021 09:42 AM   GFR 65.72 06/01/2021 10:00 AM    Last diabetic Eye exam:  Lab Results  Component Value Date/Time   HMDIABEYEEXA No Retinopathy 10/14/2021 12:00 AM    Last diabetic Foot exam: No results found for: "HMDIABFOOTEX"   Lab Results  Component Value Date   CHOL 128 06/01/2021   HDL 71.70 06/01/2021   LDLCALC 43 06/01/2021   LDLDIRECT 51.0 11/28/2020   TRIG 67.0 06/01/2021   CHOLHDL 2 06/01/2021       Latest Ref Rng & Units 12/02/2021    9:42 AM 06/01/2021   10:00 AM 11/28/2020   11:21 AM  Hepatic Function  Total Protein  6.0 - 8.3 g/dL 6.4  7.0  6.9   Albumin 3.5 - 5.2 g/dL 4.0  4.2  4.2   AST 0 - 37 U/L _0 ALT 0 - 35 U/L _1 Alk Phosphatase 39 - 117 U/L 83  73  88   Total Bilirubin 0.2 - 1.2 mg/dL 0.4  0.4  0.4     Lab Results  Component Value Date/Time   TSH 1.73 12/02/2021 09:42 AM   TSH 1.30 06/01/2021 10:00 AM   FREET4 1.14 02/01/2012 10:03 AM   FREET4 0.94 08/02/2011 09:07 AM       Latest Ref Rng & Units  12/02/2021    9:42 AM 06/01/2021   10:00 AM 11/28/2020   11:21 AM  CBC  WBC 4.0 - 10.5 K/uL 6.6  5.6  6.8   Hemoglobin 12.0 - 15.0 g/dL 12.7  13.5  13.3   Hematocrit 36.0 - 46.0 % 37.7  40.6  39.9   Platelets 150.0 - 400.0 K/uL 344.0  254.0  276.0     Lab Results  Component Value Date/Time   VD25OH 50 02/01/2012 10:03 AM    Clinical ASCVD: No  The ASCVD Risk score (Arnett DK, et al., 2019) failed to calculate for the following reasons:   The valid total cholesterol range is 130 to 320 mg/dL       07/06/2021    2:21 PM 11/28/2020   10:48 AM 06/09/2020   11:56 AM  Depression screen PHQ 2/9  Decreased Interest 0 0 0  Down, Depressed, Hopeless 0 0 0  PHQ - 2 Score 0 0 0     Social History   Tobacco Use  Smoking Status Former   Packs/day: 1.00   Years: 20.00   Total pack years: 20.00   Types: Cigarettes   Quit date: 01/25/1970   Years since quitting: 51.9  Smokeless Tobacco Never   BP Readings from Last 3 Encounters:  12/02/21 128/70  12/01/21 110/78  08/31/21 126/68   Pulse Readings from Last 3 Encounters:  12/02/21 84  12/01/21 80  08/31/21 70   Wt Readings from Last 3 Encounters:  12/02/21 113 lb (51.3 kg)  12/01/21 112 lb (50.8 kg)  08/31/21 114 lb (51.7 kg)   BMI Readings from Last 3 Encounters:  12/02/21 21.35 kg/m  12/01/21 21.16 kg/m  08/31/21 21.54 kg/m    Assessment/Interventions: Review of patient past medical history, allergies, medications, health status, including review of consultants reports, laboratory and other test data, was performed as part of comprehensive evaluation and provision of chronic care management services.   SDOH:  (Social Determinants of Health) assessments and interventions performed: Yes  Financial Resource Strain: Low Risk  (07/06/2021)   Overall Financial Resource Strain (CARDIA)    Difficulty of Paying Living Expenses: Not hard at all    [place]: No Food Insecurity (07/06/2021)  Housing:  Low Risk  (07/06/2021)  Transportation Needs: No Transportation Needs (07/06/2021)  Depression (PHQ2-9): Low Risk  (07/06/2021)  Financial Resource Strain: Low Risk  (07/06/2021)  Physical Activity: Sufficiently Active (07/06/2021)  Social Connections: Moderately Integrated (07/06/2021)  Stress: No Stress Concern Present (07/06/2021)  Tobacco Use: Medium Risk (12/05/2021)    CCM Care Plan  Allergies  Allergen Reactions   Bactrim [Sulfamethoxazole-Trimethoprim] Hives, Itching and Other (See Comments)    Bruised like areas on body   Ciprofloxacin Other (See Comments)    Body aches  Codeine Nausea Only    REACTION: nausea   Erythromycin Nausea Only    REACTION: nausea   Levofloxacin Other (See Comments)    REACTION: aches    Medications Reviewed Today     Reviewed by Irving Copas., MD (Physician) on 12/05/21 at 0600  Med List Status: <None>   Medication Order Taking? Sig Documenting Provider Last Dose Status Informant  acetaminophen (TYLENOL) 650 MG CR tablet [id number] Yes every 8 (eight) hours as needed for pain.  [provider] Taking Active Self  albuterol (PROAIR HFA) 108 (90 Base) MCG/ACT inhaler [id number] Yes Inhale 2 puffs into the lungs every 6 (six) hours as needed for wheezing or shortness of breath. Tanda Rockers, MD Taking Active   ALPRAZolam Duanne Moron) 0.25 MG tablet [id number]  TAKE 1 TABLET BY MOUTH AT BEDTIME AS NEEDED FOR SLEEP. May take very sparingly for daytime anxiety at least 8 hours from nighttime dose. Do not drive for 8 hours after taking Marin Olp, MD  Active   amitriptyline (ELAVIL) 50 MG tablet [id number] Yes Take 1 tablet (50 mg total) by mouth at bedtime. Marin Olp, MD Taking Active   aspirin EC 81 MG tablet [id number] Yes Take 81 mg by mouth daily. [provider] Taking Active   atorvastatin (LIPITOR) 80 MG tablet [id number] Yes TAKE 1 TABLET(80 MG) BY MOUTH DAILY Crenshaw, Denice Bors, MD Taking Active   azithromycin  (ZITHROMAX) 250 MG tablet [id number] Yes Take 1 tablet (250 mg total) by mouth daily. Tanda Rockers, MD Taking Active   Dextromethorphan-Guaifenesin [facility] DM) 30-600 MG TB12 [id number] Yes Take 1 tablet by mouth daily. Patient takes 1238m  Patient not taking: Reported on 12/02/2021   [provider] Taking Active   Melatonin 5 MG TABS [id number] Take 5 mg by mouth at bedtime. [provider] Taking Active   metFORMIN (GLUCOPHAGE) 500 MG tablet [id number] TAKE 1 TABLET(500 MG) BY MOUTH TWICE DAILY WITH A MEAL HMarin Olp MD Taking Active   mometasone-formoterol ([facility] 100-5 MCG/ACT AHollie [id number] Take 2 puffs first thing in am and then another 2 puffs about 12 hours later. WTanda Rockers MD Taking Active   Multiple Vitamin (MULTIVITAMIN) tablet [id number] Take 1 tablet by mouth daily. [provider] Taking Active   nebivolol (BYSTOLIC) 2.5 MG tablet [id number] TAKE 1 TABLET(2.5 MG) BY MOUTH DAILY Crenshaw, BDenice Bors MD Taking Active   ondansetron (ZOFRAN-ODT) 8 MG disintegrating tablet [id number] Dissolve 1 tablet under the tongue 30 mins prior to drinking preparation for colonoscopy.  Patient not taking: Reported on 12/02/2021   Mansouraty, GTelford Nab, MD  Active   Respiratory Therapy Supplies (FLUTTER) DEVI [id number] Use as directed WTanda Rockers MD Taking Active Self  SYNTHROID 75 MCG tablet [id number] TAKE 1 TABLET(75 MCG) BY MOUTH DAILY BEFORE AND BREAKFAST HMarin Olp MD Taking Active   telmisartan (MICARDIS) 80 MG tablet [id number] TAKE 1 TABLET(80 MG) BY MOUTH DAILY CStanford BreedBDenice Bors MD Taking Active   VITAMIN D, CHOLECALCIFEROL, PO [id number] Take by mouth. 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   Abnormal colonoscopy 12/05/2021   Hx of adenomatous colonic polyps 12/05/2021   Family history of colon  cancer 12/05/2021   Aortic atherosclerosis ([place] 05/28/2020   Senile purpura ([place] 05/15/2019   Insulin  resistance 09/07/2017   Cystitis 11/05/2016   Cerebrovascular disease 01/31/2015   Former smoker 08/05/2014   Hyperglycemia 08/05/2014   Constipation 05/02/2014   History of colonic polyps 05/02/2014   GERD (gastroesophageal reflux disease) 02/24/2014   Hemoptysis 02/24/2014   Benign paroxysmal positional vertigo 04/18/2013   Insomnia 02/12/2013   Diverticulitis 12/21/2012   Nocturnal hypoxemia 12/12/2012   Fibromyalgia 12/28/2011   Osteopenia 08/14/2011   Hypothyroidism 04/06/2010   MAI (mycobacterium avium-intracellulare) ([place]) 11/19/2009   Hyperlipemia 07/03/2007   Essential hypertension 07/03/2007   CAD (coronary artery disease) s/p CABG 07/03/2007   Obstructive bronchiectasis ([place]) with GOLD II/III criteria 07/03/2007    Immunization History  Administered Date(s) Administered   Fluad Quad(high Dose 65+) 10/28/2020, 10/26/2021   H1N1 01/02/2008   Influenza Split 10/14/2010, 10/07/2011   Influenza, High Dose Seasonal PF 11/05/2016, 10/28/2020   Influenza,inj,Quad PF,6+ Mos 10/02/2012, 10/16/2015, 11/07/2017, 10/05/2018, 10/30/2019   Influenza-Unspecified 09/25/2013, 10/15/2014, 11/07/2017   Moderna Covid-19 Vaccine Bivalent Booster 41yr & up 11/14/2020   Moderna Sars-Covid-2 Vaccination 02/09/2019, 03/12/2019, 09/05/2019, 05/12/2020   PFIZER Comirnaty([place])Covid-19 Tri-Sucrose Vaccine 11/12/2021   PNEUMOCOCCAL CONJUGATE-20 08/27/2020   Pneumococcal Conjugate-13 08/14/2013   Pneumococcal Polysaccharide-23 10/29/2011   Respiratory Syncytial Virus Vaccine,Recomb Aduvanted(Arexvy) 10/27/2021   Td 05/15/2019   Zoster Recombinat (Shingrix) 06/25/2017, 09/30/2017    Conditions to be addressed/monitored:  Hypertension and Hyperlipidemia  There are no care plans that you recently modified to display for this patient.      Medication Assistance: None required.   Patient affirms current coverage meets needs.  Compliance/Adherence/Medication fill history: Care Gaps: MAD - patient notified that Metformin rx is at pharmacy ready to p/u  Star-Rating Drugs: Metformin 5071m06/11/22 90ds Atorvastatin 2067m7/27/22 39ds  [facility] [address] [place]one: [phone]: [address]one: [phone]: [phone] pill box? No - Ziploc baggies for AM and PM Pt endorses 100% compliance  We discussed: Benefits of medication synchronization, packaging and delivery as well as enhanced pharmacist oversight with [employer]. Patient decided to: Continue current medication management strategy  Care Plan and Follow Up Patient Decision:  Patient agrees to Care Plan and Follow-up.  Plan: The care management team will reach out to the patient again over the next 180 days.  ChrBeverly MilchharmD Clinical Pharmacist ([phone] Current Barriers:  No home BP monitoring Med adherence DM - metformin  Pharmacist Clinical Goal(s):  Patient will achieve improvement in adherence as [facility] with PharmD and provider.   Interventions: 1:1 collaboration with HunMarin OlpD regarding development and update of comprehensive plan of care as evidenced by provider attestation and co-signature Inter-disciplinary care team collaboration (see longitudinal plan of care) Comprehensive medication review performed; medication list updated in electronic medical record  Hypertension (BP goal <140/90) -Controlled -Current treatment: Telmisartan 71m27mily Appropriate, Effective, Safe, Accessible Nebivolol 2.5mg 6mropriate, Effective, Safe,  Accessible -Medications previously tried: Valsartan, irbesartan, losartan  -Current home readings: not checking at home  -Denies hypotensive/hypertensive symptoms -Educated on BP goals and benefits of medications for prevention of heart attack, stroke and kidney damage; Exercise goal of 150 minutes per week; Importance of home blood pressure monitoring; Symptoms of hypotension and importance of maintaining adequate hydration; -  Counseled to monitor BP at home periodically, document, and provide log at future appointments -Recommended to continue current medication  Update 06/30/21 110-117/68-72 BP has been normal at home, tolerating medications well. Overall feels well. BP also controlled at recent OV's. No changes needed to BP meds, continue to monitor at home.   Hyperlipidemia: (LDL goal < 100) -Controlled -Current treatment: Atorvastatin 70m daily -Medications previously tried: none noted  -Most recent LDL is excellent -Educated on Cholesterol goals;  Benefits of statin for ASCVD risk reduction; Importance of limiting foods high in cholesterol; -Recommended to continue current medication  Pre-Diabetes (A1c goal <6.5%) -Controlled -Current medications: Metformin 5092mtwice daily Appropriate, Effective, Safe, Accessible -Medications previously tried: none noted  -Current home glucose readings fasting glucose: not checking post prandial glucose: not checking -Denies hypoglycemic/hyperglycemic symptoms -Educated on A1c increased slightly at last labs, she knows she is going to watch her dietary intake.  Continue adherence with metformin. -Counseled to check feet daily and get yearly eye exams -Recommended to continue current medication No changes needed at this time, continue routine A1c screenings for monitoring purposes since she is not checking at home.   Patient Goals/Self-Care Activities Patient will:  - continue to work on dietary modifications to limit carbs and  excess sugars/sweets.  Follow Up Plan: The care management team will reach out to the patient again over the next 180 days.

## 2021-12-31 IMAGING — DX DG CHEST 2V
2 series · 2 of 2 positions shown · non-contrast
Comparison: 02/16/2019

CLINICAL DATA: Obstructive bronchiectasis

EXAM:
CHEST - 2 VIEW

[chest pa]
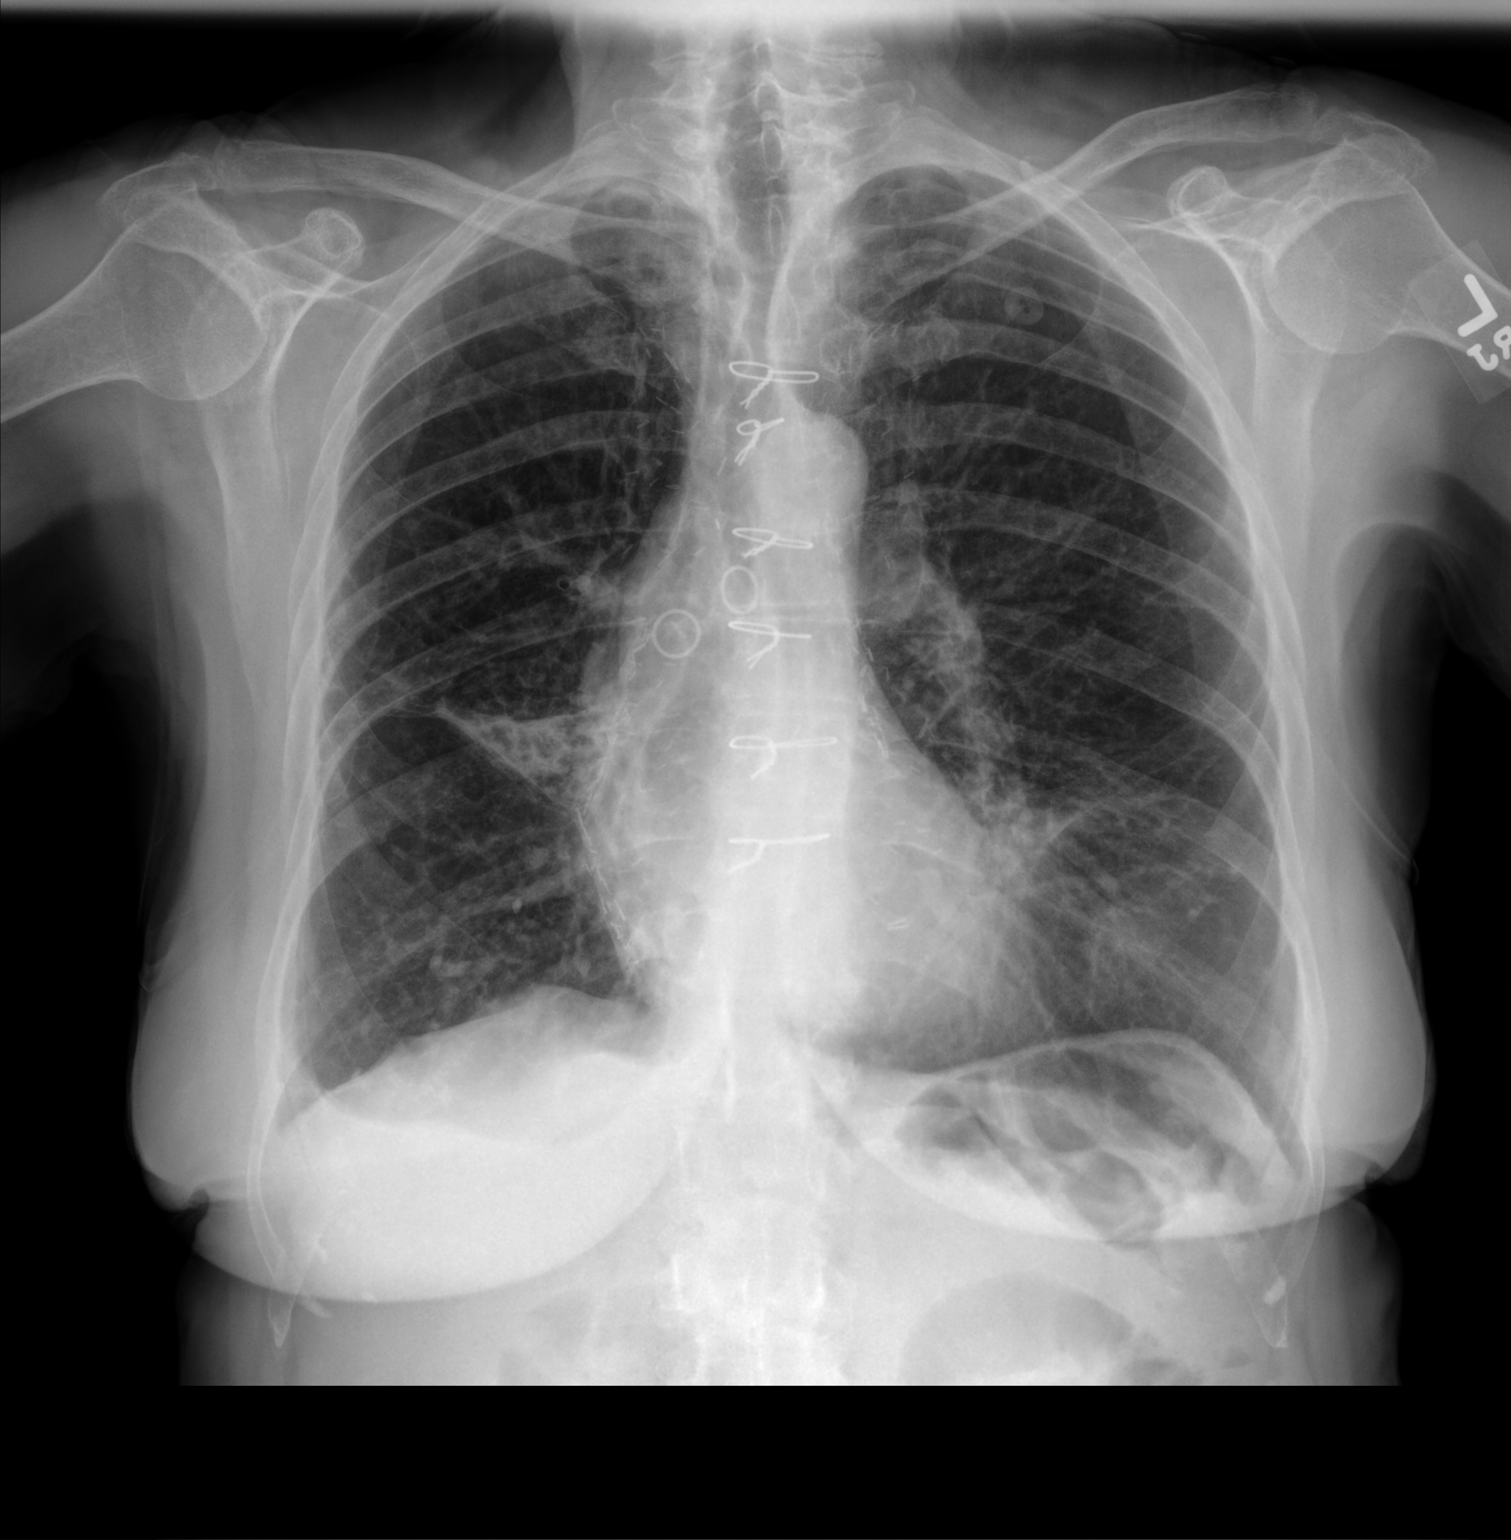

[chest lat]
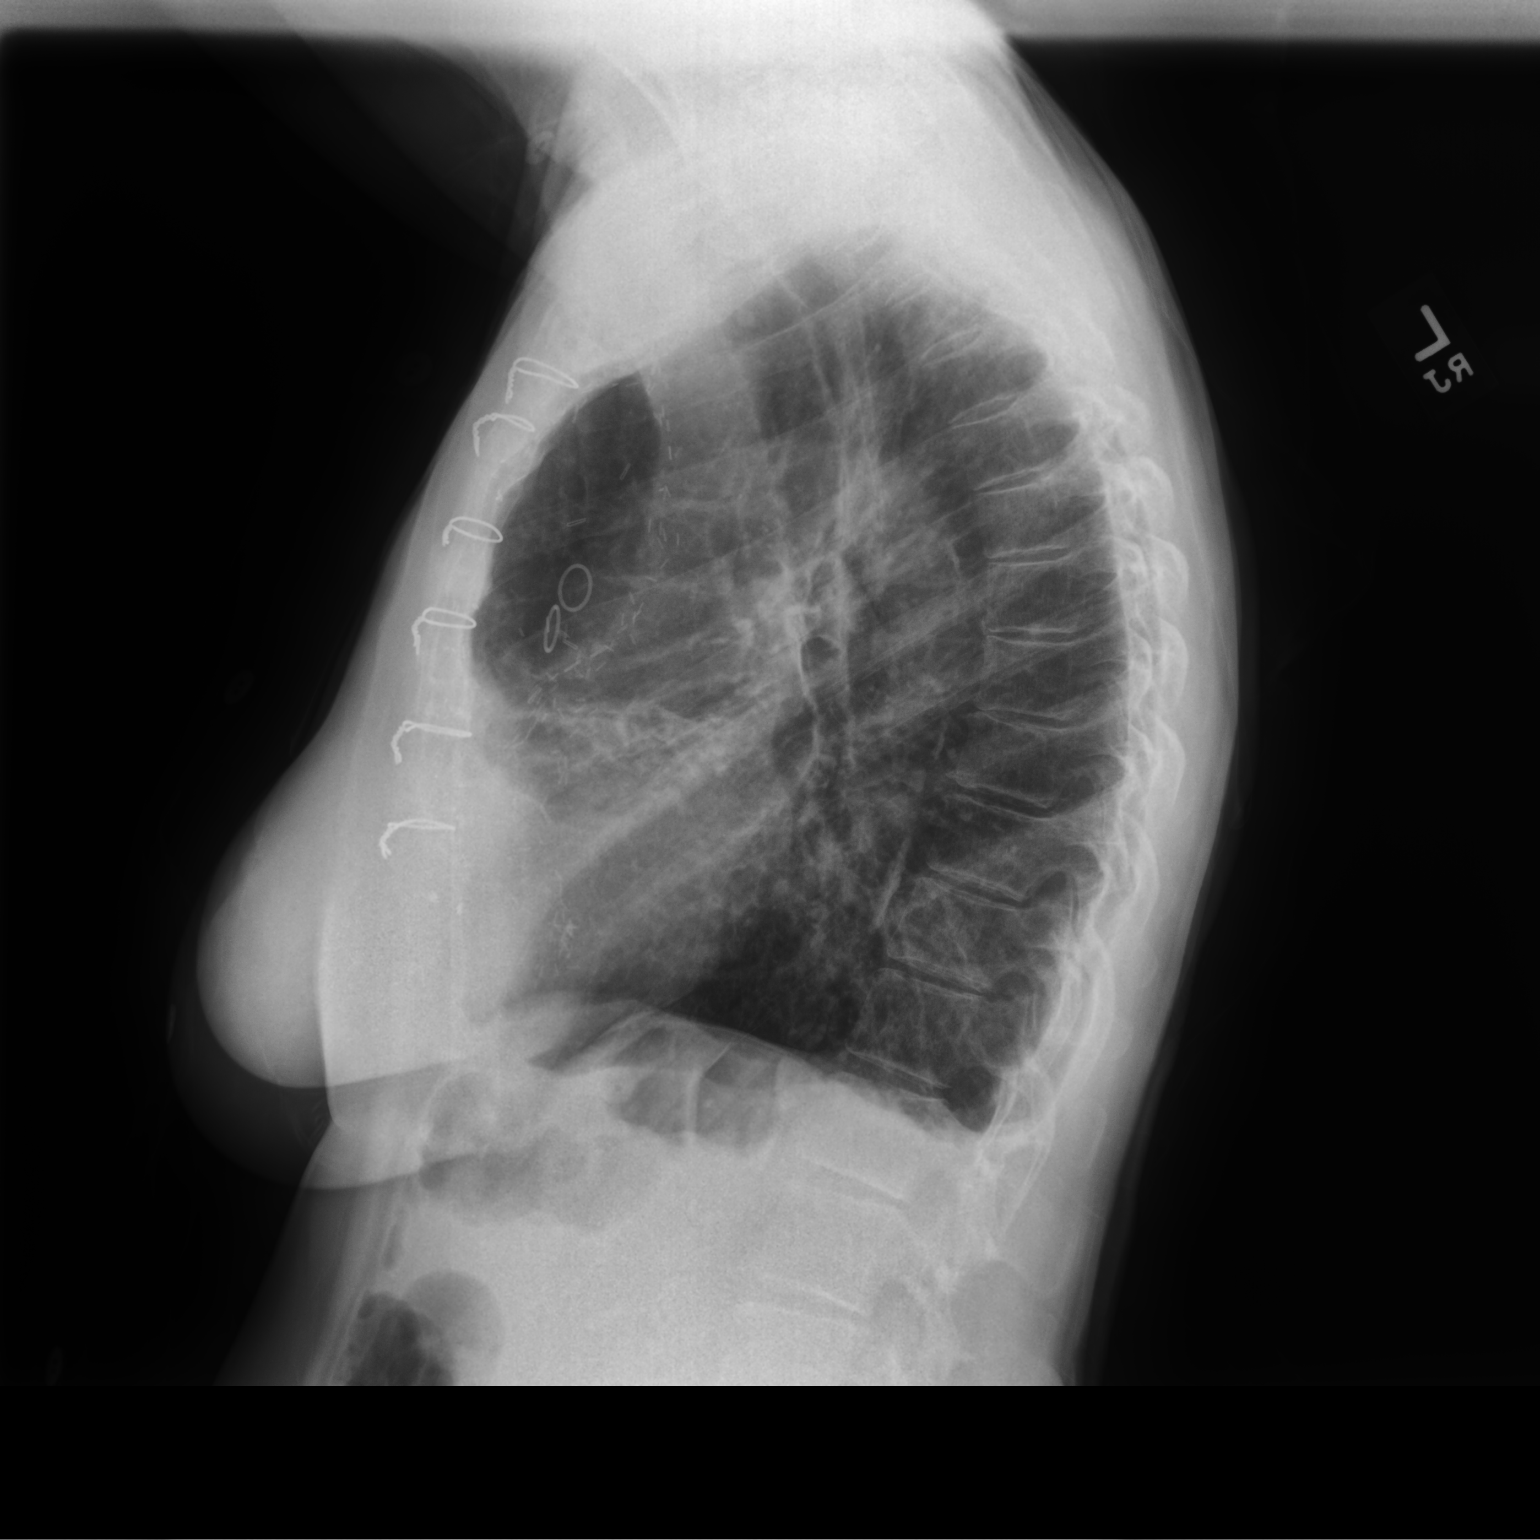

[2 of 2 positions shown; findings below may reference images not displayed]

FINDINGS: Prior CABG. Areas of bronchiectasis and scarring in the right upper
lobe, right middle lobe and left lung base. No acute confluent
opacities or effusions. No acute bony abnormality. Heart is normal
size.
IMPRESSION: Chronic changes of bronchiectasis and scarring.  No active disease.

## 2022-01-04 ENCOUNTER — Telehealth: Payer: Medicare Other

## 2022-01-11 ENCOUNTER — Ambulatory Visit: Payer: Medicare Other | Admitting: Pharmacist

## 2022-01-11 DIAGNOSIS — R739 Hyperglycemia, unspecified: Secondary | ICD-10-CM

## 2022-01-11 DIAGNOSIS — J479 Bronchiectasis, uncomplicated: Secondary | ICD-10-CM

## 2022-01-11 NOTE — Patient Instructions (Addendum)
Visit Information   Goals Addressed             This Visit's Progress    Track and Manage My Blood Pressure-Hypertension   On track    Timeframe:  Long-Range Goal Priority:  High Start Date:    09/30/20                         Expected End Date:  03/30/21                     Follow Up Date 12/30/20    - check blood pressure weekly - choose a place to take my blood pressure (home, clinic or office, retail store) - write blood pressure results in a log or diary    Why is this important?   You won't feel high blood pressure, but it can still hurt your blood vessels.  High blood pressure can cause heart or kidney problems. It can also cause a stroke.  Making lifestyle changes like losing a little weight or eating less salt will help.  Checking your blood pressure at home and at different times of the day can help to control blood pressure.  If the doctor prescribes medicine remember to take it the way the doctor ordered.  Call the office if you cannot afford the medicine or if there are questions about it.     Notes:        Patient Care Plan: General Pharmacy (Adult)     Problem Identified: HTN, HLD   Priority: High  Onset Date: 09/30/2020     Long-Range Goal: Patient-Specific Goal   Start Date: 09/30/2020  Expected End Date: 03/30/2021  Recent Progress: On track  Priority: High  Note:   Current Barriers:  Increasing A1c  Pharmacist Clinical Goal(s):  Patient will achieve improvement in adherence as evidenced by [facility] through collaboration with PharmD and provider.   Interventions: 1:1 collaboration with Marin Olp, MD regarding development and update of comprehensive plan of care as evidenced by provider attestation and co-signature Inter-disciplinary care team collaboration (see longitudinal plan of care) Comprehensive medication review performed; medication list updated in electronic medical record  Hypertension (BP goal <140/90) -Controlled, not  assessed -Current treatment: Telmisartan '80mg'$  daily Appropriate, Effective, Safe, Accessible Nebivolol 2.'5mg'$  Appropriate, Effective, Safe, Accessible -Medications previously tried: Valsartan, irbesartan, losartan  -Current home readings: not checking at home  -Denies hypotensive/hypertensive symptoms -Educated on BP goals and benefits of medications for prevention of heart attack, stroke and kidney damage; Exercise goal of 150 minutes per week; Importance of home blood pressure monitoring; Symptoms of hypotension and importance of maintaining adequate hydration; -Counseled to monitor BP at home periodically, document, and provide log at future appointments -Recommended to continue current medication  Update 06/30/21 110-117/68-72 BP has been normal at home, tolerating medications well. Overall feels well. BP also controlled at recent OV's. No changes needed to BP meds, continue to monitor at home.   Bronchiectasis (Goal: Reduce symptoms) 01/11/22 -Controlled -Current treatment  Dulera 12mg BID Appropriate, Effective, Safe, Accessible Albuterol HFA 90 mcg prn Appropriate, Effective, Safe, Accessible Azithromycin '250mg'$  daily Appropriate, Effective, Safe, Accessible -Medications previously tried: Symbicort -Patient reports that Dr. WMelvyn Novastried her on the 1026m dose with the thought of reducing steroid component and airway irritation.  She tried this for a month but noticed she was using rescue more so she switched back to the 20034m   Today we explained the reasons for this  lpwer dose and she is aware. Discussed option of using higher dose in her worse months (warm weather) and using the lower dose during the colder months when she does not have as many symptoms.  Patient agreeable to try this plan. -Will pass along to Dr. Melvyn Novas to keep him updated. -Be sure to continue to rinse mouth after use. -Patient to call if she needs refill or new prescription - can coordinate with Dr. Melvyn Novas  if need be. -Will FU in 30 days on symptoms.  Hyperlipidemia: (LDL goal < 55)   -Controlled, LDL at goal - not discussed today -Current treatment: Atorvastatin '80mg'$  daily Appropriate, Effective, Safe, Accessible -Medications previously tried: none noted  -Most recent LDL is excellent -Educated on Cholesterol goals;  Benefits of statin for ASCVD risk reduction; Importance of limiting foods high in cholesterol; -Recommended to continue current medication  Diabetes (A1c goal <7%) 01/11/22 -Controlled, A1c trending upward for the past year -Current medications: Metformin '500mg'$  twice daily Appropriate, Query Effective -Medications previously tried: none noted  -Current home glucose readings fasting glucose: not checking post prandial glucose: not checking - Diet recall: B: ensure/Boost/Glucerna, coffee w/ coffee mat creamer (low carb and sugar) L: sandwich, chips, banana/PB D:Lean cuisine with ravioli and veggies Snacks: cereal, banana, cheese crackers, occasional cookies Beverages: diet green tea, water -A1c continues to trend up.  She reports adherence to metformin and fill history shows 100% adherence. -Today we discussed limiting meals to 30-60 grams of carbs and snacks to 15g.  She was going to try to increase her exercise by walking around the house or getting an exercise bike down to start working on this. She has a FU visit in February where we can monitor A1c progress. Agreed to two goals today: Carbohydrate monitoring and sticking to 30-60g per meal and 15g per snack. Increasing physical activity (walking or exercise bike)  Will Fu on progress in 30 days.  Patient Goals/Self-Care Activities Patient will:  - continue to work on dietary modifications to limit carbs and excess sugars/sweets.  Follow Up Plan: The care management team will reach out to the patient again over the next 30days.            The patient verbalized understanding of instructions, educational  materials, and care plan provided today and DECLINED offer to receive copy of patient instructions, educational materials, and care plan.  Telephone follow up appointment with pharmacy team member scheduled for: 6 months  Edythe Clarity, [place], PharmD Clinical Pharmacist  [facility] [phone]

## 2022-01-11 NOTE — Progress Notes (Signed)
Chronic Care Management Pharmacy Note  01/11/2022 Name:  Belinda Day MRN:  [id number] DOB:  1945/05/22  Summary: PharmD follow up.  Discussed her trends in A1c.  Detailed discussion on dietary mods (carb tracking at meals and snacks) as it seems like carbohydrates are main source of her elevated glucose.  She knows she is due for DEXA and plans to discuss at Feb follow up.  Discussion about bronchiectasis and using lower dose of Dulera during her better months to reduce infection risk as discussed with Dr. Melvyn Novas.  Recommendations/Changes made from today's visit: 30-60g of carbs per meal, 15 g per snack Increase physical activity with walking or exercise bike We also need to add Diabetes to her problem list  Plan: FU 30 days to discuss progress on carbs/exercise Recheck A1c at Feb CPE   Subjective: Belinda Day is an 76 y.o. year old female who is a primary patient of Hunter, Brayton Mars, MD.  The CCM team was consulted for assistance with disease management and care coordination needs.    Engaged with patient by telephone for follow up visit in response to provider referral for pharmacy case management and/or care coordination services.   Consent to Services:  The patient was given the following information about Chronic Care Management services today, agreed to services, and gave verbal consent: 1. CCM service includes personalized support from designated clinical staff supervised by the primary care provider, including individualized plan of care and coordination with other care providers 2. 24/7 contact phone numbers for assistance for urgent and routine care needs. 3. Service will only be billed when office clinical staff spend 20 minutes or more in a month to coordinate care. 4. Only one practitioner may furnish and bill the service in a calendar month. 5.The patient may stop CCM services at any time (effective at the end of the month) by phone call to the office staff. 6. The  patient will be responsible for cost sharing (co-pay) of up to 20% of the service fee (after annual deductible is met). Patient agreed to services and consent obtained.  Patient Care Team: Marin Olp, MD as PCP - General (Family Medicine) Stanford Breed Denice Bors, MD as PCP - Cardiology (Cardiology) Opthamology, [facility] as Consulting Physician (Ophthalmology) Tanda Rockers, MD as Consulting Physician (Pulmonary Disease) Roseanne Kaufman, MD as Consulting Physician (Orthopedic Surgery) Martinique, Amy, MD as Consulting Physician (Dermatology) Edythe Clarity, [facility] (Pharmacist)  Recent office visits:  05/28/21 Yong Channel) - no medication changes, A1c increased slightly patient to watch her dietary intake and activity level.  05/28/20 Yong Channel) - general exam, no changes to medication at this time  Recent consult visits: 08/25/20 Melvyn Novas, Pulm) - no med changes, patient is cleared for surgery on hand  Hospital visits: None in previous 6 months   Objective:  Lab Results  Component Value Date   CREATININE 0.76 12/02/2021   BUN 16 12/02/2021   GFR 74.91 12/02/2021   GFRNONAA 56 (L) 11/27/2019   GFRAA 65 11/27/2019   NA 136 12/02/2021   K 4.0 12/02/2021   CALCIUM 9.1 12/02/2021   CO2 28 12/02/2021   GLUCOSE 91 12/02/2021    Lab Results  Component Value Date/Time   HGBA1C 6.6 (H) 12/02/2021 09:42 AM   HGBA1C 6.3 06/01/2021 10:00 AM   GFR 74.91 12/02/2021 09:42 AM   GFR 65.72 06/01/2021 10:00 AM    Last diabetic Eye exam:  Lab Results  Component Value Date/Time   HMDIABEYEEXA No Retinopathy 10/14/2021 12:00 AM  Last diabetic Foot exam: No results found for: "HMDIABFOOTEX"   Lab Results  Component Value Date   CHOL 128 06/01/2021   HDL 71.70 06/01/2021   LDLCALC 43 06/01/2021   LDLDIRECT 51.0 11/28/2020   TRIG 67.0 06/01/2021   CHOLHDL 2 06/01/2021       Latest Ref Rng & Units 12/02/2021    9:42 AM 06/01/2021   10:00 AM 11/28/2020   11:21 AM  Hepatic Function   Total Protein 6.0 - 8.3 g/dL 6.4  7.0  6.9   Albumin 3.5 - 5.2 g/dL 4.0  4.2  4.2   AST 0 - 37 U/L _0 ALT 0 - 35 U/L _1 Alk Phosphatase 39 - 117 U/L 83  73  88   Total Bilirubin 0.2 - 1.2 mg/dL 0.4  0.4  0.4     Lab Results  Component Value Date/Time   TSH 1.73 12/02/2021 09:42 AM   TSH 1.30 06/01/2021 10:00 AM   FREET4 1.14 02/01/2012 10:03 AM   FREET4 0.94 08/02/2011 09:07 AM       Latest Ref Rng & Units 12/02/2021    9:42 AM 06/01/2021   10:00 AM 11/28/2020   11:21 AM  CBC  WBC 4.0 - 10.5 K/uL 6.6  5.6  6.8   Hemoglobin 12.0 - 15.0 g/dL 12.7  13.5  13.3   Hematocrit 36.0 - 46.0 % 37.7  40.6  39.9   Platelets 150.0 - 400.0 K/uL 344.0  254.0  276.0     Lab Results  Component Value Date/Time   VD25OH 50 02/01/2012 10:03 AM    Clinical ASCVD: No  The ASCVD Risk score (Arnett DK, et al., 2019) failed to calculate for the following reasons:   The valid total cholesterol range is 130 to 320 mg/dL       07/06/2021    2:21 PM 11/28/2020   10:48 AM 06/09/2020   11:56 AM  Depression screen PHQ 2/9  Decreased Interest 0 0 0  Down, Depressed, Hopeless 0 0 0  PHQ - 2 Score 0 0 0     Social History   Tobacco Use  Smoking Status Former   Packs/day: 1.00   Years: 20.00   Total pack years: 20.00   Types: Cigarettes   Quit date: 01/25/1970   Years since quitting: 51.9  Smokeless Tobacco Never   BP Readings from Last 3 Encounters:  12/02/21 128/70  12/01/21 110/78  08/31/21 126/68   Pulse Readings from Last 3 Encounters:  12/02/21 84  12/01/21 80  08/31/21 70   Wt Readings from Last 3 Encounters:  12/02/21 113 lb (51.3 kg)  12/01/21 112 lb (50.8 kg)  08/31/21 114 lb (51.7 kg)   BMI Readings from Last 3 Encounters:  12/02/21 21.35 kg/m  12/01/21 21.16 kg/m  08/31/21 21.54 kg/m    Assessment/Interventions: Review of patient past medical history, allergies, medications, health status, including review of consultants reports, laboratory and  other test data, was performed as part of comprehensive evaluation and provision of chronic care management services.   SDOH:  (Social Determinants of Health) assessments and interventions performed: No, done within the year  Financial Resource Strain: Low Risk  (07/06/2021)   Overall Financial Resource Strain (CARDIA)    Difficulty of Paying Living Expenses: Not hard at all    [place]: No Food Insecurity (07/06/2021)  Housing: Low Risk  (07/06/2021)  Transportation Needs: No Transportation  Needs (07/06/2021)  Depression (PHQ2-9): Low Risk  (07/06/2021)  Financial Resource Strain: Low Risk  (07/06/2021)  Physical Activity: Sufficiently Active (07/06/2021)  Social Connections: Moderately Integrated (07/06/2021)  Stress: No Stress Concern Present (07/06/2021)  Tobacco Use: Medium Risk (12/05/2021)    CCM Care Plan  Allergies  Allergen Reactions   Bactrim [Sulfamethoxazole-Trimethoprim] Hives, Itching and Other (See Comments)    Bruised like areas on body   Ciprofloxacin Other (See Comments)    Body aches   Codeine Nausea Only    REACTION: nausea   Erythromycin Nausea Only    REACTION: nausea   Levofloxacin Other (See Comments)    REACTION: aches    Medications Reviewed Today     Reviewed by Edythe Clarity, [facility] (Pharmacist) on 01/11/22 at 1157  Med List Status: <None>   Medication Order Taking? Sig Documenting Provider Last Dose Status Informant  acetaminophen (TYLENOL) 650 MG CR tablet [id number] Yes every 8 (eight) hours as needed for pain.  [provider] Taking Active Self  albuterol (PROAIR HFA) 108 (90 Base) MCG/ACT inhaler [id number] Yes Inhale 2 puffs into the lungs every 6 (six) hours as needed for wheezing or shortness of breath. Tanda Rockers, MD Taking Active   ALPRAZolam Duanne Moron) 0.25 MG tablet [id number] Yes TAKE 1 TABLET BY MOUTH AT BEDTIME AS NEEDED FOR SLEEP. May take very sparingly for daytime anxiety at least 8 hours from  nighttime dose. Do not drive for 8 hours after taking Marin Olp, MD Taking Active   amitriptyline (ELAVIL) 50 MG tablet [id number] Yes Take 1 tablet (50 mg total) by mouth at bedtime. Marin Olp, MD Taking Active   aspirin EC 81 MG tablet [id number] Yes Take 81 mg by mouth daily. [provider] Taking Active   atorvastatin (LIPITOR) 80 MG tablet [id number] Yes TAKE 1 TABLET(80 MG) BY MOUTH DAILY Crenshaw, Denice Bors, MD Taking Active   azithromycin (ZITHROMAX) 250 MG tablet [id number] Yes Take 1 tablet (250 mg total) by mouth daily. Tanda Rockers, MD Taking Active   Dextromethorphan-Guaifenesin [facility] DM) 30-600 MG TB12 [id number] Yes Take 1 tablet by mouth daily. Patient takes 1271m [provider] Taking Active   Melatonin 5 MG TABS [id number] Take 5 mg by mouth at bedtime. [provider] Taking Active   metFORMIN (GLUCOPHAGE) 500 MG tablet [id number] TAKE 1 TABLET(500 MG) BY MOUTH TWICE DAILY WITH A MEAL HMarin Olp MD Taking Active   mometasone-formoterol ([facility] 100-5 MCG/ACT AHollie [id number] Take 2 puffs first thing in am and then another 2 puffs about 12 hours later. WTanda Rockers MD Taking Active   Multiple Vitamin (MULTIVITAMIN) tablet [id number] Take 1 tablet by mouth daily. [provider] Taking Active   nebivolol (BYSTOLIC) 2.5 MG tablet [id number] TAKE 1 TABLET(2.5 MG) BY MOUTH DAILY Crenshaw, BDenice Bors MD Taking Active   ondansetron (ZOFRAN-ODT) 8 MG disintegrating tablet [id number] Dissolve 1 tablet under the tongue 30 mins prior to drinking preparation for colonoscopy. Mansouraty, GTelford Nab, MD Taking Active   Respiratory Therapy Supplies (FLUTTER) DEVI [id number] Use as directed WTanda Rockers MD Taking Active Self  SYNTHROID 75 MCG tablet [id number] TAKE 1 TABLET(75 MCG) BY MOUTH DAILY BEFORE AND BREAKFAST HMarin Olp MD Taking Active   telmisartan (MICARDIS) 80 MG tablet [id number] TAKE  1 TABLET(80 MG) BY MOUTH DAILY CStanford BreedBDenice Bors MD Taking Active   VITAMIN D, CHOLECALCIFEROL, PO [id number] Take by mouth. 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   Abnormal colonoscopy 12/05/2021   Hx of adenomatous colonic polyps 12/05/2021   Family history of colon cancer 12/05/2021   Aortic atherosclerosis ([place]) 05/28/2020   Senile purpura ([place]) 05/15/2019   Insulin resistance 09/07/2017   Cystitis 11/05/2016   Cerebrovascular disease 01/31/2015   Former smoker 08/05/2014   Hyperglycemia 08/05/2014   Constipation 05/02/2014   History of colonic polyps 05/02/2014   GERD (gastroesophageal reflux disease) 02/24/2014   Hemoptysis 02/24/2014   Benign paroxysmal positional vertigo 04/18/2013   Insomnia 02/12/2013   Diverticulitis 12/21/2012   Nocturnal hypoxemia 12/12/2012   Fibromyalgia 12/28/2011   Osteopenia 08/14/2011   Hypothyroidism 04/06/2010   MAI (mycobacterium avium-intracellulare) ([place]) 11/19/2009   Hyperlipemia 07/03/2007   Essential hypertension 07/03/2007   CAD (coronary artery disease) s/p CABG 07/03/2007   Obstructive bronchiectasis ([place]) with GOLD II/III criteria 07/03/2007    Immunization History  Administered Date(s) Administered   Fluad Quad(high Dose 65+) 10/28/2020, 10/26/2021   H1N1 01/02/2008   Influenza Split 10/14/2010, 10/07/2011   Influenza, High Dose Seasonal PF 11/05/2016, 10/28/2020   Influenza,inj,Quad PF,6+ Mos 10/02/2012, 10/16/2015, 11/07/2017, 10/05/2018, 10/30/2019   Influenza-Unspecified 09/25/2013, 10/15/2014, 11/07/2017   Moderna Covid-19 Vaccine Bivalent Booster 53yr & up 11/14/2020   Moderna Sars-Covid-2 Vaccination 02/09/2019, 03/12/2019, 09/05/2019, 05/12/2020   PFIZER Comirnaty([place])Covid-19 Tri-Sucrose Vaccine 11/12/2021   PNEUMOCOCCAL CONJUGATE-20 08/27/2020   Pneumococcal Conjugate-13 08/14/2013   Pneumococcal  Polysaccharide-23 10/29/2011   Respiratory Syncytial Virus Vaccine,Recomb Aduvanted(Arexvy) 10/27/2021   Td 05/15/2019   Zoster Recombinat (Shingrix) 06/25/2017, 09/30/2017    Conditions to be addressed/monitored:  Hypertension and Hyperlipidemia  Care Plan : General Pharmacy (Adult)  Updates made by DEdythe Clarity RPH since 01/11/2022 12:00 AM     Problem: HTN, HLD   Priority: High  Onset Date: 09/30/2020     Long-Range Goal: Patient-Specific Goal   Start Date: 09/30/2020  Expected End Date: 03/30/2021  Recent Progress: On track  Priority: High  Note:   Current Barriers:  Increasing A1c  Pharmacist Clinical Goal(s):  Patient will achieve improvement in adherence as [facility] by [facility] with PharmD and provider.   Interventions: 1:1 collaboration with HMarin Olp MD regarding development and update of comprehensive plan of care as evidenced by provider attestation and co-signature Inter-disciplinary care team collaboration (see longitudinal plan of care) Comprehensive medication review performed; medication list updated in electronic medical record  Hypertension (BP goal <140/90) -Controlled, not assessed -Current treatment: Telmisartan 823mdaily Appropriate, Effective, Safe, Accessible Nebivolol 2.42m57mppropriate, Effective, Safe, Accessible -Medications previously tried: Valsartan, irbesartan, losartan  -Current home readings: not checking at home  -Denies hypotensive/hypertensive symptoms -Educated on BP goals and benefits of medications for prevention of heart attack, stroke and kidney damage; Exercise goal of 150 minutes per week; Importance of home blood pressure monitoring; Symptoms of hypotension and importance of maintaining adequate hydration; -Counseled to monitor BP at home periodically, document, and provide log at future appointments -Recommended to continue current medication  Update 06/30/21 110-117/68-72 BP has been normal  at home, tolerating medications well. Overall feels well. BP also controlled at recent OV's. No changes needed to BP meds, continue to monitor at home.   Bronchiectasis (Goal: Reduce symptoms) 01/11/22 -Controlled -Current treatment  Dulera 100m77mID Appropriate, Effective, Safe, Accessible Albuterol HFA 90 mcg prn Appropriate, Effective, Safe, Accessible Azithromycin 250mg57mly  Appropriate, Effective, Safe, Accessible -Medications previously tried: Symbicort -Patient reports that Dr. Melvyn Novas tried her on the 161mg dose with the thought of reducing steroid component and airway irritation.  She tried this for a month but noticed she was using rescue more so she switched back to the 2033m.   Today we explained the reasons for this lpwer dose and she is aware. Discussed option of using higher dose in her worse months (warm weather) and using the lower dose during the colder months when she does not have as many symptoms.  Patient agreeable to try this plan. -Will pass along to Dr. WeMelvyn Novaso keep him updated. -Be sure to continue to rinse mouth after use. -Patient to call if she needs refill or new prescription - can coordinate with Dr. WeMelvyn Novasf need be. -Will FU in 30 days on symptoms.  Hyperlipidemia: (LDL goal < 55)   -Controlled, LDL at goal - not discussed today -Current treatment: Atorvastatin 8088maily Appropriate, Effective, Safe, Accessible -Medications previously tried: none noted  -Most recent LDL is excellent -Educated on Cholesterol goals;  Benefits of statin for ASCVD risk reduction; Importance of limiting foods high in cholesterol; -Recommended to continue current medication  Diabetes (A1c goal <7%) 01/11/22 -Controlled, A1c trending upward for the past year -Current medications: Metformin 500m93mice daily Appropriate, Query Effective -Medications previously tried: none noted  -Current home glucose readings fasting glucose: not checking post prandial glucose: not  checking - Diet recall: B: ensure/Boost/Glucerna, coffee w/ coffee mat creamer (low carb and sugar) L: sandwich, chips, banana/PB D:Lean cuisine with ravioli and veggies Snacks: cereal, banana, cheese crackers, occasional cookies Beverages: diet green tea, water -A1c continues to trend up.  She reports adherence to metformin and fill history shows 100% adherence. -Today we discussed limiting meals to 30-60 grams of carbs and snacks to 15g.  She was going to try to increase her exercise by walking around the house or getting an exercise bike down to start working on this. She has a FU visit in February where we can monitor A1c progress. Agreed to two goals today: Carbohydrate monitoring and sticking to 30-60g per meal and 15g per snack. Increasing physical activity (walking or exercise bike)  Will Fu on progress in 30 days.  Patient Goals/Self-Care Activities Patient will:  - continue to work on dietary modifications to limit carbs and excess sugars/sweets.  Follow Up Plan: The care management team will reach out to the patient again over the next 30days.              Medication Assistance: None required.  Patient affirms current coverage meets needs.  Compliance/Adherence/Medication fill history: Care Gaps: DEXA - patient plans to schedule at upcoming Feb CPE  Star-Rating Drugs: Metformin 500mg84m04/23 90ds Atorvastatin 20mg 21m623 90ds  [id number] [address] [place]: [phone] [place] [address]: [phone] pill box? No - Ziploc baggies for AM and PM Pt endorses 100% compliance  We discussed: Benefits of medication synchronization, packaging and delivery as well as  enhanced pharmacist oversight with [employer]. Patient decided to: Continue current medication management strategy  Care Plan and Follow Up Patient Decision:  Patient agrees to Care Plan and Follow-up.  Plan: The care management  team will reach out to the patient again over the next 180 days.  Beverly Milch, PharmD Clinical Pharmacist [phone]

## 2022-01-12 ENCOUNTER — Encounter (HOSPITAL_COMMUNITY): Payer: Self-pay | Admitting: Gastroenterology

## 2022-01-13 ENCOUNTER — Encounter (HOSPITAL_COMMUNITY): Payer: Self-pay | Admitting: Gastroenterology

## 2022-01-19 ENCOUNTER — Telehealth: Payer: Self-pay | Admitting: Gastroenterology

## 2022-01-19 NOTE — Telephone Encounter (Signed)
Inbound call from [employer] in regards to prior authorization  [phone] Ext [phone]  Please advise

## 2022-01-21 ENCOUNTER — Ambulatory Visit (HOSPITAL_COMMUNITY)
Admission: RE | Admit: 2022-01-21 | Discharge: 2022-01-21 | Disposition: A | Discharge status: Home / Self Care | Payer: Medicare Other | Attending: Gastroenterology | Admitting: Gastroenterology

## 2022-01-21 ENCOUNTER — Ambulatory Visit (HOSPITAL_BASED_OUTPATIENT_CLINIC_OR_DEPARTMENT_OTHER): Payer: Medicare Other | Admitting: Anesthesiology

## 2022-01-21 ENCOUNTER — Encounter (HOSPITAL_COMMUNITY): Payer: Self-pay | Admitting: Gastroenterology

## 2022-01-21 ENCOUNTER — Encounter (HOSPITAL_COMMUNITY)
Admission: RE | Disposition: A | Discharge status: Home / Self Care | Payer: Self-pay | Source: Home / Self Care | Attending: Gastroenterology

## 2022-01-21 ENCOUNTER — Other Ambulatory Visit: Payer: Self-pay

## 2022-01-21 ENCOUNTER — Ambulatory Visit (HOSPITAL_COMMUNITY): Payer: Medicare Other | Admitting: Anesthesiology

## 2022-01-21 DIAGNOSIS — K649 Unspecified hemorrhoids: Secondary | ICD-10-CM | POA: Diagnosis not present

## 2022-01-21 DIAGNOSIS — D122 Benign neoplasm of ascending colon: Secondary | ICD-10-CM | POA: Diagnosis not present

## 2022-01-21 DIAGNOSIS — E1151 Type 2 diabetes mellitus with diabetic peripheral angiopathy without gangrene: Secondary | ICD-10-CM | POA: Insufficient documentation

## 2022-01-21 DIAGNOSIS — E119 Type 2 diabetes mellitus without complications: Secondary | ICD-10-CM

## 2022-01-21 DIAGNOSIS — K5730 Diverticulosis of large intestine without perforation or abscess without bleeding: Secondary | ICD-10-CM

## 2022-01-21 DIAGNOSIS — D124 Benign neoplasm of descending colon: Secondary | ICD-10-CM

## 2022-01-21 DIAGNOSIS — I2510 Atherosclerotic heart disease of native coronary artery without angina pectoris: Secondary | ICD-10-CM | POA: Diagnosis not present

## 2022-01-21 DIAGNOSIS — Z7984 Long term (current) use of oral hypoglycemic drugs: Secondary | ICD-10-CM | POA: Diagnosis not present

## 2022-01-21 DIAGNOSIS — D120 Benign neoplasm of cecum: Secondary | ICD-10-CM | POA: Insufficient documentation

## 2022-01-21 DIAGNOSIS — K641 Second degree hemorrhoids: Secondary | ICD-10-CM | POA: Insufficient documentation

## 2022-01-21 DIAGNOSIS — Z800 Family history of malignant neoplasm of digestive organs: Secondary | ICD-10-CM | POA: Diagnosis not present

## 2022-01-21 DIAGNOSIS — Q438 Other specified congenital malformations of intestine: Secondary | ICD-10-CM | POA: Insufficient documentation

## 2022-01-21 DIAGNOSIS — K644 Residual hemorrhoidal skin tags: Secondary | ICD-10-CM | POA: Insufficient documentation

## 2022-01-21 DIAGNOSIS — Z9889 Other specified postprocedural states: Secondary | ICD-10-CM | POA: Insufficient documentation

## 2022-01-21 DIAGNOSIS — K635 Polyp of colon: Secondary | ICD-10-CM | POA: Diagnosis not present

## 2022-01-21 DIAGNOSIS — I10 Essential (primary) hypertension: Secondary | ICD-10-CM | POA: Insufficient documentation

## 2022-01-21 DIAGNOSIS — Z87891 Personal history of nicotine dependence: Secondary | ICD-10-CM | POA: Diagnosis not present

## 2022-01-21 HISTORY — PX: HOT HEMOSTASIS: SHX5433

## 2022-01-21 HISTORY — PX: ENDOSCOPIC MUCOSAL RESECTION: SHX6839

## 2022-01-21 HISTORY — PX: COLONOSCOPY WITH PROPOFOL: SHX5780

## 2022-01-21 HISTORY — PX: POLYPECTOMY: SHX5525

## 2022-01-21 HISTORY — PX: HEMOSTASIS CLIP PLACEMENT: SHX6857

## 2022-01-21 HISTORY — PX: SUBMUCOSAL LIFTING INJECTION: SHX6855

## 2022-01-21 LAB — GLUCOSE, CAPILLARY
Glucose-Capillary: 81 mg/dL (ref 70–99)
Glucose-Capillary: 54 mg/dL — ABNORMAL LOW (ref 70–99)
Glucose-Capillary: 70 mg/dL (ref 70–99)

## 2022-01-21 SURGERY — COLONOSCOPY WITH PROPOFOL
Anesthesia: Monitor Anesthesia Care

## 2022-01-21 MED ORDER — LIDOCAINE 2% (20 MG/ML) 5 ML SYRINGE
INTRAMUSCULAR | Status: DC | PRN
  Administered 2022-01-21: 60 mg via INTRAVENOUS

## 2022-01-21 MED ORDER — PROPOFOL 10 MG/ML IV BOLUS
INTRAVENOUS | Status: DC | PRN
  Administered 2022-01-21: 30 mg via INTRAVENOUS

## 2022-01-21 MED ORDER — LACTATED RINGERS IV SOLN
INTRAVENOUS | Status: DC
  Administered 2022-01-21: 1000 mL via INTRAVENOUS

## 2022-01-21 MED ORDER — PROPOFOL 500 MG/50ML IV EMUL
INTRAVENOUS | Status: DC | PRN
  Administered 2022-01-21: 100 ug/kg/min via INTRAVENOUS

## 2022-01-21 MED ORDER — PROPOFOL 500 MG/50ML IV EMUL
INTRAVENOUS | Status: AC
  Filled 2022-01-21: qty 50

## 2022-01-21 SURGICAL SUPPLY — 22 items

## 2022-01-21 NOTE — Anesthesia Procedure Notes (Signed)
Procedure Name: MAC Date/Time: 01/21/2022 10:01 AM  Performed by: Lollie Sails, CRNAPre-anesthesia Checklist: Patient identified, Emergency Drugs available, Suction available, Patient being monitored and Timeout performed Oxygen Delivery Method: Simple face mask Placement Confirmation: positive ETCO2

## 2022-01-21 NOTE — Progress Notes (Signed)
Prior to dc home from Endo, CBG checked due to preop CBG 81. Pt value 54, reports feeling a little shaky. Coke given, Dr. Gloris Manchester notified kmb

## 2022-01-21 NOTE — Discharge Instructions (Signed)
YOU HAD AN ENDOSCOPIC PROCEDURE TODAY: Refer to the procedure report and other information in the discharge instructions given to you for any specific questions about what was found during the examination. If this information does not answer your questions, please call [place] office at [phone] to clarify.  ° °YOU SHOULD EXPECT: Some feelings of bloating in the abdomen. Passage of more gas than usual. Walking can help get rid of the air that was put into your GI tract during the procedure and reduce the bloating. If you had a lower endoscopy (such as a colonoscopy or flexible sigmoidoscopy) you may notice spotting of blood in your stool or on the toilet paper. Some abdominal soreness may be present for a day or two, also. ° °DIET: Your first meal following the procedure should be a light meal and then it is ok to progress to your normal diet. A half-sandwich or bowl of soup is an example of a good first meal. Heavy or fried foods are harder to digest and may make you feel nauseous or bloated. Drink plenty of fluids but you should avoid alcoholic beverages for 24 hours. If you had a esophageal dilation, please see attached instructions for diet.   ° °ACTIVITY: Your care partner should take you home directly after the procedure. You should plan to take it easy, moving slowly for the rest of the day. You can resume normal activity the day after the procedure however YOU SHOULD NOT DRIVE, use power tools, machinery or perform tasks that involve climbing or major physical exertion for 24 hours (because of the sedation medicines used during the test).  ° °SYMPTOMS TO REPORT IMMEDIATELY: °A gastroenterologist can be reached at any hour. Please call [phone]  for any of the following symptoms:  °Following lower endoscopy (colonoscopy, flexible sigmoidoscopy) °Excessive amounts of blood in the stool  °Significant tenderness, worsening of abdominal pains  °Swelling of the abdomen that is new, acute  °Fever of 100° or  higher  °Following upper endoscopy (EGD, EUS, ERCP, esophageal dilation) °Vomiting of blood or coffee ground material  °New, significant abdominal pain  °New, significant chest pain or pain under the shoulder blades  °Painful or persistently difficult swallowing  °New shortness of breath  °Black, tarry-looking or red, bloody stools ° °FOLLOW UP:  °If any biopsies were taken you will be contacted by phone or by letter within the next 1-3 weeks. Call [phone]  if you have not heard about the biopsies in 3 weeks.  °Please also call with any specific questions about appointments or follow up tests. ° °

## 2022-01-21 NOTE — Progress Notes (Signed)
Rechecked CBG after coke and peanut butter and crackers. CBG is 70. Dr. Gloris Manchester on unit, notified and OK for DC home. [place]

## 2022-01-21 NOTE — Op Note (Signed)
[facility] Patient Name: Belinda Day Procedure Date: 01/21/2022 MRN: [id number] Attending MD: Justice Britain , MD, [id number] Date of Birth: Aug 20, 1945 CSN: [id number] Age: 76 Admit Type: Outpatient Procedure:                Colonoscopy Indications:              Excision of colonic polyp Providers:                Justice Britain, MD, Jaci Carrel, RN,                            Luan Moore, Technician, [place] Zenia Resides CRNA,                            CRNA Referring MD:             Lear Ng, MD, Brayton Mars. Hunter MD Medicines:                Monitored Anesthesia Care Complications:            No immediate complications. Estimated Blood Loss:     Estimated blood loss was minimal. Procedure:                Pre-Anesthesia Assessment:                           - Prior to the procedure, a History and Physical                            was performed, and patient medications and                            allergies were reviewed. The patient's tolerance of                            previous anesthesia was also reviewed. The risks                            and benefits of the procedure and the sedation                            options and risks were discussed with the patient.                            All questions were answered, and informed consent                            was obtained. Prior Anticoagulants: The patient has                            taken no anticoagulant or antiplatelet agents                            except for aspirin. ASA Grade Assessment: III - A  patient with severe systemic disease. After                            reviewing the risks and benefits, the patient was                            deemed in satisfactory condition to undergo the                            procedure.                           After obtaining informed consent, the colonoscope                            was  passed under direct vision. Throughout the                            procedure, the patient's blood pressure, pulse, and                            oxygen saturations were monitored continuously. The                            PCF-HQ190L ([id number]) Olympus colonoscope was                            introduced through the anus and advanced to the 4                            cm into the ileum. The colonoscopy was technically                            difficult and complex due to a redundant colon,                            significant looping and a tortuous colon.                            Successful completion of the procedure was aided by                            changing the patient's position, straightening and                            shortening the scope to obtain bowel loop reduction                            and using scope torsion. The patient tolerated the                            procedure. The quality of the bowel preparation was  adequate. The terminal ileum, ileocecal valve,                            appendiceal orifice, and rectum were photographed. Scope In: 10:13:33 AM Scope Out: 11:06:52 AM Scope Withdrawal Time: 0 hours 45 minutes 40 seconds  Total Procedure Duration: 0 hours 53 minutes 19 seconds  Findings:      The digital rectal exam findings include hemorrhoids. Pertinent       negatives include no palpable rectal lesions.      The terminal ileum appeared normal.      A medium post mucosectomy scar was found in the cecum/ileocecal valve       region. There was residual polypoid tissue (previously confirmed       adenomatous) approximately 30 mm in size. Preparations were made for       attempt at mucosal resection. Demarcation of the lesion was performed       with high-definition white light and narrow band imaging to clearly       identify the boundaries of the lesion. EverLift was injected to raise       the lesion from the  cecal side with decent lift. Piecemeal mucosal       resection using a snare was performed. Resection and retrieval were       complete. Resected tissue margins were examined and clear of polyp       tissue. Fulguration to ablate the margin of the lesion as well as the       base by argon plasma (right colon settings) was successful. To prevent       bleeding after mucosal resection, five hemostatic clips were       successfully placed (MR conditional). Clip manufacturer: [employer]. There was no bleeding at the end of the procedure. The TI       was able to be intubated after completion of the clips to ensure no       evidence of obstruction.      Three sessile polyps were found in the descending colon, ascending colon       and cecum. The polyps were 2 to 5 mm in size. These polyps were removed       with a cold snare. Resection and retrieval were complete.      Many medium-mouthed and small-mouthed diverticula were found in the       entire colon.      Normal mucosa was found in the entire colon otherwise.      Non-bleeding non-thrombosed external and internal hemorrhoids were found       during retroflexion, during perianal exam and during digital exam. The       hemorrhoids were Grade II (internal hemorrhoids that prolapse but reduce       spontaneously). Impression:               - Hemorrhoids found on digital rectal exam.                           - The examined portion of the ileum was normal.                           - Post mucosectomy scar in the cecum/ileocecal  valve region. Residual/recurrent adenomatous tissue                            present (approximately 30 mm in size). Piecemeal                            mucosal resection performed. Treated with argon                            plasma coagulation (APC) to base/margin. Clips (MR                            conditional) were placed. Clip manufacturer: [employer].                           - Three 2 to 5 mm polyps in the descending colon,                            in the ascending colon and in the cecum, removed                            with a cold snare. Resected and retrieved.                           - Diverticulosis in the entire examined colon.                           - Normal mucosa in the entire examined colon                            otherwise.                           - Non-bleeding non-thrombosed external and internal                            hemorrhoids. Moderate Sedation:      Not Applicable - Patient had care per Anesthesia. Recommendation:           - The patient will be observed post-procedure,                            until all discharge criteria are met.                           - Discharge patient to home.                           - Patient has a contact number available for                            emergencies. The signs and symptoms of potential  delayed complications were discussed with the                            patient. Return to normal activities tomorrow.                            Written discharge instructions were provided to the                            patient.                           - High fiber diet.                           - Use FiberCon 1-2 tablets PO daily.                           - Continue present medications.                           - Await pathology results.                           - Repeat colonoscopy in 6-9 months for surveillance                            based on pathology results.                           - The findings and recommendations were discussed                            with the patient.                           - The findings and recommendations were discussed                            with the designated responsible adult. Procedure Code(s):        --- Professional ---                           [id number],  Colonoscopy, flexible; with endoscopic                            mucosal resection                           45385, 54, Colonoscopy, flexible; with removal of                            tumor(s), polyp(s), or other lesion(s) by snare                            technique Diagnosis Code(s):        --- Professional ---  K64.1, Second degree hemorrhoids                           Z98.890, Other specified postprocedural states                           D12.4, Benign neoplasm of descending colon                           D12.2, Benign neoplasm of ascending colon                           D12.0, Benign neoplasm of cecum                           K63.5, Polyp of colon                           K57.30, Diverticulosis of large intestine without                            perforation or abscess without bleeding CPT copyright 2022 American Medical Association. All rights reserved. The codes documented in this report are preliminary and upon coder review may  be revised to meet current compliance requirements. Justice Britain, MD 01/21/2022 11:23:56 AM Number of Addenda: 0

## 2022-01-21 NOTE — Transfer of Care (Signed)
Immediate Anesthesia Transfer of Care Note  Patient: Belinda Day  Procedure(s) Performed: COLONOSCOPY WITH PROPOFOL ENDOSCOPIC MUCOSAL RESECTION POLYPECTOMY HOT HEMOSTASIS (ARGON PLASMA COAGULATION/BICAP) HEMOSTASIS CLIP PLACEMENT  Patient Location: PACU and Endoscopy Unit  Anesthesia Type:MAC  Level of Consciousness: awake, alert , and oriented  Airway & Oxygen Therapy: Patient Spontanous Breathing  Post-op Assessment: Report given to RN and Post -op Vital signs reviewed and stable  Post vital signs: Reviewed and stable  Last Vitals:  Vitals Value Taken Time  BP 129/45 01/21/22 1115  Temp    Pulse 59 01/21/22 1116  Resp 19 01/21/22 1116  SpO2 100 % 01/21/22 1116  Vitals shown include unvalidated device data.  Last Pain:  Vitals:   01/21/22 1114  TempSrc: Tympanic  PainSc: Asleep         Complications: No notable events documented.

## 2022-01-21 NOTE — Anesthesia Preprocedure Evaluation (Signed)
Anesthesia Evaluation  Patient identified by MRN, date of birth, ID band Patient awake    Reviewed: Allergy & Precautions, NPO status , Patient's Chart, lab work & pertinent test results  Airway Mallampati: II  TM Distance: >3 FB Neck ROM: Full    Dental no notable dental hx.    Pulmonary neg pulmonary ROS, former smoker   Pulmonary exam normal        Cardiovascular hypertension, Pt. on medications and Pt. on home beta blockers + CAD and + Peripheral Vascular Disease   Rhythm:Regular Rate:Normal     Neuro/Psych negative neurological ROS  negative psych ROS   GI/Hepatic Neg liver ROS,GERD  ,,  Endo/Other  diabetes, Type 2, Oral Hypoglycemic AgentsHypothyroidism    Renal/GU negative Renal ROS  negative genitourinary   Musculoskeletal  (+)  Fibromyalgia -  Abdominal Normal abdominal exam  (+)   Peds  Hematology negative hematology ROS (+)   Anesthesia Other Findings   Reproductive/Obstetrics                             Anesthesia Physical Anesthesia Plan  ASA: 2  Anesthesia Plan: MAC   Post-op Pain Management:    Induction: Intravenous  PONV Risk Score and Plan: 2 and Propofol infusion and Treatment may vary due to age or medical condition  Airway Management Planned: Simple Face Mask, Natural Airway and Nasal Cannula  Additional Equipment: None  Intra-op Plan:   Post-operative Plan:   Informed Consent: I have reviewed the patients History and Physical, chart, labs and discussed the procedure including the risks, benefits and alternatives for the proposed anesthesia with the patient or authorized representative who has indicated his/her understanding and acceptance.     Dental advisory given  Plan Discussed with: CRNA  Anesthesia Plan Comments:        Anesthesia Quick Evaluation

## 2022-01-21 NOTE — Anesthesia Postprocedure Evaluation (Signed)
Anesthesia Post Note  Patient: Belinda Day  Procedure(s) Performed: COLONOSCOPY WITH PROPOFOL ENDOSCOPIC MUCOSAL RESECTION POLYPECTOMY HOT HEMOSTASIS (ARGON PLASMA COAGULATION/BICAP) HEMOSTASIS CLIP PLACEMENT     Patient location during evaluation: Endoscopy Anesthesia Type: MAC Level of consciousness: awake and alert Pain management: pain level controlled Vital Signs Assessment: post-procedure vital signs reviewed and stable Respiratory status: spontaneous breathing, nonlabored ventilation, respiratory function stable and patient connected to nasal cannula oxygen Cardiovascular status: stable and blood pressure returned to baseline Postop Assessment: no apparent nausea or vomiting Anesthetic complications: no   No notable events documented.  Last Vitals:  Vitals:   01/21/22 1124 01/21/22 1134  BP: (!) 145/35 (!) 145/42  Pulse: 61 (!) 56  Resp: 14 14  Temp:    SpO2: 96% 96%    Last Pain:  Vitals:   01/21/22 1134  TempSrc:   PainSc: 0-No pain                 [name]

## 2022-01-21 NOTE — H&P (Signed)
GASTROENTEROLOGY PROCEDURE H&P NOTE   Primary Care Physician: Marin Olp, MD  HPI: Belinda Day is a 76 y.o. female who presents for Colonoscopy for attempt at removal of ICV adenoma s/p 4 prior colonoscopies and prior Advanced resection at [facility] to try and remove without surgery.  Past Medical History:  Diagnosis Date   Bronchiectasis    oxygen at night in the past   CAD (coronary artery disease)    Diverticulitis 01/26/2012   GERD (gastroesophageal reflux disease)    History of shingles 04/25/2012   HTN (hypertension)    Hyperlipidemia    Hypothyroidism    MAI (mycobacterium avium-intracellulare) (HCC)    Neuritis of upper extremity    Past Surgical History:  Procedure Laterality Date   CORONARY ARTERY BYPASS GRAFT  01/25/2002   x3 CABG   HAND SURGERY     Dr. Amedeo Plenty sept 2022- improved hand movement   Current Facility-Administered Medications  Medication Dose Route Frequency Provider Last Rate Last Admin   lactated ringers infusion   Intravenous Continuous Mansouraty, Telford Nab., MD 10 mL/hr at 01/21/22 0929 1,000 mL at 01/21/22 8938    Current Facility-Administered Medications:    lactated ringers infusion, , Intravenous, Continuous, Mansouraty, Telford Nab., MD, Last Rate: 10 mL/hr at 01/21/22 0929, 1,000 mL at 01/21/22 1017 Allergies  Allergen Reactions   Bactrim [Sulfamethoxazole-Trimethoprim] Hives, Itching and Other (See Comments)    Bruised like areas on body   Ciprofloxacin Other (See Comments)    Body aches   Codeine Nausea Only    REACTION: nausea   Erythromycin Nausea Only    REACTION: nausea   Levaquin [Levofloxacin] Other (See Comments)    REACTION: aches   Family History  Problem Relation Age of Onset   Colon cancer Mother    Hyperlipidemia Mother    Hypertension Mother    Heart disease Mother    Stroke Mother    Diabetes Mother    Colon cancer Father    Arthritis Father    Hyperlipidemia Father    Hypertension Father    Heart  disease Father    Stroke Father    Colon cancer Maternal Grandmother    Atopy Neg Hx    Stomach cancer Neg Hx    Esophageal cancer Neg Hx    Pancreatic cancer Neg Hx    Inflammatory bowel disease Neg Hx    Liver disease Neg Hx    Rectal cancer Neg Hx    Social History   Socioeconomic History   Marital status: Single    Spouse name: Not on file   Number of children: Not on file   Years of education: Not on file   Highest education level: Not on file  Occupational History   Occupation: Retired   Tobacco Use   Smoking status: Former    Packs/day: 1.00    Years: 20.00    Total pack years: 20.00    Types: Cigarettes    Quit date: 01/25/1970    Years since quitting: 52.0   Smokeless tobacco: Never  Substance and Sexual Activity   Alcohol use: No   Drug use: No   Sexual activity: Not on file  Other Topics Concern   Not on file  Social History Narrative   Family: Single never married, no children, cat and rehabs turtles and tortoise   Went to [employer] in [employer] and social work, some business courses at [employer], EMT for 6 years.    LIves alone. Completely  independent.    Lives in retirement community.       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      Hobbies: kayaking, gardening- mows own lawn   Social Determinants of Health   Financial Resource Strain: Low Risk  (07/06/2021)   Overall Financial Resource Strain (CARDIA)    Difficulty of Paying Living Expenses: Not hard at all  Food Insecurity: No Food Insecurity (07/06/2021)   Hunger Vital Sign    Worried About Running Out of Food in the Last Year: Never true    [place] in the Last Year: Never true  Transportation Needs: No Transportation Needs (07/06/2021)   PRAPARE - Hydrologist (Medical): No    Lack of Transportation (Non-Medical): No  Physical Activity: Sufficiently Active (07/06/2021)   Exercise Vital Sign    Days of  Exercise per Week: 5 days    Minutes of Exercise per Session: 90 min  Stress: No Stress Concern Present (07/06/2021)   [place]    Feeling of Stress : Not at all  Social Connections: Moderately Integrated (07/06/2021)   Social Connection and Isolation Panel [NHANES]    Frequency of Communication with Friends and Family: More than three times a week    Frequency of Social Gatherings with Friends and Family: Twice a week    Attends Religious Services: More than 4 times per year    Active Member of [employer] or Organizations: No    Attends Archivist Meetings: Never    Marital Status: Living with partner  Intimate Partner Violence: Not At Risk (07/06/2021)   Humiliation, Afraid, Rape, and Kick questionnaire    Fear of Current or Ex-Partner: No    Emotionally Abused: No    Physically Abused: No    Sexually Abused: No    Physical Exam: Today's Vitals   01/21/22 0921  BP: (!) 163/56  Pulse: 76  Resp: 19  Temp: 98.8 F (37.1 C)  TempSrc: Temporal  SpO2: 100%  Weight: 50.3 kg  Height: '5\' 1"'$  (1.549 m)  PainSc: 0-No pain   Body mass index is 20.97 kg/m. GEN: NAD EYE: Sclerae anicteric ENT: MMM CV: Non-tachycardic GI: Soft, NT/ND NEURO:  Alert & Oriented x 3  Lab Results: No results for input(s): "WBC", "HGB", "HCT", "PLT" in the last 72 hours. BMET No results for input(s): "NA", "K", "CL", "CO2", "GLUCOSE", "BUN", "CREATININE", "CALCIUM" in the last 72 hours. LFT No results for input(s): "PROT", "ALBUMIN", "AST", "ALT", "ALKPHOS", "BILITOT", "BILIDIR", "IBILI" in the last 72 hours. PT/INR No results for input(s): "LABPROT", "INR" in the last 72 hours.   Impression / Plan: This is a 76 y.o.female who presents for Colonoscopy for attempt at removal of ICV adenoma s/p 4 prior colonoscopies and prior Advanced resection at [facility] to try and remove without surgery.  The risks and benefits of endoscopic  evaluation/treatment were discussed with the patient and/or family; these include but are not limited to the risk of perforation, infection, bleeding, missed lesions, lack of diagnosis, severe illness requiring hospitalization, as well as anesthesia and sedation related illnesses.  The patient's history has been reviewed, patient examined, no change in status, and deemed stable for procedure.  The patient and/or family is agreeable to proceed.    Justice Britain, MD [facility] Advanced Endoscopy Office # [phone]

## 2022-01-22 LAB — SURGICAL PATHOLOGY

## 2022-01-23 ENCOUNTER — Encounter (HOSPITAL_COMMUNITY): Payer: Self-pay | Admitting: Gastroenterology

## 2022-01-23 ENCOUNTER — Encounter: Payer: Self-pay | Admitting: Gastroenterology

## 2022-01-28 ENCOUNTER — Telehealth: Payer: Self-pay

## 2022-01-28 NOTE — Progress Notes (Signed)
  Chronic Care Management Note  01/28/2022 Name: JELESA MANGINI MRN: [id number] DOB: 1945/04/24  QUANNA WITTKE is a 77 y.o. year old female who is a primary care patient of Marin Olp, MD and is actively engaged with the care management team. I reached out to [name] by phone today to assist with scheduling a follow up visit with the Pharmacist  Follow up plan: Patient states that she is well and does not need a f/u with Pharm d at this time    Noreene Larsson, [place], [place] [id number] Direct Dial: [phone] [email or web address]

## 2022-02-01 ENCOUNTER — Telehealth: Payer: Medicare Other

## 2022-02-07 ENCOUNTER — Other Ambulatory Visit: Payer: Self-pay | Admitting: Internal Medicine

## 2022-03-01 ENCOUNTER — Other Ambulatory Visit: Payer: Self-pay | Admitting: Internal Medicine

## 2022-03-09 NOTE — Progress Notes (Unsigned)
Cardiology Office Note:    Date:  03/10/2022   ID:  Belinda Day, DOB 05-Mar-1945, MRN [id number]  PCP:  Marin Olp, MD [place] Cardiologist: Kirk Ruths, MD   Reason for visit: 1 year follow-up  History of Present Illness:    Belinda Day is a 77 y.o. female with a hx of CAD status post coronary artery bypass in 2002, hypertension, prediabetes, bronchiectasis and dyslipidemia.   She was last seen by Dr. Stanford Breed in January 2022 when she was doing well with stable dyspnea secondary to her lung disease.  I last saw her in 01/2021.  She has chronic cough with her bronchiectasis.  No exertional chest pain.    Today, she comes in and states she is doing well.  Over the past year, she had hand surgery and she has had colonoscopies x 3 to follow-up on a colon polyp.  Per her report, she has done well with anesthesia.  She continues with a chronic cough using a daily inhaler and antibiotic.  She has not needed oxygen.  She will get short of breath if she is doing significant exertion quickly, no significant shortness of breath with her ADLs.  She mentions chronic cramping in her chest that may last a minute.  This is under her left breast with associated pounding and irregular heartbeat.  She may get once a month and tries to change positions to get it to pass.  If she has significant coughing, she will feel a grabbing sensation in her chest.  She mentions 2 episodes of feeling some pressure in her chest to her back which have occurred at rest.  She chewed a couple aspirin and symptoms went away in a few minutes.  She denies exertional chest pain.  She mentions occasional elevated BPs.    Past Medical History:  Diagnosis Date   Bronchiectasis    oxygen at night in the past   CAD (coronary artery disease)    Diverticulitis 01/26/2012   GERD (gastroesophageal reflux disease)    History of shingles 04/25/2012   HTN (hypertension)    Hyperlipidemia    Hypothyroidism     MAI (mycobacterium avium-intracellulare) (HCC)    Neuritis of upper extremity     Past Surgical History:  Procedure Laterality Date   COLONOSCOPY WITH PROPOFOL N/A 01/21/2022   Procedure: COLONOSCOPY WITH PROPOFOL;  Surgeon: Irving Copas., MD;  Location: [facility];  Service: Gastroenterology;  Laterality: N/A;   CORONARY ARTERY BYPASS GRAFT  01/25/2002   x3 CABG   ENDOSCOPIC MUCOSAL RESECTION N/A 01/21/2022   Procedure: ENDOSCOPIC MUCOSAL RESECTION;  Surgeon: Rush Landmark Telford Nab., MD;  Location: [facility];  Service: Gastroenterology;  Laterality: N/A;   HAND SURGERY     Dr. Amedeo Plenty sept 2022- improved hand movement   HEMOSTASIS CLIP PLACEMENT  01/21/2022   Procedure: HEMOSTASIS CLIP PLACEMENT;  Surgeon: Irving Copas., MD;  Location: [facility];  Service: Gastroenterology;;   HOT HEMOSTASIS N/A 01/21/2022   Procedure: HOT HEMOSTASIS (ARGON PLASMA COAGULATION/BICAP);  Surgeon: Irving Copas., MD;  Location: [facility];  Service: Gastroenterology;  Laterality: N/A;   POLYPECTOMY  01/21/2022   Procedure: POLYPECTOMY;  Surgeon: Rush Landmark Telford Nab., MD;  Location: [facility];  Service: Gastroenterology;;   Lia [place] LIFTING INJECTION  01/21/2022   Procedure: SUBMUCOSAL LIFTING INJECTION;  Surgeon: Irving Copas., MD;  Location: [facility];  Service: Gastroenterology;;    Current Medications: Current Meds  Medication Sig   acetaminophen (TYLENOL) 650 MG CR tablet  Take 650 mg by mouth every 8 (eight) hours as needed for pain.   albuterol (PROAIR HFA) 108 (90 Base) MCG/ACT inhaler Inhale 2 puffs into the lungs every 6 (six) hours as needed for wheezing or shortness of breath.   ALPRAZolam (XANAX) 0.25 MG tablet TAKE 1 TABLET BY MOUTH AT BEDTIME AS NEEDED FOR SLEEP. May take very sparingly for daytime anxiety at least 8 hours from nighttime dose. Do not drive for 8 hours after taking   amitriptyline (ELAVIL) 50 MG tablet Take 1 tablet  (50 mg total) by mouth at bedtime.   aspirin EC 81 MG tablet Take 81 mg by mouth daily.   azithromycin (ZITHROMAX) 250 MG tablet TAKE 1 TABLET(250 MG TOTAL) BY MOUTH DAILY   calcium carbonate (OS-CAL - DOSED IN MG OF ELEMENTAL CALCIUM) 1250 (500 Ca) MG tablet Take 1 tablet by mouth daily.   cholecalciferol (VITAMIN D3) 25 MCG (1000 UNIT) tablet Take 1,000 Units by mouth daily.   Coenzyme Q10 (COQ10) 200 MG CAPS Take 200 mg by mouth daily.   cyanocobalamin (VITAMIN B12) 1000 MCG tablet Take 1,000 mcg by mouth daily.   Dextromethorphan-guaiFENesin (MUCINEX DM MAXIMUM STRENGTH) 60-1200 MG TB12 Take 1 tablet by mouth daily.   Melatonin 5 MG TABS Take 5 mg by mouth at bedtime.   metFORMIN (GLUCOPHAGE) 500 MG tablet TAKE 1 TABLET(500 MG) BY MOUTH TWICE DAILY WITH A MEAL   mometasone-formoterol (DULERA) 200-5 MCG/ACT AERO INHALE 2 PUFFS INTO THE LUNGS EVERY MORNING AND EVERY NIGHT AT BEDTIME   Multiple Vitamin (MULTIVITAMIN) tablet Take 1 tablet by mouth daily.   nebivolol (BYSTOLIC) 2.5 MG tablet TAKE 1 TABLET(2.5 MG) BY MOUTH DAILY   ondansetron (ZOFRAN-ODT) 8 MG disintegrating tablet Dissolve 1 tablet under the tongue 30 mins prior to drinking preparation for colonoscopy.   Respiratory Therapy Supplies (FLUTTER) DEVI Use as directed   SYNTHROID 75 MCG tablet TAKE 1 TABLET(75 MCG) BY MOUTH DAILY BEFORE AND BREAKFAST   telmisartan (MICARDIS) 80 MG tablet TAKE 1 TABLET(80 MG) BY MOUTH DAILY   [DISCONTINUED] atorvastatin (LIPITOR) 80 MG tablet TAKE 1 TABLET(80 MG) BY MOUTH DAILY     Allergies:   Bactrim [sulfamethoxazole-trimethoprim], Ciprofloxacin, Codeine, Erythromycin, and Levaquin [levofloxacin]   Social History   Socioeconomic History   Marital status: Single    Spouse name: Not on file   Number of children: Not on file   Years of education: Not on file   Highest education level: Not on file  Occupational History   Occupation: Retired   Tobacco Use   Smoking status: Former     Packs/day: 1.00    Years: 20.00    Total pack years: 20.00    Types: Cigarettes    Quit date: 01/25/1970    Years since quitting: 52.1   Smokeless tobacco: Never  Substance and Sexual Activity   Alcohol use: No   Drug use: No   Sexual activity: Not on file  Other Topics Concern   Not on file  Social History Narrative   Family: Single never married, no children, cat and rehabs turtles and tortoise   Went to [employer] in [employer] and social work, some business courses at [employer], EMT for 6 years.    LIves alone. Completely independent.    Lives in retirement community.       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      Hobbies: kayaking, gardening- mows own lawn   Social Determinants of Health  Financial Resource Strain: Low Risk  (07/06/2021)   Overall Financial Resource Strain (CARDIA)    Difficulty of Paying Living Expenses: Not hard at all  Food Insecurity: No Food Insecurity (07/06/2021)   Hunger Vital Sign    Worried About Running Out of Food in the Last Year: Never true    Ran Out of Food in the Last Year: Never true  Transportation Needs: No Transportation Needs (07/06/2021)   PRAPARE - Hydrologist (Medical): No    Lack of Transportation (Non-Medical): No  Physical Activity: Sufficiently Active (07/06/2021)   Exercise Vital Sign    Days of Exercise per Week: 5 days    Minutes of Exercise per Session: 90 min  Stress: No Stress Concern Present (07/06/2021)   [place]    Feeling of Stress : Not at all  Social Connections: Moderately Integrated (07/06/2021)   Social Connection and Isolation Panel [NHANES]    Frequency of Communication with Friends and Family: More than three times a week    Frequency of Social Gatherings with Friends and Family: Twice a week    Attends Religious Services: More than 4 times per year     Active Member of [employer] or Organizations: No    Attends Music therapist: Never    Marital Status: Living with partner     Family History: The patient's family history includes Arthritis in her father; Colon cancer in her father, maternal grandmother, and mother; Diabetes in her mother; Heart disease in her father and mother; Hyperlipidemia in her father and mother; Hypertension in her father and mother; Stroke in her father and mother. There is no history of Atopy, Stomach cancer, Esophageal cancer, Pancreatic cancer, Inflammatory bowel disease, Liver disease, or Rectal cancer.  ROS:   Please see the history of present illness.     EKGs/Labs/Other Studies Reviewed:    EKG:  The ekg ordered today demonstrates normal sinus rhythm, heart rate 77.  Recent Labs: 12/02/2021: ALT 12; BUN 16; Creatinine, Ser 0.76; Hemoglobin 12.7; Platelets 344.0; Potassium 4.0; Sodium 136; TSH 1.73   Recent Lipid Panel Lab Results  Component Value Date/Time   CHOL 128 06/01/2021 10:00 AM   TRIG 67.0 06/01/2021 10:00 AM   HDL 71.70 06/01/2021 10:00 AM   LDLCALC 43 06/01/2021 10:00 AM   LDLDIRECT 51.0 11/28/2020 11:21 AM    Physical Exam:    VS:  BP 128/74   Pulse 77   Ht 5' 1"$  (1.549 m)   Wt 113 lb 3.2 oz (51.3 kg)   SpO2 96%   BMI 21.39 kg/m    No data found.       Wt Readings from Last 3 Encounters:  03/10/22 113 lb 3.2 oz (51.3 kg)  01/21/22 111 lb (50.3 kg)  12/02/21 113 lb (51.3 kg)     GEN:  Well nourished, well developed in no acute distress HEENT: Normal NECK: No JVD; No carotid bruits CARDIAC: RRR, no murmurs, rubs, gallops RESPIRATORY: Rhonchi bilaterally ABDOMEN: Soft, non-tender, non-distended MUSCULOSKELETAL: No edema SKIN: Warm and dry NEUROLOGIC:  Alert and oriented PSYCHIATRIC:  Normal affect     ASSESSMENT AND PLAN   Coronary artery disease, stable -Hx of CABG 2002 -No exertional chest pain.  EKG without ischemic changes.   -If she develops  exertional chest pain or chest discomfort episodes increase in frequency/intensity, we could consider cardiac PET. -Continue beta-blocker, statin and aspirin therapy.   Hypertension, well controlled -BP  within normal range in the last 3 readings in her chart.  She mentioned occasional blood pressure spikes.  Recommend taking Micardis at night to separate BP meds and hopefully get a more even blood pressure control.   Hyperlipidemia with goal LDL less than 70 -LDL 43 in May 2023.  Continue Lipitor 80 mg daily - refilled.   Disposition - Follow-up in 1 year unless symptoms change.   Medication Adjustments/Labs and Tests Ordered: Current medicines are reviewed at length with the patient today.  Concerns regarding medicines are outlined above.  Orders Placed This Encounter  Procedures   EKG 12-Lead   Meds ordered this encounter  Medications   atorvastatin (LIPITOR) 80 MG tablet    Sig: TAKE 1 TABLET(80 MG) BY MOUTH DAILY    Dispense:  90 tablet    Refill:  1    Patient will require appointment for additional refills.    Patient Instructions  Medication Instructions:  Micardis 80 mg ( Take 1 Tablet Daily In The Evening). *If you need a refill on your cardiac medications before your next appointment, please call your pharmacy*   Lab Work: No labs If you have labs (blood work) drawn today and your tests are completely normal, you will receive your results only by: [place] (if you have MyChart) OR A paper copy in the mail If you have any lab test that is abnormal or we need to change your treatment, we will call you to review the results.   Testing/Procedures: No Testing   Follow-Up: At [facility], you and your health needs are our priority.  As part of our continuing mission to provide you with exceptional heart care, we have created designated Provider Care Teams.  These Care Teams include your primary Cardiologist (physician) and Advanced Practice Providers  (APPs -  Physician Assistants and Nurse Practitioners) who all work together to provide you with the care you need, when you need it.  We recommend signing up for the patient portal called "MyChart".  Sign up information is provided on this After Visit Summary.  MyChart is used to connect with patients for Virtual Visits (Telemedicine).  Patients are able to view lab/test results, encounter notes, upcoming appointments, etc.  Non-urgent messages can be sent to your provider as well.   To learn more about what you can do with MyChart, go to [email or web address].    Your next appointment:   1 year(s)  Provider:   Kirk Ruths, MD     Signed, Warren Lacy, PA-C  03/10/2022 12:27 PM    [facility]

## 2022-03-10 ENCOUNTER — Encounter: Payer: Self-pay | Admitting: Physician Assistant

## 2022-03-10 ENCOUNTER — Ambulatory Visit: Payer: Medicare Other | Attending: Physician Assistant | Admitting: Physician Assistant

## 2022-03-10 VITALS — BP 128/74 | HR 77 | Ht 61.0 in | Wt 113.2 lb

## 2022-03-10 DIAGNOSIS — I10 Essential (primary) hypertension: Secondary | ICD-10-CM

## 2022-03-10 DIAGNOSIS — I2510 Atherosclerotic heart disease of native coronary artery without angina pectoris: Secondary | ICD-10-CM | POA: Diagnosis not present

## 2022-03-10 DIAGNOSIS — E7800 Pure hypercholesterolemia, unspecified: Secondary | ICD-10-CM | POA: Diagnosis not present

## 2022-03-10 MED ORDER — ATORVASTATIN CALCIUM 80 MG PO TABS: ORAL_TABLET | ORAL | 1 refills | Status: DC

## 2022-03-10 NOTE — Patient Instructions (Signed)
Medication Instructions:  Micardis 80 mg ( Take 1 Tablet Daily In The Evening). *If you need a refill on your cardiac medications before your next appointment, please call your pharmacy*   Lab Work: No labs If you have labs (blood work) drawn today and your tests are completely normal, you will receive your results only by: [place] (if you have MyChart) OR A paper copy in the mail If you have any lab test that is abnormal or we need to change your treatment, we will call you to review the results.   Testing/Procedures: No Testing   Follow-Up: At [facility], you and your health needs are our priority.  As part of our continuing mission to provide you with exceptional heart care, we have created designated Provider Care Teams.  These Care Teams include your primary Cardiologist (physician) and Advanced Practice Providers (APPs -  Physician Assistants and Nurse Practitioners) who all work together to provide you with the care you need, when you need it.  We recommend signing up for the patient portal called "MyChart".  Sign up information is provided on this After Visit Summary.  MyChart is used to connect with patients for Virtual Visits (Telemedicine).  Patients are able to view lab/test results, encounter notes, upcoming appointments, etc.  Non-urgent messages can be sent to your provider as well.   To learn more about what you can do with MyChart, go to [email or web address].    Your next appointment:   1 year(s)  Provider:   Kirk Ruths, MD

## 2022-03-19 DIAGNOSIS — Z1231 Encounter for screening mammogram for malignant neoplasm of breast: Secondary | ICD-10-CM | POA: Diagnosis not present

## 2022-03-19 LAB — HM MAMMOGRAPHY

## 2022-05-05 ENCOUNTER — Other Ambulatory Visit: Payer: Self-pay | Admitting: Internal Medicine

## 2022-05-27 ENCOUNTER — Other Ambulatory Visit: Payer: Self-pay

## 2022-05-27 MED ORDER — AMITRIPTYLINE HCL 50 MG PO TABS: 50.0000 mg | ORAL_TABLET | Freq: Every day | ORAL | 3 refills | Status: DC

## 2022-05-27 MED ORDER — METFORMIN HCL 500 MG PO TABS: ORAL_TABLET | ORAL | 3 refills | Status: DC

## 2022-06-03 ENCOUNTER — Other Ambulatory Visit: Payer: Self-pay

## 2022-06-03 MED ORDER — ALPRAZOLAM 0.25 MG PO TABS: ORAL_TABLET | ORAL | 0 refills | Status: DC

## 2022-06-04 ENCOUNTER — Encounter: Payer: Medicare Other | Admitting: Family Medicine

## 2022-06-30 ENCOUNTER — Encounter: Payer: Self-pay | Admitting: Family Medicine

## 2022-06-30 ENCOUNTER — Ambulatory Visit (INDEPENDENT_AMBULATORY_CARE_PROVIDER_SITE_OTHER): Payer: Medicare Other | Admitting: Family Medicine

## 2022-06-30 VITALS — BP 122/60 | HR 60 | Temp 97.5°F | Ht 61.0 in | Wt 115.4 lb

## 2022-06-30 DIAGNOSIS — M85851 Other specified disorders of bone density and structure, right thigh: Secondary | ICD-10-CM

## 2022-06-30 DIAGNOSIS — Z7984 Long term (current) use of oral hypoglycemic drugs: Secondary | ICD-10-CM

## 2022-06-30 DIAGNOSIS — I2583 Coronary atherosclerosis due to lipid rich plaque: Secondary | ICD-10-CM

## 2022-06-30 DIAGNOSIS — I700 Atherosclerosis of aorta: Secondary | ICD-10-CM

## 2022-06-30 DIAGNOSIS — I2510 Atherosclerotic heart disease of native coronary artery without angina pectoris: Secondary | ICD-10-CM | POA: Diagnosis not present

## 2022-06-30 DIAGNOSIS — E119 Type 2 diabetes mellitus without complications: Secondary | ICD-10-CM

## 2022-06-30 DIAGNOSIS — E039 Hypothyroidism, unspecified: Secondary | ICD-10-CM

## 2022-06-30 DIAGNOSIS — J479 Bronchiectasis, uncomplicated: Secondary | ICD-10-CM

## 2022-06-30 DIAGNOSIS — A310 Pulmonary mycobacterial infection: Secondary | ICD-10-CM | POA: Diagnosis not present

## 2022-06-30 DIAGNOSIS — Z0000 Encounter for general adult medical examination without abnormal findings: Secondary | ICD-10-CM | POA: Diagnosis not present

## 2022-06-30 DIAGNOSIS — I10 Essential (primary) hypertension: Secondary | ICD-10-CM | POA: Diagnosis not present

## 2022-06-30 LAB — COMPREHENSIVE METABOLIC PANEL
ALT: 16 U/L (ref 0–35)
AST: 26 U/L (ref 0–37)
Albumin: 4.1 g/dL (ref 3.5–5.2)
Alkaline Phosphatase: 76 U/L (ref 39–117)
BUN: 10 mg/dL (ref 6–23)
CO2: 25 mEq/L (ref 19–32)
Calcium: 9.3 mg/dL (ref 8.4–10.5)
Chloride: 101 mEq/L (ref 96–112)
Creatinine, Ser: 0.77 mg/dL (ref 0.40–1.20)
GFR: 73.44 mL/min (ref >60.00)
Glucose, Bld: 88 mg/dL (ref 70–99)
Potassium: 4 mEq/L (ref 3.5–5.1)
Sodium: 138 mEq/L (ref 135–145)
Total Bilirubin: 0.5 mg/dL (ref 0.2–1.2)
Total Protein: 6.5 g/dL (ref 6.0–8.3)

## 2022-06-30 LAB — CBC WITH DIFFERENTIAL/PLATELET
Basophils Absolute: 0 10*3/uL (ref 0.0–0.1)
Basophils Relative: 0.7 % (ref 0.0–3.0)
Eosinophils Absolute: 0 10*3/uL (ref 0.0–0.7)
Eosinophils Relative: 0.8 % (ref 0.0–5.0)
HCT: 41.1 % (ref 36.0–46.0)
Hemoglobin: 13.5 g/dL (ref 12.0–15.0)
Lymphocytes Relative: 20.4 % (ref 12.0–46.0)
Lymphs Abs: 1.2 10*3/uL (ref 0.7–4.0)
MCHC: 32.9 g/dL (ref 30.0–36.0)
MCV: 96.3 fl (ref 78.0–100.0)
Monocytes Absolute: 0.4 10*3/uL (ref 0.1–1.0)
Monocytes Relative: 6.8 % (ref 3.0–12.0)
Neutro Abs: 4.1 10*3/uL (ref 1.4–7.7)
Neutrophils Relative %: 71.3 % (ref 43.0–77.0)
Platelets: 307 10*3/uL (ref 150.0–400.0)
RBC: 4.27 Mil/uL (ref 3.87–5.11)
RDW: 13.4 % (ref 11.5–15.5)
WBC: 5.8 10*3/uL (ref 4.0–10.5)

## 2022-06-30 LAB — LIPID PANEL
Cholesterol: 127 mg/dL (ref 0–200)
HDL: 65.3 mg/dL (ref >39.00)
LDL Cholesterol: 43 mg/dL (ref 0–99)
NonHDL: 61.39
Total CHOL/HDL Ratio: 2
Triglycerides: 90 mg/dL (ref 0.0–149.0)
VLDL: 18 mg/dL (ref 0.0–40.0)

## 2022-06-30 LAB — HEMOGLOBIN A1C: Hgb A1c MFr Bld: 5.9 % (ref 4.6–6.5)

## 2022-06-30 LAB — TSH: TSH: 1.18 u[IU]/mL (ref 0.35–5.50)

## 2022-06-30 MED ORDER — ALPRAZOLAM 0.25 MG PO TABS: ORAL_TABLET | ORAL | 0 refills | Status: DC

## 2022-06-30 MED ORDER — SYNTHROID 75 MCG PO TABS: ORAL_TABLET | ORAL | 3 refills | Status: DC

## 2022-06-30 NOTE — Progress Notes (Signed)
Phone [phone]   Subjective:  Patient presents today for their annual physical. Chief complaint-noted.   See problem oriented charting- ROS- full  review of systems was completed and negative except for: frequent headaches starting a month ago, tinnitus stable, cough and wheezing per baseline, constipation stable since age 77, joint pain in hands and muscle aches, bruises easily   The following were reviewed and entered/updated in epic: Past Medical History:  Diagnosis Date   Bronchiectasis    oxygen at night in the past   CAD (coronary artery disease)    COPD (chronic obstructive pulmonary disease) (HCC) bronchiectasis   Diabetes mellitus without complication (HCC) pre-diabetes   Diverticulitis 01/26/2012   GERD (gastroesophageal reflux disease)    History of shingles 04/25/2012   HTN (hypertension)    Hyperlipidemia    Hypothyroidism    MAI (mycobacterium avium-intracellulare) (HCC)    Neuritis of upper extremity    Patient Active Problem List   Diagnosis Date Noted   Diabetes mellitus type 2 in nonobese (HCC) 09/07/2017    Priority: High   Osteopenia 08/14/2011    Priority: High   CAD (coronary artery disease) s/p CABG 07/03/2007    Priority: High   Aortic atherosclerosis (HCC) 05/28/2020    Priority: Medium    Hyperglycemia 08/05/2014    Priority: Medium    Insomnia 02/12/2013    Priority: Medium    Nocturnal hypoxemia 12/12/2012    Priority: Medium    Fibromyalgia 12/28/2011    Priority: Medium    Hypothyroidism 04/06/2010    Priority: Medium    MAI (mycobacterium avium-intracellulare) (HCC) 11/19/2009    Priority: Medium    Hyperlipemia 07/03/2007    Priority: Medium    Essential hypertension 07/03/2007    Priority: Medium    Obstructive bronchiectasis (HCC) with GOLD II/III criteria 07/03/2007    Priority: Medium    Cystitis 11/05/2016    Priority: Low   Former smoker 08/05/2014    Priority: Low   Constipation 05/02/2014    Priority: Low    History of colonic polyps 05/02/2014    Priority: Low   GERD (gastroesophageal reflux disease) 02/24/2014    Priority: Low   Hemoptysis 02/24/2014    Priority: Low   Benign paroxysmal positional vertigo 04/18/2013    Priority: Low   Diverticulitis 12/21/2012    Priority: Low   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   Abnormal colonoscopy 12/05/2021   Hx of adenomatous colonic polyps 12/05/2021   Family history of colon cancer 12/05/2021   Cerebrovascular disease 01/31/2015   Past Surgical History:  Procedure Laterality Date   COLONOSCOPY WITH PROPOFOL N/A 01/21/2022   Procedure: COLONOSCOPY WITH PROPOFOL;  Surgeon: Lemar Lofty., MD;  Location: [facility];  Service: Gastroenterology;  Laterality: N/A;   CORONARY ARTERY BYPASS GRAFT  01/25/2002   x3 CABG   ENDOSCOPIC MUCOSAL RESECTION N/A 01/21/2022   Procedure: ENDOSCOPIC MUCOSAL RESECTION;  Surgeon: Meridee Score Netty Starring., MD;  Location: [facility];  Service: Gastroenterology;  Laterality: N/A;   HAND SURGERY     Dr. Amanda Pea sept 2022- improved hand movement   HEMOSTASIS CLIP PLACEMENT  01/21/2022   Procedure: HEMOSTASIS CLIP PLACEMENT;  Surgeon: Lemar Lofty., MD;  Location: [facility];  Service: Gastroenterology;;   HOT HEMOSTASIS N/A 01/21/2022   Procedure: HOT HEMOSTASIS (ARGON PLASMA COAGULATION/BICAP);  Surgeon: Lemar Lofty., MD;  Location: [facility];  Service: Gastroenterology;  Laterality: N/A;   POLYPECTOMY  01/21/2022   Procedure: POLYPECTOMY;  Surgeon: Meridee Score Netty Starring., MD;  Location:  [facility];  Service: Gastroenterology;;   SUBMUCOSAL LIFTING INJECTION  01/21/2022   Procedure: SUBMUCOSAL LIFTING INJECTION;  Surgeon: Lemar Lofty., MD;  Location: [facility];  Service: Gastroenterology;;    Family History  Problem Relation Age of Onset   Colon cancer Mother    Hyperlipidemia Mother    Hypertension Mother    Heart disease Mother    Stroke Mother     Diabetes Mother    Cancer Mother    Colon cancer Father    Arthritis Father    Hyperlipidemia Father    Hypertension Father    Heart disease Father    Stroke Father    Cancer Father    Vision loss Father    Colon cancer Maternal Grandmother    Cancer Maternal Grandmother    Atopy Neg Hx    Stomach cancer Neg Hx    Esophageal cancer Neg Hx    Pancreatic cancer Neg Hx    Inflammatory bowel disease Neg Hx    Liver disease Neg Hx    Rectal cancer Neg Hx     Medications- reviewed and updated Current Outpatient Medications  Medication Sig Dispense Refill   acetaminophen (TYLENOL) 650 MG CR tablet Take 650 mg by mouth every 8 (eight) hours as needed for pain.     albuterol (PROAIR HFA) 108 (90 Base) MCG/ACT inhaler Inhale 2 puffs into the lungs every 6 (six) hours as needed for wheezing or shortness of breath. 1 each 2   amitriptyline (ELAVIL) 50 MG tablet Take 1 tablet (50 mg total) by mouth at bedtime. 90 tablet 3   aspirin EC 81 MG tablet Take 81 mg by mouth daily.     atorvastatin (LIPITOR) 80 MG tablet TAKE 1 TABLET(80 MG) BY MOUTH DAILY 90 tablet 1   azithromycin (ZITHROMAX) 250 MG tablet TAKE 1 TABLET(250 MG TOTAL) BY MOUTH DAILY 30 tablet 5   calcium carbonate (OS-CAL - DOSED IN MG OF ELEMENTAL CALCIUM) 1250 (500 Ca) MG tablet Take 1 tablet by mouth daily.     cholecalciferol (VITAMIN D3) 25 MCG (1000 UNIT) tablet Take 1,000 Units by mouth daily.     Coenzyme Q10 (COQ10) 200 MG CAPS Take 200 mg by mouth daily.     cyanocobalamin (VITAMIN B12) 1000 MCG tablet Take 1,000 mcg by mouth daily.     Melatonin 5 MG TABS Take 5 mg by mouth at bedtime.     metFORMIN (GLUCOPHAGE) 500 MG tablet TAKE 1 TABLET(500 MG) BY MOUTH TWICE DAILY WITH A MEAL 180 tablet 3   mometasone-formoterol (DULERA) 200-5 MCG/ACT AERO INHALE 2 PUFFS INTO THE LUNGS EVERY MORNING AND EVERY NIGHT AT BEDTIME 13 g 5   Multiple Vitamin (MULTIVITAMIN) tablet Take 1 tablet by mouth daily.     nebivolol (BYSTOLIC) 2.5 MG  tablet TAKE 1 TABLET(2.5 MG) BY MOUTH DAILY 90 tablet 3   Respiratory Therapy Supplies (FLUTTER) DEVI Use as directed 1 each 0   telmisartan (MICARDIS) 80 MG tablet TAKE 1 TABLET(80 MG) BY MOUTH DAILY 90 tablet 3   ALPRAZolam (XANAX) 0.25 MG tablet TAKE 1 TABLET BY MOUTH AT BEDTIME AS NEEDED FOR SLEEP. May take very sparingly for daytime anxiety at least 8 hours from nighttime dose. Do not drive for 8 hours after taking 95 tablet 0   Dextromethorphan-guaiFENesin (MUCINEX DM MAXIMUM STRENGTH) 60-1200 MG TB12 Take 1 tablet by mouth daily. (Patient not taking: Reported on 06/30/2022)     ondansetron (ZOFRAN-ODT) 8 MG disintegrating tablet Dissolve 1 tablet under the  tongue 30 mins prior to drinking preparation for colonoscopy. (Patient not taking: Reported on 06/30/2022) 2 tablet 0   SYNTHROID 75 MCG tablet TAKE 1 TABLET(75 MCG) BY MOUTH DAILY BEFORE AND BREAKFAST 90 tablet 3   No current facility-administered medications for this visit.    Allergies-reviewed and updated Allergies  Allergen Reactions   Bactrim [Sulfamethoxazole-Trimethoprim] Hives, Itching and Other (See Comments)    Bruised like areas on body   Ciprofloxacin Other (See Comments)    Body aches   Codeine Nausea Only    REACTION: nausea   Erythromycin Nausea Only    REACTION: nausea   Levaquin [Levofloxacin] Other (See Comments)    REACTION: aches    Social History   Social History Narrative   Family: Single never married, no children, cat and rehabs turtles and tortoise   Went to [employer] in [place] and social work, some business courses at [employer], EMT for 6 years.    LIves alone. Completely independent.    Lives in retirement community.       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 Oropharynx normal Neck: no thyromegaly CV: RRR no murmurs rubs or gallops Lungs: CTAB no crackles, wheeze, rhonchi Abdomen: soft/nontender/nondistended/normal bowel sounds. No rebound or guarding.  Ext: no edema Skin: warm, dry Neuro: CN II-XII intact, sensation and reflexes normal throughout, 5/5 muscle strength in bilateral upper and lower extremities. Normal finger to nose. Normal rapid alternating movements. No pronator drift. Normal gait.    Diabetic Foot Exam - Simple   Simple Foot Form Diabetic Foot exam was performed with the following findings: Yes 06/30/2022 10:28 AM  Visual Inspection No deformities, no ulcerations, no other skin breakdown bilaterally: Yes Sensation Testing Intact to touch and monofilament testing bilaterally: Yes Pulse Check Posterior Tibialis and Dorsalis pulse intact bilaterally: Yes Comments Small callous base of 5th MTP      Assessment and Plan   77 y.o. female presenting for annual physical.  Health Maintenance counseling: 1. Anticipatory guidance: Patient counseled regarding regular dental exams -q6 months- may end up needing bridge, eye exams - yearly,  avoiding smoking and second hand smoke , limiting alcohol to 1 beverage per day-doesn't drink , no illicit drugs .   2. Risk factor reduction:  Advised patient of need for regular exercise and diet rich and fruits and vegetables to reduce risk of heart attack and stroke.  Exercise- regular gardening as her focus Diet/weight management-weight up 4 lbs in last year- at lower baseline weight this is healthy fo rher.  Wt Readings from Last 3 Encounters:  06/30/22 115 lb 6.4 oz (52.3 kg)  03/10/22 113 lb 3.2 oz (51.3 kg)  01/21/22 111 lb (50.3 kg)  3. Immunizations/screenings/ancillary studies- recommended annual COVID vaccine- if she preferred 6months with MAI and bronchiectasis- she could -RSV shot in fall  2023- reconsidering upcoming fall Immunization History  Administered Date(s) Administered   Fluad Quad(high Dose 65+) 10/28/2020, 10/26/2021   H1N1 01/02/2008   Influenza Split 10/14/2010, 10/07/2011   Influenza, High Dose Seasonal PF 11/05/2016, 10/28/2020   Influenza,inj,Quad PF,6+ Mos 10/02/2012, 10/16/2015,  11/07/2017, 10/05/2018, 10/30/2019   Influenza-Unspecified 09/25/2013, 10/15/2014, 11/07/2017   Moderna Covid-19 Vaccine Bivalent Booster 22yrs & up 11/14/2020   Moderna Sars-Covid-2 Vaccination 02/09/2019, 03/12/2019, 09/05/2019, 05/12/2020   PFIZER Comirnaty(Gray Top)Covid-19 Tri-Sucrose Vaccine 11/12/2021   PNEUMOCOCCAL CONJUGATE-20 08/27/2020   Pneumococcal Conjugate-13 08/14/2013   Pneumococcal Polysaccharide-23 10/29/2011   Respiratory Syncytial Virus Vaccine,Recomb Aduvanted(Arexvy) 10/27/2021   Td 05/15/2019   Zoster Recombinat (Shingrix) 06/25/2017, 09/30/2017   4. Cervical cancer screening- past age based screening recommendations -denies ever having abnormal Pap. Saw GYN 01/30/21 - was told no more paps- Dr. Cliffton Asters 5. Breast cancer screening-  breast exam with GYN and mammogram  with GYN-typically does with [facility]- did have 2 biopsies which were reassuring 6. Colon cancer screening -  colonoscopy with adenoma 01/13/22- 6-9 month repeat adenoma- she plans to do in the fall. With Dr. Meridee Score 7. Skin cancer screening- sees derm yearly. advised regular sunscreen use. Denies worrisome, changing, or new skin lesions.  8. Birth control/STD check- not sexually active 9. Osteoporosis screening at 65- see below 10. Smoking associated screening - former smoker- quit over 30 years ago- no regular screening  Status of chronic or acute concerns   #Frequent headaches S: started a month ago. Does not think vision related. Can start in neck and then be diffuse throughout the head. No nausea. Up to moderate level- tylenol helps. Sometimes happening daily but does resolve after tylenol-  usually within 20 minutes. No clear trigger when started. Some sinus irritation and allergies  -No facial or extremity weakness. No slurred words or trouble swallowing. no blurry vision or double vision. No paresthesias. No confusion or word finding difficulties.  A/P: new headache over age 33 but reassuring neurological exam thankfully and we discussed doing MRI of the brain- she would prefer to trial Flonase daily for next 2 weeks and see if any allergic component contributing- also there could be some rebound headaches from nearly daily use of tylenol so she will limit that to twice a week. If not substantially better in 2 weeks we agreed to order MRI brain - would need likely to do open MRI- severe anxiety with traditional MRI even with medicine  #Fibromyalgia/insomnia S: Compliant with amitriptyline 50 mg (had worsening symptoms on lower dose in past by prior PCP)-due to age, her pharmacist has mentioned coming off amitriptyline but she has been on since 1988 and prefers to continue.  Uses Tylenol as needed. -Uses Xanax to help with sleep.  Very difficult to fall asleep if she does not take this.  Fibromyalgia symptoms worsen with poor sleep A/P: doing reasonably well- still has pain but tolerable. Has to rest and take tylenol after gardening for instance -sleep has been ok and no falls/imbalance on the xanax   % Pulmonary-MAI and obstructive bronchiectasis-follows with Dr. Sherene Sires S: Patient follows with pulmonary clinic.  On regular Dulera.  Albuterol as needed.  Uses azithromycin daily A/P: doing well with yearly visits- continue current medications and pulmonary follow up    % #CAD status post CABG-follows with Dr. Meda Klinefelter S: Compliant with atorvastatin 80 mg and aspirin 81 mg.  LDL goal under 70 -no chest pain, stable shortness of breath from pulmonary disease -occasionally with movement gets some cramping in torso for 30 seconds- a few times a week Lab Results  Component  Value Date   CHOL 128 06/01/2021   HDL 71.70 06/01/2021   LDLCALC 43 06/01/2021   LDLDIRECT 51.0 11/28/2020   TRIG 67.0 06/01/2021   CHOLHDL 2 06/01/2021  A/P: coronary artery disease  - asymptomatic - continue current medications  Lipids at goal- continue current medications   -occasionally with movement gets some cramping in torso for 30 seconds- a few times a week even with coq10 - she may try 200 mg instead of 100 mg daily  #Hypertension S: Compliant with Bystolic 2.5 mg, telmisartan 80mt A/P: stable- continue current medicines    #Hypothyroidism S: Compliant with Synthroid 75 mcg Lab Results  Component Value Date   TSH 1.73 12/02/2021  A/P: hopefully stable- update tsh today. Continue current meds for now    #Diabetes-new diagnosis 12/02/2021 with A1c 6.6 already on metformin S: Medication:  Metformin 500 mg twice a day.may take extra half if has heavier meal A/P: hopefully stable- update a1c today. Continue current meds for now     #Osteopenia-technically osteoporosis since has been on Fosamax in the past S: Takes calcium and vitamin D alone and all areas were stable-"02/16/17 DEXA. -2.2 at R femur neck-23% 10 year risk and hip fracture risk 12%.".  Numbers improved by 0.1 on femur necks in 2021 A/P: time for updated DEXA at [facility]- ordered today    #Constipation- recently doing ok on milk of magnesium. In past Patient compliant with sparing Linzess- was costly  - also may have caused some left side pain  # hand arthritis- Dr. Amanda Pea had surgery in sept 2022 and sept 2023  on right- has planned potentially on the left as well- wants to hold off - wearing braces at times.    Recommended follow up: Return in about 6 months (around 12/30/2022) for followup or sooner if needed.Schedule b4 you leave. Future Appointments  Date Time Provider Department Center  07/12/2022  2:00 PM [facility] ANNUAL WELLNESS VISIT 1 [facility]  09/01/2022  9:30 AM Sherene Sires Charlaine Dalton, MD [facility] None    Lab/Order associations: fasting   ICD-10-CM   1. Preventative health care  Z00.00     2. Coronary artery disease due to lipid rich plaque  I25.10    I25.83     3. Diabetes mellitus type 2 in nonobese (HCC)  E11.9 Comprehensive metabolic panel    CBC with Differential/Platelet    Lipid panel    HgB A1c    Microalbumin / creatinine urine ratio    4. MAI (mycobacterium avium-intracellulare) (HCC)  A31.0     5. Obstructive bronchiectasis (HCC) with GOLD II/III criteria  J47.9     6. Essential hypertension  I10     7. Hypothyroidism, unspecified type  E03.9 TSH    8. Aortic atherosclerosis (HCC)  I70.0     9. Osteopenia of right hip  M85.851 DG Bone Density     Meds ordered this encounter  Medications   ALPRAZolam (XANAX) 0.25 MG tablet    Sig: TAKE 1 TABLET BY MOUTH AT BEDTIME AS NEEDED FOR SLEEP. May take very sparingly for daytime anxiety at least 8 hours from nighttime dose. Do not drive for 8 hours after taking    Dispense:  95 tablet    Refill:  0   SYNTHROID 75 MCG tablet    Sig: TAKE 1 TABLET(75 MCG) BY MOUTH DAILY BEFORE AND BREAKFAST    Dispense:  90 tablet    Refill:  3   Return precautions advised.  Tana Conch, MD

## 2022-06-30 NOTE — Patient Instructions (Addendum)
Please stop by lab before you go If you have mychart- we will send your results within 3 business days of [place] receiving them.  If you do not have mychart- we will call you about results within 5 business days of [place] receiving them.  *please also note that you will see labs on mychart as soon as they post. I will later go in and write notes on them- will say "notes from Dr. Durene Cal"   new headache over age 77 and we discussed doing MRI of the brain- she would prefer to trial Flonase daily for next 2 weeks and see if any allergic component contributing- also there could be some rebound headaches from nearly daily use of tylenol so she will limit that to twice a week. If not substantially better in 2 weeks we agreed to order MRI brain  We have placed a referral for you today for bone density with [facility]- call them if haven't heard within a week   Recommended follow up: Return in about 6 months (around 12/30/2022) for followup or sooner if needed.Schedule b4 you leave.

## 2022-07-01 LAB — MICROALBUMIN / CREATININE URINE RATIO
Creatinine,U: 22.4 mg/dL
Microalb Creat Ratio: 3.1 mg/g (ref 0.0–30.0)
Microalb, Ur: <0.7 mg/dL (ref 0.0–1.9)

## 2022-07-05 DIAGNOSIS — L820 Inflamed seborrheic keratosis: Secondary | ICD-10-CM | POA: Diagnosis not present

## 2022-07-05 DIAGNOSIS — L400 Psoriasis vulgaris: Secondary | ICD-10-CM | POA: Diagnosis not present

## 2022-07-05 DIAGNOSIS — D225 Melanocytic nevi of trunk: Secondary | ICD-10-CM | POA: Diagnosis not present

## 2022-07-05 DIAGNOSIS — L570 Actinic keratosis: Secondary | ICD-10-CM | POA: Diagnosis not present

## 2022-07-05 DIAGNOSIS — D692 Other nonthrombocytopenic purpura: Secondary | ICD-10-CM | POA: Diagnosis not present

## 2022-07-05 DIAGNOSIS — Z85828 Personal history of other malignant neoplasm of skin: Secondary | ICD-10-CM | POA: Diagnosis not present

## 2022-07-05 DIAGNOSIS — L718 Other rosacea: Secondary | ICD-10-CM | POA: Diagnosis not present

## 2022-07-05 DIAGNOSIS — L821 Other seborrheic keratosis: Secondary | ICD-10-CM | POA: Diagnosis not present

## 2022-07-05 DIAGNOSIS — L304 Erythema intertrigo: Secondary | ICD-10-CM | POA: Diagnosis not present

## 2022-07-12 ENCOUNTER — Ambulatory Visit (INDEPENDENT_AMBULATORY_CARE_PROVIDER_SITE_OTHER): Payer: Medicare Other

## 2022-07-12 VITALS — Wt 112.0 lb

## 2022-07-12 DIAGNOSIS — Z0000 Encounter for general adult medical examination without abnormal findings: Secondary | ICD-10-CM

## 2022-07-12 NOTE — Progress Notes (Signed)
I connected with  [name] on 07/12/22 by a audio enabled telemedicine application and verified that I am speaking with the correct person using two identifiers.  Patient Location: Home  Provider Location: Office/Clinic  I discussed the limitations of evaluation and management by telemedicine. The patient expressed understanding and agreed to proceed.   Subjective:   Belinda Day is a 77 y.o. female who presents for Medicare Annual (Subsequent) preventive examination.  Review of Systems     Cardiac Risk Factors include: advanced age (>45men, >33 women);hypertension;dyslipidemia;diabetes mellitus     Objective:    Today's Vitals   07/12/22 1404  Weight: 112 lb (50.8 kg)   Body mass index is 21.16 kg/m.     07/12/2022    2:13 PM 01/21/2022    9:16 AM 07/06/2021    2:22 PM 06/09/2020   11:57 AM 08/09/2016    3:24 PM 04/17/2015    8:29 PM 12/21/2012    8:14 PM  Advanced Directives  Does Patient Have a Medical Advance Directive? Yes Yes Yes Yes Yes Yes Patient has advance directive, copy not in chart  Type of Advance Directive Healthcare Power of [place];Living will Healthcare Power of [place];Living will Living will;Healthcare Power of [employer] Power of [employer] Power of [place];Living will Healthcare Power of [place];Living will Healthcare Power of [place];Living will  Does patient want to make changes to medical advance directive?     No - Patient declined No - Patient declined No  Copy of Healthcare Power of Attorney in Chart? No - copy requested No - copy requested No - copy requested No - copy requested No - copy requested No - copy requested Copy requested from family  Pre-existing out of facility DNR order (yellow form or pink MOST form)       No    Current Medications (verified) Outpatient Encounter Medications as of 07/12/2022  Medication Sig   acetaminophen (TYLENOL) 650 MG CR tablet Take 650 mg by mouth every 8 (eight) hours as  needed for pain.   albuterol (PROAIR HFA) 108 (90 Base) MCG/ACT inhaler Inhale 2 puffs into the lungs every 6 (six) hours as needed for wheezing or shortness of breath.   ALPRAZolam (XANAX) 0.25 MG tablet TAKE 1 TABLET BY MOUTH AT BEDTIME AS NEEDED FOR SLEEP. May take very sparingly for daytime anxiety at least 8 hours from nighttime dose. Do not drive for 8 hours after taking   amitriptyline (ELAVIL) 50 MG tablet Take 1 tablet (50 mg total) by mouth at bedtime.   aspirin EC 81 MG tablet Take 81 mg by mouth daily.   atorvastatin (LIPITOR) 80 MG tablet TAKE 1 TABLET(80 MG) BY MOUTH DAILY   azithromycin (ZITHROMAX) 250 MG tablet TAKE 1 TABLET(250 MG TOTAL) BY MOUTH DAILY   calcium carbonate (OS-CAL - DOSED IN MG OF ELEMENTAL CALCIUM) 1250 (500 Ca) MG tablet Take 1 tablet by mouth daily.   cholecalciferol (VITAMIN D3) 25 MCG (1000 UNIT) tablet Take 1,000 Units by mouth daily.   Coenzyme Q10 (COQ10) 200 MG CAPS Take 200 mg by mouth daily.   cyanocobalamin (VITAMIN B12) 1000 MCG tablet Take 1,000 mcg by mouth daily.   Dextromethorphan-guaiFENesin (MUCINEX DM MAXIMUM STRENGTH PO) Take by mouth daily.   Melatonin 5 MG TABS Take 5 mg by mouth at bedtime.   metFORMIN (GLUCOPHAGE) 500 MG tablet TAKE 1 TABLET(500 MG) BY MOUTH TWICE DAILY WITH A MEAL   mometasone-formoterol (DULERA) 200-5 MCG/ACT AERO INHALE 2 PUFFS INTO THE LUNGS EVERY  MORNING AND EVERY NIGHT AT BEDTIME   Multiple Vitamin (MULTIVITAMIN) tablet Take 1 tablet by mouth daily.   nebivolol (BYSTOLIC) 2.5 MG tablet TAKE 1 TABLET(2.5 MG) BY MOUTH DAILY   Respiratory Therapy Supplies (FLUTTER) DEVI Use as directed   SYNTHROID 75 MCG tablet TAKE 1 TABLET(75 MCG) BY MOUTH DAILY BEFORE AND BREAKFAST   telmisartan (MICARDIS) 80 MG tablet TAKE 1 TABLET(80 MG) BY MOUTH DAILY   [DISCONTINUED] Dextromethorphan-guaiFENesin (MUCINEX DM MAXIMUM STRENGTH) 60-1200 MG TB12 Take 1 tablet by mouth daily. (Patient not taking: Reported on 06/30/2022)   [DISCONTINUED]  ondansetron (ZOFRAN-ODT) 8 MG disintegrating tablet Dissolve 1 tablet under the tongue 30 mins prior to drinking preparation for colonoscopy. (Patient not taking: Reported on 06/30/2022)   No facility-administered encounter medications on file as of 07/12/2022.    Allergies (verified) Bactrim [sulfamethoxazole-trimethoprim], Ciprofloxacin, Codeine, Erythromycin, and Levaquin [levofloxacin]   History: Past Medical History:  Diagnosis Date   Bronchiectasis    oxygen at night in the past   CAD (coronary artery disease)    COPD (chronic obstructive pulmonary disease) (HCC) bronchiectasis   Diabetes mellitus without complication (HCC) pre-diabetes   Diverticulitis 01/26/2012   GERD (gastroesophageal reflux disease)    History of shingles 04/25/2012   HTN (hypertension)    Hyperlipidemia    Hypothyroidism    MAI (mycobacterium avium-intracellulare) (HCC)    Neuritis of upper extremity    Substance abuse (HCC)    Past Surgical History:  Procedure Laterality Date   COLONOSCOPY WITH PROPOFOL N/A 01/21/2022   Procedure: COLONOSCOPY WITH PROPOFOL;  Surgeon: Lemar Lofty., MD;  Location: [facility];  Service: Gastroenterology;  Laterality: N/A;   CORONARY ARTERY BYPASS GRAFT  01/25/2002   x3 CABG   ENDOSCOPIC MUCOSAL RESECTION N/A 01/21/2022   Procedure: ENDOSCOPIC MUCOSAL RESECTION;  Surgeon: Meridee Score Netty Starring., MD;  Location: [facility];  Service: Gastroenterology;  Laterality: N/A;   HAND SURGERY     Dr. Amanda Pea sept 2022- improved hand movement   HEMOSTASIS CLIP PLACEMENT  01/21/2022   Procedure: HEMOSTASIS CLIP PLACEMENT;  Surgeon: Lemar Lofty., MD;  Location: [facility];  Service: Gastroenterology;;   HOT HEMOSTASIS N/A 01/21/2022   Procedure: HOT HEMOSTASIS (ARGON PLASMA COAGULATION/BICAP);  Surgeon: Lemar Lofty., MD;  Location: [facility];  Service: Gastroenterology;  Laterality: N/A;   POLYPECTOMY  01/21/2022   Procedure: POLYPECTOMY;   Surgeon: Mansouraty, Netty Starring., MD;  Location: [facility];  Service: Gastroenterology;;   Sunnie Nielsen LIFTING INJECTION  01/21/2022   Procedure: SUBMUCOSAL LIFTING INJECTION;  Surgeon: Lemar Lofty., MD;  Location: [facility];  Service: Gastroenterology;;   Family History  Problem Relation Age of Onset   Colon cancer Mother    Hyperlipidemia Mother    Hypertension Mother    Heart disease Mother    Stroke Mother    Diabetes Mother    Cancer Mother    Colon cancer Father    Arthritis Father    Hyperlipidemia Father    Hypertension Father    Heart disease Father    Stroke Father    Cancer Father    Vision loss Father    Colon cancer Maternal Grandmother    Cancer Maternal Grandmother    Atopy Neg Hx    Stomach cancer Neg Hx    Esophageal cancer Neg Hx    Pancreatic cancer Neg Hx    Inflammatory bowel disease Neg Hx    Liver disease Neg Hx    Rectal cancer Neg Hx    Social History  Socioeconomic History   Marital status: Single    Spouse name: Not on file   Number of children: Not on file   Years of education: Not on file   Highest education level: Not on file  Occupational History   Occupation: Retired   Tobacco Use   Smoking status: Former    Packs/day: 1.00    Years: 25.00    Additional pack years: 0.00    Total pack years: 25.00    Types: Cigarettes    Quit date: 01/26/1980    Years since quitting: 42.4   Smokeless tobacco: Never  Substance and Sexual Activity   Alcohol use: No   Drug use: No   Sexual activity: Not Currently    Birth control/protection: None  Other Topics Concern   Not on file  Social History Narrative   Family: Single never married, no children, cat and rehabs turtles and tortoise   Went to [employer] in [place] and social work, some business courses at [employer], EMT for 6 years.    LIves alone. Completely independent.    Lives in retirement community.       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      Hobbies: kayaking, gardening- mows own lawn   Social Determinants of Health   Financial Resource Strain: Low Risk  (07/12/2022)   Overall Financial Resource Strain (CARDIA)    Difficulty of Paying Living Expenses: Not hard at all  Food Insecurity: No Food Insecurity (07/12/2022)   Hunger Vital Sign    Worried About Running Out of Food in the Last Year: Never true    Ran Out of Food in the Last Year: Never true  Transportation Needs: No Transportation Needs (07/12/2022)   PRAPARE - Administrator, Civil Service (Medical): No    Lack of Transportation (Non-Medical): No  Physical Activity: Sufficiently Active (07/12/2022)   Exercise Vital Sign    Days of Exercise per Week: 3 days    Minutes of Exercise per Session: 90 min  Stress: No Stress Concern Present (07/12/2022)   [employer] of Occupational Health - Occupational Stress Questionnaire    Feeling of Stress : Not at all  Social Connections: Socially Isolated (07/12/2022)   Social Connection and Isolation Panel [NHANES]    Frequency of Communication with Friends and Family: More than three times a week    Frequency of Social Gatherings with Friends and Family: More than three times a week    Attends Religious Services: Never    Database administrator or Organizations: No    Attends Engineer, structural: Never    Marital Status: Never married    Tobacco Counseling Counseling given: Not Answered   Clinical Intake:  Pre-visit preparation completed: Yes  Pain : No/denies pain     BMI - recorded: 21.16 Nutritional Status: BMI of 19-24  Normal Nutritional Risks: None Diabetes: Yes CBG done?: No Did pt. bring in CBG monitor from home?: No  How often do you need to have someone help you when you read instructions, pamphlets, or other written materials from your doctor or pharmacy?: 1 - Never  Diabetic?Nutrition Risk Assessment:  Has the patient had any N/V/D  within the last 2 months?  No  Does the patient have any non-healing wounds?  No  Has the patient had any unintentional weight loss or weight gain?  No   Diabetes:  Is the patient diabetic?  Yes  If diabetic, was  a CBG obtained today?  No  Did the patient bring in their glucometer from home?  No  How often do you monitor your CBG's? N/a.   Financial Strains and Diabetes Management:  Are you having any financial strains with the device, your supplies or your medication? No .  Does the patient want to be seen by Chronic Care Management for management of their diabetes?  No  Would the patient like to be referred to a Nutritionist or for Diabetic Management?  No   Diabetic Exams:  Diabetic Eye Exam: Completed 10/14/21 Diabetic Foot Exam: Completed 06/30/22   Interpreter Needed?: No  Information entered by :: Lanier Ensign, LPN   Activities of Daily Living    07/12/2022    2:14 PM  In your present state of health, do you have any difficulty performing the following activities:  Hearing? 0  Vision? 0  Difficulty concentrating or making decisions? 0  Walking or climbing stairs? 0  Dressing or bathing? 0  Doing errands, shopping? 0  Preparing Food and eating ? N  Using the Toilet? N  In the past six months, have you accidently leaked urine? N  Do you have problems with loss of bowel control? N  Managing your Medications? N  Managing your Finances? N  Housekeeping or managing your Housekeeping? N    Patient Care Team: Shelva Majestic, MD as PCP - General (Family Medicine) Jens Som Madolyn Frieze, MD as PCP - Cardiology (Cardiology) [facility] as Consulting Physician (Ophthalmology) Nyoka Cowden, MD as Consulting Physician (Pulmonary Disease) Dominica Severin, MD as Consulting Physician (Orthopedic Surgery) [place], Amy, MD as Consulting Physician (Dermatology) Erroll Luna, [facility] (Pharmacist)  Indicate any recent Medical Services you may have  received from other than [facility] providers in the past year (date may be approximate).     Assessment:   This is a routine wellness examination for Hawra.  Hearing/Vision screen Hearing Screening - Comments:: Pt denies any hearing issues  Vision Screening - Comments:: Pt [facility] with [facility] for annual eye exams   Dietary issues and exercise activities discussed: Current Exercise Habits: Home exercise routine, Type of exercise: Other - see comments (yard work per pt), Time (Minutes): > 60, Frequency (Times/Week): 3, Weekly Exercise (Minutes/Week): 0   Goals Addressed             This Visit's Progress    Patient Stated       Maintain weight and keep A1C down        Depression Screen    07/12/2022    2:11 PM 06/30/2022    9:24 AM 07/06/2021    2:21 PM 11/28/2020   10:48 AM 06/09/2020   11:56 AM 05/28/2020   12:54 PM 05/08/2019    9:24 AM  PHQ 2/9 Scores  PHQ - 2 Score 0 0 0 0 0 0 0  PHQ- 9 Score 0 0    0     Fall Risk    06/30/2022    9:23 AM 07/06/2021    2:23 PM 11/28/2020   10:48 AM 06/09/2020   11:58 AM 05/28/2020   12:54 PM  Fall Risk   Falls in the past year? 0 1 0 0 0  Number falls in past yr: 0 1 0 0 0  Injury with Fall? 0 1 0 0 0  Comment  bruised from falling into counter     Risk for fall due to : No Fall Risks Impaired vision  Impaired  vision   Follow up Falls evaluation completed Falls prevention discussed Falls evaluation completed Falls prevention discussed     FALL RISK PREVENTION PERTAINING TO THE HOME:  Any stairs in or around the home? No  If so, are there any without handrails? No  Home free of loose throw rugs in walkways, pet beds, electrical cords, etc? Yes  Adequate lighting in your home to reduce risk of falls? Yes   ASSISTIVE DEVICES UTILIZED TO PREVENT FALLS:  Life alert? No  Use of a cane, walker or w/c? No  Grab bars in the bathroom? Yes  Shower chair or bench in shower? Yes  Elevated toilet seat or a handicapped  toilet? No   TIMED UP AND GO:  Was the test performed? No .   Cognitive Function:        07/12/2022    2:16 PM 07/06/2021    2:25 PM 06/09/2020   12:00 PM 05/08/2019    9:23 AM  6CIT Screen  What Year? 0 points 0 points 0 points 0 points  What month? 0 points 0 points 0 points 0 points  What time? 0 points 0 points  0 points  Count back from 20 0 points 0 points 0 points 0 points  Months in reverse 0 points 0 points 0 points 0 points  Repeat phrase 0 points 0 points 0 points 0 points  Total Score 0 points 0 points  0 points    Immunizations Immunization History  Administered Date(s) Administered   Fluad Quad(high Dose 65+) 10/28/2020, 10/26/2021   H1N1 01/02/2008   Influenza Split 10/14/2010, 10/07/2011   Influenza, High Dose Seasonal PF 11/05/2016, 10/28/2020   Influenza,inj,Quad PF,6+ Mos 10/02/2012, 10/16/2015, 11/07/2017, 10/05/2018, 10/30/2019   Influenza-Unspecified 09/25/2013, 10/15/2014, 11/07/2017   Moderna Covid-19 Vaccine Bivalent Booster 76yrs & up 11/14/2020   Moderna Sars-Covid-2 Vaccination 02/09/2019, 03/12/2019, 09/05/2019, 05/12/2020   PFIZER Comirnaty(Gray Top)Covid-19 Tri-Sucrose Vaccine 11/12/2021   PNEUMOCOCCAL CONJUGATE-20 08/27/2020   Pneumococcal Conjugate-13 08/14/2013   Pneumococcal Polysaccharide-23 10/29/2011   Respiratory Syncytial Virus Vaccine,Recomb Aduvanted(Arexvy) 10/27/2021   Td 05/15/2019   Zoster Recombinat (Shingrix) 06/25/2017, 09/30/2017    TDAP status: Up to date  Flu Vaccine status: Up to date  Pneumococcal vaccine status: Up to date  Covid-19 vaccine status: Completed vaccines  Qualifies for Shingles Vaccine? Yes   Zostavax completed Yes   Shingrix Completed?: Yes  Screening Tests Health Maintenance  Topic Date Due   COVID-19 Vaccine (7 - 2023-24 season) 07/16/2022 (Originally 01/07/2022)   INFLUENZA VACCINE  08/26/2022   OPHTHALMOLOGY EXAM  10/15/2022   HEMOGLOBIN A1C  12/30/2022   MAMMOGRAM  03/20/2023    Diabetic kidney evaluation - eGFR measurement  06/30/2023   Diabetic kidney evaluation - Urine ACR  06/30/2023   FOOT EXAM  06/30/2023   Medicare Annual Wellness (AWV)  07/12/2023   Colonoscopy  01/21/2025   DTaP/Tdap/Td (2 - Tdap) 05/14/2029   Pneumonia Vaccine 31+ Years old  Completed   DEXA SCAN  Completed   Hepatitis C Screening  Completed   Zoster Vaccines- Shingrix  Completed   HPV VACCINES  Aged Out    Health Maintenance  There are no preventive care reminders to display for this patient.   Colorectal cancer screening: Type of screening: Colonoscopy. Completed 01/21/22. Repeat every 3 years  Mammogram status: Completed 03/19/22. Repeat every year  Bone Density status: Completed 03/12/19. Results reflect: Bone density results: OSTEOPENIA. Repeat every 03/12/19 years. Ordered 06/30/22   Additional Screening:  Hepatitis C Screening: Completed 01/29/15  Vision Screening: Recommended annual ophthalmology exams for early detection of glaucoma and other disorders of the eye. Is the patient up to date with their annual eye exam?  Yes  Who is the provider or what is the name of the office in which the patient attends annual eye exams? [facility]  If pt is not established with a provider, would they like to be referred to a provider to establish care? No .   Dental Screening: Recommended annual dental exams for proper oral hygiene  Community Resource Referral / Chronic Care Management: CRR required this visit?  No   CCM required this visit?  No      Plan:     I have personally reviewed and noted the following in the patient's chart:   Medical and social history Use of alcohol, tobacco or illicit drugs  Current medications and supplements including opioid prescriptions. Patient is not currently taking opioid prescriptions. Functional ability and status Nutritional status Physical activity Advanced directives List of other physicians Hospitalizations,  surgeries, and ER visits in previous 12 months Vitals Screenings to include cognitive, depression, and falls Referrals and appointments  In addition, I have reviewed and discussed with patient certain preventive protocols, quality metrics, and best practice recommendations. A written personalized care plan for preventive services as well as general preventive health recommendations were provided to patient.     Marzella Schlein, LPN   [id number]/61/0960   Nurse Notes: none

## 2022-07-12 NOTE — Patient Instructions (Signed)
Ms. Hauswirth , Thank you for taking time to come for your Medicare Wellness Visit. I appreciate your ongoing commitment to your health goals. Please review the following plan we discussed and let me know if I can assist you in the future.   These are the goals we discussed:  Goals      Goal weight 125 lb.     123-125 lb range.     Patient Stated     Keep weight steady and try to improve her diet More fruits and vegetables Try to add some fiber      Patient Stated     Maintain weight      Patient Stated     Maintain health     Patient Stated     Maintain weight and keep A1C down      PharmD Care Plan     CARE PLAN ENTRY (see longitudinal plan of care for additional care plan information)  Current Barriers:  Chronic Disease Management support, education, and care coordination needs related to Hypertension, Hyperlipidemia, and Diabetes   Hypertension BP Readings from Last 3 Encounters:  01/28/20 134/82  11/27/19 118/76  08/27/19 128/70  Pharmacist Clinical Goal(s): Over the next 365 days, patient will work with PharmD and providers to maintain BP goal <130/80 Current regimen:  Telmisartan 80 mg once daily  Bystolic 2.5 mg once daily Interventions: Reviewed patient assistance - agreed to move forward Reviewed diet and exercise - Maintain a healthy weight and exercise regularly, as directed by your health care provider. Eat healthy foods, such as: Lean proteins, complex carbohydrates, fresh fruits and vegetables, low-fat dairy products, healthy fats. Patient self care activities - Over the next 365 days, patient will: Check BP at least once every 1-2 weeks, document, and provide at future appointments Ensure daily salt intake < 2300 mg/day  Hyperlipidemia Lab Results  Component Value Date/Time   LDLCALC 57 05/08/2019 08:45 AM   LDLDIRECT 47 11/27/2019 09:50 AM  Pharmacist Clinical Goal(s): Over the next 365 days, patient will work with PharmD and providers to maintain  LDL goal < 70 Current regimen:  Atorvastatin 80 mg once daily Interventions: Reviewed side effects/tolerability - none noted at this time Patient self care activities - Over the next 365 days, patient will: Continue current management  Hyperglycemia / Prediabetes Lab Results  Component Value Date/Time   HGBA1C 5.9 (H) 11/27/2019 09:50 AM   HGBA1C 6.1 05/08/2019 08:45 AM  Pharmacist Clinical Goal(s): Over the next 365 days, patient will work with PharmD and providers to maintain A1c goal <6.5% Current regimen:  Metformin 500 mg twice daily with meals Interventions: Reviewed diet/exercise - see high blood pressure Patient self care activities - Over the next 365 days, patient will: Check blood sugar if directed, document, and provide at future appointments Contact provider with any episodes of hypoglycemia  Medication management Pharmacist Clinical Goal(s): Over the next 365 days, patient will work with PharmD and providers to maintain optimal medication adherence Ensure medication accessibility Current pharmacy: [facility] Interventions Comprehensive medication review performed. Continue current medication management strategy. Reviewed patient assistance for Bystolic and Maintenance inhaler - will move forward with application Patient self care activities - Over the next 365 days, patient will: Take medications as prescribed Report any questions or concerns to PharmD and/or provider(s) Initial goal documentation.     Track and Manage My Blood Pressure-Hypertension     Timeframe:  Long-Range Goal Priority:  High Start Date:    09/30/20  Expected End Date:  03/30/21                     Follow Up Date 12/30/20    - check blood pressure weekly - choose a place to take my blood pressure (home, clinic or office, retail store) - write blood pressure results in a log or diary    Why is this important?   You won't feel high blood pressure, but it can  still hurt your blood vessels.  High blood pressure can cause heart or kidney problems. It can also cause a stroke.  Making lifestyle changes like losing a little weight or eating less salt will help.  Checking your blood pressure at home and at different times of the day can help to control blood pressure.  If the doctor prescribes medicine remember to take it the way the doctor ordered.  Call the office if you cannot afford the medicine or if there are questions about it.     Notes:         This is a list of the screening recommended for you and due dates:  Health Maintenance  Topic Date Due   COVID-19 Vaccine (7 - 2023-24 season) 07/16/2022*   Flu Shot  08/26/2022   Eye exam for diabetics  10/15/2022   Hemoglobin A1C  12/30/2022   Mammogram  03/20/2023   Yearly kidney function blood test for diabetes  06/30/2023   Yearly kidney health urinalysis for diabetes  06/30/2023   Complete foot exam   06/30/2023   Medicare Annual Wellness Visit  07/12/2023   Colon Cancer Screening  01/21/2025   DTaP/Tdap/Td vaccine (2 - Tdap) 05/14/2029   Pneumonia Vaccine  Completed   DEXA scan (bone density measurement)  Completed   Hepatitis C Screening  Completed   Zoster (Shingles) Vaccine  Completed   HPV Vaccine  Aged Out  *Topic was postponed. The date shown is not the original due date.    Advanced directives: Please bring a copy of your health care power of attorney and living will to the office at your convenience.  Conditions/risks identified: keep A1c Down and maintain health   Next appointment: Follow up in one year for your annual wellness visit    Preventive Care 65 Years and Older, Female Preventive care refers to lifestyle choices and visits with your health care provider that can promote health and wellness. What does preventive care include? A yearly physical exam. This is also called an annual well check. Dental exams once or twice a year. Routine eye exams. Ask your  health care provider how often you should have your eyes checked. Personal lifestyle choices, including: Daily care of your teeth and gums. Regular physical activity. Eating a healthy diet. Avoiding tobacco and drug use. Limiting alcohol use. Practicing safe sex. Taking low-dose aspirin every day. Taking vitamin and mineral supplements as recommended by your health care provider. What happens during an annual well check? The services and screenings done by your health care provider during your annual well check will depend on your age, overall health, lifestyle risk factors, and family history of disease. Counseling  Your health care provider may ask you questions about your: Alcohol use. Tobacco use. Drug use. Emotional well-being. Home and relationship well-being. Sexual activity. Eating habits. History of falls. Memory and ability to understand (cognition). Work and work Astronomer. Reproductive health. Screening  You may have the following tests or measurements: Height, weight, and BMI. Blood pressure. Lipid and cholesterol  levels. These may be checked every 5 years, or more frequently if you are over 16 years old. Skin check. Lung cancer screening. You may have this screening every year starting at age 62 if you have a 30-pack-year history of smoking and currently smoke or have quit within the past 15 years. Fecal occult blood test (FOBT) of the stool. You may have this test every year starting at age 60. Flexible sigmoidoscopy or colonoscopy. You may have a sigmoidoscopy every 5 years or a colonoscopy every 10 years starting at age 3. Hepatitis C blood test. Hepatitis B blood test. Sexually transmitted disease (STD) testing. Diabetes screening. This is done by checking your blood sugar (glucose) after you have not eaten for a while (fasting). You may have this done every 1-3 years. Bone density scan. This is done to screen for osteoporosis. You may have this done  starting at age 73. Mammogram. This may be done every 1-2 years. Talk to your health care provider about how often you should have regular mammograms. Talk with your health care provider about your test results, treatment options, and if necessary, the need for more tests. Vaccines  Your health care provider may recommend certain vaccines, such as: Influenza vaccine. This is recommended every year. Tetanus, diphtheria, and acellular pertussis (Tdap, Td) vaccine. You may need a Td booster every 10 years. Zoster vaccine. You may need this after age 59. Pneumococcal 13-valent conjugate (PCV13) vaccine. One dose is recommended after age 37. Pneumococcal polysaccharide (PPSV23) vaccine. One dose is recommended after age 60. Talk to your health care provider about which screenings and vaccines you need and how often you need them. This information is not intended to replace advice given to you by your health care provider. Make sure you discuss any questions you have with your health care provider. Document Released: 02/07/2015 Document Revised: 10/01/2015 Document Reviewed: 11/12/2014 Elsevier Interactive Patient Education  2017 [employer].  Fall Prevention in the Home Falls can cause injuries. They can happen to people of all ages. There are many things you can do to make your home safe and to help prevent falls. What can I do on the outside of my home? Regularly fix the edges of walkways and driveways and fix any cracks. Remove anything that might make you trip as you walk through a door, such as a raised step or threshold. Trim any bushes or trees on the path to your home. Use bright outdoor lighting. Clear any walking paths of anything that might make someone trip, such as rocks or tools. Regularly check to see if handrails are loose or broken. Make sure that both sides of any steps have handrails. Any raised decks and porches should have guardrails on the edges. Have any leaves, snow, or  ice cleared regularly. Use sand or salt on walking paths during winter. Clean up any spills in your garage right away. This includes oil or grease spills. What can I do in the bathroom? Use night lights. Install grab bars by the toilet and in the tub and shower. Do not use towel bars as grab bars. Use non-skid mats or decals in the tub or shower. If you need to sit down in the shower, use a plastic, non-slip stool. Keep the floor dry. Clean up any water that spills on the floor as soon as it happens. Remove soap buildup in the tub or shower regularly. Attach bath mats securely with double-sided non-slip rug tape. Do not have throw rugs and other things  on the floor that can make you trip. What can I do in the bedroom? Use night lights. Make sure that you have a light by your bed that is easy to reach. Do not use any sheets or blankets that are too big for your bed. They should not hang down onto the floor. Have a firm chair that has side arms. You can use this for support while you get dressed. Do not have throw rugs and other things on the floor that can make you trip. What can I do in the kitchen? Clean up any spills right away. Avoid walking on wet floors. Keep items that you use a lot in easy-to-reach places. If you need to reach something above you, use a strong step stool that has a grab bar. Keep electrical cords out of the way. Do not use floor polish or wax that makes floors slippery. If you must use wax, use non-skid floor wax. Do not have throw rugs and other things on the floor that can make you trip. What can I do with my stairs? Do not leave any items on the stairs. Make sure that there are handrails on both sides of the stairs and use them. Fix handrails that are broken or loose. Make sure that handrails are as long as the stairways. Check any carpeting to make sure that it is firmly attached to the stairs. Fix any carpet that is loose or worn. Avoid having throw rugs at  the top or bottom of the stairs. If you do have throw rugs, attach them to the floor with carpet tape. Make sure that you have a light switch at the top of the stairs and the bottom of the stairs. If you do not have them, ask someone to add them for you. What else can I do to help prevent falls? Wear shoes that: Do not have high heels. Have rubber bottoms. Are comfortable and fit you well. Are closed at the toe. Do not wear sandals. If you use a stepladder: Make sure that it is fully opened. Do not climb a closed stepladder. Make sure that both sides of the stepladder are locked into place. Ask someone to hold it for you, if possible. Clearly mark and make sure that you can see: Any grab bars or handrails. First and last steps. Where the edge of each step is. Use tools that help you move around (mobility aids) if they are needed. These include: Canes. Walkers. Scooters. Crutches. Turn on the lights when you go into a dark area. Replace any light bulbs as soon as they burn out. Set up your furniture so you have a clear path. Avoid moving your furniture around. If any of your floors are uneven, fix them. If there are any pets around you, be aware of where they are. Review your medicines with your doctor. Some medicines can make you feel dizzy. This can increase your chance of falling. Ask your doctor what other things that you can do to help prevent falls. This information is not intended to replace advice given to you by your health care provider. Make sure you discuss any questions you have with your health care provider. Document Released: 11/07/2008 Document Revised: 06/19/2015 Document Reviewed: 02/15/2014 Elsevier Interactive Patient Education  2017 [employer].

## 2022-07-15 NOTE — Progress Notes (Signed)
I connected with  [name] on 07/15/22 by a audio enabled telemedicine application and verified that I am speaking with the correct person using two identifiers.  Patient Location: Home  Provider Location: Office/Clinic  I discussed the limitations of evaluation and management by telemedicine. The patient expressed understanding and agreed to proceed.   Subjective:   Belinda Day is a 77 y.o. female who presents for Medicare Annual (Subsequent) preventive examination.  Review of Systems     Cardiac Risk Factors include: advanced age (>11men, >64 women);hypertension;dyslipidemia;diabetes mellitus     Objective:    Today's Vitals   07/12/22 1404  Weight: 112 lb (50.8 kg)   Body mass index is 21.16 kg/m.     07/12/2022    2:13 PM 01/21/2022    9:16 AM 07/06/2021    2:22 PM 06/09/2020   11:57 AM 08/09/2016    3:24 PM 04/17/2015    8:29 PM 12/21/2012    8:14 PM  Advanced Directives  Does Patient Have a Medical Advance Directive? Yes Yes Yes Yes Yes Yes Patient has advance directive, copy not in chart  Type of Advance Directive Healthcare Power of [place];Living will Healthcare Power of [place];Living will Living will;Healthcare Power of [employer] Power of [employer] Power of [place];Living will Healthcare Power of [place];Living will Healthcare Power of [place];Living will  Does patient want to make changes to medical advance directive?     No - Patient declined No - Patient declined No  Copy of Healthcare Power of Attorney in Chart? No - copy requested No - copy requested No - copy requested No - copy requested No - copy requested No - copy requested Copy requested from family  Pre-existing out of facility DNR order (yellow form or pink MOST form)       No    Current Medications (verified) Outpatient Encounter Medications as of 07/12/2022  Medication Sig   acetaminophen (TYLENOL) 650 MG CR tablet Take 650 mg by mouth every 8 (eight) hours as  needed for pain.   albuterol (PROAIR HFA) 108 (90 Base) MCG/ACT inhaler Inhale 2 puffs into the lungs every 6 (six) hours as needed for wheezing or shortness of breath.   ALPRAZolam (XANAX) 0.25 MG tablet TAKE 1 TABLET BY MOUTH AT BEDTIME AS NEEDED FOR SLEEP. May take very sparingly for daytime anxiety at least 8 hours from nighttime dose. Do not drive for 8 hours after taking   amitriptyline (ELAVIL) 50 MG tablet Take 1 tablet (50 mg total) by mouth at bedtime.   aspirin EC 81 MG tablet Take 81 mg by mouth daily.   atorvastatin (LIPITOR) 80 MG tablet TAKE 1 TABLET(80 MG) BY MOUTH DAILY   azithromycin (ZITHROMAX) 250 MG tablet TAKE 1 TABLET(250 MG TOTAL) BY MOUTH DAILY   calcium carbonate (OS-CAL - DOSED IN MG OF ELEMENTAL CALCIUM) 1250 (500 Ca) MG tablet Take 1 tablet by mouth daily.   cholecalciferol (VITAMIN D3) 25 MCG (1000 UNIT) tablet Take 1,000 Units by mouth daily.   Coenzyme Q10 (COQ10) 200 MG CAPS Take 200 mg by mouth daily.   cyanocobalamin (VITAMIN B12) 1000 MCG tablet Take 1,000 mcg by mouth daily.   Dextromethorphan-guaiFENesin (MUCINEX DM MAXIMUM STRENGTH PO) Take by mouth daily.   Melatonin 5 MG TABS Take 5 mg by mouth at bedtime.   metFORMIN (GLUCOPHAGE) 500 MG tablet TAKE 1 TABLET(500 MG) BY MOUTH TWICE DAILY WITH A MEAL   mometasone-formoterol (DULERA) 200-5 MCG/ACT AERO INHALE 2 PUFFS INTO THE LUNGS EVERY  MORNING AND EVERY NIGHT AT BEDTIME   Multiple Vitamin (MULTIVITAMIN) tablet Take 1 tablet by mouth daily.   nebivolol (BYSTOLIC) 2.5 MG tablet TAKE 1 TABLET(2.5 MG) BY MOUTH DAILY   Respiratory Therapy Supplies (FLUTTER) DEVI Use as directed   SYNTHROID 75 MCG tablet TAKE 1 TABLET(75 MCG) BY MOUTH DAILY BEFORE AND BREAKFAST   telmisartan (MICARDIS) 80 MG tablet TAKE 1 TABLET(80 MG) BY MOUTH DAILY   [DISCONTINUED] Dextromethorphan-guaiFENesin (MUCINEX DM MAXIMUM STRENGTH) 60-1200 MG TB12 Take 1 tablet by mouth daily. (Patient not taking: Reported on 06/30/2022)   [DISCONTINUED]  ondansetron (ZOFRAN-ODT) 8 MG disintegrating tablet Dissolve 1 tablet under the tongue 30 mins prior to drinking preparation for colonoscopy. (Patient not taking: Reported on 06/30/2022)   No facility-administered encounter medications on file as of 07/12/2022.    Allergies (verified) Bactrim [sulfamethoxazole-trimethoprim], Ciprofloxacin, Codeine, Erythromycin, and Levaquin [levofloxacin]   History: Past Medical History:  Diagnosis Date   Bronchiectasis    oxygen at night in the past   CAD (coronary artery disease)    COPD (chronic obstructive pulmonary disease) (HCC) bronchiectasis   Diabetes mellitus without complication (HCC) pre-diabetes   Diverticulitis 01/26/2012   GERD (gastroesophageal reflux disease)    History of shingles 04/25/2012   HTN (hypertension)    Hyperlipidemia    Hypothyroidism    MAI (mycobacterium avium-intracellulare) (HCC)    Neuritis of upper extremity    Substance abuse (HCC)    Past Surgical History:  Procedure Laterality Date   COLONOSCOPY WITH PROPOFOL N/A 01/21/2022   Procedure: COLONOSCOPY WITH PROPOFOL;  Surgeon: Lemar Lofty., MD;  Location: [facility];  Service: Gastroenterology;  Laterality: N/A;   CORONARY ARTERY BYPASS GRAFT  01/25/2002   x3 CABG   ENDOSCOPIC MUCOSAL RESECTION N/A 01/21/2022   Procedure: ENDOSCOPIC MUCOSAL RESECTION;  Surgeon: Meridee Score Netty Starring., MD;  Location: [facility];  Service: Gastroenterology;  Laterality: N/A;   HAND SURGERY     Dr. Amanda Pea sept 2022- improved hand movement   HEMOSTASIS CLIP PLACEMENT  01/21/2022   Procedure: HEMOSTASIS CLIP PLACEMENT;  Surgeon: Lemar Lofty., MD;  Location: [facility];  Service: Gastroenterology;;   HOT HEMOSTASIS N/A 01/21/2022   Procedure: HOT HEMOSTASIS (ARGON PLASMA COAGULATION/BICAP);  Surgeon: Lemar Lofty., MD;  Location: [facility];  Service: Gastroenterology;  Laterality: N/A;   POLYPECTOMY  01/21/2022   Procedure: POLYPECTOMY;   Surgeon: Mansouraty, Netty Starring., MD;  Location: [facility];  Service: Gastroenterology;;   Sunnie Nielsen LIFTING INJECTION  01/21/2022   Procedure: SUBMUCOSAL LIFTING INJECTION;  Surgeon: Lemar Lofty., MD;  Location: [facility];  Service: Gastroenterology;;   Family History  Problem Relation Age of Onset   Colon cancer Mother    Hyperlipidemia Mother    Hypertension Mother    Heart disease Mother    Stroke Mother    Diabetes Mother    Cancer Mother    Colon cancer Father    Arthritis Father    Hyperlipidemia Father    Hypertension Father    Heart disease Father    Stroke Father    Cancer Father    Vision loss Father    Colon cancer Maternal Grandmother    Cancer Maternal Grandmother    Atopy Neg Hx    Stomach cancer Neg Hx    Esophageal cancer Neg Hx    Pancreatic cancer Neg Hx    Inflammatory bowel disease Neg Hx    Liver disease Neg Hx    Rectal cancer Neg Hx    Social History  Socioeconomic History   Marital status: Single    Spouse name: Not on file   Number of children: Not on file   Years of education: Not on file   Highest education level: Not on file  Occupational History   Occupation: Retired   Tobacco Use   Smoking status: Former    Packs/day: 1.00    Years: 25.00    Additional pack years: 0.00    Total pack years: 25.00    Types: Cigarettes    Quit date: 01/26/1980    Years since quitting: 42.4   Smokeless tobacco: Never  Substance and Sexual Activity   Alcohol use: No   Drug use: No   Sexual activity: Not Currently    Birth control/protection: None  Other Topics Concern   Not on file  Social History Narrative   Family: Single never married, no children, cat and rehabs turtles and tortoise   Went to [employer] in [place] and social work, some business courses at [employer], EMT for 6 years.    LIves alone. Completely independent.    Lives in retirement community.       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      Hobbies: kayaking, gardening- mows own lawn   Social Determinants of Health   Financial Resource Strain: Low Risk  (07/12/2022)   Overall Financial Resource Strain (CARDIA)    Difficulty of Paying Living Expenses: Not hard at all  Food Insecurity: No Food Insecurity (07/12/2022)   Hunger Vital Sign    Worried About Running Out of Food in the Last Year: Never true    Ran Out of Food in the Last Year: Never true  Transportation Needs: No Transportation Needs (07/12/2022)   PRAPARE - Administrator, Civil Service (Medical): No    Lack of Transportation (Non-Medical): No  Physical Activity: Sufficiently Active (07/12/2022)   Exercise Vital Sign    Days of Exercise per Week: 3 days    Minutes of Exercise per Session: 90 min  Stress: No Stress Concern Present (07/12/2022)   [employer] of Occupational Health - Occupational Stress Questionnaire    Feeling of Stress : Not at all  Social Connections: Socially Isolated (07/12/2022)   Social Connection and Isolation Panel [NHANES]    Frequency of Communication with Friends and Family: More than three times a week    Frequency of Social Gatherings with Friends and Family: More than three times a week    Attends Religious Services: Never    Database administrator or Organizations: No    Attends Engineer, structural: Never    Marital Status: Never married    Tobacco Counseling Counseling given: Not Answered   Clinical Intake:  Pre-visit preparation completed: Yes  Pain : No/denies pain     BMI - recorded: 21.16 Nutritional Status: BMI of 19-24  Normal Nutritional Risks: None Diabetes: Yes CBG done?: No Did pt. bring in CBG monitor from home?: No  How often do you need to have someone help you when you read instructions, pamphlets, or other written materials from your doctor or pharmacy?: 1 - Never  Diabetic?Nutrition Risk Assessment:  Has the patient had any N/V/D  within the last 2 months?  No  Does the patient have any non-healing wounds?  No  Has the patient had any unintentional weight loss or weight gain?  No   Diabetes:  Is the patient diabetic?  Yes  If diabetic, was  a CBG obtained today?  No  Did the patient bring in their glucometer from home?  No  How often do you monitor your CBG's? N/a.   Financial Strains and Diabetes Management:  Are you having any financial strains with the device, your supplies or your medication? No .  Does the patient want to be seen by Chronic Care Management for management of their diabetes?  No  Would the patient like to be referred to a Nutritionist or for Diabetic Management?  No   Diabetic Exams:  Diabetic Eye Exam: Completed 10/14/21 Diabetic Foot Exam: Completed 06/30/22   Interpreter Needed?: No  Information entered by :: Lanier Ensign, LPN   Activities of Daily Living    07/12/2022    2:14 PM  In your present state of health, do you have any difficulty performing the following activities:  Hearing? 0  Vision? 0  Difficulty concentrating or making decisions? 0  Walking or climbing stairs? 0  Dressing or bathing? 0  Doing errands, shopping? 0  Preparing Food and eating ? N  Using the Toilet? N  In the past six months, have you accidently leaked urine? N  Do you have problems with loss of bowel control? N  Managing your Medications? N  Managing your Finances? N  Housekeeping or managing your Housekeeping? N    Patient Care Team: Shelva Majestic, MD as PCP - General (Family Medicine) Jens Som Madolyn Frieze, MD as PCP - Cardiology (Cardiology) [facility], [facility] as Consulting Physician (Ophthalmology) Nyoka Cowden, MD as Consulting Physician (Pulmonary Disease) Dominica Severin, MD as Consulting Physician (Orthopedic Surgery) [place], Amy, MD as Consulting Physician (Dermatology) Erroll Luna, [facility] (Inactive) (Pharmacist)  Indicate any recent Medical Services you  may have received from other than [facility] providers in the past year (date may be approximate).     Assessment:   This is a routine wellness examination for Corey.  Hearing/Vision screen Hearing Screening - Comments:: Pt denies any hearing issues  Vision Screening - Comments:: Pt follows up with [facility] opthalmology for annual eye exams   Dietary issues and exercise activities discussed: Current Exercise Habits: Home exercise routine, Type of exercise: Other - see comments (yard work per pt), Time (Minutes): > 60, Frequency (Times/Week): 3, Weekly Exercise (Minutes/Week): 0   Goals Addressed             This Visit's Progress    Patient Stated       Maintain weight and keep A1C down       Depression Screen    07/12/2022    2:11 PM 06/30/2022    9:24 AM 07/06/2021    2:21 PM 11/28/2020   10:48 AM 06/09/2020   11:56 AM 05/28/2020   12:54 PM 05/08/2019    9:24 AM  PHQ 2/9 Scores  PHQ - 2 Score 0 0 0 0 0 0 0  PHQ- 9 Score 0 0    0     Fall Risk    07/15/2022    2:53 PM 06/30/2022    9:23 AM 07/06/2021    2:23 PM 11/28/2020   10:48 AM 06/09/2020   11:58 AM  Fall Risk   Falls in the past year? 0 0 1 0 0  Number falls in past yr: 0 0 1 0 0  Injury with Fall? 0 0 1 0 0  Comment   bruised from falling into counter    Risk for fall due to : Impaired vision No Fall Risks Impaired  vision  Impaired vision  Follow up Falls prevention discussed Falls evaluation completed Falls prevention discussed Falls evaluation completed Falls prevention discussed    FALL RISK PREVENTION PERTAINING TO THE HOME:  Any stairs in or around the home? No  If so, are there any without handrails? No  Home free of loose throw rugs in walkways, pet beds, electrical cords, etc? Yes  Adequate lighting in your home to reduce risk of falls? Yes   ASSISTIVE DEVICES UTILIZED TO PREVENT FALLS:  Life alert? No  Use of a cane, walker or w/c? No  Grab bars in the bathroom? Yes  Shower chair or bench in shower?  Yes  Elevated toilet seat or a handicapped toilet? No   TIMED UP AND GO:  Was the test performed? No .   Cognitive Function:        07/12/2022    2:16 PM 07/06/2021    2:25 PM 06/09/2020   12:00 PM 05/08/2019    9:23 AM  6CIT Screen  What Year? 0 points 0 points 0 points 0 points  What month? 0 points 0 points 0 points 0 points  What time? 0 points 0 points  0 points  Count back from 20 0 points 0 points 0 points 0 points  Months in reverse 0 points 0 points 0 points 0 points  Repeat phrase 0 points 0 points 0 points 0 points  Total Score 0 points 0 points  0 points    Immunizations Immunization History  Administered Date(s) Administered   Fluad Quad(high Dose 65+) 10/28/2020, 10/26/2021   H1N1 01/02/2008   Influenza Split 10/14/2010, 10/07/2011   Influenza, High Dose Seasonal PF 11/05/2016, 10/28/2020   Influenza,inj,Quad PF,6+ Mos 10/02/2012, 10/16/2015, 11/07/2017, 10/05/2018, 10/30/2019   Influenza-Unspecified 09/25/2013, 10/15/2014, 11/07/2017   Moderna Covid-19 Vaccine Bivalent Booster 53yrs & up 11/14/2020   Moderna Sars-Covid-2 Vaccination 02/09/2019, 03/12/2019, 09/05/2019, 05/12/2020   PFIZER Comirnaty(Gray Top)Covid-19 Tri-Sucrose Vaccine 11/12/2021   PNEUMOCOCCAL CONJUGATE-20 08/27/2020   Pneumococcal Conjugate-13 08/14/2013   Pneumococcal Polysaccharide-23 10/29/2011   Respiratory Syncytial Virus Vaccine,Recomb Aduvanted(Arexvy) 10/27/2021   Td 05/15/2019   Zoster Recombinat (Shingrix) 06/25/2017, 09/30/2017    TDAP status: Up to date  Flu Vaccine status: Up to date  Pneumococcal vaccine status: Up to date  Covid-19 vaccine status: Completed vaccines  Qualifies for Shingles Vaccine? Yes   Zostavax completed Yes   Shingrix Completed?: Yes  Screening Tests Health Maintenance  Topic Date Due   COVID-19 Vaccine (7 - 2023-24 season) 07/16/2022 (Originally 01/07/2022)   INFLUENZA VACCINE  08/26/2022   OPHTHALMOLOGY EXAM  10/15/2022   HEMOGLOBIN A1C   12/30/2022   MAMMOGRAM  03/20/2023   Diabetic kidney evaluation - eGFR measurement  06/30/2023   Diabetic kidney evaluation - Urine ACR  06/30/2023   FOOT EXAM  06/30/2023   Medicare Annual Wellness (AWV)  07/12/2023   Colonoscopy  01/21/2025   DTaP/Tdap/Td (2 - Tdap) 05/14/2029   Pneumonia Vaccine 66+ Years old  Completed   DEXA SCAN  Completed   Hepatitis C Screening  Completed   Zoster Vaccines- Shingrix  Completed   HPV VACCINES  Aged Out    Health Maintenance  There are no preventive care reminders to display for this patient.   Colorectal cancer screening: Type of screening: Colonoscopy. Completed 01/21/22. Repeat every 3 years  Mammogram status: Completed 03/19/22. Repeat every year  Bone Density status: Completed 03/12/19. Results reflect: Bone density results: OSTEOPENIA. Repeat every 03/12/19 years. Ordered 06/30/22   Additional Screening:  Hepatitis  C Screening: Completed 01/29/15  Vision Screening: Recommended annual ophthalmology exams for early detection of glaucoma and other disorders of the eye. Is the patient up to date with their annual eye exam?  Yes  Who is the provider or what is the name of the office in which the patient attends annual eye exams? [facility]  If pt is not established with a provider, would they like to be referred to a provider to establish care? No .   Dental Screening: Recommended annual dental exams for proper oral hygiene  Community Resource Referral / Chronic Care Management: CRR required this visit?  No   CCM required this visit?  No      Plan:     I have personally reviewed and noted the following in the patient's chart:   Medical and social history Use of alcohol, tobacco or illicit drugs  Current medications and supplements including opioid prescriptions. Patient is not currently taking opioid prescriptions. Functional ability and status Nutritional status Physical activity Advanced directives List of  other physicians Hospitalizations, surgeries, and ER visits in previous 12 months Vitals Screenings to include cognitive, depression, and falls Referrals and appointments  In addition, I have reviewed and discussed with patient certain preventive protocols, quality metrics, and best practice recommendations. A written personalized care plan for preventive services as well as general preventive health recommendations were provided to patient.     Marzella Schlein, LPN   9/56/2130   Nurse Notes: none

## 2022-08-09 DIAGNOSIS — M8588 Other specified disorders of bone density and structure, other site: Secondary | ICD-10-CM | POA: Diagnosis not present

## 2022-08-09 DIAGNOSIS — Z8262 Family history of osteoporosis: Secondary | ICD-10-CM | POA: Diagnosis not present

## 2022-08-09 LAB — HM DEXA SCAN

## 2022-08-26 ENCOUNTER — Other Ambulatory Visit: Payer: Self-pay | Admitting: Internal Medicine

## 2022-09-01 ENCOUNTER — Ambulatory Visit: Payer: Medicare Other | Admitting: Internal Medicine

## 2022-09-01 DIAGNOSIS — L089 Local infection of the skin and subcutaneous tissue, unspecified: Secondary | ICD-10-CM | POA: Diagnosis not present

## 2022-09-08 ENCOUNTER — Ambulatory Visit: Payer: Medicare Other | Admitting: Internal Medicine

## 2022-09-08 ENCOUNTER — Ambulatory Visit: Payer: Medicare Other

## 2022-09-08 ENCOUNTER — Encounter: Payer: Self-pay | Admitting: Internal Medicine

## 2022-09-08 VITALS — BP 124/74 | HR 82 | Temp 99.0°F | Ht 61.0 in | Wt 112.0 lb

## 2022-09-08 DIAGNOSIS — J479 Bronchiectasis, uncomplicated: Secondary | ICD-10-CM | POA: Diagnosis not present

## 2022-09-08 DIAGNOSIS — M79605 Pain in left leg: Secondary | ICD-10-CM | POA: Diagnosis not present

## 2022-09-08 DIAGNOSIS — R059 Cough, unspecified: Secondary | ICD-10-CM | POA: Diagnosis not present

## 2022-09-08 DIAGNOSIS — S8992XA Unspecified injury of left lower leg, initial encounter: Secondary | ICD-10-CM | POA: Diagnosis not present

## 2022-09-08 DIAGNOSIS — R918 Other nonspecific abnormal finding of lung field: Secondary | ICD-10-CM | POA: Diagnosis not present

## 2022-09-08 DIAGNOSIS — L089 Local infection of the skin and subcutaneous tissue, unspecified: Secondary | ICD-10-CM | POA: Diagnosis not present

## 2022-09-08 NOTE — Patient Instructions (Addendum)
Try prilosec otc 20mg   Take 30-60 min before first meal of the day and Pepcid ac (famotidine) 20 mg one @  bedtime until cough is completely gone for at least a week without the need for cough suppression  Please remember to go to the  x-ray department  for your tests - we will call you with the results when they are available    Please schedule a follow up office visit in 4 weeks, sooner if needed - bring inhaler

## 2022-09-08 NOTE — Progress Notes (Signed)
Subjective:    Patient ID: Belinda Day, female   DOB: 1945-04-18    MRN: [id number]   Brief patient profile:  77 yowf MM/ quit smoking 1972 with documented right middle lobe syndrome and evidence of bronchiectasis by CT scan in March 2002  And GOLD II criteria for copd 09/2010     History of Present Illness  08/08/07 FOB with classic cobblestoning and MAI on culture.   08/15/07 given Levaquin x 10 days with resolution bloody mucus, but "felt she had flu the whole time" with aches, feverish   August 29, 2007 ov: first post bronch co still coughing up mucus clear and initiate rx with symbicort/ [facility] STARTED   October 19, 2007 ov no cough , sob, feeling great but no improvement on cxr   December 07, 2007 ov feeling great, minimal am cough not productive. No sob.   Opth eval, labs ok 10/2007   September 06, 2008 ov overall better over the last year, less tendency to exac on zmax and ethambutol. rec complete another year > satisfied improved 90% and stopped zmax and eth 08/2009     05/16/17 rec zmax daily but did not do  11/14/2017  f/u ov/Ralonda Tartt re: obst bronchiectasis/ confused with details of care / taking zpak prn but also has omnicef and not sure what to do with it  Chief Complaint  Patient presents with   Follow-up    no current problems   Dyspnea:  yardwork ok / MMRC1 = can walk nl pace, flat grade, can't hurry or go uphills or steps s sob   Cough: variable esp in am / using flutter and vest prn / mucus beige and about a tbsp in am s heme Sleeping:  Bed flat and one big pilow  SABA use: rarely needed  rec Plan A = Automatic = symbicort 160  And take zmax daily  Plan B = Backup for  breathing Only use your albuterol as a rescue medication Plan B = for mucus if Nastier than nl especially assoc with any fever >  omnicef 300 mg twice daily x 7days Plan C = Call me if needed and A and B aren't working well   cxr on return     08/31/2021  f/u ov/Chin Wachter re:  obstructive bronchiectasis  maint on dulera  200/ zmax daily  Chief Complaint  Patient presents with   Follow-up    Breathing is unchanged and no new co's. She is using her albuterol inhaler 2-3 x per wk. She has noticed some difficulty swallowing recently.   Dyspnea:  yardwork when cool Cough: mainly in am  Sleeping: flat bed / one pillow SABA use: rare 02: none Covid status:   up to date Rec You will need covid update, RSV and flu shots this fall   Try dulera 100 Take 2 puffs first thing in am and then another 2 puffs about 12 hours later.        09/08/2022  yearly f/u ov/Dyana Magner re: bronchiectasis  maint on dulera 200 2bid  / daily zmax Chief Complaint  Patient presents with   Follow-up  Dyspnea:  no change / limited by L ankle pain p fell 3 weeks with laceration / just finished keflex 09/07/22 no better pain on wt bearing  or redness around wound but no fever /chills  Cough: p meals occ minimal pale green no change on keflex  Sleeping: level bed/ 2 pillows  SABA use: rarely hfa  02:  none      No obvious day to day or daytime variability or assoc  mucus plugs or hemoptysis or cp or chest tightness, subjective wheeze or overt sinus or hb symptoms.    Also denies any obvious fluctuation of symptoms with weather or environmental changes or other aggravating or alleviating factors except as outlined above   No unusual exposure hx or h/o childhood pna/ asthma or knowledge of premature birth.  Current Allergies, Complete Past Medical History, Past Surgical History, Family History, and Social History were reviewed in [employer] record.  ROS  The following are not active complaints unless bolded Hoarseness, sore throat, dysphagia, dental problems, itching, sneezing,  nasal congestion or discharge of excess mucus or purulent secretions, ear ache,   fever, chills, sweats, unintended wt loss or wt gain, classically pleuritic or exertional cp,  orthopnea pnd or  arm/hand swelling  or leg swelling, presyncope, palpitations, abdominal pain, anorexia, nausea, vomiting, diarrhea  or change in bowel habits or change in bladder habits, change in stools or change in urine, dysuria, hematuria,  rash, arthralgias, visual complaints, headache, numbness, weakness or ataxia or problems with walking/using cane  or coordination,  change in mood or  memory.        Current Meds  Medication Sig   acetaminophen (TYLENOL) 650 MG CR tablet Take 650 mg by mouth every 8 (eight) hours as needed for pain.   albuterol (PROAIR HFA) 108 (90 Base) MCG/ACT inhaler Inhale 2 puffs into the lungs every 6 (six) hours as needed for wheezing or shortness of breath.   ALPRAZolam (XANAX) 0.25 MG tablet TAKE 1 TABLET BY MOUTH AT BEDTIME AS NEEDED FOR SLEEP. May take very sparingly for daytime anxiety at least 8 hours from nighttime dose. Do not drive for 8 hours after taking   amitriptyline (ELAVIL) 50 MG tablet Take 1 tablet (50 mg total) by mouth at bedtime.   aspirin EC 81 MG tablet Take 81 mg by mouth daily.   atorvastatin (LIPITOR) 80 MG tablet TAKE 1 TABLET(80 MG) BY MOUTH DAILY   azithromycin (ZITHROMAX) 250 MG tablet TAKE 1 TABLET BY MOUTH DAILY   calcium carbonate (OS-CAL - DOSED IN MG OF ELEMENTAL CALCIUM) 1250 (500 Ca) MG tablet Take 1 tablet by mouth daily.   cholecalciferol (VITAMIN D3) 25 MCG (1000 UNIT) tablet Take 1,000 Units by mouth daily.   Coenzyme Q10 (COQ10) 200 MG CAPS Take 200 mg by mouth daily.   cyanocobalamin (VITAMIN B12) 1000 MCG tablet Take 1,000 mcg by mouth daily.   Dextromethorphan-guaiFENesin (MUCINEX DM MAXIMUM STRENGTH PO) Take by mouth daily.   Melatonin 5 MG TABS Take 5 mg by mouth at bedtime.   metFORMIN (GLUCOPHAGE) 500 MG tablet TAKE 1 TABLET(500 MG) BY MOUTH TWICE DAILY WITH A MEAL   mometasone-formoterol (DULERA) 200-5 MCG/ACT AERO INHALE 2 PUFFS INTO THE LUNGS EVERY MORNING AND EVERY NIGHT AT BEDTIME   Multiple Vitamin (MULTIVITAMIN) tablet Take 1  tablet by mouth daily.   nebivolol (BYSTOLIC) 2.5 MG tablet TAKE 1 TABLET(2.5 MG) BY MOUTH DAILY   Respiratory Therapy Supplies (FLUTTER) DEVI Use as directed   SYNTHROID 75 MCG tablet TAKE 1 TABLET(75 MCG) BY MOUTH DAILY BEFORE AND BREAKFAST   telmisartan (MICARDIS) 80 MG tablet TAKE 1 TABLET(80 MG) BY MOUTH DAILY                    Past Medical History:  Bronchiectasis see CT SE 04/13/00  - HFA 75% November 19, 2009  -  alpha one screen 01/14/2015 >  MM  - IgE 01/14/2015 = 11  MAI  - Rx Zmax and ETH 08/29/07 > 08/2009 restarted empirically 05/31/14 > 09/03/14 (no change in cough so just use zpak for flares)  - Rx zmax maint  10/16/2015 >>> d/c 08/03/2016 > restarted 05/16/17 and clinically improved 08/02/2017 using prn omnicef to supplment  - Eye eval   10/09.......................[name]  - Intol of levaquin so try cycles of cipro April 02, 2010  HEALTH MAINTENANCE...........................[name] - Td 10/2007  - Pneumovax 2005   and 10/29/2011 age 71, prevnar 08/16/2013  CAD  Hyperlipidemia  Hypertension  History of cough with ACE inhibition.  Gastroesophageal reflux disease         Objective:   Physical Exam  Wts  09/08/2022  112  08/31/2021    114 08/25/2020    110    02/26/2020   116 08/27/2019    119 02/16/2019  124 08/15/2018  123  Wt 130 October 19, 2007>140 March 31, 2010 > 127 07/16/2010 > 10/14/2010  120 > 05/04/2011  117 > 07/30/2011  120 > 10/29/2011 118 > 130  05/01/2012 > 12/12/2012 132 >  08/14/13 137 >    02/20/2014  137 >  05/31/2014 134 > 09/03/2014    140 > 10/15/2014 137 > 01/14/2015 137 >  05/15/2015 123 >08/14/2015  124 >  10/16/2015 126 > 03/02/2016   124  > 05/04/2016  126 > 08/03/2016   130 > 02/14/2017  126 > 05/16/2017 128 > 08/02/2017  119 > 11/14/2017  116      Vital signs reviewed  09/08/2022  - Note at rest 02 sats  96% on RA   General appearance:    amb (with cane)  elderly wf nad    HEENT :  Oropharynx  clear     NECK :  without JVD/Nodes/TM/ nl carotid upstrokes  bilaterally  LUNGS: no acc muscle use,  Mod barrel  contour chest wall with bilateral  Distant exp sonorous rhonchi  and  without cough on insp or exp maneuvers and mod  Hyperresonant  to  percussion bilaterally     CV:  RRR  no s3 or murmur or increase in P2, and no edema   ABD:  soft and nontender with pos mid insp Hoover's  in the supine position. No bruits or organomegaly appreciated, bowel sounds nl  MS:   Ext warm without deformities or obvious joint restrictions , calf tenderness, cyanosis or clubbing   SKIN: warm and dry with L leg quite erytyematous above and below jagged excoriation of flap of skin @  L medial mid calf   NEURO:  alert, approp, nl sensorium with  no motor or cerebellar deficits apparent.          CXR PA and Lateral:   09/08/2022 :    I personally reviewed images and impression is as follows:     C/w bronchiectasis/ scarring, no change from piors       Assessment:

## 2022-09-08 NOTE — Assessment & Plan Note (Addendum)
Onset of symptoms around 2002     - PFT's 10/14/2010  FEV1  1.25 (68%) and ratio 58% and DLCO 90%     - PFT's 10/29/2011  FEV1  1.33 (74%) and ratio 57 % and DLCO 93%    - PFTs 08/14/2013   FEV1  1.16 (60%) and ratio 61 with dlco 83%     - Flutter valve added 09/03/14      - alpha one   01/14/2015 >  MM, level 147     - IgE  01/14/15  11 - CT chest 04/17/15 Marked chronic bronchiectasis and volume loss in the right middle lobe with milder bronchiectasis, bronchial wall thickening, and nodular densities throughout the right upper and right lower lobe suggestive of chr onic endobronchial/atypical mycobacterial infection. - 10/16/2015 changed to symbicort 80 2bid (? Higher doses contributing to w MAI /freq of infections)   - 03/02/2016  After extensive coaching HFA effectiveness =    90%  - 05/04/2016 VEST stared around May 25 2016 - try off zmax 08/03/2016 > no change clinically as of 11/05/2016  - 02/14/2017 cycles of omnicef x 7 days prn purulent sputum  PFT's  05/16/2017  FEV1 0.89 (47 % ) ratio 60  p 12 % improvement from saba p nothing prior to study   - 05/16/2017  After extensive coaching inhaler device  effectiveness =    90%  - 05/16/17 quant Ig's ok x M slt low (not acutely ill at the time)  -zmax daily restarted 05/16/17 and clinically improved 08/02/2017 using prn omnicef to supplment but did not continue zmax as rec  - restart zmax daily 11/14/2017 > improved 02/14/2018  - 08/25/2020  After extensive coaching inhaler device,  effectiveness =    95%  > continue symbicort 160 and prn saba - 08/31/2021 rec try dulera 100 to reduce irritation upper airway and increase immune response to potential infections > worse breathing so increased back to 200  bid   Not doing as well since fell with ? Wound infection LLL so rec she return to UC asap to have this addressed and I'll see her back to regroup in 4 weeks with inhalers in hand using the trust but verify approach to med reconciliation          Each  maintenance medication was reviewed in detail including emphasizing most importantly the difference between maintenance and prns and under what circumstances the prns are to be triggered using an action plan format where appropriate.  Total time for H and P, chart review, counseling, reviewing hfa  device(s) and generating customized AVS unique to this office visit / same day charting = 25 min

## 2022-09-10 ENCOUNTER — Encounter: Payer: Self-pay | Admitting: Family Medicine

## 2022-09-17 ENCOUNTER — Telehealth: Payer: Self-pay | Admitting: Internal Medicine

## 2022-09-17 NOTE — Telephone Encounter (Signed)
Pt calling for results

## 2022-09-22 DIAGNOSIS — S81802D Unspecified open wound, left lower leg, subsequent encounter: Secondary | ICD-10-CM | POA: Diagnosis not present

## 2022-09-22 DIAGNOSIS — M79605 Pain in left leg: Secondary | ICD-10-CM | POA: Diagnosis not present

## 2022-09-22 DIAGNOSIS — L089 Local infection of the skin and subcutaneous tissue, unspecified: Secondary | ICD-10-CM | POA: Diagnosis not present

## 2022-09-22 DIAGNOSIS — S8992XA Unspecified injury of left lower leg, initial encounter: Secondary | ICD-10-CM | POA: Diagnosis not present

## 2022-09-24 NOTE — Telephone Encounter (Signed)
Left message for patient to call back if she has any other needs.  Looks like this may be an old message.

## 2022-10-01 ENCOUNTER — Other Ambulatory Visit: Payer: Self-pay

## 2022-10-01 ENCOUNTER — Encounter (HOSPITAL_BASED_OUTPATIENT_CLINIC_OR_DEPARTMENT_OTHER): Payer: Self-pay | Admitting: Emergency Medicine

## 2022-10-01 ENCOUNTER — Emergency Department (HOSPITAL_BASED_OUTPATIENT_CLINIC_OR_DEPARTMENT_OTHER): Payer: Medicare Other

## 2022-10-01 ENCOUNTER — Emergency Department (HOSPITAL_BASED_OUTPATIENT_CLINIC_OR_DEPARTMENT_OTHER)
Admission: EM | Admit: 2022-10-01 | Discharge: 2022-10-01 | Disposition: A | Discharge status: Home / Self Care | Payer: Medicare Other | Attending: Emergency Medicine | Admitting: Emergency Medicine

## 2022-10-01 DIAGNOSIS — Z7982 Long term (current) use of aspirin: Secondary | ICD-10-CM | POA: Insufficient documentation

## 2022-10-01 DIAGNOSIS — M79662 Pain in left lower leg: Secondary | ICD-10-CM | POA: Diagnosis not present

## 2022-10-01 DIAGNOSIS — L03116 Cellulitis of left lower limb: Secondary | ICD-10-CM | POA: Insufficient documentation

## 2022-10-01 DIAGNOSIS — I2510 Atherosclerotic heart disease of native coronary artery without angina pectoris: Secondary | ICD-10-CM | POA: Insufficient documentation

## 2022-10-01 DIAGNOSIS — Z79899 Other long term (current) drug therapy: Secondary | ICD-10-CM | POA: Insufficient documentation

## 2022-10-01 DIAGNOSIS — Z4800 Encounter for change or removal of nonsurgical wound dressing: Secondary | ICD-10-CM | POA: Diagnosis present

## 2022-10-01 DIAGNOSIS — E119 Type 2 diabetes mellitus without complications: Secondary | ICD-10-CM | POA: Diagnosis not present

## 2022-10-01 DIAGNOSIS — I10 Essential (primary) hypertension: Secondary | ICD-10-CM | POA: Insufficient documentation

## 2022-10-01 DIAGNOSIS — M79605 Pain in left leg: Secondary | ICD-10-CM | POA: Diagnosis not present

## 2022-10-01 DIAGNOSIS — Z7984 Long term (current) use of oral hypoglycemic drugs: Secondary | ICD-10-CM | POA: Insufficient documentation

## 2022-10-01 DIAGNOSIS — M85862 Other specified disorders of bone density and structure, left lower leg: Secondary | ICD-10-CM | POA: Diagnosis not present

## 2022-10-01 DIAGNOSIS — S8992XA Unspecified injury of left lower leg, initial encounter: Secondary | ICD-10-CM | POA: Diagnosis not present

## 2022-10-01 LAB — COMPREHENSIVE METABOLIC PANEL
ALT: 19 U/L (ref 0–44)
AST: 28 U/L (ref 15–41)
Albumin: 4.5 g/dL (ref 3.5–5.0)
Alkaline Phosphatase: 81 U/L (ref 38–126)
Anion gap: 8 (ref 5–15)
BUN: 16 mg/dL (ref 8–23)
CO2: 31 mmol/L (ref 22–32)
Calcium: 9.8 mg/dL (ref 8.9–10.3)
Chloride: 100 mmol/L (ref 98–111)
Creatinine, Ser: 0.81 mg/dL (ref 0.44–1.00)
GFR, Estimated: >60 mL/min (ref >60)
Glucose, Bld: 134 mg/dL — ABNORMAL HIGH (ref 70–99)
Potassium: 4.2 mmol/L (ref 3.5–5.1)
Sodium: 139 mmol/L (ref 135–145)
Total Bilirubin: 0.4 mg/dL (ref 0.3–1.2)
Total Protein: 7 g/dL (ref 6.5–8.1)

## 2022-10-01 LAB — CBC WITH DIFFERENTIAL/PLATELET
Abs Immature Granulocytes: 0.02 10*3/uL (ref 0.00–0.07)
Basophils Absolute: 0 10*3/uL (ref 0.0–0.1)
Basophils Relative: 1 %
Eosinophils Absolute: 0 10*3/uL (ref 0.0–0.5)
Eosinophils Relative: 1 %
HCT: 42.6 % (ref 36.0–46.0)
Hemoglobin: 14 g/dL (ref 12.0–15.0)
Immature Granulocytes: 0 %
Lymphocytes Relative: 20 %
Lymphs Abs: 1.2 10*3/uL (ref 0.7–4.0)
MCH: 31.9 pg (ref 26.0–34.0)
MCHC: 32.9 g/dL (ref 30.0–36.0)
MCV: 97 fL (ref 80.0–100.0)
Monocytes Absolute: 0.4 10*3/uL (ref 0.1–1.0)
Monocytes Relative: 6 %
Neutro Abs: 4.5 10*3/uL (ref 1.7–7.7)
Neutrophils Relative %: 72 %
Platelets: 281 10*3/uL (ref 150–400)
RBC: 4.39 MIL/uL (ref 3.87–5.11)
RDW: 12.2 % (ref 11.5–15.5)
WBC: 6.2 10*3/uL (ref 4.0–10.5)
nRBC: 0 % (ref 0.0–0.2)

## 2022-10-01 MED ORDER — LINEZOLID 600 MG PO TABS: 600.0000 mg | ORAL_TABLET | Freq: Two times a day (BID) | ORAL | 0 refills | Status: DC

## 2022-10-01 MED ORDER — VANCOMYCIN HCL IN DEXTROSE 1-5 GM/200ML-% IV SOLN
1000.0000 mg | Freq: Once | INTRAVENOUS | Status: AC
  Administered 2022-10-01: 1000 mg via INTRAVENOUS
  Filled 2022-10-01: qty 200

## 2022-10-01 NOTE — ED Triage Notes (Signed)
Wound on left ankle/lower leg x 1 month Seen by [facility] medical team, on abt.cipro and doxy.  Increased pain and redness around wound area. Sent for probable IV abt

## 2022-10-01 NOTE — Discharge Instructions (Signed)
Please start taking linezolid antibiotic as treatment of your skin infection.  This medication can sometimes interact with your amitriptyline therefore please hold off on taking amitriptyline at nighttime while taking antibiotic to decrease risks of drug-drug interaction.  Follow up with your doctor for further care.

## 2022-10-01 NOTE — ED Provider Notes (Signed)
[place] EMERGENCY DEPARTMENT AT [facility] Provider Note   CSN: [id number] Arrival date & time: 10/01/22  1337     History  Chief Complaint  Patient presents with   Wound Check    Belinda Day is a 77 y.o. female.  The history is provided by the patient and medical records. No language interpreter was used.  Wound Check     77 year old female significant history of CAD, hypertension, fibromyalgia, diabetes who was sent here by PCP office with concerns of nonhealing wound.  A month ago patient suffered a cut to her left lower extremity.  She was initially evaluated by her primary care provider and was started on Keflex.  She finished the course, without any improvement.  And she was subsequently seen again by her doctor and had an x-ray done at the area and her medication was switched to doxycycline.  Despite finish a round of doxycycline, wound still persist and she had a wound culture which is sensitive for doxycycline.  Patient was placed on another 10-day course of doxycycline.  She finished 7 days of the medication and noticed some improvement but still having pain and tenderness.  She is subsequently was seen evaluate by her doctor once again, and she had an x-ray of her left lower extremity that is negative for acute fracture and she also had an ultrasound of her left lower extremity that is normal.  Given her lack of resolution of her infection, after being on antibiotic for multiple rounds, patient was sent to the ER for further care.  Home Medications Prior to Admission medications   Medication Sig Start Date End Date Taking? Authorizing Provider  acetaminophen (TYLENOL) 650 MG CR tablet Take 650 mg by mouth every 8 (eight) hours as needed for pain.    [provider]  albuterol (PROAIR HFA) 108 (90 Base) MCG/ACT inhaler Inhale 2 puffs into the lungs every 6 (six) hours as needed for wheezing or shortness of breath. 08/31/21   Nyoka Cowden, MD   ALPRAZolam (XANAX) 0.25 MG tablet TAKE 1 TABLET BY MOUTH AT BEDTIME AS NEEDED FOR SLEEP. May take very sparingly for daytime anxiety at least 8 hours from nighttime dose. Do not drive for 8 hours after taking 06/30/22   Shelva Majestic, MD  amitriptyline (ELAVIL) 50 MG tablet Take 1 tablet (50 mg total) by mouth at bedtime. 05/27/22   Shelva Majestic, MD  aspirin EC 81 MG tablet Take 81 mg by mouth daily.    [provider]  atorvastatin (LIPITOR) 80 MG tablet TAKE 1 TABLET(80 MG) BY MOUTH DAILY 03/10/22   Cannon Kettle, PA-C  azithromycin (ZITHROMAX) 250 MG tablet TAKE 1 TABLET BY MOUTH DAILY 08/26/22   Nyoka Cowden, MD  calcium carbonate (OS-CAL - DOSED IN MG OF ELEMENTAL CALCIUM) 1250 (500 Ca) MG tablet Take 1 tablet by mouth daily.    [provider]  cholecalciferol (VITAMIN D3) 25 MCG (1000 UNIT) tablet Take 1,000 Units by mouth daily.    [provider]  Coenzyme Q10 (COQ10) 200 MG CAPS Take 200 mg by mouth daily.    [provider]  cyanocobalamin (VITAMIN B12) 1000 MCG tablet Take 1,000 mcg by mouth daily.    [provider]  Dextromethorphan-guaiFENesin (MUCINEX DM MAXIMUM STRENGTH PO) Take by mouth daily.    [provider]  Melatonin 5 MG TABS Take 5 mg by mouth at bedtime.    [provider]  metFORMIN (GLUCOPHAGE) 500 MG  tablet TAKE 1 TABLET(500 MG) BY MOUTH TWICE DAILY WITH A MEAL 05/27/22   Shelva Majestic, MD  mometasone-formoterol [facility]) 200-5 MCG/ACT AERO INHALE 2 PUFFS INTO THE LUNGS EVERY MORNING AND EVERY NIGHT AT BEDTIME 05/05/22   Nyoka Cowden, MD  Multiple Vitamin (MULTIVITAMIN) tablet Take 1 tablet by mouth daily.    [provider]  nebivolol (BYSTOLIC) 2.5 MG tablet TAKE 1 TABLET(2.5 MG) BY MOUTH DAILY 10/26/21   Lewayne Bunting, MD  Respiratory Therapy Supplies (FLUTTER) DEVI Use as directed 09/03/14   Nyoka Cowden, MD  SYNTHROID 75 MCG tablet TAKE 1 TABLET(75 MCG) BY MOUTH DAILY BEFORE  AND BREAKFAST 06/30/22   Shelva Majestic, MD  telmisartan (MICARDIS) 80 MG tablet TAKE 1 TABLET(80 MG) BY MOUTH DAILY 11/16/21   Lewayne Bunting, MD      Allergies    Bactrim [sulfamethoxazole-trimethoprim], Ciprofloxacin, Codeine, Erythromycin, and Levaquin [levofloxacin]    Review of Systems   Review of Systems  All other systems reviewed and are negative.   Physical Exam Updated Vital Signs BP (!) 154/79   Pulse 91   Temp 98 F (36.7 C) (Oral)   Resp 18   SpO2 95%  Physical Exam Vitals and nursing note reviewed.  Constitutional:      General: She is not in acute distress.    Appearance: She is well-developed.  HENT:     Head: Atraumatic.  Eyes:     Conjunctiva/sclera: Conjunctivae normal.  Cardiovascular:     Rate and Rhythm: Normal rate and regular rhythm.     Pulses: Normal pulses.     Heart sounds: Normal heart sounds.  Pulmonary:     Effort: Pulmonary effort is normal.  Abdominal:     Palpations: Abdomen is soft.     Tenderness: There is no abdominal tenderness.  Musculoskeletal:        General: Signs of injury (Left lower extremity: Patient has a scab wound noted to the anterior tib-fib region with surrounding skin erythema approximately 5 cm in diameter and it is tender to palpation but no fluctuance appreciated.  No significant warmth.  Intact DP pulse) present.     Cervical back: Neck supple.  Skin:    Findings: No rash.  Neurological:     Mental Status: She is alert.  Psychiatric:        Mood and Affect: Mood normal.     ED Results / Procedures / Treatments   Labs (all labs ordered are listed, but only abnormal results are displayed) Labs Reviewed  COMPREHENSIVE METABOLIC PANEL - Abnormal; Notable for the following components:      Result Value   Glucose, Bld 134 (*)    All other components within normal limits  CBC WITH DIFFERENTIAL/PLATELET    EKG None  Radiology DG Tibia/Fibula Left  Result Date: 10/01/2022 CLINICAL DATA:  Increasing  pain and redness. EXAM: LEFT TIBIA AND FIBULA - 2 VIEW COMPARISON:  None Available. FINDINGS: There is no evidence of fracture or other focal bone lesions. Soft tissues are unremarkable. Osteopenia. IMPRESSION: Osteopenia.  No acute osseous abnormality. Electronically Signed   By: Karen Kays M.D.   On: 10/01/2022 17:15    Procedures Procedures    Medications Ordered in ED Medications  vancomycin (VANCOCIN) IVPB 1000 mg/200 mL premix (0 mg Intravenous Stopped 10/01/22 1817)    ED Course/ Medical Decision Making/ A&P  Medical Decision Making Amount and/or Complexity of Data Reviewed Labs: ordered. Radiology: ordered.  Risk Prescription drug management.   BP (!) 154/79   Pulse 91   Temp 98 F (36.7 C) (Oral)   Resp 18   SpO2 95%   55:51 PM 77 year old female significant history of CAD, hypertension, fibromyalgia, diabetes who was sent here by PCP office with concerns of nonhealing wound.  A month ago patient suffered a cut to her left lower extremity.  She was initially evaluated by her primary care provider and was started on Keflex.  She finished the course, without any improvement.  And she was subsequently seen again by her doctor and had an x-ray done at the area and her medication was switched to doxycycline.  Despite finish a round of doxycycline, wound still persist and she had a wound culture which is sensitive for doxycycline.  Patient was placed on another 10-day course of doxycycline.  She finished 7 days of the medication and noticed some improvement but still having pain and tenderness.  She is subsequently was seen evaluate by her doctor once again, and she had an x-ray of her left lower extremity that is negative for acute fracture and she also had an ultrasound of her left lower extremity that is normal.  Given her lack of resolution of her infection, after being on antibiotic for multiple rounds, patient was sent to the ER for further  care.  On exam this is a well-appearing elderly female resting comfortably in bed appears to be in no acute discomfort.  Heart with normal rate and rhythm, lungs clear to auscultation bilaterally abdomen is soft nontender left lower extremity remarkable for a scab noted to the mid tib-fib with surrounding hyperpigmented skin changes and tenderness to palpation approximately 5 cm in diameter.  No obvious abscess noted.  Patient is neurovascular intact.  Wound is likely an ongoing cellulitic skin changes from prior leg injury.  I have initiated vancomycin.  -Labs ordered, independently viewed and interpreted by me.  Labs remarkable for normal WBC, normal H&H -The patient was maintained on a cardiac monitor.  I personally viewed and interpreted the cardiac monitored which showed an underlying rhythm of: NSR -Imaging independently viewed and interpreted by me and I agree with radiologist's interpretation.  Result remarkable for Xray of LLE without acute changes -This patient presents to the ED for concern of leg wound, this involves an extensive number of treatment options, and is a complaint that carries with it a high risk of complications and morbidity.  The differential diagnosis includes cellulitis, abscess, venous stasis, compartment syndrome, fx, dislocation, retained fb -Co morbidities that complicate the patient evaluation includes HTN, DM -Treatment includes vancomycin -Reevaluation of the patient after these medicines showed that the patient improved -PCP office notes or outside notes reviewed -Discussion with specialist on call Pharmacist, Christiane Ha who recommend Linezolid abx for her skin infection.  Care discussed with attending Dr. Lockie Mola -Escalation to admission/observation considered: patients feels much better, is comfortable with discharge, and will follow up with PCP -Prescription medication considered, patient comfortable with linezolid -Social Determinant of Health considered  which includes social isolation         Final Clinical Impression(s) / ED Diagnoses Final diagnoses:  Cellulitis of left leg without foot    Rx / DC Orders ED Discharge Orders          Ordered    linezolid (ZYVOX) 600 MG tablet  2 times daily  10/01/22 1756              Fayrene Helper, PA-C 10/01/22 1847    Virgina Norfolk, DO 10/01/22 2229

## 2022-10-04 ENCOUNTER — Telehealth: Payer: Self-pay | Admitting: Cardiology

## 2022-10-04 ENCOUNTER — Telehealth: Payer: Self-pay | Admitting: Physician Assistant

## 2022-10-04 MED ORDER — AMLODIPINE BESYLATE 5 MG PO TABS: 5.0000 mg | ORAL_TABLET | Freq: Every day | ORAL | 3 refills | Status: DC

## 2022-10-04 NOTE — Telephone Encounter (Signed)
Duplicate message.  See other phone note from today. 

## 2022-10-04 NOTE — Telephone Encounter (Signed)
Patient returned RN's call. 

## 2022-10-04 NOTE — Telephone Encounter (Signed)
Lewayne Bunting, MD  to Freddi Starr, RN     10/04/22 12:23 PM Add amlodipine 5 mg daily and follow BP Olga Millers  Rx(s) sent to pharmacy electronically. Advised to check BP 1-2 times daily.  Check 1-2 hours after her BP meds and then at a consistent time again during the day.

## 2022-10-04 NOTE — Telephone Encounter (Signed)
Returned call to pt. Pt does not have any readings as she does not own a BP machine. Advised pt to get a cough and she states she will do so. She is on 2 BP medications that she has been on for several. She is having headaches everyday in the last 2 months and she states she never gets them. She takes extra strength Tylenol and it works. She has no other symptoms. She does get blurred vision but that is related to her blood sugar she states. Please advise.

## 2022-10-04 NOTE — Telephone Encounter (Signed)
Left message for pt to call.

## 2022-10-04 NOTE — Telephone Encounter (Signed)
Pt c/o BP issue: STAT if pt c/o blurred vision, one-sided weakness or slurred speech  1. What are your last 5 BP readings? Patient says her blood pressure has been running high. She did not have any readings- she says it has been ranging with the top number from 138 to 168   2. Are you having any other symptoms (ex. Dizziness, headache, blurred vision, passed out)? She says she have been having bad headaches  3. What is your BP issue? High blood pressure- patient wants to be seen- first available is in October

## 2022-10-09 ENCOUNTER — Other Ambulatory Visit: Payer: Self-pay | Admitting: Physician Assistant

## 2022-10-13 ENCOUNTER — Encounter: Payer: Self-pay | Admitting: Gastroenterology

## 2022-10-13 ENCOUNTER — Ambulatory Visit: Payer: Medicare Other | Admitting: Gastroenterology

## 2022-10-13 VITALS — BP 122/66 | HR 85 | Ht 61.0 in | Wt 114.0 lb

## 2022-10-13 DIAGNOSIS — M79605 Pain in left leg: Secondary | ICD-10-CM | POA: Diagnosis not present

## 2022-10-13 DIAGNOSIS — D120 Benign neoplasm of cecum: Secondary | ICD-10-CM

## 2022-10-13 DIAGNOSIS — Z86010 Personal history of colonic polyps: Secondary | ICD-10-CM | POA: Diagnosis not present

## 2022-10-13 DIAGNOSIS — L03116 Cellulitis of left lower limb: Secondary | ICD-10-CM | POA: Insufficient documentation

## 2022-10-13 DIAGNOSIS — Z1620 Resistance to unspecified antibiotic: Secondary | ICD-10-CM | POA: Diagnosis not present

## 2022-10-13 MED ORDER — NA SULFATE-K SULFATE-MG SULF 17.5-3.13-1.6 GM/177ML PO SOLN: 1.0000 | ORAL | 0 refills | Status: DC

## 2022-10-13 MED ORDER — ONDANSETRON HCL 4 MG PO TABS: ORAL_TABLET | ORAL | 0 refills | Status: DC

## 2022-10-13 NOTE — Patient Instructions (Addendum)
VISIT SUMMARY:  During our visit, we discussed your ongoing issues with recurrent ileocecal valve adenomas, chronic constipation, a severe cut on your ankle that has developed into cellulitis, and your prediabetes. We have made plans to address each of these concerns.  YOUR PLAN:  -RECURRENT ILEOCECAL VALVE ADENOMA: This is a growth in your intestine that has come back several times after removal. We will schedule another colonoscopy for later this year to check on it. If it comes back again, we may need to consider surgery.  -CHRONIC CONSTIPATION: This is a long-term issue where you have difficulty passing stool. We will continue with your current treatment plan.  INSTRUCTIONS:  Start Miralax ( 1 week prior to colonoscopy) 1 capful daily in at least 8 ounces of water daily.   We have sent the following medications to your pharmacy for you to pick up at your convenience: Suprep , Zofran   Take Zofran - 1 tablet by mouth 30 mins before drinking Suprep ( colonscopy preparation)   You have been scheduled for a colonoscopy. Please follow written instructions given to you at your visit today.   Please pick up your prep supplies at the pharmacy within the next 1-3 days.  If you use inhalers (even only as needed), please bring them with you on the day of your procedure.  DO NOT TAKE 7 DAYS PRIOR TO TEST- Trulicity (dulaglutide) Ozempic, Wegovy (semaglutide) Mounjaro (tirzepatide) Bydureon Bcise (exanatide extended release)  DO NOT TAKE 1 DAY PRIOR TO YOUR TEST Rybelsus (semaglutide) Adlyxin (lixisenatide) Victoza (liraglutide) Byetta (exanatide) ___________________________________________________________________________  Due to recent changes in healthcare laws, you may see the results of your imaging and laboratory studies on MyChart before your provider has had a chance to review them.  We understand that in some cases there may be results that are confusing or concerning to you. Not  all laboratory results come back in the same time frame and the provider may be waiting for multiple results in order to interpret others.  Please give [place] 48 hours in order for your provider to thoroughly review all the results before contacting the office for clarification of your results.  Thank you for choosing me and [facility].  Dr. Meridee Score

## 2022-10-13 NOTE — Progress Notes (Signed)
GASTROENTEROLOGY OUTPATIENT CLINIC VISIT   Primary Care Provider Shelva Majestic, MD [address] [place] [address] [phone]  Referring Provider Dr. Bosie Clos  Patient Profile: Belinda Day is a 77 y.o. female with a pmh significant for CAD, hypertension, hyperlipidemia, hypothyroidism, GERD, diverticulosis (previous diverticulitis episodes), family history colon cancer (mother/father), colon polyps (TAs & recurrent ICV adenoma status post previous resection by [facility] and then [facility] and repeat recurrence and resection in 2023).  The patient presents to the [facility] for an evaluation and management of problem(s) noted below:  Problem List 1. Hx of adenomatous colonic polyps   2. Tubular adenoma of ileocecal valve   3. Cellulitis of left lower extremity     Discussed the use of AI scribe software for clinical note transcription with the patient, who gave verbal consent to proceed.  History of Present Illness Please see prior GI notes for full details of HPI.  Interval History The patient, with a history of recurrent ileocecal valve adenoma and chronic constipation, presents for a follow-up visit today.  Advanced resection of the recurrent polyp was performed in November 2023 with findings of tubular adenoma without high-grade dysplasia.  She reports no new gastrointestinal issues since her last colonoscopy, with constipation remaining a lifelong issue. The patient has had this particular polyp worked on multiple times by prior GI providers (including an Community education officer).  She expresses a desire to have another colonoscopy this year to recheck the polyp site and she is hopeful this will be a thing of the past.  She also experienced nausea during the prep, which she managed by alternating swallows of the prep solution with Coca Cola. Unfortunately, the patient has been dealing with a severe cut on her ankle, sustained six weeks ago during a  fall. The wound has been treated consistently since the incident, but has developed into cellulitis despite four different antibiotics.   She reports significant pain in the area and will be seeing her provider at [facility] and she anticipates hospital admission for IV antibiotics and possible surgical intervention (though she does not know this for certain).   GI Review of Systems Positive as above Negative for dysphagia, odynophagia, pain, change in bowel habits, melena, hematochezia   Review of Systems General: Denies fevers/chills/weight loss unintentionally Cardiovascular: Denies chest pain Pulmonary: Denies shortness of breath Gastroenterological: See HPI Genitourinary: Denies darkened urine  Hematological: Denies easy bruising/bleeding Dermatological: Denies jaundice Psychological: Mood is stable   Medications Current Outpatient Medications  Medication Sig Dispense Refill   acetaminophen (TYLENOL) 650 MG CR tablet Take 650 mg by mouth every 8 (eight) hours as needed for pain.     albuterol (PROAIR HFA) 108 (90 Base) MCG/ACT inhaler Inhale 2 puffs into the lungs every 6 (six) hours as needed for wheezing or shortness of breath. 1 each 2   ALPRAZolam (XANAX) 0.25 MG tablet TAKE 1 TABLET BY MOUTH AT BEDTIME AS NEEDED FOR SLEEP. May take very sparingly for daytime anxiety at least 8 hours from nighttime dose. Do not drive for 8 hours after taking 95 tablet 0   amitriptyline (ELAVIL) 50 MG tablet Take 1 tablet (50 mg total) by mouth at bedtime. 90 tablet 3   amLODipine (NORVASC) 5 MG tablet Take 1 tablet (5 mg total) by mouth daily. 90 tablet 3   aspirin EC 81 MG tablet Take 81 mg by mouth daily.     atorvastatin (LIPITOR) 80 MG tablet TAKE 1 TABLET(80 MG) BY MOUTH  DAILY 90 tablet 1   calcium carbonate (OS-CAL - DOSED IN MG OF ELEMENTAL CALCIUM) 1250 (500 Ca) MG tablet Take 1 tablet by mouth daily.     cholecalciferol (VITAMIN D3) 25 MCG (1000 UNIT) tablet Take 1,000 Units by  mouth daily.     Coenzyme Q10 (COQ10) 200 MG CAPS Take 200 mg by mouth daily.     cyanocobalamin (VITAMIN B12) 1000 MCG tablet Take 1,000 mcg by mouth daily.     doxycycline (ADOXA) 100 MG tablet Take 100 mg by mouth 2 (two) times daily.     linezolid (ZYVOX) 600 MG tablet Take 1 tablet (600 mg total) by mouth 2 (two) times daily. 20 tablet 0   Melatonin 5 MG TABS Take 5 mg by mouth at bedtime.     metFORMIN (GLUCOPHAGE) 500 MG tablet TAKE 1 TABLET(500 MG) BY MOUTH TWICE DAILY WITH A MEAL 180 tablet 3   mometasone-formoterol (DULERA) 200-5 MCG/ACT AERO INHALE 2 PUFFS INTO THE LUNGS EVERY MORNING AND EVERY NIGHT AT BEDTIME 13 g 5   Multiple Vitamin (MULTIVITAMIN) tablet Take 1 tablet by mouth daily.     Na Sulfate-K Sulfate-Mg Sulf (SUPREP BOWEL PREP KIT) 17.5-3.13-1.6 GM/177ML SOLN Take 1 kit by mouth as directed. For colonoscopy prep 354 mL 0   nebivolol (BYSTOLIC) 2.5 MG tablet TAKE 1 TABLET(2.5 MG) BY MOUTH DAILY 90 tablet 3   ondansetron (ZOFRAN) 4 MG tablet 1 tablet by mouth 30 mins before drinking Suprep ( colonscopy preparation) 3 tablet 0   Respiratory Therapy Supplies (FLUTTER) DEVI Use as directed 1 each 0   SYNTHROID 75 MCG tablet TAKE 1 TABLET(75 MCG) BY MOUTH DAILY BEFORE AND BREAKFAST 90 tablet 3   telmisartan (MICARDIS) 80 MG tablet TAKE 1 TABLET(80 MG) BY MOUTH DAILY 90 tablet 3   No current facility-administered medications for this visit.    Allergies Allergies  Allergen Reactions   Bactrim [Sulfamethoxazole-Trimethoprim] Hives, Itching and Other (See Comments)    Bruised like areas on body   Ciprofloxacin Other (See Comments)    Body aches   Codeine Nausea Only    REACTION: nausea   Erythromycin Nausea Only    REACTION: nausea   Levaquin [Levofloxacin] Other (See Comments)    REACTION: aches    Histories Past Medical History:  Diagnosis Date   Bronchiectasis    oxygen at night in the past   CAD (coronary artery disease)    Cellulitis    COPD (chronic  obstructive pulmonary disease) (HCC) bronchiectasis   Diabetes mellitus without complication (HCC) pre-diabetes   Diverticulitis 01/26/2012   GERD (gastroesophageal reflux disease)    History of shingles 04/25/2012   HTN (hypertension)    Hyperlipidemia    Hypothyroidism    MAI (mycobacterium avium-intracellulare) (HCC)    Neuritis of upper extremity    Substance abuse (HCC)    Past Surgical History:  Procedure Laterality Date   COLONOSCOPY WITH PROPOFOL N/A 01/21/2022   Procedure: COLONOSCOPY WITH PROPOFOL;  Surgeon: Meridee Score Netty Starring., MD;  Location: [facility];  Service: Gastroenterology;  Laterality: N/A;   CORONARY ARTERY BYPASS GRAFT  01/25/2002   x3 CABG   ENDOSCOPIC MUCOSAL RESECTION N/A 01/21/2022   Procedure: ENDOSCOPIC MUCOSAL RESECTION;  Surgeon: Meridee Score Netty Starring., MD;  Location: [facility];  Service: Gastroenterology;  Laterality: N/A;   HAND SURGERY     Dr. Amanda Pea sept 2022- improved hand movement   HEMOSTASIS CLIP PLACEMENT  01/21/2022   Procedure: HEMOSTASIS CLIP PLACEMENT;  Surgeon: Lemar Lofty., MD;  Location: [facility];  Service: Gastroenterology;;   HOT HEMOSTASIS N/A 01/21/2022   Procedure: HOT HEMOSTASIS (ARGON PLASMA COAGULATION/BICAP);  Surgeon: Lemar Lofty., MD;  Location: [facility];  Service: Gastroenterology;  Laterality: N/A;   POLYPECTOMY  01/21/2022   Procedure: POLYPECTOMY;  Surgeon: Mansouraty, Netty Starring., MD;  Location: [facility];  Service: Gastroenterology;;   Sunnie Nielsen LIFTING INJECTION  01/21/2022   Procedure: SUBMUCOSAL LIFTING INJECTION;  Surgeon: Lemar Lofty., MD;  Location: [facility];  Service: Gastroenterology;;   Social History   Socioeconomic History   Marital status: Single    Spouse name: Not on file   Number of children: Not on file   Years of education: Not on file   Highest education level: Not on file  Occupational History   Occupation: Retired   Tobacco Use    Smoking status: Former    Current packs/day: 0.00    Average packs/day: 1 pack/day for 25.0 years (25.0 ttl pk-yrs)    Types: Cigarettes    Start date: 01/26/1955    Quit date: 01/26/1980    Years since quitting: 42.7   Smokeless tobacco: Never  Substance and Sexual Activity   Alcohol use: No   Drug use: No   Sexual activity: Not Currently    Birth control/protection: None  Other Topics Concern   Not on file  Social History Narrative   Family: Single never married, no children, cat and rehabs turtles and tortoise   Went to [employer] in [place] and social work, some business courses at [employer], EMT for 6 years.    LIves alone. Completely independent.    Lives in retirement community.       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      Hobbies: kayaking, gardening- mows own lawn   Social Determinants of Health   Financial Resource Strain: Low Risk  (07/12/2022)   Overall Financial Resource Strain (CARDIA)    Difficulty of Paying Living Expenses: Not hard at all  Food Insecurity: No Food Insecurity (07/12/2022)   Hunger Vital Sign    Worried About Running Out of Food in the Last Year: Never true    Ran Out of Food in the Last Year: Never true  Transportation Needs: No Transportation Needs (07/12/2022)   PRAPARE - Administrator, Civil Service (Medical): No    Lack of Transportation (Non-Medical): No  Physical Activity: Sufficiently Active (07/12/2022)   Exercise Vital Sign    Days of Exercise per Week: 3 days    Minutes of Exercise per Session: 90 min  Stress: No Stress Concern Present (07/12/2022)   [employer] of Occupational Health - Occupational Stress Questionnaire    Feeling of Stress : Not at all  Social Connections: Socially Isolated (07/12/2022)   Social Connection and Isolation Panel [NHANES]    Frequency of Communication with Friends and Family: More than three times a week    Frequency of Social  Gatherings with Friends and Family: More than three times a week    Attends Religious Services: Never    Database administrator or Organizations: No    Attends Banker Meetings: Never    Marital Status: Never married  Intimate Partner Violence: Not At Risk (07/12/2022)   Humiliation, Afraid, Rape, and Kick questionnaire    Fear of Current or Ex-Partner: No    Emotionally Abused: No    Physically Abused: No    Sexually Abused: No   Family History  Problem Relation Age of Onset   Colon cancer Mother    Hyperlipidemia Mother    Hypertension Mother    Heart disease Mother    Stroke Mother    Diabetes Mother    Cancer Mother    Colon cancer Father    Arthritis Father    Hyperlipidemia Father    Hypertension Father    Heart disease Father    Stroke Father    Cancer Father    Vision loss Father    Colon cancer Maternal Grandmother    Cancer Maternal Grandmother    Atopy Neg Hx    Stomach cancer Neg Hx    Esophageal cancer Neg Hx    Pancreatic cancer Neg Hx    Inflammatory bowel disease Neg Hx    Liver disease Neg Hx    Rectal cancer Neg Hx    I have reviewed her medical, social, and family history in detail and updated the electronic medical record as necessary.    PHYSICAL EXAMINATION  BP 122/66   Pulse 85   Ht 5\' 1"  (1.549 m)   Wt 114 lb (51.7 kg)   SpO2 98%   BMI 21.54 kg/m  Wt Readings from Last 3 Encounters:  10/13/22 114 lb (51.7 kg)  09/08/22 112 lb (50.8 kg)  07/12/22 112 lb (50.8 kg)  GEN: NAD, appears stated age, doesn't appear chronically ill PSYCH: Cooperative, without pressured speech EYE: Conjunctivae pink, sclerae anicteric ENT: MMM CV: Nontachycardic RESP: No audible wheezing  GI: NABS, soft, NT/ND, without rebound or guarding MSK/EXT: No lower extremity edema SKIN: No jaundice NEURO:  Alert & Oriented x 3, no focal deficits   REVIEW OF DATA  I reviewed the following data at the time of this encounter:  GI Procedures and  Studies  December 2023 Colonoscopy - Hemorrhoids found on digital rectal exam. - The examined portion of the ileum was normal. - Post mucosectomy scar in the cecum/ileocecal valve region. Residual/recurrent adenomatous tissue present (approximately 30 mm in size). Piecemeal mucosal resection performed. Treated with argon plasma coagulation (APC) to base/margin. Clips (MR conditional) were placed. Clip manufacturer: [employer]. - Three 2 to 5 mm polyps in the descending colon, in the ascending colon and in the cecum, removed with a cold snare. Resected and retrieved. - Diverticulosis in the entire examined colon. - Normal mucosa in the entire examined colon otherwise. - Non-bleeding non-thrombosed external and internal hemorrhoids.  Pathology FINAL MICROSCOPIC DIAGNOSIS:  A. COLON, CECUM, ILEOCECAL VALVE, DESCENDING, POLYPECTOMY:  - Tubular adenoma, multiple fragments.  No high-grade dysplasia or  malignancy.   Laboratory Studies  Reviewed those in epic  Imaging Studies  No imaging studies to review   ASSESSMENT  Ms. Doubleday is a 77 y.o. female with a pmh significant for CAD, hypertension, hyperlipidemia, hypothyroidism, GERD, diverticulosis (previous diverticulitis episodes), family history colon cancer (mother/father), colon polyps (TAs & recurrent ICV adenoma status post previous resection by [facility] and then [facility] and repeat recurrence and resection in 2023).  The patient is seen today for evaluation and management of:  1. Hx of adenomatous colonic polyps   2. Tubular adenoma of ileocecal valve   3. Cellulitis of left lower extremity    The patient is hemodynamically and clinically stable from a GI perspective.  I am hopeful that our resection in 2023 will be her last, but we must be prepared for potential further endoscopic if evaluation and treatment with potential Endorotor.  FTRD will not be possible due to the location  and the IC valve.  Avulsion techniques may need to be  considered too.  Time will tell.  She is overdue for this but needs time to get better from her left lower extremity cellulitis for which she has been on multiple antibiotic regimens over the course of the last 6 weeks.  She is going to update [place] via MyChart or via telephone call as to what her physician decides to do in regards to further treatment or admission into the hospital.  As such we are going to plan for colonoscopy in mid November mid December and will adjust her bowel preparation accordingly and give antiemetics to help her.   PLAN  Preprocedure labs to be performed closer to procedure date Zofran given to give patient less risk of nausea during her preparation Proceed with scheduling colonoscopy with EMR follow-up Follow-up to be dictated by results of colonoscopy though patient still understands that as this has been worked on 4 previous occasions, she will have potential need for surgical intervention if this continues to be an issue   Orders Placed This Encounter  Procedures   Procedural/ Surgical Case Request: COLONOSCOPY WITH PROPOFOL, ENDOSCOPIC MUCOSAL RESECTION   CBC   Basic Metabolic Panel (BMET)   Ambulatory referral to Gastroenterology    New Prescriptions   NA SULFATE-K SULFATE-MG SULF (SUPREP BOWEL PREP KIT) 17.5-3.13-1.6 GM/177ML SOLN    Take 1 kit by mouth as directed. For colonoscopy prep   ONDANSETRON (ZOFRAN) 4 MG TABLET    1 tablet by mouth 30 mins before drinking Suprep ( colonscopy preparation)    Planned Follow Up No follow-ups on file.   Total Time in Face-to-Face and in Coordination of Care for patient including independent/personal interpretation/review of prior testing, medical history, examination, medication adjustment, communicating results with the patient directly, and documentation within the EHR is 25 minutes.   Corliss Parish, MD [facility] Advanced Endoscopy Office # [phone]

## 2022-10-14 ENCOUNTER — Emergency Department (HOSPITAL_COMMUNITY)
Admission: EM | Admit: 2022-10-14 | Discharge: 2022-10-14 | Disposition: A | Discharge status: Home / Self Care | Payer: Medicare Other | Attending: Emergency Medicine | Admitting: Emergency Medicine

## 2022-10-14 ENCOUNTER — Emergency Department (HOSPITAL_COMMUNITY): Payer: Medicare Other

## 2022-10-14 DIAGNOSIS — M799 Soft tissue disorder, unspecified: Secondary | ICD-10-CM | POA: Diagnosis not present

## 2022-10-14 DIAGNOSIS — E119 Type 2 diabetes mellitus without complications: Secondary | ICD-10-CM

## 2022-10-14 DIAGNOSIS — M79662 Pain in left lower leg: Secondary | ICD-10-CM | POA: Diagnosis present

## 2022-10-14 DIAGNOSIS — L03116 Cellulitis of left lower limb: Secondary | ICD-10-CM | POA: Diagnosis not present

## 2022-10-14 DIAGNOSIS — S81812A Laceration without foreign body, left lower leg, initial encounter: Secondary | ICD-10-CM | POA: Diagnosis not present

## 2022-10-14 LAB — COMPREHENSIVE METABOLIC PANEL
ALT: 28 U/L (ref 0–44)
AST: 33 U/L (ref 15–41)
Albumin: 4.2 g/dL (ref 3.5–5.0)
Alkaline Phosphatase: 76 U/L (ref 38–126)
Anion gap: 11 (ref 5–15)
BUN: 15 mg/dL (ref 8–23)
CO2: 28 mmol/L (ref 22–32)
Calcium: 9.5 mg/dL (ref 8.9–10.3)
Chloride: 98 mmol/L (ref 98–111)
Creatinine, Ser: 0.77 mg/dL (ref 0.44–1.00)
GFR, Estimated: >60 mL/min (ref >60)
Glucose, Bld: 104 mg/dL — ABNORMAL HIGH (ref 70–99)
Potassium: 4.1 mmol/L (ref 3.5–5.1)
Sodium: 137 mmol/L (ref 135–145)
Total Bilirubin: 0.9 mg/dL (ref 0.3–1.2)
Total Protein: 7.7 g/dL (ref 6.5–8.1)

## 2022-10-14 LAB — CBC WITH DIFFERENTIAL/PLATELET
Abs Immature Granulocytes: 0.02 10*3/uL (ref 0.00–0.07)
Basophils Absolute: 0 10*3/uL (ref 0.0–0.1)
Basophils Relative: 1 %
Eosinophils Absolute: 0 10*3/uL (ref 0.0–0.5)
Eosinophils Relative: 0 %
HCT: 41.7 % (ref 36.0–46.0)
Hemoglobin: 13.6 g/dL (ref 12.0–15.0)
Immature Granulocytes: 0 %
Lymphocytes Relative: 15 %
Lymphs Abs: 1.1 10*3/uL (ref 0.7–4.0)
MCH: 31.6 pg (ref 26.0–34.0)
MCHC: 32.6 g/dL (ref 30.0–36.0)
MCV: 97 fL (ref 80.0–100.0)
Monocytes Absolute: 0.4 10*3/uL (ref 0.1–1.0)
Monocytes Relative: 6 %
Neutro Abs: 5.6 10*3/uL (ref 1.7–7.7)
Neutrophils Relative %: 78 %
Platelets: 265 10*3/uL (ref 150–400)
RBC: 4.3 MIL/uL (ref 3.87–5.11)
RDW: 12 % (ref 11.5–15.5)
WBC: 7.2 10*3/uL (ref 4.0–10.5)
nRBC: 0 % (ref 0.0–0.2)

## 2022-10-14 LAB — I-STAT CG4 LACTIC ACID, ED
Lactic Acid, Venous: 2.6 mmol/L (ref 0.5–1.9)
Lactic Acid, Venous: 3.5 mmol/L (ref 0.5–1.9)

## 2022-10-14 MED ORDER — ONDANSETRON 4 MG PO TBDP
4.0000 mg | ORAL_TABLET | Freq: Once | ORAL | Status: AC
  Administered 2022-10-14: 4 mg via ORAL
  Filled 2022-10-14: qty 1

## 2022-10-14 MED ORDER — DEXTROSE 5 % IV SOLN
1500.0000 mg | Freq: Once | INTRAVENOUS | Status: AC
  Administered 2022-10-14: 1500 mg via INTRAVENOUS
  Filled 2022-10-14: qty 75

## 2022-10-14 MED ORDER — ACETAMINOPHEN 500 MG PO TABS
500.0000 mg | ORAL_TABLET | Freq: Once | ORAL | Status: AC
  Administered 2022-10-14: 500 mg via ORAL
  Filled 2022-10-14: qty 1

## 2022-10-14 MED ORDER — GADOBUTROL 1 MMOL/ML IV SOLN
5.0000 mL | Freq: Once | INTRAVENOUS | Status: AC | PRN
  Administered 2022-10-14: 5 mL via INTRAVENOUS

## 2022-10-14 NOTE — ED Provider Notes (Signed)
4:37 PM Care assumed from Dr. Rubin Payor.  At time of transfer of care, patient awaiting results of MRI and then discussion with infectious disease with Dr. Ninetta Lights to create a plan for this patient with concern for leg cellulitis.  6:15 PM MRI returned with cellulitis but no evidence of abscess.  It also showed some thin periosteal edema and enhancement on the tibia that is likely reactive with no definite osteomyelitis.  Per plan, will call infect disease and discuss a plan.  6:39 PM Spoke with infectious disease and they recommended Dalvance and discharged with ID follow-up in about 1 week.  Will order Dalvance per pharmacy.  9:48 PM Patient bleed antibiotics and tolerated well.  Passed p.o. challenge.  Patient be discharged to follow-up with outpatient infectious disease team.  Clinical Impression: 1. Cellulitis of left lower extremity   2. Left leg cellulitis     Disposition: Discharge  Condition: Good  I have discussed the results, Dx and Tx plan with the pt(& family if present). He/she/they expressed understanding and agree(s) with the plan. Discharge instructions discussed at great length. Strict return precautions discussed and pt &/or family have verbalized understanding of the instructions. No further questions at time of discharge.    New Prescriptions   No medications on file    Follow Up: [facility]              [address] [place] [address] [place] [address]    Shelva Majestic, MD [address] [place] [address] [phone]        Raeshawn Tafolla, Canary Brim, MD 10/14/22 2149

## 2022-10-14 NOTE — Consult Note (Signed)
[facility]    Date of Admission:  10/14/2022   Total days of antibiotics: 35?               Reason for Consult: Cellulitis    Referring Provider: Dr Rubin Payor   Assessment: Celulilitis Diabetes Mellitus type 2   Plan: Consider MRi Consider single dose of oritavancin/dalbavancin   Comment- Her "cellulitis" is unimpressive except for its chronicity. I am concerned about what is causing her pain. Deeper imaging may be helpful.  Also, it is possible that she has a basal cell in the distal wound (most tender area).   Thank you so much for this interesting consult,  Active Problems:   * No active hospital problems. *     HPI: Belinda Day is a 77 y.o. female  with hx of DM2, CAD, HTn, diverticulosis, comes to ED as directed by her PCP for 5 weeks of cellulitis of her LLE. She fell off at ladder at that time, and lacerated her leg.  She has since been on cipro, doxy, vanco (singel dose at [facility]) and linezolid (finished 2 weeks this AM).  She is also on azithro 250mg  qday for (3 years) chronic bronchitis. On 8-14 was also given rx of silver sulfadiazene which she found soothing, but did not change her course.  She had a (superficial) Cx done 8-14 that grew MRSE.  She is concerned today about continued pain. She has had no f/c. She feels that the redness is better. No discharge from the wound since the initial wound (clear/pale yellow).  She had a doppler/ultrasound that was negative 09-08-22.   Review of Systems: ROS No change in vision. No paresthesias. +constipation. Normal urination, see HPI.   Past Medical History:  Diagnosis Date   Bronchiectasis    oxygen at night in the past   CAD (coronary artery disease)    Cellulitis    COPD (chronic obstructive pulmonary disease) (HCC) bronchiectasis   Diabetes mellitus without complication (HCC) pre-diabetes   Diverticulitis 01/26/2012   GERD (gastroesophageal reflux disease)     History of shingles 04/25/2012   HTN (hypertension)    Hyperlipidemia    Hypothyroidism    MAI (mycobacterium avium-intracellulare) (HCC)    Neuritis of upper extremity    Substance abuse (HCC)     Social History   Tobacco Use   Smoking status: Former    Current packs/day: 0.00    Average packs/day: 1 pack/day for 25.0 years (25.0 ttl pk-yrs)    Types: Cigarettes    Start date: 01/26/1955    Quit date: 01/26/1980    Years since quitting: 42.7   Smokeless tobacco: Never  Substance Use Topics   Alcohol use: No   Drug use: No    Family History  Problem Relation Age of Onset   Colon cancer Mother    Hyperlipidemia Mother    Hypertension Mother    Heart disease Mother    Stroke Mother    Diabetes Mother    Cancer Mother    Colon cancer Father    Arthritis Father    Hyperlipidemia Father    Hypertension Father    Heart disease Father    Stroke Father    Cancer Father    Vision loss Father    Colon cancer Maternal Grandmother    Cancer Maternal Grandmother    Atopy Neg Hx    Stomach cancer Neg Hx    Esophageal cancer Neg  Hx    Pancreatic cancer Neg Hx    Inflammatory bowel disease Neg Hx    Liver disease Neg Hx    Rectal cancer Neg Hx      Medications: I have reviewed the patient's current medications.  Abtx:  Anti-infectives (From admission, onward)    None         OBJECTIVE: Blood pressure 131/66, pulse 76, temperature 98.5 F (36.9 C), temperature source Oral, resp. rate 14, SpO2 99%.  Physical Exam Eyes- EOMI, PERRL Mouth- few echymoses Neck- nontender, no LAN Chest- diffuse rhonchi.  CV- RRR Abd- BS+, soft, nontender Ext- normal LE pulses.  She has a raised lesion on the lower leg (~ 3 mm), very tender, no d/c. There is also a small scab proximal to this (~2x3 mm). She has a crescent shaped echymoses proximal to this.  She has no cordis or calf tenderness.   Lab Results Results for orders placed or performed during the hospital encounter of  10/14/22 (from the past 48 hour(s))  Comprehensive metabolic panel     Status: Abnormal   Collection Time: 10/14/22  8:32 AM  Result Value Ref Range   Sodium 137 135 - 145 mmol/L   Potassium 4.1 3.5 - 5.1 mmol/L   Chloride 98 98 - 111 mmol/L   CO2 28 22 - 32 mmol/L   Glucose, Bld 104 (H) 70 - 99 mg/dL    Comment: Glucose reference range applies only to samples taken after fasting for at least 8 hours.   BUN 15 8 - 23 mg/dL   Creatinine, Ser 6.29 0.44 - 1.00 mg/dL   Calcium 9.5 8.9 - 52.8 mg/dL   Total Protein 7.7 6.5 - 8.1 g/dL   Albumin 4.2 3.5 - 5.0 g/dL   AST 33 15 - 41 U/L   ALT 28 0 - 44 U/L   Alkaline Phosphatase 76 38 - 126 U/L   Total Bilirubin 0.9 0.3 - 1.2 mg/dL   GFR, Estimated >41 >32 mL/min    Comment: (NOTE) Calculated using the CKD-EPI Creatinine Equation (2021)    Anion gap 11 5 - 15    Comment: Performed at [facility], [address] [place], [place] [address]  CBC with Differential     Status: None   Collection Time: 10/14/22  8:32 AM  Result Value Ref Range   WBC 7.2 4.0 - 10.5 K/uL   RBC 4.30 3.87 - 5.11 MIL/uL   Hemoglobin 13.6 12.0 - 15.0 g/dL   HCT 27.2 53.6 - 64.4 %   MCV 97.0 80.0 - 100.0 fL   MCH 31.6 26.0 - 34.0 pg   MCHC 32.6 30.0 - 36.0 g/dL   RDW 03.4 74.2 - 59.5 %   Platelets 265 150 - 400 K/uL   nRBC 0.0 0.0 - 0.2 %   Neutrophils Relative % 78 %   Neutro Abs 5.6 1.7 - 7.7 K/uL   Lymphocytes Relative 15 %   Lymphs Abs 1.1 0.7 - 4.0 K/uL   Monocytes Relative 6 %   Monocytes Absolute 0.4 0.1 - 1.0 K/uL   Eosinophils Relative 0 %   Eosinophils Absolute 0.0 0.0 - 0.5 K/uL   Basophils Relative 1 %   Basophils Absolute 0.0 0.0 - 0.1 K/uL   Immature Granulocytes 0 %   Abs Immature Granulocytes 0.02 0.00 - 0.07 K/uL    Comment: Performed at [facility], [address] [place], [place] [address]  I-Stat Lactic Acid, ED  Status: Abnormal   Collection Time: 10/14/22  8:40 AM  Result Value Ref Range    Lactic Acid, Venous 2.6 (HH) 0.5 - 1.9 mmol/L   Comment NOTIFIED PHYSICIAN   I-Stat Lactic Acid, ED     Status: Abnormal   Collection Time: 10/14/22 11:18 AM  Result Value Ref Range   Lactic Acid, Venous 3.5 (HH) 0.5 - 1.9 mmol/L   Comment NOTIFIED PHYSICIAN       Component Value Date/Time   SDES BLOOD R ARM 04/17/2015 2158   SPECREQUEST  04/17/2015 2158    BOTTLES DRAWN AEROBIC AND ANAEROBIC 10CC EA IMMUNOCOMPROMISED   CULT  04/17/2015 2158    NO GROWTH 5 DAYS Performed at [facility]    REPTSTATUS 04/23/2015 FINAL 04/17/2015 2158   No results found. No results found for this or any previous visit (from the past 240 hour(s)).  Microbiology: No results found for this or any previous visit (from the past 240 hour(s)).  Radiographs and labs were personally reviewed by me.   Johny Sax, MD FACP [facility] [phone] 10/14/2022, 12:00 PM

## 2022-10-14 NOTE — ED Provider Notes (Signed)
[place] EMERGENCY DEPARTMENT AT [facility] Provider Note   CSN: [id number] Arrival date & time: 10/14/22  0865     History  Chief Complaint  Patient presents with   Cellulitis    GETSEMANI VOROS is a 77 y.o. female.  HPI Patient got sent in for IV antibiotics.  Has had a laceration of his left lower leg for 6 weeks.  Has been on multiple antibiotics without relief.  Most recently been on linezolid.  States she fell off a ladder and had a laceration through getting of it.  Saw PCP yesterday who told her to come in.     Past Medical History:  Diagnosis Date   Bronchiectasis    oxygen at night in the past   CAD (coronary artery disease)    Cellulitis    COPD (chronic obstructive pulmonary disease) (HCC) bronchiectasis   Diabetes mellitus without complication (HCC) pre-diabetes   Diverticulitis 01/26/2012   GERD (gastroesophageal reflux disease)    History of shingles 04/25/2012   HTN (hypertension)    Hyperlipidemia    Hypothyroidism    MAI (mycobacterium avium-intracellulare) (HCC)    Neuritis of upper extremity    Substance abuse (HCC)     Home Medications Prior to Admission medications   Medication Sig Start Date End Date Taking? Authorizing Provider  acetaminophen (TYLENOL) 650 MG CR tablet Take 650 mg by mouth every 8 (eight) hours as needed for pain.    [provider]  albuterol (PROAIR HFA) 108 (90 Base) MCG/ACT inhaler Inhale 2 puffs into the lungs every 6 (six) hours as needed for wheezing or shortness of breath. 08/31/21   Nyoka Cowden, MD  ALPRAZolam (XANAX) 0.25 MG tablet TAKE 1 TABLET BY MOUTH AT BEDTIME AS NEEDED FOR SLEEP. May take very sparingly for daytime anxiety at least 8 hours from nighttime dose. Do not drive for 8 hours after taking 06/30/22   Shelva Majestic, MD  amitriptyline (ELAVIL) 50 MG tablet Take 1 tablet (50 mg total) by mouth at bedtime. 05/27/22   Shelva Majestic, MD  amLODipine (NORVASC) 5 MG tablet Take 1  tablet (5 mg total) by mouth daily. 10/04/22   Lewayne Bunting, MD  aspirin EC 81 MG tablet Take 81 mg by mouth daily.    [provider]  atorvastatin (LIPITOR) 80 MG tablet TAKE 1 TABLET(80 MG) BY MOUTH DAILY 10/11/22   Cannon Kettle, PA-C  calcium carbonate (OS-CAL - DOSED IN MG OF ELEMENTAL CALCIUM) 1250 (500 Ca) MG tablet Take 1 tablet by mouth daily.    [provider]  cholecalciferol (VITAMIN D3) 25 MCG (1000 UNIT) tablet Take 1,000 Units by mouth daily.    [provider]  Coenzyme Q10 (COQ10) 200 MG CAPS Take 200 mg by mouth daily.    [provider]  cyanocobalamin (VITAMIN B12) 1000 MCG tablet Take 1,000 mcg by mouth daily.    [provider]  doxycycline (ADOXA) 100 MG tablet Take 100 mg by mouth 2 (two) times daily. 09/22/22   [provider]  linezolid (ZYVOX) 600 MG tablet Take 1 tablet (600 mg total) by mouth 2 (two) times daily. 10/01/22   Fayrene Helper, PA-C  Melatonin 5 MG TABS Take 5 mg by mouth at bedtime.    [provider]  metFORMIN (GLUCOPHAGE) 500 MG tablet TAKE 1 TABLET(500 MG) BY MOUTH TWICE DAILY WITH A MEAL 05/27/22   Shelva Majestic, MD  mometasone-formoterol [facility]) 200-5 MCG/ACT AERO INHALE 2  PUFFS INTO THE LUNGS EVERY MORNING AND EVERY NIGHT AT BEDTIME 05/05/22   Nyoka Cowden, MD  Multiple Vitamin (MULTIVITAMIN) tablet Take 1 tablet by mouth daily.    [provider]  Na Sulfate-K Sulfate-Mg Sulf (SUPREP BOWEL PREP KIT) 17.5-3.13-1.6 GM/177ML SOLN Take 1 kit by mouth as directed. For colonoscopy prep 10/13/22   Mansouraty, Netty Starring., MD  nebivolol (BYSTOLIC) 2.5 MG tablet TAKE 1 TABLET(2.5 MG) BY MOUTH DAILY 10/26/21   Lewayne Bunting, MD  ondansetron [facility]) 4 MG tablet 1 tablet by mouth 30 mins before drinking Suprep ( colonscopy preparation) 10/13/22   Mansouraty, Netty Starring., MD  Respiratory Therapy Supplies (FLUTTER) DEVI Use as directed 09/03/14   Nyoka Cowden, MD  SYNTHROID 75 MCG  tablet TAKE 1 TABLET(75 MCG) BY MOUTH DAILY BEFORE AND BREAKFAST 06/30/22   Shelva Majestic, MD  telmisartan (MICARDIS) 80 MG tablet TAKE 1 TABLET(80 MG) BY MOUTH DAILY 11/16/21   Lewayne Bunting, MD      Allergies    Bactrim [sulfamethoxazole-trimethoprim], Ciprofloxacin, Codeine, Erythromycin, and Levaquin [levofloxacin]    Review of Systems   Review of Systems  Physical Exam Updated Vital Signs BP 128/64   Pulse 75   Temp 98.1 F (36.7 C) (Oral)   Resp 13   SpO2 98%  Physical Exam Vitals reviewed.  Skin:    Comments: Healing laceration left anterior lower leg.  Does have some surrounding erythema.  No fluctuance.  Is tender.  Not particularly indurated.  Neurological:     Mental Status: She is alert.     ED Results / Procedures / Treatments   Labs (all labs ordered are listed, but only abnormal results are displayed) Labs Reviewed  COMPREHENSIVE METABOLIC PANEL - Abnormal; Notable for the following components:      Result Value   Glucose, Bld 104 (*)    All other components within normal limits  I-STAT CG4 LACTIC ACID, ED - Abnormal; Notable for the following components:   Lactic Acid, Venous 2.6 (*)    All other components within normal limits  I-STAT CG4 LACTIC ACID, ED - Abnormal; Notable for the following components:   Lactic Acid, Venous 3.5 (*)    All other components within normal limits  CBC WITH DIFFERENTIAL/PLATELET    EKG None  Radiology DG Tibia/Fibula Left  Result Date: 10/14/2022 CLINICAL DATA:  Infection. Left lower leg and ankle cellulitis for 6 weeks. EXAM: LEFT TIBIA AND FIBULA - 2 VIEW COMPARISON:  Left tibia and fibula radiographs 10/01/2022 FINDINGS: There is again diffuse decreased bone mineralization. The cortices appear intact. No acute fracture or dislocation. No subcutaneous air. IMPRESSION: 1. No significant change from prior.  No cortical erosion. 2. Diffuse decreased bone mineralization. Electronically Signed   By: Neita Garnet M.D.    On: 10/14/2022 12:25    Procedures Procedures    Medications Ordered in ED Medications  acetaminophen (TYLENOL) tablet 500 mg (500 mg Oral Given 10/14/22 1432)  gadobutrol (GADAVIST) 1 MMOL/ML injection 5 mL (5 mLs Intravenous Contrast Given 10/14/22 1609)    ED Course/ Medical Decision Making/ A&P                                 Medical Decision Making Amount and/or Complexity of Data Reviewed Labs: ordered. Radiology: ordered.  Risk OTC drugs. Prescription drug management.   Patient with fall weeks ago.  Had laceration with erythema.  Has been on  Cipro, Doxy twice and through an ER dose of vancomycin and linezolid.  Continued pain and redness. White count reassuring.  Lactic acid slightly elevated.  Does not appear septic.  Did have x-ray 2 weeks ago.  Will repeat to look for signs of bony damage. Will discuss with infectious disease.  Blood work reassuring.  Has been seen by Dr. Ninetta Lights who recommended MRI with contrast.  Depending on results may need medicine such as Dalvance versus even less medicine.  Patient is at MRI now.  Care turned over to Dr. Rush Landmark        Final Clinical Impression(s) / ED Diagnoses Final diagnoses:  None    Rx / DC Orders ED Discharge Orders     None         Benjiman Core, MD 10/14/22 1614

## 2022-10-14 NOTE — Progress Notes (Signed)
Pharmacy Note:  Dalbavancin for Acute Bacterial Skin and Skin Structure Infection (ABSSSI) Patients to Belinda Day Discharge CHRYSTEL DEGEUS is an 77 y.o. female who presented to [facility] on 10/14/2022 with an Acute Bacterial Skin and Skin Structure Infection  Inclusion criteria - Indication []  Moderately large skin lesion (>=75 cm2 or larger - about the size of a baseball) [x]  Cellulitis  Inclusion Criteria - at least one SIRS criteria present []  WBC > 12,000 or < 4000 []  temp >100.9 or < 96.8 []  heart rate >90[]  respiratory rate >20  Patient was evaluated for the following exclusion criteria and no exclusions were found  Hardware involvement, Hypotension / shock, Elevated lactate (>2) without other explanation, gram-negative infection risk factors (bites, water exposure, infection after trauma, infection after skin graft, neutropenia, burns, severe immunocompromise), necrotizing fasciitis possible or confirmed, Known or suspected osteomyelitis or septic arthritis, endocarditis, diabetic foot infection, ischemic ulcers, post-operative wound infection, perirectal infections, need for drainage in the operating room, hand or facial infections, injection drug users with a fever, bacteremia, pregnancy or breastfeeding, allergy to related antibiotics like vancomycin, known liver disease (t.bili >2x ULN or AST/ALT 3x ULN)   Herby Abraham, Pharm.D Use secure chat for questions 10/14/2022 6:52 PM Clinical Pharmacist

## 2022-10-14 NOTE — Discharge Instructions (Signed)
Your history, exam, and evaluation today are consistent with recurrent cellulitis.  The infectious disease team recommended the IV antibiotic Dalvance which you tolerated well.  They recommended follow-up in clinic this week.  An ambulatory referral was placed but if they do not call you, please call to schedule appointment.  If any symptoms change or worsen acutely, please return to the nearest emergency department.

## 2022-10-14 NOTE — ED Triage Notes (Signed)
Patient here from home reporting left lower leg/ankle cellulitis x6 weeks. 4 different antibiotics with no relief.

## 2022-10-15 ENCOUNTER — Telehealth: Payer: Self-pay

## 2022-10-15 ENCOUNTER — Other Ambulatory Visit (HOSPITAL_COMMUNITY): Payer: Self-pay

## 2022-10-15 NOTE — Telephone Encounter (Signed)
Received message from Dr. Ninetta Lights that patient was seen in ED and received a dose of dalbavancin yesterday and needs ID clinic follow up.  Called [facility] to schedule, no answer. Left HIPAA compliant voicemail requesting callback.   Sandie Ano, RN

## 2022-10-21 ENCOUNTER — Encounter: Payer: Self-pay | Admitting: Infectious Diseases

## 2022-10-21 ENCOUNTER — Other Ambulatory Visit: Payer: Self-pay

## 2022-10-21 ENCOUNTER — Ambulatory Visit: Payer: Medicare Other | Admitting: Infectious Diseases

## 2022-10-21 VITALS — BP 126/81 | HR 80 | Temp 98.3°F | Wt 112.0 lb

## 2022-10-21 DIAGNOSIS — Z79899 Other long term (current) drug therapy: Secondary | ICD-10-CM

## 2022-10-21 DIAGNOSIS — L03116 Cellulitis of left lower limb: Secondary | ICD-10-CM

## 2022-10-21 HISTORY — DX: Other long term (current) drug therapy: Z79.899

## 2022-10-21 NOTE — Progress Notes (Addendum)
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   Abnormal colonoscopy 12/05/2021   Hx of adenomatous colonic polyps 12/05/2021   Family history of colon cancer 12/05/2021   Aortic atherosclerosis (HCC) 05/28/2020   Type 2 diabetes mellitus without complication, without long-term current use of insulin (HCC) 09/07/2017   Cystitis 11/05/2016   Cerebrovascular disease 01/31/2015   Former smoker 08/05/2014   Hyperglycemia 08/05/2014   Constipation 05/02/2014   History of colonic polyps 05/02/2014   GERD (gastroesophageal reflux disease) 02/24/2014   Hemoptysis 02/24/2014   Benign paroxysmal positional vertigo 04/18/2013   Insomnia 02/12/2013   Diverticulitis 12/21/2012   Nocturnal hypoxemia 12/12/2012   Fibromyalgia 12/28/2011   Osteopenia 08/14/2011   Hypothyroidism 04/06/2010   MAI (mycobacterium avium-intracellulare) (HCC) 11/19/2009   Hyperlipemia 07/03/2007   Essential hypertension 07/03/2007   CAD (coronary artery disease) s/p CABG 07/03/2007   Obstructive bronchiectasis (HCC) with GOLD II/III criteria 07/03/2007    Patient's Medications  New Prescriptions   No medications on file  Previous Medications   ACETAMINOPHEN (TYLENOL) 650 MG CR TABLET    Take 650 mg by mouth every 8 (eight) hours as needed for pain.   ALBUTEROL (PROAIR HFA) 108 (90 BASE) MCG/ACT INHALER    Inhale 2 puffs into the lungs every 6 (six) hours as needed for wheezing or shortness of breath.   ALPRAZOLAM (XANAX) 0.25 MG TABLET    TAKE 1 TABLET BY MOUTH AT BEDTIME AS NEEDED FOR SLEEP. May take very sparingly for daytime anxiety at least 8 hours from nighttime dose. Do not drive for 8 hours after taking   AMITRIPTYLINE (ELAVIL) 50 MG TABLET    Take 1 tablet (50 mg total) by mouth at bedtime.   AMLODIPINE (NORVASC) 5 MG TABLET    Take 1 tablet (5 mg total) by mouth daily.   ASPIRIN EC 81 MG TABLET    Take  81 mg by mouth daily.   ATORVASTATIN (LIPITOR) 80 MG TABLET    TAKE 1 TABLET(80 MG) BY MOUTH DAILY   CALCIUM CARBONATE (OS-CAL - DOSED IN MG OF ELEMENTAL CALCIUM) 1250 (500 CA) MG TABLET    Take 1 tablet by mouth daily.   CHOLECALCIFEROL (VITAMIN D3) 25 MCG (1000 UNIT) TABLET    Take 1,000 Units by mouth daily.   COENZYME Q10 (COQ10) 200 MG CAPS    Take 200 mg by mouth daily.   CYANOCOBALAMIN (VITAMIN B12) 1000 MCG TABLET    Take 1,000 mcg by mouth daily.   DOXYCYCLINE (ADOXA) 100 MG TABLET    Take 100 mg by mouth 2 (two) times daily.   LINEZOLID (ZYVOX) 600 MG TABLET    Take 1 tablet (600 mg total) by mouth 2 (two) times daily.   MELATONIN 5 MG TABS    Take 5 mg by mouth at bedtime.   METFORMIN (GLUCOPHAGE) 500 MG TABLET    TAKE 1 TABLET(500 MG) BY MOUTH TWICE DAILY WITH A MEAL   MOMETASONE-FORMOTEROL (DULERA) 200-5 MCG/ACT AERO    INHALE 2 PUFFS INTO THE LUNGS EVERY MORNING AND EVERY NIGHT AT BEDTIME   MULTIPLE VITAMIN (MULTIVITAMIN) TABLET    Take 1 tablet by mouth daily.   NA SULFATE-K SULFATE-MG SULF (SUPREP BOWEL PREP KIT) 17.5-3.13-1.6 GM/177ML SOLN    Take 1 kit by mouth as directed. For colonoscopy prep   NEBIVOLOL (BYSTOLIC) 2.5 MG TABLET  TAKE 1 TABLET(2.5 MG) BY MOUTH DAILY   ONDANSETRON (ZOFRAN) 4 MG TABLET    1 tablet by mouth 30 mins before drinking Suprep ( colonscopy preparation)   RESPIRATORY THERAPY SUPPLIES (FLUTTER) DEVI    Use as directed   SYNTHROID 75 MCG TABLET    TAKE 1 TABLET(75 MCG) BY MOUTH DAILY BEFORE AND BREAKFAST   TELMISARTAN (MICARDIS) 80 MG TABLET    TAKE 1 TABLET(80 MG) BY MOUTH DAILY  Modified Medications   No medications on file  Discontinued Medications   No medications on file    Subjective: 77 year old female with PMH of COPD/bronchiectasis, CAD, DM, HTN, HLD, GERD, hypothyroidism, Former smoker, history of MAI infection, shingles who is referred from the ED for left leg cellulitis.  She injured her left lower leg after falling off a ladder and had  a deep cut from the metal step of the ladder on August 3.  Reports getting tetanus shot 3 years ago and was told it would be good for 10 years. She started continuously bleeding for next 4 days.  She was seen by Dr. PCP at [facility] and was started on cephalexin on August 7.  She was seen again on August 14 due to no improvement and was started on doxycycline of which she completed 2 rounds.  She was sent to the ED on September 6 by her PCP due to concern for persistent cellulitis for IV antibiotics She received 1 dose of IV vancomycin.  X-ray of left lower extremity without any acute changes.  Patient was discharged on linezolid. Seen in the ED on 9/19 after seeing PCP on 9/18.  she received 1 dose of dalbavancin in the ED. MRI was negative for osteomyelitis.  She was also seen by ID.  She reports she still has soreness in the left lower leg but erythema seems to have slowly improving since it started.  She is able to walk although somewhat uncomfortable.  Denies any fever, chills.  Denies any nausea vomiting or diarrhea.  Stays alone.  Denies any smoking, alcohol and IVDU.  She reports history of bronchiectasis and follows Dr. Sherene Sires and history of MAC in the past.  She is taking azithromycin 250 Mg p.o. daily for a long time now.  Review of Systems: All systems reviewed with pertinent positive and negative as listed above  Past Medical History:  Diagnosis Date   Bronchiectasis    oxygen at night in the past   CAD (coronary artery disease)    Cellulitis    COPD (chronic obstructive pulmonary disease) (HCC) bronchiectasis   Diabetes mellitus without complication (HCC) pre-diabetes   Diverticulitis 01/26/2012   GERD (gastroesophageal reflux disease)    History of shingles 04/25/2012   HTN (hypertension)    Hyperlipidemia    Hypothyroidism    MAI (mycobacterium avium-intracellulare) (HCC)    Neuritis of upper extremity    Substance abuse (HCC)    Past Surgical History:  Procedure  Laterality Date   COLONOSCOPY WITH PROPOFOL N/A 01/21/2022   Procedure: COLONOSCOPY WITH PROPOFOL;  Surgeon: Lemar Lofty., MD;  Location: [facility];  Service: Gastroenterology;  Laterality: N/A;   CORONARY ARTERY BYPASS GRAFT  01/25/2002   x3 CABG   ENDOSCOPIC MUCOSAL RESECTION N/A 01/21/2022   Procedure: ENDOSCOPIC MUCOSAL RESECTION;  Surgeon: Meridee Score Netty Starring., MD;  Location: [facility];  Service: Gastroenterology;  Laterality: N/A;   HAND SURGERY     Dr. Amanda Pea sept 2022- improved hand movement   HEMOSTASIS CLIP PLACEMENT  01/21/2022   Procedure: HEMOSTASIS CLIP PLACEMENT;  Surgeon: Lemar Lofty., MD;  Location: [facility];  Service: Gastroenterology;;   HOT HEMOSTASIS N/A 01/21/2022   Procedure: HOT HEMOSTASIS (ARGON PLASMA COAGULATION/BICAP);  Surgeon: Lemar Lofty., MD;  Location: [facility];  Service: Gastroenterology;  Laterality: N/A;   POLYPECTOMY  01/21/2022   Procedure: POLYPECTOMY;  Surgeon: Mansouraty, Netty Starring., MD;  Location: [facility];  Service: Gastroenterology;;   Sunnie Nielsen LIFTING INJECTION  01/21/2022   Procedure: SUBMUCOSAL LIFTING INJECTION;  Surgeon: Lemar Lofty., MD;  Location: [facility];  Service: Gastroenterology;;    Social History   Tobacco Use   Smoking status: Former    Current packs/day: 0.00    Average packs/day: 1 pack/day for 25.0 years (25.0 ttl pk-yrs)    Types: Cigarettes    Start date: 01/26/1955    Quit date: 01/26/1980    Years since quitting: 42.7   Smokeless tobacco: Never  Substance Use Topics   Alcohol use: No   Drug use: No    Family History  Problem Relation Age of Onset   Colon cancer Mother    Hyperlipidemia Mother    Hypertension Mother    Heart disease Mother    Stroke Mother    Diabetes Mother    Cancer Mother    Colon cancer Father    Arthritis Father    Hyperlipidemia Father    Hypertension Father    Heart disease Father    Stroke Father    Cancer  Father    Vision loss Father    Colon cancer Maternal Grandmother    Cancer Maternal Grandmother    Atopy Neg Hx    Stomach cancer Neg Hx    Esophageal cancer Neg Hx    Pancreatic cancer Neg Hx    Inflammatory bowel disease Neg Hx    Liver disease Neg Hx    Rectal cancer Neg Hx     Allergies  Allergen Reactions   Bactrim [Sulfamethoxazole-Trimethoprim] Hives, Itching and Other (See Comments)    Bruised like areas on body   Ciprofloxacin Other (See Comments)    Body aches   Codeine Nausea Only    REACTION: nausea   Erythromycin Nausea Only    REACTION: nausea   Levaquin [Levofloxacin] Other (See Comments)    REACTION: aches    Health Maintenance  Topic Date Due   INFLUENZA VACCINE  08/26/2022   COVID-19 Vaccine (7 - 2023-24 season) 09/26/2022   OPHTHALMOLOGY EXAM  10/15/2022   HEMOGLOBIN A1C  12/30/2022   MAMMOGRAM  03/20/2023   Diabetic kidney evaluation - Urine ACR  06/30/2023   FOOT EXAM  06/30/2023   Medicare Annual Wellness (AWV)  07/12/2023   Diabetic kidney evaluation - eGFR measurement  10/14/2023   Colonoscopy  01/21/2025   DTaP/Tdap/Td (2 - Tdap) 05/14/2029   Pneumonia Vaccine 49+ Years old  Completed   DEXA SCAN  Completed   Hepatitis C Screening  Completed   Zoster Vaccines- Shingrix  Completed   HPV VACCINES  Aged Out    Objective: BP 126/81   Pulse 80   Temp 98.3 F (36.8 C) (Oral)   Wt 112 lb (50.8 kg)   SpO2 96%   BMI 21.16 kg/m    Physical Exam Constitutional:      Appearance: Normal appearance.  HENT:     Head: Normocephalic and atraumatic.      Mouth: Mucous membranes are moist.  Eyes:    Conjunctiva/sclera: Conjunctivae normal.     Pupils:  Pupils are equal, round, and bilaterally symmetrical   Cardiovascular:     Rate and Rhythm: Normal rate and regular rhythm.     Heart sounds: s1s2  Pulmonary:     Effort: Pulmonary effort is normal.     Breath sounds: Normal breath sounds.   Abdominal:     General: Non distended      Palpations: soft.   Musculoskeletal:        General: Normal range of motion.   Skin:    General: Skin is warm and dry.     Comments:  Left lower leg wound - seems to be healing but not completely resolved. No crepitus or fluctuance. Good ROM of ankle.   Neurological:     General: grossly non focal     Mental Status: awake, alert and oriented to person, place, and time.   Psychiatric:        Mood and Affect: Mood normal.   Lab Results Lab Results  Component Value Date   WBC 7.2 10/14/2022   HGB 13.6 10/14/2022   HCT 41.7 10/14/2022   MCV 97.0 10/14/2022   PLT 265 10/14/2022    Lab Results  Component Value Date   CREATININE 0.77 10/14/2022   BUN 15 10/14/2022   NA 137 10/14/2022   K 4.1 10/14/2022   CL 98 10/14/2022   CO2 28 10/14/2022    Lab Results  Component Value Date   ALT 28 10/14/2022   AST 33 10/14/2022   ALKPHOS 76 10/14/2022   BILITOT 0.9 10/14/2022    Lab Results  Component Value Date   CHOL 127 06/30/2022   HDL 65.30 06/30/2022   LDLCALC 43 06/30/2022   LDLDIRECT 51.0 11/28/2020   TRIG 90.0 06/30/2022   CHOLHDL 2 06/30/2022   No results found for: "LABRPR", "RPRTITER" No results found for: "HIV1RNAQUANT", "HIV1RNAVL", "CD4TABS"   Microbiology Results for orders placed or performed during the hospital encounter of 04/17/15  Culture, blood (routine x 2) Call MD if unable to obtain prior to antibiotics being given     Status: None   Collection Time: 04/17/15  9:57 PM   Specimen: BLOOD  Result Value Ref Range Status   Specimen Description BLOOD LEFT ANTECUBITAL  Final   Special Requests   Final    BOTTLES DRAWN AEROBIC AND ANAEROBIC 5CC EA IMMUNOCOMPROMISED   Culture   Final    NO GROWTH 5 DAYS Performed at [facility]    Report Status 04/23/2015 FINAL  Final  Culture, blood (routine x 2) Call MD if unable to obtain prior to antibiotics being given     Status: None   Collection Time: 04/17/15  9:58 PM   Specimen: BLOOD  Result  Value Ref Range Status   Specimen Description BLOOD R ARM  Final   Special Requests   Final    BOTTLES DRAWN AEROBIC AND ANAEROBIC 10CC EA IMMUNOCOMPROMISED   Culture   Final    NO GROWTH 5 DAYS Performed at [facility]    Report Status 04/23/2015 FINAL  Final   MR TIBIA FIBULA LEFT W WO CONTRAST  Result Date: 10/14/2022 CLINICAL DATA:  Left anterior lower leg laceration after falling off a ladder 3 weeks ago. EXAM: MRI OF LOWER LEFT EXTREMITY WITHOUT AND WITH CONTRAST TECHNIQUE: Multiplanar, multisequence MR imaging of the left lower leg was performed both before and after administration of intravenous contrast. CONTRAST:  5mL GADAVIST GADOBUTROL 1 MMOL/ML IV SOLN COMPARISON:  Left tibia and fibula x-rays from  same day. FINDINGS: Bones/Joint/Cartilage Thin periosteal edema and enhancement along the anteromedial aspect of the distal tibial diaphysis (series 18, image 42). No underlying marrow signal abnormality. Muscles and Tendons Intact.  No muscle edema or atrophy. Soft tissue Mild circumferential soft tissue swelling of the distal lower leg with focal skin thickening and enhancement anteriorly (series 18, image 42). No fluid collection. No soft tissue mass. IMPRESSION: 1. Mild circumferential soft tissue swelling of the distal lower leg with focal skin thickening and enhancement anteriorly, consistent with cellulitis. No abscess. 2. Thin periosteal edema and enhancement along the anteromedial aspect of the distal tibial diaphysis, likely reactive. No definite osteomyelitis at this time. Electronically Signed   By: Obie Dredge M.D.   On: 10/14/2022 18:09   DG Tibia/Fibula Left  Result Date: 10/14/2022 CLINICAL DATA:  Infection. Left lower leg and ankle cellulitis for 6 weeks. EXAM: LEFT TIBIA AND FIBULA - 2 VIEW COMPARISON:  Left tibia and fibula radiographs 10/01/2022 FINDINGS: There is again diffuse decreased bone mineralization. The cortices appear intact. No acute fracture or  dislocation. No subcutaneous air. IMPRESSION: 1. No significant change from prior.  No cortical erosion. 2. Diffuse decreased bone mineralization. Electronically Signed   By: Neita Garnet M.D.   On: 10/14/2022 12:25   DG Tibia/Fibula Left  Result Date: 10/01/2022 CLINICAL DATA:  Increasing pain and redness. EXAM: LEFT TIBIA AND FIBULA - 2 VIEW COMPARISON:  None Available. FINDINGS: There is no evidence of fracture or other focal bone lesions. Soft tissues are unremarkable. Osteopenia. IMPRESSION: Osteopenia.  No acute osseous abnormality. Electronically Signed   By: Karen Kays M.D.   On: 10/01/2022 17:15    Assessment/Plan # Left lower leg cellulitis/wound  - in the setting of traumatic injury - s/p several course of abtx as above, IV dalbavancin on 9/19 - MRI 9/19 negative for osteomyelitis  - No need for more abtx, seems to be improving although a bit slow.  - She has an appt with wound care center 10/2 which I think is next step for assisting wound healing  - Fu as needed with [place] or any worsening symptoms or signs  I have personally spent 65 minutes involved in face-to-face and non-face-to-face activities for this patient on the day of the visit. Professional time spent includes the following activities: Preparing to see the patient (review of tests), Obtaining and/or reviewing separately obtained history (admission/discharge record), Performing a medically appropriate examination and/or evaluation , Ordering medications/tests/procedures, referring and communicating with other health care professionals, Documenting clinical information in the EMR, Independently interpreting results (not separately reported), Communicating results to the patient/family/caregiver, Counseling and educating the patient/family/caregiver and Care coordination (not separately reported).   Victoriano Lain, MD [facility] 10/21/2022, 8:43 AM

## 2022-10-25 ENCOUNTER — Other Ambulatory Visit: Payer: Self-pay | Admitting: Cardiology

## 2022-10-27 ENCOUNTER — Other Ambulatory Visit: Payer: Self-pay

## 2022-10-27 ENCOUNTER — Encounter (HOSPITAL_BASED_OUTPATIENT_CLINIC_OR_DEPARTMENT_OTHER): Payer: Medicare Other | Attending: Physician Assistant | Admitting: Physician Assistant

## 2022-10-27 DIAGNOSIS — L97822 Non-pressure chronic ulcer of other part of left lower leg with fat layer exposed: Secondary | ICD-10-CM | POA: Insufficient documentation

## 2022-10-27 DIAGNOSIS — E11622 Type 2 diabetes mellitus with other skin ulcer: Secondary | ICD-10-CM | POA: Diagnosis not present

## 2022-10-27 DIAGNOSIS — J449 Chronic obstructive pulmonary disease, unspecified: Secondary | ICD-10-CM | POA: Diagnosis not present

## 2022-10-27 DIAGNOSIS — C44729 Squamous cell carcinoma of skin of left lower limb, including hip: Secondary | ICD-10-CM | POA: Diagnosis not present

## 2022-10-27 DIAGNOSIS — L03116 Cellulitis of left lower limb: Secondary | ICD-10-CM | POA: Insufficient documentation

## 2022-10-27 DIAGNOSIS — I10 Essential (primary) hypertension: Secondary | ICD-10-CM | POA: Insufficient documentation

## 2022-10-28 ENCOUNTER — Other Ambulatory Visit: Payer: Self-pay | Admitting: Internal Medicine

## 2022-10-28 ENCOUNTER — Ambulatory Visit: Payer: Medicare Other | Admitting: Internal Medicine

## 2022-10-28 LAB — DERMATOLOGY PATHOLOGY

## 2022-10-28 NOTE — Progress Notes (Signed)
[name], FERRISS Day ([id number]) [id number] Page 1 of 7 Visit Report for 10/27/2022 Allergy List Details Patient Name: Date of Service: Belinda Day 10/27/2022 12:30 PM Medical Record Number: [id number] Patient Account Number: [id number] Date of Birth/Sex: Treating RN: August 17, 1945 (77 y.o. Arta Silence Primary Care Muneeb Veras: Tana Conch Other Clinician: Referring Joseline Mccampbell: Treating Keaundre Thelin/Extender: Brunetta Jeans in Treatment: 0 Allergies Active Allergies codeine Bactrim Cipro Levaquin Allergy Notes Electronic Signature(s) Signed: 10/28/2022 4:51:22 PM By: Redmond Pulling RN, BSN Entered By: Redmond Pulling on 10/27/2022 09:55:02 -------------------------------------------------------------------------------- Arrival Information Details Patient Name: Date of Service: Belinda Day. 10/27/2022 12:30 PM Medical Record Number: [id number] Patient Account Number: [id number] Date of Birth/Sex: Treating RN: Sep 29, 1945 (77 y.o. Orville Govern Primary Care Halley Shepheard: Tana Conch Other Clinician: Referring Askari Kinley: Treating Ademola Vert/Extender: Brunetta Jeans in Treatment: 0 Visit Information Patient Arrived: Ambulatory Arrival Time: 12:51 Accompanied By: self Transfer Assistance: None Patient Identification Verified: Yes Secondary Verification Process Completed: Yes Patient Has Alerts: Yes Patient Alerts: Patient on Blood Thinner aspirin 81mg  No ABI- Very painful. Electronic Signature(s) Signed: 10/27/2022 5:54:54 PM By: Karie Schwalbe RN Entered By: Karie Schwalbe on 10/27/2022 10:28:46 Brayton Layman Day ([id number]) [id number] Page 2 of 7 -------------------------------------------------------------------------------- Clinic Level of Care Assessment Details Patient Name: Date of Service: Belinda Day 10/27/2022 12:30 PM Medical Record Number:  [id number] Patient Account Number: [id number] Date of Birth/Sex: Treating RN: 16-Oct-1945 (77 y.o. Katrinka Blazing Primary Care Whitni Pasquini: Tana Conch Other Clinician: Referring Rawlins Stuard: Treating Arva Slaugh/Extender: Brunetta Jeans in Treatment: 0 Clinic Level of Care Assessment Items TOOL 1 Quantity Score X- 1 0 Use when EandM and Procedure is performed on INITIAL visit ASSESSMENTS - Nursing Assessment / Reassessment X- 1 20 General Physical Exam (combine w/ comprehensive assessment (listed just below) when performed on new pt. evals) X- 1 25 Comprehensive Assessment (HX, ROS, Risk Assessments, Wounds Hx, etc.) ASSESSMENTS - Wound and Skin Assessment / Reassessment X- 1 10 Dermatologic / Skin Assessment (not related to wound area) ASSESSMENTS - Ostomy and/or Continence Assessment and Care []  - 0 Incontinence Assessment and Management []  - 0 Ostomy Care Assessment and Management (repouching, etc.) PROCESS - Coordination of Care X - Simple Patient / Family Education for ongoing care 1 15 []  - 0 Complex (extensive) Patient / Family Education for ongoing care X- 1 10 Staff obtains Chiropractor, Records, T Results / Process Orders est X- 1 10 Staff telephones HHA, Nursing Homes / Clarify orders / etc []  - 0 Routine Transfer to another Facility (non-emergent condition) []  - 0 Routine Hospital Admission (non-emergent condition) X- 1 15 New Admissions / Manufacturing engineer / Ordering NPWT Apligraf, etc. , []  - 0 Emergency Hospital Admission (emergent condition) PROCESS - Special Needs []  - 0 Pediatric / Minor Patient Management []  - 0 Isolation Patient Management []  - 0 Hearing / Language / Visual special needs []  - 0 Assessment of Community assistance (transportation, D/C planning, etc.) []  - 0 Additional assistance / Altered mentation []  - 0 Support Surface(s) Assessment (bed, cushion, seat, etc.) INTERVENTIONS - Miscellaneous []  -  0 External ear exam []  - 0 Patient Transfer (multiple staff / Nurse, adult / Similar devices) []  - 0 Simple Staple / Suture removal (25 or less) []  - 0 Complex Staple / Suture removal (26 or more) []  - 0 Hypo/Hyperglycemic Management (do not check if billed separately) []  - 0 Ankle / Brachial Index (ABI) - do not  check if billed separately Has the patient been seen at the hospital within the last three years: Yes Total Score: 105 Level Of Care: New/Established - Level 3 Electronic Signature(s) Signed: 10/27/2022 5:54:54 PM By: Karie Schwalbe RN Constance Holster, Trinia Day ([id number]) PM By: Karie Schwalbe RN [phone].pdf Page 3 of 7 Signed: 10/27/2022 5:54:54 Entered By: Karie Schwalbe on 10/27/2022 11:54:55 -------------------------------------------------------------------------------- Encounter Discharge Information Details Patient Name: Date of Service: Belinda Day 10/27/2022 12:30 PM Medical Record Number: [id number] Patient Account Number: [id number] Date of Birth/Sex: Treating RN: 1945-03-13 (77 y.o. Katrinka Blazing Primary Care Alyssia Heese: Tana Conch Other Clinician: Referring Halee Glynn: Treating Samy Ryner/Extender: Brunetta Jeans in Treatment: 0 Encounter Discharge Information Items Post Procedure Vitals Discharge Condition: Stable Temperature (F): 98.7 Ambulatory Status: Ambulatory Pulse (bpm): 61 Discharge Destination: Home Respiratory Rate (breaths/min): 18 Transportation: Private Auto Blood Pressure (mmHg): 137/72 Accompanied By: friend Schedule Follow-up Appointment: Yes Clinical Summary of Care: Patient Declined Electronic Signature(s) Signed: 10/27/2022 5:54:54 PM By: Karie Schwalbe RN Entered By: Karie Schwalbe on 10/27/2022 14:49:35 -------------------------------------------------------------------------------- Lower Extremity Assessment Details Patient Name: Date of Service: Belinda Day.  10/27/2022 12:30 PM Medical Record Number: [id number] Patient Account Number: [id number] Date of Birth/Sex: Treating RN: 1945-08-29 (77 y.o. Orville Govern Primary Care Bradly Sangiovanni: Tana Conch Other Clinician: Referring Varie Machamer: Treating Ines Warf/Extender: Brunetta Jeans in Treatment: 0 Edema Assessment Assessed: Kyra Searles: Yes] [Right: No] [Left: Edema] [Right: :] Calf Left: Right: Point of Measurement: From Medial Instep 32.2 cm Ankle Left: Right: Point of Measurement: From Medial Instep 19.8 cm Vascular Assessment Pulses: Dorsalis Pedis Palpable: [Left:Yes] Extremity colors, hair growth, and conditions: Extremity Color: [Left:Normal] Hair Growth on Extremity: [Left:Yes] Temperature of Extremity: [Left:Warm] Capillary Refill: [Left:< 3 seconds] Dependent Rubor: [Left:No] Rogan, Mckenley Day ([id number]) [Right:[id number] Page 4 of 7] Blanched when Elevated: [Left:No No] Toe Nail Assessment Left: Right: Thick: No Discolored: No Deformed: No Improper Length and Hygiene: No Electronic Signature(s) Signed: 10/27/2022 5:54:54 PM By: Karie Schwalbe RN Signed: 10/28/2022 4:51:22 PM By: Redmond Pulling RN, BSN Entered By: Karie Schwalbe on 10/27/2022 10:24:34 -------------------------------------------------------------------------------- Multi-Disciplinary Care Plan Details Patient Name: Date of Service: Belinda Day. 10/27/2022 12:30 PM Medical Record Number: [id number] Patient Account Number: [id number] Date of Birth/Sex: Treating RN: 08-Dec-1945 (77 y.o. Katrinka Blazing Primary Care Nazyia Gaugh: Tana Conch Other Clinician: Referring Demorris Choyce: Treating Emmauel Hallums/Extender: Brunetta Jeans in Treatment: 0 Active Inactive Wound/Skin Impairment Nursing Diagnoses: Impaired tissue integrity Goals: Patient/caregiver will verbalize understanding of skin care regimen Date Initiated: 10/27/2022 Target  Resolution Date: 01/25/2023 Goal Status: Active Interventions: Assess ulceration(s) every visit Treatment Activities: Skin care regimen initiated : 10/27/2022 Notes: Electronic Signature(s) Signed: 10/27/2022 5:54:54 PM By: Karie Schwalbe RN Entered By: Karie Schwalbe on 10/27/2022 11:53:16 -------------------------------------------------------------------------------- Pain Assessment Details Patient Name: Date of Service: Belinda Day. 10/27/2022 12:30 PM Medical Record Number: [id number] Patient Account Number: [id number] Date of Birth/Sex: Treating RN: [birth year] (77 y.o. Orville Govern Primary Care Vanetta Rule: Tana Conch Other Clinician: Referring Analyn Matusek: Treating Taft Worthing/Extender: Carlynn Herald, Elisabeth Cara in Treatment: 0 Javanna, Patin Maxwell Day ([id number]) [id number] Page 5 of 7 Active Problems Location of Pain Severity and Description of Pain Patient Has Paino Yes Site Locations Rate the pain. Current Pain Level: 8 Least Pain Level: 5 Character of Pain Describe the Pain: Tender Pain Management and Medication Current Pain Management: Electronic Signature(s) Signed: 10/27/2022 5:54:54 PM By: Karie Schwalbe RN Signed: 10/28/2022 4:51:22 PM By:  Redmond Pulling RN, BSN Entered By: Karie Schwalbe on 10/27/2022 10:13:56 -------------------------------------------------------------------------------- Patient/Caregiver Education Details Patient Name: Date of Service: Jorge Ny 10/2/2024andnbsp12:30 PM Medical Record Number: [id number] Patient Account Number: [id number] Date of Birth/Gender: Treating RN: [birth year] (77 y.o. Katrinka Blazing Primary Care Physician: Tana Conch Other Clinician: Referring Physician: Treating Physician/Extender: Brunetta Jeans in Treatment: 0 Education Assessment Education Provided To: Patient Education Topics Provided Wound/Skin Impairment: Methods:  Explain/Verbal Responses: State content correctly Electronic Signature(s) Signed: 10/27/2022 5:54:54 PM By: Karie Schwalbe RN Entered By: Karie Schwalbe on 10/27/2022 11:53:41 Brayton Layman Day ([id number]) [id number] Page 6 of 7 -------------------------------------------------------------------------------- Wound Assessment Details Patient Name: Date of Service: RAKAYLA, RICKLEFS 10/27/2022 12:30 PM Medical Record Number: [id number] Patient Account Number: [id number] Date of Birth/Sex: Treating RN: May 20, 1945 (77 y.o. Orville Govern Primary Care Maykayla Highley: Tana Conch Other Clinician: Referring Dilcia Rybarczyk: Treating Catie Chiao/Extender: Brunetta Jeans in Treatment: 0 Wound Status Wound Number: 1 Primary Cellulitis Etiology: Wound Location: Left, Anterior Lower Leg Secondary Diabetic Wound/Ulcer of the Lower Extremity Wounding Event: Skin Tear/Laceration Etiology: Date Acquired: 08/28/2022 Wound Open Weeks Of Treatment: 0 Status: Clustered Wound: No Comorbid Chronic Obstructive Pulmonary Disease (COPD), Coronary History: Artery Disease, Hypertension, Type II Diabetes Photos Wound Measurements Length: (cm) 1 Width: (cm) 1 Depth: (cm) 0.1 Area: (cm) 0.785 Volume: (cm) 0.079 % Reduction in Area: % Reduction in Volume: Epithelialization: None Tunneling: No Undermining: No Wound Description Classification: Full Thickness Without Exposed Support Exudate Amount: Small Exudate Type: Serous Exudate Color: amber Structures Foul Odor After Cleansing: No Slough/Fibrino Yes Wound Bed Granulation Amount: None Present (0%) Exposed Structure Necrotic Amount: Large (67-100%) Fascia Exposed: No Necrotic Quality: Adherent Slough Fat Layer (Subcutaneous Tissue) Exposed: Yes Tendon Exposed: No Muscle Exposed: No Joint Exposed: No Bone Exposed: No Periwound Skin Texture Texture Color No Abnormalities Noted: No No Abnormalities  Noted: No Scarring: Yes Moisture No Abnormalities Noted: No Treatment Notes Wound #1 (Lower Leg) Wound Laterality: Left, Anterior Cleanser Soap and Water AREEN, TRAUTNER Day ([id number]) [id number] Page 7 of 7 Discharge Instruction: May shower and wash wound with dial antibacterial soap and water prior to dressing change. Vashe 5.8 (oz) Discharge Instruction: Cleanse the wound with Vashe prior to applying Day clean dressing using gauze sponges, not tissue or cotton balls. Peri-Wound Care Topical Primary Dressing Hydrofera Blue Ready Transfer Foam, 2.5x2.5 (in/in) Discharge Instruction: Apply directly to wound bed as directed Secondary Dressing Bordered Gauze, 2x3.75 in Discharge Instruction: Apply over primary dressing as directed. Secured With Compression Wrap Compression Stockings Facilities manager) Signed: 10/27/2022 5:54:54 PM By: Karie Schwalbe RN Signed: 10/28/2022 4:51:22 PM By: Redmond Pulling RN, BSN Entered By: Karie Schwalbe on 10/27/2022 10:50:21 -------------------------------------------------------------------------------- Vitals Details Patient Name: Date of Service: Belinda Day. 10/27/2022 12:30 PM Medical Record Number: [id number] Patient Account Number: [id number] Date of Birth/Sex: Treating RN: May 15, 1945 (77 y.o. Orville Govern Primary Care Delsa Walder: Tana Conch Other Clinician: Referring Lakeidra Reliford: Treating Miamor Ayler/Extender: Brunetta Jeans in Treatment: 0 Vital Signs Time Taken: 12:54 Temperature (F): 98.7 Height (in): 61 Pulse (bpm): 61 Source: Stated Respiratory Rate (breaths/min): 18 Weight (lbs): 113 Blood Pressure (mmHg): 137/72 Source: Stated Reference Range: 80 - 120 mg / dl Body Mass Index (BMI): 21.3 Electronic Signature(s) Signed: 10/28/2022 4:51:22 PM By: Redmond Pulling RN, BSN Entered By: Redmond Pulling on 10/27/2022 09:54:47

## 2022-10-28 NOTE — Progress Notes (Signed)
MAO, LOCKNER A ([id number]) [id number] Page 1 of 4 Visit Report for 10/27/2022 Abuse Risk Screen Details Patient Name: Date of Service: Belinda Day, Belinda Day 10/27/2022 12:30 PM Medical Record Number: [id number] Patient Account Number: [id number] Date of Birth/Sex: Treating RN: 1945-03-08 (77 y.o. Belinda Day Primary Care Kailo Kosik: Tana Conch Other Clinician: Referring Finlay Mills: Treating Semiah Konczal/Extender: Brunetta Jeans in Treatment: 0 Abuse Risk Screen Items Answer ABUSE RISK SCREEN: Has anyone close to you tried to hurt or harm you recentlyo No Do you feel uncomfortable with anyone in your familyo No Has anyone forced you do things that you didnt want to doo No Electronic Signature(s) Signed: 10/28/2022 4:51:22 PM By: Redmond Pulling RN, BSN Entered By: Redmond Pulling on 10/27/2022 10:01:44 -------------------------------------------------------------------------------- Activities of Daily Living Details Patient Name: Date of Service: ADDA, Belinda A. 10/27/2022 12:30 PM Medical Record Number: [id number] Patient Account Number: [id number] Date of Birth/Sex: Treating RN: [birth year] (77 y.o. Belinda Day Primary Care Lucero Auzenne: Tana Conch Other Clinician: Referring Amro Winebarger: Treating Felma Pfefferle/Extender: Brunetta Jeans in Treatment: 0 Activities of Daily Living Items Answer Activities of Daily Living (Please select one for each item) Drive Automobile Completely Able T Medications ake Completely Able Use T elephone Completely Able Care for Appearance Completely Able Use T oilet Completely Able Bath / Shower Completely Able Dress Self Completely Able Feed Self Completely Able Walk Completely Able Get In / Out Bed Completely Able Housework Completely Able Prepare Meals Completely Able Handle Money Completely Able Shop for Self Completely Able Electronic Signature(s) Signed:  10/28/2022 4:51:22 PM By: Redmond Pulling RN, BSN Entered By: Redmond Pulling on 10/27/2022 10:02:10 [name] ([id number]) [phone] [id number] Page 2 of 4 -------------------------------------------------------------------------------- Education Screening Details Patient Name: Date of Service: Belinda Day 10/27/2022 12:30 PM Medical Record Number: [id number] Patient Account Number: [id number] Date of Birth/Sex: Treating RN: October 28, 1945 (77 y.o. Belinda Day Primary Care Chrisette Man: Tana Conch Other Clinician: Referring Audrionna Lampton: Treating Shirlene Andaya/Extender: Brunetta Jeans in Treatment: 0 Primary Learner Assessed: Patient Learning Preferences/Education Level/Primary Language Learning Preference: Explanation, Demonstration, Printed Material Preferred Language: English Cognitive Barrier Language Barrier: No Translator Needed: No Memory Deficit: No Emotional Barrier: No Cultural/Religious Beliefs Affecting Medical Care: No Physical Barrier Impaired Vision: No Impaired Hearing: No Decreased Hand dexterity: No Knowledge/Comprehension Knowledge Level: High Comprehension Level: High Ability to understand written instructions: High Ability to understand verbal instructions: High Motivation Anxiety Level: Calm Cooperation: Cooperative Education Importance: Acknowledges Need Interest in Health Problems: Asks Questions Perception: Coherent Willingness to Engage in Self-Management High Activities: Readiness to Engage in Self-Management High Activities: Electronic Signature(s) Signed: 10/28/2022 4:51:22 PM By: Redmond Pulling RN, BSN Entered By: Redmond Pulling on 10/27/2022 10:02:35 -------------------------------------------------------------------------------- Fall Risk Assessment Details Patient Name: Date of Service: Belinda Pluck RO N A. 10/27/2022 12:30 PM Medical Record Number: [id number] Patient Account Number:  [id number] Date of Birth/Sex: Treating RN: Jun 06, 1945 (77 y.o. Belinda Day Primary Care Moet Mikulski: Tana Conch Other Clinician: Referring Kodie Pick: Treating Liyana Suniga/Extender: Brunetta Jeans in Treatment: 0 Fall Risk Assessment Items Have you had 2 or more falls in the last 12 monthso 0 No Have you had any fall that resulted in injury in the last 12 monthso 0 Yes Currin, Modena A ([id number]) [phone] [id number] Page 3 of 4 FALLS RISK SCREEN History of falling - immediate or within 3 months 25 Yes Secondary diagnosis (Do you have 2 or more medical diagnoseso) 0 No  Ambulatory aid None/bed rest/wheelchair/nurse 0 Yes Crutches/cane/walker 0 No Furniture 0 No Intravenous therapy Access/Saline/Heparin Lock 0 No Gait/Transferring Normal/ bed rest/ wheelchair 0 Yes Weak (short steps with or without shuffle, stooped but able to lift head while walking, may seek 0 No support from furniture) Impaired (short steps with shuffle, may have difficulty arising from chair, head down, impaired 0 No balance) Mental Status Oriented to own ability 0 Yes Electronic Signature(s) Signed: 10/28/2022 4:51:22 PM By: Redmond Pulling RN, BSN Entered By: Redmond Pulling on 10/27/2022 10:03:42 -------------------------------------------------------------------------------- Foot Assessment Details Patient Name: Date of Service: Belinda Pluck RO N A. 10/27/2022 12:30 PM Medical Record Number: [id number] Patient Account Number: [id number] Date of Birth/Sex: Treating RN: 05-18-1945 (77 y.o. Belinda Day Primary Care Zyara Riling: Tana Conch Other Clinician: Referring Rayansh Herbst: Treating Sarrah Fiorenza/Extender: Brunetta Jeans in Treatment: 0 Foot Assessment Items Site Locations + = Sensation present, - = Sensation absent, C = Callus, U = Ulcer R = Redness, W = Warmth, M = Maceration, PU = Pre-ulcerative lesion F = Fissure, S = Swelling,  D = Dryness Assessment Right: Left: Other Deformity: No No Prior Foot Ulcer: No No Prior Amputation: No No Charcot Joint: No No Ambulatory Status: Ambulatory Without Help GaitSTORMEY, WILBORN A ([id number]) [phone] [id number] Page 4 of 4 Electronic Signature(s) Signed: 10/28/2022 4:51:22 PM By: Redmond Pulling RN, BSN Entered By: Redmond Pulling on 10/27/2022 10:06:34 -------------------------------------------------------------------------------- Nutrition Risk Screening Details Patient Name: Date of Service: Belinda Pluck RO N A. 10/27/2022 12:30 PM Medical Record Number: [id number] Patient Account Number: [id number] Date of Birth/Sex: Treating RN: 23-Oct-1945 (77 y.o. Belinda Day Primary Care Octavia Mottola: Tana Conch Other Clinician: Referring Mendi Constable: Treating Shyne Lehrke/Extender: Brunetta Jeans in Treatment: 0 Height (in): 61 Weight (lbs): 113 Body Mass Index (BMI): 21.3 Nutrition Risk Screening Items Score Screening NUTRITION RISK SCREEN: I have an illness or condition that made me change the kind and/or amount of food I eat 2 Yes I eat fewer than two meals per day 3 Yes I eat few fruits and vegetables, or milk products 2 Yes I have three or more drinks of beer, liquor or wine almost every day 2 Yes I have tooth or mouth problems that make it hard for me to eat 2 Yes I don't always have enough money to buy the food I need 4 Yes I eat alone most of the time 1 Yes I take three or more different prescribed or over-the-counter drugs a day 1 Yes Without wanting to, I have lost or gained 10 pounds in the last six months 2 Yes I am not always physically able to shop, cook and/or feed myself 2 Yes Nutrition Protocols Good Risk Protocol Moderate Risk Protocol High Risk Proctocol Risk Level: High Risk Score: 21 Electronic Signature(s) Signed: 10/28/2022 4:51:22 PM By: Redmond Pulling RN, BSN Entered By: Redmond Pulling on  10/27/2022 10:04:22

## 2022-10-28 NOTE — Progress Notes (Signed)
SHEYANN, SULTON Day ([id number]) [id number] Page 1 of 9 Visit Report for 10/27/2022 Biopsy Details Patient Name: Date of Service: Belinda, Day 10/27/2022 12:30 PM Medical Record Number: [id number] Patient Account Number: [id number] Date of Birth/Sex: Treating RN: October 30, 1945 (77 y.o. Katrinka Blazing Primary Care Provider: Tana Conch Other Clinician: Referring Provider: Treating Provider/Extender: Brunetta Jeans in Treatment: 0 Biopsy Performed for: Wound #1 Left, Anterior Lower Leg The following information was scribed by: Karie Schwalbe The information was scribed for: Lenda Kelp Location(s): Wound Bed Performed By: Physician Lenda Kelp, PA Tissue Punch: Yes Size (mm): 5 Number of Specimens T aken: 1 Specimen Sent T Pathology: o Yes Level of Consciousness (Pre-procedure): Awake and Alert Pre-procedure Verification/Time-Out Taken: Yes - 13:55 Pain Control: Other Other Description: Xylocaine 1 % with Epinephrine Instrument: Forceps, Scissors Hemostasis Achieved: Pressure Procedural Pain: 0 Post Procedural Pain: 0 Response to Treatment: Procedure was tolerated well Level of Consciousness (Post-procedure): Awake and Alert Post Procedure Diagnosis Same as Pre-procedure Electronic Signature(s) Signed: 10/27/2022 5:35:47 PM By: Allen Derry PA-C Signed: 10/27/2022 5:54:54 PM By: Karie Schwalbe RN Entered By: Karie Schwalbe on 10/27/2022 11:14:01 -------------------------------------------------------------------------------- Chief Complaint Document Details Patient Name: Date of Service: [name] 10/27/2022 12:30 PM Medical Record Number: [id number] Patient Account Number: [id number] Date of Birth/Sex: Treating RN: 1945/10/07 (77 y.o. F) Primary Care Provider: Tana Conch Other Clinician: Referring Provider: Treating Provider/Extender: Brunetta Jeans in Treatment:  0 Information Obtained from: Patient Chief Complaint Left LE Ulcer Electronic Signature(s) Signed: 10/27/2022 1:47:06 PM By: Allen Derry PA-C Entered By: Allen Derry on 10/27/2022 10:47:06 Belinda Day ([id number]) [id number] Page 2 of 9 -------------------------------------------------------------------------------- HPI Details Patient Name: Date of Service: BRECKIN, ZAFAR 10/27/2022 12:30 PM Medical Record Number: [id number] Patient Account Number: [id number] Date of Birth/Sex: Treating RN: December 07, 1945 (77 y.o. F) Primary Care Provider: Tana Conch Other Clinician: Referring Provider: Treating Provider/Extender: Brunetta Jeans in Treatment: 0 History of Present Illness HPI Description: 10/27/2022 upon evaluation today patient presents for initial inspection here in the clinic regarding Day wound over the left anterior lower extremity. T back up Day little bit and gives some history I am going to recount what the patient brought in written out on her sheet with her today which is an o excellent history of the course of this wound. On August 3 the patient sustained an injury to the left leg due to Day fall off of Day ladder. This was essentially Day deep cut and continue to bleed for about 4 days. On August 7 she saw Dr. Lenn Cal at [facility] and was started on Keflex at that point. Subsequently on August 14 she went back to [facility] where she was placed on doxycycline 100 mg for 2 rounds. The next time that she was evaluated was actually about 3 weeks later when back at [facility] September 6 she was sent to the hospital due to what appeared to be worsening and started on vancomycin IV. September 7 she was actually discharged from the hospital with linezolid 600 mg. On September 18 she saw Dr. Anselm Jungling who recommended the patient go to [facility] long the next morning. On September 19 she saw infectious  disease and they ordered an MRI as well as putting the patient on Dalvance. This was by IV. The next appointment with infectious disease was September 26 at that point it was noted that the  patient had Day number MRI and the results of that were reported out on 10-14-2022 which showed that she had soft tissue swelling of the distal lower leg with focal skin thickening and enhancement anteriorly consistent with cellulitis no abscess. There was also Day thin periosteal edema and enhancement along the anterior medial aspect of the distal tibia this is stated to be likely reactive no definitive osteomyelitis noted. Subsequently she states that she did follow-up requesting to go to the wound center for further evaluation and was referred to [place] today October 2 for evaluation in the meantime she has been using Silvadene cream. Her most recent hemoglobin A1c was 6.6 she does have diabetes but otherwise no major medical problems other than hypertension and COPD that would affect her ability to heal. Electronic Signature(s) Signed: 10/27/2022 5:32:33 PM By: Allen Derry PA-C Entered By: Allen Derry on 10/27/2022 14:32:33 -------------------------------------------------------------------------------- Physical Exam Details Patient Name: Date of Service: [name] 10/27/2022 12:30 PM Medical Record Number: [id number] Patient Account Number: [id number] Date of Birth/Sex: Treating RN: 1945-03-13 (77 y.o. F) Primary Care Provider: Tana Conch Other Clinician: Referring Provider: Treating Provider/Extender: Brunetta Jeans in Treatment: 0 Constitutional sitting or standing blood pressure is within target range for patient.. pulse regular and within target range for patient.Marland Kitchen respirations regular, non-labored and within target range for patient.Marland Kitchen temperature within target range for patient.. Well-nourished and well-hydrated in no acute distress. Eyes conjunctiva clear no eyelid  edema noted. pupils equal round and reactive to light and accommodation. Ears, Nose, Mouth, and Throat no gross abnormality of ear auricles or external auditory canals. normal hearing noted during conversation. mucus membranes moist. Respiratory normal breathing without difficulty. Cardiovascular 2+ dorsalis pedis/posterior tibialis pulses. trace pitting edema of the bilateral lower extremities. Musculoskeletal normal gait and posture. no significant deformity or arthritic changes, no loss or range of motion, no clubbing. Psychiatric Belinda, SCHRIEVER Day ([id number]) [id number] Page 3 of 9 this patient is able to make decisions and demonstrates good insight into disease process. Alert and Oriented x 3. pleasant and cooperative. Notes Upon inspection patient's wound actually to be honest appears to be completely healed. This was somewhat of Day surprise to me I did not expect to find that on evaluation however there was Day bigger issue which did look to be apparent upon inspection and that that secondary to the trauma I feel like it may have activated Day skin cancer that was lying dormant in the area it appears on the medial aspect of the wound and this actually may be Day squamous or basal cell carcinoma. It is very characteristic of what I am seeing this type of thing happened before. She had maybe Day minimal amount of swelling but really nothing significant I do not believe this is venous in nature. Subsequently I discussed with the patient that I do feel like she probably needs to have Day biopsy of the area and she voiced understanding I actually did go ahead and after numbing her with 6 cc of lidocaine 1% with epinephrine performed Day 5 mm punch biopsy to send for evaluation and depending on the results of that we will make any additional recommendations for the patient as necessary. She voiced understanding and is in agreement with the plan. Electronic Signature(s) Signed:  10/27/2022 5:34:11 PM By: Allen Derry PA-C Entered By: Allen Derry on 10/27/2022 14:34:10 -------------------------------------------------------------------------------- Physician Orders Details Patient Name: Date of Service: [name] 10/27/2022 12:30 PM Medical Record Number:  [id number] Patient Account Number: [id number] Date of Birth/Sex: Treating RN: 09-Dec-1945 (77 y.o. Katrinka Blazing Primary Care Provider: Tana Conch Other Clinician: Referring Provider: Treating Provider/Extender: Brunetta Jeans in Treatment: 0 Verbal / Phone Orders: No Diagnosis Coding ICD-10 Coding Code Description [phone] Laceration without foreign body, left lower leg, initial encounter L03.116 Cellulitis of left lower limb E11.622 Type 2 diabetes mellitus with other skin ulcer L97.822 Non-pressure chronic ulcer of other part of left lower leg with fat layer exposed I10 Essential (primary) hypertension J44.89 Other specified chronic obstructive pulmonary disease Follow-up Appointments ppointment in 1 week. Allen Derry PA Wednesday room 7 11/03/22 at 9:45am Return Day Anesthetic (In clinic) Topical Lidocaine 5% applied to wound bed Bathing/ Shower/ Hygiene May shower and wash wound with soap and water. - Please protect After shower/bath please redress wound. Edema Control - Lymphedema / SCD / Other Left Lower Extremity Elevate legs to the level of the heart or above for 30 minutes daily and/or when sitting for 3-4 times Day day throughout the day. - When sitting , elevate legs. Avoid standing for long periods of time. Exercise regularly - As tolerated Additional Orders / Instructions Other: - Punch Biopsy sent to [facility] labs. (10/27/22) Wound Treatment Wound #1 - Lower Leg Wound Laterality: Left, Anterior Cleanser: Soap and Water 1 x Per Day/30 Days Discharge Instructions: May shower and wash wound with dial antibacterial soap and water prior to dressing  change. Cleanser: Vashe 5.8 (oz) 1 x Per Day/30 Days Discharge Instructions: Cleanse the wound with Vashe prior to applying Day clean dressing using gauze sponges, not tissue or cotton balls. Prim Dressing: Hydrofera Blue Ready Transfer Foam, 2.5x2.5 (in/in) (DME) (Generic) 1 x Per Day/30 Days ary Discharge Instructions: Apply directly to wound bed as directed Secondary Dressing: Bordered Gauze, 2x3.75 in (DME) (Generic) 1 x Per Day/30 Days Belinda, BADER Day ([id number]) [phone].pdf Page 4 of 9 Discharge Instructions: Apply over primary dressing as directed. Laboratory Bacteria identified in Tissue by Biopsy culture (MICRO) - Left Anterior lower leg- Sent to [facility] - (ICD10 S81.812A - Laceration without foreign body, left lower leg, initial encounter) LOINC Code: [phone] Convenience Name: Biopsy specimen culture Electronic Signature(s) Signed: 10/27/2022 5:35:47 PM By: Allen Derry PA-C Signed: 10/27/2022 5:54:54 PM By: Karie Schwalbe RN Entered By: Karie Schwalbe on 10/27/2022 12:47:37 -------------------------------------------------------------------------------- Problem List Details Patient Name: Date of Service: [name] 10/27/2022 12:30 PM Medical Record Number: [id number] Patient Account Number: [id number] Date of Birth/Sex: Treating RN: 07/28/1945 (77 y.o. F) Primary Care Provider: Tana Conch Other Clinician: Referring Provider: Treating Provider/Extender: Brunetta Jeans in Treatment: 0 Active Problems ICD-10 Encounter Code Description Active Date MDM Diagnosis S81.812A Laceration without foreign body, left lower leg, initial encounter 10/27/2022 No Yes L03.116 Cellulitis of left lower limb 10/27/2022 No Yes E11.622 Type 2 diabetes mellitus with other skin ulcer 10/27/2022 No Yes L97.822 Non-pressure chronic ulcer of other part of left lower leg with fat layer exposed10/02/2022 No Yes I10 Essential (primary)  hypertension 10/27/2022 No Yes J44.89 Other specified chronic obstructive pulmonary disease 10/27/2022 No Yes Inactive Problems Resolved Problems Electronic Signature(s) Signed: 10/27/2022 1:46:53 PM By: Allen Derry PA-C Entered By: Allen Derry on 10/27/2022 10:46:53 Belinda Day ([id number]) [id number] Page 5 of 9 -------------------------------------------------------------------------------- Progress Note Details Patient Name: Date of Service: Belinda, Day 10/27/2022 12:30 PM Medical Record Number: [id number] Patient Account Number: [id number] Date of Birth/Sex: Treating RN: 1945-07-09 (77 y.o. F) Primary Care Provider:  Tana Conch Other Clinician: Referring Provider: Treating Provider/Extender: Brunetta Jeans in Treatment: 0 Subjective Chief Complaint Information obtained from Patient Left LE Ulcer History of Present Illness (HPI) 10/27/2022 upon evaluation today patient presents for initial inspection here in the clinic regarding Day wound over the left anterior lower extremity. T back up Day o little bit and gives some history I am going to recount what the patient brought in written out on her sheet with her today which is an excellent history of the course of this wound. On August 3 the patient sustained an injury to the left leg due to Day fall off of Day ladder. This was essentially Day deep cut and continue to bleed for about 4 days. On August 7 she saw Dr. Lenn Cal at [facility] and was started on Keflex at that point. Subsequently on August 14 she went back to [facility] where she was placed on doxycycline 100 mg for 2 rounds. The next time that she was evaluated was actually about 3 weeks later when back at [facility] September 6 she was sent to the hospital due to what appeared to be worsening and started on vancomycin IV. September 7 she was actually discharged from the hospital with  linezolid 600 mg. On September 18 she saw Dr. Anselm Jungling who recommended the patient go to [facility] long the next morning. On September 19 she saw infectious disease and they ordered an MRI as well as putting the patient on Dalvance. This was by IV. The next appointment with infectious disease was September 26 at that point it was noted that the patient had Day number MRI and the results of that were reported out on 10-14-2022 which showed that she had soft tissue swelling of the distal lower leg with focal skin thickening and enhancement anteriorly consistent with cellulitis no abscess. There was also Day thin periosteal edema and enhancement along the anterior medial aspect of the distal tibia this is stated to be likely reactive no definitive osteomyelitis noted. Subsequently she states that she did follow-up requesting to go to the wound center for further evaluation and was referred to [place] today October 2 for evaluation in the meantime she has been using Silvadene cream. Her most recent hemoglobin A1c was 6.6 she does have diabetes but otherwise no major medical problems other than hypertension and COPD that would affect her ability to heal. Patient History Information obtained from Patient, Chart. Allergies codeine, Bactrim, Cipro, Levaquin Family History Cancer - Mother,Father, Diabetes - Mother, Heart Disease - Mother, Hypertension - Mother,Father, Stroke - Mother. Social History Former smoker - quit 1982, Marital Status - Single, Alcohol Use - Never, Drug Use - No History, Caffeine Use - Never. Medical History Respiratory Patient has history of Chronic Obstructive Pulmonary Disease (COPD) Cardiovascular Patient has history of Coronary Artery Disease - CABG, Hypertension Endocrine Patient has history of Type II Diabetes - HgbA1c 6.6 Denies history of Type I Diabetes Patient is treated with Oral Agents. Blood sugar is not tested. Hospitalization/Surgery History - CABG 01/2002. Medical Day Surgical  History Notes nd Constitutional Symptoms (General Health) MAI (mycobacterium avium-intracellulare CVA hypothyroidism Respiratory chronic obstructive bronchiectasis Endocrine Prediabetic Review of Systems (ROS) Eyes Complains or has symptoms of Glasses / Contacts. Ear/Nose/Mouth/Throat Denies complaints or symptoms of Chronic sinus problems or rhinitis. Gastrointestinal Denies complaints or symptoms of Frequent diarrhea, Nausea, Vomiting. Genitourinary Denies complaints or symptoms of Frequent urination. Integumentary (Skin) Complains or has symptoms of Wounds - lle. Musculoskeletal  Denies complaints or symptoms of Muscle Pain, Muscle Weakness. Belinda, HAYTER Day ([id number]) [id number] Page 6 of 9 Neurologic Denies complaints or symptoms of Numbness/parasthesias. Psychiatric Denies complaints or symptoms of Claustrophobia. Objective Constitutional sitting or standing blood pressure is within target range for patient.. pulse regular and within target range for patient.Marland Kitchen respirations regular, non-labored and within target range for patient.Marland Kitchen temperature within target range for patient.. Well-nourished and well-hydrated in no acute distress. Vitals Time Taken: 12:54 PM, Height: 61 in, Source: Stated, Weight: 113 lbs, Source: Stated, BMI: 21.3, Temperature: 98.7 F, Pulse: 61 bpm, Respiratory Rate: 18 breaths/min, Blood Pressure: 137/72 mmHg. Eyes conjunctiva clear no eyelid edema noted. pupils equal round and reactive to light and accommodation. Ears, Nose, Mouth, and Throat no gross abnormality of ear auricles or external auditory canals. normal hearing noted during conversation. mucus membranes moist. Respiratory normal breathing without difficulty. Cardiovascular 2+ dorsalis pedis/posterior tibialis pulses. trace pitting edema of the bilateral lower extremities. Musculoskeletal normal gait and posture. no significant deformity or arthritic changes, no  loss or range of motion, no clubbing. Psychiatric this patient is able to make decisions and demonstrates good insight into disease process. Alert and Oriented x 3. pleasant and cooperative. General Notes: Upon inspection patient's wound actually to be honest appears to be completely healed. This was somewhat of Day surprise to me I did not expect to find that on evaluation however there was Day bigger issue which did look to be apparent upon inspection and that that secondary to the trauma I feel like it may have activated Day skin cancer that was lying dormant in the area it appears on the medial aspect of the wound and this actually may be Day squamous or basal cell carcinoma. It is very characteristic of what I am seeing this type of thing happened before. She had maybe Day minimal amount of swelling but really nothing significant I do not believe this is venous in nature. Subsequently I discussed with the patient that I do feel like she probably needs to have Day biopsy of the area and she voiced understanding I actually did go ahead and after numbing her with 6 cc of lidocaine 1% with epinephrine performed Day 5 mm punch biopsy to send for evaluation and depending on the results of that we will make any additional recommendations for the patient as necessary. She voiced understanding and is in agreement with the plan. Integumentary (Hair, Skin) Wound #1 status is Open. Original cause of wound was Skin T ear/Laceration. The date acquired was: 08/28/2022. The wound is located on the Left,Anterior Lower Leg. The wound measures 1cm length x 1cm width x 0.1cm depth; 0.785cm^2 area and 0.079cm^3 volume. There is Fat Layer (Subcutaneous Tissue) exposed. There is no tunneling or undermining noted. There is Day small amount of serous drainage noted. There is no granulation within the wound bed. There is Day large (67- 100%) amount of necrotic tissue within the wound bed including Adherent Slough. The periwound skin  appearance exhibited: Scarring. Assessment Active Problems ICD-10 Laceration without foreign body, left lower leg, initial encounter Cellulitis of left lower limb Type 2 diabetes mellitus with other skin ulcer Non-pressure chronic ulcer of other part of left lower leg with fat layer exposed Essential (primary) hypertension Other specified chronic obstructive pulmonary disease Procedures Wound #1 Pre-procedure diagnosis of Wound #1 is Day Cellulitis located on the Left, Anterior Lower Leg . There was Day biopsy performed by Lenda Kelp, PA. There was Day biopsy performed on Wound Bed.  The skin was cleansed and prepped with anti-septic followed by pain control using Other. Utilizing Day 5 mm tissue punch, tissue was removed at its base with the following instrument(s): Forceps and Scissors and sent to pathology. Day time out was conducted at 13:55, prior to the start of the procedure. The procedure was tolerated well with Day pain level of 0 throughout and Day pain level of 0 following the procedure. Post procedure Diagnosis Wound #1: Same as Pre-Procedure Belinda, Day Day ([id number]) [phone].pdf Page 7 of 9 Plan Follow-up Appointments: Return Appointment in 1 week. Allen Derry PA Wednesday room 7 11/03/22 at 9:45am Anesthetic: (In clinic) Topical Lidocaine 5% applied to wound bed Bathing/ Shower/ Hygiene: May shower and wash wound with soap and water. - Please protect After shower/bath please redress wound. Edema Control - Lymphedema / SCD / Other: Elevate legs to the level of the heart or above for 30 minutes daily and/or when sitting for 3-4 times Day day throughout the day. - When sitting , elevate legs. Avoid standing for long periods of time. Exercise regularly - As tolerated Additional Orders / Instructions: Other: - Punch Biopsy sent to [facility] labs. (10/27/22) Laboratory ordered were: Biopsy specimen culture - Left Anterior lower leg- Sent to [facility] WOUND #1: - Lower  Leg Wound Laterality: Left, Anterior Cleanser: Soap and Water 1 x Per Day/30 Days Discharge Instructions: May shower and wash wound with dial antibacterial soap and water prior to dressing change. Cleanser: Vashe 5.8 (oz) 1 x Per Day/30 Days Discharge Instructions: Cleanse the wound with Vashe prior to applying Day clean dressing using gauze sponges, not tissue or cotton balls. Prim Dressing: Hydrofera Blue Ready Transfer Foam, 2.5x2.5 (in/in) (DME) (Generic) 1 x Per Day/30 Days ary Discharge Instructions: Apply directly to wound bed as directed Secondary Dressing: Bordered Gauze, 2x3.75 in (DME) (Generic) 1 x Per Day/30 Days Discharge Instructions: Apply over primary dressing as directed. 1. At this point I did going to send Day biopsy for the patient as Day 5 mm punch of the affected area which I think is likely to be Day basal or squamous cell carcinoma. 2. I would recommend as well that in the meantime we going use Hydrofera Blue followed by border gauze dressing to cover. 3 I am also can recommend that we have the patient clean this with Vashe 1 time per day. 4. Depend on the results of the biopsy we will likely make an referral to the the skin surgery center here in [place]. She does have Day dermatologist we could always refer her there but I am not sure they perform Mohs surgery or not we can always check with them and see in the event that becomes necessary. We will see patient back for reevaluation in 1 week here in the clinic. If anything worsens or changes patient will contact our office for additional recommendations. Electronic Signature(s) Signed: 10/27/2022 5:34:56 PM By: Allen Derry PA-C Entered By: Allen Derry on 10/27/2022 14:34:56 -------------------------------------------------------------------------------- HxROS Details Patient Name: Date of Service: [name] 10/27/2022 12:30 PM Medical Record Number: [id number] Patient Account Number: [id number] Date of Birth/Sex:  Treating RN: [birth year] (77 y.o. Arta Silence Primary Care Provider: Tana Conch Other Clinician: Referring Provider: Treating Provider/Extender: Brunetta Jeans in Treatment: 0 Information Obtained From Patient Chart Eyes Complaints and Symptoms: Positive for: Glasses / Contacts Ear/Nose/Mouth/Throat Complaints and Symptoms: Negative for: Chronic sinus problems or rhinitis Gastrointestinal Complaints and Symptoms: Negative for: Frequent diarrhea; Nausea; Vomiting Belinda,  Caris Day ([id number]) [id number] Page 8 of 9 Genitourinary Complaints and Symptoms: Negative for: Frequent urination Integumentary (Skin) Complaints and Symptoms: Positive for: Wounds - lle Musculoskeletal Complaints and Symptoms: Negative for: Muscle Pain; Muscle Weakness Neurologic Complaints and Symptoms: Negative for: Numbness/parasthesias Psychiatric Complaints and Symptoms: Negative for: Claustrophobia Constitutional Symptoms (General Health) Medical History: Past Medical History Notes: MAI (mycobacterium avium-intracellulare CVA hypothyroidism Hematologic/Lymphatic Respiratory Medical History: Positive for: Chronic Obstructive Pulmonary Disease (COPD) Past Medical History Notes: chronic obstructive bronchiectasis Cardiovascular Medical History: Positive for: Coronary Artery Disease - CABG; Hypertension Endocrine Medical History: Positive for: Type II Diabetes - HgbA1c 6.6 Negative for: Type I Diabetes Past Medical History Notes: Prediabetic Treated with: Oral agents Blood sugar tested every day: No Immunological Oncologic Immunizations Pneumococcal Vaccine: Received Pneumococcal Vaccination: Yes Received Pneumococcal Vaccination On or After 60th Birthday: Yes Implantable Devices None Hospitalization / Surgery History Type of Hospitalization/Surgery CABG 01/2002 Family and Social History Belinda, Day ([id number])  [id number] Page 9 of 9 Cancer: Yes - Mother,Father; Diabetes: Yes - Mother; Heart Disease: Yes - Mother; Hypertension: Yes - Mother,Father; Stroke: Yes - Mother; Former smoker - quit 1982; Marital Status - Single; Alcohol Use: Never; Drug Use: No History; Caffeine Use: Never; Financial Concerns: No; Food, Clothing or Shelter Needs: No; Support System Lacking: No; Transportation Concerns: No Electronic Signature(s) Signed: 10/27/2022 5:35:47 PM By: Allen Derry PA-C Signed: 10/27/2022 5:53:00 PM By: Shawn Stall RN, BSN Signed: 10/28/2022 4:51:22 PM By: Redmond Pulling RN, BSN Entered By: Redmond Pulling on 10/27/2022 10:01:34 -------------------------------------------------------------------------------- SuperBill Details Patient Name: Date of Service: [name] 10/27/2022 Medical Record Number: [id number] Patient Account Number: [id number] Date of Birth/Sex: Treating RN: 03-Jul-1945 (77 y.o. Katrinka Blazing Primary Care Provider: Tana Conch Other Clinician: Referring Provider: Treating Provider/Extender: Brunetta Jeans in Treatment: 0 Diagnosis Coding ICD-10 Codes Code Description [phone] Laceration without foreign body, left lower leg, initial encounter L03.116 Cellulitis of left lower limb E11.622 Type 2 diabetes mellitus with other skin ulcer L97.822 Non-pressure chronic ulcer of other part of left lower leg with fat layer exposed I10 Essential (primary) hypertension J44.89 Other specified chronic obstructive pulmonary disease Facility Procedures : CPT4 Code: 03500938 Description: 99213 - WOUND CARE VISIT-LEV 3 EST PT Modifier: Quantity: 1 : CPT4 Code: 18299371 Description: 11104-Punch biopsy of skin (including simple closure, when performed) single lesion ICD-10 Diagnosis Description L97.822 Non-pressure chronic ulcer of other part of left lower leg with fat layer exposed Modifier: Quantity: 1 Physician  Procedures : CPT4 Code Description Modifier [id number] - WC PHYS LEVEL 4 - NEW PT 25 ICD-10 Diagnosis Description S81.812A Laceration without foreign body, left lower leg, initial encounter L03.116 Cellulitis of left lower limb E11.622 Type 2 diabetes mellitus  with other skin ulcer L97.822 Non-pressure chronic ulcer of other part of left lower leg with fat layer exposed Quantity: 1 : 11104 Punch biopsy of skin (including simple closure, when performed) single lesion ICD-10 Diagnosis Description L97.822 Non-pressure chronic ulcer of other part of left lower leg with fat layer exposed Quantity: 1 Electronic Signature(s) Signed: 10/27/2022 5:35:35 PM By: Allen Derry PA-C Entered By: Allen Derry on 10/27/2022 14:35:35

## 2022-10-28 NOTE — Progress Notes (Deleted)
Subjective:    Patient ID: Belinda Day, female   DOB: 06-01-1945    MRN: [id number]   Brief patient profile:  77 yowf MM/ quit smoking 1972 with documented right middle lobe syndrome and evidence of bronchiectasis by CT scan in March 2002  And GOLD II criteria for copd 09/2010     History of Present Illness  08/08/07 FOB with classic cobblestoning and MAI on culture.   08/15/07 given Levaquin x 10 days with resolution bloody mucus, but "felt she had flu the whole time" with aches, feverish   August 29, 2007 ov: first post bronch co still coughing up mucus clear and initiate rx with symbicort/ [facility]   October 19, 2007 ov no cough , sob, feeling great but no improvement on cxr   December 07, 2007 ov feeling great, minimal am cough not productive. No sob.   Opth eval, labs ok 10/2007   September 06, 2008 ov overall better over the last year, less tendency to exac on zmax and ethambutol. rec complete another year > satisfied improved 90% and stopped zmax and eth 08/2009     05/16/17 rec zmax daily but did not do  11/14/2017  f/u ov/Laymon Stockert re: obst bronchiectasis/ confused with details of care / taking zpak prn but also has omnicef and not sure what to do with it  Chief Complaint  Patient presents with   Follow-up    no current problems   Dyspnea:  yardwork ok / MMRC1 = can walk nl pace, flat grade, can't hurry or go uphills or steps s sob   Cough: variable esp in am / using flutter and vest prn / mucus beige and about a tbsp in am s heme Sleeping:  Bed flat and one big pilow  SABA use: rarely needed  rec Plan A = Automatic = symbicort 160  And take zmax daily  Plan B = Backup for  breathing Only use your albuterol as a rescue medication Plan B = for mucus if Nastier than nl especially assoc with any fever >  omnicef 300 mg twice daily x 7days Plan C = Call me if needed and A and B aren't working well   cxr on return     08/31/2021  f/u ov/Kyah Buesing re:  obstructive bronchiectasis  maint on dulera  200/ zmax daily  Chief Complaint  Patient presents with   Follow-up    Breathing is unchanged and no new co's. She is using her albuterol inhaler 2-3 x per wk. She has noticed some difficulty swallowing recently.   Dyspnea:  yardwork when cool Cough: mainly in am  Sleeping: flat bed / one pillow SABA use: rare 02: none Covid status:   up to date Rec You will need covid update, RSV and flu shots this fall   Try dulera 100 Take 2 puffs first thing in am and then another 2 puffs about 12 hours later.        09/08/2022  yearly f/u ov/Romanita Fager re: bronchiectasis  maint on dulera 200 2bid  / daily zmax Chief Complaint  Patient presents with   Follow-up  Dyspnea:  no change / limited by L ankle pain p fell 3 weeks with laceration / just finished keflex 09/07/22 no better pain on wt bearing  or redness around wound but no fever /chills  Cough: p meals occ minimal pale green no change on keflex  Sleeping: level bed/ 2 pillows  SABA use: rarely hfa  02:  none  Rec Try prilosec otc 20mg   Take 30-60 min before first meal of the day and Pepcid ac (famotidine) 20 mg one @  bedtime until cough is completely gone for at least a week  Please schedule a follow up office visit in 4 weeks, sooner if needed - bring inhaler     10/28/2022  f/u ov/Jarick Harkins re: ***   maint on ***  No chief complaint on file.   Dyspnea:  *** Cough: *** Sleeping: *** resp cc  SABA use: *** 02: ***  Lung cancer screening :  ***    No obvious day to day or daytime variability or assoc excess/ purulent sputum or mucus plugs or hemoptysis or cp or chest tightness, subjective wheeze or overt sinus or hb symptoms.    Also denies any obvious fluctuation of symptoms with weather or environmental changes or other aggravating or alleviating factors except as outlined above   No unusual exposure hx or h/o childhood pna/ asthma or knowledge of premature birth.  Current Allergies,  Complete Past Medical History, Past Surgical History, Family History, and Social History were reviewed in [employer] record.  ROS  The following are not active complaints unless bolded Hoarseness, sore throat, dysphagia, dental problems, itching, sneezing,  nasal congestion or discharge of excess mucus or purulent secretions, ear ache,   fever, chills, sweats, unintended wt loss or wt gain, classically pleuritic or exertional cp,  orthopnea pnd or arm/hand swelling  or leg swelling, presyncope, palpitations, abdominal pain, anorexia, nausea, vomiting, diarrhea  or change in bowel habits or change in bladder habits, change in stools or change in urine, dysuria, hematuria,  rash, arthralgias, visual complaints, headache, numbness, weakness or ataxia or problems with walking or coordination,  change in mood or  memory.        No outpatient medications have been marked as taking for the 10/28/22 encounter (Appointment) with Nyoka Cowden, MD.                        Past Medical History:  Bronchiectasis see CT SE 04/13/00  - HFA 75% November 19, 2009  - alpha one screen 01/14/2015 >  MM  - IgE 01/14/2015 = 11  MAI  - Rx Zmax and ETH 08/29/07 > 08/2009 restarted empirically 05/31/14 > 09/03/14 (no change in cough so just use zpak for flares)  - Rx zmax maint  10/16/2015 >>> d/c 08/03/2016 > restarted 05/16/17 and clinically improved 08/02/2017 using prn omnicef to supplment  - Eye eval   10/09.......................[name]  - Intol of levaquin so try cycles of cipro April 02, 2010  HEALTH MAINTENANCE...........................[name] - Td 10/2007  - Pneumovax 2005   and 10/29/2011 age 67, prevnar 08/16/2013  CAD  Hyperlipidemia  Hypertension  History of cough with ACE inhibition.  Gastroesophageal reflux disease         Objective:   Physical Exam  Wts  10/28/2022   ***  09/08/2022  112  08/31/2021    114 08/25/2020    110    02/26/2020   116 08/27/2019    119 02/16/2019   124 08/15/2018  123  Wt 130 October 19, 2007>140 March 31, 2010 > 127 07/16/2010 > 10/14/2010  120 > 05/04/2011  117 > 07/30/2011  120 > 10/29/2011 118 > 130  05/01/2012 > 12/12/2012 132 >  08/14/13 137 >    02/20/2014  137 >  05/31/2014 134 > 09/03/2014    140 > 10/15/2014  137 > 01/14/2015 137 >  05/15/2015 123 >08/14/2015  124 >  10/16/2015 126 > 03/02/2016   124  > 05/04/2016  126 > 08/03/2016   130 > 02/14/2017  126 > 05/16/2017 128 > 08/02/2017  119 > 11/14/2017  116     Vital signs reviewed  10/28/2022  - Note at rest 02 sats  ***% on ***   General appearance:    ***   Mod barr***                Assessment:

## 2022-11-03 ENCOUNTER — Encounter (HOSPITAL_BASED_OUTPATIENT_CLINIC_OR_DEPARTMENT_OTHER): Payer: Medicare Other | Admitting: Physician Assistant

## 2022-11-03 DIAGNOSIS — J449 Chronic obstructive pulmonary disease, unspecified: Secondary | ICD-10-CM | POA: Diagnosis not present

## 2022-11-03 DIAGNOSIS — L03116 Cellulitis of left lower limb: Secondary | ICD-10-CM | POA: Diagnosis not present

## 2022-11-03 DIAGNOSIS — I10 Essential (primary) hypertension: Secondary | ICD-10-CM | POA: Diagnosis not present

## 2022-11-03 DIAGNOSIS — L97822 Non-pressure chronic ulcer of other part of left lower leg with fat layer exposed: Secondary | ICD-10-CM | POA: Diagnosis not present

## 2022-11-03 DIAGNOSIS — E11622 Type 2 diabetes mellitus with other skin ulcer: Secondary | ICD-10-CM | POA: Diagnosis not present

## 2022-11-03 DIAGNOSIS — S81812A Laceration without foreign body, left lower leg, initial encounter: Secondary | ICD-10-CM | POA: Diagnosis not present

## 2022-11-03 DIAGNOSIS — C44729 Squamous cell carcinoma of skin of left lower limb, including hip: Secondary | ICD-10-CM | POA: Diagnosis not present

## 2022-11-03 NOTE — Progress Notes (Addendum)
WYATT, LEEMING Day ([id number]) [id number] Page 1 of 7 Visit Report for 11/03/2022 Chief Complaint Document Details Patient Name: Date of Service: Belinda Day, Belinda Day. 11/03/2022 9:45 [name] Medical Record Number: [id number] Patient Account Number: [id number] Date of Birth/Sex: Treating RN: January 30, 1945 (77 y.o. F) Primary Care Provider: Tana Conch Other Clinician: Referring Provider: Treating Provider/Extender: Brunetta Jeans in Treatment: 1 Information Obtained from: Patient Chief Complaint Left LE Ulcer Electronic Signature(s) Signed: 11/03/2022 9:48:16 AM By: Allen Derry PA-C Entered By: Allen Derry on 11/03/2022 06:48:16 -------------------------------------------------------------------------------- HPI Details Patient Name: Date of Service: Belinda Pluck RO N Day. 11/03/2022 9:45 [name] Medical Record Number: [id number] Patient Account Number: [id number] Date of Birth/Sex: Treating RN: [birth year] (77 y.o. F) Primary Care Provider: Tana Conch Other Clinician: Referring Provider: Treating Provider/Extender: Brunetta Jeans in Treatment: 1 History of Present Illness HPI Description: 10/27/2022 upon evaluation today patient presents for initial inspection here in the clinic regarding Day wound over the left anterior lower extremity. T back up Day little bit and gives some history I am going to recount what the patient brought in written out on her sheet with her today which is an o excellent history of the course of this wound. On August 3 the patient sustained an injury to the left leg due to Day fall off of Day ladder. This was essentially Day deep cut and continue to bleed for about 4 days. On August 7 she saw Dr. Lenn Cal at [facility] and was started on Keflex at that point. Subsequently on August 14 she went back to [facility] where she was placed on doxycycline 100 mg for 2 rounds. The next time  that she was evaluated was actually about 3 weeks later when back at [facility] September 6 she was sent to the hospital due to what appeared to be worsening and started on vancomycin IV. September 7 she was actually discharged from the hospital with linezolid 600 mg. On September 18 she saw Dr. Anselm Jungling who recommended the patient go to [facility] long the next morning. On September 19 she saw infectious disease and they ordered an MRI as well as putting the patient on Dalvance. This was by IV. The next appointment with infectious disease was September 26 at that point it was noted that the patient had Day number MRI and the results of that were reported out on 10-14-2022 which showed that she had soft tissue swelling of the distal lower leg with focal skin thickening and enhancement anteriorly consistent with cellulitis no abscess. There was also Day thin periosteal edema and enhancement along the anterior medial aspect of the distal tibia this is stated to be likely reactive no definitive osteomyelitis noted. Subsequently she states that she did follow-up requesting to go to the wound center for further evaluation and was referred to [place] today October 2 for evaluation in the meantime she has been using Silvadene cream. Her most recent hemoglobin A1c was 6.6 she does have diabetes but otherwise no major medical problems other than hypertension and COPD that would affect her ability to heal. 11-03-2022 upon evaluation today patient appears to be doing okay currently in regard to her wound. I did actually perform Day biopsy last week and came back positive for squamous cell carcinoma. With that being said I discussed with the patient that she is likely can require Day full excision of this area and I recommended that she may need to go to  the skin surgery center unless we get her in with her dermatologist sooner. I am not sure if they have Day Mohs surgeon at [facility] dermatology or not. With that being  said we will get Day see about get in touch with them and finding out which I think will definitely be of benefit if so because it could be something when she gets in much faster. Electronic Signature(s) Signed: 11/03/2022 10:51:39 AM By: Allen Derry PA-C Entered By: Allen Derry on 11/03/2022 07:51:39 Brayton Layman Day ([id number]) [id number] Page 2 of 7 -------------------------------------------------------------------------------- Physical Exam Details Patient Name: Date of Service: Belinda Day, Belinda Day. 11/03/2022 9:45 [name] Medical Record Number: [id number] Patient Account Number: [id number] Date of Birth/Sex: Treating RN: January 03, 1946 (77 y.o. F) Primary Care Provider: Tana Conch Other Clinician: Referring Provider: Treating Provider/Extender: Brunetta Jeans in Treatment: 1 Constitutional Well-nourished and well-hydrated in no acute distress. Respiratory normal breathing without difficulty. Psychiatric this patient is able to make decisions and demonstrates good insight into disease process. Alert and Oriented x 3. pleasant and cooperative. Notes Upon inspection patient's wound bed actually showed signs of having some necrotic tissue noted secondary to the chemical cauterization this is completely to be expected with how we had to stop the bleeding after last weeks of biopsy. Nonetheless I do believe that full excision of this area does give her the chance to actually see this completely resolve. Nonetheless I think this needs to be done as soon as possible. This has been causing her discomfort and the sooner we get this cleared away the faster we can get her healed Electronic Signature(s) Signed: 11/03/2022 10:52:01 AM By: Allen Derry PA-C Entered By: Allen Derry on 11/03/2022 07:52:00 -------------------------------------------------------------------------------- Physician Orders Details Patient Name: Date of Service: Belinda Pluck  RO N Day. 11/03/2022 9:45 [name] Medical Record Number: [id number] Patient Account Number: [id number] Date of Birth/Sex: Treating RN: 1945-07-29 (77 y.o. Belinda Day, Millard.Loa Primary Care Provider: Tana Conch Other Clinician: Referring Provider: Treating Provider/Extender: Brunetta Jeans in Treatment: 1 The following information was scribed by: Shawn Stall The information was scribed for: Lenda Kelp Verbal / Phone Orders: No Diagnosis Coding ICD-10 Coding Code Description [phone] Laceration without foreign body, left lower leg, initial encounter L03.116 Cellulitis of left lower limb E11.622 Type 2 diabetes mellitus with other skin ulcer L97.822 Non-pressure chronic ulcer of other part of left lower leg with fat layer exposed I10 Essential (primary) hypertension J44.89 Other specified chronic obstructive pulmonary disease Follow-up Appointments ppointment in 2 weeks. Allen Derry PA Wednesday room 7 11/17/2022 1115 Return Day ****Call wound center and cancel appointment if you get in with skin surgery center***** Other: - Referral to [facility]- call them if you have not heard from them with an appointment by Friday. address: [address] Phone # [phone] Will send you primary care provider Day note requesting you Day pain medication for pain to left leg. Belinda Day, Belinda Day ([id number]) [id number] Page 3 of 7 Will call [facility] Dermatology office and let them know you have been diagnosed with skin Ca and see if they can perform Moh's surgery and if not will send you to Skin Surgery center. Purchase over the counter AandD ointment apply over the area before hydrofera blue. Anesthetic (In clinic) Topical Lidocaine 5% applied to wound bed Bathing/ Shower/ Hygiene May shower and wash wound with soap and water. - Please protect After shower/bath please redress wound. Edema Control -  Lymphedema / SCD /  Other Left Lower Extremity Elevate legs to the level of the heart or above for 30 minutes daily and/or when sitting for 3-4 times Day day throughout the day. - When sitting , elevate legs. Avoid standing for long periods of time. Exercise regularly - As tolerated Additional Orders / Instructions Other: - Punch Biopsy sent to [facility] labs. (10/27/22) Wound Treatment Wound #1 - Lower Leg Wound Laterality: Left, Anterior Cleanser: Soap and Water 1 x Per Day/30 Days Discharge Instructions: May shower and wash wound with dial antibacterial soap and water prior to dressing change. Cleanser: Vashe 5.8 (oz) 1 x Per Day/30 Days Discharge Instructions: Cleanse the wound with Vashe prior to applying Day clean dressing using gauze sponges, not tissue or cotton balls. Topical: AandD ointment 1 x Per Day/30 Days Discharge Instructions: apply directly to wound bed and around the wound. Prim Dressing: Hydrofera Blue Ready Transfer Foam, 2.5x2.5 (in/in) (Generic) 1 x Per Day/30 Days ary Discharge Instructions: Apply directly to wound bed as directed Secondary Dressing: Bordered Gauze, 2x3.75 in (Generic) 1 x Per Day/30 Days Discharge Instructions: Apply over primary dressing as directed. Consults Dermatology - Referral to [facility] for possible Moh's Surgery for positive skin biopsy noting squamous cell carcinoma to left anterior leg area. Electronic Signature(s) Signed: 11/04/2022 4:19:45 PM By: Allen Derry PA-C Previous Signature: 11/03/2022 4:43:13 PM Version By: Allen Derry PA-C Previous Signature: 11/03/2022 5:49:45 PM Version By: Shawn Stall RN, BSN Entered By: Allen Derry on 11/04/2022 13:19:45 Prescription 11/03/2022 -------------------------------------------------------------------------------- Sheral Flow PA Patient Name: Provider: [birth year] [id number] Date of Birth: NPI#Sherral Hammers Sex: DEA #: [phone] Phone #: License  #: UPN: Patient Address: [address] Eligha Bridegroom [facility] [place], [place] [address] [address] [place], [place] [address] [phone] Allergies codeine; Bactrim; Cipro; Levaquin Popelka, Quinlan Day ([id number]) [id number] Page 4 of 7 Provider's Orders Dermatology - Referral to [facility] for possible Moh's Surgery for positive skin biopsy noting squamous cell carcinoma to left anterior leg area. Hand Signature: Date(s): Electronic Signature(s) Signed: 11/24/2022 4:33:53 PM By: Allen Derry PA-C Previous Signature: 11/03/2022 4:43:13 PM Version By: Allen Derry PA-C Previous Signature: 11/03/2022 5:49:45 PM Version By: Shawn Stall RN, BSN Entered By: Allen Derry on 11/04/2022 13:19:45 -------------------------------------------------------------------------------- Problem List Details Patient Name: Date of Service: Belinda Pluck RO N Day. 11/03/2022 9:45 [name] Medical Record Number: [id number] Patient Account Number: [id number] Date of Birth/Sex: Treating RN: [birth year] (77 y.o. F) Primary Care Provider: Tana Conch Other Clinician: Referring Provider: Treating Provider/Extender: Brunetta Jeans in Treatment: 1 Active Problems ICD-10 Encounter Code Description Active Date MDM Diagnosis S81.812A Laceration without foreign body, left lower leg, initial encounter 10/27/2022 No Yes L03.116 Cellulitis of left lower limb 10/27/2022 No Yes E11.622 Type 2 diabetes mellitus with other skin ulcer 10/27/2022 No Yes L97.822 Non-pressure chronic ulcer of other part of left lower leg with fat layer exposed10/02/2022 No Yes I10 Essential (primary) hypertension 10/27/2022 No Yes J44.89 Other specified chronic obstructive pulmonary disease 10/27/2022 No Yes Inactive Problems Resolved Problems Electronic Signature(s) Signed: 11/03/2022 9:48:07 AM By: Allen Derry PA-C Entered By: Allen Derry on 11/03/2022 06:48:07 Brayton Layman Day ([id number]) [id number] Page 5 of 7 -------------------------------------------------------------------------------- Progress Note Details Patient Name: Date of Service: Belinda Day, Belinda Day. 11/03/2022 9:45 [name] Medical Record Number: [id number] Patient Account Number: [id number] Date of Birth/Sex: Treating RN: [phone]  y.o. F) Primary Care Provider: Tana Conch Other Clinician: Referring Provider: Treating Provider/Extender: Brunetta Jeans in Treatment: 1 Subjective Chief Complaint Information obtained from Patient Left LE Ulcer History of Present Illness (HPI) 10/27/2022 upon evaluation today patient presents for initial inspection here in the clinic regarding Day wound over the left anterior lower extremity. T back up Day o little bit and gives some history I am going to recount what the patient brought in written out on her sheet with her today which is an excellent history of the course of this wound. On August 3 the patient sustained an injury to the left leg due to Day fall off of Day ladder. This was essentially Day deep cut and continue to bleed for about 4 days. On August 7 she saw Dr. Lenn Cal at [facility] and was started on Keflex at that point. Subsequently on August 14 she went back to [facility] where she was placed on doxycycline 100 mg for 2 rounds. The next time that she was evaluated was actually about 3 weeks later when back at [facility] September 6 she was sent to the hospital due to what appeared to be worsening and started on vancomycin IV. September 7 she was actually discharged from the hospital with linezolid 600 mg. On September 18 she saw Dr. Anselm Jungling who recommended the patient go to [facility] long the next morning. On September 19 she saw infectious disease and they ordered an MRI as well as putting the patient on Dalvance. This was by  IV. The next appointment with infectious disease was September 26 at that point it was noted that the patient had Day number MRI and the results of that were reported out on 10-14-2022 which showed that she had soft tissue swelling of the distal lower leg with focal skin thickening and enhancement anteriorly consistent with cellulitis no abscess. There was also Day thin periosteal edema and enhancement along the anterior medial aspect of the distal tibia this is stated to be likely reactive no definitive osteomyelitis noted. Subsequently she states that she did follow-up requesting to go to the wound center for further evaluation and was referred to [place] today October 2 for evaluation in the meantime she has been using Silvadene cream. Her most recent hemoglobin A1c was 6.6 she does have diabetes but otherwise no major medical problems other than hypertension and COPD that would affect her ability to heal. 11-03-2022 upon evaluation today patient appears to be doing okay currently in regard to her wound. I did actually perform Day biopsy last week and came back positive for squamous cell carcinoma. With that being said I discussed with the patient that she is likely can require Day full excision of this area and I recommended that she may need to go to the skin surgery center unless we get her in with her dermatologist sooner. I am not sure if they have Day Mohs surgeon at [facility] dermatology or not. With that being said we will get Day see about get in touch with them and finding out which I think will definitely be of benefit if so because it could be something when she gets in much faster. Objective Constitutional Well-nourished and well-hydrated in no acute distress. Vitals Time Taken: 9:51 AM, Height: 61 in, Weight: 113 lbs, BMI: 21.3, Temperature: 97.8 F, Pulse: 78 bpm, Respiratory Rate: 18 breaths/min, Blood Pressure: 138/66 mmHg. Respiratory normal breathing without difficulty. Psychiatric this  patient is able to make decisions  and demonstrates good insight into disease process. Alert and Oriented x 3. pleasant and cooperative. General Notes: Upon inspection patient's wound bed actually showed signs of having some necrotic tissue noted secondary to the chemical cauterization this is completely to be expected with how we had to stop the bleeding after last weeks of biopsy. Nonetheless I do believe that full excision of this area does give her the chance to actually see this completely resolve. Nonetheless I think this needs to be done as soon as possible. This has been causing her discomfort and the sooner we get this cleared away the faster we can get her healed Integumentary (Hair, Skin) Wound #1 status is Open. Original cause of wound was Skin T ear/Laceration. The date acquired was: 08/28/2022. The wound has been in treatment 1 weeks. The wound is located on the Left,Anterior Lower Leg. The wound measures 0.3cm length x 0.3cm width x 0.1cm depth; 0.071cm^2 area and 0.007cm^3 volume. There is Fat Layer (Subcutaneous Tissue) exposed. There is no tunneling or undermining noted. There is Day small amount of serous drainage noted. There is small (1-33%) granulation within the wound bed. There is Day large (67-100%) amount of necrotic tissue within the wound bed including Adherent Slough. The periwound skin appearance exhibited: Scarring. Assessment Belinda Day, Belinda Day ([id number]) [id number] Page 6 of 7 Active Problems ICD-10 Laceration without foreign body, left lower leg, initial encounter Cellulitis of left lower limb Type 2 diabetes mellitus with other skin ulcer Non-pressure chronic ulcer of other part of left lower leg with fat layer exposed Essential (primary) hypertension Other specified chronic obstructive pulmonary disease Plan Follow-up Appointments: Return Appointment in 2 weeks. Allen Derry PA Wednesday room 7 11/17/2022 1115 ****Call wound center and  cancel appointment if you get in with skin surgery center***** Other: - Referral to [facility]- call them if you have not heard from them with an appointment by Friday. address: [address] Phone # [phone] Will send you primary care provider Day note requesting you Day pain medication for pain to left leg. Will call [facility] Dermatology office and let them know you have been diagnosed with skin Ca and see if they can perform Moh's surgery and if not will send you to Skin Surgery center. Purchase over the counter AandD ointment apply over the area before hydrofera blue. Anesthetic: (In clinic) Topical Lidocaine 5% applied to wound bed Bathing/ Shower/ Hygiene: May shower and wash wound with soap and water. - Please protect After shower/bath please redress wound. Edema Control - Lymphedema / SCD / Other: Elevate legs to the level of the heart or above for 30 minutes daily and/or when sitting for 3-4 times Day day throughout the day. - When sitting , elevate legs. Avoid standing for long periods of time. Exercise regularly - As tolerated Additional Orders / Instructions: Other: - Punch Biopsy sent to [facility] labs. (10/27/22) Consults ordered were: Dermatology - Referral to [facility] for possible Moh's Surgery for positive skin biopsy noting squamous cell carcinoma to left anterior leg area. WOUND #1: - Lower Leg Wound Laterality: Left, Anterior Cleanser: Soap and Water 1 x Per Day/30 Days Discharge Instructions: May shower and wash wound with dial antibacterial soap and water prior to dressing change. Cleanser: Vashe 5.8 (oz) 1 x Per Day/30 Days Discharge Instructions: Cleanse the wound with Vashe prior to applying Day clean dressing using gauze sponges, not tissue or cotton balls. Topical: AandD ointment 1 x Per Day/30 Days Discharge  Instructions: apply directly to wound bed and around the wound. Prim Dressing: Hydrofera Blue Ready  Transfer Foam, 2.5x2.5 (in/in) (Generic) 1 x Per Day/30 Days ary Discharge Instructions: Apply directly to wound bed as directed Secondary Dressing: Bordered Gauze, 2x3.75 in (Generic) 1 x Per Day/30 Days Discharge Instructions: Apply over primary dressing as directed. 1. I would recommend currently that we go ahead and make an referral either to the skin surgery center or [facility] dermatology who is her general dermatologist. 2. I am going to recommend as well that the patient should continue to monitor for any signs of infection or worsening. If anything changes she will let me know otherwise I will make an appointment for 2 weeks just to make sure everything is fine if she gets in with dermatology prior to doing that we will cancel that appointment. I just want make sure that she has not lost to follow-up. We will see patient back for reevaluation in 1 week here in the clinic. If anything worsens or changes patient will contact our office for additional recommendations. Electronic Signature(s) Signed: 11/04/2022 4:19:55 PM By: Allen Derry PA-C Previous Signature: 11/03/2022 10:52:35 AM Version By: Allen Derry PA-C Entered By: Allen Derry on 11/04/2022 13:19:54 -------------------------------------------------------------------------------- SuperBill Details Patient Name: Date of Service: Belinda Pluck RO N Day. 11/03/2022 Medical Record Number: [id number] Patient Account Number: [id number] Date of Birth/Sex: Treating RN: 1945/12/02 (77 y.o. Belinda Day Primary Care Provider: Tana Conch Other Clinician: Referring Provider: Treating Provider/Extender: Brunetta Jeans in Treatment: 1 Belinda Day, Belinda Day ([id number]) [id number] Page 7 of 7 Diagnosis Coding ICD-10 Codes Code Description [phone] Laceration without foreign body, left lower leg, initial encounter L03.116 Cellulitis of left lower limb E11.622 Type 2 diabetes mellitus  with other skin ulcer L97.822 Non-pressure chronic ulcer of other part of left lower leg with fat layer exposed I10 Essential (primary) hypertension J44.89 Other specified chronic obstructive pulmonary disease Facility Procedures : CPT4 Code: [id number] Description: 99213 - WOUND CARE VISIT-LEV 3 EST PT Modifier: Quantity: 1 Physician Procedures : CPT4 Code Description Modifier [id number] - WC PHYS LEVEL 3 - EST PT ICD-10 Diagnosis Description S81.812A Laceration without foreign body, left lower leg, initial encounter L03.116 Cellulitis of left lower limb E11.622 Type 2 diabetes mellitus with  other skin ulcer L97.822 Non-pressure chronic ulcer of other part of left lower leg with fat layer exposed Quantity: 1 Electronic Signature(s) Signed: 11/03/2022 10:52:48 AM By: Allen Derry PA-C Entered By: Allen Derry on 11/03/2022 07:52:48

## 2022-11-04 NOTE — Progress Notes (Signed)
Belinda Day ([id number]) [id number] Page 1 of 7 Visit Report for 11/03/2022 Arrival Information Details Patient Name: Date of Service: Belinda Day, Belinda Day 11/03/2022 9:45 [name] Medical Record Number: [id number] Patient Account Number: [id number] Date of Birth/Sex: Treating RN: [birth year] (77 y.o. F) Primary Care Esther Broyles: Tana Conch Other Clinician: Referring Justine Dines: Treating Jasenia Weilbacher/Extender: Brunetta Jeans in Treatment: 1 Visit Information History Since Last Visit Added or deleted any medications: No Patient Arrived: Ambulatory Any new allergies or adverse reactions: No Arrival Time: 09:37 Had Day fall or experienced change in No Accompanied By: friend activities of daily living that may affect Transfer Assistance: None risk of falls: Patient Identification Verified: Yes Signs or symptoms of abuse/neglect since last visito No Secondary Verification Process Completed: Yes Hospitalized since last visit: No Patient Has Alerts: Yes Implantable device outside of the clinic excluding No Patient Alerts: Patient on Blood Thinner cellular tissue based products placed in the center aspirin 81mg  since last visit: No ABI- Very painful. Has Dressing in Place as Prescribed: Yes Pain Present Now: Yes Electronic Signature(s) Signed: 11/03/2022 5:41:53 PM By: Thayer Dallas Entered By: Thayer Dallas on 11/03/2022 06:51:38 -------------------------------------------------------------------------------- Clinic Level of Care Assessment Details Patient Name: Date of Service: Belinda Day, Belinda Day. 11/03/2022 9:45 [name] Medical Record Number: [id number] Patient Account Number: [id number] Date of Birth/Sex: Treating RN: January 01, 1946 (77 y.o. Belinda Day Primary Care Sora Olivo: Tana Conch Other Clinician: Referring Campbell Agramonte: Treating Edrik Rundle/Extender: Brunetta Jeans in Treatment: 1 Clinic Level of Care Assessment  Items TOOL 4 Quantity Score X- 1 0 Use when only an EandM is performed on FOLLOW-UP visit ASSESSMENTS - Nursing Assessment / Reassessment X- 1 10 Reassessment of Co-morbidities (includes updates in patient status) X- 1 5 Reassessment of Adherence to Treatment Plan ASSESSMENTS - Wound and Skin Day ssessment / Reassessment X - Simple Wound Assessment / Reassessment - one wound 1 5 []  - 0 Complex Wound Assessment / Reassessment - multiple wounds X- 1 10 Dermatologic / Skin Assessment (not related to wound area) ASSESSMENTS - Focused Assessment X- 1 5 Circumferential Edema Measurements - multi extremities []  - 0 Nutritional Assessment / Counseling / Intervention Belinda Day ([id number]) [id number] Page 2 of 7 []  - 0 Lower Extremity Assessment (monofilament, tuning fork, pulses) []  - 0 Peripheral Arterial Disease Assessment (using hand held doppler) ASSESSMENTS - Ostomy and/or Continence Assessment and Care []  - 0 Incontinence Assessment and Management []  - 0 Ostomy Care Assessment and Management (repouching, etc.) PROCESS - Coordination of Care X - Simple Patient / Family Education for ongoing care 1 15 []  - 0 Complex (extensive) Patient / Family Education for ongoing care X- 1 10 Staff obtains Chiropractor, Records, T Results / Process Orders est []  - 0 Staff telephones HHA, Nursing Homes / Clarify orders / etc []  - 0 Routine Transfer to another Facility (non-emergent condition) []  - 0 Routine Hospital Admission (non-emergent condition) []  - 0 New Admissions / Manufacturing engineer / Ordering NPWT Apligraf, etc. , []  - 0 Emergency Hospital Admission (emergent condition) X- 1 10 Simple Discharge Coordination []  - 0 Complex (extensive) Discharge Coordination PROCESS - Special Needs []  - 0 Pediatric / Minor Patient Management []  - 0 Isolation Patient Management []  - 0 Hearing / Language / Visual special needs []  - 0 Assessment of  Community assistance (transportation, D/C planning, etc.) []  - 0 Additional assistance / Altered mentation []  - 0 Support Surface(s) Assessment (bed, cushion, seat, etc.) INTERVENTIONS -  Wound Cleansing / Measurement X - Simple Wound Cleansing - one wound 1 5 []  - 0 Complex Wound Cleansing - multiple wounds X- 1 5 Wound Imaging (photographs - any number of wounds) []  - 0 Wound Tracing (instead of photographs) X- 1 5 Simple Wound Measurement - one wound []  - 0 Complex Wound Measurement - multiple wounds INTERVENTIONS - Wound Dressings X - Small Wound Dressing one or multiple wounds 1 10 []  - 0 Medium Wound Dressing one or multiple wounds []  - 0 Large Wound Dressing one or multiple wounds X- 1 5 Application of Medications - topical []  - 0 Application of Medications - injection INTERVENTIONS - Miscellaneous []  - 0 External ear exam []  - 0 Specimen Collection (cultures, biopsies, blood, body fluids, etc.) []  - 0 Specimen(s) / Culture(s) sent or taken to Lab for analysis []  - 0 Patient Transfer (multiple staff / Nurse, adult / Similar devices) []  - 0 Simple Staple / Suture removal (25 or less) []  - 0 Complex Staple / Suture removal (26 or more) []  - 0 Hypo / Hyperglycemic Management (close monitor of Blood Glucose) Belinda Day, Belinda Day ([id number]) [id number] Page 3 of 7 []  - 0 Ankle / Brachial Index (ABI) - do not check if billed separately X- 1 5 Vital Signs Has the patient been seen at the hospital within the last three years: Yes Total Score: 105 Level Of Care: New/Established - Level 3 Electronic Signature(s) Signed: 11/03/2022 5:49:45 PM By: Shawn Stall RN, BSN Entered By: Shawn Stall on 11/03/2022 07:26:38 -------------------------------------------------------------------------------- Encounter Discharge Information Details Patient Name: Date of Service: Belinda Day. 11/03/2022 9:45 [name] Medical Record Number: [id number] Patient  Account Number: [id number] Date of Birth/Sex: Treating RN: 1945-07-30 (77 y.o. Belinda Day Primary Care Kymberley Raz: Tana Conch Other Clinician: Referring Rory Xiang: Treating Cierra Rothgeb/Extender: Brunetta Jeans in Treatment: 1 Encounter Discharge Information Items Discharge Condition: Stable Ambulatory Status: Ambulatory Discharge Destination: Home Transportation: Private Auto Accompanied By: family member Schedule Follow-up Appointment: Yes Clinical Summary of Care: Electronic Signature(s) Signed: 11/03/2022 5:49:45 PM By: Shawn Stall RN, BSN Entered By: Shawn Stall on 11/03/2022 07:27:20 -------------------------------------------------------------------------------- Lower Extremity Assessment Details Patient Name: Date of Service: Belinda Day. 11/03/2022 9:45 [name] Medical Record Number: [id number] Patient Account Number: [id number] Date of Birth/Sex: Treating RN: 1945/01/28 (77 y.o. F) Primary Care Veronique Warga: Tana Conch Other Clinician: Referring Erasmus Bistline: Treating Morio Widen/Extender: Brunetta Jeans in Treatment: 1 Edema Assessment Assessed: [Left: No] [Right: No] [Left: Edema] [Right: :] Calf Left: Right: Point of Measurement: From Medial Instep 33.6 cm Ankle Left: Right: Point of Measurement: From Medial Instep 19.8 cm Vascular Assessment Dimare, Edilia Day ([id number]) [Right:[id number] Page 4 of 7] Extremity colors, hair growth, and conditions: Extremity Color: [Left:Normal] Hair Growth on Extremity: [Left:Yes] Temperature of Extremity: [Left:Warm] Capillary Refill: [Left:< 3 seconds] Dependent Rubor: [Left:No No] Electronic Signature(s) Signed: 11/03/2022 5:41:53 PM By: Thayer Dallas Entered By: Thayer Dallas on 11/03/2022 06:52:31 -------------------------------------------------------------------------------- Multi-Disciplinary Care Plan Details Patient Name: Date of  Service: Belinda Day. 11/03/2022 9:45 [name] Medical Record Number: [id number] Patient Account Number: [id number] Date of Birth/Sex: Treating RN: 06-Jul-1945 (77 y.o. Belinda Day Primary Care Klaudia Beirne: Tana Conch Other Clinician: Referring Rafaela Dinius: Treating Kaiah Hosea/Extender: Brunetta Jeans in Treatment: 1 Active Inactive Wound/Skin Impairment Nursing Diagnoses: Impaired tissue integrity Goals: Patient/caregiver will verbalize understanding of skin care regimen Date Initiated: 10/27/2022 Target Resolution Date: 01/25/2023 Goal Status: Active Interventions: Assess ulceration(s)  every visit Treatment Activities: Skin care regimen initiated : 10/27/2022 Notes: Electronic Signature(s) Signed: 11/03/2022 5:49:45 PM By: Shawn Stall RN, BSN Entered By: Shawn Stall on 11/03/2022 07:25:42 -------------------------------------------------------------------------------- Pain Assessment Details Patient Name: Date of Service: Belinda Day. 11/03/2022 9:45 [name] Medical Record Number: [id number] Patient Account Number: [id number] Date of Birth/Sex: Treating RN: 20-Oct-1945 (77 y.o. F) Primary Care Maxmilian Trostel: Tana Conch Other Clinician: Referring Kortlynn Poust: Treating Daijanae Rafalski/Extender: Brunetta Jeans in Treatment: 1 Active Problems Location of Pain Severity and Description of Pain Belinda Day, Belinda Day ([id number]) [id number] Page 5 of 7 Patient Has Paino Yes Site Locations Pain Location: Pain in Ulcers Rate the pain. Current Pain Level: 1 Character of Pain Describe the Pain: Other: sore Pain Management and Medication Current Pain Management: Electronic Signature(s) Signed: 11/03/2022 5:41:53 PM By: Thayer Dallas Entered By: Thayer Dallas on 11/03/2022 06:52:17 -------------------------------------------------------------------------------- Patient/Caregiver Education Details Patient Name: Date  of Service: Jorge Ny 10/9/2024andnbsp9:45 [name] Medical Record Number: [id number] Patient Account Number: [id number] Date of Birth/Gender: Treating RN: 24-Aug-1945 (77 y.o. Belinda Day Primary Care Physician: Tana Conch Other Clinician: Referring Physician: Treating Physician/Extender: Brunetta Jeans in Treatment: 1 Education Assessment Education Provided To: Patient Education Topics Provided Wound/Skin Impairment: Handouts: Caring for Your Ulcer Methods: Explain/Verbal Responses: Reinforcements needed Electronic Signature(s) Signed: 11/03/2022 5:49:45 PM By: Shawn Stall RN, BSN Entered By: Shawn Stall on 11/03/2022 07:26:01 Belinda Day ([id number]) [id number] Page 6 of 7 -------------------------------------------------------------------------------- Wound Assessment Details Patient Name: Date of Service: Belinda Day, Belinda Day. 11/03/2022 9:45 [name] Medical Record Number: [id number] Patient Account Number: [id number] Date of Birth/Sex: Treating RN: 1945-08-07 (77 y.o. F) Primary Care Manika Hast: Tana Conch Other Clinician: Referring Senan Urey: Treating Aleksa Collinsworth/Extender: Brunetta Jeans in Treatment: 1 Wound Status Wound Number: 1 Primary Cellulitis Etiology: Wound Location: Left, Anterior Lower Leg Secondary Diabetic Wound/Ulcer of the Lower Extremity Wounding Event: Skin Tear/Laceration Etiology: Date Acquired: 08/28/2022 Wound Open Weeks Of Treatment: 1 Status: Clustered Wound: No Comorbid Chronic Obstructive Pulmonary Disease (COPD), Coronary History: Artery Disease, Hypertension, Type II Diabetes Photos Wound Measurements Length: (cm) 0.3 Width: (cm) 0.3 Depth: (cm) 0.1 Area: (cm) 0.071 Volume: (cm) 0.007 % Reduction in Area: 91% % Reduction in Volume: 91.1% Epithelialization: None Tunneling: No Undermining: No Wound Description Classification: Full Thickness  Without Exposed Support Structures Exudate Amount: Small Exudate Type: Serous Exudate Color: amber Foul Odor After Cleansing: No Slough/Fibrino Yes Wound Bed Granulation Amount: Small (1-33%) Exposed Structure Necrotic Amount: Large (67-100%) Fascia Exposed: No Necrotic Quality: Adherent Slough Fat Layer (Subcutaneous Tissue) Exposed: Yes Tendon Exposed: No Muscle Exposed: No Joint Exposed: No Bone Exposed: No Periwound Skin Texture Texture Color No Abnormalities Noted: No No Abnormalities Noted: No Scarring: Yes Moisture No Abnormalities Noted: No Treatment Notes Wound #1 (Lower Leg) Wound Laterality: Left, Anterior Cleanser Soap and Water Discharge Instruction: May shower and wash wound with dial antibacterial soap and water prior to dressing change. Vashe 5.8 (oz) Discharge Instruction: Cleanse the wound with Vashe prior to applying Day clean dressing using gauze sponges, not tissue or cotton balls. Peri-Wound Care Belinda Day, Belinda Day ([id number]) [id number] Page 7 of 7 Topical AandD ointment Discharge Instruction: apply directly to wound bed and around the wound. Primary Dressing Hydrofera Blue Ready Transfer Foam, 2.5x2.5 (in/in) Discharge Instruction: Apply directly to wound bed as directed Secondary Dressing Bordered Gauze, 2x3.75 in Discharge Instruction: Apply over primary dressing as directed. Secured With Compression Wrap Compression Stockings  Add-Ons Electronic Signature(s) Signed: 11/03/2022 5:41:53 PM By: Thayer Dallas Entered By: Thayer Dallas on 11/03/2022 06:54:44 -------------------------------------------------------------------------------- Vitals Details Patient Name: Date of Service: Belinda Day. 11/03/2022 9:45 [name] Medical Record Number: [id number] Patient Account Number: [id number] Date of Birth/Sex: Treating RN: Jun 08, 1945 (77 y.o. F) Primary Care Devina Bezold: Tana Conch Other Clinician: Referring  Zyara Riling: Treating Kyian Obst/Extender: Brunetta Jeans in Treatment: 1 Vital Signs Time Taken: 09:51 Temperature (F): 97.8 Height (in): 61 Pulse (bpm): 78 Weight (lbs): 113 Respiratory Rate (breaths/min): 18 Body Mass Index (BMI): 21.3 Blood Pressure (mmHg): 138/66 Reference Range: 80 - 120 mg / dl Electronic Signature(s) Signed: 11/03/2022 5:41:53 PM By: Thayer Dallas Entered By: Thayer Dallas on 11/03/2022 06:51:57

## 2022-11-05 ENCOUNTER — Ambulatory Visit: Payer: Medicare Other | Admitting: Family Medicine

## 2022-11-05 ENCOUNTER — Encounter: Payer: Self-pay | Admitting: Family Medicine

## 2022-11-05 VITALS — BP 132/74 | HR 76 | Temp 98.7°F | Ht 61.0 in | Wt 113.2 lb

## 2022-11-05 DIAGNOSIS — C4492 Squamous cell carcinoma of skin, unspecified: Secondary | ICD-10-CM | POA: Diagnosis not present

## 2022-11-05 DIAGNOSIS — S81802S Unspecified open wound, left lower leg, sequela: Secondary | ICD-10-CM | POA: Diagnosis not present

## 2022-11-05 MED ORDER — HYDROCODONE-ACETAMINOPHEN 5-325 MG PO TABS: 1.0000 | ORAL_TABLET | Freq: Four times a day (QID) | ORAL | 0 refills | Status: DC | PRN

## 2022-11-05 NOTE — Progress Notes (Signed)
Subjective  CC:  Chief Complaint  Patient presents with   Fall    Pt had a fall 2 months ago, pt would like to discuss findings from wound clinic and would like to be prescribed pain medication.Pt currently taking extra strength tylenol     Same day acute visit; PCP not available. New pt to me. Chart reviewed.  HPI: Belinda Day is a 77 y.o. female who presents to the office today to address the problems listed above in the chief complaint. 77 yo with multiple medical problems as listed below. I reviewed long course regarding traumatic injury to left lower leg. S/p biopsy revealing squamous cell carcinoma.  Reequesting pain medicine; reports severe pain since biopsy. Intolerant to tramadol due to nausea / vomiting. OTC tylenol no longer effective. No nsaids. Requests hydrocodone. Is to scheduled with derm for treatment of squamous cell carcinoma.  I reviewed her account (word document) of the history of this illness. See scan. Diabetes is controlled.   Assessment  1. Squamous cell carcinoma of skin   2. Leg wound, left, sequela      Plan  Leg wound/skin cancer:  healing wound but needs excision of SCC; will treat pain with hydrocodone. Risks/benefits and cautions advised.   Follow up: prn  01/06/2023  No orders of the defined types were placed in this encounter.  Meds ordered this encounter  Medications   HYDROcodone-acetaminophen (NORCO) 5-325 MG tablet    Sig: Take 1 tablet by mouth every 6 (six) hours as needed for moderate pain.    Dispense:  20 tablet    Refill:  0      I reviewed the patients updated PMH, FH, and SocHx.    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   Abnormal colonoscopy 12/05/2021   Hx of adenomatous colonic polyps 12/05/2021   Family history of colon cancer 12/05/2021   Aortic atherosclerosis (HCC) 05/28/2020    Type 2 diabetes mellitus without complication, without long-term current use of insulin (HCC) 09/07/2017   Cystitis 11/05/2016   Cerebrovascular disease 01/31/2015   Former smoker 08/05/2014   Hyperglycemia 08/05/2014   Constipation 05/02/2014   History of colonic polyps 05/02/2014   GERD (gastroesophageal reflux disease) 02/24/2014   Hemoptysis 02/24/2014   Benign paroxysmal positional vertigo 04/18/2013   Insomnia 02/12/2013   Diverticulitis 12/21/2012   Nocturnal hypoxemia 12/12/2012   Fibromyalgia 12/28/2011   Osteopenia 08/14/2011   Hypothyroidism 04/06/2010   MAI (mycobacterium avium-intracellulare) (HCC) 11/19/2009   Hyperlipemia 07/03/2007   Essential hypertension 07/03/2007   CAD (coronary artery disease) s/p CABG 07/03/2007   Obstructive bronchiectasis (HCC) with GOLD II/III criteria 07/03/2007   Current Meds  Medication Sig   HYDROcodone-acetaminophen (NORCO) 5-325 MG tablet Take 1 tablet by mouth every 6 (six) hours as needed for moderate pain.    Allergies: Patient is allergic to bactrim [sulfamethoxazole-trimethoprim], ciprofloxacin, codeine, erythromycin, levaquin [levofloxacin], and tramadol. Family History: Patient family history includes Arthritis in her father; Cancer in her father, maternal grandmother, and mother; Colon cancer in her father, maternal grandmother, and mother; Diabetes in her mother; Heart disease in her father and mother; Hyperlipidemia in her father and mother; Hypertension in her father and mother; Stroke in her father and mother; Vision loss in her father. Social History:  Patient  reports that she quit smoking about 42 years ago. Her smoking use included cigarettes. She  started smoking about 67 years ago. She has a 25 pack-year smoking history. She has never used smokeless tobacco. She reports that she does not drink alcohol and does not use drugs.  Review of Systems: Constitutional: Negative for fever malaise or anorexia Cardiovascular:  negative for chest pain Respiratory: negative for SOB or persistent cough Gastrointestinal: negative for abdominal pain  Objective  Vitals: BP 132/74   Pulse 76   Temp 98.7 F (37.1 C)   Ht 5\' 1"  (1.549 m)   Wt 113 lb 3.2 oz (51.3 kg)   SpO2 92%   BMI 21.39 kg/m  General: no acute distress , A&Ox3 Skin:  Warm, no rashes Left lower anterior leg: wound is mostly healed but for 1cm biopsy site: scabbed with crusting. No fluctuance.   Commons side effects, risks, benefits, and alternatives for medications and treatment plan prescribed today were discussed, and the patient expressed understanding of the given instructions. Patient is instructed to call or message via MyChart if he/she has any questions or concerns regarding our treatment plan. No barriers to understanding were identified. We discussed Red Flag symptoms and signs in detail. Patient expressed understanding regarding what to do in case of urgent or emergency type symptoms.  Medication list was reconciled, printed and provided to the patient in AVS. Patient instructions and summary information was reviewed with the patient as documented in the AVS. This note was prepared with assistance of Dragon voice recognition software. Occasional wrong-word or sound-a-like substitutions may have occurred due to the inherent limitations of voice recognition software

## 2022-11-10 ENCOUNTER — Other Ambulatory Visit: Payer: Self-pay | Admitting: Cardiology

## 2022-11-10 DIAGNOSIS — I10 Essential (primary) hypertension: Secondary | ICD-10-CM

## 2022-11-15 DIAGNOSIS — C44729 Squamous cell carcinoma of skin of left lower limb, including hip: Secondary | ICD-10-CM | POA: Diagnosis not present

## 2022-11-15 DIAGNOSIS — C44721 Squamous cell carcinoma of skin of unspecified lower limb, including hip: Secondary | ICD-10-CM | POA: Diagnosis not present

## 2022-11-16 ENCOUNTER — Telehealth: Payer: Self-pay

## 2022-11-16 NOTE — Telephone Encounter (Signed)
Transition Care Management Unsuccessful Follow-up Telephone Call  Date of discharge and from where:  Belinda Day 9/19 Attempts:  1st Attempt  Reason for unsuccessful TCM follow-up call:  No answer/busy   Belinda Day  [facility], [name], Phone: [phone] Website: [name]

## 2022-11-17 ENCOUNTER — Telehealth: Payer: Self-pay

## 2022-11-17 ENCOUNTER — Encounter (HOSPITAL_BASED_OUTPATIENT_CLINIC_OR_DEPARTMENT_OTHER): Payer: Medicare Other | Admitting: Physician Assistant

## 2022-11-17 DIAGNOSIS — J449 Chronic obstructive pulmonary disease, unspecified: Secondary | ICD-10-CM | POA: Diagnosis not present

## 2022-11-17 DIAGNOSIS — L03116 Cellulitis of left lower limb: Secondary | ICD-10-CM | POA: Diagnosis not present

## 2022-11-17 DIAGNOSIS — I10 Essential (primary) hypertension: Secondary | ICD-10-CM | POA: Diagnosis not present

## 2022-11-17 DIAGNOSIS — E11622 Type 2 diabetes mellitus with other skin ulcer: Secondary | ICD-10-CM | POA: Diagnosis not present

## 2022-11-17 DIAGNOSIS — L97822 Non-pressure chronic ulcer of other part of left lower leg with fat layer exposed: Secondary | ICD-10-CM | POA: Diagnosis not present

## 2022-11-17 DIAGNOSIS — C44729 Squamous cell carcinoma of skin of left lower limb, including hip: Secondary | ICD-10-CM | POA: Diagnosis not present

## 2022-11-17 NOTE — Progress Notes (Addendum)
RIKA, BEAM Day ([id number]) [id number] Page 1 of 6 Visit Report for 11/17/2022 Chief Complaint Document Details Patient Name: Date of Service: Belinda Day, Belinda Day. 11/17/2022 11:15 [name] Medical Record Number: [id number] Patient Account Number: [id number] Date of Birth/Sex: Treating RN: 1945-12-24 (77 y.o. F) Primary Care Provider: Tana Conch Other Clinician: Referring Provider: Treating Provider/Extender: Brunetta Jeans in Treatment: 3 Information Obtained from: Patient Chief Complaint Left LE Ulcer Electronic Signature(s) Signed: 11/17/2022 11:03:33 AM By: Allen Derry PA-C Entered By: Allen Derry on 11/17/2022 08:03:32 -------------------------------------------------------------------------------- HPI Details Patient Name: Date of Service: Belinda Pluck RO N Day. 11/17/2022 11:15 [name] Medical Record Number: [id number] Patient Account Number: [id number] Date of Birth/Sex: Treating RN: [birth year] (77 y.o. F) Primary Care Provider: Tana Conch Other Clinician: Referring Provider: Treating Provider/Extender: Brunetta Jeans in Treatment: 3 History of Present Illness HPI Description: 10/27/2022 upon evaluation today patient presents for initial inspection here in the clinic regarding Day wound over the left anterior lower extremity. T back up Day little bit and gives some history I am going to recount what the patient brought in written out on her sheet with her today which is an o excellent history of the course of this wound. On August 3 the patient sustained an injury to the left leg due to Day fall off of Day ladder. This was essentially Day deep cut and continue to bleed for about 4 days. On August 7 she saw Dr. Lenn Cal at [facility] and was started on Keflex at that point. Subsequently on August 14 she went back to [facility] where she was placed on doxycycline 100 mg for 2 rounds. The next  time that she was evaluated was actually about 3 weeks later when back at [facility] September 6 she was sent to the hospital due to what appeared to be worsening and started on vancomycin IV. September 7 she was actually discharged from the hospital with linezolid 600 mg. On September 18 she saw Dr. Anselm Jungling who recommended the patient go to [facility] long the next morning. On September 19 she saw infectious disease and they ordered an MRI as well as putting the patient on Dalvance. This was by IV. The next appointment with infectious disease was September 26 at that point it was noted that the patient had Day number MRI and the results of that were reported out on 10-14-2022 which showed that she had soft tissue swelling of the distal lower leg with focal skin thickening and enhancement anteriorly consistent with cellulitis no abscess. There was also Day thin periosteal edema and enhancement along the anterior medial aspect of the distal tibia this is stated to be likely reactive no definitive osteomyelitis noted. Subsequently she states that she did follow-up requesting to go to the wound center for further evaluation and was referred to [place] today October 2 for evaluation in the meantime she has been using Silvadene cream. Her most recent hemoglobin A1c was 6.6 she does have diabetes but otherwise no major medical problems other than hypertension and COPD that would affect her ability to heal. 11-03-2022 upon evaluation today patient appears to be doing okay currently in regard to her wound. I did actually perform Day biopsy last week and came back positive for squamous cell carcinoma. With that being said I discussed with the patient that she is likely can require Day full excision of this area and I recommended that she may need to go to  the skin surgery center unless we get her in with her dermatologist sooner. I am not sure if they have Day Mohs surgeon at [facility] dermatology or not. With that  being said we will get Day see about get in touch with them and finding out which I think will definitely be of benefit if so because it could be something when she gets in much faster. 11-17-2022 upon evaluation today patient presents for follow-up I last saw her on October 9 since then she actually have the mobile skin surgery on November 15, 2022 that she has been 2 days ago. This actually went very well the area that they had to remove was not nearly as large as I was fearing this is also and excellent. With that being said I do believe the patient is actually making progress here already towards seeing this improvement although again it is much too early to tell that it is actually healing currently. There is Day lot of bleeding we want Day make sure that this does not turn into just Day blood clot or harder scabs on the use Xeroform gauze at this point helps keep this soft over the next week we will see where things stand following. I am very pleased however they were able to get her in so clinically that is awesome and I do appreciate that. Belinda Day ([id number]) [id number] Page 2 of 6 Electronic Signature(s) Signed: 11/17/2022 11:27:08 AM By: Allen Derry PA-C Entered By: Allen Derry on 11/17/2022 08:27:07 -------------------------------------------------------------------------------- Physical Exam Details Patient Name: Date of Service: Belinda Pluck RO N Day. 11/17/2022 11:15 [name] Medical Record Number: [id number] Patient Account Number: [id number] Date of Birth/Sex: Treating RN: [birth year] (77 y.o. F) Primary Care Provider: Tana Conch Other Clinician: Referring Provider: Treating Provider/Extender: Brunetta Jeans in Treatment: 3 Constitutional Well-nourished and well-hydrated in no acute distress. Respiratory normal breathing without difficulty. Psychiatric this patient is able to make decisions and demonstrates good insight into  disease process. Alert and Oriented x 3. pleasant and cooperative. Notes Upon inspection patient's wound bed actually showed signs of good granulation epithelization at this point. Fortunately I do not see any signs of worsening overall I do believe that the patient is doing excellent following the Mohs surgery the area does have some bleeding that is kind of clotted in the base of the wound but is not too dry as of yet I think Xeroform gauze is probably good to be the way to go at this point. Electronic Signature(s) Signed: 11/17/2022 11:27:30 AM By: Allen Derry PA-C Entered By: Allen Derry on 11/17/2022 08:27:30 -------------------------------------------------------------------------------- Physician Orders Details Patient Name: Date of Service: Belinda Pluck RO N Day. 11/17/2022 11:15 [name] Medical Record Number: [id number] Patient Account Number: [id number] Date of Birth/Sex: Treating RN: [birth year] (77 y.o. Roselee Nova, Jamie Primary Care Provider: Tana Conch Other Clinician: Referring Provider: Treating Provider/Extender: Brunetta Jeans in Treatment: 3 Verbal / Phone Orders: No Diagnosis Coding ICD-10 Coding Code Description [phone] Laceration without foreign body, left lower leg, initial encounter L03.116 Cellulitis of left lower limb E11.622 Type 2 diabetes mellitus with other skin ulcer L97.822 Non-pressure chronic ulcer of other part of left lower leg with fat layer exposed I10 Essential (primary) hypertension J44.89 Other specified chronic obstructive pulmonary disease Follow-up Appointments ppointment in 2 weeks. Allen Derry PA Wednesday room 7 Return Day Other: - Referral to [facility]- call them if you have not heard from them with  an appointment by Friday. address: [address] Phone # [phone] RASHAWN, BRASIER ([id number]) [id number] Page 3 of 6 Will send you primary care  provider Day note requesting you Day pain medication for pain to left leg. Will call [facility] Dermatology office and let them know you have been diagnosed with skin Ca and see if they can perform Moh's surgery and if not will send you to Skin Surgery center. Purchase over the counter AandD ointment apply over the area before hydrofera blue. Anesthetic (In clinic) Topical Lidocaine 5% applied to wound bed Bathing/ Shower/ Hygiene May shower and wash wound with soap and water. - Keep bandage on when in shower, at the end of shower remove dressing and wash with dial Antibacterial soap. After shower, gentle pat dry and apply Xeroform and form border dressing Edema Control - Lymphedema / SCD / Other Left Lower Extremity Elevate legs to the level of the heart or above for 30 minutes daily and/or when sitting for 3-4 times Day day throughout the day. - When sitting , elevate legs. Avoid standing for long periods of time. Exercise regularly - As tolerated Additional Orders / Instructions Other: - Punch Biopsy sent to [facility] labs. (10/27/22) Wound Treatment Wound #1 - Lower Leg Wound Laterality: Left, Anterior Cleanser: Soap and Water 1 x Per Day/30 Days Discharge Instructions: May shower and wash wound with dial antibacterial soap and water prior to dressing change. Cleanser: Vashe 5.8 (oz) 1 x Per Day/30 Days Discharge Instructions: Cleanse the wound with Vashe prior to applying Day clean dressing using gauze sponges, not tissue or cotton balls. Prim Dressing: Xeroform Occlusive Gauze Dressing, 4x4 in (DME) (Generic) 1 x Per Day/30 Days ary Discharge Instructions: Apply to wound bed as instructed Secondary Dressing: Zetuvit Plus Silicone Border Dressing 4x4 (in/in) (DME) (Generic) 1 x Per Day/30 Days Discharge Instructions: Apply silicone border over primary dressing as directed. Electronic Signature(s) Signed: 11/17/2022 4:45:44 PM By: Allen Derry PA-C Signed: 11/24/2022 3:10:04 PM By: Tommie Ard  RN Entered By: Tommie Ard on 11/17/2022 08:25:06 -------------------------------------------------------------------------------- Problem List Details Patient Name: Date of Service: Belinda Pluck RO N Day. 11/17/2022 11:15 [name] Medical Record Number: [id number] Patient Account Number: [id number] Date of Birth/Sex: Treating RN: 1945-10-28 (77 y.o. F) Primary Care Provider: Tana Conch Other Clinician: Referring Provider: Treating Provider/Extender: Brunetta Jeans in Treatment: 3 Active Problems ICD-10 Encounter Code Description Active Date MDM Diagnosis S81.812A Laceration without foreign body, left lower leg, initial encounter 10/27/2022 No Yes L03.116 Cellulitis of left lower limb 10/27/2022 No Yes E11.622 Type 2 diabetes mellitus with other skin ulcer 10/27/2022 No Yes [place], Jana Day ([id number]) [phone].pdf Page 4 of 6 [phone] Non-pressure chronic ulcer of other part of left lower leg with fat layer exposed10/02/2022 No Yes I10 Essential (primary) hypertension 10/27/2022 No Yes J44.89 Other specified chronic obstructive pulmonary disease 10/27/2022 No Yes Inactive Problems Resolved Problems Electronic Signature(s) Signed: 11/17/2022 11:03:22 AM By: Allen Derry PA-C Entered By: Allen Derry on 11/17/2022 08:03:22 -------------------------------------------------------------------------------- Progress Note Details Patient Name: Date of Service: Belinda Pluck RO N Day. 11/17/2022 11:15 [name] Medical Record Number: [id number] Patient Account Number: [id number] Date of Birth/Sex: Treating RN: [birth year] (77 y.o. F) Primary Care Provider: Tana Conch Other Clinician: Referring Provider: Treating Provider/Extender: Brunetta Jeans in Treatment: 3 Subjective Chief Complaint Information obtained from Patient Left LE Ulcer History of Present Illness (HPI) 10/27/2022 upon evaluation today patient presents for  initial inspection here in the clinic regarding Day  wound over the left anterior lower extremity. T back up Day o little bit and gives some history I am going to recount what the patient brought in written out on her sheet with her today which is an excellent history of the course of this wound. On August 3 the patient sustained an injury to the left leg due to Day fall off of Day ladder. This was essentially Day deep cut and continue to bleed for about 4 days. On August 7 she saw Dr. Lenn Cal at [facility] and was started on Keflex at that point. Subsequently on August 14 she went back to [facility] where she was placed on doxycycline 100 mg for 2 rounds. The next time that she was evaluated was actually about 3 weeks later when back at [facility] September 6 she was sent to the hospital due to what appeared to be worsening and started on vancomycin IV. September 7 she was actually discharged from the hospital with linezolid 600 mg. On September 18 she saw Dr. Anselm Jungling who recommended the patient go to [facility] long the next morning. On September 19 she saw infectious disease and they ordered an MRI as well as putting the patient on Dalvance. This was by IV. The next appointment with infectious disease was September 26 at that point it was noted that the patient had Day number MRI and the results of that were reported out on 10-14-2022 which showed that she had soft tissue swelling of the distal lower leg with focal skin thickening and enhancement anteriorly consistent with cellulitis no abscess. There was also Day thin periosteal edema and enhancement along the anterior medial aspect of the distal tibia this is stated to be likely reactive no definitive osteomyelitis noted. Subsequently she states that she did follow-up requesting to go to the wound center for further evaluation and was referred to [place] today October 2 for evaluation in the meantime she has been using Silvadene cream.  Her most recent hemoglobin A1c was 6.6 she does have diabetes but otherwise no major medical problems other than hypertension and COPD that would affect her ability to heal. 11-03-2022 upon evaluation today patient appears to be doing okay currently in regard to her wound. I did actually perform Day biopsy last week and came back positive for squamous cell carcinoma. With that being said I discussed with the patient that she is likely can require Day full excision of this area and I recommended that she may need to go to the skin surgery center unless we get her in with her dermatologist sooner. I am not sure if they have Day Mohs surgeon at [facility] dermatology or not. With that being said we will get Day see about get in touch with them and finding out which I think will definitely be of benefit if so because it could be something when she gets in much faster. 11-17-2022 upon evaluation today patient presents for follow-up I last saw her on October 9 since then she actually have the mobile skin surgery on November 15, 2022 that she has been 2 days ago. This actually went very well the area that they had to remove was not nearly as large as I was fearing this is also and excellent. With that being said I do believe the patient is actually making progress here already towards seeing this improvement although again it is much too early to tell that it is actually healing currently. There is Day lot of bleeding we  want Day make sure that this does not turn into just Day blood clot or harder scabs on the use Xeroform gauze at this point helps keep this soft over the next week we will see where things stand following. I am very pleased however they were able to get her in so clinically that is awesome and I do appreciate that. DAMYLA, LINNEHAN Day ([id number]) [id number] Page 5 of 6 Objective Constitutional Well-nourished and well-hydrated in no acute distress. Vitals Time Taken: 10:58 AM,  Height: 61 in, Weight: 113 lbs, BMI: 21.3, Temperature: 98.3 F, Pulse: 75 bpm, Respiratory Rate: 18 breaths/min, Blood Pressure: 151/66 mmHg. Respiratory normal breathing without difficulty. Psychiatric this patient is able to make decisions and demonstrates good insight into disease process. Alert and Oriented x 3. pleasant and cooperative. General Notes: Upon inspection patient's wound bed actually showed signs of good granulation epithelization at this point. Fortunately I do not see any signs of worsening overall I do believe that the patient is doing excellent following the Mohs surgery the area does have some bleeding that is kind of clotted in the base of the wound but is not too dry as of yet I think Xeroform gauze is probably good to be the way to go at this point. Integumentary (Hair, Skin) Wound #1 status is Open. Original cause of wound was Skin T ear/Laceration. The date acquired was: 08/28/2022. The wound has been in treatment 3 weeks. The wound is located on the Left,Anterior Lower Leg. The wound measures 2cm length x 2cm width x 0.3cm depth; 3.142cm^2 area and 0.942cm^3 volume. There is Fat Layer (Subcutaneous Tissue) exposed. There is no tunneling or undermining noted. There is Day small amount of serous drainage noted. There is small (1- 33%) granulation within the wound bed. There is Day large (67-100%) amount of necrotic tissue within the wound bed including Eschar and Adherent Slough. The periwound skin appearance exhibited: Scarring. Assessment Active Problems ICD-10 Laceration without foreign body, left lower leg, initial encounter Cellulitis of left lower limb Type 2 diabetes mellitus with other skin ulcer Non-pressure chronic ulcer of other part of left lower leg with fat layer exposed Essential (primary) hypertension Other specified chronic obstructive pulmonary disease Plan Follow-up Appointments: Return Appointment in 2 weeks. Allen Derry PA Wednesday room 7 Other: -  Referral to [facility]- call them if you have not heard from them with an appointment by Friday. address: [address] Phone # [phone] Will send you primary care provider Day note requesting you Day pain medication for pain to left leg. Will call [facility] Dermatology office and let them know you have been diagnosed with skin Ca and see if they can perform Moh's surgery and if not will send you to Skin Surgery center. Purchase over the counter AandD ointment apply over the area before hydrofera blue. Anesthetic: (In clinic) Topical Lidocaine 5% applied to wound bed Bathing/ Shower/ Hygiene: May shower and wash wound with soap and water. - Keep bandage on when in shower, at the end of shower remove dressing and wash with dial Antibacterial soap. After shower, gentle pat dry and apply Xeroform and form border dressing Edema Control - Lymphedema / SCD / Other: Elevate legs to the level of the heart or above for 30 minutes daily and/or when sitting for 3-4 times Day day throughout the day. - When sitting , elevate legs. Avoid standing for long periods of time. Exercise regularly - As tolerated Additional Orders / Instructions: Other: -  Punch Biopsy sent to [facility] labs. (10/27/22) WOUND #1: - Lower Leg Wound Laterality: Left, Anterior Cleanser: Soap and Water 1 x Per Day/30 Days Discharge Instructions: May shower and wash wound with dial antibacterial soap and water prior to dressing change. Cleanser: Vashe 5.8 (oz) 1 x Per Day/30 Days Discharge Instructions: Cleanse the wound with Vashe prior to applying Day clean dressing using gauze sponges, not tissue or cotton balls. Prim Dressing: Xeroform Occlusive Gauze Dressing, 4x4 in (DME) (Generic) 1 x Per Day/30 Days ary Discharge Instructions: Apply to wound bed as instructed Secondary Dressing: Zetuvit Plus Silicone Border Dressing 4x4 (in/in) (DME) (Generic) 1 x Per Day/30 Days Discharge Instructions: Apply silicone  border over primary dressing as directed. 1. I would recommend that the patient should use Xeroform gauze dressing to the wound bed double layered over the next week she can shower daily and I explained to her how to appropriately wash in the shower. 2. Also can recommend the patient should continue with the bordered foam dressing to cover. 3. I am also can recommend that she should continue to monitor for any signs of infection or worsening. Based on what I see I think she is making really good VOLENA, DILDAY Day ([id number]) [id number] Page 6 of 6 progress towards closure which is great news. I am very pleased with the results of the Mohs surgery and the fact that this was not any larger than it is. We will see patient back for reevaluation in 1 week here in the clinic. If anything worsens or changes patient will contact our office for additional recommendations. Electronic Signature(s) Signed: 11/17/2022 11:28:12 AM By: Allen Derry PA-C Entered By: Allen Derry on 11/17/2022 08:28:12 -------------------------------------------------------------------------------- SuperBill Details Patient Name: Date of Service: Belinda Pluck RO N Day. 11/17/2022 Medical Record Number: [id number] Patient Account Number: [id number] Date of Birth/Sex: Treating RN: 31-Dec-1945 (77 y.o. Roselee Nova, Jamie Primary Care Provider: Tana Conch Other Clinician: Referring Provider: Treating Provider/Extender: Brunetta Jeans in Treatment: 3 Diagnosis Coding ICD-10 Codes Code Description [phone] Laceration without foreign body, left lower leg, initial encounter L03.116 Cellulitis of left lower limb E11.622 Type 2 diabetes mellitus with other skin ulcer L97.822 Non-pressure chronic ulcer of other part of left lower leg with fat layer exposed I10 Essential (primary) hypertension J44.89 Other specified chronic obstructive pulmonary disease Facility Procedures : CPT4  Code: 13086578 Description: 99213 - WOUND CARE VISIT-LEV 3 EST PT Modifier: 25 Quantity: 1 Physician Procedures : CPT4 Code Description Modifier [id number] 99213 - WC PHYS LEVEL 3 - EST PT ICD-10 Diagnosis Description S81.812A Laceration without foreign body, left lower leg, initial encounter L03.116 Cellulitis of left lower limb E11.622 Type 2 diabetes mellitus with  other skin ulcer L97.822 Non-pressure chronic ulcer of other part of left lower leg with fat layer exposed Quantity: 1 Electronic Signature(s) Signed: 11/17/2022 11:28:26 AM By: Allen Derry PA-C Entered By: Allen Derry on 11/17/2022 28:41:32

## 2022-11-17 NOTE — Telephone Encounter (Signed)
Transition Care Management Follow-up Telephone Call Date of discharge and from where: [facility] 9/19 How have you been since you were released from the hospital? Doing ok and has followed up with wound care center, has had surgery. Patient is following up with all Providers Any questions or concerns? No  Items Reviewed: Did the pt receive and understand the discharge instructions provided? Yes  Medications obtained and verified? Yes  Other? No  Any new allergies since your discharge? No  Dietary orders reviewed? No Do you have support at home? Yes     Follow up appointments reviewed:  PCP Hospital f/u appt confirmed? Yes  Scheduled to see  on  @ . Specialist Hospital f/u appt confirmed? Yes  Scheduled to see  on  @ . Are transportation arrangements needed? No  If their condition worsens, is the pt aware to call PCP or go to the Emergency Dept.? Yes Was the patient provided with contact information for the PCP's office or ED? Yes Was to pt encouraged to call back with questions or concerns? Yes

## 2022-11-19 DIAGNOSIS — E119 Type 2 diabetes mellitus without complications: Secondary | ICD-10-CM | POA: Diagnosis not present

## 2022-11-19 DIAGNOSIS — H5203 Hypermetropia, bilateral: Secondary | ICD-10-CM | POA: Diagnosis not present

## 2022-11-19 DIAGNOSIS — D3131 Benign neoplasm of right choroid: Secondary | ICD-10-CM | POA: Diagnosis not present

## 2022-11-19 DIAGNOSIS — H52203 Unspecified astigmatism, bilateral: Secondary | ICD-10-CM | POA: Diagnosis not present

## 2022-11-19 DIAGNOSIS — H5213 Myopia, bilateral: Secondary | ICD-10-CM | POA: Diagnosis not present

## 2022-11-19 LAB — HM DIABETES EYE EXAM

## 2022-11-22 ENCOUNTER — Encounter: Payer: Self-pay | Admitting: Family Medicine

## 2022-11-24 ENCOUNTER — Encounter (HOSPITAL_BASED_OUTPATIENT_CLINIC_OR_DEPARTMENT_OTHER): Payer: Medicare Other | Admitting: Physician Assistant

## 2022-11-24 DIAGNOSIS — E11622 Type 2 diabetes mellitus with other skin ulcer: Secondary | ICD-10-CM | POA: Diagnosis not present

## 2022-11-24 DIAGNOSIS — I10 Essential (primary) hypertension: Secondary | ICD-10-CM | POA: Diagnosis not present

## 2022-11-24 DIAGNOSIS — S81802A Unspecified open wound, left lower leg, initial encounter: Secondary | ICD-10-CM | POA: Diagnosis not present

## 2022-11-24 DIAGNOSIS — J449 Chronic obstructive pulmonary disease, unspecified: Secondary | ICD-10-CM | POA: Diagnosis not present

## 2022-11-24 DIAGNOSIS — L97822 Non-pressure chronic ulcer of other part of left lower leg with fat layer exposed: Secondary | ICD-10-CM | POA: Diagnosis not present

## 2022-11-24 DIAGNOSIS — C44729 Squamous cell carcinoma of skin of left lower limb, including hip: Secondary | ICD-10-CM | POA: Diagnosis not present

## 2022-11-24 DIAGNOSIS — L03116 Cellulitis of left lower limb: Secondary | ICD-10-CM | POA: Diagnosis not present

## 2022-11-24 NOTE — Progress Notes (Addendum)
[name], [name] ([id number]) [id number] Page 1 of 6 Visit Report for 11/24/2022 Chief Complaint Document Details Patient Name: Date of Service: Belinda Day 11/24/2022 10:15 A M Medical Record Number: [id number] Patient Account Number: [id number] Date of Birth/Sex: Treating RN: 1945/06/28 (77 y.o. F) Primary Care Provider: Tana Conch Other Clinician: Referring Provider: Treating Provider/Extender: Brunetta Jeans in Treatment: 4 Information Obtained from: Patient Chief Complaint Left LE Ulcer Electronic Signature(s) Signed: 11/24/2022 10:15:48 AM By: Allen Derry PA-C Entered By: Allen Derry on 11/24/2022 07:15:48 -------------------------------------------------------------------------------- HPI Details Patient Name: Date of Service: Belinda Day RO N A. 11/24/2022 10:15 A M Medical Record Number: [id number] Patient Account Number: [id number] Date of Birth/Sex: Treating RN: [birth year] (77 y.o. F) Primary Care Provider: Tana Conch Other Clinician: Referring Provider: Treating Provider/Extender: Brunetta Jeans in Treatment: 4 History of Present Illness HPI Description: 10/27/2022 upon evaluation today patient presents for initial inspection here in the clinic regarding a wound over the left anterior lower extremity. T back up a little bit and gives some history I am going to recount what the patient brought in written out on her sheet with her today which is an o excellent history of the course of this wound. On August 3 the patient sustained an injury to the left leg due to a fall off of a ladder. This was essentially a deep cut and continue to bleed for about 4 days. On August 7 she saw Dr. Lenn Cal at [facility] and was started on Keflex at that point. Subsequently on August 14 she went back to [facility] where she was placed on doxycycline 100 mg for 2 rounds. The next  time that she was evaluated was actually about 3 weeks later when back at [facility] September 6 she was sent to the hospital due to what appeared to be worsening and started on vancomycin IV. September 7 she was actually discharged from the hospital with linezolid 600 mg. On September 18 she saw Dr. Anselm Jungling who recommended the patient go to [facility] long the next morning. On September 19 she saw infectious disease and they ordered an MRI as well as putting the patient on Dalvance. This was by IV. The next appointment with infectious disease was September 26 at that point it was noted that the patient had a number MRI and the results of that were reported out on 10-14-2022 which showed that she had soft tissue swelling of the distal lower leg with focal skin thickening and enhancement anteriorly consistent with cellulitis no abscess. There was also a thin periosteal edema and enhancement along the anterior medial aspect of the distal tibia this is stated to be likely reactive no definitive osteomyelitis noted. Subsequently she states that she did follow-up requesting to go to the wound center for further evaluation and was referred to [place] today October 2 for evaluation in the meantime she has been using Silvadene cream. Her most recent hemoglobin A1c was 6.6 she does have diabetes but otherwise no major medical problems other than hypertension and COPD that would affect her ability to heal. 11-03-2022 upon evaluation today patient appears to be doing okay currently in regard to her wound. I did actually perform a biopsy last week and came back positive for squamous cell carcinoma. With that being said I discussed with the patient that she is likely can require a full excision of this area and I recommended that she may need to go to  the skin surgery center unless we get her in with her dermatologist sooner. I am not sure if they have a Mohs surgeon at [facility] dermatology or not. With that  being said we will get a see about get in touch with them and finding out which I think will definitely be of benefit if so because it could be something when she gets in much faster. 11-17-2022 upon evaluation today patient presents for follow-up I last saw her on October 9 since then she actually have the mobile skin surgery on November 15, 2022 that she has been 2 days ago. This actually went very well the area that they had to remove was not nearly as large as I was fearing this is also and excellent. With that being said I do believe the patient is actually making progress here already towards seeing this improvement although again it is much too early to tell that it is actually healing currently. There is a lot of bleeding we want a make sure that this does not turn into just a blood clot or harder scabs on the use Xeroform gauze at this point helps keep this soft over the next week we will see where things stand following. I am very pleased however they were able to get her in so clinically that is awesome and I do appreciate that. 11-24-2022 upon evaluation patient's wound is showing signs of improvement this does seem to be improving as far as the overall appearance of the wound is Belinda Day, Belinda Day A ([id number]) [phone].pdf Page 2 of 6 concerned. I feel like that it is slowly clearing up to a good surface underneath were not quite there yet but at the same time I think Xeroform is doing a good job. Electronic Signature(s) Signed: 11/24/2022 10:49:16 AM By: Allen Derry PA-C Entered By: Allen Derry on 11/24/2022 07:49:16 -------------------------------------------------------------------------------- Physical Exam Details Patient Name: Date of Service: Belinda Day RO N A. 11/24/2022 10:15 A M Medical Record Number: [id number] Patient Account Number: [id number] Date of Birth/Sex: Treating RN: 08/12/1945 (77 y.o. F) Primary Care Provider: Tana Conch Other  Clinician: Referring Provider: Treating Provider/Extender: Brunetta Jeans in Treatment: 4 Constitutional Well-nourished and well-hydrated in no acute distress. Respiratory normal breathing without difficulty. Psychiatric this patient is able to make decisions and demonstrates good insight into disease process. Alert and Oriented x 3. pleasant and cooperative. Notes Fortunately I do not see Any evidence of worsening overall and I do believe that the patient is making headway towards closure. Upon inspection patient's wound bed actually showed signs of good granulation epithelization at this point. she is doing well following the Mohs surgery. Electronic Signature(s) Signed: 11/24/2022 10:49:49 AM By: Allen Derry PA-C Entered By: Allen Derry on 11/24/2022 07:49:49 -------------------------------------------------------------------------------- Physician Orders Details Patient Name: Date of Service: Belinda Day RO N A. 11/24/2022 10:15 A M Medical Record Number: [id number] Patient Account Number: [id number] Date of Birth/Sex: Treating RN: 01-14-1946 (77 y.o. Debara Pickett, Yvonne Kendall Primary Care Provider: Tana Conch Other Clinician: Referring Provider: Treating Provider/Extender: Brunetta Jeans in Treatment: 4 The following information was scribed by: Shawn Stall The information was scribed for: Lenda Kelp Verbal / Phone Orders: No Diagnosis Coding ICD-10 Coding Code Description [phone] Laceration without foreign body, left lower leg, initial encounter L03.116 Cellulitis of left lower limb E11.622 Type 2 diabetes mellitus with other skin ulcer L97.822 Non-pressure chronic ulcer of other part of left lower leg with fat layer exposed I10 Essential (primary) hypertension  J44.89 Other specified chronic obstructive pulmonary disease ESCARLET, SAATHOFF A ([id number]) [id number] Page 3 of 6 Follow-up  Appointments ppointment in 1 week. Allen Derry PA Wednesday room 7 12/01/2022 1030 Return A ppointment in 2 weeks. Allen Derry PA Wednesday room 7 (front office to schedule) Return A Other: - may pick up at [facility] for xeroform. Anesthetic (In clinic) Topical Lidocaine 5% applied to wound bed Bathing/ Shower/ Hygiene May shower and wash wound with soap and water. - Keep bandage on when in shower, at the end of shower remove dressing and wash with dial Antibacterial soap. After shower, gentle pat dry and apply Xeroform and form border dressing Additional Orders / Instructions Other: - Punch Biopsy sent to [facility] labs. (10/27/22) Wound Treatment Wound #1 - Lower Leg Wound Laterality: Left, Anterior Cleanser: Soap and Water 1 x Per Day/30 Days Discharge Instructions: May shower and wash wound with dial antibacterial soap and water prior to dressing change. Cleanser: Vashe 5.8 (oz) 1 x Per Day/30 Days Discharge Instructions: Cleanse the wound with Vashe prior to applying a clean dressing using gauze sponges, not tissue or cotton balls. Prim Dressing: Xeroform Occlusive Gauze Dressing, 4x4 in (Generic) 1 x Per Day/30 Days ary Discharge Instructions: Apply to wound bed as instructed Secondary Dressing: Zetuvit Plus Silicone Border Dressing 4x4 (in/in) (Generic) 1 x Per Day/30 Days Discharge Instructions: Apply silicone border over primary dressing as directed. Electronic Signature(s) Signed: 11/24/2022 4:33:40 PM By: Allen Derry PA-C Signed: 11/25/2022 6:03:32 PM By: Shawn Stall RN, BSN Entered By: Shawn Stall on 11/24/2022 07:40:17 -------------------------------------------------------------------------------- Problem List Details Patient Name: Date of Service: Belinda Day RO N A. 11/24/2022 10:15 A M Medical Record Number: [id number] Patient Account Number: [id number] Date of Birth/Sex: Treating RN: 08-06-1945 (77 y.o. F) Primary Care Provider: Tana Conch Other Clinician: Referring Provider: Treating Provider/Extender: Brunetta Jeans in Treatment: 4 Active Problems ICD-10 Encounter Code Description Active Date MDM Diagnosis S81.812A Laceration without foreign body, left lower leg, initial encounter 10/27/2022 No Yes L03.116 Cellulitis of left lower limb 10/27/2022 No Yes E11.622 Type 2 diabetes mellitus with other skin ulcer 10/27/2022 No Yes L97.822 Non-pressure chronic ulcer of other part of left lower leg with fat layer exposed10/02/2022 No Yes I10 Essential (primary) hypertension 10/27/2022 No Yes Bollman, Margrit A ([id number]) [id number] Page 4 of 6 J44.89 Other specified chronic obstructive pulmonary disease 10/27/2022 No Yes Inactive Problems Resolved Problems Electronic Signature(s) Signed: 11/24/2022 10:15:40 AM By: Allen Derry PA-C Entered By: Allen Derry on 11/24/2022 07:15:40 -------------------------------------------------------------------------------- Progress Note Details Patient Name: Date of Service: Belinda Day RO N A. 11/24/2022 10:15 A M Medical Record Number: [id number] Patient Account Number: [id number] Date of Birth/Sex: Treating RN: 09-Jun-1945 (77 y.o. F) Primary Care Provider: Tana Conch Other Clinician: Referring Provider: Treating Provider/Extender: Brunetta Jeans in Treatment: 4 Subjective Chief Complaint Information obtained from Patient Left LE Ulcer History of Present Illness (HPI) 10/27/2022 upon evaluation today patient presents for initial inspection here in the clinic regarding a wound over the left anterior lower extremity. T back up a o little bit and gives some history I am going to recount what the patient brought in written out on her sheet with her today which is an excellent history of the course of this wound. On August 3 the patient sustained an injury to the left leg due to a fall off of a ladder. This  was essentially a deep cut and continue to bleed  for about 4 days. On August 7 she saw Dr. Lenn Cal at [facility] and was started on Keflex at that point. Subsequently on August 14 she went back to [facility] where she was placed on doxycycline 100 mg for 2 rounds. The next time that she was evaluated was actually about 3 weeks later when back at [facility] September 6 she was sent to the hospital due to what appeared to be worsening and started on vancomycin IV. September 7 she was actually discharged from the hospital with linezolid 600 mg. On September 18 she saw Dr. Anselm Jungling who recommended the patient go to [facility] long the next morning. On September 19 she saw infectious disease and they ordered an MRI as well as putting the patient on Dalvance. This was by IV. The next appointment with infectious disease was September 26 at that point it was noted that the patient had a number MRI and the results of that were reported out on 10-14-2022 which showed that she had soft tissue swelling of the distal lower leg with focal skin thickening and enhancement anteriorly consistent with cellulitis no abscess. There was also a thin periosteal edema and enhancement along the anterior medial aspect of the distal tibia this is stated to be likely reactive no definitive osteomyelitis noted. Subsequently she states that she did follow-up requesting to go to the wound center for further evaluation and was referred to [place] today October 2 for evaluation in the meantime she has been using Silvadene cream. Her most recent hemoglobin A1c was 6.6 she does have diabetes but otherwise no major medical problems other than hypertension and COPD that would affect her ability to heal. 11-03-2022 upon evaluation today patient appears to be doing okay currently in regard to her wound. I did actually perform a biopsy last week and came back positive for squamous cell carcinoma. With that being said I  discussed with the patient that she is likely can require a full excision of this area and I recommended that she may need to go to the skin surgery center unless we get her in with her dermatologist sooner. I am not sure if they have a Mohs surgeon at [facility] dermatology or not. With that being said we will get a see about get in touch with them and finding out which I think will definitely be of benefit if so because it could be something when she gets in much faster. 11-17-2022 upon evaluation today patient presents for follow-up I last saw her on October 9 since then she actually have the mobile skin surgery on November 15, 2022 that she has been 2 days ago. This actually went very well the area that they had to remove was not nearly as large as I was fearing this is also and excellent. With that being said I do believe the patient is actually making progress here already towards seeing this improvement although again it is much too early to tell that it is actually healing currently. There is a lot of bleeding we want a make sure that this does not turn into just a blood clot or harder scabs on the use Xeroform gauze at this point helps keep this soft over the next week we will see where things stand following. I am very pleased however they were able to get her in so clinically that is awesome and I do appreciate that. 11-24-2022 upon evaluation patient's wound is showing signs of improvement this does seem to be  improving as far as the overall appearance of the wound is concerned. I feel like that it is slowly clearing up to a good surface underneath were not quite there yet but at the same time I think Xeroform is doing a good job. Objective Belinda Day, Belinda Day A ([id number]) [id number] Page 5 of 6 Constitutional Well-nourished and well-hydrated in no acute distress. Vitals Time Taken: 10:02 AM, Height: 61 in, Weight: 113 lbs, BMI: 21.3, Temperature: 98.3 F, Pulse:  73 bpm, Respiratory Rate: 20 breaths/min, Blood Pressure: 144/76 mmHg. Respiratory normal breathing without difficulty. Psychiatric this patient is able to make decisions and demonstrates good insight into disease process. Alert and Oriented x 3. pleasant and cooperative. General Notes: Fortunately I do not see Any evidence of worsening overall and I do believe that the patient is making headway towards closure. Upon inspection patient's wound bed actually showed signs of good granulation epithelization at this point. she is doing well following the Mohs surgery. Integumentary (Hair, Skin) Wound #1 status is Open. Original cause of wound was Skin T ear/Laceration. The date acquired was: 08/28/2022. The wound has been in treatment 4 weeks. The wound is located on the Left,Anterior Lower Leg. The wound measures 1.8cm length x 2.4cm width x 0.2cm depth; 3.393cm^2 area and 0.679cm^3 volume. There is Fat Layer (Subcutaneous Tissue) exposed. There is no tunneling or undermining noted. There is a small amount of serosanguineous drainage noted. The wound margin is distinct with the outline attached to the wound base. There is large (67-100%) red, pink granulation within the wound bed. There is no necrotic tissue within the wound bed. The periwound skin appearance did not exhibit: Callus, Crepitus, Excoriation, Induration, Rash, Scarring, Dry/Scaly, Maceration, Atrophie Blanche, Cyanosis, Ecchymosis, Hemosiderin Staining, Mottled, Pallor, Rubor, Erythema. Assessment Active Problems ICD-10 Laceration without foreign body, left lower leg, initial encounter Cellulitis of left lower limb Type 2 diabetes mellitus with other skin ulcer Non-pressure chronic ulcer of other part of left lower leg with fat layer exposed Essential (primary) hypertension Other specified chronic obstructive pulmonary disease Plan Follow-up Appointments: Return Appointment in 1 week. Allen Derry PA Wednesday room 7 12/01/2022  1030 Return Appointment in 2 weeks. Allen Derry PA Wednesday room 7 (front office to schedule) Other: - may pick up at [facility] at [facility] for xeroform. Anesthetic: (In clinic) Topical Lidocaine 5% applied to wound bed Bathing/ Shower/ Hygiene: May shower and wash wound with soap and water. - Keep bandage on when in shower, at the end of shower remove dressing and wash with dial Antibacterial soap. After shower, gentle pat dry and apply Xeroform and form border dressing Additional Orders / Instructions: Other: - Punch Biopsy sent to [facility] labs. (10/27/22) WOUND #1: - Lower Leg Wound Laterality: Left, Anterior Cleanser: Soap and Water 1 x Per Day/30 Days Discharge Instructions: May shower and wash wound with dial antibacterial soap and water prior to dressing change. Cleanser: Vashe 5.8 (oz) 1 x Per Day/30 Days Discharge Instructions: Cleanse the wound with Vashe prior to applying a clean dressing using gauze sponges, not tissue or cotton balls. Prim Dressing: Xeroform Occlusive Gauze Dressing, 4x4 in (Generic) 1 x Per Day/30 Days ary Discharge Instructions: Apply to wound bed as instructed Secondary Dressing: Zetuvit Plus Silicone Border Dressing 4x4 (in/in) (Generic) 1 x Per Day/30 Days Discharge Instructions: Apply silicone border over primary dressing as directed. 1. Based on what I am seeing I do believe that the patient is making good headway towards closure this is good news.  2. I am also going to recommend that we continue with the Xeroform gauze. 3. We will continue with the Zetuvit to cover. We will see patient back for reevaluation in 1 week here in the clinic. If anything worsens or changes patient will contact our office for additional recommendations. Electronic Signature(s) Signed: 11/24/2022 10:51:44 AM By: Allen Derry PA-C Entered By: Allen Derry on 11/24/2022 07:51:43 Brayton Layman A ([id number]) [id number] Page 6 of  6 -------------------------------------------------------------------------------- SuperBill Details Patient Name: Date of Service: EMMERSYN, Belinda Day 11/24/2022 Medical Record Number: [id number] Patient Account Number: [id number] Date of Birth/Sex: Treating RN: Nov 13, 1945 (77 y.o. Debara Pickett, Millard.Loa Primary Care Provider: Tana Conch Other Clinician: Referring Provider: Treating Provider/Extender: Brunetta Jeans in Treatment: 4 Diagnosis Coding ICD-10 Codes Code Description [phone] Laceration without foreign body, left lower leg, initial encounter L03.116 Cellulitis of left lower limb E11.622 Type 2 diabetes mellitus with other skin ulcer L97.822 Non-pressure chronic ulcer of other part of left lower leg with fat layer exposed I10 Essential (primary) hypertension J44.89 Other specified chronic obstructive pulmonary disease Facility Procedures : CPT4 Code: [id number] Description: 99213 - WOUND CARE VISIT-LEV 3 EST PT Modifier: Quantity: 1 Physician Procedures : CPT4 Code Description Modifier [id number] - WC PHYS LEVEL 3 - EST PT ICD-10 Diagnosis Description S81.812A Laceration without foreign body, left lower leg, initial encounter L03.116 Cellulitis of left lower limb E11.622 Type 2 diabetes mellitus with  other skin ulcer L97.822 Non-pressure chronic ulcer of other part of left lower leg with fat layer exposed Quantity: 1 Electronic Signature(s) Signed: 11/24/2022 10:51:56 AM By: Allen Derry PA-C Entered By: Allen Derry on 11/24/2022 07:51:56

## 2022-11-24 NOTE — Progress Notes (Signed)
Belinda Day ([id number]) [id number] Page 1 of 7 Visit Report for 11/17/2022 Arrival Information Details Patient Name: Date of Service: Belinda Day, Belinda Day. 11/17/2022 11:15 [name] Medical Record Number: [id number] Patient Account Number: [id number] Date of Birth/Sex: Treating RN: [birth year] (77 y.o. Belinda Day Primary Care Kritika Stukes: Tana Conch Other Clinician: Referring Marqui Formby: Treating Oiva Dibari/Extender: Brunetta Jeans in Treatment: 3 Visit Information History Since Last Visit Added or deleted any medications: No Patient Arrived: Ambulatory Any new allergies or adverse reactions: No Arrival Time: 10:58 Had Day fall or experienced change in No Accompanied By: self activities of daily living that may affect Transfer Assistance: None risk of falls: Patient Identification Verified: Yes Signs or symptoms of abuse/neglect since last visito No Secondary Verification Process Completed: Yes Hospitalized since last visit: No Patient Has Alerts: Yes Implantable device outside of the clinic excluding No Patient Alerts: Patient on Blood Thinner cellular tissue based products placed in the center aspirin 81mg  since last visit: No ABI- Very painful. Has Dressing in Place as Prescribed: Yes Pain Present Now: Yes Electronic Signature(s) Signed: 11/18/2022 5:52:17 PM By: Redmond Pulling RN, BSN Entered By: Redmond Pulling on 11/17/2022 07:58:53 -------------------------------------------------------------------------------- Clinic Level of Care Assessment Details Patient Name: Date of Service: Belinda Day. 11/17/2022 11:15 [name] Medical Record Number: [id number] Patient Account Number: [id number] Date of Birth/Sex: Treating RN: 08/01/1945 (77 y.o. [name] Primary Care Sherilynn Dieu: Tana Conch Other Clinician: Referring Tristine Langi: Treating Xaiver Roskelley/Extender: Brunetta Jeans in Treatment:  3 Clinic Level of Care Assessment Items TOOL 4 Quantity Score X- 1 0 Use when only an EandM is performed on FOLLOW-UP visit ASSESSMENTS - Nursing Assessment / Reassessment X- 1 10 Reassessment of Co-morbidities (includes updates in patient status) X- 1 5 Reassessment of Adherence to Treatment Plan ASSESSMENTS - Wound and Skin Day ssessment / Reassessment X - Simple Wound Assessment / Reassessment - one wound 1 5 []  - 0 Complex Wound Assessment / Reassessment - multiple wounds []  - 0 Dermatologic / Skin Assessment (not related to wound area) ASSESSMENTS - Focused Assessment X- 1 5 Circumferential Edema Measurements - multi extremities []  - 0 Nutritional Assessment / Counseling / Intervention Belinda Day ([id number]) [phone].pdf Page 2 of 7 []  - 0 Lower Extremity Assessment (monofilament, tuning fork, pulses) []  - 0 Peripheral Arterial Disease Assessment (using hand held doppler) ASSESSMENTS - Ostomy and/or Continence Assessment and Care []  - 0 Incontinence Assessment and Management []  - 0 Ostomy Care Assessment and Management (repouching, etc.) PROCESS - Coordination of Care X - Simple Patient / Family Education for ongoing care 1 15 []  - 0 Complex (extensive) Patient / Family Education for ongoing care X- 1 10 Staff obtains Chiropractor, Records, T Results / Process Orders est X- 1 10 Staff telephones HHA, Nursing Homes / Clarify orders / etc []  - 0 Routine Transfer to another Facility (non-emergent condition) []  - 0 Routine Hospital Admission (non-emergent condition) []  - 0 New Admissions / Manufacturing engineer / Ordering NPWT Apligraf, etc. , []  - 0 Emergency Hospital Admission (emergent condition) []  - 0 Simple Discharge Coordination []  - 0 Complex (extensive) Discharge Coordination PROCESS - Special Needs []  - 0 Pediatric / Minor Patient Management []  - 0 Isolation Patient Management []  - 0 Hearing / Language / Visual special  needs []  - 0 Assessment of Community assistance (transportation, D/C planning, etc.) []  - 0 Additional assistance / Altered mentation []  - 0 Support Surface(s) Assessment (bed, cushion,  seat, etc.) INTERVENTIONS - Wound Cleansing / Measurement X - Simple Wound Cleansing - one wound 1 5 []  - 0 Complex Wound Cleansing - multiple wounds []  - 0 Wound Imaging (photographs - any number of wounds) []  - 0 Wound Tracing (instead of photographs) X- 1 5 Simple Wound Measurement - one wound []  - 0 Complex Wound Measurement - multiple wounds INTERVENTIONS - Wound Dressings X - Small Wound Dressing one or multiple wounds 1 10 []  - 0 Medium Wound Dressing one or multiple wounds []  - 0 Large Wound Dressing one or multiple wounds []  - 0 Application of Medications - topical []  - 0 Application of Medications - injection INTERVENTIONS - Miscellaneous []  - 0 External ear exam []  - 0 Specimen Collection (cultures, biopsies, blood, body fluids, etc.) []  - 0 Specimen(s) / Culture(s) sent or taken to Lab for analysis []  - 0 Patient Transfer (multiple staff / Nurse, adult / Similar devices) []  - 0 Simple Staple / Suture removal (25 or less) []  - 0 Complex Staple / Suture removal (26 or more) []  - 0 Hypo / Hyperglycemic Management (close monitor of Blood Glucose) Belinda Day, Belinda Day ([id number]) [id number].pdf Page 3 of 7 []  - 0 Ankle / Brachial Index (ABI) - do not check if billed separately X- 1 5 Vital Signs Has the patient been seen at the hospital within the last three years: Yes Total Score: 85 Level Of Care: New/Established - Level 3 Electronic Signature(s) Signed: 11/24/2022 3:10:04 PM By: Tommie Ard RN Entered By: Tommie Ard on 11/17/2022 08:23:45 -------------------------------------------------------------------------------- Lower Extremity Assessment Details Patient Name: Date of Service: Belinda Day. 11/17/2022 11:15 [name] Medical Record  Number: [id number] Patient Account Number: [id number] Date of Birth/Sex: Treating RN: May 02, 1945 (77 y.o. Belinda Day Primary Care Ralf Konopka: Tana Conch Other Clinician: Referring Giovanie Lefebre: Treating Deberah Adolf/Extender: Brunetta Jeans in Treatment: 3 Edema Assessment Assessed: [Left: No] [Right: No] [Left: Edema] [Right: :] Calf Left: Right: Point of Measurement: From Medial Instep 33 cm Ankle Left: Right: Point of Measurement: From Medial Instep 19.5 cm Vascular Assessment Pulses: Dorsalis Pedis Palpable: [Left:Yes] Extremity colors, hair growth, and conditions: Extremity Color: [Left:Normal] Hair Growth on Extremity: [Left:Yes] Temperature of Extremity: [Left:Warm] Capillary Refill: [Left:< 3 seconds] Dependent Rubor: [Left:No No] Electronic Signature(s) Signed: 11/18/2022 5:52:17 PM By: Redmond Pulling RN, BSN Entered By: Redmond Pulling on 11/17/2022 08:03:54 -------------------------------------------------------------------------------- Multi-Disciplinary Care Plan Details Patient Name: Date of Service: Belinda Day. 11/17/2022 11:15 [name] Medical Record Number: [id number] Patient Account Number: [id number] Date of Birth/Sex: Treating RN: 28-Aug-1945 (77 y.o. Roselee Nova, Jamie Primary Care Jaeda Bruso: Tana Conch Other Clinician: Referring Dayani Winbush: Treating Tracie Dore/Extender: Brunetta Jeans in Treatment: 3 Belinda Day, Belinda Day ([id number]) [id number] Page 4 of 7 Active Inactive Wound/Skin Impairment Nursing Diagnoses: Impaired tissue integrity Goals: Patient/caregiver will verbalize understanding of skin care regimen Date Initiated: 10/27/2022 Target Resolution Date: 01/25/2023 Goal Status: Active Interventions: Assess ulceration(s) every visit Treatment Activities: Skin care regimen initiated : 10/27/2022 Notes: Electronic Signature(s) Signed: 11/24/2022 3:10:04 PM By: Tommie Ard RN Entered By: Tommie Ard on 11/17/2022 08:25:14 -------------------------------------------------------------------------------- Pain Assessment Details Patient Name: Date of Service: Belinda Day. 11/17/2022 11:15 [name] Medical Record Number: [id number] Patient Account Number: [id number] Date of Birth/Sex: Treating RN: 1945/10/21 (77 y.o. Belinda Day Primary Care Haygen Zebrowski: Tana Conch Other Clinician: Referring Constant [place]: Treating Tagen Milby/Extender: Brunetta Jeans in Treatment: 3 Active Problems Location of Pain Severity and  Description of Pain Patient Has Paino Yes Site Locations Rate the pain. Current Pain Level: 4 Pain Management and Medication Current Pain Management: Electronic Signature(s) Signed: 11/18/2022 5:52:17 PM By: Redmond Pulling RN, BSN Entered By: Redmond Pulling on 11/17/2022 08:00:28 Belinda Day ([id number]) [phone].pdf Page 5 of 7 -------------------------------------------------------------------------------- Patient/Caregiver Education Details Patient Name: Date of Service: Belinda Day, Belinda Day 10/23/2024andnbsp11:15 [name] Medical Record Number: [id number] Patient Account Number: [id number] Date of Birth/Gender: Treating RN: [birth year] (77 y.o. [name] Primary Care Physician: Tana Conch Other Clinician: Referring Physician: Treating Physician/Extender: Brunetta Jeans in Treatment: 3 Education Assessment Education Provided To: Patient Education Topics Provided Wound Debridement: Methods: Explain/Verbal Responses: Reinforcements needed, State content correctly Wound/Skin Impairment: Methods: Explain/Verbal Responses: Reinforcements needed, State content correctly Electronic Signature(s) Signed: 11/24/2022 3:10:04 PM By: Tommie Ard RN Entered By: Tommie Ard on 11/17/2022  08:22:58 -------------------------------------------------------------------------------- Wound Assessment Details Patient Name: Date of Service: Belinda Day. 11/17/2022 11:15 [name] Medical Record Number: [id number] Patient Account Number: [id number] Date of Birth/Sex: Treating RN: Nov 18, 1945 (77 y.o. Belinda Day Primary Care Saydie Gerdts: Tana Conch Other Clinician: Referring Latamara Melder: Treating Jewelianna Pancoast/Extender: Brunetta Jeans in Treatment: 3 Wound Status Wound Number: 1 Primary Cellulitis Etiology: Wound Location: Left, Anterior Lower Leg Secondary Diabetic Wound/Ulcer of the Lower Extremity Wounding Event: Skin Tear/Laceration Etiology: Date Acquired: 08/28/2022 Wound Open Weeks Of Treatment: 3 Status: Clustered Wound: No Comorbid Chronic Obstructive Pulmonary Disease (COPD), Coronary History: Artery Disease, Hypertension, Type II Diabetes Photos Belinda Day, Belinda Day ([id number]) [phone].pdf Page 6 of 7 Wound Measurements Length: (cm) 2 Width: (cm) 2 Depth: (cm) 0.3 Area: (cm) 3.142 Volume: (cm) 0.942 % Reduction in Area: -300.3% % Reduction in Volume: -1092.4% Epithelialization: None Tunneling: No Undermining: No Wound Description Classification: Full Thickness Without Exposed Support Exudate Amount: Small Exudate Type: Serous Exudate Color: amber Structures Foul Odor After Cleansing: No Slough/Fibrino Yes Wound Bed Granulation Amount: Small (1-33%) Exposed Structure Necrotic Amount: Large (67-100%) Fascia Exposed: No Necrotic Quality: Eschar, Adherent Slough Fat Layer (Subcutaneous Tissue) Exposed: Yes Tendon Exposed: No Muscle Exposed: No Joint Exposed: No Bone Exposed: No Periwound Skin Texture Texture Color No Abnormalities Noted: No No Abnormalities Noted: No Scarring: Yes Moisture No Abnormalities Noted: No Electronic Signature(s) Signed: 11/18/2022 5:52:17 PM By: Redmond Pulling RN,  BSN Entered By: Redmond Pulling on 11/17/2022 08:08:34 -------------------------------------------------------------------------------- Vitals Details Patient Name: Date of Service: Belinda Day. 11/17/2022 11:15 [name] Medical Record Number: [id number] Patient Account Number: [id number] Date of Birth/Sex: Treating RN: 02-24-1945 (77 y.o. Belinda Day Primary Care Marnita Poirier: Tana Conch Other Clinician: Referring Alby Schwabe: Treating Novali Vollman/Extender: Brunetta Jeans in Treatment: 3 Vital Signs Time Taken: 10:58 Temperature (F): 98.3 Height (in): 61 Pulse (bpm): 75 Weight (lbs): 113 Respiratory Rate (breaths/min): 18 Body Mass Index (BMI): 21.3 Blood Pressure (mmHg): 151/66 Reference Range: 80 - 120 mg / dl Electronic Signature(s) Belinda Day, Belinda Day ([id number]) [phone].pdf Page 7 of 7 Signed: 11/18/2022 5:52:17 PM By: Redmond Pulling RN, BSN Entered By: Redmond Pulling on 11/17/2022 08:00:15

## 2022-11-26 NOTE — Progress Notes (Signed)
TRIS, HOWELL A ([id number]) [id number] Page 1 of 7 Visit Report for 11/24/2022 Arrival Information Details Patient Name: Date of Service: Belinda Day, Belinda Day 11/24/2022 10:15 A M Medical Record Number: [id number] Patient Account Number: [id number] Date of Birth/Sex: Treating RN: 1945/06/03 (77 y.o. Belinda Day Primary Care Alexys Lobello: Tana Conch Other Clinician: Referring Tifini Reeder: Treating Kevonte Vanecek/Extender: Brunetta Jeans in Treatment: 4 Visit Information History Since Last Visit Added or deleted any medications: No Patient Arrived: Ambulatory Any new allergies or adverse reactions: No Arrival Time: 10:01 Had a fall or experienced change in No Accompanied By: self activities of daily living that may affect Transfer Assistance: None risk of falls: Patient Identification Verified: Yes Signs or symptoms of abuse/neglect since last visito No Secondary Verification Process Completed: Yes Hospitalized since last visit: No Patient Requires Transmission-Based Precautions: No Implantable device outside of the clinic excluding No Patient Has Alerts: Yes cellular tissue based products placed in the center Patient Alerts: Patient on Blood Thinner since last visit: aspirin 81mg  Has Dressing in Place as Prescribed: Yes No ABI- Very painful. Pain Present Now: No Electronic Signature(s) Signed: 11/25/2022 6:03:32 PM By: Shawn Stall RN, BSN Entered By: Shawn Stall on 11/24/2022 07:01:21 -------------------------------------------------------------------------------- Clinic Level of Care Assessment Details Patient Name: Date of Service: Belinda Pluck RO N A. 11/24/2022 10:15 A M Medical Record Number: [id number] Patient Account Number: [id number] Date of Birth/Sex: Treating RN: Mar 27, 1945 (77 y.o. Belinda Day Primary Care Shadee Montoya: Tana Conch Other Clinician: Referring Korey Prashad: Treating Solly Derasmo/Extender: Brunetta Jeans in Treatment: 4 Clinic Level of Care Assessment Items TOOL 4 Quantity Score X- 1 0 Use when only an EandM is performed on FOLLOW-UP visit ASSESSMENTS - Nursing Assessment / Reassessment X- 1 10 Reassessment of Co-morbidities (includes updates in patient status) X- 1 5 Reassessment of Adherence to Treatment Plan ASSESSMENTS - Wound and Skin A ssessment / Reassessment X - Simple Wound Assessment / Reassessment - one wound 1 5 []  - 0 Complex Wound Assessment / Reassessment - multiple wounds X- 1 10 Dermatologic / Skin Assessment (not related to wound area) ASSESSMENTS - Focused Assessment X- 1 5 Circumferential Edema Measurements - multi extremities []  - 0 Nutritional Assessment / Counseling / Intervention SHEVA, MCDOUGLE A ([id number]) [phone].pdf Page 2 of 7 []  - 0 Lower Extremity Assessment (monofilament, tuning fork, pulses) []  - 0 Peripheral Arterial Disease Assessment (using hand held doppler) ASSESSMENTS - Ostomy and/or Continence Assessment and Care []  - 0 Incontinence Assessment and Management []  - 0 Ostomy Care Assessment and Management (repouching, etc.) PROCESS - Coordination of Care X - Simple Patient / Family Education for ongoing care 1 15 []  - 0 Complex (extensive) Patient / Family Education for ongoing care X- 1 10 Staff obtains Chiropractor, Records, T Results / Process Orders est []  - 0 Staff telephones HHA, Nursing Homes / Clarify orders / etc []  - 0 Routine Transfer to another Facility (non-emergent condition) []  - 0 Routine Hospital Admission (non-emergent condition) []  - 0 New Admissions / Manufacturing engineer / Ordering NPWT Apligraf, etc. , []  - 0 Emergency Hospital Admission (emergent condition) X- 1 10 Simple Discharge Coordination []  - 0 Complex (extensive) Discharge Coordination PROCESS - Special Needs []  - 0 Pediatric / Minor Patient Management []  - 0 Isolation Patient  Management []  - 0 Hearing / Language / Visual special needs []  - 0 Assessment of Community assistance (transportation, D/C planning, etc.) []  - 0 Additional assistance / Altered mentation []  - 0  Support Surface(s) Assessment (bed, cushion, seat, etc.) INTERVENTIONS - Wound Cleansing / Measurement X - Simple Wound Cleansing - one wound 1 5 []  - 0 Complex Wound Cleansing - multiple wounds X- 1 5 Wound Imaging (photographs - any number of wounds) []  - 0 Wound Tracing (instead of photographs) X- 1 5 Simple Wound Measurement - one wound []  - 0 Complex Wound Measurement - multiple wounds INTERVENTIONS - Wound Dressings X - Small Wound Dressing one or multiple wounds 1 10 []  - 0 Medium Wound Dressing one or multiple wounds []  - 0 Large Wound Dressing one or multiple wounds []  - 0 Application of Medications - topical []  - 0 Application of Medications - injection INTERVENTIONS - Miscellaneous []  - 0 External ear exam []  - 0 Specimen Collection (cultures, biopsies, blood, body fluids, etc.) []  - 0 Specimen(s) / Culture(s) sent or taken to Lab for analysis []  - 0 Patient Transfer (multiple staff / Nurse, adult / Similar devices) []  - 0 Simple Staple / Suture removal (25 or less) []  - 0 Complex Staple / Suture removal (26 or more) []  - 0 Hypo / Hyperglycemic Management (close monitor of Blood Glucose) Vigen, Dua A ([id number]) [id number] Page 3 of 7 []  - 0 Ankle / Brachial Index (ABI) - do not check if billed separately X- 1 5 Vital Signs Has the patient been seen at the hospital within the last three years: Yes Total Score: 100 Level Of Care: New/Established - Level 3 Electronic Signature(s) Signed: 11/25/2022 6:03:32 PM By: Shawn Stall RN, BSN Entered By: Shawn Stall on 11/24/2022 07:40:47 -------------------------------------------------------------------------------- Encounter Discharge Information Details Patient Name: Date of  Service: Belinda Pluck RO N A. 11/24/2022 10:15 A M Medical Record Number: [id number] Patient Account Number: [id number] Date of Birth/Sex: Treating RN: 03-09-1945 (77 y.o. Belinda Day Primary Care Alexas Basulto: Tana Conch Other Clinician: Referring [name]: Treating Lelania Bia/Extender: Brunetta Jeans in Treatment: 4 Encounter Discharge Information Items Discharge Condition: Stable Ambulatory Status: Ambulatory Discharge Destination: Home Transportation: Private Auto Accompanied By: self Schedule Follow-up Appointment: Yes Clinical Summary of Care: Electronic Signature(s) Signed: 11/25/2022 6:03:32 PM By: Shawn Stall RN, BSN Entered By: Shawn Stall on 11/24/2022 07:41:17 -------------------------------------------------------------------------------- Lower Extremity Assessment Details Patient Name: Date of Service: Belinda Pluck RO N A. 11/24/2022 10:15 A M Medical Record Number: [id number] Patient Account Number: [id number] Date of Birth/Sex: Treating RN: Jun 28, 1945 (77 y.o. Belinda Day Primary Care Mallorie Norrod: Tana Conch Other Clinician: Referring Barri Neidlinger: Treating Antonin Meininger/Extender: Brunetta Jeans in Treatment: 4 Edema Assessment Assessed: [Left: Yes] [Right: No] Edema: [Left: N] [Right: o] Calf Left: Right: Point of Measurement: From Medial Instep 31 cm Ankle Left: Right: Point of Measurement: From Medial Instep 21 cm Vascular Assessment Podolski, Larayne A ([id number]) [Right:[id number] Page 4 of 7] Pulses: Dorsalis Pedis Palpable: [Left:Yes] Extremity colors, hair growth, and conditions: Extremity Color: [Left:Normal] Hair Growth on Extremity: [Left:Yes] Temperature of Extremity: [Left:Warm] Capillary Refill: [Left:< 3 seconds] Dependent Rubor: [Left:No] Blanched when Elevated: [Left:No No] Toe Nail Assessment Left: Right: Thick: No Discolored: No Deformed: No Improper  Length and Hygiene: No Electronic Signature(s) Signed: 11/25/2022 6:03:32 PM By: Shawn Stall RN, BSN Entered By: Shawn Stall on 11/24/2022 07:01:43 -------------------------------------------------------------------------------- Multi-Disciplinary Care Plan Details Patient Name: Date of Service: Belinda Pluck RO N A. 11/24/2022 10:15 A M Medical Record Number: [id number] Patient Account Number: [id number] Date of Birth/Sex: Treating RN: [birth year] (77 y.o. Belinda Day Primary Care Juleen Sorrels: Tana Conch Other Clinician: Referring Fabyan Loughmiller: Treating Padraig Nhan/Extender: Linwood Dibbles,  Patience Musca, Stephen Weeks in Treatment: 4 Active Inactive Wound/Skin Impairment Nursing Diagnoses: Impaired tissue integrity Goals: Patient/caregiver will verbalize understanding of skin care regimen Date Initiated: 10/27/2022 Target Resolution Date: 01/25/2023 Goal Status: Active Interventions: Assess ulceration(s) every visit Treatment Activities: Skin care regimen initiated : 10/27/2022 Notes: Electronic Signature(s) Signed: 11/25/2022 6:03:32 PM By: Shawn Stall RN, BSN Entered By: Shawn Stall on 11/24/2022 07:15:18 Brayton Layman A ([id number]) [id number] Page 5 of 7 -------------------------------------------------------------------------------- Pain Assessment Details Patient Name: Date of Service: BRAYLEY, MACKOWIAK 11/24/2022 10:15 A M Medical Record Number: [id number] Patient Account Number: [id number] Date of Birth/Sex: Treating RN: 1945-06-30 (77 y.o. Belinda Day Primary Care Auden Wettstein: Tana Conch Other Clinician: Referring Bladyn Tipps: Treating Lurie Mullane/Extender: Brunetta Jeans in Treatment: 4 Active Problems Location of Pain Severity and Description of Pain Patient Has Paino No Site Locations Pain Management and Medication Current Pain Management: Electronic Signature(s) Signed: 11/25/2022 6:03:32 PM By: Shawn Stall RN, BSN Entered By: Shawn Stall on 11/24/2022 07:01:29 -------------------------------------------------------------------------------- Patient/Caregiver Education Details Patient Name: Date of Service: Jorge Ny 10/30/2024andnbsp10:15 A M Medical Record Number: [id number] Patient Account Number: [id number] Date of Birth/Gender: Treating RN: 1945/03/22 (77 y.o. Belinda Day Primary Care Physician: Tana Conch Other Clinician: Referring Physician: Treating Physician/Extender: Brunetta Jeans in Treatment: 4 Education Assessment Education Provided To: Patient Education Topics Provided Wound/Skin Impairment: Handouts: Caring for Your Ulcer Methods: Explain/Verbal Responses: Reinforcements needed Electronic Signature(s) Signed: 11/25/2022 6:03:32 PM By: Shawn Stall RN, BSN Brayton Layman A ([id number]) [id number] Page 6 of 7 Entered By: Shawn Stall on 11/24/2022 07:15:28 -------------------------------------------------------------------------------- Wound Assessment Details Patient Name: Date of Service: ZENIAH, BRINEY 11/24/2022 10:15 A M Medical Record Number: [id number] Patient Account Number: [id number] Date of Birth/Sex: Treating RN: 08-26-1945 (77 y.o. Debara Pickett, Millard.Loa Primary Care Caylor Tallarico: Tana Conch Other Clinician: Referring Messi Twedt: Treating Anayansi Rundquist/Extender: Brunetta Jeans in Treatment: 4 Wound Status Wound Number: 1 Primary Cellulitis Etiology: Wound Location: Left, Anterior Lower Leg Secondary Diabetic Wound/Ulcer of the Lower Extremity Wounding Event: Skin Tear/Laceration Etiology: Date Acquired: 08/28/2022 Wound Open Weeks Of Treatment: 4 Status: Clustered Wound: No Comorbid Chronic Obstructive Pulmonary Disease (COPD), Coronary History: Artery Disease, Hypertension, Type II Diabetes Photos Wound Measurements Length: (cm) 1.8 Width: (cm)  2.4 Depth: (cm) 0.2 Area: (cm) 3.393 Volume: (cm) 0.679 % Reduction in Area: -332.2% % Reduction in Volume: -759.5% Epithelialization: None Tunneling: No Undermining: No Wound Description Classification: Full Thickness Without Exposed Suppor Wound Margin: Distinct, outline attached Exudate Amount: Small Exudate Type: Serosanguineous Exudate Color: red, brown t Structures Foul Odor After Cleansing: No Slough/Fibrino Yes Wound Bed Granulation Amount: Large (67-100%) Exposed Structure Granulation Quality: Red, Pink Fascia Exposed: No Necrotic Amount: None Present (0%) Fat Layer (Subcutaneous Tissue) Exposed: Yes Tendon Exposed: No Muscle Exposed: No Joint Exposed: No Bone Exposed: No Periwound Skin Texture Texture Color No Abnormalities Noted: No No Abnormalities Noted: No Callus: No Atrophie Blanche: No Crepitus: No Cyanosis: No Excoriation: No Ecchymosis: No Induration: No Erythema: No Rash: No Hemosiderin Staining: No Scarring: No Mottled: No Pallor: No Chimento, Laconya A ([id number]) [id number] Page 7 of 7 Pallor: No Moisture Rubor: No No Abnormalities Noted: No Dry / Scaly: No Maceration: No Treatment Notes Wound #1 (Lower Leg) Wound Laterality: Left, Anterior Cleanser Soap and Water Discharge Instruction: May shower and wash wound with dial antibacterial soap and water prior to dressing change. Vashe 5.8 (oz) Discharge Instruction: Cleanse the wound with Vashe  prior to applying a clean dressing using gauze sponges, not tissue or cotton balls. Peri-Wound Care Topical Primary Dressing Xeroform Occlusive Gauze Dressing, 4x4 in Discharge Instruction: Apply to wound bed as instructed Secondary Dressing Zetuvit Plus Silicone Border Dressing 4x4 (in/in) Discharge Instruction: Apply silicone border over primary dressing as directed. Secured With Compression Wrap Compression Stockings Facilities manager) Signed:  11/25/2022 6:03:32 PM By: Shawn Stall RN, BSN Entered By: Shawn Stall on 11/24/2022 07:05:43 -------------------------------------------------------------------------------- Vitals Details Patient Name: Date of Service: Belinda Pluck RO N A. 11/24/2022 10:15 A M Medical Record Number: [id number] Patient Account Number: [id number] Date of Birth/Sex: Treating RN: 27-May-1945 (77 y.o. Debara Pickett, Millard.Loa Primary Care Tamina Cyphers: Tana Conch Other Clinician: Referring Jerzie Bieri: Treating Sayan Aldava/Extender: Brunetta Jeans in Treatment: 4 Vital Signs Time Taken: 10:02 Temperature (F): 98.3 Height (in): 61 Pulse (bpm): 73 Weight (lbs): 113 Respiratory Rate (breaths/min): 20 Body Mass Index (BMI): 21.3 Blood Pressure (mmHg): 144/76 Reference Range: 80 - 120 mg / dl Electronic Signature(s) Signed: 11/25/2022 6:03:32 PM By: Shawn Stall RN, BSN Entered By: Shawn Stall on 11/24/2022 07:02:52

## 2022-12-01 ENCOUNTER — Encounter (HOSPITAL_BASED_OUTPATIENT_CLINIC_OR_DEPARTMENT_OTHER): Payer: Medicare Other | Attending: Physician Assistant | Admitting: Internal Medicine

## 2022-12-01 DIAGNOSIS — L97822 Non-pressure chronic ulcer of other part of left lower leg with fat layer exposed: Secondary | ICD-10-CM | POA: Insufficient documentation

## 2022-12-01 DIAGNOSIS — W11XXXA Fall on and from ladder, initial encounter: Secondary | ICD-10-CM | POA: Diagnosis not present

## 2022-12-01 DIAGNOSIS — S81812A Laceration without foreign body, left lower leg, initial encounter: Secondary | ICD-10-CM | POA: Diagnosis not present

## 2022-12-01 DIAGNOSIS — I10 Essential (primary) hypertension: Secondary | ICD-10-CM | POA: Insufficient documentation

## 2022-12-01 DIAGNOSIS — J4489 Other specified chronic obstructive pulmonary disease: Secondary | ICD-10-CM | POA: Diagnosis not present

## 2022-12-01 DIAGNOSIS — E11622 Type 2 diabetes mellitus with other skin ulcer: Secondary | ICD-10-CM | POA: Insufficient documentation

## 2022-12-01 DIAGNOSIS — L03116 Cellulitis of left lower limb: Secondary | ICD-10-CM | POA: Diagnosis not present

## 2022-12-03 NOTE — Progress Notes (Signed)
BEAUTY, RIGHI Day ([id number]) [id number] Page 1 of 8 Visit Report for 12/01/2022 Arrival Information Details Patient Name: Date of Service: Belinda Day, Belinda Day. 12/01/2022 10:30 [name] Medical Record Number: [id number] Patient Account Number: [id number] Date of Birth/Sex: Treating RN: 17-Oct-1945 (77 y.o. Belinda Day Primary Care Belinda Day: Belinda Day Other Clinician: Referring Belinda Day: Treating Belinda Day/Extender: Belinda Day in Treatment: 5 Visit Information History Since Last Visit Added or deleted any medications: No Patient Arrived: Ambulatory Any new allergies or adverse reactions: No Arrival Time: 10:34 Had Day fall or experienced change in No Accompanied By: self activities of daily living that may affect Transfer Assistance: None risk of falls: Patient Identification Verified: Yes Signs or symptoms of abuse/neglect since last visito No Patient Requires Transmission-Based Precautions: No Hospitalized since last visit: No Patient Has Alerts: Yes Implantable device outside of the clinic excluding No Patient Alerts: Patient on Blood Thinner cellular tissue based products placed in the center aspirin 81mg  since last visit: No ABI- Very painful. Has Dressing in Place as Prescribed: Yes Pain Present Now: Yes Electronic Signature(s) Signed: 12/02/2022 6:28:11 PM By: Karie Schwalbe RN Entered By: Karie Schwalbe on 12/01/2022 10:34:30 -------------------------------------------------------------------------------- Clinic Level of Care Assessment Details Patient Name: Date of Service: Belinda Day. 12/01/2022 10:30 [name] Medical Record Number: [id number] Patient Account Number: [id number] Date of Birth/Sex: Treating RN: [birth year] (77 y.o. Belinda Day Primary Care Zillah Alexie: Belinda Day Other Clinician: Referring Man Bonneau: Treating Ciella Obi/Extender: Belinda Day in Treatment:  5 Clinic Level of Care Assessment Items TOOL 4 Quantity Score X- 1 0 Use when only an EandM is performed on FOLLOW-UP visit ASSESSMENTS - Nursing Assessment / Reassessment X- 1 10 Reassessment of Co-morbidities (includes updates in patient status) X- 1 5 Reassessment of Adherence to Treatment Plan ASSESSMENTS - Wound and Skin Day ssessment / Reassessment X - Simple Wound Assessment / Reassessment - one wound 1 5 []  - 0 Complex Wound Assessment / Reassessment - multiple wounds []  - 0 Dermatologic / Skin Assessment (not related to wound area) ASSESSMENTS - Focused Assessment []  - 0 Circumferential Edema Measurements - multi extremities []  - 0 Nutritional Assessment / Counseling / Intervention Belinda Day, Belinda Day ([id number]) [id number] Page 2 of 8 []  - 0 Lower Extremity Assessment (monofilament, tuning fork, pulses) []  - 0 Peripheral Arterial Disease Assessment (using hand held doppler) ASSESSMENTS - Ostomy and/or Continence Assessment and Care []  - 0 Incontinence Assessment and Management []  - 0 Ostomy Care Assessment and Management (repouching, etc.) PROCESS - Coordination of Care X - Simple Patient / Family Education for ongoing care 1 15 []  - 0 Complex (extensive) Patient / Family Education for ongoing care X- 1 10 Staff obtains Chiropractor, Records, T Results / Process Orders est X- 1 10 Staff telephones HHA, Nursing Homes / Clarify orders / etc []  - 0 Routine Transfer to another Facility (non-emergent condition) []  - 0 Routine Hospital Admission (non-emergent condition) []  - 0 New Admissions / Manufacturing engineer / Ordering NPWT Apligraf, etc. , []  - 0 Emergency Hospital Admission (emergent condition) X- 1 10 Simple Discharge Coordination []  - 0 Complex (extensive) Discharge Coordination PROCESS - Special Needs []  - 0 Pediatric / Minor Patient Management []  - 0 Isolation Patient Management []  - 0 Hearing / Language / Visual special  needs []  - 0 Assessment of Community assistance (transportation, D/C planning, etc.) []  - 0 Additional assistance / Altered mentation []  - 0 Support Surface(s) Assessment (bed, cushion, seat, etc.) INTERVENTIONS -  Wound Cleansing / Measurement X - Simple Wound Cleansing - one wound 1 5 []  - 0 Complex Wound Cleansing - multiple wounds X- 1 5 Wound Imaging (photographs - any number of wounds) []  - 0 Wound Tracing (instead of photographs) X- 1 5 Simple Wound Measurement - one wound []  - 0 Complex Wound Measurement - multiple wounds INTERVENTIONS - Wound Dressings X - Small Wound Dressing one or multiple wounds 1 10 []  - 0 Medium Wound Dressing one or multiple wounds []  - 0 Large Wound Dressing one or multiple wounds []  - 0 Application of Medications - topical []  - 0 Application of Medications - injection INTERVENTIONS - Miscellaneous []  - 0 External ear exam []  - 0 Specimen Collection (cultures, biopsies, blood, body fluids, etc.) []  - 0 Specimen(s) / Culture(s) sent or taken to Lab for analysis []  - 0 Patient Transfer (multiple staff / Nurse, adult / Similar devices) []  - 0 Simple Staple / Suture removal (25 or less) []  - 0 Complex Staple / Suture removal (26 or more) []  - 0 Hypo / Hyperglycemic Management (close monitor of Blood Glucose) Belinda Day, Belinda Day ([id number]) [id number] Page 3 of 8 []  - 0 Ankle / Brachial Index (ABI) - do not check if billed separately X- 1 5 Vital Signs Has the patient been seen at the hospital within the last three years: Yes Total Score: 95 Level Of Care: New/Established - Level 3 Electronic Signature(s) Signed: 12/02/2022 6:28:11 PM By: Karie Schwalbe RN Entered By: Karie Schwalbe on 12/01/2022 11:35:26 -------------------------------------------------------------------------------- Lower Extremity Assessment Details Patient Name: Date of Service: Belinda Day. 12/01/2022 10:30 [name] Medical Record  Number: [id number] Patient Account Number: [id number] Date of Birth/Sex: Treating RN: 03-16-1945 (77 y.o. Belinda Day Primary Care Kasey Ewings: Belinda Day Other Clinician: Referring Nirvan Laban: Treating Garald Rhew/Extender: Belinda Day in Treatment: 5 Edema Assessment Assessed: Kyra Searles: No] [Right: No] Edema: [Left: N] [Right: o] Calf Left: Right: Point of Measurement: From Medial Instep 33 cm Ankle Left: Right: Point of Measurement: From Medial Instep 22 cm Vascular Assessment Pulses: Dorsalis Pedis Palpable: [Left:Yes] Extremity colors, hair growth, and conditions: Extremity Color: [Left:Normal] Hair Growth on Extremity: [Left:Yes] Temperature of Extremity: [Left:Warm] Capillary Refill: [Left:< 3 seconds] Dependent Rubor: [Left:No No] Electronic Signature(s) Signed: 12/02/2022 6:28:11 PM By: Karie Schwalbe RN Entered By: Karie Schwalbe on 12/01/2022 10:35:55 -------------------------------------------------------------------------------- Multi Wound Chart Details Patient Name: Date of Service: Belinda Day. 12/01/2022 10:30 [name] Medical Record Number: [id number] Patient Account Number: [id number] Date of Birth/Sex: Treating RN: 11/27/1945 (77 y.o. F) Primary Care Carolyn Sylvia: Belinda Day Other Clinician: Referring Jahn Franchini: Treating Saron Vanorman/Extender: Belinda Day in Treatment: 5 Belinda Day, Belinda [place] Day ([id number]) [id number] Page 4 of 8 Vital Signs Height(in): 61 Pulse(bpm): 80 Weight(lbs): 113 Blood Pressure(mmHg): 117/69 Body Mass Index(BMI): 21.3 Temperature(F): 98.3 Respiratory Rate(breaths/min): 16 [1:Photos:] [N/Day:N/Day] Left, Anterior Lower Leg N/Day N/Day Wound Location: Skin T ear/Laceration N/Day N/Day Wounding Event: Cellulitis N/Day N/Day Primary Etiology: Diabetic Wound/Ulcer of the Lower N/Day N/Day Secondary Etiology: Extremity Chronic Obstructive Pulmonary N/Day N/Day Comorbid  History: Disease (COPD), Coronary Artery Disease, Hypertension, Type II Diabetes 08/28/2022 N/Day N/Day Date Acquired: 5 N/Day N/Day Weeks of Treatment: Open N/Day N/Day Wound Status: No N/Day N/Day Wound Recurrence: 1.8x2.3x0.2 N/Day N/Day Measurements L x W x D (cm) 3.252 N/Day N/Day Day (cm) : rea 0.65 N/Day N/Day Volume (cm) : -314.30% N/Day N/Day % Reduction in Area: -722.80% N/Day N/Day % Reduction in Volume: Full Thickness Without Exposed N/Day  N/Day Classification: Support Structures Small N/Day N/Day Exudate Amount: Serosanguineous N/Day N/Day Exudate Type: red, brown N/Day N/Day Exudate Color: Distinct, outline attached N/Day N/Day Wound Margin: Large (67-100%) N/Day N/Day Granulation Amount: Red, Hyper-granulation N/Day N/Day Granulation Quality: None Present (0%) N/Day N/Day Necrotic Amount: Fat Layer (Subcutaneous Tissue): Yes N/Day N/Day Exposed Structures: Fascia: No Tendon: No Muscle: No Joint: No Bone: No Small (1-33%) N/Day N/Day Epithelialization: Excoriation: No N/Day N/Day Periwound Skin Texture: Induration: No Callus: No Crepitus: No Rash: No Scarring: No Maceration: No N/Day N/Day Periwound Skin Moisture: Dry/Scaly: No Atrophie Blanche: No N/Day N/Day Periwound Skin Color: Cyanosis: No Ecchymosis: No Erythema: No Hemosiderin Staining: No Mottled: No Pallor: No Rubor: No Treatment Notes Electronic Signature(s) Signed: 12/01/2022 4:54:50 PM By: Baltazar Najjar MD Entered By: Baltazar Najjar on 12/01/2022 11:33:33 Brayton Layman Day ([id number]) [id number] Page 5 of 8 -------------------------------------------------------------------------------- Multi-Disciplinary Care Plan Details Patient Name: Date of Service: Belinda Day, Belinda Day. 12/01/2022 10:30 [name] Medical Record Number: [id number] Patient Account Number: [id number] Date of Birth/Sex: Treating RN: [birth year] (77 y.o. Arta Silence Primary Care Damarie Schoolfield: Belinda Day Other Clinician: Referring Konstantinos Cordoba: Treating  Kessler Kopinski/Extender: Belinda Day in Treatment: 5 Active Inactive Wound/Skin Impairment Nursing Diagnoses: Impaired tissue integrity Goals: Patient/caregiver will verbalize understanding of skin care regimen Date Initiated: 10/27/2022 Target Resolution Date: 01/25/2023 Goal Status: Active Interventions: Assess ulceration(s) every visit Treatment Activities: Skin care regimen initiated : 10/27/2022 Notes: Electronic Signature(s) Signed: 12/01/2022 6:03:26 PM By: Shawn Stall RN, BSN Entered By: Shawn Stall on 12/01/2022 10:44:47 -------------------------------------------------------------------------------- Pain Assessment Details Patient Name: Date of Service: Belinda Day. 12/01/2022 10:30 [name] Medical Record Number: [id number] Patient Account Number: [id number] Date of Birth/Sex: Treating RN: 28-Aug-1945 (77 y.o. Belinda Day Primary Care Ramel Tobon: Belinda Day Other Clinician: Referring Hakiem Malizia: Treating Raydon Chappuis/Extender: Belinda Day in Treatment: 5 Active Problems Location of Pain Severity and Description of Pain Patient Has Paino Yes Site Locations Pain Location: Belinda Day, Belinda Day ([id number]) [id number] Page 6 of 8 Pain Location: Generalized Pain With Dressing Change: No Duration of the Pain. Constant / Intermittento Constant Rate the pain. Current Pain Level: 5 Worst Pain Level: 10 Least Pain Level: 3 Tolerable Pain Level: 5 Character of Pain Describe the Pain: Difficult to Pinpoint Pain Management and Medication Current Pain Management: Medication: Yes Cold Application: No Rest: Yes Massage: No Activity: No T.E.N.S.: No Heat Application: No Leg drop or elevation: No Is the Current Pain Management Adequate: Adequate How does your wound impact your activities of daily livingo Sleep: No Bathing: No Appetite: No Relationship With Others: No Bladder Continence:  No Emotions: No Bowel Continence: No Work: No Toileting: No Drive: No Dressing: No Hobbies: No Electronic Signature(s) Signed: 12/02/2022 6:28:11 PM By: Karie Schwalbe RN Entered By: Karie Schwalbe on 12/01/2022 10:41:54 -------------------------------------------------------------------------------- Patient/Caregiver Education Details Patient Name: Date of Service: Belinda Day 11/6/2024andnbsp10:30 [name] Medical Record Number: [id number] Patient Account Number: [id number] Date of Birth/Gender: Treating RN: 1945/10/30 (77 y.o. Arta Silence Primary Care Physician: Belinda Day Other Clinician: Referring Physician: Treating Physician/Extender: Belinda Day in Treatment: 5 Education Assessment Education Provided To: Patient Education Topics Provided Wound/Skin Impairment: Handouts: Caring for Your Ulcer Methods: Explain/Verbal Responses: Reinforcements needed Electronic Signature(s) Signed: 12/01/2022 6:03:26 PM By: Shawn Stall RN, BSN Belinda Day ([id number]) PM By: Shawn Stall RN, BSN [phone].pdf Page 7 of 8 Signed: 12/01/2022 6:03:26 Entered By: Shawn Stall on 12/01/2022 10:44:58 -------------------------------------------------------------------------------- Wound Assessment Details Patient  Name: Date of Service: Belinda Day, Belinda Day. 12/01/2022 10:30 [name] Medical Record Number: [id number] Patient Account Number: [id number] Date of Birth/Sex: Treating RN: 20-Jan-1946 (77 y.o. Belinda Day Primary Care Fabrizzio Marcella: Belinda Day Other Clinician: Referring Braxden Lovering: Treating Callahan Peddie/Extender: Belinda Day in Treatment: 5 Wound Status Wound Number: 1 Primary Cellulitis Etiology: Wound Location: Left, Anterior Lower Leg Secondary Diabetic Wound/Ulcer of the Lower Extremity Wounding Event: Skin Tear/Laceration Etiology: Date Acquired: 08/28/2022 Wound Open Weeks Of  Treatment: 5 Status: Clustered Wound: No Comorbid Chronic Obstructive Pulmonary Disease (COPD), Coronary History: Artery Disease, Hypertension, Type II Diabetes Photos Wound Measurements Length: (cm) 1.8 Width: (cm) 2.3 Depth: (cm) 0.2 Area: (cm) 3.252 Volume: (cm) 0.65 % Reduction in Area: -314.3% % Reduction in Volume: -722.8% Epithelialization: Small (1-33%) Tunneling: No Undermining: No Wound Description Classification: Full Thickness Without Exposed Suppor Wound Margin: Distinct, outline attached Exudate Amount: Small Exudate Type: Serosanguineous Exudate Color: red, brown t Structures Foul Odor After Cleansing: No Slough/Fibrino Yes Wound Bed Granulation Amount: Large (67-100%) Exposed Structure Granulation Quality: Red, Hyper-granulation Fascia Exposed: No Necrotic Amount: None Present (0%) Fat Layer (Subcutaneous Tissue) Exposed: Yes Tendon Exposed: No Muscle Exposed: No Joint Exposed: No Bone Exposed: No Periwound Skin Texture Texture Color No Abnormalities Noted: No No Abnormalities Noted: No Callus: No Atrophie Blanche: No Crepitus: No Cyanosis: No Excoriation: No Ecchymosis: No Induration: No Erythema: No Rash: No Hemosiderin Staining: No Belinda Day, Belinda Day ([id number]) [id number] Page 8 of 8 Scarring: No Mottled: No Pallor: No Moisture Rubor: No No Abnormalities Noted: No Dry / Scaly: No Maceration: No Electronic Signature(s) Signed: 12/02/2022 6:28:11 PM By: Karie Schwalbe RN Entered By: Karie Schwalbe on 12/01/2022 10:43:13 -------------------------------------------------------------------------------- Vitals Details Patient Name: Date of Service: Belinda Day. 12/01/2022 10:30 [name] Medical Record Number: [id number] Patient Account Number: [id number] Date of Birth/Sex: Treating RN: 1945-10-03 (77 y.o. Belinda Day Primary Care Cartha Rotert: Belinda Day Other Clinician: Referring Taj Nevins: Treating  Misheel Gowans/Extender: Belinda Day in Treatment: 5 Vital Signs Time Taken: 10:33 Temperature (F): 98.3 Height (in): 61 Pulse (bpm): 80 Weight (lbs): 113 Respiratory Rate (breaths/min): 16 Body Mass Index (BMI): 21.3 Blood Pressure (mmHg): 117/69 Reference Range: 80 - 120 mg / dl Electronic Signature(s) Signed: 12/02/2022 6:28:11 PM By: Karie Schwalbe RN Entered By: Karie Schwalbe on 12/01/2022 10:41:39

## 2022-12-03 NOTE — Progress Notes (Signed)
LEVONNE, UTECH Day ([id number]) [id number] Page 1 of 6 Visit Report for 12/01/2022 HPI Details Patient Name: Date of Service: Belinda Day, Belinda Day. 12/01/2022 10:30 [name] Medical Record Number: [id number] Patient Account Number: [id number] Date of Birth/Sex: Treating RN: Belinda Day-[birth year] (77 y.o. F) Primary Care Provider: Tana Conch Other Clinician: Referring Provider: Treating Provider/Extender: Jaquelyn Bitter in Treatment: 5 History of Present Illness HPI Description: 10/27/2022 upon evaluation today patient presents for initial inspection here in the clinic regarding Day wound over the left anterior lower extremity. T back up Day little bit and gives some history I am going to recount what the patient brought in written out on her sheet with her today which is an o excellent history of the course of this wound. On August 3 the patient sustained an injury to the left leg due to Day fall off of Day ladder. This was essentially Day deep cut and continue to bleed for about 4 days. On August 7 she saw Dr. Lenn Cal at [facility] and was started on Keflex at that point. Subsequently on August 14 she went back to [facility] where she was placed on doxycycline 100 mg for 2 rounds. The next time that she was evaluated was actually about 3 weeks later when back at [facility] September 6 she was sent to the hospital due to what appeared to be worsening and started on vancomycin IV. September 7 she was actually discharged from the hospital with linezolid 600 mg. On September 18 she saw Dr. Anselm Jungling who recommended the patient go to [facility] long the next morning. On September Day she saw infectious disease and they ordered an MRI as well as putting the patient on Dalvance. This was by IV. The next appointment with infectious disease was September 26 at that point it was noted that the patient had Day number MRI and the results of that were  reported out on 9-Day-2024 which showed that she had soft tissue swelling of the distal lower leg with focal skin thickening and enhancement anteriorly consistent with cellulitis no abscess. There was also Day thin periosteal edema and enhancement along the anterior medial aspect of the distal tibia this is stated to be likely reactive no definitive osteomyelitis noted. Subsequently she states that she did follow-up requesting to go to the wound center for further evaluation and was referred to [place] today October 2 for evaluation in the meantime she has been using Silvadene cream. Her most recent hemoglobin A1c was 6.6 she does have diabetes but otherwise no major medical problems other than hypertension and COPD that would affect her ability to heal. 11-03-2022 upon evaluation today patient appears to be doing okay currently in regard to her wound. I did actually perform Day biopsy last week and came back positive for squamous cell carcinoma. With that being said I discussed with the patient that she is likely can require Day full excision of this area and I recommended that she may need to go to the skin surgery center unless we get her in with her dermatologist sooner. I am not sure if they have Day Mohs surgeon at [facility] dermatology or not. With that being said we will get Day see about get in touch with them and finding out which I think will definitely be of benefit if so because it could be something when she gets in much faster. 11-17-2022 upon evaluation today patient presents for follow-up I last saw her on October 9 since  then she actually have the mobile skin surgery on November 15, 2022 that she has been 2 days ago. This actually went very well the area that they had to remove was not nearly as large as I was fearing this is also and excellent. With that being said I do believe the patient is actually making progress here already towards seeing this improvement although again it is much too early to  tell that it is actually healing currently. There is Day lot of bleeding we want Day make sure that this does not turn into just Day blood clot or harder scabs on the use Xeroform gauze at this point helps keep this soft over the next week we will see where things stand following. I am very pleased however they were able to get her in so clinically that is awesome and I do appreciate that. 11-24-2022 upon evaluation patient's wound is showing signs of improvement this does seem to be improving as far as the overall appearance of the wound is concerned. I feel like that it is slowly clearing up to Day good surface underneath were not quite there yet but at the same time I think Xeroform is doing Day good job. 11/6; Day wound that was initially felt to be traumatic however Day biopsy of part of this showed squamous cell carcinoma. She underwent Mohs surgery on October 21. We were seeing her for the postop wound care. Apparently the margins of this were clear. She has been using Xeroform and Hydrologist) Signed: 12/01/2022 4:54:50 PM By: Baltazar Najjar MD Entered By: Baltazar Najjar on 12/01/2022 11:35:Day -------------------------------------------------------------------------------- Physical Exam Details Patient Name: Date of Service: Belinda Day. 12/01/2022 10:30 [name] Medical Record Number: [id number] Patient Account Number: [id number] Date of Birth/Sex: Treating RN: 03-04-Belinda Day (77 y.o. F) Primary Care Provider: Tana Conch Other Clinician: Referring Provider: Treating Provider/Extender: Jaquelyn Bitter in Treatment: 5 Belinda Day, Belinda Day ([id number]) [id number] Page 2 of 6 Notes Wound exam; left anterior lower leg. Generally Day clean looking surface. No debridement was required granulation looks reasonably healthy. Not really clear about the dimension improvement although the surface of the wound looks healthy Electronic  Signature(s) Signed: 12/01/2022 4:54:50 PM By: Baltazar Najjar MD Entered By: Baltazar Najjar on 12/01/2022 11:36:58 -------------------------------------------------------------------------------- Physician Orders Details Patient Name: Date of Service: Belinda Day. 12/01/2022 10:30 [name] Medical Record Number: [id number] Patient Account Number: [id number] Date of Birth/Sex: Treating RN: Belinda Day[birth year] (77 y.o. Belinda Day Primary Care Provider: Tana Conch Other Clinician: Referring Provider: Treating Provider/Extender: Jaquelyn Bitter in Treatment: 5 Verbal / Phone Orders: No Diagnosis Coding ICD-10 Coding Code Description [phone] Laceration without foreign body, left lower leg, initial encounter L03.116 Cellulitis of left lower limb E11.622 Type 2 diabetes mellitus with other skin ulcer L97.822 Non-pressure chronic ulcer of other part of left lower leg with fat layer exposed I10 Essential (primary) hypertension J44.89 Other specified chronic obstructive pulmonary disease Follow-up Appointments ppointment in 1 week. - Dr. Leanord Hawking Wednesday room 7 12/08/2022 at 11am Return Day ppointment in 2 weeks. - Wednesday Dr .Leanord Hawking room 7 12/15/22 at 11am (front office to schedule) Return Day Other: - may pick up at [facility] at [facility] for xeroform. Anesthetic (In clinic) Topical Lidocaine 5% applied to wound bed Bathing/ Shower/ Hygiene May shower and wash wound with soap and water. - Keep bandage on when in shower, at the end of shower remove dressing and wash with dial  Antibacterial soap. After shower, gentle pat dry and apply Xeroform and form border dressing Additional Orders / Instructions Other: - Punch Biopsy sent to [facility] labs. (10/27/22) Wound Treatment Wound #1 - Lower Leg Wound Laterality: Left, Anterior Cleanser: Soap and Water 1 x Per Day/30 Days Discharge Instructions: May shower and wash wound with dial antibacterial soap and  water prior to dressing change. Cleanser: Vashe 5.8 (oz) 1 x Per Day/30 Days Discharge Instructions: Cleanse the wound with Vashe prior to applying Day clean dressing using gauze sponges, not tissue or cotton balls. Prim Dressing: Xeroform Occlusive Gauze Dressing, 4x4 in (Generic) 1 x Per Day/30 Days ary Discharge Instructions: Apply to wound bed as instructed Secondary Dressing: Woven Gauze Sponge, Non-Sterile 4x4 in 1 x Per Day/30 Days Discharge Instructions: Apply over primary dressing as directed. Secured With: Insurance underwriter, Sterile 2x75 (in/in) 1 x Per Day/30 Days Discharge Instructions: Secure with stretch gauze as directed. Secured With: 6M [employer] Scientist, research (life sciences) Surgical T 2x10 (in/yd) 1 x Per Day/30 Days ape Discharge Instructions: Secure with tape as directed. Belinda Day, Belinda Day ([id number]) [id number] Page 3 of 6 Electronic Signature(s) Signed: 12/01/2022 4:54:50 PM By: Baltazar Najjar MD Signed: 12/02/2022 6:28:11 PM By: Karie Schwalbe RN Entered By: Karie Schwalbe on 12/01/2022 11:09:27 -------------------------------------------------------------------------------- Problem List Details Patient Name: Date of Service: Belinda Day. 12/01/2022 10:30 [name] Medical Record Number: [id number] Patient Account Number: [id number] Date of Birth/Sex: Treating RN: [birth year]-Belinda Day (77 y.o. Belinda Day, Belinda Day Primary Care Provider: Tana Conch Other Clinician: Referring Provider: Treating Provider/Extender: Jaquelyn Bitter in Treatment: 5 Active Problems ICD-10 Encounter Code Description Active Date MDM Diagnosis [phone] Laceration without foreign body, left lower leg, initial encounter 10/27/2022 No Yes L03.116 Cellulitis of left lower limb 10/27/2022 No Yes E11.622 Type 2 diabetes mellitus with other skin ulcer 10/27/2022 No Yes L97.822 Non-pressure chronic ulcer of other part of left lower leg with fat layer  exposed10/02/2022 No Yes I10 Essential (primary) hypertension 10/27/2022 No Yes J44.89 Other specified chronic obstructive pulmonary disease 10/27/2022 No Yes Inactive Problems Resolved Problems Electronic Signature(s) Signed: 12/01/2022 4:54:50 PM By: Baltazar Najjar MD Entered By: Baltazar Najjar on 12/01/2022 11:33:24 -------------------------------------------------------------------------------- Progress Note Details Patient Name: Date of Service: Belinda Day. 12/01/2022 10:30 [name] Medical Record Number: [id number] Patient Account Number: [id number] Date of Birth/Sex: Treating RN: Belinda Day, Belinda Day (77 y.o. F) Primary Care Provider: Tana Conch Other Clinician: Doneen Day ([id number]) [id number] Page 4 of 6 Referring Provider: Treating Provider/Extender: Jaquelyn Bitter in Treatment: 5 Subjective History of Present Illness (HPI) 10/27/2022 upon evaluation today patient presents for initial inspection here in the clinic regarding Day wound over the left anterior lower extremity. T back up Day o little bit and gives some history I am going to recount what the patient brought in written out on her sheet with her today which is an excellent history of the course of this wound. On August 3 the patient sustained an injury to the left leg due to Day fall off of Day ladder. This was essentially Day deep cut and continue to bleed for about 4 days. On August 7 she saw Dr. Lenn Cal at [facility] and was started on Keflex at that point. Subsequently on August 14 she went back to [facility] where she was placed on doxycycline 100 mg for 2 rounds. The next time that she was evaluated was actually about 3 weeks later when back at [facility] September  6 she was sent to the hospital due to what appeared to be worsening and started on vancomycin IV. September 7 she was actually discharged from the hospital with linezolid  600 mg. On September 18 she saw Dr. Anselm Jungling who recommended the patient go to [facility] long the next morning. On September Day she saw infectious disease and they ordered an MRI as well as putting the patient on Dalvance. This was by IV. The next appointment with infectious disease was September 26 at that point it was noted that the patient had Day number MRI and the results of that were reported out on 9-Day-2024 which showed that she had soft tissue swelling of the distal lower leg with focal skin thickening and enhancement anteriorly consistent with cellulitis no abscess. There was also Day thin periosteal edema and enhancement along the anterior medial aspect of the distal tibia this is stated to be likely reactive no definitive osteomyelitis noted. Subsequently she states that she did follow-up requesting to go to the wound center for further evaluation and was referred to [place] today October 2 for evaluation in the meantime she has been using Silvadene cream. Her most recent hemoglobin A1c was 6.6 she does have diabetes but otherwise no major medical problems other than hypertension and COPD that would affect her ability to heal. 11-03-2022 upon evaluation today patient appears to be doing okay currently in regard to her wound. I did actually perform Day biopsy last week and came back positive for squamous cell carcinoma. With that being said I discussed with the patient that she is likely can require Day full excision of this area and I recommended that she may need to go to the skin surgery center unless we get her in with her dermatologist sooner. I am not sure if they have Day Mohs surgeon at [facility] dermatology or not. With that being said we will get Day see about get in touch with them and finding out which I think will definitely be of benefit if so because it could be something when she gets in much faster. 11-17-2022 upon evaluation today patient presents for follow-up I last saw her on October 9 since  then she actually have the mobile skin surgery on November 15, 2022 that she has been 2 days ago. This actually went very well the area that they had to remove was not nearly as large as I was fearing this is also and excellent. With that being said I do believe the patient is actually making progress here already towards seeing this improvement although again it is much too early to tell that it is actually healing currently. There is Day lot of bleeding we want Day make sure that this does not turn into just Day blood clot or harder scabs on the use Xeroform gauze at this point helps keep this soft over the next week we will see where things stand following. I am very pleased however they were able to get her in so clinically that is awesome and I do appreciate that. 11-24-2022 upon evaluation patient's wound is showing signs of improvement this does seem to be improving as far as the overall appearance of the wound is concerned. I feel like that it is slowly clearing up to Day good surface underneath were not quite there yet but at the same time I think Xeroform is doing Day good job. 11/6; Day wound that was initially felt to be traumatic however Day biopsy of part of this showed squamous cell  carcinoma. She underwent Mohs surgery on October 21. We were seeing her for the postop wound care. Apparently the margins of this were clear. She has been using Xeroform and ZZetuvit Objective Constitutional Vitals Time Taken: 10:33 AM, Height: 61 in, Weight: 113 lbs, BMI: 21.3, Temperature: 98.3 F, Pulse: 80 bpm, Respiratory Rate: 16 breaths/min, Blood Pressure: 117/69 mmHg. Integumentary (Hair, Skin) Wound #1 status is Open. Original cause of wound was Skin T ear/Laceration. The date acquired was: 08/28/2022. The wound has been in treatment 5 weeks. The wound is located on the Left,Anterior Lower Leg. The wound measures 1.8cm length x 2.3cm width x 0.2cm depth; 3.252cm^2 area and 0.65cm^3 volume. There is Fat Layer  (Subcutaneous Tissue) exposed. There is no tunneling or undermining noted. There is Day small amount of serosanguineous drainage noted. The wound margin is distinct with the outline attached to the wound base. There is large (67-100%) red, hyper - granulation within the wound bed. There is no necrotic tissue within the wound bed. The periwound skin appearance did not exhibit: Callus, Crepitus, Excoriation, Induration, Rash, Scarring, Dry/Scaly, Maceration, Atrophie Blanche, Cyanosis, Ecchymosis, Hemosiderin Staining, Mottled, Pallor, Rubor, Erythema. Assessment Active Problems ICD-10 Laceration without foreign body, left lower leg, initial encounter Cellulitis of left lower limb Type 2 diabetes mellitus with other skin ulcer Non-pressure chronic ulcer of other part of left lower leg with fat layer exposed Essential (primary) hypertension Other specified chronic obstructive pulmonary disease Plan Belinda Day, Belinda Day ([id number]) [id number] Page 5 of 6 Follow-up Appointments: Return Appointment in 1 week. - Dr. Leanord Hawking Wednesday room 7 12/08/2022 at 11am Return Appointment in 2 weeks. - Wednesday Dr .Leanord Hawking room 7 12/15/22 at 11am (front office to schedule) Other: - may pick up at [facility] at [facility] for xeroform. Anesthetic: (In clinic) Topical Lidocaine 5% applied to wound bed Bathing/ Shower/ Hygiene: May shower and wash wound with soap and water. - Keep bandage on when in shower, at the end of shower remove dressing and wash with dial Antibacterial soap. After shower, gentle pat dry and apply Xeroform and form border dressing Additional Orders / Instructions: Other: - Punch Biopsy sent to [facility] labs. (10/27/22) WOUND #1: - Lower Leg Wound Laterality: Left, Anterior Cleanser: Soap and Water 1 x Per Day/30 Days Discharge Instructions: May shower and wash wound with dial antibacterial soap and water prior to dressing change. Cleanser: Vashe 5.8 (oz) 1  x Per Day/30 Days Discharge Instructions: Cleanse the wound with Vashe prior to applying Day clean dressing using gauze sponges, not tissue or cotton balls. Prim Dressing: Xeroform Occlusive Gauze Dressing, 4x4 in (Generic) 1 x Per Day/30 Days ary Discharge Instructions: Apply to wound bed as instructed Secondary Dressing: Woven Gauze Sponge, Non-Sterile 4x4 in 1 x Per Day/30 Days Discharge Instructions: Apply over primary dressing as directed. Secured With: Insurance underwriter, Sterile 2x75 (in/in) 1 x Per Day/30 Days Discharge Instructions: Secure with stretch gauze as directed. Secured With: 34M Medipore Scientist, research (life sciences) Surgical T 2x10 (in/yd) 1 x Per Day/30 Days ape Discharge Instructions: Secure with tape as directed. 1. I am continuing with the Xeroform #2 I am not totally convinced that the wound is improving hide I will need to see this again next week with careful attention to measurements and wound surface before determining what we use going forward. Electronic Signature(s) Signed: 12/01/2022 4:54:50 PM By: Baltazar Najjar MD Entered By: Baltazar Najjar on 12/01/2022 11:38:49 -------------------------------------------------------------------------------- SuperBill Details Patient Name: Date of Service: Belinda Day. 12/01/2022  Medical Record Number: [id number] Patient Account Number: [id number] Date of Birth/Sex: Treating RN: Belinda Day (77 y.o. Belinda Day Primary Care Provider: Tana Conch Other Clinician: Referring Provider: Treating Provider/Extender: Jaquelyn Bitter in Treatment: 5 Diagnosis Coding ICD-10 Codes Code Description [phone] Laceration without foreign body, left lower leg, initial encounter L03.116 Cellulitis of left lower limb E11.622 Type 2 diabetes mellitus with other skin ulcer L97.822 Non-pressure chronic ulcer of other part of left lower leg with fat layer exposed I10 Essential (primary)  hypertension J44.89 Other specified chronic obstructive pulmonary disease Facility Procedures : CPT4 Code: [id number] Description: 99213 - WOUND CARE VISIT-LEV 3 EST PT Modifier: Quantity: 1 Physician Procedures : CPT4 Code Description Modifier [id number] 99213 - WC PHYS LEVEL 3 - EST PT ICD-10 Diagnosis Description L97.822 Non-pressure chronic ulcer of other part of left lower leg with fat layer exposed S81.812A Laceration without foreign body, left lower leg,  initial encounter Belinda Day, Belinda Day ([id number]) [id number] Page [phone] Type 2 diabetes mellitus with other skin ulcer Quantity: 1 6 of 6 Electronic Signature(s) Signed: 12/01/2022 4:54:50 PM By: Baltazar Najjar MD Entered By: Baltazar Najjar on 12/01/2022 11:39:15

## 2022-12-08 ENCOUNTER — Encounter (HOSPITAL_BASED_OUTPATIENT_CLINIC_OR_DEPARTMENT_OTHER): Payer: Medicare Other | Admitting: Internal Medicine

## 2022-12-08 DIAGNOSIS — I10 Essential (primary) hypertension: Secondary | ICD-10-CM | POA: Diagnosis not present

## 2022-12-08 DIAGNOSIS — L97822 Non-pressure chronic ulcer of other part of left lower leg with fat layer exposed: Secondary | ICD-10-CM | POA: Diagnosis not present

## 2022-12-08 DIAGNOSIS — J4489 Other specified chronic obstructive pulmonary disease: Secondary | ICD-10-CM | POA: Diagnosis not present

## 2022-12-08 DIAGNOSIS — S81812A Laceration without foreign body, left lower leg, initial encounter: Secondary | ICD-10-CM | POA: Diagnosis not present

## 2022-12-08 DIAGNOSIS — E11622 Type 2 diabetes mellitus with other skin ulcer: Secondary | ICD-10-CM | POA: Diagnosis not present

## 2022-12-08 DIAGNOSIS — L03116 Cellulitis of left lower limb: Secondary | ICD-10-CM | POA: Diagnosis not present

## 2022-12-09 NOTE — Progress Notes (Signed)
Belinda, NEUFFER [name] ([id number]) [id number] Page 1 of 8 Visit Report for 12/08/2022 Arrival Information Details Patient Name: Date of Service: Belinda [name], Belinda [name]. 12/08/2022 11:00 [name] Medical Record Number: [id number] Patient Account Number: [id number] Date of Birth/Sex: Treating RN: 01/08/1946 (77 y.o. F) Primary Care Hubert Raatz: Tana Conch Other Clinician: Referring Abdulmalik Darco: Treating Seema Blum/Extender: Jaquelyn Bitter in Treatment: 6 Visit Information History Since Last Visit Added or deleted any medications: No Patient Arrived: Ambulatory Any new allergies or adverse reactions: No Arrival Time: 10:55 Had [name] fall or experienced change in No Accompanied By: self activities of daily living that may affect Transfer Assistance: None risk of falls: Patient Identification Verified: Yes Signs or symptoms of abuse/neglect since last visito No Secondary Verification Process Completed: Yes Hospitalized since last visit: No Patient Requires Transmission-Based Precautions: No Implantable device outside of the clinic excluding No Patient Has Alerts: Yes cellular tissue based products placed in the center Patient Alerts: Patient on Blood Thinner since last visit: aspirin 81mg  Has Dressing in Place as Prescribed: Yes No ABI- Very painful. Pain Present Now: No Electronic Signature(s) Signed: 12/08/2022 4:47:30 PM By: Thayer Dallas Entered By: Thayer Dallas on 12/08/2022 07:59:39 -------------------------------------------------------------------------------- Encounter Discharge Information Details Patient Name: Date of Service: Belinda Pluck RO N [name]. 12/08/2022 11:00 [name] Medical Record Number: [id number] Patient Account Number: [id number] Date of Birth/Sex: Treating RN: [birth year] (77 y.o. Arta Silence Primary Care Eira Alpert: Tana Conch Other Clinician: Referring Afia Messenger: Treating Abdul Beirne/Extender: Jaquelyn Bitter in Treatment: 6 Encounter Discharge Information Items Post Procedure Vitals Discharge Condition: Stable Temperature (F): 98.3 Ambulatory Status: Ambulatory Pulse (bpm): 71 Discharge Destination: Home Respiratory Rate (breaths/min): 18 Transportation: Private Auto Blood Pressure (mmHg): 132/70 Accompanied By: self Schedule Follow-up Appointment: Yes Clinical Summary of Care: Electronic Signature(s) Signed: 12/08/2022 4:55:38 PM By: Shawn Stall RN, BSN Entered By: Shawn Stall on 12/08/2022 08:36:04 [name] ([id number]) [id number] Page 2 of 8 -------------------------------------------------------------------------------- Lower Extremity Assessment Details Patient Name: Date of Service: Belinda, ZELINSKI [name]. 12/08/2022 11:00 [name] Medical Record Number: [id number] Patient Account Number: [id number] Date of Birth/Sex: Treating RN: Jan 05, 1946 (77 y.o. F) Primary Care Ely Ballen: Tana Conch Other Clinician: Referring Adreyan Carbajal: Treating Ancel Easler/Extender: Jaquelyn Bitter in Treatment: 6 Edema Assessment Assessed: Kyra Searles: No] [Right: No] Edema: [Left: N] [Right: o] Calf Left: Right: Point of Measurement: From Medial Instep 31.2 cm Ankle Left: Right: Point of Measurement: From Medial Instep 19.5 cm Vascular Assessment Extremity colors, hair growth, and conditions: Extremity Color: [Left:Normal] Hair Growth on Extremity: [Left:Yes] Temperature of Extremity: [Left:Warm] Capillary Refill: [Left:< 3 seconds] Dependent Rubor: [Left:No No] Electronic Signature(s) Signed: 12/08/2022 4:47:30 PM By: Thayer Dallas Entered By: Thayer Dallas on 12/08/2022 08:06:09 -------------------------------------------------------------------------------- Multi Wound Chart Details Patient Name: Date of Service: Belinda Pluck RO N [name]. 12/08/2022 11:00 [name] Medical Record Number: [id number] Patient Account Number: [id number] Date  of Birth/Sex: Treating RN: 04-23-1945 (77 y.o. F) Primary Care [name]: Tana Conch Other Clinician: Referring Zaeden Lastinger: Treating Alesa Echevarria/Extender: Jaquelyn Bitter in Treatment: 6 Vital Signs Height(in): 61 Pulse(bpm): 71 Weight(lbs): 113 Blood Pressure(mmHg): 132/70 Body Mass Index(BMI): 21.3 Temperature(F): 98.3 Respiratory Rate(breaths/min): 18 [1:Photos:] [N/[name]:N/[name]] Left, Anterior Lower Leg N/[name] N/[name] Wound Location: Skin T ear/Laceration N/[name] N/[name] Wounding Event: Cellulitis N/[name] N/[name] Primary Etiology: Diabetic Wound/Ulcer of the Lower N/[name] N/[name] Secondary Etiology: Extremity Chronic Obstructive Pulmonary N/[name] N/[name] Comorbid History: Disease (COPD), Coronary Artery Disease, Hypertension, Type II Diabetes 08/28/2022 N/[name] N/[name] Date Acquired: 6 N/[name] N/[name]  Weeks of Treatment: Open N/[name] N/[name] Wound Status: No N/[name] N/[name] Wound Recurrence: 1.8x2.2x0.2 N/[name] N/[name] Measurements L x W x D (cm) 3.11 N/[name] N/[name] [name] (cm) : rea 0.622 N/[name] N/[name] Volume (cm) : -296.20% N/[name] N/[name] % Reduction in [name] rea: -687.30% N/[name] N/[name] % Reduction in Volume: Full Thickness Without Exposed N/[name] N/[name] Classification: Support Structures Small N/[name] N/[name] Exudate [name] mount: Serosanguineous N/[name] N/[name] Exudate Type: red, brown N/[name] N/[name] Exudate Color: Distinct, outline attached N/[name] N/[name] Wound Margin: Large (67-100%) N/[name] N/[name] Granulation [name] mount: Red N/[name] N/[name] Granulation Quality: Small (1-33%) N/[name] N/[name] Necrotic [name] mount: Fat Layer (Subcutaneous Tissue): Yes N/[name] N/[name] Exposed Structures: Fascia: No Tendon: No Muscle: No Joint: No Bone: No Small (1-33%) N/[name] N/[name] Epithelialization: Debridement - Excisional N/[name] N/[name] Debridement: Pre-procedure Verification/Time Out 11:25 N/[name] N/[name] Taken: Lidocaine 4% Topical Solution N/[name] N/[name] Pain Control: Subcutaneous, Slough N/[name] N/[name] Tissue Debrided: Skin/Subcutaneous Tissue N/[name] N/[name] Level: 3.11 N/[name] N/[name] Debridement [name] (sq cm): rea Curette N/[name] N/[name] Instrument: Minimum N/[name]  N/[name] Bleeding: Pressure N/[name] N/[name] Hemostasis [name] chieved: 0 N/[name] N/[name] Procedural Pain: 0 N/[name] N/[name] Post Procedural Pain: Procedure was tolerated well N/[name] N/[name] Debridement Treatment Response: 1.8x2.2x0.2 N/[name] N/[name] Post Debridement Measurements L x W x D (cm) 0.622 N/[name] N/[name] Post Debridement Volume: (cm) Excoriation: No N/[name] N/[name] Periwound Skin Texture: Induration: No Callus: No Crepitus: No Rash: No Scarring: No Maceration: No N/[name] N/[name] Periwound Skin Moisture: Dry/Scaly: No Atrophie Blanche: No N/[name] N/[name] Periwound Skin Color: Cyanosis: No Ecchymosis: No Erythema: No Hemosiderin Staining: No Mottled: No Pallor: No Rubor: No No Abnormality N/[name] N/[name] Temperature: Debridement N/[name] N/[name] Procedures Performed: Treatment Notes Wound #1 (Lower Leg) Wound Laterality: Left, Anterior Cleanser Soap and Water Discharge Instruction: May shower and wash wound with dial antibacterial soap and water prior to dressing change. Vashe 5.8 (oz) Discharge Instruction: Cleanse the wound with Vashe prior to applying [name] clean dressing using gauze sponges, not tissue or cotton balls. JAZ, MARKWELL [name] ([id number]) [id number] Page 4 of 8 Peri-Wound Care Topical Primary Dressing Hydrofera Blue Ready Transfer Foam, 2.5x2.5 (in/in) Discharge Instruction: Apply directly to wound bed as directed Secondary Dressing Woven Gauze Sponge, Non-Sterile 4x4 in Discharge Instruction: Apply over primary dressing as directed. Secured With Conforming Stretch Gauze Bandage, Sterile 2x75 (in/in) Discharge Instruction: Secure with stretch gauze as directed. 76M Medipore Soft Cloth Surgical T 2x10 (in/yd) ape Discharge Instruction: Secure with tape as directed. Compression Wrap Compression Stockings Add-Ons Electronic Signature(s) Signed: 12/09/2022 4:06:32 PM By: Baltazar Najjar MD Entered By: Baltazar Najjar on 12/08/2022  08:43:31 -------------------------------------------------------------------------------- Multi-Disciplinary Care Plan Details Patient Name: Date of Service: Belinda Pluck RO N [name]. 12/08/2022 11:00 [name] Medical Record Number: [id number] Patient Account Number: [id number] Date of Birth/Sex: Treating RN: [birth year] (77 y.o. Arta Silence Primary Care Shiniqua Groseclose: Tana Conch Other Clinician: Referring Lucynda Rosano: Treating Williom Cedar/Extender: Jaquelyn Bitter in Treatment: 6 Active Inactive Wound/Skin Impairment Nursing Diagnoses: Impaired tissue integrity Goals: Patient/caregiver will verbalize understanding of skin care regimen Date Initiated: 10/27/2022 Target Resolution Date: 01/25/2023 Goal Status: Active Interventions: Assess ulceration(s) every visit Treatment Activities: Skin care regimen initiated : 10/27/2022 Notes: Electronic Signature(s) Signed: 12/08/2022 4:55:38 PM By: Shawn Stall RN, BSN Entered By: Shawn Stall on 12/08/2022 08:10:50 Brayton Layman [name] ([id number]) [id number] Page 5 of 8 -------------------------------------------------------------------------------- Pain Assessment Details Patient Name: Date of Service: NICHOLETTE, TERRELL [name]. 12/08/2022 11:00 [name] Medical Record Number: [id number] Patient Account Number: [id number] Date of Birth/Sex: Treating RN: 22-Apr-1945 (77 y.o. F) Primary Care  Rosario Kushner: Tana Conch Other Clinician: Referring Ginette Bradway: Treating Zuha Dejonge/Extender: Jaquelyn Bitter in Treatment: 6 Active Problems Location of Pain Severity and Description of Pain Patient Has Paino No Site Locations Pain Management and Medication Current Pain Management: Electronic Signature(s) Signed: 12/08/2022 4:47:30 PM By: Thayer Dallas Entered By: Thayer Dallas on 12/08/2022 08:01:37 -------------------------------------------------------------------------------- Patient/Caregiver  Education Details Patient Name: Date of Service: Jorge Ny 11/13/2024andnbsp11:00 [name] Medical Record Number: [id number] Patient Account Number: [id number] Date of Birth/Gender: Treating RN: [birth year] (77 y.o. Arta Silence Primary Care Physician: Tana Conch Other Clinician: Referring Physician: Treating Physician/Extender: Jaquelyn Bitter in Treatment: 6 Education Assessment Education Provided To: Patient Education Topics Provided Wound/Skin Impairment: Handouts: Caring for Your Ulcer Methods: Explain/Verbal Responses: Reinforcements needed NELIAH, DEBELLO [name] ([id number]) [id number] Page 6 of 8 Electronic Signature(s) Signed: 12/08/2022 4:55:38 PM By: Shawn Stall RN, BSN Entered By: Shawn Stall on 12/08/2022 08:11:00 -------------------------------------------------------------------------------- Wound Assessment Details Patient Name: Date of Service: Belinda Pluck RO N [name]. 12/08/2022 11:00 [name] Medical Record Number: [id number] Patient Account Number: [id number] Date of Birth/Sex: Treating RN: 06/24/1945 (77 y.o. F) Primary Care Adaliz Dobis: Tana Conch Other Clinician: Referring Khaliel Morey: Treating Jarrel Knoke/Extender: Jaquelyn Bitter in Treatment: 6 Wound Status Wound Number: 1 Primary Cellulitis Etiology: Wound Location: Left, Anterior Lower Leg Secondary Diabetic Wound/Ulcer of the Lower Extremity Wounding Event: Skin Tear/Laceration Etiology: Date Acquired: 08/28/2022 Wound Open Weeks Of Treatment: 6 Status: Clustered Wound: No Comorbid Chronic Obstructive Pulmonary Disease (COPD), Coronary History: Artery Disease, Hypertension, Type II Diabetes Photos Wound Measurements Length: (cm) 1.8 Width: (cm) 2.2 Depth: (cm) 0.2 Area: (cm) 3.11 Volume: (cm) 0.622 % Reduction in Area: -296.2% % Reduction in Volume: -687.3% Epithelialization: Small (1-33%) Tunneling: No Undermining:  No Wound Description Classification: Full Thickness Without Exposed Suppor Wound Margin: Distinct, outline attached Exudate Amount: Small Exudate Type: Serosanguineous Exudate Color: red, brown t Structures Foul Odor After Cleansing: No Slough/Fibrino Yes Wound Bed Granulation Amount: Large (67-100%) Exposed Structure Granulation Quality: Red Fascia Exposed: No Necrotic Amount: Small (1-33%) Fat Layer (Subcutaneous Tissue) Exposed: Yes Necrotic Quality: Adherent Slough Tendon Exposed: No Muscle Exposed: No Joint Exposed: No Bone Exposed: No Periwound Skin Texture Texture Color No Abnormalities Noted: No No Abnormalities Noted: No Callus: No Atrophie Blanche: No Len, Rakeisha [name] ([id number]) [id number] Page 7 of 8 Crepitus: No Cyanosis: No Excoriation: No Ecchymosis: No Induration: No Erythema: No Rash: No Hemosiderin Staining: No Scarring: No Mottled: No Pallor: No Moisture Rubor: No No Abnormalities Noted: No Dry / Scaly: No Temperature / Pain Maceration: No Temperature: No Abnormality Treatment Notes Wound #1 (Lower Leg) Wound Laterality: Left, Anterior Cleanser Soap and Water Discharge Instruction: May shower and wash wound with dial antibacterial soap and water prior to dressing change. Vashe 5.8 (oz) Discharge Instruction: Cleanse the wound with Vashe prior to applying [name] clean dressing using gauze sponges, not tissue or cotton balls. Peri-Wound Care Topical Primary Dressing Hydrofera Blue Ready Transfer Foam, 2.5x2.5 (in/in) Discharge Instruction: Apply directly to wound bed as directed Secondary Dressing Woven Gauze Sponge, Non-Sterile 4x4 in Discharge Instruction: Apply over primary dressing as directed. Secured With Conforming Stretch Gauze Bandage, Sterile 2x75 (in/in) Discharge Instruction: Secure with stretch gauze as directed. 25M Medipore Soft Cloth Surgical T 2x10 (in/yd) ape Discharge Instruction: Secure with tape  as directed. Compression Wrap Compression Stockings Add-Ons Electronic Signature(s) Signed: 12/08/2022 4:47:30 PM By: Thayer Dallas Entered By: Thayer Dallas on 12/08/2022 08:08:35 -------------------------------------------------------------------------------- Vitals Details Patient Name: Date of Service: Constance Holster,  SHA RO N [name]. 12/08/2022 11:00 [name] Medical Record Number: [id number] Patient Account Number: [id number] Date of Birth/Sex: Treating RN: 05/21/1945 (77 y.o. F) Primary Care Montel Vanderhoof: Tana Conch Other Clinician: Referring Desirie Minteer: Treating Hai Grabe/Extender: Jaquelyn Bitter in Treatment: 6 Vital Signs Time Taken: 10:59 Temperature (F): 98.3 Height (in): 61 Pulse (bpm): 71 Weight (lbs): 113 Respiratory Rate (breaths/min): 18 Body Mass Index (BMI): 21.3 Blood Pressure (mmHg): 132/70 Reference Range: 80 - 120 mg / dl Electronic Signature(s) Squitieri, Emiliya [name] ([id number]) [id number] Page 8 of 8 Signed: 12/08/2022 4:47:30 PM By: Thayer Dallas Entered By: Thayer Dallas on 12/08/2022 08:01:30

## 2022-12-09 NOTE — Progress Notes (Signed)
Belinda Belinda [name] Belinda [name] ([id number]) [id number] Page 1 of 7 Visit Report for 12/08/2022 Debridement Details Patient Name: Date of Service: Belinda Belinda [name], Belinda Belinda [name]. 12/08/2022 11:00 Belinda [name] Medical Record Number: [id number] Patient Account Number: [id number] Date of Birth/Sex: Treating RN: [birth year] (77 y.o. Arta Silence Primary Care Provider: Tana Conch Other Clinician: Referring Provider: Treating Provider/Extender: Jaquelyn Bitter in Treatment: 6 Debridement Performed for Assessment: Wound #1 Left,Anterior Lower Leg Performed By: Physician Maxwell Caul., MD The following information was scribed by: Shawn Stall The information was scribed for: Baltazar Najjar Debridement Type: Debridement Severity of Tissue Pre Debridement: Fat layer exposed Level of Consciousness (Pre-procedure): Awake and Alert Pre-procedure Verification/Time Out Yes - 11:25 Taken: Start Time: 11:26 Pain Control: Lidocaine 4% T opical Solution Percent of Wound Bed Debrided: 100% T Area Debrided (cm): otal 3.11 Tissue and other material debrided: Viable, Non-Viable, Slough, Subcutaneous, Slough Level: Skin/Subcutaneous Tissue Debridement Description: Excisional Instrument: Curette Bleeding: Minimum Hemostasis Achieved: Pressure End Time: 11:30 Procedural Pain: 0 Post Procedural Pain: 0 Response to Treatment: Procedure was tolerated well Level of Consciousness (Post- Awake and Alert procedure): Post Debridement Measurements of Total Wound Length: (cm) 1.8 Width: (cm) 2.2 Depth: (cm) 0.2 Volume: (cm) 0.622 Character of Wound/Ulcer Post Debridement: Improved Severity of Tissue Post Debridement: Fat layer exposed Post Procedure Diagnosis Same as Pre-procedure Electronic Signature(s) Signed: 12/08/2022 4:55:38 PM By: Shawn Stall RN, BSN Signed: 12/09/2022 4:06:32 PM By: Baltazar Najjar MD Entered By: Shawn Stall on 12/08/2022  08:31:06 -------------------------------------------------------------------------------- HPI Details Patient Name: Date of Service: Belinda Pluck RO N Belinda [name]. 12/08/2022 11:00 Belinda [name] Medical Record Number: [id number] Patient Account Number: [id number] Date of Birth/Sex: Treating RN: [birth year] (77 y.o. F) Primary Care Provider: Tana Conch Other Clinician: Referring Provider: Treating Provider/Extender: Daphene Calamity, Vergia Alberts ([id number]) [id number] Page 2 of 7 Weeks in Treatment: 6 History of Present Illness HPI Description: 10/27/2022 upon evaluation today patient presents for initial inspection here in the clinic regarding Belinda [name] wound over the left anterior lower extremity. T back up Belinda [name] little bit and gives some history I am going to recount what the patient brought in written out on her sheet with her today which is an o excellent history of the course of this wound. On August 3 the patient sustained an injury to the left leg due to Belinda [name] fall off of Belinda [name] ladder. This was essentially Belinda [name] deep cut and continue to bleed for about 4 days. On August 7 she saw Dr. Lenn Cal at [facility] and was started on Keflex at that point. Subsequently on August 14 she went back to [facility] where she was placed on doxycycline 100 mg for 2 rounds. The next time that she was evaluated was actually about 3 weeks later when back at [facility] September 6 she was sent to the hospital due to what appeared to be worsening and started on vancomycin IV. September 7 she was actually discharged from the hospital with linezolid 600 mg. On September 18 she saw Dr. Anselm Jungling who recommended the patient go to [facility] long the next morning. On September 19 she saw infectious disease and they ordered an MRI as well as putting the patient on Dalvance. This was by IV. The next appointment with infectious disease was September 26 at that point it was noted  that the patient had Belinda [name] number MRI and the results of that were reported out on 10-14-2022 which showed that she had soft tissue swelling of the  distal lower leg with focal skin thickening and enhancement anteriorly consistent with cellulitis no abscess. There was also Belinda [name] thin periosteal edema and enhancement along the anterior medial aspect of the distal tibia this is stated to be likely reactive no definitive osteomyelitis noted. Subsequently she states that she did follow-up requesting to go to the wound center for further evaluation and was referred to [place] today October 2 for evaluation in the meantime she has been using Silvadene cream. Her most recent hemoglobin A1c was 6.6 she does have diabetes but otherwise no major medical problems other than hypertension and COPD that would affect her ability to heal. 11-03-2022 upon evaluation today patient appears to be doing okay currently in regard to her wound. I did actually perform Belinda [name] biopsy last week and came back positive for squamous cell carcinoma. With that being said I discussed with the patient that she is likely can require Belinda [name] full excision of this area and I recommended that she may need to go to the skin surgery center unless we get her in with her dermatologist sooner. I am not sure if they have Belinda [name] Mohs surgeon at [facility] dermatology or not. With that being said we will get Belinda [name] see about get in touch with them and finding out which I think will definitely be of benefit if so because it could be something when she gets in much faster. 11-17-2022 upon evaluation today patient presents for follow-up I last saw her on October 9 since then she actually have the mobile skin surgery on November 15, 2022 that she has been 2 days ago. This actually went very well the area that they had to remove was not nearly as large as I was fearing this is also and excellent. With that being said I do believe the patient is actually making progress here already towards  seeing this improvement although again it is much too early to tell that it is actually healing currently. There is Belinda [name] lot of bleeding we want Belinda [name] make sure that this does not turn into just Belinda [name] blood clot or harder scabs on the use Xeroform gauze at this point helps keep this soft over the next week we will see where things stand following. I am very pleased however they were able to get her in so clinically that is awesome and I do appreciate that. 11-24-2022 upon evaluation patient's wound is showing signs of improvement this does seem to be improving as far as the overall appearance of the wound is concerned. I feel like that it is slowly clearing up to Belinda [name] good surface underneath were not quite there yet but at the same time I think Xeroform is doing Belinda [name] good job. 11/6; Belinda [name] wound that was initially felt to be traumatic however Belinda [name] biopsy of part of this showed squamous cell carcinoma. She underwent Mohs surgery on October 21. We were seeing her for the postop wound care. Apparently the margins of this were clear. She has been using Xeroform and ZZetuvit 11/13; Mohs surgery site. Xeroform and gauze. Comes in the clinic with Belinda [name] nonviable surface. Electronic Signature(s) Signed: 12/09/2022 4:06:32 PM By: Baltazar Najjar MD Entered By: Baltazar Najjar on 12/08/2022 08:44:43 -------------------------------------------------------------------------------- Physical Exam Details Patient Name: Date of Service: Belinda Pluck RO N Belinda [name]. 12/08/2022 11:00 Belinda [name] Medical Record Number: [id number] Patient Account Number: [id number] Date of Birth/Sex: Treating RN: 1945/06/20 (77 y.o. F) Primary Care Provider: Tana Conch Other Clinician: Referring Provider: Treating Provider/Extender: Jaquelyn Bitter in Treatment: 6  Constitutional Sitting or standing Blood Pressure is within target range for patient.. Pulse regular and within target range for patient.Marland Kitchen Respirations regular, non-labored and within  target range.. Temperature is normal and within the target range for the patient.Marland Kitchen Appears in no distress. Notes Wound exam; left anterior lower leg. Underlying illumination Belinda [name] tight fibrinous surface debris. Debridement with Belinda [name] #5 curette she did not tolerate this well. Adherent slough subcutaneous tissue minimal bleeding Electronic Signature(s) Signed: 12/09/2022 4:06:32 PM By: Baltazar Najjar MD Entered By: Baltazar Najjar on 12/08/2022 08:45:52 Brayton Layman Belinda [name] ([id number]) [id number] Page 3 of 7 -------------------------------------------------------------------------------- Physician Orders Details Patient Name: Date of Service: MISHA, SHERMAN Belinda [name]. 12/08/2022 11:00 Belinda [name] Medical Record Number: [id number] Patient Account Number: [id number] Date of Birth/Sex: Treating RN: [birth year] (77 y.o. Debara Pickett, Millard.Loa Primary Care Provider: Tana Conch Other Clinician: Referring Provider: Treating Provider/Extender: Jaquelyn Bitter in Treatment: 6 The following information was scribed by: Shawn Stall The information was scribed for: Baltazar Najjar Verbal / Phone Orders: No Diagnosis Coding ICD-10 Coding Code Description S81.812A Laceration without foreign body, left lower leg, initial encounter L03.116 Cellulitis of left lower limb E11.622 Type 2 diabetes mellitus with other skin ulcer L97.822 Non-pressure chronic ulcer of other part of left lower leg with fat layer exposed I10 Essential (primary) hypertension J44.89 Other specified chronic obstructive pulmonary disease Follow-up Appointments ppointment in 1 week. - Wednesday Dr .Leanord Hawking room 7 12/15/22 at 11am (already scheduled) Return Belinda [name] Return appointment in 3 weeks. - ****Skip Thanksgiving week**** Per patient has to skip the first week of December due to her tests with Gastroenterology- Per patient may return on or after 01/05/2023. Anesthetic (In clinic) Topical Lidocaine 5%  applied to wound bed Bathing/ Shower/ Hygiene May shower and wash wound with soap and water. - Keep bandage on when in shower, at the end of shower remove dressing and wash with dial Antibacterial soap. After shower, gentle pat dry and apply Xeroform and form border dressing Additional Orders / Instructions Other: - Punch Biopsy sent to [facility] labs. (10/27/22)- Done Wound Treatment Wound #1 - Lower Leg Wound Laterality: Left, Anterior Cleanser: Soap and Water Every Other Belinda [name]/30 Days Discharge Instructions: May shower and wash wound with dial antibacterial soap and water prior to dressing change. Cleanser: Vashe 5.8 (oz) Every Other Belinda [name]/30 Days Discharge Instructions: Cleanse the wound with Vashe prior to applying Belinda [name] clean dressing using gauze sponges, not tissue or cotton balls. Prim Dressing: Hydrofera Blue Ready Transfer Foam, 2.5x2.5 (in/in) Every Other Belinda [name]/30 Days ary Discharge Instructions: Apply directly to wound bed as directed Secondary Dressing: Woven Gauze Sponge, Non-Sterile 4x4 in Every Other Belinda [name]/30 Days Discharge Instructions: Apply over primary dressing as directed. Secured With: Insurance underwriter, Sterile 2x75 (in/in) Every Other Belinda [name]/30 Days Discharge Instructions: Secure with stretch gauze as directed. Secured With: 65M [employer] Scientist, research (life sciences) Surgical T 2x10 (in/yd) Every Other Belinda [name]/30 Days ape Discharge Instructions: Secure with tape as directed. Electronic Signature(s) Signed: 12/08/2022 4:55:38 PM By: Shawn Stall RN, BSN Signed: 12/09/2022 4:06:32 PM By: Baltazar Najjar MD Entered By: Shawn Stall on 12/08/2022 08:34:11 Belinda Belinda [name] ([id number]) [id number] Page 4 of 7 -------------------------------------------------------------------------------- Problem List Details Patient Name: Date of Service: ROSHANA, PARNES Belinda [name]. 12/08/2022 11:00 Belinda [name] Medical Record Number: [id number] Patient Account Number: [id number] Date of Birth/Sex:  Treating RN: 05-16-1945 (77 y.o. Arta Silence Primary Care Provider: Tana Conch Other Clinician: Referring Provider: Treating Provider/Extender: Jaquelyn Bitter in Treatment: 6 Active Problems ICD-10  Encounter Code Description Active Date MDM Diagnosis S81.812A Laceration without foreign body, left lower leg, initial encounter 10/27/2022 No Yes L03.116 Cellulitis of left lower limb 10/27/2022 No Yes E11.622 Type 2 diabetes mellitus with other skin ulcer 10/27/2022 No Yes L97.822 Non-pressure chronic ulcer of other part of left lower leg with fat layer exposed10/02/2022 No Yes I10 Essential (primary) hypertension 10/27/2022 No Yes J44.89 Other specified chronic obstructive pulmonary disease 10/27/2022 No Yes Inactive Problems Resolved Problems Electronic Signature(s) Signed: 12/09/2022 4:06:32 PM By: Baltazar Najjar MD Entered By: Baltazar Najjar on 12/08/2022 08:43:22 -------------------------------------------------------------------------------- Progress Note Details Patient Name: Date of Service: Belinda Pluck RO N Belinda [name]. 12/08/2022 11:00 Belinda [name] Medical Record Number: [id number] Patient Account Number: [id number] Date of Birth/Sex: Treating RN: 1945/03/25 (77 y.o. F) Primary Care Provider: Tana Conch Other Clinician: Referring Provider: Treating Provider/Extender: Jaquelyn Bitter in Treatment: 6 Subjective History of Present Illness (HPI) 10/27/2022 upon evaluation today patient presents for initial inspection here in the clinic regarding Belinda [name] wound over the left anterior lower extremity. T back up Belinda [name] o Belinda Belinda [name], Belinda Belinda [name] ([id number]) [id number] Page 5 of 7 little bit and gives some history I am going to recount what the patient brought in written out on her sheet with her today which is an excellent history of the course of this wound. On August 3 the patient sustained an injury to the left leg due to Belinda [name] fall  off of Belinda [name] ladder. This was essentially Belinda [name] deep cut and continue to bleed for about 4 days. On August 7 she saw Dr. Lenn Cal at [facility] and was started on Keflex at that point. Subsequently on August 14 she went back to [facility] where she was placed on doxycycline 100 mg for 2 rounds. The next time that she was evaluated was actually about 3 weeks later when back at [facility] September 6 she was sent to the hospital due to what appeared to be worsening and started on vancomycin IV. September 7 she was actually discharged from the hospital with linezolid 600 mg. On September 18 she saw Dr. Anselm Jungling who recommended the patient go to [facility] long the next morning. On September 19 she saw infectious disease and they ordered an MRI as well as putting the patient on Dalvance. This was by IV. The next appointment with infectious disease was September 26 at that point it was noted that the patient had Belinda [name] number MRI and the results of that were reported out on 10-14-2022 which showed that she had soft tissue swelling of the distal lower leg with focal skin thickening and enhancement anteriorly consistent with cellulitis no abscess. There was also Belinda [name] thin periosteal edema and enhancement along the anterior medial aspect of the distal tibia this is stated to be likely reactive no definitive osteomyelitis noted. Subsequently she states that she did follow-up requesting to go to the wound center for further evaluation and was referred to [place] today October 2 for evaluation in the meantime she has been using Silvadene cream. Her most recent hemoglobin A1c was 6.6 she does have diabetes but otherwise no major medical problems other than hypertension and COPD that would affect her ability to heal. 11-03-2022 upon evaluation today patient appears to be doing okay currently in regard to her wound. I did actually perform Belinda [name] biopsy last week and came back positive for squamous cell carcinoma.  With that being said I discussed with the patient that she is likely can require Belinda [name] full excision of  this area and I recommended that she may need to go to the skin surgery center unless we get her in with her dermatologist sooner. I am not sure if they have Belinda [name] Mohs surgeon at [facility] dermatology or not. With that being said we will get Belinda [name] see about get in touch with them and finding out which I think will definitely be of benefit if so because it could be something when she gets in much faster. 11-17-2022 upon evaluation today patient presents for follow-up I last saw her on October 9 since then she actually have the mobile skin surgery on November 15, 2022 that she has been 2 days ago. This actually went very well the area that they had to remove was not nearly as large as I was fearing this is also and excellent. With that being said I do believe the patient is actually making progress here already towards seeing this improvement although again it is much too early to tell that it is actually healing currently. There is Belinda [name] lot of bleeding we want Belinda [name] make sure that this does not turn into just Belinda [name] blood clot or harder scabs on the use Xeroform gauze at this point helps keep this soft over the next week we will see where things stand following. I am very pleased however they were able to get her in so clinically that is awesome and I do appreciate that. 11-24-2022 upon evaluation patient's wound is showing signs of improvement this does seem to be improving as far as the overall appearance of the wound is concerned. I feel like that it is slowly clearing up to Belinda [name] good surface underneath were not quite there yet but at the same time I think Xeroform is doing Belinda [name] good job. 11/6; Belinda [name] wound that was initially felt to be traumatic however Belinda [name] biopsy of part of this showed squamous cell carcinoma. She underwent Mohs surgery on October 21. We were seeing her for the postop wound care. Apparently the margins of this were  clear. She has been using Xeroform and ZZetuvit 11/13; Mohs surgery site. Xeroform and gauze. Comes in the clinic with Belinda [name] nonviable surface. Objective Constitutional Sitting or standing Blood Pressure is within target range for patient.. Pulse regular and within target range for patient.Marland Kitchen Respirations regular, non-labored and within target range.. Temperature is normal and within the target range for the patient.Marland Kitchen Appears in no distress. Vitals Time Taken: 10:59 AM, Height: 61 in, Weight: 113 lbs, BMI: 21.3, Temperature: 98.3 F, Pulse: 71 bpm, Respiratory Rate: 18 breaths/min, Blood Pressure: 132/70 mmHg. General Notes: Wound exam; left anterior lower leg. Underlying illumination Belinda [name] tight fibrinous surface debris. Debridement with Belinda [name] #5 curette she did not tolerate this well. Adherent slough subcutaneous tissue minimal bleeding Integumentary (Hair, Skin) Wound #1 status is Open. Original cause of wound was Skin T ear/Laceration. The date acquired was: 08/28/2022. The wound has been in treatment 6 weeks. The wound is located on the Left,Anterior Lower Leg. The wound measures 1.8cm length x 2.2cm width x 0.2cm depth; 3.11cm^2 area and 0.622cm^3 volume. There is Fat Layer (Subcutaneous Tissue) exposed. There is no tunneling or undermining noted. There is Belinda [name] small amount of serosanguineous drainage noted. The wound margin is distinct with the outline attached to the wound base. There is large (67-100%) red granulation within the wound bed. There is Belinda [name] small (1-33%) amount of necrotic tissue within the wound bed including Adherent Slough. The periwound skin appearance did not exhibit: Callus, Crepitus, Excoriation, Induration, Rash, Scarring,  Dry/Scaly, Maceration, Atrophie Blanche, Cyanosis, Ecchymosis, Hemosiderin Staining, Mottled, Pallor, Rubor, Erythema. Periwound temperature was noted as No Abnormality. Assessment Active Problems ICD-10 Laceration without foreign body, left lower leg, initial  encounter Cellulitis of left lower limb Type 2 diabetes mellitus with other skin ulcer Non-pressure chronic ulcer of other part of left lower leg with fat layer exposed Essential (primary) hypertension Other specified chronic obstructive pulmonary disease Belinda Belinda [name], Belinda Belinda [name] ([id number]) [id number] Page 6 of 7 Procedures Wound #1 Pre-procedure diagnosis of Wound #1 is Belinda [name] Cellulitis located on the Left,Anterior Lower Leg .Severity of Tissue Pre Debridement is: Fat layer exposed. There was Belinda [name] Excisional Skin/Subcutaneous Tissue Debridement with Belinda [name] total area of 3.11 sq cm performed by Maxwell Caul., MD. With the following instrument(s): Curette to remove Viable and Non-Viable tissue/material. Material removed includes Subcutaneous Tissue and Slough and after achieving pain control using Lidocaine 4% T opical Solution. Belinda [name] time out was conducted at 11:25, prior to the start of the procedure. Belinda [name] Minimum amount of bleeding was controlled with Pressure. The procedure was tolerated well with Belinda [name] pain level of 0 throughout and Belinda [name] pain level of 0 following the procedure. Post Debridement Measurements: 1.8cm length x 2.2cm width x 0.2cm depth; 0.622cm^3 volume. Character of Wound/Ulcer Post Debridement is improved. Severity of Tissue Post Debridement is: Fat layer exposed. Post procedure Diagnosis Wound #1: Same as Pre-Procedure Plan Follow-up Appointments: Return Appointment in 1 week. - Wednesday Dr .Leanord Hawking room 7 12/15/22 at 11am (already scheduled) Return appointment in 3 weeks. - ****Skip Thanksgiving week**** Per patient has to skip the first week of December due to her tests with Gastroenterology- Per patient may return on or after 01/05/2023. Anesthetic: (In clinic) Topical Lidocaine 5% applied to wound bed Bathing/ Shower/ Hygiene: May shower and wash wound with soap and water. - Keep bandage on when in shower, at the end of shower remove dressing and wash with dial  Antibacterial soap. After shower, gentle pat dry and apply Xeroform and form border dressing Additional Orders / Instructions: Other: - Punch Biopsy sent to [facility] labs. (10/27/22)- Done WOUND #1: - Lower Leg Wound Laterality: Left, Anterior Cleanser: Soap and Water Every Other Belinda [name]/30 Days Discharge Instructions: May shower and wash wound with dial antibacterial soap and water prior to dressing change. Cleanser: Vashe 5.8 (oz) Every Other Belinda [name]/30 Days Discharge Instructions: Cleanse the wound with Vashe prior to applying Belinda [name] clean dressing using gauze sponges, not tissue or cotton balls. Prim Dressing: Hydrofera Blue Ready Transfer Foam, 2.5x2.5 (in/in) Every Other Belinda [name]/30 Days ary Discharge Instructions: Apply directly to wound bed as directed Secondary Dressing: Woven Gauze Sponge, Non-Sterile 4x4 in Every Other Belinda [name]/30 Days Discharge Instructions: Apply over primary dressing as directed. Secured With: Insurance underwriter, Sterile 2x75 (in/in) Every Other Belinda [name]/30 Days Discharge Instructions: Secure with stretch gauze as directed. Secured With: 22M [employer] Scientist, research (life sciences) Surgical T 2x10 (in/yd) Every Other Belinda [name]/30 Days ape Discharge Instructions: Secure with tape as directed. 1. I change the primary dressing to Hydrofera Blue, gauze. She is changing the dressing herself 2. In the first week of December she will be having Belinda [name] colonoscopy. 3. May require more mechanical debridement hopefully the [facility] helps with this. 4. No evidence of surrounding infection Electronic Signature(s) Signed: 12/09/2022 4:06:32 PM By: Baltazar Najjar MD Entered By: Baltazar Najjar on 12/08/2022 08:47:43 -------------------------------------------------------------------------------- SuperBill Details Patient Name: Date of Service: Belinda Pluck RO N Belinda [name]. 12/08/2022 Medical Record Number: [id number] Patient Account Number: [id number] Date of Birth/Sex: Treating RN: [birth year] (77 y.o.  Arta Silence Primary Care Provider: Tana Conch Other Clinician: Referring Provider: Treating Provider/Extender: Jaquelyn Bitter in Treatment: 6 Diagnosis Coding ICD-10 Codes Code Description [phone] Laceration without foreign body, left lower leg, initial encounter L03.116 Cellulitis of left lower limb E11.622 Type 2 diabetes mellitus with other skin ulcer Belinda Belinda [name], Belinda Belinda [name] ([id number]) [id number].pdf Page 7 of 7 [phone] Non-pressure chronic ulcer of other part of left lower leg with fat layer exposed I10 Essential (primary) hypertension J44.89 Other specified chronic obstructive pulmonary disease Facility Procedures : CPT4 Code: [id number] Description: 11042 - DEB SUBQ TISSUE 20 SQ CM/< ICD-10 Diagnosis Description L97.822 Non-pressure chronic ulcer of other part of left lower leg with fat layer expo Modifier: sed Quantity: 1 Physician Procedures : CPT4 Code Description Modifier [id number] 11042 - WC PHYS SUBQ TISS 20 SQ CM ICD-10 Diagnosis Description L97.822 Non-pressure chronic ulcer of other part of left lower leg with fat layer exposed Quantity: 1 Electronic Signature(s) Signed: 12/09/2022 4:06:32 PM By: Baltazar Najjar MD Entered By: Baltazar Najjar on 12/08/2022 08:47:52

## 2022-12-15 ENCOUNTER — Encounter (HOSPITAL_BASED_OUTPATIENT_CLINIC_OR_DEPARTMENT_OTHER): Payer: Medicare Other | Admitting: Internal Medicine

## 2022-12-15 DIAGNOSIS — I10 Essential (primary) hypertension: Secondary | ICD-10-CM | POA: Diagnosis not present

## 2022-12-15 DIAGNOSIS — J4489 Other specified chronic obstructive pulmonary disease: Secondary | ICD-10-CM | POA: Diagnosis not present

## 2022-12-15 DIAGNOSIS — E11622 Type 2 diabetes mellitus with other skin ulcer: Secondary | ICD-10-CM | POA: Diagnosis not present

## 2022-12-15 DIAGNOSIS — S81812A Laceration without foreign body, left lower leg, initial encounter: Secondary | ICD-10-CM | POA: Diagnosis not present

## 2022-12-15 DIAGNOSIS — L03116 Cellulitis of left lower limb: Secondary | ICD-10-CM | POA: Diagnosis not present

## 2022-12-15 DIAGNOSIS — L97822 Non-pressure chronic ulcer of other part of left lower leg with fat layer exposed: Secondary | ICD-10-CM | POA: Diagnosis not present

## 2022-12-15 NOTE — Progress Notes (Signed)
KADE, DEATRICK Day ([id number]) [id number] Page 1 of 7 Visit Report for 12/15/2022 Debridement Details Patient Name: Date of Service: Belinda Day, Belinda Day. 12/15/2022 11:00 [name] Medical Record Number: [id number] Patient Account Number: [id number] Date of Birth/Sex: Treating RN: 1945-08-23 (77 y.o. F) Primary Care Provider: Tana Conch Other Clinician: Referring Provider: Treating Provider/Extender: Jaquelyn Bitter in Treatment: 7 Debridement Performed for Assessment: Wound #1 Left,Anterior Lower Leg Performed By: Physician Maxwell Caul., MD The following information was scribed by: Shawn Stall The information was scribed for: Baltazar Najjar Debridement Type: Debridement Severity of Tissue Pre Debridement: Fat layer exposed Level of Consciousness (Pre-procedure): Awake and Alert Pre-procedure Verification/Time Out Yes - 12:10 Taken: Start Time: 12:11 Pain Control: Lidocaine 4% T opical Solution Percent of Wound Bed Debrided: 100% T Area Debrided (cm): otal 2.83 Tissue and other material debrided: Non-Viable, Slough, Subcutaneous, Skin: Dermis , Slough Level: Skin/Subcutaneous Tissue Debridement Description: Excisional Instrument: Curette Bleeding: Minimum Hemostasis Achieved: Pressure End Time: 12:17 Procedural Pain: 0 Post Procedural Pain: 0 Response to Treatment: Procedure was tolerated well Level of Consciousness (Post- Awake and Alert procedure): Post Debridement Measurements of Total Wound Length: (cm) 1.8 Width: (cm) 2 Depth: (cm) 0.1 Volume: (cm) 0.283 Character of Wound/Ulcer Post Debridement: Improved Severity of Tissue Post Debridement: Fat layer exposed Post Procedure Diagnosis Same as Pre-procedure Electronic Signature(s) Signed: 12/15/2022 4:37:54 PM By: Baltazar Najjar MD Entered By: Baltazar Najjar on 12/15/2022  09:40:55 -------------------------------------------------------------------------------- HPI Details Patient Name: Date of Service: Belinda Pluck RO N Day. 12/15/2022 11:00 [name] Medical Record Number: [id number] Patient Account Number: [id number] Date of Birth/Sex: Treating RN: 04/05/1945 (77 y.o. F) Primary Care Provider: Tana Conch Other Clinician: Referring Provider: Treating Provider/Extender: Jaquelyn Bitter in Treatment: [address] [place] Day ([id number]) [id number] Page 2 of 7 History of Present Illness HPI Description: 10/27/2022 upon evaluation today patient presents for initial inspection here in the clinic regarding Day wound over the left anterior lower extremity. T back up Day little bit and gives some history I am going to recount what the patient brought in written out on her sheet with her today which is an o excellent history of the course of this wound. On August 3 the patient sustained an injury to the left leg due to Day fall off of Day ladder. This was essentially Day deep cut and continue to bleed for about 4 days. On August 7 she saw Dr. Lenn Cal at [facility] and was started on Keflex at that point. Subsequently on August 14 she went back to [facility] where she was placed on doxycycline 100 mg for 2 rounds. The next time that she was evaluated was actually about 3 weeks later when back at [facility] September 6 she was sent to the hospital due to what appeared to be worsening and started on vancomycin IV. September 7 she was actually discharged from the hospital with linezolid 600 mg. On September 18 she saw Dr. Anselm Jungling who recommended the patient go to [facility] long the next morning. On September 19 she saw infectious disease and they ordered an MRI as well as putting the patient on Dalvance. This was by IV. The next appointment with infectious disease was September 26 at that point it was noted  that the patient had Day number MRI and the results of that were reported out on 10-14-2022 which showed that she had soft tissue swelling of the distal lower leg with focal skin thickening and enhancement  anteriorly consistent with cellulitis no abscess. There was also Day thin periosteal edema and enhancement along the anterior medial aspect of the distal tibia this is stated to be likely reactive no definitive osteomyelitis noted. Subsequently she states that she did follow-up requesting to go to the wound center for further evaluation and was referred to [place] today October 2 for evaluation in the meantime she has been using Silvadene cream. Her most recent hemoglobin A1c was 6.6 she does have diabetes but otherwise no major medical problems other than hypertension and COPD that would affect her ability to heal. 11-03-2022 upon evaluation today patient appears to be doing okay currently in regard to her wound. I did actually perform Day biopsy last week and came back positive for squamous cell carcinoma. With that being said I discussed with the patient that she is likely can require Day full excision of this area and I recommended that she may need to go to the skin surgery center unless we get her in with her dermatologist sooner. I am not sure if they have Day Mohs surgeon at [facility] dermatology or not. With that being said we will get Day see about get in touch with them and finding out which I think will definitely be of benefit if so because it could be something when she gets in much faster. 11-17-2022 upon evaluation today patient presents for follow-up I last saw her on October 9 since then she actually have the mobile skin surgery on November 15, 2022 that she has been 2 days ago. This actually went very well the area that they had to remove was not nearly as large as I was fearing this is also and excellent. With that being said I do believe the patient is actually making progress here already towards  seeing this improvement although again it is much too early to tell that it is actually healing currently. There is Day lot of bleeding we want Day make sure that this does not turn into just Day blood clot or harder scabs on the use Xeroform gauze at this point helps keep this soft over the next week we will see where things stand following. I am very pleased however they were able to get her in so clinically that is awesome and I do appreciate that. 11-24-2022 upon evaluation patient's wound is showing signs of improvement this does seem to be improving as far as the overall appearance of the wound is concerned. I feel like that it is slowly clearing up to Day good surface underneath were not quite there yet but at the same time I think Xeroform is doing Day good job. 11/6; Day wound that was initially felt to be traumatic however Day biopsy of part of this showed squamous cell carcinoma. She underwent Mohs surgery on October 21. We were seeing her for the postop wound care. Apparently the margins of this were clear. She has been using Xeroform and ZZetuvit 11/13; Mohs surgery site. Xeroform and gauze. Comes in the clinic with Day nonviable surface. 11/20; Mohs surgery sites. I changed her last week to [facility] and gauze. We have been trying to order her compression stockings but running into trouble with [place] [employer]) Signed: 12/15/2022 4:37:54 PM By: Baltazar Najjar MD Entered By: Baltazar Najjar on 12/15/2022 09:42:05 -------------------------------------------------------------------------------- Physical Exam Details Patient Name: Date of Service: Belinda Pluck RO N Day. 12/15/2022 11:00 [name] Medical Record Number: [id number] Patient Account Number: [id number] Date of Birth/Sex: Treating RN: Sep 28, 1945 (77  y.o. F) Primary Care Provider: Tana Conch Other Clinician: Referring Provider: Treating Provider/Extender: Jaquelyn Bitter in  Treatment: 7 Constitutional Sitting or standing Blood Pressure is within target range for patient.. Pulse regular and within target range for patient.Marland Kitchen Respirations regular, non-labored and within target range.. Temperature is normal and within the target range for the patient.Marland Kitchen Appears in no distress. Notes Wound exam; left anterior lower leg. Again Day fibrinous nonviable surface. I used Day #3 curette to debride this once again although she does not tolerate this well. Day single silver nitrate for homeostasis. Electronic Signature(s) Signed: 12/15/2022 4:37:54 PM By: Baltazar Najjar MD Entered By: Baltazar Najjar on 12/15/2022 09:43:17 Belinda Day ([id number]) [id number] Page 3 of 7 -------------------------------------------------------------------------------- Physician Orders Details Patient Name: Date of Service: Belinda Day, Belinda Day. 12/15/2022 11:00 [name] Medical Record Number: [id number] Patient Account Number: [id number] Date of Birth/Sex: Treating RN: 06/09/1945 (77 y.o. Debara Pickett, Millard.Loa Primary Care Provider: Tana Conch Other Clinician: Referring Provider: Treating Provider/Extender: Jaquelyn Bitter in Treatment: 7 The following information was scribed by: Shawn Stall The information was scribed for: Baltazar Najjar Verbal / Phone Orders: No Diagnosis Coding ICD-10 Coding Code Description S81.812A Laceration without foreign body, left lower leg, initial encounter L03.116 Cellulitis of left lower limb E11.622 Type 2 diabetes mellitus with other skin ulcer L97.822 Non-pressure chronic ulcer of other part of left lower leg with fat layer exposed I10 Essential (primary) hypertension J44.89 Other specified chronic obstructive pulmonary disease Follow-up Appointments Return appointment in 3 weeks. - ****Skip Thanksgiving week**** Per patient has to skip the first week of December due to her tests with Gastroenterology- Per  patient may return on or after 01/05/2023.****Front office to schedule***** Anesthetic (In clinic) Topical Lidocaine 5% applied to wound bed Bathing/ Shower/ Hygiene May shower and wash wound with soap and water. - Keep bandage on when in shower, at the end of shower remove dressing and wash with dial Antibacterial soap. After shower, gentle pat dry and apply Xeroform and form border dressing Additional Orders / Instructions Other: - Punch Biopsy sent to [facility] labs. (10/27/22)- Done Wound Treatment Wound #1 - Lower Leg Wound Laterality: Left, Anterior Cleanser: Soap and Water Every Other Day/30 Days Discharge Instructions: May shower and wash wound with dial antibacterial soap and water prior to dressing change. Cleanser: Vashe 5.8 (oz) Every Other Day/30 Days Discharge Instructions: Cleanse the wound with Vashe prior to applying Day clean dressing using gauze sponges, not tissue or cotton balls. Prim Dressing: Hydrofera Blue Ready Transfer Foam, 2.5x2.5 (in/in) Every Other Day/30 Days ary Discharge Instructions: Apply directly to wound bed as directed Secondary Dressing: Woven Gauze Sponge, Non-Sterile 4x4 in Every Other Day/30 Days Discharge Instructions: Apply over primary dressing as directed. Secured With: Insurance underwriter, Sterile 2x75 (in/in) Every Other Day/30 Days Discharge Instructions: Secure with stretch gauze as directed. Secured With: 56M [employer] Scientist, research (life sciences) Surgical T 2x10 (in/yd) Every Other Day/30 Days ape Discharge Instructions: Secure with tape as directed. Electronic Signature(s) Signed: 12/15/2022 4:36:51 PM By: Shawn Stall RN, BSN Signed: 12/15/2022 4:37:54 PM By: Baltazar Najjar MD Entered By: Shawn Stall on 12/15/2022 09:16:04 Belinda Day ([id number]) [id number] Page 4 of 7 -------------------------------------------------------------------------------- Problem List Details Patient Name: Date of Service: Belinda Day, Belinda Day. 12/15/2022 11:00 [name] Medical Record Number: [id number] Patient Account Number: [id number] Date of Birth/Sex: Treating RN: [birth year] (77 y.o. Arta Silence Primary Care Provider: Tana Conch Other Clinician: Referring Provider: Treating Provider/Extender:  Jaquelyn Bitter in Treatment: 7 Active Problems ICD-10 Encounter Code Description Active Date MDM Diagnosis [phone] Laceration without foreign body, left lower leg, initial encounter 10/27/2022 No Yes L03.116 Cellulitis of left lower limb 10/27/2022 No Yes E11.622 Type 2 diabetes mellitus with other skin ulcer 10/27/2022 No Yes L97.822 Non-pressure chronic ulcer of other part of left lower leg with fat layer exposed10/02/2022 No Yes I10 Essential (primary) hypertension 10/27/2022 No Yes J44.89 Other specified chronic obstructive pulmonary disease 10/27/2022 No Yes Inactive Problems Resolved Problems Electronic Signature(s) Signed: 12/15/2022 4:37:54 PM By: Baltazar Najjar MD Entered By: Baltazar Najjar on 12/15/2022 09:38:40 -------------------------------------------------------------------------------- Progress Note Details Patient Name: Date of Service: Belinda Pluck RO N Day. 12/15/2022 11:00 [name] Medical Record Number: [id number] Patient Account Number: [id number] Date of Birth/Sex: Treating RN: 1945-03-04 (77 y.o. F) Primary Care Provider: Tana Conch Other Clinician: Referring Provider: Treating Provider/Extender: Jaquelyn Bitter in Treatment: 7 Subjective History of Present Illness (HPI) 10/27/2022 upon evaluation today patient presents for initial inspection here in the clinic regarding Day wound over the left anterior lower extremity. T back up Day o Belinda Day, Belinda Day ([id number]) [id number] Page 5 of 7 little bit and gives some history I am going to recount what the patient brought in written out on her sheet with her today which is an  excellent history of the course of this wound. On August 3 the patient sustained an injury to the left leg due to Day fall off of Day ladder. This was essentially Day deep cut and continue to bleed for about 4 days. On August 7 she saw Dr. Lenn Cal at [facility] and was started on Keflex at that point. Subsequently on August 14 she went back to [facility] where she was placed on doxycycline 100 mg for 2 rounds. The next time that she was evaluated was actually about 3 weeks later when back at [facility] September 6 she was sent to the hospital due to what appeared to be worsening and started on vancomycin IV. September 7 she was actually discharged from the hospital with linezolid 600 mg. On September 18 she saw Dr. Anselm Jungling who recommended the patient go to [facility] long the next morning. On September 19 she saw infectious disease and they ordered an MRI as well as putting the patient on Dalvance. This was by IV. The next appointment with infectious disease was September 26 at that point it was noted that the patient had Day number MRI and the results of that were reported out on 10-14-2022 which showed that she had soft tissue swelling of the distal lower leg with focal skin thickening and enhancement anteriorly consistent with cellulitis no abscess. There was also Day thin periosteal edema and enhancement along the anterior medial aspect of the distal tibia this is stated to be likely reactive no definitive osteomyelitis noted. Subsequently she states that she did follow-up requesting to go to the wound center for further evaluation and was referred to [place] today October 2 for evaluation in the meantime she has been using Silvadene cream. Her most recent hemoglobin A1c was 6.6 she does have diabetes but otherwise no major medical problems other than hypertension and COPD that would affect her ability to heal. 11-03-2022 upon evaluation today patient appears to be doing okay  currently in regard to her wound. I did actually perform Day biopsy last week and came back positive for squamous cell carcinoma. With that being said I discussed with the  patient that she is likely can require Day full excision of this area and I recommended that she may need to go to the skin surgery center unless we get her in with her dermatologist sooner. I am not sure if they have Day Mohs surgeon at [facility] dermatology or not. With that being said we will get Day see about get in touch with them and finding out which I think will definitely be of benefit if so because it could be something when she gets in much faster. 11-17-2022 upon evaluation today patient presents for follow-up I last saw her on October 9 since then she actually have the mobile skin surgery on November 15, 2022 that she has been 2 days ago. This actually went very well the area that they had to remove was not nearly as large as I was fearing this is also and excellent. With that being said I do believe the patient is actually making progress here already towards seeing this improvement although again it is much too early to tell that it is actually healing currently. There is Day lot of bleeding we want Day make sure that this does not turn into just Day blood clot or harder scabs on the use Xeroform gauze at this point helps keep this soft over the next week we will see where things stand following. I am very pleased however they were able to get her in so clinically that is awesome and I do appreciate that. 11-24-2022 upon evaluation patient's wound is showing signs of improvement this does seem to be improving as far as the overall appearance of the wound is concerned. I feel like that it is slowly clearing up to Day good surface underneath were not quite there yet but at the same time I think Xeroform is doing Day good job. 11/6; Day wound that was initially felt to be traumatic however Day biopsy of part of this showed squamous cell  carcinoma. She underwent Mohs surgery on October 21. We were seeing her for the postop wound care. Apparently the margins of this were clear. She has been using Xeroform and ZZetuvit 11/13; Mohs surgery site. Xeroform and gauze. Comes in the clinic with Day nonviable surface. 11/20; Mohs surgery sites. I changed her last week to [facility] and gauze. We have been trying to order her compression stockings but running into trouble with [place] [employer] Objective Constitutional Sitting or standing Blood Pressure is within target range for patient.. Pulse regular and within target range for patient.Marland Kitchen Respirations regular, non-labored and within target range.. Temperature is normal and within the target range for the patient.Marland Kitchen Appears in no distress. Vitals Time Taken: 11:43 AM, Height: 61 in, Weight: 113 lbs, BMI: 21.3, Temperature: 98.2 F, Pulse: 77 bpm, Respiratory Rate: 20 breaths/min, Blood Pressure: 146/72 mmHg. General Notes: Wound exam; left anterior lower leg. Again Day fibrinous nonviable surface. I used Day #3 curette to debride this once again although she does not tolerate this well. Day single silver nitrate for homeostasis. Integumentary (Hair, Skin) Wound #1 status is Open. Original cause of wound was Skin T ear/Laceration. The date acquired was: 08/28/2022. The wound has been in treatment 7 weeks. The wound is located on the Left,Anterior Lower Leg. The wound measures 1.8cm length x 2cm width x 0.1cm depth; 2.827cm^2 area and 0.283cm^3 volume. There is Fat Layer (Subcutaneous Tissue) exposed. There is no tunneling or undermining noted. There is Day small amount of serosanguineous drainage noted. The wound margin is distinct with the  outline attached to the wound base. There is large (67-100%) red granulation within the wound bed. There is Day small (1-33%) amount of necrotic tissue within the wound bed including Adherent Slough. The periwound skin appearance did not exhibit: Callus,  Crepitus, Excoriation, Induration, Rash, Scarring, Dry/Scaly, Maceration, Atrophie Blanche, Cyanosis, Ecchymosis, Hemosiderin Staining, Mottled, Pallor, Rubor, Erythema. Periwound temperature was noted as No Abnormality. Assessment Active Problems ICD-10 Laceration without foreign body, left lower leg, initial encounter Cellulitis of left lower limb Type 2 diabetes mellitus with other skin ulcer Non-pressure chronic ulcer of other part of left lower leg with fat layer exposed Essential (primary) hypertension Other specified chronic obstructive pulmonary disease Belinda Day, Belinda Day ([id number]) [id number] Page 6 of 7 Procedures Wound #1 Pre-procedure diagnosis of Wound #1 is Day Cellulitis located on the Left,Anterior Lower Leg .Severity of Tissue Pre Debridement is: Fat layer exposed. There was Day Excisional Skin/Subcutaneous Tissue Debridement with Day total area of 2.83 sq cm performed by Maxwell Caul., MD. With the following instrument(s): Curette to remove Non-Viable tissue/material. Material removed includes Subcutaneous Tissue, Slough, and Skin: Dermis after achieving pain control using Lidocaine 4% T opical Solution. Day time out was conducted at 12:10, prior to the start of the procedure. Day Minimum amount of bleeding was controlled with Pressure. The procedure was tolerated well with Day pain level of 0 throughout and Day pain level of 0 following the procedure. Post Debridement Measurements: 1.8cm length x 2cm width x 0.1cm depth; 0.283cm^3 volume. Character of Wound/Ulcer Post Debridement is improved. Severity of Tissue Post Debridement is: Fat layer exposed. Post procedure Diagnosis Wound #1: Same as Pre-Procedure Plan Follow-up Appointments: Return appointment in 3 weeks. - ****Skip Thanksgiving week**** Per patient has to skip the first week of December due to her tests with Gastroenterology- Per patient may return on or after 01/05/2023.****Front office to  schedule***** Anesthetic: (In clinic) Topical Lidocaine 5% applied to wound bed Bathing/ Shower/ Hygiene: May shower and wash wound with soap and water. - Keep bandage on when in shower, at the end of shower remove dressing and wash with dial Antibacterial soap. After shower, gentle pat dry and apply Xeroform and form border dressing Additional Orders / Instructions: Other: - Punch Biopsy sent to [facility] labs. (10/27/22)- Done WOUND #1: - Lower Leg Wound Laterality: Left, Anterior Cleanser: Soap and Water Every Other Day/30 Days Discharge Instructions: May shower and wash wound with dial antibacterial soap and water prior to dressing change. Cleanser: Vashe 5.8 (oz) Every Other Day/30 Days Discharge Instructions: Cleanse the wound with Vashe prior to applying Day clean dressing using gauze sponges, not tissue or cotton balls. Prim Dressing: Hydrofera Blue Ready Transfer Foam, 2.5x2.5 (in/in) Every Other Day/30 Days ary Discharge Instructions: Apply directly to wound bed as directed Secondary Dressing: Woven Gauze Sponge, Non-Sterile 4x4 in Every Other Day/30 Days Discharge Instructions: Apply over primary dressing as directed. Secured With: Insurance underwriter, Sterile 2x75 (in/in) Every Other Day/30 Days Discharge Instructions: Secure with stretch gauze as directed. Secured With: 19M [employer] Scientist, research (life sciences) Surgical T 2x10 (in/yd) Every Other Day/30 Days ape Discharge Instructions: Secure with tape as directed. 1. Continue with Hydrofera Blue, kerlix and Coban locally 2. Not much in the way of swelling and no evidence of infection Electronic Signature(s) Signed: 12/15/2022 4:37:54 PM By: Baltazar Najjar MD Entered By: Baltazar Najjar on 12/15/2022 09:45:33 -------------------------------------------------------------------------------- SuperBill Details Patient Name: Date of Service: Belinda Pluck RO N Day. 12/15/2022 Medical Record Number: [id number] Patient Account Number:  [id number] Date of  Birth/Sex: Treating RN: November 04, 1945 (77 y.o. Arta Silence Primary Care Provider: Tana Conch Other Clinician: Referring Provider: Treating Provider/Extender: Jaquelyn Bitter in Treatment: 7 Diagnosis Coding ICD-10 Codes Code Description [phone] Laceration without foreign body, left lower leg, initial encounter L03.116 Cellulitis of left lower limb E11.622 Type 2 diabetes mellitus with other skin ulcer Belinda Day, Belinda Day ([id number]) [phone].pdf Page 7 of 7 [phone] Non-pressure chronic ulcer of other part of left lower leg with fat layer exposed I10 Essential (primary) hypertension J44.89 Other specified chronic obstructive pulmonary disease Facility Procedures : CPT4 Code: 71245809 Description: 11042 - DEB SUBQ TISSUE 20 SQ CM/< ICD-10 Diagnosis Description L97.822 Non-pressure chronic ulcer of other part of left lower leg with fat layer expo Modifier: sed Quantity: 1 Physician Procedures : CPT4 Code Description Modifier [id number] 11042 - WC PHYS SUBQ TISS 20 SQ CM ICD-10 Diagnosis Description L97.822 Non-pressure chronic ulcer of other part of left lower leg with fat layer exposed Quantity: 1 Electronic Signature(s) Signed: 12/15/2022 4:37:54 PM By: Baltazar Najjar MD Entered By: Baltazar Najjar on 12/15/2022 09:45:43

## 2022-12-15 NOTE — Progress Notes (Signed)
JAHNI, MEDCALF A ([id number]) [id number] Page 1 of 8 Visit Report for 12/15/2022 Arrival Information Details Patient Name: Date of Service: Belinda Day, Belinda A. 12/15/2022 11:00 A M Medical Record Number: [id number] Patient Account Number: [id number] Date of Birth/Sex: Treating RN: 1945-05-12 (77 y.o. Arta Silence Primary Care Kiri Hinderliter: Tana Conch Other Clinician: Referring Savahna Casados: Treating Kaizley Aja/Extender: Jaquelyn Bitter in Treatment: 7 Visit Information History Since Last Visit Added or deleted any medications: No Patient Arrived: Ambulatory Any new allergies or adverse reactions: No Arrival Time: 11:41 Had a fall or experienced change in No Accompanied By: self activities of daily living that may affect Transfer Assistance: None risk of falls: Patient Identification Verified: Yes Signs or symptoms of abuse/neglect since last visito No Secondary Verification Process Completed: Yes Hospitalized since last visit: No Patient Requires Transmission-Based Precautions: No Implantable device outside of the clinic excluding No Patient Has Alerts: Yes cellular tissue based products placed in the center Patient Alerts: Patient on Blood Thinner since last visit: aspirin 81mg  Has Dressing in Place as Prescribed: Yes No ABI- Very painful. Pain Present Now: No Electronic Signature(s) Signed: 12/15/2022 4:36:51 PM By: Shawn Stall RN, BSN Entered By: Shawn Stall on 12/15/2022 11:42:28 -------------------------------------------------------------------------------- Encounter Discharge Information Details Patient Name: Date of Service: Belinda Pluck RO N A. 12/15/2022 11:00 A M Medical Record Number: [id number] Patient Account Number: [id number] Date of Birth/Sex: Treating RN: 02/18/1945 (77 y.o. Arta Silence Primary Care Skyelar Swigart: Tana Conch Other Clinician: Referring Rayhana Slider: Treating Thaddius Manes/Extender: Jaquelyn Bitter in Treatment: 7 Encounter Discharge Information Items Post Procedure Vitals Discharge Condition: Stable Temperature (F): 98.2 Ambulatory Status: Ambulatory Pulse (bpm): 77 Discharge Destination: Home Respiratory Rate (breaths/min): 20 Transportation: Private Auto Blood Pressure (mmHg): 146/72 Accompanied By: self Schedule Follow-up Appointment: Yes Clinical Summary of Care: Electronic Signature(s) Signed: 12/15/2022 4:36:51 PM By: Shawn Stall RN, BSN Entered By: Shawn Stall on 12/15/2022 12:18:26 Brayton Layman A ([id number]) [id number] Page 2 of 8 -------------------------------------------------------------------------------- Lower Extremity Assessment Details Patient Name: Date of Service: Belinda Day, Belinda A. 12/15/2022 11:00 A M Medical Record Number: [id number] Patient Account Number: [id number] Date of Birth/Sex: Treating RN: [birth year] (77 y.o. Arta Silence Primary Care Davyon Fisch: Tana Conch Other Clinician: Referring Tobi Leinweber: Treating Larin Depaoli/Extender: Jaquelyn Bitter in Treatment: 7 Edema Assessment Assessed: Kyra Searles: Yes] Franne Forts: No] Edema: [Left: N] [Right: o] Calf Left: Right: Point of Measurement: From Medial Instep 31 cm Ankle Left: Right: Point of Measurement: From Medial Instep 19 cm Vascular Assessment Pulses: Dorsalis Pedis Palpable: [Left:Yes] Extremity colors, hair growth, and conditions: Extremity Color: [Left:Normal] Hair Growth on Extremity: [Left:Yes] Temperature of Extremity: [Left:Warm] Capillary Refill: [Left:< 3 seconds] Dependent Rubor: [Left:No] Blanched when Elevated: [Left:No No] Toe Nail Assessment Left: Right: Thick: No Discolored: No Deformed: No Improper Length and Hygiene: No Electronic Signature(s) Signed: 12/15/2022 4:36:51 PM By: Shawn Stall RN, BSN Entered By: Shawn Stall on 12/15/2022  11:43:49 -------------------------------------------------------------------------------- Multi Wound Chart Details Patient Name: Date of Service: Belinda Pluck RO N A. 12/15/2022 11:00 A M Medical Record Number: [id number] Patient Account Number: [id number] Date of Birth/Sex: Treating RN: 09-23-1945 (77 y.o. F) Primary Care Lyna Laningham: Tana Conch Other Clinician: Referring Madisson Kulaga: Treating Emmylou Bieker/Extender: Jaquelyn Bitter in Treatment: 7 Vital Signs Height(in): 61 Pulse(bpm): 77 Weight(lbs): 113 Blood Pressure(mmHg): 146/72 JHERI, ROURKE A ([id number]) [phone].pdf Page 3 of 8 Body Mass Index(BMI): 21.3 Temperature(F): 98.2 Respiratory Rate(breaths/min): 20 [1:Photos:] [N/A:N/A] Left, Anterior Lower Leg N/A N/A Wound Location: Skin T ear/Laceration N/A  N/A Wounding Event: Cellulitis N/A N/A Primary Etiology: Diabetic Wound/Ulcer of the Lower N/A N/A Secondary Etiology: Extremity Chronic Obstructive Pulmonary N/A N/A Comorbid History: Disease (COPD), Coronary Artery Disease, Hypertension, Type II Diabetes 08/28/2022 N/A N/A Date Acquired: 7 N/A N/A Weeks of Treatment: Open N/A N/A Wound Status: No N/A N/A Wound Recurrence: 1.8x2x0.1 N/A N/A Measurements L x W x D (cm) 2.827 N/A N/A A (cm) : rea 0.283 N/A N/A Volume (cm) : -260.10% N/A N/A % Reduction in A rea: -258.20% N/A N/A % Reduction in Volume: Full Thickness Without Exposed N/A N/A Classification: Support Structures Small N/A N/A Exudate A mount: Serosanguineous N/A N/A Exudate Type: red, brown N/A N/A Exudate Color: Distinct, outline attached N/A N/A Wound Margin: Large (67-100%) N/A N/A Granulation A mount: Red N/A N/A Granulation Quality: Small (1-33%) N/A N/A Necrotic A mount: Fat Layer (Subcutaneous Tissue): Yes N/A N/A Exposed Structures: Fascia: No Tendon: No Muscle: No Joint: No Bone: No Small (1-33%) N/A  N/A Epithelialization: Debridement - Excisional N/A N/A Debridement: Pre-procedure Verification/Time Out 12:10 N/A N/A Taken: Lidocaine 4% Topical Solution N/A N/A Pain Control: Subcutaneous, Slough N/A N/A Tissue Debrided: Skin/Subcutaneous Tissue N/A N/A Level: 2.83 N/A N/A Debridement A (sq cm): rea Curette N/A N/A Instrument: Minimum N/A N/A Bleeding: Pressure N/A N/A Hemostasis A chieved: 0 N/A N/A Procedural Pain: 0 N/A N/A Post Procedural Pain: Procedure was tolerated well N/A N/A Debridement Treatment Response: 1.8x2x0.1 N/A N/A Post Debridement Measurements L x W x D (cm) 0.283 N/A N/A Post Debridement Volume: (cm) Excoriation: No N/A N/A Periwound Skin Texture: Induration: No Callus: No Crepitus: No Rash: No Scarring: No Maceration: No N/A N/A Periwound Skin Moisture: Dry/Scaly: No Atrophie Blanche: No N/A N/A Periwound Skin Color: Cyanosis: No Ecchymosis: No Erythema: No Hemosiderin Staining: No Mottled: No Pallor: No Rubor: No No Abnormality N/A N/A Temperature: Debridement N/A N/A Procedures Performed: OLETTA, MICHELINI A ([id number]) [phone].pdf Page 4 of 8 Treatment Notes Wound #1 (Lower Leg) Wound Laterality: Left, Anterior Cleanser Soap and Water Discharge Instruction: May shower and wash wound with dial antibacterial soap and water prior to dressing change. Vashe 5.8 (oz) Discharge Instruction: Cleanse the wound with Vashe prior to applying a clean dressing using gauze sponges, not tissue or cotton balls. Peri-Wound Care Topical Primary Dressing Hydrofera Blue Ready Transfer Foam, 2.5x2.5 (in/in) Discharge Instruction: Apply directly to wound bed as directed Secondary Dressing Woven Gauze Sponge, Non-Sterile 4x4 in Discharge Instruction: Apply over primary dressing as directed. Secured With Conforming Stretch Gauze Bandage, Sterile 2x75 (in/in) Discharge Instruction: Secure with stretch gauze as  directed. 55M Medipore Soft Cloth Surgical T 2x10 (in/yd) ape Discharge Instruction: Secure with tape as directed. Compression Wrap Compression Stockings Add-Ons Electronic Signature(s) Signed: 12/15/2022 4:37:54 PM By: Baltazar Najjar MD Entered By: Baltazar Najjar on 12/15/2022 12:40:35 -------------------------------------------------------------------------------- Multi-Disciplinary Care Plan Details Patient Name: Date of Service: Belinda Pluck RO N A. 12/15/2022 11:00 A M Medical Record Number: [id number] Patient Account Number: [id number] Date of Birth/Sex: Treating RN: [birth year] (77 y.o. Arta Silence Primary Care Izek Corvino: Tana Conch Other Clinician: Referring Carmell Elgin: Treating Lori Popowski/Extender: Jaquelyn Bitter in Treatment: 7 Active Inactive Wound/Skin Impairment Nursing Diagnoses: Impaired tissue integrity Goals: Patient/caregiver will verbalize understanding of skin care regimen Date Initiated: 10/27/2022 Target Resolution Date: 01/25/2023 Goal Status: Active Interventions: Assess ulceration(s) every visit Treatment Activities: Skin care regimen initiated : 10/27/2022 TZIVIA, LOIBL A ([id number]) [phone].pdf Page 5 of 8 Notes: Electronic Signature(s) Signed: 12/15/2022 4:36:51 PM By: Shawn Stall RN, BSN Entered By:  Shawn Stall on 12/15/2022 11:49:20 -------------------------------------------------------------------------------- Pain Assessment Details Patient Name: Date of Service: Belinda Day, Belinda A. 12/15/2022 11:00 A M Medical Record Number: [id number] Patient Account Number: [id number] Date of Birth/Sex: Treating RN: 1945/03/21 (77 y.o. Arta Silence Primary Care Meggen Spaziani: Tana Conch Other Clinician: Referring Devinn Voshell: Treating Cayce Quezada/Extender: Jaquelyn Bitter in Treatment: 7 Active Problems Location of Pain Severity and Description of Pain Patient Has Paino  No Site Locations Pain Management and Medication Current Pain Management: Electronic Signature(s) Signed: 12/15/2022 4:36:51 PM By: Shawn Stall RN, BSN Entered By: Shawn Stall on 12/15/2022 11:42:36 -------------------------------------------------------------------------------- Patient/Caregiver Education Details Patient Name: Date of Service: Belinda Day 11/20/2024andnbsp11:00 A M Medical Record Number: [id number] Patient Account Number: [id number] Date of Birth/Gender: Treating RN: 12/19/1945 (77 y.o. Arta Silence Primary Care Physician: Tana Conch Other Clinician: Referring Physician: Treating Physician/Extender: Jaquelyn Bitter in Treatment: 7 Education Assessment AVIANNAH, BOY A ([id number]) [id number] Page 6 of 8 Education Provided To: Patient Education Topics Provided Wound/Skin Impairment: Handouts: Caring for Your Ulcer Methods: Explain/Verbal Responses: Reinforcements needed Electronic Signature(s) Signed: 12/15/2022 4:36:51 PM By: Shawn Stall RN, BSN Entered By: Shawn Stall on 12/15/2022 11:49:31 -------------------------------------------------------------------------------- Wound Assessment Details Patient Name: Date of Service: Belinda Pluck RO N A. 12/15/2022 11:00 A M Medical Record Number: [id number] Patient Account Number: [id number] Date of Birth/Sex: Treating RN: [birth year] (77 y.o. Debara Pickett, Millard.Loa Primary Care Massai Hankerson: Tana Conch Other Clinician: Referring Tricia Pledger: Treating Devone Tousley/Extender: Jaquelyn Bitter in Treatment: 7 Wound Status Wound Number: 1 Primary Cellulitis Etiology: Wound Location: Left, Anterior Lower Leg Secondary Diabetic Wound/Ulcer of the Lower Extremity Wounding Event: Skin Tear/Laceration Etiology: Date Acquired: 08/28/2022 Wound Open Weeks Of Treatment: 7 Status: Clustered Wound: No Comorbid Chronic Obstructive Pulmonary  Disease (COPD), Coronary History: Artery Disease, Hypertension, Type II Diabetes Photos Wound Measurements Length: (cm) 1.8 Width: (cm) 2 Depth: (cm) 0.1 Area: (cm) 2.827 Volume: (cm) 0.283 % Reduction in Area: -260.1% % Reduction in Volume: -258.2% Epithelialization: Small (1-33%) Tunneling: No Undermining: No Wound Description Classification: Full Thickness Without Exposed Suppor Wound Margin: Distinct, outline attached Exudate Amount: Small Exudate Type: Serosanguineous Exudate Color: red, brown t Structures Foul Odor After Cleansing: No Slough/Fibrino Yes Wound Bed Granulation Amount: Large (67-100%) Exposed Structure Granulation Quality: Red Fascia Exposed: No Necrotic Amount: Small (1-33%) Fat Layer (Subcutaneous Tissue) Exposed: Yes Ridolfi, Jannely A ([id number]) [id number] Page 7 of 8 Necrotic Quality: Adherent Slough Tendon Exposed: No Muscle Exposed: No Joint Exposed: No Bone Exposed: No Periwound Skin Texture Texture Color No Abnormalities Noted: No No Abnormalities Noted: No Callus: No Atrophie Blanche: No Crepitus: No Cyanosis: No Excoriation: No Ecchymosis: No Induration: No Erythema: No Rash: No Hemosiderin Staining: No Scarring: No Mottled: No Pallor: No Moisture Rubor: No No Abnormalities Noted: No Dry / Scaly: No Temperature / Pain Maceration: No Temperature: No Abnormality Treatment Notes Wound #1 (Lower Leg) Wound Laterality: Left, Anterior Cleanser Soap and Water Discharge Instruction: May shower and wash wound with dial antibacterial soap and water prior to dressing change. Vashe 5.8 (oz) Discharge Instruction: Cleanse the wound with Vashe prior to applying a clean dressing using gauze sponges, not tissue or cotton balls. Peri-Wound Care Topical Primary Dressing Hydrofera Blue Ready Transfer Foam, 2.5x2.5 (in/in) Discharge Instruction: Apply directly to wound bed as directed Secondary Dressing Woven  Gauze Sponge, Non-Sterile 4x4 in Discharge Instruction: Apply over primary dressing as directed. Secured With Conforming Stretch Gauze Bandage, Sterile 2x75 (in/in) Discharge Instruction: Secure with stretch gauze  as directed. 27M Medipore Soft Cloth Surgical T 2x10 (in/yd) ape Discharge Instruction: Secure with tape as directed. Compression Wrap Compression Stockings Add-Ons Electronic Signature(s) Signed: 12/15/2022 4:36:51 PM By: Shawn Stall RN, BSN Entered By: Shawn Stall on 12/15/2022 11:47:23 -------------------------------------------------------------------------------- Vitals Details Patient Name: Date of Service: Belinda Pluck RO N A. 12/15/2022 11:00 A M Medical Record Number: [id number] Patient Account Number: [id number] Date of Birth/Sex: Treating RN: October 10, 1945 (77 y.o. Arta Silence Primary Care Alin Chavira: Tana Conch Other Clinician: Referring Delmas Faucett: Treating Rami Waddle/Extender: Jaquelyn Bitter in Treatment: 7 Vital Signs JACIANA, CLEMON A ([id number]) [id number] Page 8 of 8 Time Taken: 11:43 Temperature (F): 98.2 Height (in): 61 Pulse (bpm): 77 Weight (lbs): 113 Respiratory Rate (breaths/min): 20 Body Mass Index (BMI): 21.3 Blood Pressure (mmHg): 146/72 Reference Range: 80 - 120 mg / dl Electronic Signature(s) Signed: 12/15/2022 4:36:51 PM By: Shawn Stall RN, BSN Entered By: Shawn Stall on 12/15/2022 11:43:08

## 2022-12-17 ENCOUNTER — Encounter: Payer: Self-pay | Admitting: Family Medicine

## 2022-12-20 ENCOUNTER — Other Ambulatory Visit: Payer: Self-pay | Admitting: Family Medicine

## 2022-12-27 ENCOUNTER — Encounter (HOSPITAL_COMMUNITY): Payer: Self-pay | Admitting: Gastroenterology

## 2022-12-27 NOTE — Progress Notes (Signed)
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 Last saw cardiology 03/10/22 f/u 1 yr, pt reports no new issues. Pt did call in to cards 9/9 for elevated BP, they placed her on Norvasc, she does feel like that has helped, usually BP around 130s-140s/70-80s. Also sees pulm, last visit 09/08/22, reports no new breathing issues, will bring inhaler, no use of 02. Anesthesia Review: Yes

## 2022-12-30 ENCOUNTER — Ambulatory Visit: Payer: Medicare Other | Admitting: Family Medicine

## 2023-01-03 ENCOUNTER — Other Ambulatory Visit: Payer: Self-pay

## 2023-01-03 ENCOUNTER — Encounter (HOSPITAL_COMMUNITY): Payer: Self-pay | Admitting: Gastroenterology

## 2023-01-03 ENCOUNTER — Encounter (HOSPITAL_COMMUNITY)
Admission: RE | Disposition: A | Discharge status: Home / Self Care | Payer: Self-pay | Source: Home / Self Care | Attending: Gastroenterology

## 2023-01-03 ENCOUNTER — Ambulatory Visit (HOSPITAL_COMMUNITY)
Admission: RE | Admit: 2023-01-03 | Discharge: 2023-01-03 | Disposition: A | Discharge status: Home / Self Care | Payer: Medicare Other | Attending: Gastroenterology | Admitting: Gastroenterology

## 2023-01-03 ENCOUNTER — Ambulatory Visit (HOSPITAL_COMMUNITY): Payer: Medicare Other | Admitting: Anesthesiology

## 2023-01-03 DIAGNOSIS — J449 Chronic obstructive pulmonary disease, unspecified: Secondary | ICD-10-CM | POA: Diagnosis not present

## 2023-01-03 DIAGNOSIS — Z87891 Personal history of nicotine dependence: Secondary | ICD-10-CM | POA: Insufficient documentation

## 2023-01-03 DIAGNOSIS — Z9889 Other specified postprocedural states: Secondary | ICD-10-CM | POA: Diagnosis not present

## 2023-01-03 DIAGNOSIS — Q438 Other specified congenital malformations of intestine: Secondary | ICD-10-CM | POA: Insufficient documentation

## 2023-01-03 DIAGNOSIS — D127 Benign neoplasm of rectosigmoid junction: Secondary | ICD-10-CM | POA: Diagnosis not present

## 2023-01-03 DIAGNOSIS — M797 Fibromyalgia: Secondary | ICD-10-CM | POA: Diagnosis not present

## 2023-01-03 DIAGNOSIS — K219 Gastro-esophageal reflux disease without esophagitis: Secondary | ICD-10-CM | POA: Insufficient documentation

## 2023-01-03 DIAGNOSIS — Z1211 Encounter for screening for malignant neoplasm of colon: Secondary | ICD-10-CM

## 2023-01-03 DIAGNOSIS — D120 Benign neoplasm of cecum: Secondary | ICD-10-CM

## 2023-01-03 DIAGNOSIS — K641 Second degree hemorrhoids: Secondary | ICD-10-CM | POA: Diagnosis not present

## 2023-01-03 DIAGNOSIS — Z09 Encounter for follow-up examination after completed treatment for conditions other than malignant neoplasm: Secondary | ICD-10-CM | POA: Insufficient documentation

## 2023-01-03 DIAGNOSIS — D123 Benign neoplasm of transverse colon: Secondary | ICD-10-CM | POA: Diagnosis not present

## 2023-01-03 DIAGNOSIS — Q439 Congenital malformation of intestine, unspecified: Secondary | ICD-10-CM | POA: Diagnosis not present

## 2023-01-03 DIAGNOSIS — Z860101 Personal history of adenomatous and serrated colon polyps: Secondary | ICD-10-CM | POA: Diagnosis not present

## 2023-01-03 DIAGNOSIS — I2510 Atherosclerotic heart disease of native coronary artery without angina pectoris: Secondary | ICD-10-CM | POA: Insufficient documentation

## 2023-01-03 DIAGNOSIS — D122 Benign neoplasm of ascending colon: Secondary | ICD-10-CM | POA: Diagnosis not present

## 2023-01-03 DIAGNOSIS — K644 Residual hemorrhoidal skin tags: Secondary | ICD-10-CM | POA: Insufficient documentation

## 2023-01-03 DIAGNOSIS — D126 Benign neoplasm of colon, unspecified: Secondary | ICD-10-CM | POA: Diagnosis not present

## 2023-01-03 DIAGNOSIS — E119 Type 2 diabetes mellitus without complications: Secondary | ICD-10-CM | POA: Insufficient documentation

## 2023-01-03 DIAGNOSIS — I10 Essential (primary) hypertension: Secondary | ICD-10-CM | POA: Diagnosis not present

## 2023-01-03 DIAGNOSIS — K635 Polyp of colon: Secondary | ICD-10-CM

## 2023-01-03 DIAGNOSIS — Z7984 Long term (current) use of oral hypoglycemic drugs: Secondary | ICD-10-CM | POA: Diagnosis not present

## 2023-01-03 DIAGNOSIS — K5730 Diverticulosis of large intestine without perforation or abscess without bleeding: Secondary | ICD-10-CM | POA: Diagnosis not present

## 2023-01-03 DIAGNOSIS — Z860100 Personal history of colon polyps, unspecified: Secondary | ICD-10-CM | POA: Diagnosis not present

## 2023-01-03 HISTORY — PX: POLYPECTOMY: SHX5525

## 2023-01-03 HISTORY — PX: COLONOSCOPY WITH PROPOFOL: SHX5780

## 2023-01-03 LAB — GLUCOSE, CAPILLARY
Glucose-Capillary: 74 mg/dL (ref 70–99)
Glucose-Capillary: 54 mg/dL — ABNORMAL LOW (ref 70–99)
Glucose-Capillary: 62 mg/dL — ABNORMAL LOW (ref 70–99)
Glucose-Capillary: 85 mg/dL (ref 70–99)

## 2023-01-03 SURGERY — COLONOSCOPY WITH PROPOFOL
Anesthesia: Monitor Anesthesia Care

## 2023-01-03 MED ORDER — EPHEDRINE SULFATE (PRESSORS) 50 MG/ML IJ SOLN
INTRAMUSCULAR | Status: DC | PRN
Start: 2023-01-03 — End: 2023-01-03
  Administered 2023-01-03 (×2): 10 mg via INTRAVENOUS

## 2023-01-03 MED ORDER — SODIUM CHLORIDE 0.9 % IV SOLN: INTRAVENOUS | Status: DC

## 2023-01-03 MED ORDER — PROPOFOL 1000 MG/100ML IV EMUL
INTRAVENOUS | Status: AC
  Filled 2023-01-03: qty 100

## 2023-01-03 MED ORDER — PROPOFOL 500 MG/50ML IV EMUL
INTRAVENOUS | Status: DC | PRN
  Administered 2023-01-03: 75 ug/kg/min via INTRAVENOUS

## 2023-01-03 SURGICAL SUPPLY — 21 items
ELECT REM PT RETURN 9FT ADLT (ELECTROSURGICAL) IMPLANT
ELECTRODE REM PT RTRN 9FT ADLT (ELECTROSURGICAL) IMPLANT
FCP BXJMBJMB 240X2.8X (CUTTING FORCEPS)
FLOOR PAD 36X40 (MISCELLANEOUS) ×2 IMPLANT
FORCEPS BIOP RAD 4 LRG CAP 4 (CUTTING FORCEPS) IMPLANT
FORCEPS BIOP RJ4 240 W/NDL (CUTTING FORCEPS) IMPLANT
FORCEPS BXJMBJMB 240X2.8X (CUTTING FORCEPS) IMPLANT
INJECTOR/SNARE I SNARE (MISCELLANEOUS) IMPLANT
LUBRICANT JELLY 4.5OZ STERILE (MISCELLANEOUS) IMPLANT
MANIFOLD NEPTUNE II (INSTRUMENTS) IMPLANT
NDL SCLEROTHERAPY 25GX240 (NEEDLE) IMPLANT
NEEDLE SCLEROTHERAPY 25GX240 (NEEDLE) IMPLANT
PAD FLOOR 36X40 (MISCELLANEOUS) ×2 IMPLANT
PROBE APC STR FIRE (PROBE) IMPLANT
PROBE INJECTION GOLD 7FR (MISCELLANEOUS) IMPLANT
SNARE ROTATE MED OVAL 20MM (MISCELLANEOUS) IMPLANT
SYR 50ML LL SCALE MARK (SYRINGE) IMPLANT
TRAP SPECIMEN MUCOUS 40CC (MISCELLANEOUS) IMPLANT
TUBING ENDO SMARTCAP PENTAX (MISCELLANEOUS) IMPLANT
TUBING IRRIGATION ENDOGATOR (MISCELLANEOUS) ×2 IMPLANT
WATER STERILE IRR 1000ML POUR (IV SOLUTION) IMPLANT

## 2023-01-03 NOTE — Transfer of Care (Signed)
Immediate Anesthesia Transfer of Care Note  Patient: Belinda Day  Procedure(s) Performed: COLONOSCOPY WITH PROPOFOL POLYPECTOMY  Patient Location: PACU  Anesthesia Type:MAC  Level of Consciousness: awake and alert   Airway & Oxygen Therapy: Patient Spontanous Breathing  Post-op Assessment: Report given to RN  Post vital signs: Reviewed and stable  Last Vitals:  Vitals Value Taken Time  BP 96/50 01/03/23 1021  Temp 36.0   Pulse 83 01/03/23 1022  Resp 16 01/03/23 1023  SpO2 95% 01/03/23 1022  Vitals shown include unfiled device data.  Last Pain:  Vitals:   01/03/23 0812  TempSrc: Temporal  PainSc: 0-No pain         Complications: No notable events documented.

## 2023-01-03 NOTE — Anesthesia Postprocedure Evaluation (Signed)
Anesthesia Post Note  Patient: Belinda Day  Procedure(s) Performed: COLONOSCOPY WITH PROPOFOL POLYPECTOMY     Patient location during evaluation: Endoscopy Anesthesia Type: MAC Level of consciousness: awake and alert Pain management: pain level controlled Vital Signs Assessment: post-procedure vital signs reviewed and stable Respiratory status: spontaneous breathing, nonlabored ventilation, respiratory function stable and patient connected to nasal cannula oxygen Cardiovascular status: stable and blood pressure returned to baseline Postop Assessment: no apparent nausea or vomiting Anesthetic complications: no   No notable events documented.  Last Vitals:  Vitals:   01/03/23 1120 01/03/23 1123  BP: (!) 140/53 (!) 140/53  Pulse: 73 76  Resp: 18 18  Temp:    SpO2: 97% 98%    Last Pain:  Vitals:   01/03/23 1022  TempSrc: Temporal  PainSc: 1                  Zerina Hallinan P Jeananne Bedwell

## 2023-01-03 NOTE — Discharge Instructions (Signed)

## 2023-01-03 NOTE — Progress Notes (Signed)
Gave patient another snack before patient was leaving. Peanut butter and graham crackers and a second soda. Told patient to eat lunch when she got home and told her friend to make sure she ate when she got home.  After snack patient stated that she felt better and ready to leave. Last recheck of CBG was normal and has had a snack as above before leaving.

## 2023-01-03 NOTE — H&P (Signed)
GASTROENTEROLOGY PROCEDURE H&P NOTE   Primary Care Physician: Shelva Majestic, MD  HPI: Belinda Day is a 77 y.o. female who presents for Colonoscopy for surveillance of previous TA of the ICV region.  Past Medical History:  Diagnosis Date   Bronchiectasis    oxygen at night in the past   CAD (coronary artery disease)    Cellulitis    COPD (chronic obstructive pulmonary disease) (HCC) bronchiectasis   Diabetes mellitus without complication (HCC) pre-diabetes   Diverticulitis 01/26/2012   GERD (gastroesophageal reflux disease)    History of shingles 04/25/2012   HTN (hypertension)    Hyperlipidemia    Hypothyroidism    MAI (mycobacterium avium-intracellulare) (HCC)    Medication management 10/21/2022   Neuritis of upper extremity    Substance abuse [facility])    Past Surgical History:  Procedure Laterality Date   COLONOSCOPY WITH PROPOFOL N/A 01/21/2022   Procedure: COLONOSCOPY WITH PROPOFOL;  Surgeon: Lemar Lofty., MD;  Location: [facility];  Service: Gastroenterology;  Laterality: N/A;   CORONARY ARTERY BYPASS GRAFT  01/25/2002   x3 CABG   ENDOSCOPIC MUCOSAL RESECTION N/A 01/21/2022   Procedure: ENDOSCOPIC MUCOSAL RESECTION;  Surgeon: Meridee Score Netty Starring., MD;  Location: [facility];  Service: Gastroenterology;  Laterality: N/A;   HAND SURGERY     Dr. Amanda Pea sept 2022- improved hand movement   HEMOSTASIS CLIP PLACEMENT  01/21/2022   Procedure: HEMOSTASIS CLIP PLACEMENT;  Surgeon: Lemar Lofty., MD;  Location: [facility];  Service: Gastroenterology;;   HOT HEMOSTASIS N/A 01/21/2022   Procedure: HOT HEMOSTASIS (ARGON PLASMA COAGULATION/BICAP);  Surgeon: Lemar Lofty., MD;  Location: [facility];  Service: Gastroenterology;  Laterality: N/A;   POLYPECTOMY  01/21/2022   Procedure: POLYPECTOMY;  Surgeon: Mansouraty, Netty Starring., MD;  Location: [facility];  Service: Gastroenterology;;   Sunnie Nielsen LIFTING INJECTION  01/21/2022    Procedure: SUBMUCOSAL LIFTING INJECTION;  Surgeon: Lemar Lofty., MD;  Location: [facility];  Service: Gastroenterology;;   Current Facility-Administered Medications  Medication Dose Route Frequency Provider Last Rate Last Admin   0.9 %  sodium chloride infusion   Intravenous Continuous Mansouraty, Netty Starring., MD        Current Facility-Administered Medications:    0.9 %  sodium chloride infusion, , Intravenous, Continuous, Mansouraty, Netty Starring., MD Allergies  Allergen Reactions   Bactrim [Sulfamethoxazole-Trimethoprim] Hives, Itching and Other (See Comments)    Bruised like areas on body   Ciprofloxacin Other (See Comments)    Body aches   Codeine Nausea Only    REACTION: nausea   Erythromycin Nausea Only    REACTION: nausea   Levaquin [Levofloxacin] Other (See Comments)    REACTION: aches   Tramadol Nausea And Vomiting   Family History  Problem Relation Age of Onset   Colon cancer Mother    Hyperlipidemia Mother    Hypertension Mother    Heart disease Mother    Stroke Mother    Diabetes Mother    Cancer Mother    Colon cancer Father    Arthritis Father    Hyperlipidemia Father    Hypertension Father    Heart disease Father    Stroke Father    Cancer Father    Vision loss Father    Colon cancer Maternal Grandmother    Cancer Maternal Grandmother    Atopy Neg Hx    Stomach cancer Neg Hx    Esophageal cancer Neg Hx    Pancreatic cancer Neg Hx    Inflammatory bowel  disease Neg Hx    Liver disease Neg Hx    Rectal cancer Neg Hx    Social History   Socioeconomic History   Marital status: Single    Spouse name: Not on file   Number of children: Not on file   Years of education: Not on file   Highest education level: Bachelor's degree (e.g., BA, AB, BS)  Occupational History   Occupation: Retired   Tobacco Use   Smoking status: Former    Current packs/day: 0.00    Average packs/day: 1 pack/day for 25.0 years (25.0 ttl pk-yrs)    Types:  Cigarettes    Start date: 01/26/1955    Quit date: 01/26/1980    Years since quitting: 42.9   Smokeless tobacco: Never  Substance and Sexual Activity   Alcohol use: No   Drug use: No   Sexual activity: Not Currently    Birth control/protection: None  Other Topics Concern   Not on file  Social History Narrative   Family: Single never married, no children, cat and rehabs turtles and tortoise   Went to [employer] in [place] and social work, some business courses at [employer], EMT for 6 years.    LIves alone. Completely independent.    Lives in retirement community.       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      Hobbies: kayaking, gardening- mows own lawn   Social Determinants of Health   Financial Resource Strain: Low Risk  (11/04/2022)   Overall Physicist, medical Strain (CARDIA)    Difficulty of Paying Living Expenses: Not very hard  Food Insecurity: No Food Insecurity (11/04/2022)   Hunger Vital Sign    Worried About Running Out of Food in the Last Year: Never true    Ran Out of Food in the Last Year: Never true  Transportation Needs: No Transportation Needs (11/04/2022)   PRAPARE - Administrator, Civil Service (Medical): No    Lack of Transportation (Non-Medical): No  Physical Activity: Insufficiently Active (11/04/2022)   Exercise Vital Sign    Days of Exercise per Week: 1 day    Minutes of Exercise per Session: 60 min  Stress: Stress Concern Present (11/04/2022)   [employer] of Occupational Health - Occupational Stress Questionnaire    Feeling of Stress : To some extent  Social Connections: Moderately Isolated (11/04/2022)   Social Connection and Isolation Panel [NHANES]    Frequency of Communication with Friends and Family: More than three times a week    Frequency of Social Gatherings with Friends and Family: More than three times a week    Attends Religious Services: 1 to 4 times per year     Active Member of [employer] or Organizations: No    Attends Banker Meetings: Never    Marital Status: Never married  Intimate Partner Violence: Not At Risk (07/12/2022)   Humiliation, Afraid, Rape, and Kick questionnaire    Fear of Current or Ex-Partner: No    Emotionally Abused: No    Physically Abused: No    Sexually Abused: No    Physical Exam: Today's Vitals   12/27/22 1243 12/27/22 1244  Weight: 51.7 kg 49.4 kg   Body mass index is 20.6 kg/m. GEN: NAD EYE: Sclerae anicteric ENT: MMM CV: Non-tachycardic GI: Soft, NT/ND NEURO:  Alert & Oriented x 3  Lab Results: No results for input(s): "WBC", "HGB", "HCT", "PLT" in the last 72 hours. BMET No results  for input(s): "NA", "K", "CL", "CO2", "GLUCOSE", "BUN", "CREATININE", "CALCIUM" in the last 72 hours. LFT No results for input(s): "PROT", "ALBUMIN", "AST", "ALT", "ALKPHOS", "BILITOT", "BILIDIR", "IBILI" in the last 72 hours. PT/INR No results for input(s): "LABPROT", "INR" in the last 72 hours.   Impression / Plan: This is a 77 y.o.female who presents for Colonoscopy for surveillance of previous TA of the ICV region.  The risks and benefits of endoscopic evaluation/treatment were discussed with the patient and/or family; these include but are not limited to the risk of perforation, infection, bleeding, missed lesions, lack of diagnosis, severe illness requiring hospitalization, as well as anesthesia and sedation related illnesses.  The patient's history has been reviewed, patient examined, no change in status, and deemed stable for procedure.  The patient and/or family is agreeable to proceed.    Corliss Parish, MD [facility] Office # [phone]

## 2023-01-03 NOTE — Anesthesia Preprocedure Evaluation (Addendum)
Anesthesia Evaluation  Patient identified by MRN, date of birth, ID band Patient awake    Reviewed: Allergy & Precautions, NPO status , Patient's Chart, lab work & pertinent test results  Airway Mallampati: II  TM Distance: >3 FB Neck ROM: Full    Dental no notable dental hx.    Pulmonary COPD, former smoker   Pulmonary exam normal        Cardiovascular hypertension, Pt. on medications and Pt. on home beta blockers + CAD   Rhythm:Regular Rate:Normal     Neuro/Psych negative neurological ROS  negative psych ROS   GI/Hepatic Neg liver ROS,GERD  ,,polyps   Endo/Other  diabetes, Type 2, Oral Hypoglycemic AgentsHypothyroidism    Renal/GU negative Renal ROS     Musculoskeletal  (+)  Fibromyalgia -  Abdominal Normal abdominal exam  (+)   Peds  Hematology Lab Results      Component                Value               Date                      WBC                      7.2                 10/14/2022                HGB                      13.6                10/14/2022                HCT                      41.7                10/14/2022                MCV                      97.0                10/14/2022                PLT                      265                 10/14/2022             Lab Results      Component                Value               Date                      NA                       137                 10/14/2022                K  4.1                 10/14/2022                CO2                      28                  10/14/2022                GLUCOSE                  104 (H)             10/14/2022                BUN                      15                  10/14/2022                CREATININE               0.77                10/14/2022                CALCIUM                  9.5                 10/14/2022                GFR                      73.44               06/30/2022                 GFRNONAA                 >60                 10/14/2022              Anesthesia Other Findings   Reproductive/Obstetrics                             Anesthesia Physical Anesthesia Plan  ASA: 3  Anesthesia Plan: MAC   Post-op Pain Management:    Induction:   PONV Risk Score and Plan: 2 and Treatment may vary due to age or medical condition and Propofol infusion  Airway Management Planned: Simple Face Mask and Nasal Cannula  Additional Equipment: None  Intra-op Plan:   Post-operative Plan:   Informed Consent: I have reviewed the patients History and Physical, chart, labs and discussed the procedure including the risks, benefits and alternatives for the proposed anesthesia with the patient or authorized representative who has indicated his/her understanding and acceptance.     Dental advisory given  Plan Discussed with: CRNA  Anesthesia Plan Comments:        Anesthesia Quick Evaluation

## 2023-01-03 NOTE — Op Note (Signed)
[facility] Patient Name: Belinda Day Procedure Date: 01/03/2023 MRN: [id number] Attending MD: Corliss Parish , MD, [id number] Date of Birth: Aug 25, 1945 CSN: [id number] Age: 77 Admit Type: Outpatient Procedure:                Colonoscopy Indications:              Surveillance: Personal history of piecemeal removal                            of adenoma on last colonoscopy (less than 1 year                            ago)Resections at [facility] advanced                            endoscopy with recurrence of adenoma now status                            post piecemeal EMR in 2023 for follow-up                            surveillance today Providers:                Corliss Parish, MD, Doristine Mango, RN,                            Kandice Robinsons, Technician Referring MD:             Shirley Friar, MD Medicines:                Monitored Anesthesia Care Complications:            No immediate complications. Estimated Blood Loss:     Estimated blood loss was minimal. Procedure:                Pre-Anesthesia Assessment:                           - Prior to the procedure, a History and Physical                            was performed, and patient medications and                            allergies were reviewed. The patient's tolerance of                            previous anesthesia was also reviewed. The risks                            and benefits of the procedure and the sedation                            options and risks were discussed with the patient.  All questions were answered, and informed consent                            was obtained. Prior Anticoagulants: The patient has                            taken no anticoagulant or antiplatelet agents. ASA                            Grade Assessment: III - A patient with severe                            systemic disease. After reviewing the risks and                             benefits, the patient was deemed in satisfactory                            condition to undergo the procedure.                           After obtaining informed consent, the colonoscope                            was passed under direct vision. Throughout the                            procedure, the patient's blood pressure, pulse, and                            oxygen saturations were monitored continuously. The                            [id number]-HQ190L ([id number] Olympus colonoscope was                            introduced through the anus and advanced to the 3                            cm into the ileum. The patient tolerated the                            procedure. The quality of the bowel preparation was                            adequate. The terminal ileum, ileocecal valve,                            appendiceal orifice, and rectum were photographed.                            The colonoscopy was somewhat difficult due to a  tortuous colon. Successful completion of the                            procedure was aided by changing the patient's                            position, using manual pressure, straightening and                            shortening the scope to obtain bowel loop reduction                            and using scope torsion. Scope In: 9:47:23 AM Scope Out: 10:15:43 AM Scope Withdrawal Time: 0 hours 17 minutes 50 seconds  Total Procedure Duration: 0 hours 28 minutes 20 seconds  Findings:      The digital rectal exam findings include hemorrhoids. Pertinent       negatives include no palpable rectal lesions.      The colon (entire examined portion) was significantly tortuous.      The terminal ileum appeared normal.      A medium post mucosectomy scar was found at the region of the ileocecal       valve lip towards the ascending colon portion of the ileocecal. There       was a small area of 4 mm that was  polypoid-like?"more appearance of       granular tissue rather than adenomatous. This was sampled/removed with a       cold snare for histology. There is previous tattoo in the site.      Three sessile polyps were found in the recto-sigmoid colon, transverse       colon and ascending colon. The polyps were 2 to 5 mm in size. These       polyps were removed with a cold snare. Resection and retrieval were       complete.      Many medium-mouthed and small-mouthed diverticula were found in the       entire colon.      Normal mucosa was found in the entire colon.      Non-bleeding non-thrombosed external and internal hemorrhoids were found       during retroflexion, during perianal exam and during digital exam. The       hemorrhoids were Grade II (internal hemorrhoids that prolapse but reduce       spontaneously). Impression:               - Hemorrhoids found on digital rectal exam.                           - Tortuous entire colon.                           - The examined portion of the terminal ileum was                            normal.                           - Post mucosectomy scar at the ileocecal valve.  There was a small 4 mm polypoid region that was                            removed. Tattoo noted underneath.                           - Three 2 to 5 mm polyps at the recto-sigmoid                            colon, in the transverse colon and in the ascending                            colon, removed with a cold snare. Resected and                            retrieved.                           - Diverticulosis in the entire examined colon.                           - Normal mucosa in the entire examined colon                            otherwise.                           - Non-bleeding non-thrombosed external and internal                            hemorrhoids. Moderate Sedation:      Not Applicable - Patient had care per Anesthesia. Recommendation:            - The patient will be observed post-procedure,                            until all discharge criteria are met.                           - Discharge patient to home.                           - Patient has a contact number available for                            emergencies. The signs and symptoms of potential                            delayed complications were discussed with the                            patient. Return to normal activities tomorrow.                            Written discharge instructions were provided to the  patient.                           - High fiber diet.                           - Use FiberCon 1-2 tablets PO daily.                           - Continue present medications.                           - Await pathology results.                           - As long as there is no evidence of recurrent                            adenomatous tissue then consideration of repeat                            colonoscopy in 3 years for surveillance based on                            pathology results.                           - The findings and recommendations were discussed                            with the patient.                           - The findings and recommendations were discussed                            with the patient's family. Procedure Code(s):        --- Professional ---                           [phone], Colonoscopy, flexible; with removal of                            tumor(s), polyp(s), or other lesion(s) by snare                            technique Diagnosis Code(s):        --- Professional ---                           W29.562, Other specified postprocedural states                           D12.7, Benign neoplasm of rectosigmoid junction                           D12.3, Benign neoplasm of transverse colon (hepatic  flexure or splenic flexure)                           D12.2,  Benign neoplasm of ascending colon                           K64.1, Second degree hemorrhoids                           Z09, Encounter for follow-up examination after                            completed treatment for conditions other than                            malignant neoplasm                           Z86.010, Personal history of colonic polyps                           K57.30, Diverticulosis of large intestine without                            perforation or abscess without bleeding                           Q43.8, Other specified congenital malformations of                            intestine CPT copyright 2022 American Medical Association. All rights reserved. The codes documented in this report are preliminary and upon coder review may  be revised to meet current compliance requirements. Corliss Parish, MD 01/03/2023 10:42:55 AM Number of Addenda: 0

## 2023-01-04 ENCOUNTER — Encounter: Payer: Self-pay | Admitting: Gastroenterology

## 2023-01-04 LAB — SURGICAL PATHOLOGY

## 2023-01-05 ENCOUNTER — Encounter (HOSPITAL_COMMUNITY): Payer: Self-pay | Admitting: Gastroenterology

## 2023-01-05 ENCOUNTER — Encounter (HOSPITAL_BASED_OUTPATIENT_CLINIC_OR_DEPARTMENT_OTHER): Payer: Medicare Other | Attending: Internal Medicine | Admitting: Internal Medicine

## 2023-01-05 DIAGNOSIS — S81812A Laceration without foreign body, left lower leg, initial encounter: Secondary | ICD-10-CM | POA: Insufficient documentation

## 2023-01-05 DIAGNOSIS — E11622 Type 2 diabetes mellitus with other skin ulcer: Secondary | ICD-10-CM | POA: Diagnosis not present

## 2023-01-05 DIAGNOSIS — J4489 Other specified chronic obstructive pulmonary disease: Secondary | ICD-10-CM | POA: Insufficient documentation

## 2023-01-05 DIAGNOSIS — W11XXXA Fall on and from ladder, initial encounter: Secondary | ICD-10-CM | POA: Insufficient documentation

## 2023-01-05 DIAGNOSIS — L97822 Non-pressure chronic ulcer of other part of left lower leg with fat layer exposed: Secondary | ICD-10-CM | POA: Diagnosis not present

## 2023-01-05 DIAGNOSIS — L03116 Cellulitis of left lower limb: Secondary | ICD-10-CM | POA: Diagnosis not present

## 2023-01-05 DIAGNOSIS — I10 Essential (primary) hypertension: Secondary | ICD-10-CM | POA: Diagnosis not present

## 2023-01-06 ENCOUNTER — Ambulatory Visit: Payer: Medicare Other | Admitting: Family Medicine

## 2023-01-06 ENCOUNTER — Encounter: Payer: Self-pay | Admitting: Family Medicine

## 2023-01-06 VITALS — BP 122/70 | Temp 97.3°F | Ht 61.0 in | Wt 113.2 lb

## 2023-01-06 DIAGNOSIS — I10 Essential (primary) hypertension: Secondary | ICD-10-CM | POA: Diagnosis not present

## 2023-01-06 DIAGNOSIS — Z7984 Long term (current) use of oral hypoglycemic drugs: Secondary | ICD-10-CM

## 2023-01-06 DIAGNOSIS — E039 Hypothyroidism, unspecified: Secondary | ICD-10-CM

## 2023-01-06 DIAGNOSIS — M810 Age-related osteoporosis without current pathological fracture: Secondary | ICD-10-CM

## 2023-01-06 DIAGNOSIS — E785 Hyperlipidemia, unspecified: Secondary | ICD-10-CM | POA: Diagnosis not present

## 2023-01-06 DIAGNOSIS — E119 Type 2 diabetes mellitus without complications: Secondary | ICD-10-CM

## 2023-01-06 LAB — CBC WITH DIFFERENTIAL/PLATELET
Basophils Absolute: 0 10*3/uL (ref 0.0–0.1)
Basophils Relative: 0.6 % (ref 0.0–3.0)
Eosinophils Absolute: 0 10*3/uL (ref 0.0–0.7)
Eosinophils Relative: 0.6 % (ref 0.0–5.0)
HCT: 37.5 % (ref 36.0–46.0)
Hemoglobin: 12.5 g/dL (ref 12.0–15.0)
Lymphocytes Relative: 16 % (ref 12.0–46.0)
Lymphs Abs: 1 10*3/uL (ref 0.7–4.0)
MCHC: 33.4 g/dL (ref 30.0–36.0)
MCV: 97.3 fL (ref 78.0–100.0)
Monocytes Absolute: 0.4 10*3/uL (ref 0.1–1.0)
Monocytes Relative: 6.4 % (ref 3.0–12.0)
Neutro Abs: 4.5 10*3/uL (ref 1.4–7.7)
Neutrophils Relative %: 76.4 % (ref 43.0–77.0)
Platelets: 277 10*3/uL (ref 150.0–400.0)
RBC: 3.85 Mil/uL — ABNORMAL LOW (ref 3.87–5.11)
RDW: 13.8 % (ref 11.5–15.5)
WBC: 5.9 10*3/uL (ref 4.0–10.5)

## 2023-01-06 LAB — COMPREHENSIVE METABOLIC PANEL
ALT: 18 U/L (ref 0–35)
AST: 26 U/L (ref 0–37)
Albumin: 4 g/dL (ref 3.5–5.2)
Alkaline Phosphatase: 70 U/L (ref 39–117)
BUN: 12 mg/dL (ref 6–23)
CO2: 27 meq/L (ref 19–32)
Calcium: 8.7 mg/dL (ref 8.4–10.5)
Chloride: 103 meq/L (ref 96–112)
Creatinine, Ser: 0.75 mg/dL (ref 0.40–1.20)
GFR: 75.52 mL/min (ref >60.00)
Glucose, Bld: 89 mg/dL (ref 70–99)
Potassium: 3.5 meq/L (ref 3.5–5.1)
Sodium: 138 meq/L (ref 135–145)
Total Bilirubin: 0.4 mg/dL (ref 0.2–1.2)
Total Protein: 6.2 g/dL (ref 6.0–8.3)

## 2023-01-06 LAB — LIPID PANEL
Cholesterol: 126 mg/dL (ref 0–200)
HDL: 67.1 mg/dL (ref >39.00)
LDL Cholesterol: 46 mg/dL (ref 0–99)
NonHDL: 58.4
Total CHOL/HDL Ratio: 2
Triglycerides: 62 mg/dL (ref 0.0–149.0)
VLDL: 12.4 mg/dL (ref 0.0–40.0)

## 2023-01-06 LAB — HEMOGLOBIN A1C: Hgb A1c MFr Bld: 6.2 % (ref 4.6–6.5)

## 2023-01-06 LAB — TSH: TSH: 1.67 u[IU]/mL (ref 0.35–5.50)

## 2023-01-06 MED ORDER — ALENDRONATE SODIUM 70 MG PO TABS: 70.0000 mg | ORAL_TABLET | ORAL | 11 refills | Status: DC

## 2023-01-06 NOTE — Progress Notes (Signed)
CAROLEANN, GERTZ A ([id number]) [id number] Page 1 of 8 Visit Report for 01/05/2023 Arrival Information Details Patient Name: Date of Service: MUSHTAQ, SHRAMEK A. 01/05/2023 9:00 A M Medical Record Number: [id number] Patient Account Number: [id number] Date of Birth/Sex: Treating RN: 10/07/1945 (77 y.o. Katrinka Blazing Primary Care Theresia Pree: Tana Conch Other Clinician: Referring Xylan Sheils: Treating Graeme Menees/Extender: Jaquelyn Bitter in Treatment: 10 Visit Information History Since Last Visit Added or deleted any medications: No Patient Arrived: Ambulatory Any new allergies or adverse reactions: No Arrival Time: 08:47 Had a fall or experienced change in No Accompanied By: Self activities of daily living that may affect Transfer Assistance: None risk of falls: Patient Identification Verified: Yes Signs or symptoms of abuse/neglect since last visito No Patient Requires Transmission-Based Precautions: No Hospitalized since last visit: No Patient Has Alerts: Yes Implantable device outside of the clinic excluding No Patient Alerts: Patient on Blood Thinner cellular tissue based products placed in the center aspirin 81mg  since last visit: No ABI- Very painful. Has Dressing in Place as Prescribed: Yes Pain Present Now: Yes Electronic Signature(s) Signed: 01/05/2023 12:29:48 PM By: Karie Schwalbe RN Entered By: Karie Schwalbe on 01/05/2023 05:49:07 -------------------------------------------------------------------------------- Clinic Level of Care Assessment Details Patient Name: Date of Service: NISHKA, SALINAS A. 01/05/2023 9:00 A M Medical Record Number: [id number] Patient Account Number: [id number] Date of Birth/Sex: Treating RN: 07/24/1945 (77 y.o. Katrinka Blazing Primary Care Jacquetta Polhamus: Tana Conch Other Clinician: Referring Knowledge Escandon: Treating San Lohmeyer/Extender: Jaquelyn Bitter in Treatment:  10 Clinic Level of Care Assessment Items TOOL 1 Quantity Score X- 1 0 Use when EandM and Procedure is performed on INITIAL visit ASSESSMENTS - Nursing Assessment / Reassessment X- 1 20 General Physical Exam (combine w/ comprehensive assessment (listed just below) when performed on new pt. evals) X- 1 25 Comprehensive Assessment (HX, ROS, Risk Assessments, Wounds Hx, etc.) ASSESSMENTS - Wound and Skin Assessment / Reassessment X- 1 10 Dermatologic / Skin Assessment (not related to wound area) ASSESSMENTS - Ostomy and/or Continence Assessment and Care []  - 0 Incontinence Assessment and Management []  - 0 Ostomy Care Assessment and Management (repouching, etc.) PROCESS - Coordination of Care X - Simple Patient / Family Education for ongoing care 1 15 []  - 0 Complex (extensive) Patient / Family Education for ongoing care LEBERTA, WHOOLERY A ([id number]) [id number] Page 2 of 8 X- 1 10 Staff obtains Consents, Records, T Results / Process Orders est X- 1 10 Staff telephones HHA, Nursing Homes / Clarify orders / etc []  - 0 Routine Transfer to another Facility (non-emergent condition) []  - 0 Routine Hospital Admission (non-emergent condition) X- 1 15 New Admissions / Manufacturing engineer / Ordering NPWT Apligraf, etc. , []  - 0 Emergency Hospital Admission (emergent condition) PROCESS - Special Needs []  - 0 Pediatric / Minor Patient Management []  - 0 Isolation Patient Management []  - 0 Hearing / Language / Visual special needs []  - 0 Assessment of Community assistance (transportation, D/C planning, etc.) []  - 0 Additional assistance / Altered mentation []  - 0 Support Surface(s) Assessment (bed, cushion, seat, etc.) INTERVENTIONS - Miscellaneous []  - 0 External ear exam []  - 0 Patient Transfer (multiple staff / Nurse, adult / Similar devices) []  - 0 Simple Staple / Suture removal (25 or less) []  - 0 Complex Staple / Suture removal (26 or  more) []  - 0 Hypo/Hyperglycemic Management (do not check if billed separately) []  - 0 Ankle / Brachial Index (ABI) - do not check if billed separately  Has the patient been seen at the hospital within the last three years: Yes Total Score: 105 Level Of Care: New/Established - Level 3 Electronic Signature(s) Signed: 01/05/2023 12:29:48 PM By: Karie Schwalbe RN Entered By: Karie Schwalbe on 01/05/2023 07:44:01 -------------------------------------------------------------------------------- Encounter Discharge Information Details Patient Name: Date of Service: [name] 01/05/2023 9:00 A M Medical Record Number: [id number] Patient Account Number: [id number] Date of Birth/Sex: Treating RN: [birth year] (77 y.o. Katrinka Blazing Primary Care Shametra Cumberland: Tana Conch Other Clinician: Referring Francis Doenges: Treating Samone Guhl/Extender: Jaquelyn Bitter in Treatment: 10 Encounter Discharge Information Items Discharge Condition: Stable Ambulatory Status: Ambulatory Discharge Destination: Home Transportation: Private Auto Accompanied By: self Schedule Follow-up Appointment: Yes Clinical Summary of Care: Patient Declined Electronic Signature(s) Signed: 01/05/2023 12:29:48 PM By: Karie Schwalbe RN Entered By: Karie Schwalbe on 01/05/2023 07:44:49 Brayton Layman A ([id number]) [id number] Page 3 of 8 -------------------------------------------------------------------------------- Lower Extremity Assessment Details Patient Name: Date of Service: TAYJAH, ORBACH A. 01/05/2023 9:00 A M Medical Record Number: [id number] Patient Account Number: [id number] Date of Birth/Sex: Treating RN: Jan 28, 1945 (77 y.o. Katrinka Blazing Primary Care Batya Citron: Tana Conch Other Clinician: Referring Nora Sabey: Treating Divon Krabill/Extender: Jaquelyn Bitter in Treatment: 10 Edema Assessment Assessed: Kyra Searles: No] Franne Forts:  No] Edema: [Left: N] [Right: o] Calf Left: Right: Point of Measurement: From Medial Instep 32.3 cm Ankle Left: Right: Point of Measurement: From Medial Instep 20.1 cm Vascular Assessment Pulses: Dorsalis Pedis Palpable: [Left:Yes] Extremity colors, hair growth, and conditions: Extremity Color: [Left:Normal] Hair Growth on Extremity: [Left:Yes] Temperature of Extremity: [Left:Warm] Capillary Refill: [Left:< 3 seconds] Dependent Rubor: [Left:No No] Electronic Signature(s) Signed: 01/05/2023 12:29:48 PM By: Karie Schwalbe RN Entered By: Karie Schwalbe on 01/05/2023 05:55:40 -------------------------------------------------------------------------------- Multi Wound Chart Details Patient Name: Date of Service: [name] 01/05/2023 9:00 A M Medical Record Number: [id number] Patient Account Number: [id number] Date of Birth/Sex: Treating RN: 04-27-1945 (77 y.o. F) Primary Care Pennye Beeghly: Tana Conch Other Clinician: Referring Maelee Hoot: Treating Liani Caris/Extender: Jaquelyn Bitter in Treatment: 10 Vital Signs Height(in): 61 Pulse(bpm): 75 Weight(lbs): 113 Blood Pressure(mmHg): 125/77 Body Mass Index(BMI): 21.3 Temperature(F): 98.3 Respiratory Rate(breaths/min): 16 [1:Photos:] [N/A:N/A] Left, Anterior Lower Leg N/A N/A Wound Location: Skin T ear/Laceration N/A N/A Wounding Event: Cellulitis N/A N/A Primary Etiology: Diabetic Wound/Ulcer of the Lower N/A N/A Secondary Etiology: Extremity Chronic Obstructive Pulmonary N/A N/A Comorbid History: Disease (COPD), Coronary Artery Disease, Hypertension, Type II Diabetes 08/28/2022 N/A N/A Date Acquired: 10 N/A N/A Weeks of Treatment: Open N/A N/A Wound Status: No N/A N/A Wound Recurrence: 1.5x1.9x0.1 N/A N/A Measurements L x W x D (cm) 2.238 N/A N/A A (cm) : rea 0.224 N/A N/A Volume (cm) : -185.10% N/A N/A % Reduction in Area: -183.50% N/A N/A % Reduction in Volume: Full  Thickness Without Exposed N/A N/A Classification: Support Structures Small N/A N/A Exudate A mount: Serosanguineous N/A N/A Exudate Type: red, brown N/A N/A Exudate Color: Distinct, outline attached N/A N/A Wound Margin: Large (67-100%) N/A N/A Granulation Amount: Red N/A N/A Granulation Quality: Small (1-33%) N/A N/A Necrotic Amount: Eschar, Adherent Slough N/A N/A Necrotic Tissue: Fat Layer (Subcutaneous Tissue): Yes N/A N/A Exposed Structures: Fascia: No Tendon: No Muscle: No Joint: No Bone: No Small (1-33%) N/A N/A Epithelialization: Excoriation: No N/A N/A Periwound Skin Texture: Induration: No Callus: No Crepitus: No Rash: No Scarring: No Maceration: No N/A N/A Periwound Skin Moisture: Dry/Scaly: No Atrophie Blanche: No N/A N/A Periwound Skin Color: Cyanosis: No Ecchymosis: No Erythema: No Hemosiderin  Staining: No Mottled: No Pallor: No Rubor: No No Abnormality N/A N/A Temperature: Treatment Notes Electronic Signature(s) Signed: 01/05/2023 5:18:28 PM By: Baltazar Najjar MD Entered By: Baltazar Najjar on 01/05/2023 06:24:08 -------------------------------------------------------------------------------- Multi-Disciplinary Care Plan Details Patient Name: Date of Service: [name] 01/05/2023 9:00 A M Medical Record Number: [id number] Patient Account Number: [id number] Date of Birth/Sex: Treating RN: 02-01-1945 (77 y.o. Katrinka Blazing Primary Care Danaisha Celli: Tana Conch Other Clinician: Referring Torrance Stockley: Treating Ciarah Peace/Extender: Daphene Calamity, Vergia Alberts ([id number]) [id number] Page 5 of 8 Weeks in Treatment: 10 Active Inactive Wound/Skin Impairment Nursing Diagnoses: Impaired tissue integrity Goals: Patient/caregiver will verbalize understanding of skin care regimen Date Initiated: 10/27/2022 Target Resolution Date: 02/25/2023 Goal Status: Active Interventions: Assess  ulceration(s) every visit Treatment Activities: Skin care regimen initiated : 10/27/2022 Notes: Electronic Signature(s) Signed: 01/05/2023 12:29:48 PM By: Karie Schwalbe RN Entered By: Karie Schwalbe on 01/05/2023 07:43:21 -------------------------------------------------------------------------------- Pain Assessment Details Patient Name: Date of Service: [name] 01/05/2023 9:00 A M Medical Record Number: [id number] Patient Account Number: [id number] Date of Birth/Sex: Treating RN: 1945-04-14 (77 y.o. Katrinka Blazing Primary Care Kierra Jezewski: Tana Conch Other Clinician: Referring Mitsuko Luera: Treating Ott Zimmerle/Extender: Jaquelyn Bitter in Treatment: 10 Active Problems Location of Pain Severity and Description of Pain Patient Has Paino Yes Site Locations Pain Location: Generalized Pain With Dressing Change: No Duration of the Pain. Constant / Intermittento Constant Rate the pain. Current Pain Level: 3 Worst Pain Level: 10 Least Pain Level: 3 Tolerable Pain Level: 5 Character of Pain Describe the Pain: Dull Pain Management and Medication Current Pain Management: Medication: Yes Cold Application: No Rest: Yes Massage: No Zamarripa, [place] A ([id number]) [id number] Page 6 of 8 Activity: No T.E.N.S.: No Heat Application: No Leg drop or elevation: No Is the Current Pain Management Adequate: Adequate How does your wound impact your activities of daily livingo Sleep: No Bathing: No Appetite: No Relationship With Others: No Bladder Continence: No Emotions: No Bowel Continence: No Work: No Toileting: No Drive: No Dressing: No Hobbies: No Electronic Signature(s) Signed: 01/05/2023 12:29:48 PM By: Karie Schwalbe RN Entered By: Karie Schwalbe on 01/05/2023 05:54:07 -------------------------------------------------------------------------------- Patient/Caregiver Education Details Patient Name: Date of  Service: Jorge Ny 12/11/2024andnbsp9:00 A M Medical Record Number: [id number] Patient Account Number: [id number] Date of Birth/Gender: Treating RN: 10-29-1945 (77 y.o. Katrinka Blazing Primary Care Physician: Tana Conch Other Clinician: Referring Physician: Treating Physician/Extender: Jaquelyn Bitter in Treatment: 10 Education Assessment Education Provided To: Patient Education Topics Provided Wound/Skin Impairment: Methods: Explain/Verbal Responses: State content correctly Electronic Signature(s) Signed: 01/05/2023 12:29:48 PM By: Karie Schwalbe RN Entered By: Karie Schwalbe on 01/05/2023 07:43:35 -------------------------------------------------------------------------------- Wound Assessment Details Patient Name: Date of Service: [name] 01/05/2023 9:00 A M Medical Record Number: [id number] Patient Account Number: [id number] Date of Birth/Sex: Treating RN: 12/31/1945 (77 y.o. Katrinka Blazing Primary Care Jkwon Treptow: Tana Conch Other Clinician: Referring Satoshi Kalas: Treating Casey Maxfield/Extender: Jaquelyn Bitter in Treatment: 10 Wound Status Wound Number: 1 Primary Cellulitis Etiology: Wound Location: Left, Anterior Lower Leg Secondary Diabetic Wound/Ulcer of the Lower Extremity Wounding Event: Skin Tear/Laceration Etiology: Date Acquired: 08/28/2022 Wound Open Weeks Of Treatment: 10 Status: Clustered Wound: No Perryman, Areona A ([id number]) [id number].pdf Page 7 of 8 Clustered Wound: No Comorbid Chronic Obstructive Pulmonary Disease (COPD), Coronary History: Artery Disease, Hypertension, Type II Diabetes Photos Wound Measurements Length: (cm) 1.5 Width: (cm) 1.9 Depth: (cm) 0.1 Area: (cm) 2.238 Volume: (cm) 0.224 %  Reduction in Area: -185.1% % Reduction in Volume: -183.5% Epithelialization: Small (1-33%) Tunneling: No Undermining: No Wound  Description Classification: Full Thickness Without Exposed Support Structures Wound Margin: Distinct, outline attached Exudate Amount: Small Exudate Type: Serosanguineous Exudate Color: red, brown Foul Odor After Cleansing: No Slough/Fibrino Yes Wound Bed Granulation Amount: Large (67-100%) Exposed Structure Granulation Quality: Red Fascia Exposed: No Necrotic Amount: Small (1-33%) Fat Layer (Subcutaneous Tissue) Exposed: Yes Necrotic Quality: Eschar, Adherent Slough Tendon Exposed: No Muscle Exposed: No Joint Exposed: No Bone Exposed: No Periwound Skin Texture Texture Color No Abnormalities Noted: No No Abnormalities Noted: No Callus: No Atrophie Blanche: No Crepitus: No Cyanosis: No Excoriation: No Ecchymosis: No Induration: No Erythema: No Rash: No Hemosiderin Staining: No Scarring: No Mottled: No Pallor: No Moisture Rubor: No No Abnormalities Noted: No Dry / Scaly: No Temperature / Pain Maceration: No Temperature: No Abnormality Treatment Notes Wound #1 (Lower Leg) Wound Laterality: Left, Anterior Cleanser Soap and Water Discharge Instruction: May shower and wash wound with dial antibacterial soap and water prior to dressing change. Vashe 5.8 (oz) Discharge Instruction: Cleanse the wound with Vashe prior to applying a clean dressing using gauze sponges, not tissue or cotton balls. Peri-Wound Care Topical Primary Dressing Hydrofera Blue Ready Transfer Foam, 2.5x2.5 (in/in) Discharge Instruction: Apply directly to wound bed as directed Secondary Dressing Woven Gauze Sponge, Non-Sterile 4x4 in Wholey, Esly A ([id number]) [phone].pdf Page 8 of 8 Discharge Instruction: Apply over primary dressing as directed. Secured With Conforming Stretch Gauze Bandage, Sterile 2x75 (in/in) Discharge Instruction: Secure with stretch gauze as directed. 24M Medipore Soft Cloth Surgical T 2x10 (in/yd) ape Discharge Instruction: Secure with tape as  directed. Compression Wrap Compression Stockings Add-Ons Electronic Signature(s) Signed: 01/05/2023 12:29:48 PM By: Karie Schwalbe RN Entered By: Karie Schwalbe on 01/05/2023 06:01:37 -------------------------------------------------------------------------------- Vitals Details Patient Name: Date of Service: [name] 01/05/2023 9:00 A M Medical Record Number: [id number] Patient Account Number: [id number] Date of Birth/Sex: Treating RN: 1945-07-09 (77 y.o. Katrinka Blazing Primary Care Jonna Dittrich: Tana Conch Other Clinician: Referring Malya Cirillo: Treating Karinda Cabriales/Extender: Jaquelyn Bitter in Treatment: 10 Vital Signs Time Taken: 08:50 Temperature (F): 98.3 Height (in): 61 Pulse (bpm): 75 Weight (lbs): 113 Respiratory Rate (breaths/min): 16 Body Mass Index (BMI): 21.3 Blood Pressure (mmHg): 125/77 Reference Range: 80 - 120 mg / dl Electronic Signature(s) Signed: 01/05/2023 12:29:48 PM By: Karie Schwalbe RN Entered By: Karie Schwalbe on 01/05/2023 05:53:24

## 2023-01-06 NOTE — Patient Instructions (Addendum)
You have osteoporosis At a minimum, recommend 800 IU of vitamin D and 1200mg  of Calcium per day. You can get this with a calcium-vitamin D supplement.   If you are up-to-date on your dental work and not currently having reflux symptoms- Also recommend taking  fosamax 70mg  once a week.  Administer first thing in the morning and >30 minutes before the first food, beverage (except plain water), or other medication of the day. Do not take with mineral water or with other beverages. Stay upright (not to lie down) for at least 30 minutes after taking medicine and until after first food of the day (to reduce irritation). Must be taken with 6 to 8 oz of plain water. The tablet should be swallowed whole; do not chew or suck.  I also recommend regular weight bearing exercise as able once leg better. Weight-bearing aerobic activities involve doing aerobic exercise on your feet, with your bones supporting your weight. Examples include walking, dancing, low-impact aerobics, elliptical training machines, stair climbing and gardening.   Please stop by lab before you go If you have mychart- we will send your results within 3 business days of [place] receiving them.  If you do not have mychart- we will call you about results within 5 business days of [place] receiving them.  *please also note that you will see labs on mychart as soon as they post. I will later go in and write notes on them- will say "notes from Dr. Durene Cal"   All things considered- I'm thrilled you are doing so well- Ardine Bjork will wrap the leg again for you  Recommended follow up: Return in about 6 months (around 07/07/2023) for physical or sooner if needed.Schedule b4 you leave.

## 2023-01-06 NOTE — Progress Notes (Signed)
Phone [phone] In person visit   Subjective:   Belinda Day is a 77 y.o. year old very pleasant female patient who presents for/with See problem oriented charting Chief Complaint  Patient presents with   Medical Management of Chronic Issues   Hypertension   Fall    Pt had a fall after being chased by yellow jackets on 08/28/22.   Past Medical History-  Patient Active Problem List   Diagnosis Date Noted   Type 2 diabetes mellitus without complication, without long-term current use of insulin (HCC) 09/07/2017    Priority: High   Osteoporosis 08/14/2011    Priority: High   CAD (coronary artery disease) s/p CABG 07/03/2007    Priority: High   Aortic atherosclerosis (HCC) 05/28/2020    Priority: Medium    Hyperglycemia 08/05/2014    Priority: Medium    Insomnia 02/12/2013    Priority: Medium    Nocturnal hypoxemia 12/12/2012    Priority: Medium    Fibromyalgia 12/28/2011    Priority: Medium    Hypothyroidism 04/06/2010    Priority: Medium    MAI (mycobacterium avium-intracellulare) (HCC) 11/19/2009    Priority: Medium    Hyperlipemia 07/03/2007    Priority: Medium    Essential hypertension 07/03/2007    Priority: Medium    Obstructive bronchiectasis (HCC) with GOLD II/III criteria 07/03/2007    Priority: Medium    Cystitis 11/05/2016    Priority: Low   Former smoker 08/05/2014    Priority: Low   Constipation 05/02/2014    Priority: Low   History of colonic polyps 05/02/2014    Priority: Low   GERD (gastroesophageal reflux disease) 02/24/2014    Priority: Low   Hemoptysis 02/24/2014    Priority: Low   Benign paroxysmal positional vertigo 04/18/2013    Priority: Low   Diverticulitis 12/21/2012    Priority: Low   Medication management 10/21/2022   Cellulitis of left lower extremity 10/13/2022   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 1 each 2   alendronate (FOSAMAX) 70 MG tablet Take 1 tablet (70 mg total) by mouth every 7 (seven) days. Take with a full glass of water on an empty stomach. 5 tablet 11   ALPRAZolam (XANAX) 0.25 MG tablet TAKE 1 TABLET BY MOUTH AT BEDTIME AS NEEDED FOR SLEEP. TAKE SPARINGLY FOR DAYTIME ANXIETY, 8 HOURS FROM NIGHTTIME DOSE. DO NOT DRIVE FOR 8 HOURS 95 tablet 0   amitriptyline (ELAVIL) 50 MG tablet Take 1 tablet (50 mg total) by mouth at bedtime. 90 tablet 3   amLODipine (NORVASC) 5 MG tablet Take 1 tablet (5 mg total) by mouth daily. 90 tablet 3   aspirin EC 81 MG tablet Take 81 mg by mouth daily.     atorvastatin (LIPITOR) 80 MG tablet TAKE 1 TABLET(80 MG) BY MOUTH DAILY 90 tablet 1   azithromycin (ZITHROMAX) 250 MG tablet Take by mouth daily.     calcium carbonate (OS-CAL - DOSED IN MG OF ELEMENTAL CALCIUM) 1250 (500 Ca) MG tablet Take 1 tablet by mouth daily.     cholecalciferol (VITAMIN D3) 25 MCG (1000 UNIT) tablet Take  1,000 Units by mouth daily.     Coenzyme Q10 (COQ10) 200 MG CAPS Take 200 mg by mouth daily.     cyanocobalamin (VITAMIN B12) 1000 MCG tablet Take 1,000 mcg by mouth daily.     Melatonin 5 MG TABS Take 5 mg by mouth at bedtime.     metFORMIN (GLUCOPHAGE) 500 MG tablet TAKE 1 TABLET(500 MG) BY MOUTH TWICE DAILY WITH A MEAL 180 tablet 3   mometasone-formoterol (DULERA) 200-5 MCG/ACT AERO INHALE 2 PUFFS INTO THE LUNGS EVERY MORNING AND EVERY NIGHT AT BEDTIME 13 g 11   Multiple Vitamin (MULTIVITAMIN) tablet Take 1 tablet by mouth daily.     nebivolol (BYSTOLIC) 2.5 MG tablet TAKE 1 TABLET(2.5 MG) BY MOUTH DAILY 90 tablet 3   Respiratory Therapy Supplies (FLUTTER) DEVI Use as directed 1 each 0    SYNTHROID 75 MCG tablet TAKE 1 TABLET(75 MCG) BY MOUTH DAILY BEFORE AND BREAKFAST 90 tablet 3   telmisartan (MICARDIS) 80 MG tablet TAKE 1 TABLET(80 MG) BY MOUTH DAILY 90 tablet 3   HYDROcodone-acetaminophen (NORCO) 5-325 MG tablet Take 1 tablet by mouth every 6 (six) hours as needed for moderate pain. (Patient not taking: Reported on 01/06/2023) 20 tablet 0   ondansetron (ZOFRAN) 4 MG tablet 1 tablet by mouth 30 mins before drinking Suprep ( colonscopy preparation) (Patient not taking: Reported on 01/06/2023) 3 tablet 0   No current facility-administered medications for this visit.     Objective:  BP 122/70   Temp (!) 97.3 F (36.3 C)   Ht 5\' 1"  (1.549 m)   Wt 113 lb 3.2 oz (51.3 kg)   BMI 21.39 kg/m  Gen: NAD, resting comfortably CV: RRR no murmurs rubs or gallops Lungs: coarse breath sounds and some wheeze- her baseline Ext: no edema Skin: warm, dry    Assessment and Plan    #Fall/laceratoin/cellulitis- chased by yellow jackets in August and  had laceration after falling off ladder- ended up with prolonged issues with cellulitis and wound - seeing inefectious disease and eventually dermatology as also with squamous cell skin cancer in the area and still now ongoing visits with wound care- but going in right direction with weekly visits - hoping to go every other week next year -still has 10 tablets let if has procedures from Dr. Mardelle Matte -appears to be 1.5 x 2 cm perhaps- team will redress -has had to use more tylenol so wants to check liver  #colonoscopy on Monday- had sugar drop and plan is for 3 year repeat possibly due to adeonma history- will reassess health at that time  #Frequent headache(s)- wanted to try Flonase last visit- opted out of MRI and thankfully symptoms resolved  #Fibromyalgia/insomnia S: Compliant with amitriptyline 50 mg (had worsening symptoms on lower dose in past by prior PCP)-due to age, her pharmacist has mentioned coming off amitriptyline but she has  been on since 1988 and prefers to continue.  Uses Tylenol as needed. -Uses Xanax to help with sleep (fall was in afternoon and related to yellow jackets so we will hold off)   Very difficult to fall asleep if she does not take this.  Fibromyalgia symptoms worsen with poor sleep A/P: fibromyalgia and insomnia reasonably controlled- some risks but continue current medications with high level quality of life benefit   % Pulmonary-MAI and obstructive bronchiectasis-follows with Dr. Sherene Sires S: Patient follows with pulmonary clinic.  On regular Dulera.  Albuterol as needed- very rare.  Uses azithromycin daily A/P: overall stable- continue current medications    % #  CAD status post CABG-follows with Dr. Meda Klinefelter S: Compliant with atorvastatin 80 mg and aspirin 81 mg.  LDL goal under 70 -no chest pain. Stable shortness of breath  Lab Results  Component Value Date   CHOL 127 06/30/2022   HDL 65.30 06/30/2022   LDLCALC 43 06/30/2022   LDLDIRECT 51.0 11/28/2020   TRIG 90.0 06/30/2022   CHOLHDL 2 06/30/2022  A/P: coronary artery disease largely asymptomatic continue current medications  Lipids at goal last check- she prefers full panel- ordreed today    #Hypertension S: Compliant with Bystolic 2.5 mg, telmisartan 80 mg, amlodipine 5 mg BP Readings from Last 3 Encounters:  01/06/23 122/70  01/03/23 (!) 140/53  11/05/22 132/74  A/P: stable- continue current medicines    #Hypothyroidism S: Compliant with Synthroid 75 mcg Lab Results  Component Value Date   TSH 1.18 06/30/2022  A/P: hopefully stable- update tsh today. Continue current meds for now      #Diabetes-new diagnosis 12/02/2021 with A1c 6.6 already on metformin S: Medication:  Metformin 500 mg twice a day. Lab Results  Component Value Date   HGBA1C 5.9 06/30/2022   HGBA1C 6.6 (H) 12/02/2021   HGBA1C 6.3 06/01/2021  A/P: hopefully stable- update a1c today. Continue current meds for now     #Osteoporosis S: Takes  calcium and vitamin D alone and all areas were stable-"02/16/17 DEXA. -2.2 at R femur neck-23% 10 year risk and hip fracture risk 12%.".  Numbers improved by 0.1 on femur necks in 2021 - in 2024 in July femur necks -2.9. sporadic on calcium and vitamin D and less active due to leg lately A/P: osteoporosis noted- start fosamax this week and restart calcium and vitamin D   Recommended follow up: Return in about 6 months (around 07/07/2023) for physical or sooner if needed.Schedule b4 you leave. Future Appointments  Date Time Provider Department Center  01/11/2023  1:30 PM Glenford Bayley, NP [facility] None  01/12/2023  9:45 AM Maxwell Caul, MD [facility]    Lab/Order associations:   ICD-10-CM   1. Type 2 diabetes mellitus without complication, without long-term current use of insulin (HCC)  E11.9 Hemoglobin A1c    Comprehensive metabolic panel    CBC with Differential/Platelet    2. Essential hypertension  I10 Comprehensive metabolic panel    CBC with Differential/Platelet    3. Hyperlipidemia, unspecified hyperlipidemia type  E78.5 Comprehensive metabolic panel    CBC with Differential/Platelet    Lipid panel    4. Hypothyroidism, unspecified type  E03.9 TSH      Meds ordered this encounter  Medications   alendronate (FOSAMAX) 70 MG tablet    Sig: Take 1 tablet (70 mg total) by mouth every 7 (seven) days. Take with a full glass of water on an empty stomach.    Dispense:  5 tablet    Refill:  11    Return precautions advised.  Tana Conch, MD

## 2023-01-06 NOTE — Progress Notes (Addendum)
AYEESHA, CONRADI A ([id number]) [id number] Page 1 of 6 Visit Report for 01/05/2023 HPI Details Patient Name: Date of Service: Belinda Day, NODAL A. 01/05/2023 9:00 A M Medical Record Number: [id number] Patient Account Number: [id number] Date of Birth/Sex: Treating RN: 05/17/1945 (77 y.o. F) Primary Care Provider: Tana Conch Other Clinician: Referring Provider: Treating Provider/Extender: Jaquelyn Bitter in Treatment: 10 History of Present Illness HPI Description: 10/27/2022 upon evaluation today patient presents for initial inspection here in the clinic regarding a wound over the left anterior lower extremity. T back up a little bit and gives some history I am going to recount what the patient brought in written out on her sheet with her today which is an o excellent history of the course of this wound. On August 3 the patient sustained an injury to the left leg due to a fall off of a ladder. This was essentially a deep cut and continue to bleed for about 4 days. On August 7 she saw Dr. Lenn Cal at [facility] and was started on Keflex at that point. Subsequently on August 14 she went back to [facility] where she was placed on doxycycline 100 mg for 2 rounds. The next time that she was evaluated was actually about 3 weeks later when back at [facility] September 6 she was sent to the hospital due to what appeared to be worsening and started on vancomycin IV. September 7 she was actually discharged from the hospital with linezolid 600 mg. On September 18 she saw Dr. Anselm Jungling who recommended the patient go to [facility] long the next morning. On September 19 she saw infectious disease and they ordered an MRI as well as putting the patient on Dalvance. This was by IV. The next appointment with infectious disease was September 26 at that point it was noted that the patient had a number MRI and the results of that were  reported out on 10-14-2022 which showed that she had soft tissue swelling of the distal lower leg with focal skin thickening and enhancement anteriorly consistent with cellulitis no abscess. There was also a thin periosteal edema and enhancement along the anterior medial aspect of the distal tibia this is stated to be likely reactive no definitive osteomyelitis noted. Subsequently she states that she did follow-up requesting to go to the wound center for further evaluation and was referred to [place] today October 2 for evaluation in the meantime she has been using Silvadene cream. Her most recent hemoglobin A1c was 6.6 she does have diabetes but otherwise no major medical problems other than hypertension and COPD that would affect her ability to heal. 11-03-2022 upon evaluation today patient appears to be doing okay currently in regard to her wound. I did actually perform a biopsy last week and came back positive for squamous cell carcinoma. With that being said I discussed with the patient that she is likely can require a full excision of this area and I recommended that she may need to go to the skin surgery center unless we get her in with her dermatologist sooner. I am not sure if they have a Mohs surgeon at [facility] dermatology or not. With that being said we will get a see about get in touch with them and finding out which I think will definitely be of benefit if so because it could be something when she gets in much faster. 11-17-2022 upon evaluation today patient presents for follow-up I last saw her on October 9 since  then she actually have the mobile skin surgery on November 15, 2022 that she has been 2 days ago. This actually went very well the area that they had to remove was not nearly as large as I was fearing this is also and excellent. With that being said I do believe the patient is actually making progress here already towards seeing this improvement although again it is much too early to  tell that it is actually healing currently. There is a lot of bleeding we want a make sure that this does not turn into just a blood clot or harder scabs on the use Xeroform gauze at this point helps keep this soft over the next week we will see where things stand following. I am very pleased however they were able to get her in so clinically that is awesome and I do appreciate that. 11-24-2022 upon evaluation patient's wound is showing signs of improvement this does seem to be improving as far as the overall appearance of the wound is concerned. I feel like that it is slowly clearing up to a good surface underneath were not quite there yet but at the same time I think Xeroform is doing a good job. 11/6; a wound that was initially felt to be traumatic however a biopsy of part of this showed squamous cell carcinoma. She underwent Mohs surgery on October 21. We were seeing her for the postop wound care. Apparently the margins of this were clear. She has been using Xeroform and ZZetuvit 11/13; Mohs surgery site. Xeroform and gauze. Comes in the clinic with a nonviable surface. 11/20; Mohs surgery sites. I changed her last week to [facility] and gauze. We have been trying to order her compression stockings but running into trouble with [employer] 12/11; Mohs surgery site on the left leg. This is on the anterior left leg. We have been using Hydrofera Blue and foam she is changing this daily. She missed appointments because of issues related to hypoglycemia while she was having a colonoscopy. We do not have any more information about her compression stockings through [employer] Electronic Signature(s) Signed: 01/05/2023 5:18:28 PM By: Baltazar Najjar MD Entered By: Baltazar Najjar on 01/05/2023 09:25:26 -------------------------------------------------------------------------------- Physical Exam Details Patient Name: Date of Service: Belinda Day RO N A. 01/05/2023 9:00 A Charlesetta Garibaldi,  Jasmine December A ([id number]) [phone].pdf Page 2 of 6 Medical Record Number: [id number] Patient Account Number: [id number] Date of Birth/Sex: Treating RN: 1945/12/27 (77 y.o. F) Primary Care Provider: Tana Conch Other Clinician: Referring Provider: Treating Provider/Extender: Jaquelyn Bitter in Treatment: 10 Constitutional Sitting or standing Blood Pressure is within target range for patient.. Pulse regular and within target range for patient.Marland Kitchen Respirations regular, non-labored and within target range.. Temperature is normal and within the target range for the patient.Marland Kitchen Appears in no distress. Notes Wound exam; left anterior lower leg. The surface here looks quite a bit better. Overall wound dimensions are improved. No mechanical debridement is necessary. No evidence of surrounding infection Electronic Signature(s) Signed: 01/05/2023 5:18:28 PM By: Baltazar Najjar MD Entered By: Baltazar Najjar on 01/05/2023 02:72:53 -------------------------------------------------------------------------------- Physician Orders Details Patient Name: Date of Service: Belinda Day RO N A. 01/05/2023 9:00 A M Medical Record Number: [id number] Patient Account Number: [id number] Date of Birth/Sex: Treating RN: [birth year] (77 y.o. Katrinka Blazing Primary Care Provider: Tana Conch Other Clinician: Referring Provider: Treating Provider/Extender: Jaquelyn Bitter in Treatment: 10 Verbal / Phone Orders: No Diagnosis Coding Follow-up Appointments ppointment in 1 week. -  Dr. Leanord Hawking 01/12/23 9:45am Return A (then have appts. every other week) Anesthetic (In clinic) Topical Lidocaine 5% applied to wound bed Bathing/ Shower/ Hygiene May shower and wash wound with soap and water. - Keep bandage on when in shower, at the end of shower remove dressing and wash with dial Antibacterial soap. After shower, gentle pat dry and apply Xeroform and  form border dressing Additional Orders / Instructions Other: - Punch Biopsy sent to [facility] labs. (10/27/22)- Done Wound Treatment Wound #1 - Lower Leg Wound Laterality: Left, Anterior Cleanser: Soap and Water 1 x Per Day/30 Days Discharge Instructions: May shower and wash wound with dial antibacterial soap and water prior to dressing change. Cleanser: Vashe 5.8 (oz) 1 x Per Day/30 Days Discharge Instructions: Cleanse the wound with Vashe prior to applying a clean dressing using gauze sponges, not tissue or cotton balls. Prim Dressing: Hydrofera Blue Ready Transfer Foam, 2.5x2.5 (in/in) 1 x Per Day/30 Days ary Discharge Instructions: Apply directly to wound bed as directed Secondary Dressing: Woven Gauze Sponge, Non-Sterile 4x4 in 1 x Per Day/30 Days Discharge Instructions: Apply over primary dressing as directed. Secured With: Insurance underwriter, Sterile 2x75 (in/in) 1 x Per Day/30 Days Discharge Instructions: Secure with stretch gauze as directed. Secured With: 44M Med[employer] Scientist, research (life sciences) Surgical T 2x10 (in/yd) 1 x Per Day/30 Days ape Discharge Instructions: Secure with tape as directed. Electronic Signature(s) SITA, MCGUINESS A ([id number]) [id number] Page 3 of 6 Signed: 01/05/2023 12:29:48 PM By: Karie Schwalbe RN Signed: 01/05/2023 5:18:28 PM By: Baltazar Najjar MD Entered By: Karie Schwalbe on 01/05/2023 09:17:00 -------------------------------------------------------------------------------- Problem List Details Patient Name: Date of Service: Belinda Day RO N A. 01/05/2023 9:00 A M Medical Record Number: [id number] Patient Account Number: [id number] Date of Birth/Sex: Treating RN: 1945/02/21 (77 y.o. F) Primary Care Provider: Tana Conch Other Clinician: Referring Provider: Treating Provider/Extender: Jaquelyn Bitter in Treatment: 10 Active Problems ICD-10 Encounter Code Description Active Date  MDM Diagnosis [phone] Laceration without foreign body, left lower leg, initial encounter 10/27/2022 No Yes L03.116 Cellulitis of left lower limb 10/27/2022 No Yes E11.622 Type 2 diabetes mellitus with other skin ulcer 10/27/2022 No Yes L97.822 Non-pressure chronic ulcer of other part of left lower leg with fat layer exposed10/02/2022 No Yes I10 Essential (primary) hypertension 10/27/2022 No Yes J44.89 Other specified chronic obstructive pulmonary disease 10/27/2022 No Yes Inactive Problems Resolved Problems Electronic Signature(s) Signed: 01/05/2023 5:18:28 PM By: Baltazar Najjar MD Entered By: Baltazar Najjar on 01/05/2023 09:23:58 -------------------------------------------------------------------------------- Progress Note Details Patient Name: Date of Service: Belinda Day RO N A. 01/05/2023 9:00 A M Medical Record Number: [id number] Patient Account Number: [id number] Date of Birth/Sex: Treating RN: 02/15/1945 (77 y.o. F) Primary Care Provider: Tana Conch Other Clinician: Referring Provider: Treating Provider/Extender: Jaquelyn Bitter in Treatment: [address] [place] A ([id number]) [id number] Page 4 of 6 Subjective History of Present Illness (HPI) 10/27/2022 upon evaluation today patient presents for initial inspection here in the clinic regarding a wound over the left anterior lower extremity. T back up a o little bit and gives some history I am going to recount what the patient brought in written out on her sheet with her today which is an excellent history of the course of this wound. On August 3 the patient sustained an injury to the left leg due to a fall off of a ladder. This was essentially a deep cut and continue to bleed for about 4 days. On August 7 she saw Dr. Lenn Cal at [facility] and was started on Keflex at that point. Subsequently on August 14 she went back to [facility] where she was placed on  doxycycline 100 mg for 2 rounds. The next time that she was evaluated was actually about 3 weeks later when back at [facility] September 6 she was sent to the hospital due to what appeared to be worsening and started on vancomycin IV. September 7 she was actually discharged from the hospital with linezolid 600 mg. On September 18 she saw Dr. Anselm Jungling who recommended the patient go to [facility] long the next morning. On September 19 she saw infectious disease and they ordered an MRI as well as putting the patient on Dalvance. This was by IV. The next appointment with infectious disease was September 26 at that point it was noted that the patient had a number MRI and the results of that were reported out on 10-14-2022 which showed that she had soft tissue swelling of the distal lower leg with focal skin thickening and enhancement anteriorly consistent with cellulitis no abscess. There was also a thin periosteal edema and enhancement along the anterior medial aspect of the distal tibia this is stated to be likely reactive no definitive osteomyelitis noted. Subsequently she states that she did follow-up requesting to go to the wound center for further evaluation and was referred to [place] today October 2 for evaluation in the meantime she has been using Silvadene cream. Her most recent hemoglobin A1c was 6.6 she does have diabetes but otherwise no major medical problems other than hypertension and COPD that would affect her ability to heal. 11-03-2022 upon evaluation today patient appears to be doing okay currently in regard to her wound. I did actually perform a biopsy last week and came back positive for squamous cell carcinoma. With that being said I discussed with the patient that she is likely can require a full excision of this area and I recommended that she may need to go to the skin surgery center unless we get her in with her dermatologist sooner. I am not sure if they have a Mohs surgeon at  [facility] dermatology or not. With that being said we will get a see about get in touch with them and finding out which I think will definitely be of benefit if so because it could be something when she gets in much faster. 11-17-2022 upon evaluation today patient presents for follow-up I last saw her on October 9 since then she actually have the mobile skin surgery on November 15, 2022 that she has been 2 days ago. This actually went very well the area that they had to remove was not nearly as large as I was fearing this is also and excellent. With that being said I do believe the patient is actually making progress here already towards seeing this improvement although again it is much too early to tell that it is actually healing currently. There is a lot of bleeding we want a make sure that this does not turn into just a blood clot or harder scabs on the use Xeroform gauze at this point helps keep this soft over the next week we will see where things stand following. I am very pleased however they were able to get her in so clinically that is awesome and I do appreciate that. 11-24-2022 upon evaluation patient's wound is showing signs of improvement this does seem to be improving as far as the overall appearance of the wound is concerned.  I feel like that it is slowly clearing up to a good surface underneath were not quite there yet but at the same time I think Xeroform is doing a good job. 11/6; a wound that was initially felt to be traumatic however a biopsy of part of this showed squamous cell carcinoma. She underwent Mohs surgery on October 21. We were seeing her for the postop wound care. Apparently the margins of this were clear. She has been using Xeroform and ZZetuvit 11/13; Mohs surgery site. Xeroform and gauze. Comes in the clinic with a nonviable surface. 11/20; Mohs surgery sites. I changed her last week to [facility] and gauze. We have been trying to order her compression stockings  but running into trouble with [employer] 12/11; Mohs surgery site on the left leg. This is on the anterior left leg. We have been using Hydrofera Blue and foam she is changing this daily. She missed appointments because of issues related to hypoglycemia while she was having a colonoscopy. We do not have any more information about her compression stockings through [employer] Objective Constitutional Sitting or standing Blood Pressure is within target range for patient.. Pulse regular and within target range for patient.Marland Kitchen Respirations regular, non-labored and within target range.. Temperature is normal and within the target range for the patient.Marland Kitchen Appears in no distress. Vitals Time Taken: 8:50 AM, Height: 61 in, Weight: 113 lbs, BMI: 21.3, Temperature: 98.3 F, Pulse: 75 bpm, Respiratory Rate: 16 breaths/min, Blood Pressure: 125/77 mmHg. General Notes: Wound exam; left anterior lower leg. The surface here looks quite a bit better. Overall wound dimensions are improved. No mechanical debridement is necessary. No evidence of surrounding infection Integumentary (Hair, Skin) Wound #1 status is Open. Original cause of wound was Skin T ear/Laceration. The date acquired was: 08/28/2022. The wound has been in treatment 10 weeks. The wound is located on the Left,Anterior Lower Leg. The wound measures 1.5cm length x 1.9cm width x 0.1cm depth; 2.238cm^2 area and 0.224cm^3 volume. There is Fat Layer (Subcutaneous Tissue) exposed. There is no tunneling or undermining noted. There is a small amount of serosanguineous drainage noted. The wound margin is distinct with the outline attached to the wound base. There is large (67-100%) red granulation within the wound bed. There is a small (1-33%) amount of necrotic tissue within the wound bed including Eschar and Adherent Slough. The periwound skin appearance did not exhibit: Callus, Crepitus, Excoriation, Induration, Rash, Scarring, Dry/Scaly, Maceration,  Atrophie Blanche, Cyanosis, Ecchymosis, Hemosiderin Staining, Mottled, Pallor, Rubor, Erythema. Periwound temperature was noted as No Abnormality. Assessment Active Problems TAYAH, DANSER A ([id number]) [id number] Page 5 of 6 ICD-10 Laceration without foreign body, left lower leg, initial encounter Cellulitis of left lower limb Type 2 diabetes mellitus with other skin ulcer Non-pressure chronic ulcer of other part of left lower leg with fat layer exposed Essential (primary) hypertension Other specified chronic obstructive pulmonary disease Plan Follow-up Appointments: Return Appointment in 1 week. - Dr. Leanord Hawking 01/12/23 9:45am (then have appts. every other week) Anesthetic: (In clinic) Topical Lidocaine 5% applied to wound bed Bathing/ Shower/ Hygiene: May shower and wash wound with soap and water. - Keep bandage on when in shower, at the end of shower remove dressing and wash with dial Antibacterial soap. After shower, gentle pat dry and apply Xeroform and form border dressing Additional Orders / Instructions: Other: - Punch Biopsy sent to [facility] labs. (10/27/22)- Done WOUND #1: - Lower Leg Wound Laterality: Left, Anterior Cleanser: Soap and Water 1 x Per Day/30 Days  Discharge Instructions: May shower and wash wound with dial antibacterial soap and water prior to dressing change. Cleanser: Vashe 5.8 (oz) 1 x Per Day/30 Days Discharge Instructions: Cleanse the wound with Vashe prior to applying a clean dressing using gauze sponges, not tissue or cotton balls. Prim Dressing: Hydrofera Blue Ready Transfer Foam, 2.5x2.5 (in/in) 1 x Per Day/30 Days ary Discharge Instructions: Apply directly to wound bed as directed Secondary Dressing: Woven Gauze Sponge, Non-Sterile 4x4 in 1 x Per Day/30 Days Discharge Instructions: Apply over primary dressing as directed. Secured With: Insurance underwriter, Sterile 2x75 (in/in) 1 x Per Day/30 Days Discharge  Instructions: Secure with stretch gauze as directed. Secured With: 20M Medipore Scientist, research (life sciences) Surgical T 2x10 (in/yd) 1 x Per Day/30 Days ape Discharge Instructions: Secure with tape as directed. 1. We are making nice progress Hydrofera Blue and a border foam the patient is changing this every day 2. She asks about more than a 1 week hiatus between appointments. I told her when we see her next week if this is still smaller I think we could put her about 2 weeks Electronic Signature(s) Signed: 01/05/2023 5:18:28 PM By: Baltazar Najjar MD Entered By: Baltazar Najjar on 01/05/2023 09:55:14 -------------------------------------------------------------------------------- SuperBill Details Patient Name: Date of Service: Belinda Day RO N A. 01/05/2023 Medical Record Number: [id number] Patient Account Number: [id number] Date of Birth/Sex: Treating RN: October 05, 1945 (77 y.o. F) Primary Care Provider: Tana Conch Other Clinician: Referring Provider: Treating Provider/Extender: Jaquelyn Bitter in Treatment: 10 Diagnosis Coding ICD-10 Codes Code Description [phone] Laceration without foreign body, left lower leg, initial encounter L03.116 Cellulitis of left lower limb E11.622 Type 2 diabetes mellitus with other skin ulcer L97.822 Non-pressure chronic ulcer of other part of left lower leg with fat layer exposed I10 Essential (primary) hypertension J44.89 Other specified chronic obstructive pulmonary disease Facility Procedures EMIL, SAINT A ([id number]): CPT4 Code Description [id number] [phone] - WOUND CARE VISIT-LEV 3 EST PT [id number] Page 6 of 6: Modifier Quantity 1 Physician Procedures : CPT4 Code Description Modifier [phone] 99213 - WC PHYS LEVEL 3 - EST PT ICD-10 Diagnosis Description L97.822 Non-pressure chronic ulcer of other part of left lower leg with fat layer exposed E11.622 Type 2 diabetes mellitus with other skin ulcer Quantity:  1 Electronic Signature(s) Signed: 01/06/2023 9:29:08 AM By: Pearletha Alfred Signed: 01/06/2023 5:17:16 PM By: Baltazar Najjar MD Previous Signature: 01/05/2023 5:18:28 PM Version By: Baltazar Najjar MD Entered By: Pearletha Alfred on 01/06/2023 09:29:07

## 2023-01-11 ENCOUNTER — Ambulatory Visit: Payer: Medicare Other | Admitting: Primary Care

## 2023-01-11 ENCOUNTER — Encounter: Payer: Self-pay | Admitting: Primary Care

## 2023-01-11 VITALS — BP 128/70 | HR 81 | Temp 98.0°F | Ht 61.0 in | Wt 111.6 lb

## 2023-01-11 DIAGNOSIS — J479 Bronchiectasis, uncomplicated: Secondary | ICD-10-CM | POA: Diagnosis not present

## 2023-01-11 DIAGNOSIS — R1310 Dysphagia, unspecified: Secondary | ICD-10-CM

## 2023-01-11 DIAGNOSIS — R053 Chronic cough: Secondary | ICD-10-CM

## 2023-01-11 MED ORDER — DULERA 200-5 MCG/ACT IN AERO: 2.0000 | INHALATION_SPRAY | Freq: Two times a day (BID) | RESPIRATORY_TRACT | 11 refills | Status: DC

## 2023-01-11 MED ORDER — AZITHROMYCIN 250 MG PO TABS: 250.0000 mg | ORAL_TABLET | Freq: Every day | ORAL | 5 refills | Status: DC

## 2023-01-11 NOTE — Progress Notes (Signed)
@Patient  ID: Belinda Day, female    DOB: March 20, 1945, 77 y.o.   MRN: [id number]  No chief complaint on file.   Referring provider: Shelva Majestic, MD  HPI: 77 year old female.  Past medical history significant for hypertension, obstructive bronchiectasis, MAI, nocturnal hypoxemia, hypertension, cerebrovascular disease, coronary artery disease status post CABG, GERD, hypothyroidism, type 2 diabetes, osteoporosis.   01/11/2023- Interim hx  Discussed the use of AI scribe software for clinical note transcription with the patient, who gave verbal consent to proceed.  History of Present Illness   The patient, with a history of obstructive bronchiectasis and COPD, presents for a follow-up visit. She has a history of smoking, which she quit in 1972, and her bronchiectasis was first identified on a CT scan in 2002. The patient also has a history of COPD, diagnosed in 2012.  The patient's primary symptom is a chronic cough, which is particularly pronounced in the morning and after eating. She reports coughing up yellowish mucus, which she identifies as her baseline. She does not report any shortness of breath or limitations in her breathing. The patient uses a flutter valve as needed to manage her cough and takes Mucinex DM daily. She also takes Dulera twice daily and azithromycin once daily, which she has been on for several years.  In addition to her lung conditions, the patient has been dealing with a complicated left ankle injury since a fall in August. She has been on several rounds of antibiotics and is currently receiving weekly treatment at a wound care center.  During a recent colonoscopy, the patient experienced a drop in blood sugar and was asked about potential sleep apnea due to her breathing patterns upon waking from anesthesia. However, she reports no known symptoms of sleep apnea, such as snoring or daytime fatigue.  The patient's lung conditions appear stable, with no  significant acute findings on a recent chest x-ray. She reports no changes in her cough or the amount of mucus she is coughing up. She has not had a cold or the flu in the past three years, which she attributes to her daily azithromycin.       Allergies  Allergen Reactions   Bactrim [Sulfamethoxazole-Trimethoprim] Hives, Itching and Other (See Comments)    Bruised like areas on body   Ciprofloxacin Other (See Comments)    Body aches   Codeine Nausea Only    REACTION: nausea   Erythromycin Nausea Only    REACTION: nausea   Levaquin [Levofloxacin] Other (See Comments)    REACTION: aches   Tramadol Nausea And Vomiting    Immunization History  Administered Date(s) Administered   Fluad Quad(high Dose 65+) 10/28/2020, 10/26/2021, 10/29/2022   H1N1 01/02/2008   Influenza Split 10/14/2010, 10/07/2011   Influenza, High Dose Seasonal PF 11/05/2016, 10/28/2020, 11/09/2022   Influenza,inj,Quad PF,6+ Mos 10/02/2012, 10/16/2015, 11/07/2017, 10/05/2018, 10/30/2019   Influenza-Unspecified 09/25/2013, 10/15/2014, 11/07/2017   Moderna Covid-19 Vaccine Bivalent Booster 43yrs & up 11/14/2020   Moderna Sars-Covid-2 Vaccination 02/09/2019, 03/12/2019, 09/05/2019, 05/12/2020   PFIZER Comirnaty(Gray Top)Covid-19 Tri-Sucrose Vaccine 11/12/2021   PNEUMOCOCCAL CONJUGATE-20 08/27/2020   Pfizer(Comirnaty)Fall Seasonal Vaccine 12 years and older 10/10/2022   Pneumococcal Conjugate-13 08/14/2013   Pneumococcal Polysaccharide-23 10/29/2011   Respiratory Syncytial Virus Vaccine,Recomb Aduvanted(Arexvy) 10/27/2021   Td 05/15/2019   Unspecified SARS-COV-2 Vaccination 10/29/2022   Zoster Recombinant(Shingrix) 06/25/2017, 09/30/2017    Past Medical History:  Diagnosis Date   Bronchiectasis    oxygen at night in the past   CAD (coronary artery disease)  Cellulitis    COPD (chronic obstructive pulmonary disease) (HCC) bronchiectasis   Diabetes mellitus without complication (HCC) pre-diabetes    Diverticulitis 01/26/2012   GERD (gastroesophageal reflux disease)    History of shingles 04/25/2012   HTN (hypertension)    Hyperlipidemia    Hypothyroidism    MAI (mycobacterium avium-intracellulare) (HCC)    Medication management 10/21/2022   Neuritis of upper extremity    Osteoporosis 08/14/2011    02/16/17 discuss DEXA. 02.2 at R femur neck23% 10 year risk and hip fracture risk 12%. but #s were elevated  Only takes vitamin D and essentially all areas evaluated on bone density were stable from 2016 to 2018. We opted with the stability to not start fosamax and repeat in 12/2018 or 01/2019. SOLIS DEXA  Stable. vitamin D  [facility] DEXA. L femur neck -2.1 in 2016 after -2.2 11/11/08. AP tota*   Substance abuse (HCC)     Tobacco History: Social History   Tobacco Use  Smoking Status Former   Current packs/day: 0.00   Average packs/day: 1 pack/day for 25.0 years (25.0 ttl pk-yrs)   Types: Cigarettes   Start date: 01/26/1955   Quit date: 01/26/1980   Years since quitting: 42.9  Smokeless Tobacco Never   Counseling given: Not Answered   Outpatient Medications Prior to Visit  Medication Sig Dispense Refill   albuterol (PROAIR HFA) 108 (90 Base) MCG/ACT inhaler Inhale 2 puffs into the lungs every 6 (six) hours as needed for wheezing or shortness of breath. 1 each 2   alendronate (FOSAMAX) 70 MG tablet Take 1 tablet (70 mg total) by mouth every 7 (seven) days. Take with a full glass of water on an empty stomach. 5 tablet 11   ALPRAZolam (XANAX) 0.25 MG tablet TAKE 1 TABLET BY MOUTH AT BEDTIME AS NEEDED FOR SLEEP. TAKE SPARINGLY FOR DAYTIME ANXIETY, 8 HOURS FROM NIGHTTIME DOSE. DO NOT DRIVE FOR 8 HOURS 95 tablet 0   amitriptyline (ELAVIL) 50 MG tablet Take 1 tablet (50 mg total) by mouth at bedtime. 90 tablet 3   amLODipine (NORVASC) 5 MG tablet Take 1 tablet (5 mg total) by mouth daily. 90 tablet 3   aspirin EC 81 MG tablet Take 81 mg by mouth daily.     atorvastatin (LIPITOR) 80 MG tablet  TAKE 1 TABLET(80 MG) BY MOUTH DAILY 90 tablet 1   azithromycin (ZITHROMAX) 250 MG tablet Take by mouth daily.     calcium carbonate (OS-CAL - DOSED IN MG OF ELEMENTAL CALCIUM) 1250 (500 Ca) MG tablet Take 1 tablet by mouth daily.     cholecalciferol (VITAMIN D3) 25 MCG (1000 UNIT) tablet Take 1,000 Units by mouth daily.     Coenzyme Q10 (COQ10) 200 MG CAPS Take 200 mg by mouth daily.     cyanocobalamin (VITAMIN B12) 1000 MCG tablet Take 1,000 mcg by mouth daily.     HYDROcodone-acetaminophen (NORCO) 5-325 MG tablet Take 1 tablet by mouth every 6 (six) hours as needed for moderate pain. (Patient not taking: Reported on 01/06/2023) 20 tablet 0   Melatonin 5 MG TABS Take 5 mg by mouth at bedtime.     metFORMIN (GLUCOPHAGE) 500 MG tablet TAKE 1 TABLET(500 MG) BY MOUTH TWICE DAILY WITH A MEAL 180 tablet 3   mometasone-formoterol (DULERA) 200-5 MCG/ACT AERO INHALE 2 PUFFS INTO THE LUNGS EVERY MORNING AND EVERY NIGHT AT BEDTIME 13 g 11   Multiple Vitamin (MULTIVITAMIN) tablet Take 1 tablet by mouth daily.     nebivolol (BYSTOLIC) 2.5 MG  tablet TAKE 1 TABLET(2.5 MG) BY MOUTH DAILY 90 tablet 3   ondansetron (ZOFRAN) 4 MG tablet 1 tablet by mouth 30 mins before drinking Suprep ( colonscopy preparation) (Patient not taking: Reported on 01/06/2023) 3 tablet 0   Respiratory Therapy Supplies (FLUTTER) DEVI Use as directed 1 each 0   SYNTHROID 75 MCG tablet TAKE 1 TABLET(75 MCG) BY MOUTH DAILY BEFORE AND BREAKFAST 90 tablet 3   telmisartan (MICARDIS) 80 MG tablet TAKE 1 TABLET(80 MG) BY MOUTH DAILY 90 tablet 3   No facility-administered medications prior to visit.   Review of Systems  Review of Systems  Constitutional: Negative.   HENT: Negative.    Respiratory:  Positive for cough. Negative for shortness of breath and wheezing.   Psychiatric/Behavioral:  Negative for sleep disturbance.     Physical Exam  There were no vitals taken for this visit. Physical Exam Constitutional:      Appearance:  Normal appearance.  HENT:     Head: Normocephalic and atraumatic.     Mouth/Throat:     Mouth: Mucous membranes are moist.     Pharynx: Oropharynx is clear.  Cardiovascular:     Rate and Rhythm: Normal rate and regular rhythm.  Pulmonary:     Breath sounds: Wheezing and rhonchi present.  Musculoskeletal:        General: Normal range of motion.  Skin:    General: Skin is warm and dry.  Neurological:     General: No focal deficit present.     Mental Status: She is alert and oriented to person, place, and time. Mental status is at baseline.  Psychiatric:        Mood and Affect: Mood normal.        Behavior: Behavior normal.        Thought Content: Thought content normal.        Judgment: Judgment normal.      Lab Results:  CBC    Component Value Date/Time   WBC 5.9 01/06/2023 0924   RBC 3.85 (L) 01/06/2023 0924   HGB 12.5 01/06/2023 0924   HCT 37.5 01/06/2023 0924   PLT 277.0 01/06/2023 0924   MCV 97.3 01/06/2023 0924   MCH 31.6 10/14/2022 0832   MCHC 33.4 01/06/2023 0924   RDW 13.8 01/06/2023 0924   LYMPHSABS 1.0 01/06/2023 0924   MONOABS 0.4 01/06/2023 0924   EOSABS 0.0 01/06/2023 0924   BASOSABS 0.0 01/06/2023 0924    BMET    Component Value Date/Time   NA 138 01/06/2023 0924   NA 143 01/04/2019 1222   K 3.5 01/06/2023 0924   CL 103 01/06/2023 0924   CO2 27 01/06/2023 0924   GLUCOSE 89 01/06/2023 0924   BUN 12 01/06/2023 0924   BUN 12 01/04/2019 1222   CREATININE 0.75 01/06/2023 0924   CREATININE 0.98 (H) 11/27/2019 0950   CALCIUM 8.7 01/06/2023 0924   GFRNONAA >60 10/14/2022 0832   GFRNONAA 56 (L) 11/27/2019 0950   GFRAA 65 11/27/2019 0950    BNP    Component Value Date/Time   BNP 226.9 (H) 04/17/2015 1810    ProBNP No results found for: "PROBNP"  Imaging: No results found.   Assessment & Plan:   1. Obstructive bronchiectasis (HCC) (Primary) - SLP modified barium swallow; Future  2. Chronic cough - SLP modified barium swallow;  Future  3. Dysphagia, unspecified type - SLP modified barium swallow; Future     Bronchiectasis and COPD Chronic cough, primarily in the morning and after eating.  No shortness of breath or change in sputum color. Patient reports less execrations since being on daily azithromycin. Stable chest x-ray in August 2024. Current medications include Dulera 2 puffs BID, azithromycin daily, and Mucinex DM as needed. Flutter valve used as needed. Increase use of flutter valve to 2-3 times daily. Continue current medications. Order barium swallow to evaluate for possible aspiration.  Ankle Injury Ongoing treatment at wound care center following complicated left ankle injury in August 2024. Multiple rounds of antibiotics and surgical interventions. -Continue current treatment plan at wound care center.  General Health Maintenance Up to date on vaccinations including flu, COVID, and Prevnar. -Continue current vaccination schedule.  Follow-up in 4-6 months or sooner if there is a change in symptoms.     Glenford Bayley, NP 01/11/2023

## 2023-01-11 NOTE — Patient Instructions (Addendum)
-  BRONCHIECTASIS AND COPD: Bronchiectasis is a condition where the airways in the lungs become widened and scarred, leading to mucus build-up and infections. COPD is a chronic inflammatory lung disease that obstructs airflow from the lungs. Your chronic cough is stable, and there are no changes in your sputum color. We recommend increasing the use of your flutter valve to 2-3 times daily and continuing your current medications (Dulera, azithromycin, and Mucinex DM). We will also order a barium swallow test to check for possible aspiration.  -ANKLE INJURY: You have a complicated left ankle injury from a fall in August 2024, which is being treated at a wound care center. Continue with your current treatment plan at the wound care center.  -GENERAL HEALTH MAINTENANCE: You are up to date on your vaccinations, including flu, COVID, and Prevnar. Continue with your current vaccination schedule.  Follow-up: 4-6 months with Dr. Sherene Sires or sooner

## 2023-01-12 ENCOUNTER — Encounter (HOSPITAL_BASED_OUTPATIENT_CLINIC_OR_DEPARTMENT_OTHER): Payer: Medicare Other | Admitting: Internal Medicine

## 2023-01-12 DIAGNOSIS — L03116 Cellulitis of left lower limb: Secondary | ICD-10-CM | POA: Diagnosis not present

## 2023-01-12 DIAGNOSIS — I10 Essential (primary) hypertension: Secondary | ICD-10-CM | POA: Diagnosis not present

## 2023-01-12 DIAGNOSIS — E11622 Type 2 diabetes mellitus with other skin ulcer: Secondary | ICD-10-CM | POA: Diagnosis not present

## 2023-01-12 DIAGNOSIS — J4489 Other specified chronic obstructive pulmonary disease: Secondary | ICD-10-CM | POA: Diagnosis not present

## 2023-01-12 DIAGNOSIS — S81812A Laceration without foreign body, left lower leg, initial encounter: Secondary | ICD-10-CM | POA: Diagnosis not present

## 2023-01-12 DIAGNOSIS — L97822 Non-pressure chronic ulcer of other part of left lower leg with fat layer exposed: Secondary | ICD-10-CM | POA: Diagnosis not present

## 2023-01-14 NOTE — Progress Notes (Signed)
Belinda, HAUCK Day ([id number]) [id number] Page 1 of 9 Visit Report for 01/12/2023 Arrival Information Details Patient Name: Date of Service: Belinda Day, Belinda Day. 01/12/2023 9:45 [name] Medical Record Number: [id number] Patient Account Number: [id number] Date of Birth/Sex: Treating RN: December 07, 1945 (77 y.o. Orville Govern Primary Care Valta Dillon: Tana Conch Other Clinician: Referring Jerrie Gullo: Treating Callee Rohrig/Extender: Jaquelyn Bitter in Treatment: 11 Visit Information History Since Last Visit Added or deleted any medications: No Patient Arrived: Ambulatory Any new allergies or adverse reactions: No Arrival Time: 09:54 Had Day fall or experienced change in No Accompanied By: self activities of daily living that may affect Transfer Assistance: None risk of falls: Patient Identification Verified: Yes Signs or symptoms of abuse/neglect since last visito No Secondary Verification Process Completed: Yes Hospitalized since last visit: No Patient Requires Transmission-Based Precautions: No Implantable device outside of the clinic excluding No Patient Has Alerts: Yes cellular tissue based products placed in the center Patient Alerts: Patient on Blood Thinner since last visit: aspirin 81mg  Has Dressing in Place as Prescribed: Yes No ABI- Very painful. Pain Present Now: Yes Electronic Signature(s) Signed: 01/12/2023 5:04:26 PM By: Redmond Pulling RN, BSN Entered By: Redmond Pulling on 01/12/2023 06:55:18 -------------------------------------------------------------------------------- Clinic Level of Care Assessment Details Patient Name: Date of Service: Belinda Day RO N Day. 01/12/2023 9:45 [name] Medical Record Number: [id number] Patient Account Number: [id number] Date of Birth/Sex: Treating RN: 04-03-1945 (77 y.o. Orville Govern Primary Care Patriciaann Rabanal: Tana Conch Other Clinician: Referring Jereline Ticer: Treating Juni Glaab/Extender: Jaquelyn Bitter in Treatment: 11 Clinic Level of Care Assessment Items TOOL 4 Quantity Score X- 1 0 Use when only an EandM is performed on FOLLOW-UP visit ASSESSMENTS - Nursing Assessment / Reassessment X- 1 10 Reassessment of Co-morbidities (includes updates in patient status) X- 1 5 Reassessment of Adherence to Treatment Plan ASSESSMENTS - Wound and Skin Day ssessment / Reassessment X - Simple Wound Assessment / Reassessment - one wound 1 5 []  - 0 Complex Wound Assessment / Reassessment - multiple wounds []  - 0 Dermatologic / Skin Assessment (not related to wound area) ASSESSMENTS - Focused Assessment X- 1 5 Circumferential Edema Measurements - multi extremities []  - 0 Nutritional Assessment / Counseling / Intervention Belinda, Day Day ([id number]) [id number].pdf Page 2 of 9 []  - 0 Lower Extremity Assessment (monofilament, tuning fork, pulses) []  - 0 Peripheral Arterial Disease Assessment (using hand held doppler) ASSESSMENTS - Ostomy and/or Continence Assessment and Care []  - 0 Incontinence Assessment and Management []  - 0 Ostomy Care Assessment and Management (repouching, etc.) PROCESS - Coordination of Care X - Simple Patient / Family Education for ongoing care 1 15 []  - 0 Complex (extensive) Patient / Family Education for ongoing care X- 1 10 Staff obtains Chiropractor, Records, T Results / Process Orders est []  - 0 Staff telephones HHA, Nursing Homes / Clarify orders / etc []  - 0 Routine Transfer to another Facility (non-emergent condition) []  - 0 Routine Hospital Admission (non-emergent condition) []  - 0 New Admissions / Manufacturing engineer / Ordering NPWT Apligraf, etc. , []  - 0 Emergency Hospital Admission (emergent condition) []  - 0 Simple Discharge Coordination []  - 0 Complex (extensive) Discharge Coordination PROCESS - Special Needs []  - 0 Pediatric / Minor Patient Management []  - 0 Isolation Patient  Management []  - 0 Hearing / Language / Visual special needs []  - 0 Assessment of Community assistance (transportation, D/C planning, etc.) []  - 0 Additional assistance / Altered mentation []  - 0 Support Surface(s)  Assessment (bed, cushion, seat, etc.) INTERVENTIONS - Wound Cleansing / Measurement X - Simple Wound Cleansing - one wound 1 5 []  - 0 Complex Wound Cleansing - multiple wounds X- 1 5 Wound Imaging (photographs - any number of wounds) []  - 0 Wound Tracing (instead of photographs) X- 1 5 Simple Wound Measurement - one wound []  - 0 Complex Wound Measurement - multiple wounds INTERVENTIONS - Wound Dressings X - Small Wound Dressing one or multiple wounds 1 10 []  - 0 Medium Wound Dressing one or multiple wounds []  - 0 Large Wound Dressing one or multiple wounds []  - 0 Application of Medications - topical []  - 0 Application of Medications - injection INTERVENTIONS - Miscellaneous []  - 0 External ear exam []  - 0 Specimen Collection (cultures, biopsies, blood, body fluids, etc.) []  - 0 Specimen(s) / Culture(s) sent or taken to Lab for analysis []  - 0 Patient Transfer (multiple staff / Nurse, adult / Similar devices) []  - 0 Simple Staple / Suture removal (25 or less) []  - 0 Complex Staple / Suture removal (26 or more) []  - 0 Hypo / Hyperglycemic Management (close monitor of Blood Glucose) Day, Belinda Day ([id number]) [id number] Page 3 of 9 []  - 0 Ankle / Brachial Index (ABI) - do not check if billed separately X- 1 5 Vital Signs Has the patient been seen at the hospital within the last three years: Yes Total Score: 80 Level Of Care: New/Established - Level 3 Electronic Signature(s) Signed: 01/12/2023 5:04:26 PM By: Redmond Pulling RN, BSN Entered By: Redmond Pulling on 01/12/2023 09:33:44 -------------------------------------------------------------------------------- Encounter Discharge Information Details Patient Name: Date of  Service: Belinda Day RO N Day. 01/12/2023 9:45 [name] Medical Record Number: [id number] Patient Account Number: [id number] Date of Birth/Sex: Treating RN: [birth year] (77 y.o. Orville Govern Primary Care River Mckercher: Tana Conch Other Clinician: Referring Romello Hoehn: Treating Ladan Vanderzanden/Extender: Jaquelyn Bitter in Treatment: 11 Encounter Discharge Information Items Discharge Condition: Stable Ambulatory Status: Ambulatory Discharge Destination: Home Transportation: Private Auto Accompanied By: self Schedule Follow-up Appointment: Yes Clinical Summary of Care: Patient Declined Electronic Signature(s) Signed: 01/12/2023 5:04:26 PM By: Redmond Pulling RN, BSN Entered By: Redmond Pulling on 01/12/2023 09:34:43 -------------------------------------------------------------------------------- Lower Extremity Assessment Details Patient Name: Date of Service: Belinda Day RO N Day. 01/12/2023 9:45 [name] Medical Record Number: [id number] Patient Account Number: [id number] Date of Birth/Sex: Treating RN: August 01, 1945 (77 y.o. Orville Govern Primary Care Giovanny Dugal: Tana Conch Other Clinician: Referring Aribelle Mccosh: Treating Zong Mcquarrie/Extender: Jaquelyn Bitter in Treatment: 11 Edema Assessment Assessed: Kyra Searles: No] Franne Forts: No] Edema: [Left: N] [Right: o] Calf Left: Right: Point of Measurement: From Medial Instep 32.2 cm Ankle Left: Right: Point of Measurement: From Medial Instep 20.4 cm Vascular Assessment Kuipers, Idamae Day ([id number]) [Right:[id number] Page 4 of 9] Pulses: Dorsalis Pedis Palpable: [Left:Yes] Extremity colors, hair growth, and conditions: Extremity Color: [Left:Normal] Hair Growth on Extremity: [Left:Yes] Temperature of Extremity: [Left:Warm] Capillary Refill: [Left:< 3 seconds] Dependent Rubor: [Left:No No] Electronic Signature(s) Signed: 01/12/2023 5:04:26 PM By: Redmond Pulling RN, BSN Entered By:  Redmond Pulling on 01/12/2023 06:57:44 -------------------------------------------------------------------------------- Multi Wound Chart Details Patient Name: Date of Service: Belinda Day RO N Day. 01/12/2023 9:45 [name] Medical Record Number: [id number] Patient Account Number: [id number] Date of Birth/Sex: Treating RN: November 25, 1945 (77 y.o. F) Primary Care Hennesy Sobalvarro: Tana Conch Other Clinician: Referring Kendal Ghazarian: Treating Elaine Middleton/Extender: Jaquelyn Bitter in Treatment: 11 Vital Signs Height(in): 61 Pulse(bpm): 78 Weight(lbs): 113 Blood Pressure(mmHg): 137/68 Body Mass Index(BMI): 21.3 Temperature(F): 98.8 Respiratory  Rate(breaths/min): 18 [1:Photos:] [N/Day:N/Day] Left, Anterior Lower Leg N/Day N/Day Wound Location: Skin T ear/Laceration N/Day N/Day Wounding Event: Cellulitis N/Day N/Day Primary Etiology: Diabetic Wound/Ulcer of the Lower N/Day N/Day Secondary Etiology: Extremity Chronic Obstructive Pulmonary N/Day N/Day Comorbid History: Disease (COPD), Coronary Artery Disease, Hypertension, Type II Diabetes 08/28/2022 N/Day N/Day Date Acquired: 11 N/Day N/Day Weeks of Treatment: Open N/Day N/Day Wound Status: No N/Day N/Day Wound Recurrence: 0.9x1.1x0.1 N/Day N/Day Measurements L x W x D (cm) 0.778 N/Day N/Day Day (cm) : rea 0.078 N/Day N/Day Volume (cm) : 0.90% N/Day N/Day % Reduction in Area: 1.30% N/Day N/Day % Reduction in Volume: Full Thickness Without Exposed N/Day N/Day Classification: Support Structures Small N/Day N/Day Exudate Amount: Serosanguineous N/Day N/Day Exudate Type: red, brown N/Day N/Day Exudate Color: Distinct, outline attached N/Day N/Day Wound Margin: Medium (34-66%) N/Day N/Day Granulation Amount: Red N/Day N/Day Granulation Quality: Medium (34-66%) N/Day N/Day Necrotic Amount: MANDEEP, MATZA Day ([id number]) [id number] Page 5 of 9 Eschar, Adherent Slough N/Day N/Day Necrotic Tissue: Fat Layer (Subcutaneous Tissue): Yes N/Day N/Day Exposed Structures: Fascia: No Tendon:  No Muscle: No Joint: No Bone: No Small (1-33%) N/Day N/Day Epithelialization: Excoriation: No N/Day N/Day Periwound Skin Texture: Induration: No Callus: No Crepitus: No Rash: No Scarring: No Maceration: No N/Day N/Day Periwound Skin Moisture: Dry/Scaly: No Atrophie Blanche: No N/Day N/Day Periwound Skin Color: Cyanosis: No Ecchymosis: No Erythema: No Hemosiderin Staining: No Mottled: No Pallor: No Rubor: No No Abnormality N/Day N/Day Temperature: Treatment Notes Electronic Signature(s) Signed: 01/13/2023 12:11:14 PM By: Baltazar Najjar MD Entered By: Baltazar Najjar on 01/12/2023 07:27:43 -------------------------------------------------------------------------------- Multi-Disciplinary Care Plan Details Patient Name: Date of Service: Belinda Day RO N Day. 01/12/2023 9:45 [name] Medical Record Number: [id number] Patient Account Number: [id number] Date of Birth/Sex: Treating RN: 1945-04-30 (77 y.o. Orville Govern Primary Care Reigna Ruperto: Tana Conch Other Clinician: Referring Dacey Milberger: Treating Adael Culbreath/Extender: Jaquelyn Bitter in Treatment: 11 Active Inactive Wound/Skin Impairment Nursing Diagnoses: Impaired tissue integrity Goals: Patient/caregiver will verbalize understanding of skin care regimen Date Initiated: 10/27/2022 Target Resolution Date: 02/25/2023 Goal Status: Active Interventions: Assess ulceration(s) every visit Treatment Activities: Skin care regimen initiated : 10/27/2022 Notes: Electronic Signature(s) Signed: 01/12/2023 5:04:26 PM By: Redmond Pulling RN, BSN Entered By: Redmond Pulling on 01/12/2023 07:00:43 Brayton Layman Day ([id number]) [id number] Page 6 of 9 -------------------------------------------------------------------------------- Pain Assessment Details Patient Name: Date of Service: TANE, HIGHLAND Day. 01/12/2023 9:45 [name] Medical Record Number: [id number] Patient Account Number: [id number] Date of  Birth/Sex: Treating RN: Feb 23, 1945 (77 y.o. Orville Govern Primary Care Milany Geck: Tana Conch Other Clinician: Referring Zoya Sprecher: Treating Gaspar Fowle/Extender: Jaquelyn Bitter in Treatment: 11 Active Problems Location of Pain Severity and Description of Pain Patient Has Paino Yes Site Locations Pain Location: Pain in Ulcers With Dressing Change: Yes Rate the pain. Current Pain Level: 7 Pain Management and Medication Current Pain Management: Electronic Signature(s) Signed: 01/12/2023 5:04:26 PM By: Redmond Pulling RN, BSN Entered By: Redmond Pulling on 01/12/2023 06:57:24 -------------------------------------------------------------------------------- Patient/Caregiver Education Details Patient Name: Date of Service: Jorge Ny 12/18/2024andnbsp9:45 [name] Medical Record Number: [id number] Patient Account Number: [id number] Date of Birth/Gender: Treating RN: 24-Oct-1945 (77 y.o. Orville Govern Primary Care Physician: Tana Conch Other Clinician: Referring Physician: Treating Physician/Extender: Jaquelyn Bitter in Treatment: 11 Education Assessment Education Provided To: Patient Education Topics Provided Wound/Skin Impairment: Methods: Explain/Verbal Responses: State content correctly KATHLEENE, BASILONE Day ([id number]) [id number] Page 7 of 9 Electronic Signature(s) Signed: 01/12/2023 5:04:26 PM By: [name],  Lyla Son RN, BSN Entered By: Redmond Pulling on 01/12/2023 07:01:09 -------------------------------------------------------------------------------- Wound Assessment Details Patient Name: Date of Service: SHLONDA, ALLERY 01/12/2023 9:45 [name] Medical Record Number: [id number] Patient Account Number: [id number] Date of Birth/Sex: Treating RN: 04/02/1945 (77 y.o. Orville Govern Primary Care Lamarcus Spira: Tana Conch Other Clinician: Referring Mirakle Tomlin: Treating Nalla Purdy/Extender: Jaquelyn Bitter in Treatment: 11 Wound Status Wound Number: 1 Primary Cellulitis Etiology: Wound Location: Left, Anterior Lower Leg Secondary Diabetic Wound/Ulcer of the Lower Extremity Wounding Event: Skin Tear/Laceration Etiology: Date Acquired: 08/28/2022 Wound Open Weeks Of Treatment: 11 Status: Clustered Wound: No Comorbid Chronic Obstructive Pulmonary Disease (COPD), Coronary History: Artery Disease, Hypertension, Type II Diabetes Photos Wound Measurements Length: (cm) 0.9 Width: (cm) 1.1 Depth: (cm) 0.1 Area: (cm) 0.778 Volume: (cm) 0.078 % Reduction in Area: 0.9% % Reduction in Volume: 1.3% Epithelialization: Small (1-33%) Tunneling: No Undermining: No Wound Description Classification: Full Thickness Without Exposed Suppor Wound Margin: Distinct, outline attached Exudate Amount: Small Exudate Type: Serosanguineous Exudate Color: red, brown t Structures Foul Odor After Cleansing: No Slough/Fibrino Yes Wound Bed Granulation Amount: Medium (34-66%) Exposed Structure Granulation Quality: Red Fascia Exposed: No Necrotic Amount: Medium (34-66%) Fat Layer (Subcutaneous Tissue) Exposed: Yes Necrotic Quality: Eschar, Adherent Slough Tendon Exposed: No Muscle Exposed: No Joint Exposed: No Bone Exposed: No Periwound Skin Texture Texture Color No Abnormalities Noted: No No Abnormalities Noted: No Callus: No Atrophie Blanche: No Crepitus: No Cyanosis: No Rahilly, Tiasia Day ([id number]) [id number].pdf Page 8 of 9 Excoriation: No Ecchymosis: No Induration: No Erythema: No Rash: No Hemosiderin Staining: No Scarring: No Mottled: No Pallor: No Moisture Rubor: No No Abnormalities Noted: No Dry / Scaly: No Temperature / Pain Maceration: No Temperature: No Abnormality Treatment Notes Wound #1 (Lower Leg) Wound Laterality: Left, Anterior Cleanser Soap and Water Discharge Instruction: May shower and wash wound with dial  antibacterial soap and water prior to dressing change. Vashe 5.8 (oz) Discharge Instruction: Cleanse the wound with Vashe prior to applying Day clean dressing using gauze sponges, not tissue or cotton balls. Peri-Wound Care Topical Primary Dressing Hydrofera Blue Ready Transfer Foam, 2.5x2.5 (in/in) Discharge Instruction: Apply directly to wound bed as directed Secondary Dressing Woven Gauze Sponge, Non-Sterile 4x4 in Discharge Instruction: Apply over primary dressing as directed. Secured With Conforming Stretch Gauze Bandage, Sterile 2x75 (in/in) Discharge Instruction: Secure with stretch gauze as directed. 37M Medipore Soft Cloth Surgical T 2x10 (in/yd) ape Discharge Instruction: Secure with tape as directed. Compression Wrap Compression Stockings Add-Ons Electronic Signature(s) Signed: 01/12/2023 5:04:26 PM By: Redmond Pulling RN, BSN Entered By: Redmond Pulling on 01/12/2023 06:59:11 -------------------------------------------------------------------------------- Vitals Details Patient Name: Date of Service: Belinda Day RO N Day. 01/12/2023 9:45 [name] Medical Record Number: [id number] Patient Account Number: [id number] Date of Birth/Sex: Treating RN: 14-Mar-1945 (77 y.o. Orville Govern Primary Care Rula Keniston: Tana Conch Other Clinician: Referring Jaydn Moscato: Treating Kelbi Renstrom/Extender: Jaquelyn Bitter in Treatment: 11 Vital Signs Time Taken: 09:50 Temperature (F): 98.8 Height (in): 61 Pulse (bpm): 78 Weight (lbs): 113 Respiratory Rate (breaths/min): 18 Body Mass Index (BMI): 21.3 Blood Pressure (mmHg): 137/68 Reference Range: 80 - 120 mg / dl Electronic Signature(s) Signed: 01/12/2023 5:04:26 PM By: Redmond Pulling RN, BSN Brayton Layman Day ([id number]) PM By: Redmond Pulling RN, BSN [id number].pdf Page 9 of 9 Signed: 01/12/2023 5:04:26 Entered By: Redmond Pulling on 01/12/2023 06:55:38

## 2023-01-14 NOTE — Progress Notes (Signed)
WINDSOR, CABAL Day ([id number]) [id number] Page 1 of 6 Visit Report for 01/12/2023 HPI Details Patient Name: Date of Service: Belinda Day, Belinda Day. 01/12/2023 9:45 [name] Medical Record Number: [id number] Patient Account Number: [id number] Date of Birth/Sex: Treating RN: 17-Nov-1945 (77 y.o. F) Primary Care Provider: Tana Conch Other Clinician: Referring Provider: Treating Provider/Extender: Jaquelyn Bitter in Treatment: 11 History of Present Illness HPI Description: 10/27/2022 upon evaluation today patient presents for initial inspection here in the clinic regarding Day wound over the left anterior lower extremity. T back up Day little bit and gives some history I am going to recount what the patient brought in written out on her sheet with her today which is an o excellent history of the course of this wound. On August 3 the patient sustained an injury to the left leg due to Day fall off of Day ladder. This was essentially Day deep cut and continue to bleed for about 4 days. On August 7 she saw Dr. Lenn Cal at [facility] and was started on Keflex at that point. Subsequently on August 14 she went back to [facility] where she was placed on doxycycline 100 mg for 2 rounds. The next time that she was evaluated was actually about 3 weeks later when back at [facility] September 6 she was sent to the hospital due to what appeared to be worsening and started on vancomycin IV. September 7 she was actually discharged from the hospital with linezolid 600 mg. On September 18 she saw Dr. Anselm Jungling who recommended the patient go to [facility] long the next morning. On September 19 she saw infectious disease and they ordered an MRI as well as putting the patient on Dalvance. This was by IV. The next appointment with infectious disease was September 26 at that point it was noted that the patient had Day number MRI and the results of that were  reported out on 10-14-2022 which showed that she had soft tissue swelling of the distal lower leg with focal skin thickening and enhancement anteriorly consistent with cellulitis no abscess. There was also Day thin periosteal edema and enhancement along the anterior medial aspect of the distal tibia this is stated to be likely reactive no definitive osteomyelitis noted. Subsequently she states that she did follow-up requesting to go to the wound center for further evaluation and was referred to [place] today October 2 for evaluation in the meantime she has been using Silvadene cream. Her most recent hemoglobin A1c was 6.6 she does have diabetes but otherwise no major medical problems other than hypertension and COPD that would affect her ability to heal. 11-03-2022 upon evaluation today patient appears to be doing okay currently in regard to her wound. I did actually perform Day biopsy last week and came back positive for squamous cell carcinoma. With that being said I discussed with the patient that she is likely can require Day full excision of this area and I recommended that she may need to go to the skin surgery center unless we get her in with her dermatologist sooner. I am not sure if they have Day Mohs surgeon at [facility] dermatology or not. With that being said we will get Day see about get in touch with them and finding out which I think will definitely be of benefit if so because it could be something when she gets in much faster. 11-17-2022 upon evaluation today patient presents for follow-up I last saw her on October 9 since  then she actually have the mobile skin surgery on November 15, 2022 that she has been 2 days ago. This actually went very well the area that they had to remove was not nearly as large as I was fearing this is also and excellent. With that being said I do believe the patient is actually making progress here already towards seeing this improvement although again it is much too early to  tell that it is actually healing currently. There is Day lot of bleeding we want Day make sure that this does not turn into just Day blood clot or harder scabs on the use Xeroform gauze at this point helps keep this soft over the next week we will see where things stand following. I am very pleased however they were able to get her in so clinically that is awesome and I do appreciate that. 11-24-2022 upon evaluation patient's wound is showing signs of improvement this does seem to be improving as far as the overall appearance of the wound is concerned. I feel like that it is slowly clearing up to Day good surface underneath were not quite there yet but at the same time I think Xeroform is doing Day good job. 11/6; Day wound that was initially felt to be traumatic however Day biopsy of part of this showed squamous cell carcinoma. She underwent Mohs surgery on October 21. We were seeing her for the postop wound care. Apparently the margins of this were clear. She has been using Xeroform and ZZetuvit 11/13; Mohs surgery site. Xeroform and gauze. Comes in the clinic with Day nonviable surface. 11/20; Mohs surgery sites. I changed her last week to [facility] and gauze. We have been trying to order her compression stockings but running into trouble with [employer] 12/11; Mohs surgery site on the left leg. This is on the anterior left leg. We have been using Hydrofera Blue and foam she is changing this daily. She missed appointments because of issues related to hypoglycemia while she was having Day colonoscopy. We do not have any more information about her compression stockings through [employer] 12/18; Mohs surgery site on the left leg. The wound is come down by half Day centimeter in diameter. The surface looks reasonably clean. We have been using Hydrofera Blue and border foam she is changing this herself. She still complains of Day stinging pain around the wound although there is no obvious erythema  or warmth. She has not been systemically unwell Electronic Signature(s) Signed: 01/13/2023 12:11:14 PM By: Baltazar Najjar MD Entered By: Baltazar Najjar on 01/12/2023 10:29:32 Belinda Day ([id number]) [id number] Page 2 of 6 -------------------------------------------------------------------------------- Physical Exam Details Patient Name: Date of Service: Belinda Day, Belinda Day. 01/12/2023 9:45 [name] Medical Record Number: [id number] Patient Account Number: [id number] Date of Birth/Sex: Treating RN: [birth year] (77 y.o. F) Primary Care Provider: Tana Conch Other Clinician: Referring Provider: Treating Provider/Extender: Jaquelyn Bitter in Treatment: 11 Constitutional Sitting or standing Blood Pressure is within target range for patient.. Pulse regular and within target range for patient.Marland Kitchen Respirations regular, non-labored and within target range.. Temperature is normal and within the target range for the patient.Marland Kitchen Appears in no distress. Notes Wound exam; left anterior lower leg. The surface here looks satisfactory given the improvement in wound diameter. There is no depth to this. There is no surrounding warmth with erythema but she is significantly tender. Electronic Signature(s) Signed: 01/13/2023 12:11:14 PM By: Baltazar Najjar MD Entered By: Baltazar Najjar on 01/12/2023 10:30:15 -------------------------------------------------------------------------------- Physician  Orders Details Patient Name: Date of Service: Belinda Day, Belinda Day. 01/12/2023 9:45 [name] Medical Record Number: [id number] Patient Account Number: [id number] Date of Birth/Sex: Treating RN: [birth year] (77 y.o. Orville Govern Primary Care Provider: Tana Conch Other Clinician: Referring Provider: Treating Provider/Extender: Jaquelyn Bitter in Treatment: 11 Verbal / Phone Orders: No Diagnosis Coding Follow-up Appointments ppointment  in 2 weeks. - Dr. Leanord Hawking 01/27/22 9:45am room 8 Return Day Anesthetic (In clinic) Topical Lidocaine 5% applied to wound bed Bathing/ Shower/ Hygiene May shower and wash wound with soap and water. - Keep bandage on when in shower, at the end of shower remove dressing and wash with dial Antibacterial soap. After shower, gentle pat dry and apply Xeroform and form border dressing Additional Orders / Instructions Other: - Punch Biopsy sent to [facility] labs. (10/27/22)- Done Wound Treatment Wound #1 - Lower Leg Wound Laterality: Left, Anterior Cleanser: Soap and Water 1 x Per Day/30 Days Discharge Instructions: May shower and wash wound with dial antibacterial soap and water prior to dressing change. Cleanser: Vashe 5.8 (oz) 1 x Per Day/30 Days Discharge Instructions: Cleanse the wound with Vashe prior to applying Day clean dressing using gauze sponges, not tissue or cotton balls. Prim Dressing: Hydrofera Blue Ready Transfer Foam, 2.5x2.5 (in/in) 1 x Per Day/30 Days ary Discharge Instructions: Apply directly to wound bed as directed Secondary Dressing: Woven Gauze Sponge, Non-Sterile 4x4 in 1 x Per Day/30 Days Discharge Instructions: Apply over primary dressing as directed. Secured With: Insurance underwriter, Sterile 2x75 (in/in) 1 x Per Day/30 Days Discharge Instructions: Secure with stretch gauze as directed. Secured With: 63M [employer] Scientist, research (life sciences) Surgical T 2x10 (in/yd) 1 x Per Day/30 Days ape Discharge Instructions: Secure with tape as directed. Belinda Day, Belinda Day ([id number]) [id number] Page 3 of 6 Patient Medications llergies: codeine, Bactrim, Cipro, Levaquin Day Notifications Medication Indication Start End 01/12/2023 lidocaine DOSE topical 5 % ointment - ointment topical once daily wound infection 01/12/2023 doxycycline monohydrate DOSE oral 100 mg capsule - 1 capsule oral twice Day day for 7 days Electronic Signature(s) Signed: 01/12/2023 10:34:20 AM By:  Baltazar Najjar MD Entered By: Baltazar Najjar on 01/12/2023 10:34:19 -------------------------------------------------------------------------------- Problem List Details Patient Name: Date of Service: Belinda Day. 01/12/2023 9:45 [name] Medical Record Number: [id number] Patient Account Number: [id number] Date of Birth/Sex: Treating RN: September 24, 1945 (77 y.o. F) Primary Care Provider: Tana Conch Other Clinician: Referring Provider: Treating Provider/Extender: Jaquelyn Bitter in Treatment: 11 Active Problems ICD-10 Encounter Code Description Active Date MDM Diagnosis [phone] Laceration without foreign body, left lower leg, initial encounter 10/27/2022 No Yes L03.116 Cellulitis of left lower limb 10/27/2022 No Yes E11.622 Type 2 diabetes mellitus with other skin ulcer 10/27/2022 No Yes L97.822 Non-pressure chronic ulcer of other part of left lower leg with fat layer exposed10/02/2022 No Yes I10 Essential (primary) hypertension 10/27/2022 No Yes J44.89 Other specified chronic obstructive pulmonary disease 10/27/2022 No Yes Inactive Problems Resolved Problems Electronic Signature(s) Signed: 01/13/2023 12:11:14 PM By: Baltazar Najjar MD Entered By: Baltazar Najjar on 01/12/2023 10:27:36 Belinda Day ([id number]) [id number] Page 4 of 6 -------------------------------------------------------------------------------- Progress Note Details Patient Name: Date of Service: Belinda Day, Belinda Day. 01/12/2023 9:45 [name] Medical Record Number: [id number] Patient Account Number: [id number] Date of Birth/Sex: Treating RN: 1945-04-07 (77 y.o. F) Primary Care Provider: Tana Conch Other Clinician: Referring Provider: Treating Provider/Extender: Jaquelyn Bitter in Treatment: 11 Subjective History of Present Illness (HPI) 10/27/2022 upon evaluation today  patient presents for initial inspection here in the clinic regarding  Day wound over the left anterior lower extremity. T back up Day o little bit and gives some history I am going to recount what the patient brought in written out on her sheet with her today which is an excellent history of the course of this wound. On August 3 the patient sustained an injury to the left leg due to Day fall off of Day ladder. This was essentially Day deep cut and continue to bleed for about 4 days. On August 7 she saw Dr. Lenn Cal at [facility] and was started on Keflex at that point. Subsequently on August 14 she went back to [facility] where she was placed on doxycycline 100 mg for 2 rounds. The next time that she was evaluated was actually about 3 weeks later when back at [facility] September 6 she was sent to the hospital due to what appeared to be worsening and started on vancomycin IV. September 7 she was actually discharged from the hospital with linezolid 600 mg. On September 18 she saw Dr. Anselm Jungling who recommended the patient go to [facility] long the next morning. On September 19 she saw infectious disease and they ordered an MRI as well as putting the patient on Dalvance. This was by IV. The next appointment with infectious disease was September 26 at that point it was noted that the patient had Day number MRI and the results of that were reported out on 10-14-2022 which showed that she had soft tissue swelling of the distal lower leg with focal skin thickening and enhancement anteriorly consistent with cellulitis no abscess. There was also Day thin periosteal edema and enhancement along the anterior medial aspect of the distal tibia this is stated to be likely reactive no definitive osteomyelitis noted. Subsequently she states that she did follow-up requesting to go to the wound center for further evaluation and was referred to [place] today October 2 for evaluation in the meantime she has been using Silvadene cream. Her most recent hemoglobin A1c was 6.6 she does  have diabetes but otherwise no major medical problems other than hypertension and COPD that would affect her ability to heal. 11-03-2022 upon evaluation today patient appears to be doing okay currently in regard to her wound. I did actually perform Day biopsy last week and came back positive for squamous cell carcinoma. With that being said I discussed with the patient that she is likely can require Day full excision of this area and I recommended that she may need to go to the skin surgery center unless we get her in with her dermatologist sooner. I am not sure if they have Day Mohs surgeon at [facility] dermatology or not. With that being said we will get Day see about get in touch with them and finding out which I think will definitely be of benefit if so because it could be something when she gets in much faster. 11-17-2022 upon evaluation today patient presents for follow-up I last saw her on October 9 since then she actually have the mobile skin surgery on November 15, 2022 that she has been 2 days ago. This actually went very well the area that they had to remove was not nearly as large as I was fearing this is also and excellent. With that being said I do believe the patient is actually making progress here already towards seeing this improvement although again it is much too early to tell that  it is actually healing currently. There is Day lot of bleeding we want Day make sure that this does not turn into just Day blood clot or harder scabs on the use Xeroform gauze at this point helps keep this soft over the next week we will see where things stand following. I am very pleased however they were able to get her in so clinically that is awesome and I do appreciate that. 11-24-2022 upon evaluation patient's wound is showing signs of improvement this does seem to be improving as far as the overall appearance of the wound is concerned. I feel like that it is slowly clearing up to Day good surface underneath were  not quite there yet but at the same time I think Xeroform is doing Day good job. 11/6; Day wound that was initially felt to be traumatic however Day biopsy of part of this showed squamous cell carcinoma. She underwent Mohs surgery on October 21. We were seeing her for the postop wound care. Apparently the margins of this were clear. She has been using Xeroform and ZZetuvit 11/13; Mohs surgery site. Xeroform and gauze. Comes in the clinic with Day nonviable surface. 11/20; Mohs surgery sites. I changed her last week to [facility] and gauze. We have been trying to order her compression stockings but running into trouble with [employer] 12/11; Mohs surgery site on the left leg. This is on the anterior left leg. We have been using Hydrofera Blue and foam she is changing this daily. She missed appointments because of issues related to hypoglycemia while she was having Day colonoscopy. We do not have any more information about her compression stockings through [employer] 12/18; Mohs surgery site on the left leg. The wound is come down by half Day centimeter in diameter. The surface looks reasonably clean. We have been using Hydrofera Blue and border foam she is changing this herself. She still complains of Day stinging pain around the wound although there is no obvious erythema or warmth. She has not been systemically unwell Objective Constitutional Sitting or standing Blood Pressure is within target range for patient.. Pulse regular and within target range for patient.Marland Kitchen Respirations regular, non-labored and within target range.. Temperature is normal and within the target range for the patient.Marland Kitchen Appears in no distress. Vitals Time Taken: 9:50 AM, Height: 61 in, Weight: 113 lbs, BMI: 21.3, Temperature: 98.8 F, Pulse: 78 bpm, Respiratory Rate: 18 breaths/min, Blood Pressure: 137/68 mmHg. Belinda Day, Belinda Day ([id number]) [id number] Page 5 of 6 General Notes: Wound exam; left  anterior lower leg. The surface here looks satisfactory given the improvement in wound diameter. There is no depth to this. There is no surrounding warmth with erythema but she is significantly tender. Integumentary (Hair, Skin) Wound #1 status is Open. Original cause of wound was Skin T ear/Laceration. The date acquired was: 08/28/2022. The wound has been in treatment 11 weeks. The wound is located on the Left,Anterior Lower Leg. The wound measures 0.9cm length x 1.1cm width x 0.1cm depth; 0.778cm^2 area and 0.078cm^3 volume. There is Fat Layer (Subcutaneous Tissue) exposed. There is no tunneling or undermining noted. There is Day small amount of serosanguineous drainage noted. The wound margin is distinct with the outline attached to the wound base. There is medium (34-66%) red granulation within the wound bed. There is Day medium (34-66%) amount of necrotic tissue within the wound bed including Eschar and Adherent Slough. The periwound skin appearance did not exhibit: Callus, Crepitus, Excoriation, Induration, Rash, Scarring, Dry/Scaly, Maceration, Atrophie Blanche,  Cyanosis, Ecchymosis, Hemosiderin Staining, Mottled, Pallor, Rubor, Erythema. Periwound temperature was noted as No Abnormality. Assessment Active Problems ICD-10 Laceration without foreign body, left lower leg, initial encounter Cellulitis of left lower limb Type 2 diabetes mellitus with other skin ulcer Non-pressure chronic ulcer of other part of left lower leg with fat layer exposed Essential (primary) hypertension Other specified chronic obstructive pulmonary disease Plan Follow-up Appointments: Return Appointment in 2 weeks. - Dr. Leanord Hawking 01/27/22 9:45am room 8 Anesthetic: (In clinic) Topical Lidocaine 5% applied to wound bed Bathing/ Shower/ Hygiene: May shower and wash wound with soap and water. - Keep bandage on when in shower, at the end of shower remove dressing and wash with dial Antibacterial soap. After shower, gentle pat  dry and apply Xeroform and form border dressing Additional Orders / Instructions: Other: - Punch Biopsy sent to [facility] labs. (10/27/22)- Done The following medication(s) was prescribed: lidocaine topical 5 % ointment ointment topical once daily was prescribed at facility doxycycline monohydrate oral 100 mg capsule 1 capsule oral twice Day day for 7 days for wound infection starting 01/12/2023 WOUND #1: - Lower Leg Wound Laterality: Left, Anterior Cleanser: Soap and Water 1 x Per Day/30 Days Discharge Instructions: May shower and wash wound with dial antibacterial soap and water prior to dressing change. Cleanser: Vashe 5.8 (oz) 1 x Per Day/30 Days Discharge Instructions: Cleanse the wound with Vashe prior to applying Day clean dressing using gauze sponges, not tissue or cotton balls. Prim Dressing: Hydrofera Blue Ready Transfer Foam, 2.5x2.5 (in/in) 1 x Per Day/30 Days ary Discharge Instructions: Apply directly to wound bed as directed Secondary Dressing: Woven Gauze Sponge, Non-Sterile 4x4 in 1 x Per Day/30 Days Discharge Instructions: Apply over primary dressing as directed. Secured With: Insurance underwriter, Sterile 2x75 (in/in) 1 x Per Day/30 Days Discharge Instructions: Secure with stretch gauze as directed. Secured With: 4M Medipore Scientist, research (life sciences) Surgical T 2x10 (in/yd) 1 x Per Day/30 Days ape Discharge Instructions: Secure with tape as directed. 1. I am continuing with the same dressing which is Hydrofera Blue and border foam she is changing this herself 2. The discomfort in she complains of them and the tenderness in the absence of any discernible evidence of skin or soft tissue infection. Nevertheless the tenderness was impressive enough then I am going to give her 7 days worth of doxycycline. 3. Follow-up in 2 weeks Electronic Signature(s) Signed: 01/12/2023 5:35:56 PM By: Shawn Stall RN, BSN Signed: 01/13/2023 12:11:14 PM By: Baltazar Najjar MD Entered By: Shawn Stall on  01/12/2023 17:35:31 -------------------------------------------------------------------------------- SuperBill Details Patient Name: Date of Service: Belinda Day. 01/12/2023 Belinda Day ([id number]) [id number] Page 6 of 6 Medical Record Number: [id number] Patient Account Number: [id number] Date of Birth/Sex: Treating RN: 1945/11/24 (77 y.o. F) Primary Care Provider: Tana Conch Other Clinician: Referring Provider: Treating Provider/Extender: Jaquelyn Bitter in Treatment: 11 Diagnosis Coding ICD-10 Codes Code Description [phone] Laceration without foreign body, left lower leg, initial encounter L03.116 Cellulitis of left lower limb E11.622 Type 2 diabetes mellitus with other skin ulcer L97.822 Non-pressure chronic ulcer of other part of left lower leg with fat layer exposed I10 Essential (primary) hypertension J44.89 Other specified chronic obstructive pulmonary disease Facility Procedures : CPT4 Code: [id number] Description: 99213 - WOUND CARE VISIT-LEV 3 EST PT Modifier: Quantity: 1 Physician Procedures : CPT4 Code Description Modifier [id number] 99213 - WC PHYS LEVEL 3 - EST PT ICD-10 Diagnosis Description L97.822 Non-pressure chronic ulcer of other part of left lower leg  with fat layer exposed L03.116 Cellulitis of left lower limb Quantity: 1 Electronic Signature(s) Signed: 01/12/2023 5:04:26 PM By: Redmond Pulling RN, BSN Signed: 01/13/2023 12:11:14 PM By: Baltazar Najjar MD Entered By: Redmond Pulling on 01/12/2023 12:33:54

## 2023-01-18 ENCOUNTER — Other Ambulatory Visit (HOSPITAL_COMMUNITY): Payer: Self-pay | Admitting: Primary Care

## 2023-01-18 DIAGNOSIS — R059 Cough, unspecified: Secondary | ICD-10-CM

## 2023-01-18 DIAGNOSIS — R1310 Dysphagia, unspecified: Secondary | ICD-10-CM

## 2023-01-27 ENCOUNTER — Encounter (HOSPITAL_BASED_OUTPATIENT_CLINIC_OR_DEPARTMENT_OTHER): Payer: Medicare Other | Attending: Internal Medicine | Admitting: Internal Medicine

## 2023-01-27 DIAGNOSIS — W19XXXA Unspecified fall, initial encounter: Secondary | ICD-10-CM | POA: Diagnosis not present

## 2023-01-27 DIAGNOSIS — S81812A Laceration without foreign body, left lower leg, initial encounter: Secondary | ICD-10-CM | POA: Insufficient documentation

## 2023-01-27 DIAGNOSIS — X58XXXA Exposure to other specified factors, initial encounter: Secondary | ICD-10-CM | POA: Insufficient documentation

## 2023-01-27 DIAGNOSIS — L03116 Cellulitis of left lower limb: Secondary | ICD-10-CM | POA: Insufficient documentation

## 2023-01-27 DIAGNOSIS — E11622 Type 2 diabetes mellitus with other skin ulcer: Secondary | ICD-10-CM | POA: Insufficient documentation

## 2023-01-27 DIAGNOSIS — L97822 Non-pressure chronic ulcer of other part of left lower leg with fat layer exposed: Secondary | ICD-10-CM | POA: Insufficient documentation

## 2023-01-27 DIAGNOSIS — I10 Essential (primary) hypertension: Secondary | ICD-10-CM | POA: Insufficient documentation

## 2023-01-27 DIAGNOSIS — J4489 Other specified chronic obstructive pulmonary disease: Secondary | ICD-10-CM | POA: Insufficient documentation

## 2023-01-28 NOTE — Progress Notes (Signed)
 ANNALEIA, PENCE A ([id number]) [id number] Page 1 of 9 Visit Report for 01/27/2023 Arrival Information Details Patient Name: Date of Service: CLEOTILDE, SPADACCINI A. 01/27/2023 10:15 A M Medical Record Number: [id number] Patient Account Number: [id number] Date of Birth/Sex: Treating RN: 01/22/1946 (78 y.o. F) Primary Care Urania Pearlman: Katrinka Senior Other Clinician: Referring Mckenlee Mangham: Treating Soumya Colson/Extender: Rufus Ozell Katrinka Senior Devra in Treatment: 13 Visit Information History Since Last Visit Added or deleted any medications: Yes Patient Arrived: Ambulatory Any new allergies or adverse reactions: No Arrival Time: 10:29 Had a fall or experienced change in No Accompanied By: self activities of daily living that may affect Transfer Assistance: None risk of falls: Patient Identification Verified: Yes Signs or symptoms of abuse/neglect since last visito No Secondary Verification Process Completed: Yes Hospitalized since last visit: No Patient Requires Transmission-Based Precautions: No Implantable device outside of the clinic excluding No Patient Has Alerts: Yes cellular tissue based products placed in the center Patient Alerts: Patient on Blood Thinner since last visit: aspirin  81mg  Has Dressing in Place as Prescribed: Yes No ABI- Very painful. Pain Present Now: No Electronic Signature(s) Signed: 01/27/2023 3:57:02 PM By: Wyn Iha Entered By: Wyn Iha on 01/27/2023 10:32:24 -------------------------------------------------------------------------------- Clinic Level of Care Assessment Details Patient Name: Date of Service: ILEE, RANDLEMAN RO N A. 01/27/2023 10:15 A M Medical Record Number: [id number] Patient Account Number: [id number] Date of Birth/Sex: Treating RN: 11-21-1945 (78 y.o. JEANELL Drury Nestle Primary Care Leilany Digeronimo: Katrinka Senior Other Clinician: Referring Alisandra Son: Treating Gladine Plude/Extender: Rufus Ozell Katrinka Senior Devra  in Treatment: 13 Clinic Level of Care Assessment Items TOOL 4 Quantity Score X- 1 0 Use when only an EandM is performed on FOLLOW-UP visit ASSESSMENTS - Nursing Assessment / Reassessment X- 1 10 Reassessment of Co-morbidities (includes updates in patient status) X- 1 5 Reassessment of Adherence to Treatment Plan ASSESSMENTS - Wound and Skin A ssessment / Reassessment X - Simple Wound Assessment / Reassessment - one wound 1 5 []  - 0 Complex Wound Assessment / Reassessment - multiple wounds X- 1 10 Dermatologic / Skin Assessment (not related to wound area) ASSESSMENTS - Focused Assessment X- 1 5 Circumferential Edema Measurements - multi extremities []  - 0 Nutritional Assessment / Counseling / Intervention YENI, JIGGETTS A ([id number]) [id number] Page 2 of 9 []  - 0 Lower Extremity Assessment (monofilament, tuning fork, pulses) []  - 0 Peripheral Arterial Disease Assessment (using hand held doppler) ASSESSMENTS - Ostomy and/or Continence Assessment and Care []  - 0 Incontinence Assessment and Management []  - 0 Ostomy Care Assessment and Management (repouching, etc.) PROCESS - Coordination of Care X - Simple Patient / Family Education for ongoing care 1 15 []  - 0 Complex (extensive) Patient / Family Education for ongoing care X- 1 10 Staff obtains Chiropractor, Records, T Results / Process Orders est []  - 0 Staff telephones HHA, Nursing Homes / Clarify orders / etc []  - 0 Routine Transfer to another Facility (non-emergent condition) []  - 0 Routine Hospital Admission (non-emergent condition) []  - 0 New Admissions / Manufacturing Engineer / Ordering NPWT Apligraf, etc. , []  - 0 Emergency Hospital Admission (emergent condition) X- 1 10 Simple Discharge Coordination []  - 0 Complex (extensive) Discharge Coordination PROCESS - Special Needs []  - 0 Pediatric / Minor Patient Management []  - 0 Isolation Patient Management []  - 0 Hearing / Language /  Visual special needs []  - 0 Assessment of Community assistance (transportation, D/C planning, etc.) []  - 0 Additional assistance / Altered mentation []  - 0 Support Surface(s) Assessment (bed, cushion, seat,  etc.) INTERVENTIONS - Wound Cleansing / Measurement X - Simple Wound Cleansing - one wound 1 5 []  - 0 Complex Wound Cleansing - multiple wounds X- 1 5 Wound Imaging (photographs - any number of wounds) []  - 0 Wound Tracing (instead of photographs) X- 1 5 Simple Wound Measurement - one wound []  - 0 Complex Wound Measurement - multiple wounds INTERVENTIONS - Wound Dressings X - Small Wound Dressing one or multiple wounds 1 10 []  - 0 Medium Wound Dressing one or multiple wounds []  - 0 Large Wound Dressing one or multiple wounds []  - 0 Application of Medications - topical []  - 0 Application of Medications - injection INTERVENTIONS - Miscellaneous []  - 0 External ear exam []  - 0 Specimen Collection (cultures, biopsies, blood, body fluids, etc.) []  - 0 Specimen(s) / Culture(s) sent or taken to Lab for analysis []  - 0 Patient Transfer (multiple staff / Nurse, Adult / Similar devices) []  - 0 Simple Staple / Suture removal (25 or less) []  - 0 Complex Staple / Suture removal (26 or more) []  - 0 Hypo / Hyperglycemic Management (close monitor of Blood Glucose) Sonnier, Azariah A ([id number]) [id number] Page 3 of 9 []  - 0 Ankle / Brachial Index (ABI) - do not check if billed separately X- 1 5 Vital Signs Has the patient been seen at the hospital within the last three years: Yes Total Score: 100 Level Of Care: New/Established - Level 3 Electronic Signature(s) Signed: 01/28/2023 12:56:04 PM By: Drury Nestle RN, BSN Entered By: Drury Nestle on 01/27/2023 10:48:45 -------------------------------------------------------------------------------- Encounter Discharge Information Details Patient Name: Date of Service: CLAUDIUS CIVIL RO N A. 01/27/2023 10:15 A  M Medical Record Number: [id number] Patient Account Number: [id number] Date of Birth/Sex: Treating RN: 09-25-1945 (78 y.o. JEANELL Drury Nestle Primary Care Undine Nealis: Katrinka Senior Other Clinician: Referring Wrenn Willcox: Treating Jahzier Villalon/Extender: Rufus Ozell Katrinka Senior Devra in Treatment: 13 Encounter Discharge Information Items Discharge Condition: Stable Ambulatory Status: Ambulatory Discharge Destination: Home Transportation: Private Auto Accompanied By: self Schedule Follow-up Appointment: Yes Clinical Summary of Care: Electronic Signature(s) Signed: 01/28/2023 12:56:04 PM By: Drury Nestle RN, BSN Entered By: Drury Nestle on 01/27/2023 10:49:15 -------------------------------------------------------------------------------- Lower Extremity Assessment Details Patient Name: Date of Service: CLAUDIUS CIVIL RO N A. 01/27/2023 10:15 A M Medical Record Number: [id number] Patient Account Number: [id number] Date of Birth/Sex: Treating RN: 1945-05-21 (78 y.o. F) Primary Care Zareen Jamison: Katrinka Senior Other Clinician: Referring Fergus Throne: Treating Uchechukwu Dhawan/Extender: Rufus Ozell Katrinka Senior Devra in Treatment: 13 Edema Assessment Assessed: Colletta: No] Glenis: No] Edema: [Left: N] [Right: o] Calf Left: Right: Point of Measurement: From Medial Instep 32.5 cm Ankle Left: Right: Point of Measurement: From Medial Instep 19.5 cm Vascular Assessment Slotnick, Alliya A ([id number]) [Right:[id number] Page 4 of 9] Extremity colors, hair growth, and conditions: Extremity Color: [Left:Normal] Hair Growth on Extremity: [Left:Yes] Temperature of Extremity: [Left:Warm] Capillary Refill: [Left:< 3 seconds] Dependent Rubor: [Left:No No] Toe Nail Assessment Left: Right: Thick: No Discolored: No Deformed: No Improper Length and Hygiene: No Electronic Signature(s) Signed: 01/27/2023 3:57:02 PM By: Wyn Iha Entered By: Wyn Iha on 01/27/2023  10:36:58 -------------------------------------------------------------------------------- Multi Wound Chart Details Patient Name: Date of Service: CLAUDIUS CIVIL RO N A. 01/27/2023 10:15 A M Medical Record Number: [id number] Patient Account Number: [id number] Date of Birth/Sex: Treating RN: September 19, 1945 (78 y.o. F) Primary Care Albertia Carvin: Katrinka Senior Other Clinician: Referring Brette Cast: Treating Kaslyn Richburg/Extender: Rufus Ozell Katrinka Senior Devra in Treatment: 13 Vital Signs Height(in): 61 Pulse(bpm): 70 Weight(lbs): 113 Blood Pressure(mmHg): 128/72 Body Mass Index(BMI): 21.3 Temperature(F): 98.8  Respiratory Rate(breaths/min): 18 [1:Photos:] [N/A:N/A] Left, Anterior Lower Leg N/A N/A Wound Location: Skin T ear/Laceration N/A N/A Wounding Event: Cellulitis N/A N/A Primary Etiology: Diabetic Wound/Ulcer of the Lower N/A N/A Secondary Etiology: Extremity Chronic Obstructive Pulmonary N/A N/A Comorbid History: Disease (COPD), Coronary Artery Disease, Hypertension, Type II Diabetes 08/28/2022 N/A N/A Date Acquired: 13 N/A N/A Weeks of Treatment: Open N/A N/A Wound Status: No N/A N/A Wound Recurrence: 1x1.4x0.1 N/A N/A Measurements L x W x D (cm) 1.1 N/A N/A A (cm) : rea 0.11 N/A N/A Volume (cm) : -40.10% N/A N/A % Reduction in Area: -39.20% N/A N/A % Reduction in Volume: Full Thickness Without Exposed N/A N/A Classification: Support Structures Small N/A N/A Exudate Amount: Serosanguineous N/A N/A Exudate TypeFaelyn, Sigler Marvella A ([id number]) [id number] Page 5 of 9 red, brown N/A N/A Exudate Color: Distinct, outline attached N/A N/A Wound Margin: None Present (0%) N/A N/A Granulation Amount: Large (67-100%) N/A N/A Necrotic Amount: Eschar, Adherent Slough N/A N/A Necrotic Tissue: Fat Layer (Subcutaneous Tissue): Yes N/A N/A Exposed Structures: Fascia: No Tendon: No Muscle: No Joint: No Bone: No Small (1-33%) N/A  N/A Epithelialization: Excoriation: No N/A N/A Periwound Skin Texture: Induration: No Callus: No Crepitus: No Rash: No Scarring: No Maceration: No N/A N/A Periwound Skin Moisture: Dry/Scaly: No Atrophie Blanche: No N/A N/A Periwound Skin Color: Cyanosis: No Ecchymosis: No Erythema: No Hemosiderin Staining: No Mottled: No Pallor: No Rubor: No No Abnormality N/A N/A Temperature: Treatment Notes Wound #1 (Lower Leg) Wound Laterality: Left, Anterior Cleanser Soap and Water Discharge Instruction: May shower and wash wound with dial antibacterial soap and water prior to dressing change. Vashe 5.8 (oz) Discharge Instruction: Cleanse the wound with Vashe prior to applying a clean dressing using gauze sponges, not tissue or cotton balls. Peri-Wound Care Topical Primary Dressing Hydrofera Blue Ready Transfer Foam, 2.5x2.5 (in/in) Discharge Instruction: Apply directly to wound bed as directed Secondary Dressing Woven Gauze Sponge, Non-Sterile 4x4 in Discharge Instruction: Apply over primary dressing as directed. Secured With Conforming Stretch Gauze Bandage, Sterile 2x75 (in/in) Discharge Instruction: Secure with stretch gauze as directed. 58M Medipore Soft Cloth Surgical T 2x10 (in/yd) ape Discharge Instruction: Secure with tape as directed. Compression Wrap Compression Stockings Add-Ons Electronic Signature(s) Signed: 01/27/2023 4:03:36 PM By: Rufus Sharper MD Entered By: Rufus Sharper on 01/27/2023 10:52:38 Multi-Disciplinary Care Plan Details -------------------------------------------------------------------------------- RASH REENA LABOR ([id number]) [id number] Page 6 of 9 Patient Name: Date of Service: NADEEN, SHIPMAN 01/27/2023 10:15 A M Medical Record Number: [id number] Patient Account Number: [id number] Date of Birth/Sex: Treating RN: 1945-10-12 (79 y.o. JEANELL Drury Nestle Primary Care Avis Mcmahill: Katrinka Senior Other  Clinician: Referring Kelissa Merlin: Treating Laquan Ludden/Extender: Rufus Sharper Katrinka Senior Devra in Treatment: 13 Active Inactive Wound/Skin Impairment Nursing Diagnoses: Impaired tissue integrity Goals: Patient/caregiver will verbalize understanding of skin care regimen Date Initiated: 10/27/2022 Target Resolution Date: 02/25/2023 Goal Status: Active Interventions: Assess ulceration(s) every visit Treatment Activities: Skin care regimen initiated : 10/27/2022 Notes: Electronic Signature(s) Signed: 01/28/2023 12:56:04 PM By: Drury Nestle RN, BSN Entered By: Drury Nestle on 01/27/2023 10:46:53 -------------------------------------------------------------------------------- Pain Assessment Details Patient Name: Date of Service: RASH LARAE RO N A. 01/27/2023 10:15 A M Medical Record Number: [id number] Patient Account Number: [id number] Date of Birth/Sex: Treating RN: [birth year] (78 y.o. F) Primary Care Hervey Wedig: Katrinka Senior Other Clinician: Referring Khalidah Herbold: Treating Wenona Mayville/Extender: Rufus Sharper Katrinka Senior Devra in Treatment: 13 Active Problems Location of Pain Severity and Description of Pain Patient Has Paino No Site Locations Pain Management and Medication Current Pain Management:  JULIONA, VALES A ([id number]) [id number] Page 7 of 9 Notes Only hurts when being pushed on Electronic Signature(s) Signed: 01/27/2023 3:57:02 PM By: Wyn Iha Entered By: Wyn Iha on 01/27/2023 10:32:15 -------------------------------------------------------------------------------- Patient/Caregiver Education Details Patient Name: Date of Service: CLAUDIUS LARAE CLAUDIE LOISE RONAL 1/2/2025andnbsp10:15 A M Medical Record Number: [id number] Patient Account Number: [id number] Date of Birth/Gender: Treating RN: [birth year] (78 y.o. JEANELL Drury Nestle Primary Care Physician: Katrinka Senior Other Clinician: Referring Physician: Treating Physician/Extender:  Rufus Ozell Katrinka Senior Devra in Treatment: 13 Education Assessment Education Provided To: Patient Education Topics Provided Wound/Skin Impairment: Handouts: Caring for Your Ulcer Methods: Explain/Verbal Responses: Reinforcements needed Electronic Signature(s) Signed: 01/28/2023 12:56:04 PM By: Drury Nestle RN, BSN Entered By: Drury Nestle on 01/27/2023 10:47:18 -------------------------------------------------------------------------------- Wound Assessment Details Patient Name: Date of Service: CLAUDIUS LARAE RO N A. 01/27/2023 10:15 A M Medical Record Number: [id number] Patient Account Number: [id number] Date of Birth/Sex: Treating RN: [birth year] (79 y.o. F) Primary Care Laryssa Hassing: Katrinka Senior Other Clinician: Referring Theadora Noyes: Treating Wirt Hemmerich/Extender: Rufus Ozell Katrinka Senior Devra in Treatment: 13 Wound Status Wound Number: 1 Primary Cellulitis Etiology: Wound Location: Left, Anterior Lower Leg Secondary Diabetic Wound/Ulcer of the Lower Extremity Wounding Event: Skin Tear/Laceration Etiology: Date Acquired: 08/28/2022 Wound Open Weeks Of Treatment: 13 Status: Clustered Wound: No Comorbid Chronic Obstructive Pulmonary Disease (COPD), Coronary History: Artery Disease, Hypertension, Type II Diabetes Photos ADELAIDA, REINDEL A ([id number]) [id number] Page 8 of 9 Wound Measurements Length: (cm) 1 Width: (cm) 1.4 Depth: (cm) 0.1 Area: (cm) 1.1 Volume: (cm) 0.11 % Reduction in Area: -40.1% % Reduction in Volume: -39.2% Epithelialization: Small (1-33%) Tunneling: No Undermining: No Wound Description Classification: Full Thickness Without Exposed Support Structures Wound Margin: Distinct, outline attached Exudate Amount: Small Exudate Type: Serosanguineous Exudate Color: red, brown Foul Odor After Cleansing: No Slough/Fibrino Yes Wound Bed Granulation Amount: None Present (0%) Exposed Structure Necrotic Amount: Large  (67-100%) Fascia Exposed: No Necrotic Quality: Eschar, Adherent Slough Fat Layer (Subcutaneous Tissue) Exposed: Yes Tendon Exposed: No Muscle Exposed: No Joint Exposed: No Bone Exposed: No Periwound Skin Texture Texture Color No Abnormalities Noted: No No Abnormalities Noted: No Callus: No Atrophie Blanche: No Crepitus: No Cyanosis: No Excoriation: No Ecchymosis: No Induration: No Erythema: No Rash: No Hemosiderin Staining: No Scarring: No Mottled: No Pallor: No Moisture Rubor: No No Abnormalities Noted: No Dry / Scaly: No Temperature / Pain Maceration: No Temperature: No Abnormality Treatment Notes Wound #1 (Lower Leg) Wound Laterality: Left, Anterior Cleanser Soap and Water Discharge Instruction: May shower and wash wound with dial antibacterial soap and water prior to dressing change. Vashe 5.8 (oz) Discharge Instruction: Cleanse the wound with Vashe prior to applying a clean dressing using gauze sponges, not tissue or cotton balls. Peri-Wound Care Topical Primary Dressing Hydrofera Blue Ready Transfer Foam, 2.5x2.5 (in/in) Discharge Instruction: Apply directly to wound bed as directed Secondary Dressing Woven Gauze Sponge, Non-Sterile 4x4 in Discharge Instruction: Apply over primary dressing as directed. Secured With [employer], Sterile 2x75 (in/in) Masley, Olayinka A ([id number]) [id number] Page 9 of 9 Discharge Instruction: Secure with stretch gauze as directed. 71M Medipore Soft Cloth Surgical T 2x10 (in/yd) ape Discharge Instruction: Secure with tape as directed. Compression Wrap Compression Stockings Add-Ons Electronic Signature(s) Signed: 01/27/2023 3:57:02 PM By: Wyn Iha Entered By: Wyn Iha on 01/27/2023 10:40:47 -------------------------------------------------------------------------------- Vitals Details Patient Name: Date of Service: CLAUDIUS LARAE RO N A. 01/27/2023 10:15 A M Medical  Record Number: [id number] Patient Account Number: [id number] Date of Birth/Sex: Treating RN: 1945-08-15 (78 y.o. F) Primary  Care Young Mulvey: Katrinka Senior Other Clinician: Referring Emiliano Welshans: Treating Myiah Petkus/Extender: Rufus Ozell Katrinka Senior Devra in Treatment: 13 Vital Signs Time Taken: 10:37 Temperature (F): 98.8 Height (in): 61 Pulse (bpm): 70 Weight (lbs): 113 Respiratory Rate (breaths/min): 18 Body Mass Index (BMI): 21.3 Blood Pressure (mmHg): 128/72 Reference Range: 80 - 120 mg / dl Electronic Signature(s) Signed: 01/27/2023 3:57:02 PM By: Wyn Iha Entered By: Wyn Iha on 01/27/2023 10:37:53

## 2023-01-28 NOTE — Progress Notes (Signed)
 ABREA, HENLE A ([id number]) [id number] Page 1 of 6 Visit Report for 01/27/2023 HPI Details Patient Name: Date of Service: Belinda Day, Belinda A. 01/27/2023 10:15 A M Medical Record Number: [id number] Patient Account Number: [id number] Date of Birth/Sex: Treating RN: 1946/01/17 (78 y.o. F) Primary Care Provider: Katrinka Senior Other Clinician: Referring Provider: Treating Provider/Extender: Rufus Ozell Katrinka Senior Devra in Treatment: 13 History of Present Illness HPI Description: 10/27/2022 upon evaluation today patient presents for initial inspection here in the clinic regarding a wound over the left anterior lower extremity. T back up a little bit and gives some history I am going to recount what the patient brought in written out on her sheet with her today which is an o excellent history of the course of this wound. On August 3 the patient sustained an injury to the left leg due to a fall off of a ladder. This was essentially a deep cut and continue to bleed for about 4 days. On August 7 she saw Dr. Jerrold at [facility] and was started on Keflex  at that point. Subsequently on August 14 she went back to [facility] where she was placed on doxycycline 100 mg for 2 rounds. The next time that she was evaluated was actually about 3 weeks later when back at [facility] September 6 she was sent to the hospital due to what appeared to be worsening and started on vancomycin  IV. September 7 she was actually discharged from the hospital with linezolid  600 mg. On September 18 she saw Dr. Evelina who recommended the patient go to [facility] long the next morning. On September 19 she saw infectious disease and they ordered an MRI as well as putting the patient on Dalvance . This was by IV. The next appointment with infectious disease was September 26 at that point it was noted that the patient had a number MRI and the results of that were  reported out on 10-14-2022 which showed that she had soft tissue swelling of the distal lower leg with focal skin thickening and enhancement anteriorly consistent with cellulitis no abscess. There was also a thin periosteal edema and enhancement along the anterior medial aspect of the distal tibia this is stated to be likely reactive no definitive osteomyelitis noted. Subsequently she states that she did follow-up requesting to go to the wound center for further evaluation and was referred to us  today October 2 for evaluation in the meantime she has been using Silvadene cream. Her most recent hemoglobin A1c was 6.6 she does have diabetes but otherwise no major medical problems other than hypertension and COPD that would affect her ability to heal. 11-03-2022 upon evaluation today patient appears to be doing okay currently in regard to her wound. I did actually perform a biopsy last week and came back positive for squamous cell carcinoma. With that being said I discussed with the patient that she is likely can require a full excision of this area and I recommended that she may need to go to the skin surgery center unless we get her in with her dermatologist sooner. I am not sure if they have a Mohs surgeon at [facility] dermatology or not. With that being said we will get a see about get in touch with them and finding out which I think will definitely be of benefit if so because it could be something when she gets in much faster. 11-17-2022 upon evaluation today patient presents for follow-up I last saw her on October 9 since  then she actually have the mobile skin surgery on November 15, 2022 that she has been 2 days ago. This actually went very well the area that they had to remove was not nearly as large as I was fearing this is also and excellent. With that being said I do believe the patient is actually making progress here already towards seeing this improvement although again it is much too early to  tell that it is actually healing currently. There is a lot of bleeding we want a make sure that this does not turn into just a blood clot or harder scabs on the use Xeroform gauze at this point helps keep this soft over the next week we will see where things stand following. I am very pleased however they were able to get her in so clinically that is awesome and I do appreciate that. 11-24-2022 upon evaluation patient's wound is showing signs of improvement this does seem to be improving as far as the overall appearance of the wound is concerned. I feel like that it is slowly clearing up to a good surface underneath were not quite there yet but at the same time I think Xeroform is doing a good job. 11/6; a wound that was initially felt to be traumatic however a biopsy of part of this showed squamous cell carcinoma. She underwent Mohs surgery on October 21. We were seeing her for the postop wound care. Apparently the margins of this were clear. She has been using Xeroform and ZZetuvit 11/13; Mohs surgery site. Xeroform and gauze. Comes in the clinic with a nonviable surface. 11/20; Mohs surgery sites. I changed her last week to Hydrofera Blue and gauze. We have been trying to order her compression stockings but running into trouble with [employer] 12/11; Mohs surgery site on the left leg. This is on the anterior left leg. We have been using Hydrofera Blue and foam she is changing this daily. She missed appointments because of issues related to hypoglycemia while she was having a colonoscopy. We do not have any more information about her compression stockings through [employer] 12/18; Mohs surgery site on the left leg. The wound is come down by half a centimeter in diameter. The surface looks reasonably clean. We have been using Hydrofera Blue and border foam she is changing this herself. She still complains of a stinging pain around the wound although there is no obvious erythema  or warmth. She has not been systemically unwell 01/27/2023. Mohs surgery site on the left lower leg. She comes in today with 100% of a very small wound covered in eschar. She did not want any further debridement she says she is washing this off piece by piece in the shower naturally. We have been using Hydrofera Blue and border foam I gave her antibiotics last time [doxycycline] because of periwound tenderness. She has completed these. Electronic Signature(s) Signed: 01/27/2023 4:03:36 PM By: Rufus Sharper MD Entered By: Rufus Sharper on 01/27/2023 10:56:02 CLAUDIUS MEMS A ([id number]) [id number] Page 2 of 6 -------------------------------------------------------------------------------- Physical Exam Details Patient Name: Date of Service: LAYLONIE, MARZEC A. 01/27/2023 10:15 A M Medical Record Number: [id number] Patient Account Number: [id number] Date of Birth/Sex: Treating RN: April 07, 1945 (78 y.o. F) Primary Care Provider: Katrinka Senior Other Clinician: Referring Provider: Treating Provider/Extender: Rufus Sharper Katrinka Senior Devra in Treatment: 13 Constitutional Sitting or standing Blood Pressure is within target range for patient.. Pulse regular and within target range for patient.SABRA Respirations regular, non-labored and within target range.. Temperature is  normal and within the target range for the patient.SABRA Appears in no distress. Notes Wound exam; left anterior lower leg. This is down to a very tiny area with a separating callus. This could come off on its own and I could have easily remove this but the patient refused there is no evidence of surrounding infection. In fact she could be healed underneath this area.There is no tenderness around this wound no erythema. Electronic Signature(s) Signed: 01/27/2023 4:03:36 PM By: Rufus Sharper MD Entered By: Rufus Sharper on 01/27/2023  10:56:28 -------------------------------------------------------------------------------- Physician Orders Details Patient Name: Date of Service: CLAUDIUS CIVIL RO N A. 01/27/2023 10:15 A M Medical Record Number: [id number] Patient Account Number: [id number] Date of Birth/Sex: Treating RN: 1945-05-21 (78 y.o. JEANELL Drury Nestle Primary Care Provider: Katrinka Senior Other Clinician: Referring Provider: Treating Provider/Extender: Rufus Sharper Katrinka Senior Devra in Treatment: 13 The following information was scribed by: Drury Nestle The information was scribed for: Rufus Sharper Verbal / Phone Orders: No Diagnosis Coding Follow-up Appointments ppointment in 2 weeks. - Dr. Rosan (front office to schedule) Return A Anesthetic (In clinic) Topical Lidocaine  5% applied to wound bed Bathing/ Shower/ Hygiene May shower and wash wound with soap and water. - Keep bandage on when in shower, at the end of shower remove dressing and wash with dial Antibacterial soap. After shower, gentle pat dry and apply Xeroform and form border dressing Additional Orders / Instructions Other: - Punch Biopsy sent to [facility] labs. (10/27/22)- Done Wound Treatment Wound #1 - Lower Leg Wound Laterality: Left, Anterior Cleanser: Soap and Water 1 x Per Day/30 Days Discharge Instructions: May shower and wash wound with dial antibacterial soap and water prior to dressing change. Cleanser: Vashe 5.8 (oz) 1 x Per Day/30 Days Discharge Instructions: Cleanse the wound with Vashe prior to applying a clean dressing using gauze sponges, not tissue or cotton balls. Prim Dressing: Hydrofera Blue Ready Transfer Foam, 2.5x2.5 (in/in) 1 x Per Day/30 Days ary Discharge Instructions: Apply directly to wound bed as directed Secondary Dressing: Woven Gauze Sponge, Non-Sterile 4x4 in 1 x Per Day/30 Days NATHALEE, SMARR A ([id number]) [phone].pdf Page 3 of 6 Discharge Instructions: Apply over primary dressing  as directed. Secured With: Insurance Underwriter, Sterile 2x75 (in/in) 1 x Per Day/30 Days Discharge Instructions: Secure with stretch gauze as directed. Secured With: 89M [employer] Scientist, Research (life Sciences) Surgical T 2x10 (in/yd) 1 x Per Day/30 Days ape Discharge Instructions: Secure with tape as directed. Electronic Signature(s) Signed: 01/27/2023 4:03:36 PM By: Rufus Sharper MD Signed: 01/28/2023 12:56:04 PM By: Drury Nestle RN, BSN Entered By: Drury Nestle on 01/27/2023 10:48:15 -------------------------------------------------------------------------------- Problem List Details Patient Name: Date of Service: CLAUDIUS CIVIL RO N A. 01/27/2023 10:15 A M Medical Record Number: [id number] Patient Account Number: [id number] Date of Birth/Sex: Treating RN: 1945-08-18 (78 y.o. F) Primary Care Provider: Katrinka Senior Other Clinician: Referring Provider: Treating Provider/Extender: Rufus Sharper Katrinka Senior Devra in Treatment: 13 Active Problems ICD-10 Encounter Code Description Active Date MDM Diagnosis [id number] Laceration without foreign body, left lower leg, initial encounter 10/27/2022 No Yes L03.116 Cellulitis of left lower limb 10/27/2022 No Yes E11.622 Type 2 diabetes mellitus with other skin ulcer 10/27/2022 No Yes L97.822 Non-pressure chronic ulcer of other part of left lower leg with fat layer exposed10/02/2022 No Yes I10 Essential (primary) hypertension 10/27/2022 No Yes J44.89 Other specified chronic obstructive pulmonary disease 10/27/2022 No Yes Inactive Problems Resolved Problems Electronic Signature(s) Signed: 01/27/2023 4:03:36 PM By: Rufus Sharper MD Entered By: Rufus Sharper on 01/27/2023 10:52:31 Duchemin,  Alura A [id number]) [id number] Page 4 of 6 -------------------------------------------------------------------------------- Progress Note Details Patient Name: Date of Service: BUFFI, EWTON A. 01/27/2023 10:15 A M Medical Record  Number: [id number] Patient Account Number: [id number] Date of Birth/Sex: Treating RN: 30-Mar-1945 (78 y.o. F) Primary Care Provider: Katrinka Senior Other Clinician: Referring Provider: Treating Provider/Extender: Rufus Ozell Katrinka Senior Devra in Treatment: 13 Subjective History of Present Illness (HPI) 10/27/2022 upon evaluation today patient presents for initial inspection here in the clinic regarding a wound over the left anterior lower extremity. T back up a o little bit and gives some history I am going to recount what the patient brought in written out on her sheet with her today which is an excellent history of the course of this wound. On August 3 the patient sustained an injury to the left leg due to a fall off of a ladder. This was essentially a deep cut and continue to bleed for about 4 days. On August 7 she saw Dr. Jerrold at [facility] and was started on Keflex  at that point. Subsequently on August 14 she went back to [facility] where she was placed on doxycycline 100 mg for 2 rounds. The next time that she was evaluated was actually about 3 weeks later when back at [facility] September 6 she was sent to the hospital due to what appeared to be worsening and started on vancomycin  IV. September 7 she was actually discharged from the hospital with linezolid  600 mg. On September 18 she saw Dr. Evelina who recommended the patient go to [facility] long the next morning. On September 19 she saw infectious disease and they ordered an MRI as well as putting the patient on Dalvance . This was by IV. The next appointment with infectious disease was September 26 at that point it was noted that the patient had a number MRI and the results of that were reported out on 10-14-2022 which showed that she had soft tissue swelling of the distal lower leg with focal skin thickening and enhancement anteriorly consistent with cellulitis no abscess. There was also a thin  periosteal edema and enhancement along the anterior medial aspect of the distal tibia this is stated to be likely reactive no definitive osteomyelitis noted. Subsequently she states that she did follow-up requesting to go to the wound center for further evaluation and was referred to us  today October 2 for evaluation in the meantime she has been using Silvadene cream. Her most recent hemoglobin A1c was 6.6 she does have diabetes but otherwise no major medical problems other than hypertension and COPD that would affect her ability to heal. 11-03-2022 upon evaluation today patient appears to be doing okay currently in regard to her wound. I did actually perform a biopsy last week and came back positive for squamous cell carcinoma. With that being said I discussed with the patient that she is likely can require a full excision of this area and I recommended that she may need to go to the skin surgery center unless we get her in with her dermatologist sooner. I am not sure if they have a Mohs surgeon at [facility] dermatology or not. With that being said we will get a see about get in touch with them and finding out which I think will definitely be of benefit if so because it could be something when she gets in much faster. 11-17-2022 upon evaluation today patient presents for follow-up I last saw her on October 9 since then she  actually have the mobile skin surgery on November 15, 2022 that she has been 2 days ago. This actually went very well the area that they had to remove was not nearly as large as I was fearing this is also and excellent. With that being said I do believe the patient is actually making progress here already towards seeing this improvement although again it is much too early to tell that it is actually healing currently. There is a lot of bleeding we want a make sure that this does not turn into just a blood clot or harder scabs on the use Xeroform gauze at this point helps keep this  soft over the next week we will see where things stand following. I am very pleased however they were able to get her in so clinically that is awesome and I do appreciate that. 11-24-2022 upon evaluation patient's wound is showing signs of improvement this does seem to be improving as far as the overall appearance of the wound is concerned. I feel like that it is slowly clearing up to a good surface underneath were not quite there yet but at the same time I think Xeroform is doing a good job. 11/6; a wound that was initially felt to be traumatic however a biopsy of part of this showed squamous cell carcinoma. She underwent Mohs surgery on October 21. We were seeing her for the postop wound care. Apparently the margins of this were clear. She has been using Xeroform and ZZetuvit 11/13; Mohs surgery site. Xeroform and gauze. Comes in the clinic with a nonviable surface. 11/20; Mohs surgery sites. I changed her last week to Hydrofera Blue and gauze. We have been trying to order her compression stockings but running into trouble with [employer] 12/11; Mohs surgery site on the left leg. This is on the anterior left leg. We have been using Hydrofera Blue and foam she is changing this daily. She missed appointments because of issues related to hypoglycemia while she was having a colonoscopy. We do not have any more information about her compression stockings through [employer] 12/18; Mohs surgery site on the left leg. The wound is come down by half a centimeter in diameter. The surface looks reasonably clean. We have been using Hydrofera Blue and border foam she is changing this herself. She still complains of a stinging pain around the wound although there is no obvious erythema or warmth. She has not been systemically unwell 01/27/2023. Mohs surgery site on the left lower leg. She comes in today with 100% of a very small wound covered in eschar. She did not want any further debridement she says  she is washing this off piece by piece in the shower naturally. We have been using Hydrofera Blue and border foam I gave her antibiotics last time [doxycycline] because of periwound tenderness. She has completed these. Objective Constitutional Sitting or standing Blood Pressure is within target range for patient.. Pulse regular and within target range for patient.SABRA Respirations regular, non-labored and within target range.. Temperature is normal and within the target range for the patient.SABRA Appears in no distress. KIMBERLEA, SCHLAG A ([id number]) [id number] Page 5 of 6 Vitals Time Taken: 10:37 AM, Height: 61 in, Weight: 113 lbs, BMI: 21.3, Temperature: 98.8 F, Pulse: 70 bpm, Respiratory Rate: 18 breaths/min, Blood Pressure: 128/72 mmHg. General Notes: Wound exam; left anterior lower leg. This is down to a very tiny area with a separating callus. This could come off on its own and I could have easily  remove this but the patient refused there is no evidence of surrounding infection. In fact she could be healed underneath this area.There is no tenderness around this wound no erythema. Integumentary (Hair, Skin) Wound #1 status is Open. Original cause of wound was Skin T ear/Laceration. The date acquired was: 08/28/2022. The wound has been in treatment 13 weeks. The wound is located on the Left,Anterior Lower Leg. The wound measures 1cm length x 1.4cm width x 0.1cm depth; 1.1cm^2 area and 0.11cm^3 volume. There is Fat Layer (Subcutaneous Tissue) exposed. There is no tunneling or undermining noted. There is a small amount of serosanguineous drainage noted. The wound margin is distinct with the outline attached to the wound base. There is no granulation within the wound bed. There is a large (67-100%) amount of necrotic tissue within the wound bed including Eschar and Adherent Slough. The periwound skin appearance did not exhibit: Callus, Crepitus, Excoriation, Induration,  Rash, Scarring, Dry/Scaly, Maceration, Atrophie Blanche, Cyanosis, Ecchymosis, Hemosiderin Staining, Mottled, Pallor, Rubor, Erythema. Periwound temperature was noted as No Abnormality. Assessment Active Problems ICD-10 Laceration without foreign body, left lower leg, initial encounter Cellulitis of left lower limb Type 2 diabetes mellitus with other skin ulcer Non-pressure chronic ulcer of other part of left lower leg with fat layer exposed Essential (primary) hypertension Other specified chronic obstructive pulmonary disease Plan Follow-up Appointments: Return Appointment in 2 weeks. - Dr. Rosan (front office to schedule) Anesthetic: (In clinic) Topical Lidocaine  5% applied to wound bed Bathing/ Shower/ Hygiene: May shower and wash wound with soap and water. - Keep bandage on when in shower, at the end of shower remove dressing and wash with dial Antibacterial soap. After shower, gentle pat dry and apply Xeroform and form border dressing Additional Orders / Instructions: Other: - Punch Biopsy sent to [facility] labs. (10/27/22)- Done WOUND #1: - Lower Leg Wound Laterality: Left, Anterior Cleanser: Soap and Water 1 x Per Day/30 Days Discharge Instructions: May shower and wash wound with dial antibacterial soap and water prior to dressing change. Cleanser: Vashe 5.8 (oz) 1 x Per Day/30 Days Discharge Instructions: Cleanse the wound with Vashe prior to applying a clean dressing using gauze sponges, not tissue or cotton balls. Prim Dressing: Hydrofera Blue Ready Transfer Foam, 2.5x2.5 (in/in) 1 x Per Day/30 Days ary Discharge Instructions: Apply directly to wound bed as directed Secondary Dressing: Woven Gauze Sponge, Non-Sterile 4x4 in 1 x Per Day/30 Days Discharge Instructions: Apply over primary dressing as directed. Secured With: Insurance Underwriter, Sterile 2x75 (in/in) 1 x Per Day/30 Days Discharge Instructions: Secure with stretch gauze as directed. Secured With: 43M  [employer] Scientist, Research (life Sciences) Surgical T 2x10 (in/yd) 1 x Per Day/30 Days ape Discharge Instructions: Secure with tape as directed. 1. The patient would not let me remove the separating eschar she had over the wound surface 2. I told her it is possible that this is actually healed although she prefers to do this herself when she is taking a shower. 3. She continues with Hydrofera Blue border foam Electronic Signature(s) Signed: 01/27/2023 4:03:36 PM By: Rufus Sharper MD Entered By: Rufus Sharper on 01/27/2023 10:57:34 SuperBill Details -------------------------------------------------------------------------------- [facility] ([id number]) [id number] Page 6 of 6 Patient Name: Date of Service: YOSHINO, BROCCOLI 01/27/2023 Medical Record Number: [id number] Patient Account Number: [id number] Date of Birth/Sex: Treating RN: October 24, 1945 (78 y.o. JEANELL Drury Nestle Primary Care Provider: Katrinka Senior Other Clinician: Referring Provider: Treating Provider/Extender: Rufus Sharper Katrinka Senior Devra in Treatment: 13 Diagnosis Coding ICD-10 Codes Code  Description S81.812A Laceration without foreign body, left lower leg, initial encounter L03.116 Cellulitis of left lower limb E11.622 Type 2 diabetes mellitus with other skin ulcer L97.822 Non-pressure chronic ulcer of other part of left lower leg with fat layer exposed I10 Essential (primary) hypertension J44.89 Other specified chronic obstructive pulmonary disease Facility Procedures CPT4 Code Description Modifier Quantity [id number] 99213 - WOUND CARE VISIT-LEV 3 EST PT 1 Physician Procedures Quantity CPT4 Code Description Modifier [id number] 99213 - WC PHYS LEVEL 3 - EST PT 1 ICD-10 Diagnosis Description L97.822 Non-pressure chronic ulcer of other part of left lower leg with fat layer exposed L03.116 Cellulitis of left lower limb Electronic Signature(s) Signed: 01/27/2023 4:03:36 PM By: Rufus Sharper  MD Entered By: Rufus Sharper on 01/27/2023 10:58:12

## 2023-02-11 ENCOUNTER — Ambulatory Visit (HOSPITAL_COMMUNITY)
Admission: RE | Admit: 2023-02-11 | Discharge: 2023-02-11 | Disposition: A | Discharge status: Home / Self Care | Payer: Medicare Other | Source: Ambulatory Visit | Attending: Family Medicine

## 2023-02-11 ENCOUNTER — Ambulatory Visit (HOSPITAL_COMMUNITY)
Admission: RE | Admit: 2023-02-11 | Discharge: 2023-02-11 | Disposition: A | Discharge status: Home / Self Care | Payer: Medicare Other | Source: Ambulatory Visit | Attending: Family Medicine | Admitting: Family Medicine

## 2023-02-11 DIAGNOSIS — R059 Cough, unspecified: Secondary | ICD-10-CM | POA: Diagnosis not present

## 2023-02-11 DIAGNOSIS — J449 Chronic obstructive pulmonary disease, unspecified: Secondary | ICD-10-CM | POA: Diagnosis not present

## 2023-02-11 DIAGNOSIS — K219 Gastro-esophageal reflux disease without esophagitis: Secondary | ICD-10-CM | POA: Diagnosis not present

## 2023-02-11 DIAGNOSIS — Z79899 Other long term (current) drug therapy: Secondary | ICD-10-CM | POA: Insufficient documentation

## 2023-02-11 DIAGNOSIS — R053 Chronic cough: Secondary | ICD-10-CM

## 2023-02-11 DIAGNOSIS — I252 Old myocardial infarction: Secondary | ICD-10-CM | POA: Insufficient documentation

## 2023-02-11 DIAGNOSIS — R1310 Dysphagia, unspecified: Secondary | ICD-10-CM | POA: Insufficient documentation

## 2023-02-11 DIAGNOSIS — J479 Bronchiectasis, uncomplicated: Secondary | ICD-10-CM

## 2023-02-14 NOTE — Progress Notes (Signed)
Please let patient know barium swallow showed no aspiration noted and trace penetration of thin liquids  Continue recommendations from speech and language specialist

## 2023-02-17 ENCOUNTER — Encounter (HOSPITAL_BASED_OUTPATIENT_CLINIC_OR_DEPARTMENT_OTHER): Payer: Medicare Other | Admitting: Internal Medicine

## 2023-02-17 DIAGNOSIS — J4489 Other specified chronic obstructive pulmonary disease: Secondary | ICD-10-CM | POA: Diagnosis not present

## 2023-02-17 DIAGNOSIS — I10 Essential (primary) hypertension: Secondary | ICD-10-CM | POA: Diagnosis not present

## 2023-02-17 DIAGNOSIS — S81812A Laceration without foreign body, left lower leg, initial encounter: Secondary | ICD-10-CM

## 2023-02-17 DIAGNOSIS — L03116 Cellulitis of left lower limb: Secondary | ICD-10-CM | POA: Diagnosis not present

## 2023-02-17 DIAGNOSIS — E11622 Type 2 diabetes mellitus with other skin ulcer: Secondary | ICD-10-CM

## 2023-02-17 DIAGNOSIS — L97822 Non-pressure chronic ulcer of other part of left lower leg with fat layer exposed: Secondary | ICD-10-CM | POA: Diagnosis not present

## 2023-03-08 ENCOUNTER — Encounter: Payer: Self-pay | Admitting: Family Medicine

## 2023-03-09 DIAGNOSIS — E119 Type 2 diabetes mellitus without complications: Secondary | ICD-10-CM | POA: Diagnosis not present

## 2023-03-09 DIAGNOSIS — H2513 Age-related nuclear cataract, bilateral: Secondary | ICD-10-CM | POA: Diagnosis not present

## 2023-03-10 ENCOUNTER — Encounter: Payer: Self-pay | Admitting: Family

## 2023-03-10 ENCOUNTER — Ambulatory Visit: Payer: Medicare Other | Admitting: Family

## 2023-03-10 VITALS — BP 143/73 | HR 70 | Temp 97.4°F | Ht 61.0 in | Wt 113.8 lb

## 2023-03-10 DIAGNOSIS — N3090 Cystitis, unspecified without hematuria: Secondary | ICD-10-CM

## 2023-03-10 LAB — POCT URINALYSIS DIPSTICK
Bilirubin, UA: NEGATIVE
Blood, UA: POSITIVE — AB
Glucose, UA: NEGATIVE
Ketones, UA: NEGATIVE
Nitrite, UA: NEGATIVE
Protein, UA: NEGATIVE
Spec Grav, UA: 1.015 (ref 1.010–1.025)
Urobilinogen, UA: 0.2 U/dL
pH, UA: 7 (ref 5.0–8.0)

## 2023-03-10 MED ORDER — NITROFURANTOIN MONOHYD MACRO 100 MG PO CAPS: 100.0000 mg | ORAL_CAPSULE | Freq: Two times a day (BID) | ORAL | 0 refills | Status: DC

## 2023-03-10 NOTE — Progress Notes (Signed)
Patient ID: Belinda Day, female    DOB: 28-Jan-1945, 78 y.o.   MRN: [id number]  Chief Complaint  Patient presents with   Urinary Frequency    Pt c/o urinary frequency and dysuria for 3 days. Has tried acetaminophen for discomfort which did help.        Discussed the use of AI scribe software for clinical note transcription with the patient, who gave verbal consent to proceed.  History of Present Illness   Belinda Day is an 78 year old female who presents with urinary symptoms.  She has been experiencing urinary symptoms for the past three days, characterized by a sensation of needing to urinate every fifteen minutes and dysuria, described as similar to 'an old menstrual kind of pain.' The pain is dull, not debilitating, and absent when sitting. No recent diarrhea or loose stools. She maintains her usual fluid intake, including one cup of coffee, four bottles of water, and three large glasses of diet green tea daily. She has not been on a long trip or held her urine longer than usual recently. She has a history of bronchiectasis and has been taking azithromycin daily for the past two years. She uses Dulera twice a day and has albuterol available for use as needed, though she has used it only once in the past two months. Her symptoms worsen in the summer due to humidity but are manageable during the fall and winter. She recalls a previous six-month period of treatment for an ankle cut that required hospitalizations and surgical interventions, concluding in January. During that time, she was on multiple antibiotics, which she believes may have impacted her immune system.     Assessment & Plan:     Urinary Tract Infection - Frequent urination and dysuria for the past three days. Urinalysis shows trace infection. Discussed the possibility of self-resolution vs need for antibiotic treatment. -Start Macrobid (nitrofurantoin) bid and send urine for culture. -Advise to continue drinking plenty  of fluids. -Per MyChart lab results over weekend: If culture shows no growth, stop Macrobid. If culture shows bacterial growth, continue Macrobid.     Subjective:    Outpatient Medications Prior to Visit  Medication Sig Dispense Refill   albuterol (PROAIR HFA) 108 (90 Base) MCG/ACT inhaler Inhale 2 puffs into the lungs every 6 (six) hours as needed for wheezing or shortness of breath. 1 each 2   alendronate (FOSAMAX) 70 MG tablet Take 1 tablet (70 mg total) by mouth every 7 (seven) days. Take with a full glass of water on an empty stomach. 5 tablet 11   ALPRAZolam (XANAX) 0.25 MG tablet TAKE 1 TABLET BY MOUTH AT BEDTIME AS NEEDED FOR SLEEP. TAKE SPARINGLY FOR DAYTIME ANXIETY, 8 HOURS FROM NIGHTTIME DOSE. DO NOT DRIVE FOR 8 HOURS 95 tablet 0   amitriptyline (ELAVIL) 50 MG tablet Take 1 tablet (50 mg total) by mouth at bedtime. 90 tablet 3   amLODipine (NORVASC) 5 MG tablet Take 1 tablet (5 mg total) by mouth daily. 90 tablet 3   aspirin EC 81 MG tablet Take 81 mg by mouth daily.     atorvastatin (LIPITOR) 80 MG tablet TAKE 1 TABLET(80 MG) BY MOUTH DAILY 90 tablet 1   azithromycin (ZITHROMAX) 250 MG tablet Take 1 tablet (250 mg total) by mouth daily. 30 each 5   calcium carbonate (OS-CAL - DOSED IN MG OF ELEMENTAL CALCIUM) 1250 (500 Ca) MG tablet Take 1 tablet by mouth daily.     cholecalciferol (VITAMIN D3)  25 MCG (1000 UNIT) tablet Take 1,000 Units by mouth daily.     Coenzyme Q10 (COQ10) 200 MG CAPS Take 200 mg by mouth daily.     cyanocobalamin (VITAMIN B12) 1000 MCG tablet Take 1,000 mcg by mouth daily.     Melatonin 5 MG TABS Take 5 mg by mouth at bedtime.     metFORMIN (GLUCOPHAGE) 500 MG tablet TAKE 1 TABLET(500 MG) BY MOUTH TWICE DAILY WITH A MEAL 180 tablet 3   mometasone-formoterol (DULERA) 200-5 MCG/ACT AERO Inhale 2 puffs into the lungs in the morning and at bedtime. 13 g 11   Multiple Vitamin (MULTIVITAMIN) tablet Take 1 tablet by mouth daily.     nebivolol (BYSTOLIC) 2.5 MG tablet  TAKE 1 TABLET(2.5 MG) BY MOUTH DAILY 90 tablet 3   Respiratory Therapy Supplies (FLUTTER) DEVI Use as directed 1 each 0   SYNTHROID 75 MCG tablet TAKE 1 TABLET(75 MCG) BY MOUTH DAILY BEFORE AND BREAKFAST 90 tablet 3   telmisartan (MICARDIS) 80 MG tablet TAKE 1 TABLET(80 MG) BY MOUTH DAILY 90 tablet 3   No facility-administered medications prior to visit.   Past Medical History:  Diagnosis Date   Bronchiectasis    oxygen at night in the past   CAD (coronary artery disease)    Cellulitis    COPD (chronic obstructive pulmonary disease) (HCC) bronchiectasis   Diabetes mellitus without complication (HCC) pre-diabetes   Diverticulitis 01/26/2012   GERD (gastroesophageal reflux disease)    History of shingles 04/25/2012   HTN (hypertension)    Hyperlipidemia    Hypothyroidism    MAI (mycobacterium avium-intracellulare) (HCC)    Medication management 10/21/2022   Neuritis of upper extremity    Osteoporosis 08/14/2011    02/16/17 discuss DEXA. 02.2 at R femur neck23% 10 year risk and hip fracture risk 12%. but #s were elevated  Only takes vitamin D and essentially all areas evaluated on bone density were stable from 2016 to 2018. We opted with the stability to not start fosamax and repeat in 12/2018 or 01/2019. [facility] DEXA  Stable. vitamin D  [facility] DEXA. L femur neck -2.1 in 2016 after -2.2 11/11/08. AP tota*   Substance abuse [facility])    Past Surgical History:  Procedure Laterality Date   COLONOSCOPY WITH PROPOFOL N/A 01/21/2022   Procedure: COLONOSCOPY WITH PROPOFOL;  Surgeon: Meridee Score Netty Starring., MD;  Location: [facility];  Service: Gastroenterology;  Laterality: N/A;   COLONOSCOPY WITH PROPOFOL N/A 01/03/2023   Procedure: COLONOSCOPY WITH PROPOFOL;  Surgeon: Meridee Score Netty Starring., MD;  Location: [facility];  Service: Gastroenterology;  Laterality: N/A;   CORONARY ARTERY BYPASS GRAFT  01/25/2002   x3 CABG   ENDOSCOPIC MUCOSAL RESECTION N/A 01/21/2022   Procedure: ENDOSCOPIC  MUCOSAL RESECTION;  Surgeon: Meridee Score Netty Starring., MD;  Location: [facility];  Service: Gastroenterology;  Laterality: N/A;   HAND SURGERY     Dr. Amanda Pea sept 2022- improved hand movement   HEMOSTASIS CLIP PLACEMENT  01/21/2022   Procedure: HEMOSTASIS CLIP PLACEMENT;  Surgeon: Lemar Lofty., MD;  Location: [facility];  Service: Gastroenterology;;   HOT HEMOSTASIS N/A 01/21/2022   Procedure: HOT HEMOSTASIS (ARGON PLASMA COAGULATION/BICAP);  Surgeon: Lemar Lofty., MD;  Location: [facility];  Service: Gastroenterology;  Laterality: N/A;   POLYPECTOMY  01/21/2022   Procedure: POLYPECTOMY;  Surgeon: Meridee Score Netty Starring., MD;  Location: [facility];  Service: Gastroenterology;;   POLYPECTOMY  01/03/2023   Procedure: POLYPECTOMY;  Surgeon: Lemar Lofty., MD;  Location: [facility];  Service: Gastroenterology;;  SUBMUCOSAL LIFTING INJECTION  01/21/2022   Procedure: SUBMUCOSAL LIFTING INJECTION;  Surgeon: Lemar Lofty., MD;  Location: [facility];  Service: Gastroenterology;;   Allergies  Allergen Reactions   Bactrim [Sulfamethoxazole-Trimethoprim] Hives, Itching and Other (See Comments)    Bruised like areas on body   Ciprofloxacin Other (See Comments)    Body aches   Codeine Nausea Only    REACTION: nausea   Erythromycin Nausea Only    REACTION: nausea   Levaquin [Levofloxacin] Other (See Comments)    REACTION: aches   Tramadol Nausea And Vomiting      Objective:    Physical Exam Vitals and nursing note reviewed.  Constitutional:      Appearance: Normal appearance.  Cardiovascular:     Rate and Rhythm: Normal rate and regular rhythm.  Pulmonary:     Effort: Pulmonary effort is normal.     Breath sounds: Examination of the right-middle field reveals wheezing. Examination of the left-middle field reveals wheezing. Examination of the right-lower field reveals wheezing. Examination of the left-lower field reveals wheezing. Wheezing  (expiratory) present.  Musculoskeletal:        General: Normal range of motion.  Skin:    General: Skin is warm and dry.  Neurological:     Mental Status: She is alert.  Psychiatric:        Mood and Affect: Mood normal.        Behavior: Behavior normal.    BP (!) 143/73 (BP Location: Left Arm, Patient Position: Sitting, Cuff Size: Normal)   Pulse 70   Temp (!) 97.4 F (36.3 C) (Temporal)   Ht 5\' 1"  (1.549 m)   Wt 113 lb 12.8 oz (51.6 kg)   SpO2 98%   BMI 21.50 kg/m  Wt Readings from Last 3 Encounters:  03/10/23 113 lb 12.8 oz (51.6 kg)  01/11/23 111 lb 9.6 oz (50.6 kg)  01/06/23 113 lb 3.2 oz (51.3 kg)       Dulce Sellar, NP

## 2023-03-10 NOTE — Patient Instructions (Signed)
It was very nice to see you today!   As discussed, you have a small UTI, and we are sending the urine out for culture. I am sending an antibiotic to start taking today. Look on MyChart under lab results this weekend and if you see "no growth" then you can stop the antibiotic.  But if you see a bacteria listed (usually in red lettering) then continue the antibiotic. I will send a note confirming this on Monday.  Keep drinking plenty of fluids!      PLEASE NOTE:  If you had any lab tests please let us know if you have not heard back within a few days. You may see your results on MyChart before we have a chance to review them but we will give you a call once they are reviewed by [place]. If we ordered any referrals today, please let us know if you have not heard from their office within the next week.

## 2023-03-12 LAB — URINE CULTURE
MICRO NUMBER:: 16080897
SPECIMEN QUALITY:: ADEQUATE

## 2023-03-13 ENCOUNTER — Encounter: Payer: Self-pay | Admitting: Family

## 2023-03-22 ENCOUNTER — Other Ambulatory Visit: Payer: Self-pay | Admitting: Family

## 2023-03-22 MED ORDER — CEPHALEXIN 500 MG PO CAPS: 500.0000 mg | ORAL_CAPSULE | Freq: Three times a day (TID) | ORAL | 0 refills | Status: DC

## 2023-03-23 ENCOUNTER — Other Ambulatory Visit: Payer: Self-pay | Admitting: Family Medicine

## 2023-03-26 ENCOUNTER — Other Ambulatory Visit: Payer: Self-pay

## 2023-03-26 ENCOUNTER — Emergency Department (HOSPITAL_BASED_OUTPATIENT_CLINIC_OR_DEPARTMENT_OTHER)

## 2023-03-26 ENCOUNTER — Inpatient Hospital Stay (HOSPITAL_BASED_OUTPATIENT_CLINIC_OR_DEPARTMENT_OTHER)
Admission: EM | Admit: 2023-03-26 | Discharge: 2023-03-29 | DRG: 872 | Disposition: A | Discharge status: Home / Self Care | Attending: Internal Medicine | Admitting: Internal Medicine

## 2023-03-26 ENCOUNTER — Observation Stay (HOSPITAL_COMMUNITY)

## 2023-03-26 ENCOUNTER — Encounter (HOSPITAL_BASED_OUTPATIENT_CLINIC_OR_DEPARTMENT_OTHER): Payer: Self-pay | Admitting: Emergency Medicine

## 2023-03-26 DIAGNOSIS — E1165 Type 2 diabetes mellitus with hyperglycemia: Secondary | ICD-10-CM | POA: Diagnosis present

## 2023-03-26 DIAGNOSIS — F419 Anxiety disorder, unspecified: Secondary | ICD-10-CM | POA: Diagnosis present

## 2023-03-26 DIAGNOSIS — G4700 Insomnia, unspecified: Secondary | ICD-10-CM | POA: Diagnosis not present

## 2023-03-26 DIAGNOSIS — Z823 Family history of stroke: Secondary | ICD-10-CM

## 2023-03-26 DIAGNOSIS — A419 Sepsis, unspecified organism: Secondary | ICD-10-CM | POA: Diagnosis present

## 2023-03-26 DIAGNOSIS — Z83438 Family history of other disorder of lipoprotein metabolism and other lipidemia: Secondary | ICD-10-CM

## 2023-03-26 DIAGNOSIS — Z885 Allergy status to narcotic agent status: Secondary | ICD-10-CM

## 2023-03-26 DIAGNOSIS — I10 Essential (primary) hypertension: Secondary | ICD-10-CM | POA: Diagnosis not present

## 2023-03-26 DIAGNOSIS — R300 Dysuria: Secondary | ICD-10-CM | POA: Diagnosis present

## 2023-03-26 DIAGNOSIS — Z8261 Family history of arthritis: Secondary | ICD-10-CM

## 2023-03-26 DIAGNOSIS — N3289 Other specified disorders of bladder: Secondary | ICD-10-CM | POA: Diagnosis not present

## 2023-03-26 DIAGNOSIS — Z87891 Personal history of nicotine dependence: Secondary | ICD-10-CM | POA: Diagnosis not present

## 2023-03-26 DIAGNOSIS — E039 Hypothyroidism, unspecified: Secondary | ICD-10-CM | POA: Diagnosis not present

## 2023-03-26 DIAGNOSIS — R0902 Hypoxemia: Secondary | ICD-10-CM | POA: Diagnosis not present

## 2023-03-26 DIAGNOSIS — Z821 Family history of blindness and visual loss: Secondary | ICD-10-CM

## 2023-03-26 DIAGNOSIS — I2510 Atherosclerotic heart disease of native coronary artery without angina pectoris: Secondary | ICD-10-CM | POA: Diagnosis not present

## 2023-03-26 DIAGNOSIS — J479 Bronchiectasis, uncomplicated: Secondary | ICD-10-CM | POA: Diagnosis not present

## 2023-03-26 DIAGNOSIS — Z951 Presence of aortocoronary bypass graft: Secondary | ICD-10-CM

## 2023-03-26 DIAGNOSIS — M810 Age-related osteoporosis without current pathological fracture: Secondary | ICD-10-CM | POA: Diagnosis not present

## 2023-03-26 DIAGNOSIS — E119 Type 2 diabetes mellitus without complications: Secondary | ICD-10-CM

## 2023-03-26 DIAGNOSIS — Z833 Family history of diabetes mellitus: Secondary | ICD-10-CM | POA: Diagnosis not present

## 2023-03-26 DIAGNOSIS — Z7984 Long term (current) use of oral hypoglycemic drugs: Secondary | ICD-10-CM | POA: Diagnosis not present

## 2023-03-26 DIAGNOSIS — E785 Hyperlipidemia, unspecified: Secondary | ICD-10-CM | POA: Diagnosis not present

## 2023-03-26 DIAGNOSIS — K219 Gastro-esophageal reflux disease without esophagitis: Secondary | ICD-10-CM | POA: Diagnosis present

## 2023-03-26 DIAGNOSIS — Z8619 Personal history of other infectious and parasitic diseases: Secondary | ICD-10-CM

## 2023-03-26 DIAGNOSIS — R112 Nausea with vomiting, unspecified: Secondary | ICD-10-CM | POA: Diagnosis present

## 2023-03-26 DIAGNOSIS — Z79890 Hormone replacement therapy (postmenopausal): Secondary | ICD-10-CM | POA: Diagnosis not present

## 2023-03-26 DIAGNOSIS — Z7982 Long term (current) use of aspirin: Secondary | ICD-10-CM

## 2023-03-26 DIAGNOSIS — Z8249 Family history of ischemic heart disease and other diseases of the circulatory system: Secondary | ICD-10-CM | POA: Diagnosis not present

## 2023-03-26 DIAGNOSIS — E871 Hypo-osmolality and hyponatremia: Secondary | ICD-10-CM

## 2023-03-26 DIAGNOSIS — E860 Dehydration: Secondary | ICD-10-CM | POA: Diagnosis present

## 2023-03-26 DIAGNOSIS — K5730 Diverticulosis of large intestine without perforation or abscess without bleeding: Secondary | ICD-10-CM | POA: Diagnosis not present

## 2023-03-26 DIAGNOSIS — G4734 Idiopathic sleep related nonobstructive alveolar hypoventilation: Secondary | ICD-10-CM | POA: Diagnosis present

## 2023-03-26 DIAGNOSIS — N12 Tubulo-interstitial nephritis, not specified as acute or chronic: Secondary | ICD-10-CM | POA: Diagnosis not present

## 2023-03-26 DIAGNOSIS — Z7951 Long term (current) use of inhaled steroids: Secondary | ICD-10-CM

## 2023-03-26 DIAGNOSIS — R109 Unspecified abdominal pain: Secondary | ICD-10-CM | POA: Diagnosis not present

## 2023-03-26 DIAGNOSIS — Z881 Allergy status to other antibiotic agents status: Secondary | ICD-10-CM

## 2023-03-26 DIAGNOSIS — Z7983 Long term (current) use of bisphosphonates: Secondary | ICD-10-CM

## 2023-03-26 DIAGNOSIS — Z800 Family history of malignant neoplasm of digestive organs: Secondary | ICD-10-CM | POA: Diagnosis not present

## 2023-03-26 DIAGNOSIS — Z79899 Other long term (current) drug therapy: Secondary | ICD-10-CM

## 2023-03-26 LAB — URINALYSIS, ROUTINE W REFLEX MICROSCOPIC
Bacteria, UA: NONE SEEN
Bilirubin Urine: NEGATIVE
Glucose, UA: NEGATIVE mg/dL
Hgb urine dipstick: NEGATIVE
Ketones, ur: NEGATIVE mg/dL
Nitrite: NEGATIVE
Protein, ur: 30 mg/dL — AB
Specific Gravity, Urine: 1.025 (ref 1.005–1.030)
pH: 7.5 (ref 5.0–8.0)

## 2023-03-26 LAB — CBC WITH DIFFERENTIAL/PLATELET
Abs Immature Granulocytes: 0.05 10*3/uL (ref 0.00–0.07)
Basophils Absolute: 0 10*3/uL (ref 0.0–0.1)
Basophils Relative: 0 %
Eosinophils Absolute: 0 10*3/uL (ref 0.0–0.5)
Eosinophils Relative: 0 %
HCT: 36.1 % (ref 36.0–46.0)
Hemoglobin: 11.9 g/dL — ABNORMAL LOW (ref 12.0–15.0)
Immature Granulocytes: 0 %
Lymphocytes Relative: 2 %
Lymphs Abs: 0.3 10*3/uL — ABNORMAL LOW (ref 0.7–4.0)
MCH: 30.7 pg (ref 26.0–34.0)
MCHC: 33 g/dL (ref 30.0–36.0)
MCV: 93.3 fL (ref 80.0–100.0)
Monocytes Absolute: 0.5 10*3/uL (ref 0.1–1.0)
Monocytes Relative: 4 %
Neutro Abs: 13.7 10*3/uL — ABNORMAL HIGH (ref 1.7–7.7)
Neutrophils Relative %: 94 %
Platelets: 392 10*3/uL (ref 150–400)
RBC: 3.87 MIL/uL (ref 3.87–5.11)
RDW: 12.1 % (ref 11.5–15.5)
WBC: 14.6 10*3/uL — ABNORMAL HIGH (ref 4.0–10.5)
nRBC: 0 % (ref 0.0–0.2)

## 2023-03-26 LAB — GLUCOSE, CAPILLARY: Glucose-Capillary: 155 mg/dL — ABNORMAL HIGH (ref 70–99)

## 2023-03-26 LAB — COMPREHENSIVE METABOLIC PANEL
ALT: 11 U/L (ref 0–44)
AST: 18 U/L (ref 15–41)
Albumin: 4.2 g/dL (ref 3.5–5.0)
Alkaline Phosphatase: 73 U/L (ref 38–126)
Anion gap: 11 (ref 5–15)
BUN: 15 mg/dL (ref 8–23)
CO2: 23 mmol/L (ref 22–32)
Calcium: 8.8 mg/dL — ABNORMAL LOW (ref 8.9–10.3)
Chloride: 98 mmol/L (ref 98–111)
Creatinine, Ser: 0.71 mg/dL (ref 0.44–1.00)
GFR, Estimated: >60 mL/min (ref >60)
Glucose, Bld: 191 mg/dL — ABNORMAL HIGH (ref 70–99)
Potassium: 3.8 mmol/L (ref 3.5–5.1)
Sodium: 132 mmol/L — ABNORMAL LOW (ref 135–145)
Total Bilirubin: 0.5 mg/dL (ref 0.0–1.2)
Total Protein: 6.9 g/dL (ref 6.5–8.1)

## 2023-03-26 LAB — RESP PANEL BY RT-PCR (RSV, FLU A&B, COVID)  RVPGX2
Influenza A by PCR: NEGATIVE
Influenza B by PCR: NEGATIVE
Resp Syncytial Virus by PCR: NEGATIVE
SARS Coronavirus 2 by RT PCR: NEGATIVE

## 2023-03-26 LAB — LACTIC ACID, PLASMA: Lactic Acid, Venous: 0.9 mmol/L (ref 0.5–1.9)

## 2023-03-26 MED ORDER — HYDROMORPHONE HCL 1 MG/ML IJ SOLN
0.5000 mg | Freq: Once | INTRAMUSCULAR | Status: AC
  Administered 2023-03-26: 0.5 mg via INTRAVENOUS
  Filled 2023-03-26: qty 1

## 2023-03-26 MED ORDER — ALBUTEROL SULFATE (2.5 MG/3ML) 0.083% IN NEBU
2.5000 mg | INHALATION_SOLUTION | Freq: Four times a day (QID) | RESPIRATORY_TRACT | Status: DC | PRN
  Administered 2023-03-28: 2.5 mg via RESPIRATORY_TRACT
  Filled 2023-03-26: qty 3

## 2023-03-26 MED ORDER — ALBUTEROL SULFATE HFA 108 (90 BASE) MCG/ACT IN AERS: 2.0000 | INHALATION_SPRAY | Freq: Four times a day (QID) | RESPIRATORY_TRACT | Status: DC | PRN

## 2023-03-26 MED ORDER — IOHEXOL 300 MG/ML  SOLN
100.0000 mL | Freq: Once | INTRAMUSCULAR | Status: AC | PRN
  Administered 2023-03-26: 100 mL via INTRAVENOUS

## 2023-03-26 MED ORDER — MOMETASONE FURO-FORMOTEROL FUM 200-5 MCG/ACT IN AERO
2.0000 | INHALATION_SPRAY | Freq: Two times a day (BID) | RESPIRATORY_TRACT | Status: DC
  Administered 2023-03-27 – 2023-03-29 (×6): 2 via RESPIRATORY_TRACT
  Filled 2023-03-26: qty 8.8

## 2023-03-26 MED ORDER — SODIUM CHLORIDE 0.9 % IV SOLN
2.0000 g | Freq: Once | INTRAVENOUS | Status: AC
  Administered 2023-03-26: 2 g via INTRAVENOUS
  Filled 2023-03-26: qty 20

## 2023-03-26 MED ORDER — LACTATED RINGERS IV BOLUS
1000.0000 mL | Freq: Once | INTRAVENOUS | Status: AC
  Administered 2023-03-26: 1000 mL via INTRAVENOUS

## 2023-03-26 MED ORDER — ENOXAPARIN SODIUM 40 MG/0.4ML IJ SOSY
40.0000 mg | PREFILLED_SYRINGE | INTRAMUSCULAR | Status: DC
  Administered 2023-03-27 – 2023-03-29 (×3): 40 mg via SUBCUTANEOUS
  Filled 2023-03-26 (×3): qty 0.4

## 2023-03-26 MED ORDER — ACETAMINOPHEN 325 MG PO TABS
650.0000 mg | ORAL_TABLET | Freq: Four times a day (QID) | ORAL | Status: DC | PRN
  Administered 2023-03-27 – 2023-03-29 (×5): 650 mg via ORAL
  Filled 2023-03-26 (×5): qty 2

## 2023-03-26 MED ORDER — ATORVASTATIN CALCIUM 40 MG PO TABS
80.0000 mg | ORAL_TABLET | Freq: Every day | ORAL | Status: DC
  Administered 2023-03-27 – 2023-03-28 (×3): 80 mg via ORAL
  Filled 2023-03-26 (×3): qty 2

## 2023-03-26 MED ORDER — ACETAMINOPHEN 325 MG PO TABS: 650.0000 mg | ORAL_TABLET | Freq: Four times a day (QID) | ORAL | Status: DC | PRN

## 2023-03-26 MED ORDER — ASPIRIN 81 MG PO TBEC
81.0000 mg | DELAYED_RELEASE_TABLET | Freq: Every day | ORAL | Status: DC
  Administered 2023-03-27 – 2023-03-29 (×3): 81 mg via ORAL
  Filled 2023-03-26 (×3): qty 1

## 2023-03-26 MED ORDER — LEVOTHYROXINE SODIUM 75 MCG PO TABS
75.0000 ug | ORAL_TABLET | Freq: Every day | ORAL | Status: DC
  Administered 2023-03-27 – 2023-03-29 (×3): 75 ug via ORAL
  Filled 2023-03-26 (×2): qty 1
  Filled 2023-03-26: qty 3

## 2023-03-26 MED ORDER — AZITHROMYCIN 250 MG PO TABS
250.0000 mg | ORAL_TABLET | Freq: Every day | ORAL | Status: DC
  Administered 2023-03-27: 250 mg via ORAL
  Filled 2023-03-26: qty 1

## 2023-03-26 MED ORDER — AMLODIPINE BESYLATE 5 MG PO TABS: 5.0000 mg | ORAL_TABLET | Freq: Every day | ORAL | Status: DC

## 2023-03-26 MED ORDER — GUAIFENESIN ER 600 MG PO TB12
1200.0000 mg | ORAL_TABLET | Freq: Every day | ORAL | Status: DC
  Administered 2023-03-27 – 2023-03-29 (×3): 1200 mg via ORAL
  Filled 2023-03-26 (×3): qty 2

## 2023-03-26 MED ORDER — NEBIVOLOL HCL 2.5 MG PO TABS
2.5000 mg | ORAL_TABLET | Freq: Every day | ORAL | Status: DC
  Filled 2023-03-26: qty 1

## 2023-03-26 MED ORDER — AMITRIPTYLINE HCL 25 MG PO TABS
50.0000 mg | ORAL_TABLET | Freq: Every day | ORAL | Status: DC
  Administered 2023-03-27 – 2023-03-28 (×3): 50 mg via ORAL
  Filled 2023-03-26 (×2): qty 1
  Filled 2023-03-26: qty 2

## 2023-03-26 MED ORDER — ONDANSETRON HCL 4 MG PO TABS
4.0000 mg | ORAL_TABLET | Freq: Four times a day (QID) | ORAL | Status: DC | PRN
Start: 2023-03-26 — End: 2023-03-29

## 2023-03-26 MED ORDER — INSULIN ASPART 100 UNIT/ML IJ SOLN: 0.0000 [IU] | Freq: Three times a day (TID) | INTRAMUSCULAR | Status: DC

## 2023-03-26 MED ORDER — MELATONIN 5 MG PO TABS
5.0000 mg | ORAL_TABLET | Freq: Every day | ORAL | Status: DC
  Administered 2023-03-27 – 2023-03-28 (×3): 5 mg via ORAL
  Filled 2023-03-26 (×3): qty 1

## 2023-03-26 MED ORDER — SODIUM CHLORIDE 0.9 % IV SOLN: INTRAVENOUS | Status: AC

## 2023-03-26 MED ORDER — ACETAMINOPHEN 650 MG RE SUPP: 650.0000 mg | Freq: Four times a day (QID) | RECTAL | Status: DC | PRN

## 2023-03-26 MED ORDER — SODIUM CHLORIDE 0.9 % IV SOLN
2.0000 g | INTRAVENOUS | Status: DC
  Administered 2023-03-27 – 2023-03-28 (×2): 2 g via INTRAVENOUS
  Filled 2023-03-26 (×2): qty 20

## 2023-03-26 MED ORDER — ACETAMINOPHEN 325 MG PO TABS
650.0000 mg | ORAL_TABLET | Freq: Once | ORAL | Status: AC
  Administered 2023-03-26: 650 mg via ORAL
  Filled 2023-03-26: qty 2

## 2023-03-26 MED ORDER — ONDANSETRON HCL 4 MG/2ML IJ SOLN
4.0000 mg | Freq: Once | INTRAMUSCULAR | Status: AC
  Administered 2023-03-26: 4 mg via INTRAVENOUS
  Filled 2023-03-26: qty 2

## 2023-03-26 MED ORDER — ONDANSETRON HCL 4 MG/2ML IJ SOLN
4.0000 mg | Freq: Four times a day (QID) | INTRAMUSCULAR | Status: DC | PRN
  Administered 2023-03-26: 4 mg via INTRAVENOUS
  Filled 2023-03-26: qty 2

## 2023-03-26 MED ORDER — ALPRAZOLAM 0.25 MG PO TABS: 0.2500 mg | ORAL_TABLET | Freq: Every evening | ORAL | Status: DC | PRN

## 2023-03-26 NOTE — ED Notes (Signed)
 Patient states aching all over.  Shaking  Pulse ox showing 87%  Dr. Eloise Harman in to evaluate.  Orders received  Fluids started

## 2023-03-26 NOTE — Assessment & Plan Note (Signed)
 Continue medical management  ASA, bystolic and high intensity statin

## 2023-03-26 NOTE — Assessment & Plan Note (Signed)
 Bp soft tonight, will hold her PM telmisartan Continue norvasc 5mg  and bystolic 2.5mg  in AM. Will set parameters to hold if bp low.

## 2023-03-26 NOTE — H&P (Signed)
 History and Physical    Patient: Belinda Day:[id number] DOB: 1945-09-21 DOA: 03/26/2023 DOS: the patient was seen and examined on 03/26/2023 PCP: Shelva Majestic, MD  Patient coming from:  [facility]  - lives with alone. Ambulates independently    Chief Complaint: rigors and left flank/abdominal pain.   HPI: Belinda Day is a 78 y.o. female with medical history significant of bronchiectasis,  COPD, CAD, T2DM, GERD, HTN, HLD, hypothyroidism who was treated for UTI (e.coli by cx) on 2/13 with macrobid x 5 days then with recurrent dysuria given course of keflex on 2/25 who presented to ED with complaints of rigors and left flank/abdominal pain.  She woke up this morning with chill/rigors and a lot of pain on her left lower abdomen/flank. A friend took her to ED. She didn't check her temperature at home.  She has associated nausea and vomiting that also started this morning. She states she has had dysuria, not really urgency or frequency but she hasn't really been able to keep anything down. No blood in her urine or foul smell. Maybe cloudier appearing. She hasn't been able to keep much down today. She has continued to have vomiting even on arrival to [facility].   She has not had a UTI for 20-25 years ago until this February.   Denies any vision changes, + headaches, chest pain or palpitations, shortness of breath or cough, N/V/D, or leg swelling.   She does not drink alcohol or smoke.   ER Course:  vitals: tmax: 101.4, bp: 136/67, HR: 89, RR: 17, oxygen: 94%RA Pertinent labs: wbc: 14.6, sodium: 132, glucose: 191, UA: trace leuko,  CT abdo/pelvis: Patchy cortical hypoenhancement involving the left greater than right kidneys within appearance suspicious for pyelonephritis. Slightly thick-walled urinary bladder with mild perivesical stranding and mucosal enhancement as may be seen with cystitis 2. Minimal tree-in-bud density at left lung base suggesting atypical lung infection 3.  Diverticular disease of the colon without acute inflammatory Process In ED: BC/urine culture obtained. Started on rocephin. Given 1L IVF bolus. TRH asked to admit.     Review of Systems: As mentioned in the history of present illness. All other systems reviewed and are negative. Past Medical History:  Diagnosis Date   Bronchiectasis    oxygen at night in the past   CAD (coronary artery disease)    Cellulitis    COPD (chronic obstructive pulmonary disease) (HCC) bronchiectasis   Diabetes mellitus without complication (HCC) pre-diabetes   Diverticulitis 01/26/2012   GERD (gastroesophageal reflux disease)    History of shingles 04/25/2012   HTN (hypertension)    Hyperlipidemia    Hypothyroidism    MAI (mycobacterium avium-intracellulare) (HCC)    Medication management 10/21/2022   Neuritis of upper extremity    Osteoporosis 08/14/2011    02/16/17 discuss DEXA. 02.2 at R femur neck23% 10 year risk and hip fracture risk 12%. but #s were elevated  Only takes vitamin D and essentially all areas evaluated on bone density were stable from 2016 to 2018. We opted with the stability to not start fosamax and repeat in 12/2018 or 01/2019. SOLIS DEXA  Stable. vitamin D  [facility] DEXA. L femur neck -2.1 in 2016 after -2.2 11/11/08. AP tota*   Substance abuse [facility])    Past Surgical History:  Procedure Laterality Date   COLONOSCOPY WITH PROPOFOL N/A 01/21/2022   Procedure: COLONOSCOPY WITH PROPOFOL;  Surgeon: Meridee Score Netty Starring., MD;  Location: [facility];  Service: Gastroenterology;  Laterality: N/A;  COLONOSCOPY WITH PROPOFOL N/A 01/03/2023   Procedure: COLONOSCOPY WITH PROPOFOL;  Surgeon: Meridee Score Netty Starring., MD;  Location: [facility];  Service: Gastroenterology;  Laterality: N/A;   CORONARY ARTERY BYPASS GRAFT  01/25/2002   x3 CABG   ENDOSCOPIC MUCOSAL RESECTION N/A 01/21/2022   Procedure: ENDOSCOPIC MUCOSAL RESECTION;  Surgeon: Meridee Score Netty Starring., MD;  Location: [facility];   Service: Gastroenterology;  Laterality: N/A;   HAND SURGERY     Dr. Amanda Pea sept 2022- improved hand movement   HEMOSTASIS CLIP PLACEMENT  01/21/2022   Procedure: HEMOSTASIS CLIP PLACEMENT;  Surgeon: Lemar Lofty., MD;  Location: [facility];  Service: Gastroenterology;;   HOT HEMOSTASIS N/A 01/21/2022   Procedure: HOT HEMOSTASIS (ARGON PLASMA COAGULATION/BICAP);  Surgeon: Lemar Lofty., MD;  Location: [facility];  Service: Gastroenterology;  Laterality: N/A;   POLYPECTOMY  01/21/2022   Procedure: POLYPECTOMY;  Surgeon: Meridee Score Netty Starring., MD;  Location: [facility];  Service: Gastroenterology;;   POLYPECTOMY  01/03/2023   Procedure: POLYPECTOMY;  Surgeon: Lemar Lofty., MD;  Location: [facility];  Service: Gastroenterology;;   SUBMUCOSAL LIFTING INJECTION  01/21/2022   Procedure: SUBMUCOSAL LIFTING INJECTION;  Surgeon: Lemar Lofty., MD;  Location: [facility];  Service: Gastroenterology;;   Social History:  reports that she quit smoking about 43 years ago. Her smoking use included cigarettes. She started smoking about 68 years ago. She has a 25 pack-year smoking history. She has never used smokeless tobacco. She reports that she does not drink alcohol and does not use drugs.  Allergies  Allergen Reactions   Bactrim [Sulfamethoxazole-Trimethoprim] Hives, Itching and Other (See Comments)    Bruised like areas on body   Ciprofloxacin Other (See Comments)    Body aches  Other Reaction(s): Not available, Not available, Not available, Not available, Not available   Codeine Nausea Only    REACTION: nausea   Erythromycin Nausea Only    REACTION: nausea   Levaquin [Levofloxacin] Other (See Comments)    REACTION: aches   Tramadol Nausea And Vomiting    Family History  Problem Relation Age of Onset   Colon cancer Mother    Hyperlipidemia Mother    Hypertension Mother    Heart disease Mother    Stroke Mother    Diabetes Mother     Cancer Mother    Colon cancer Father    Arthritis Father    Hyperlipidemia Father    Hypertension Father    Heart disease Father    Stroke Father    Cancer Father    Vision loss Father    Colon cancer Maternal Grandmother    Cancer Maternal Grandmother    Atopy Neg Hx    Stomach cancer Neg Hx    Esophageal cancer Neg Hx    Pancreatic cancer Neg Hx    Inflammatory bowel disease Neg Hx    Liver disease Neg Hx    Rectal cancer Neg Hx     Prior to Admission medications   Medication Sig Start Date End Date Taking? Authorizing Provider  doxycycline (MONODOX) 100 MG capsule Take 100 mg by mouth 2 (two) times daily. 01/12/23  Yes [provider]  albuterol (PROAIR HFA) 108 (90 Base) MCG/ACT inhaler Inhale 2 puffs into the lungs every 6 (six) hours as needed for wheezing or shortness of breath. 08/31/21   Nyoka Cowden, MD  alendronate (FOSAMAX) 70 MG tablet Take 1 tablet (70 mg total) by mouth every 7 (seven) days. Take with a full glass of water on  an empty stomach. 01/06/23   Shelva Majestic, MD  ALPRAZolam Prudy Feeler) 0.25 MG tablet TAKE 1 TABLET BY MOUTH AT BEDTIME AS NEEDED FOR SLEEP. TAKE SPARINGLY FOR DAYTIME ANXIETY, 8 HOURS BETWEEN DOSES. DO NOT DRIVE FOR 8 HOURS 10/08/76   Shelva Majestic, MD  amitriptyline (ELAVIL) 50 MG tablet Take 1 tablet (50 mg total) by mouth at bedtime. 05/27/22   Shelva Majestic, MD  amLODipine (NORVASC) 5 MG tablet Take 1 tablet (5 mg total) by mouth daily. 10/04/22   Lewayne Bunting, MD  aspirin EC 81 MG tablet Take 81 mg by mouth daily.    [provider]  atorvastatin (LIPITOR) 80 MG tablet TAKE 1 TABLET(80 MG) BY MOUTH DAILY 10/11/22   Cannon Kettle, PA-C  calcium carbonate (OS-CAL - DOSED IN MG OF ELEMENTAL CALCIUM) 1250 (500 Ca) MG tablet Take 1 tablet by mouth daily.    [provider]  cephALEXin (KEFLEX) 500 MG capsule Take 1 capsule (500 mg total) by mouth 3 (three) times daily. 03/22/23   Worthy Rancher B, FNP   cholecalciferol (VITAMIN D3) 25 MCG (1000 UNIT) tablet Take 1,000 Units by mouth daily.    [provider]  Coenzyme Q10 (COQ10) 200 MG CAPS Take 200 mg by mouth daily.    [provider]  cyanocobalamin (VITAMIN B12) 1000 MCG tablet Take 1,000 mcg by mouth daily.    [provider]  Melatonin 5 MG TABS Take 5 mg by mouth at bedtime.    [provider]  metFORMIN (GLUCOPHAGE) 500 MG tablet TAKE 1 TABLET(500 MG) BY MOUTH TWICE DAILY WITH A MEAL 05/27/22   Shelva Majestic, MD  mometasone-formoterol [facility]) 200-5 MCG/ACT AERO Inhale 2 puffs into the lungs in the morning and at bedtime. 01/11/23   Glenford Bayley, NP  Multiple Vitamin (MULTIVITAMIN) tablet Take 1 tablet by mouth daily.    [provider]  nebivolol (BYSTOLIC) 2.5 MG tablet TAKE 1 TABLET(2.5 MG) BY MOUTH DAILY 10/26/22   Lewayne Bunting, MD  Respiratory Therapy Supplies (FLUTTER) DEVI Use as directed 09/03/14   Nyoka Cowden, MD  SYNTHROID 75 MCG tablet TAKE 1 TABLET(75 MCG) BY MOUTH DAILY BEFORE AND BREAKFAST 06/30/22   Shelva Majestic, MD  telmisartan (MICARDIS) 80 MG tablet TAKE 1 TABLET(80 MG) BY MOUTH DAILY 11/11/22   Lewayne Bunting, MD    Physical Exam: Vitals:   03/26/23 1952 03/26/23 1959 03/26/23 2000 03/26/23 2054  BP:   (!) 146/116 119/63  Pulse:   (!) 102 92  Resp:    17  Temp: 98.7 F (37.1 C) (!) 101 F (38.3 C)  (!) 101.4 F (38.6 C)  TempSrc: Oral Oral  Oral  SpO2:   97% 98%  Weight:    52.4 kg  Height:    5\' 1"  (1.549 m)   General:  Appears calm and comfortable and is in NAD Eyes:  PERRL, EOMI, normal lids, iris ENT:  grossly normal hearing, lips & tongue, mmm; appropriate dentition Neck:  no LAD, masses or thyromegaly; no carotid bruits Cardiovascular:  RRR, no m/r/g. No LE edema.  Respiratory:   faint crackles in bilateral bases. No wheezing.  Abdomen:  soft, TTP in left lower quadrant., ND, NABS. No rebound or guarding.  Back:   normal alignment,  + LEFT  CVAT Skin:  no rash or induration seen on limited exam Musculoskeletal:  grossly normal tone BUE/BLE, good ROM, no bony abnormality Lower extremity:  No LE edema.  Limited foot exam with no ulcerations.  2+ distal pulses. Psychiatric:  grossly normal mood and affect, speech fluent and appropriate, AOx3 Neurologic:  CN 2-12 grossly intact, moves all extremities in coordinated fashion, sensation intact   Radiological Exams on Admission: Independently reviewed - see discussion in A/P where applicable  CT ABDOMEN PELVIS W CONTRAST Result Date: 03/26/2023 CLINICAL DATA:  Left flank pain EXAM: CT ABDOMEN AND PELVIS WITH CONTRAST TECHNIQUE: Multidetector CT imaging of the abdomen and pelvis was performed using the standard protocol following bolus administration of intravenous contrast. RADIATION DOSE REDUCTION: This exam was performed according to the departmental dose-optimization program which includes automated exposure control, adjustment of the mA and/or kV according to patient size and/or use of iterative reconstruction technique. CONTRAST:  OMNIPAQUE IOHEXOL 300 MG/ML  SOLN COMPARISON:  CT 04/04/2020 FINDINGS: Lower chest: Bronchiectasis and nodular scarring at the right base. Mild tree-in-bud density at the left lung base Hepatobiliary: No calcified gallstone. No biliary dilatation. Subcentimeter hypodensities in the posterior right hepatic lobe too small to further characterize. Pancreas: Unremarkable. No pancreatic ductal dilatation or surrounding inflammatory changes. Spleen: Normal in size without focal abnormality. Adrenals/Urinary Tract: Adrenal glands are normal. Areas of cortical scarring in the kidneys. Patchy cortical hypoenhancement within the left greater than right kidney. No discrete solid mass. Cysts and subcentimeter hypodensities for which no imaging follow-up is recommended. Mucosal enhancement of the bladder with slight wall thickening and perivesical haziness.  Stomach/Bowel: Stomach nonenlarged. Diverticular disease of the colon without acute inflammation. Negative appendix Vascular/Lymphatic: Aortic atherosclerosis. No enlarged abdominal or pelvic lymph nodes. Reproductive: Atrophic uterus.  No adnexal mass Other: Negative for pelvic effusion or free air Musculoskeletal: No acute or suspicious osseous abnormality IMPRESSION: 1. Patchy cortical hypoenhancement involving the left greater than right kidneys within appearance suspicious for pyelonephritis. Slightly thick-walled urinary bladder with mild perivesical stranding and mucosal enhancement as may be seen with cystitis 2. Minimal tree-in-bud density at left lung base suggesting atypical lung infection 3. Diverticular disease of the colon without acute inflammatory process Electronically Signed   By: Jasmine Pang M.D.   On: 03/26/2023 18:13    EKG: pending    Labs on Admission: I have personally reviewed the available labs and imaging studies at the time of the admission.  Pertinent labs:   wbc: 14.6,  sodium: 132,  glucose: 191,  UA: trace leuko,  Assessment and Plan: Principal Problem:   sepsis secondary to Pyelonephritis Active Problems:   Hyponatremia   Nocturnal hypoxemia   Obstructive bronchiectasis (HCC) with GOLD II/III criteria   Essential hypertension   CAD (coronary artery disease) s/p CABG   Type 2 diabetes mellitus without complication, without long-term current use of insulin (HCC)   Hypothyroidism   Insomnia   Hyperlipemia   Sepsis (HCC)    Assessment and Plan: * sepsis secondary to Pyelonephritis 78 year old female presenting to ED with complaints of rigors, left lower abdominal and flank pain, dysuria and nausea/vomiting found to have sepsis with tmax to 101.7 and leukocytosis and findings on CT concerning for pyelonephritis, left >right kidneys in setting of outpatient UTI treated with culture appropriate antibiotics -obs to med surg  -culture on 2/15 sensitive to  macrobid/cephalosporins. Despite course of macrobid and keflex she continued to have symptoms -blood cultures obtained, urine culture pending  -continue rocephin  -received 1L IVF In ED, continue IVF overnight  -lactic acid pending  -anti-pyretics -anti emetics -pain control, although she is requesting tylenol only. Given dilaudid and had episode  of vomiting. Discussed toradol, but she prefers to keep just tylenol for now     Hyponatremia Translocational hyponatremia from hyperglycemia Sodium corrects to normal   Nocturnal hypoxemia History of nocturnal hypoxemia back in 2014, but she states she stopped this in 2015  Was in the 80s while in ED and oxygen placed in the evening  CXR pending, sputum cx pending  Continue night time oxygen and she follow up with her pulm this month    Obstructive bronchiectasis (HCC) with GOLD II/III criteria Continue daily azithromycin  Continue dulera and SABA PRN  No worsening cough or shortness of breath, but with oxygen at night which is new.  With sepsis and reading of minimal tree in bud density at left lung base suggesting atypical pneumonia (although I wonder if chronic changes) will check sputum cx and CXR  Covid/flu and RSV negative  On rocephin/azithromycin already   Essential hypertension Bp soft tonight, will hold her PM telmisartan Continue norvasc 5mg  and bystolic 2.5mg  in AM. Will set parameters to hold if bp low.    CAD (coronary artery disease) s/p CABG Continue medical management  ASA, bystolic and high intensity statin   Type 2 diabetes mellitus without complication, without long-term current use of insulin (HCC) A1C well controlled at 6.2 in 12/2022 Hold metformin  SSI and accuchecks QAC/HS   Hypothyroidism TSH wnl in 12/2022 Continue synthroid daily   Insomnia Continue amytriptyline and melatonin Discussed with her xanax poor choice for insomnia with short half life and rebound insomnia, disrupts deep sleep  and leads to less restorative sleep, can cause daytime sedation and cognitive impairment   Hyperlipemia Continue high intensity statin     Advance Care Planning:   Code Status: Full Code   Consults: none   DVT Prophylaxis: lovenox   Family Communication: none   Severity of Illness: The appropriate patient status for this patient is OBSERVATION. Observation status is judged to be reasonable and necessary in order to provide the required intensity of service to ensure the patient's safety. The patient's presenting symptoms, physical exam findings, and initial radiographic and laboratory data in the context of their medical condition is felt to place them at decreased risk for further clinical deterioration. Furthermore, it is anticipated that the patient will be medically stable for discharge from the hospital within 2 midnights of admission.   Author: Orland Mustard, MD 03/26/2023 10:27 PM  For on call review [email or web address].

## 2023-03-26 NOTE — ED Notes (Signed)
 Abx started after blood cultures collected x2

## 2023-03-26 NOTE — Assessment & Plan Note (Signed)
 TSH wnl in 12/2022 Continue synthroid daily

## 2023-03-26 NOTE — ED Notes (Signed)
 [facility] at bedside

## 2023-03-26 NOTE — Assessment & Plan Note (Signed)
 Translocational hyponatremia from hyperglycemia Sodium corrects to normal

## 2023-03-26 NOTE — ED Notes (Signed)
Attempt to call report.  No answer °

## 2023-03-26 NOTE — Assessment & Plan Note (Addendum)
 History of nocturnal hypoxemia back in 2014, but she states she stopped this in 2015  Was in the 80s while in ED and oxygen placed in the evening  CXR pending, sputum cx pending  Continue night time oxygen and she follow up with her pulm this month

## 2023-03-26 NOTE — ED Triage Notes (Signed)
 Pt being treated for UTI. Pt awoke today with new flank pain/back pain on left side. Started antibiotics . Finished coarse of nitrofuran. Symptoms persisted(pelvic pain, freq and painful urinationa). Started on keflex on the 25th,

## 2023-03-26 NOTE — ED Provider Notes (Signed)
 [place] EMERGENCY DEPARTMENT AT [facility] Provider Note   CSN: [id number] Arrival date & time: 03/26/23  1510     History {Add pertinent medical, surgical, social history, OB history to HPI:1} Chief Complaint  Patient presents with   Flank Pain    Belinda Day is a 78 y.o. female.  78 year old female with a history of diverticulitis, COPD, diabetes, hypertension, and hyperlipidemia who presents emergency department flank pain.  Patient reports that she has been treated for UTI with Macrobid.  Restarted on Keflex 3 days ago after she had persistent dysuria and frequency.  This morning woke up and started having some chills.  No fever that she knows of.  Also started having 9/10 left-sided abdominal and flank pain.  Constant.  Has difficulty characterizing.  No exacerbating or alleviating factors.  Denies nausea vomiting or diarrhea.  Urine culture from 03/10/2023 grew E. coli that was resistant to most penicillins gentamicin and Bactrim.  Was sensitive to fluoroquinolones, Macrobid, and cephalosporins.       Home Medications Prior to Admission medications   Medication Sig Start Date End Date Taking? Authorizing Provider  albuterol (PROAIR HFA) 108 (90 Base) MCG/ACT inhaler Inhale 2 puffs into the lungs every 6 (six) hours as needed for wheezing or shortness of breath. 08/31/21   Nyoka Cowden, MD  alendronate (FOSAMAX) 70 MG tablet Take 1 tablet (70 mg total) by mouth every 7 (seven) days. Take with a full glass of water on an empty stomach. 01/06/23   Shelva Majestic, MD  ALPRAZolam Prudy Feeler) 0.25 MG tablet TAKE 1 TABLET BY MOUTH AT BEDTIME AS NEEDED FOR SLEEP. TAKE SPARINGLY FOR DAYTIME ANXIETY, 8 HOURS BETWEEN DOSES. DO NOT DRIVE FOR 8 HOURS 05/04/79   Shelva Majestic, MD  amitriptyline (ELAVIL) 50 MG tablet Take 1 tablet (50 mg total) by mouth at bedtime. 05/27/22   Shelva Majestic, MD  amLODipine (NORVASC) 5 MG tablet Take 1 tablet (5 mg total) by mouth daily. 10/04/22    Lewayne Bunting, MD  aspirin EC 81 MG tablet Take 81 mg by mouth daily.    [provider]  atorvastatin (LIPITOR) 80 MG tablet TAKE 1 TABLET(80 MG) BY MOUTH DAILY 10/11/22   Cannon Kettle, PA-C  calcium carbonate (OS-CAL - DOSED IN MG OF ELEMENTAL CALCIUM) 1250 (500 Ca) MG tablet Take 1 tablet by mouth daily.    [provider]  cephALEXin (KEFLEX) 500 MG capsule Take 1 capsule (500 mg total) by mouth 3 (three) times daily. 03/22/23   Worthy Rancher B, FNP  cholecalciferol (VITAMIN D3) 25 MCG (1000 UNIT) tablet Take 1,000 Units by mouth daily.    [provider]  Coenzyme Q10 (COQ10) 200 MG CAPS Take 200 mg by mouth daily.    [provider]  cyanocobalamin (VITAMIN B12) 1000 MCG tablet Take 1,000 mcg by mouth daily.    [provider]  Melatonin 5 MG TABS Take 5 mg by mouth at bedtime.    [provider]  metFORMIN (GLUCOPHAGE) 500 MG tablet TAKE 1 TABLET(500 MG) BY MOUTH TWICE DAILY WITH A MEAL 05/27/22   Shelva Majestic, MD  mometasone-formoterol [facility]) 200-5 MCG/ACT AERO Inhale 2 puffs into the lungs in the morning and at bedtime. 01/11/23   Glenford Bayley, NP  Multiple Vitamin (MULTIVITAMIN) tablet Take 1 tablet by mouth daily.    [provider]  nebivolol (BYSTOLIC) 2.5 MG tablet TAKE 1 TABLET(2.5 MG) BY MOUTH DAILY 10/26/22   Crenshaw,  Madolyn Frieze, MD  Respiratory Therapy Supplies (FLUTTER) DEVI Use as directed 09/03/14   Nyoka Cowden, MD  SYNTHROID 75 MCG tablet TAKE 1 TABLET(75 MCG) BY MOUTH DAILY BEFORE AND BREAKFAST 06/30/22   Shelva Majestic, MD  telmisartan (MICARDIS) 80 MG tablet TAKE 1 TABLET(80 MG) BY MOUTH DAILY 11/11/22   Lewayne Bunting, MD      Allergies    Bactrim [sulfamethoxazole-trimethoprim], Ciprofloxacin, Codeine, Erythromycin, Levaquin [levofloxacin], and Tramadol    Review of Systems   Review of Systems  Physical Exam Updated Vital Signs BP 136/67   Pulse 93   Temp 100.2 F (37.9 C)  (Oral)   Resp 14   SpO2 94%  Physical Exam Vitals and nursing note reviewed.  Constitutional:      General: She is not in acute distress.    Appearance: She is well-developed.  HENT:     Head: Normocephalic and atraumatic.     Right Ear: External ear normal.     Left Ear: External ear normal.     Nose: Nose normal.  Eyes:     Extraocular Movements: Extraocular movements intact.     Conjunctiva/sclera: Conjunctivae normal.     Pupils: Pupils are equal, round, and reactive to light.  Pulmonary:     Effort: Pulmonary effort is normal. No respiratory distress.  Abdominal:     General: Abdomen is flat. There is no distension.     Palpations: Abdomen is soft. There is no mass.     Tenderness: There is abdominal tenderness (Left lower quadrant tenderness to palpation). There is left CVA tenderness. There is no right CVA tenderness or guarding.  Musculoskeletal:     Cervical back: Normal range of motion and neck supple.  Skin:    General: Skin is warm and dry.  Neurological:     Mental Status: She is alert and oriented to person, place, and time. Mental status is at baseline.  Psychiatric:        Mood and Affect: Mood normal.     ED Results / Procedures / Treatments   Labs (all labs ordered are listed, but only abnormal results are displayed) Labs Reviewed - No data to display  EKG None  Radiology No results found.  Procedures Procedures  {Document cardiac monitor, telemetry assessment procedure when appropriate:1}  Medications Ordered in ED Medications - No data to display  ED Course/ Medical Decision Making/ A&P   {   Click here for ABCD2, HEART and other calculatorsREFRESH Note before signing :1}                              Medical Decision Making Amount and/or Complexity of Data Reviewed Labs: ordered. Radiology: ordered.  Risk Prescription drug management.   ***  {Document critical care time when appropriate:1} {Document review of labs and clinical  decision tools ie heart score, Chads2Vasc2 etc:1}  {Document your independent review of radiology images, and any outside records:1} {Document your discussion with family members, caretakers, and with consultants:1} {Document social determinants of health affecting pt's care:1} {Document your decision making why or why not admission, treatments were needed:1} Final Clinical Impression(s) / ED Diagnoses Final diagnoses:  None    Rx / DC Orders ED Discharge Orders     None

## 2023-03-26 NOTE — Assessment & Plan Note (Addendum)
 Continue amytriptyline and melatonin Discussed with her xanax poor choice for insomnia with short half life and rebound insomnia, disrupts deep sleep and leads to less restorative sleep, can cause daytime sedation and cognitive impairment

## 2023-03-26 NOTE — Assessment & Plan Note (Signed)
 Continue high intensity statin

## 2023-03-26 NOTE — Assessment & Plan Note (Signed)
 A1C well controlled at 6.2 in 12/2022 Hold metformin  SSI and accuchecks QAC/HS

## 2023-03-26 NOTE — Plan of Care (Signed)
 Plan of Care Note for accepted transfer  Patient: Belinda Day    ZOX:[id number]  DOA: 03/26/2023     Facility requesting transfer: [facility]  Requesting Provider: Rondel Baton, MD  Reason for transfer: Pyelonephritis  Facility course:   76 F with recent UTI E coli (CTX s), who completed course of Macrobid + started Keflex 2/25. Despite this acute L flank pain. UA interestingly no Bacteria but + pyuria. But CT with findings suggestive of pyelo L>R. Also tree-in bud nodularity L base, ? Atypical pna. Currently on CTX. Advised to swab for Flu/COVID/RSV.   Plan of care: The patient is accepted for admission to Med-surg  unit, at [facility]   Author: Dolly Rias, MD  03/26/2023  Check [email or web address] for on-call coverage.  Nursing staff, Please call [facility] Admits & Consults System-Wide number on Amion as soon as patient's arrival, so appropriate admitting provider can evaluate the pt.

## 2023-03-26 NOTE — Assessment & Plan Note (Signed)
 78 year old female presenting to ED with complaints of rigors, left lower abdominal and flank pain, dysuria and nausea/vomiting found to have sepsis with tmax to 101.7 and leukocytosis and findings on CT concerning for pyelonephritis, left >right kidneys in setting of outpatient UTI treated with culture appropriate antibiotics -obs to med surg  -culture on 2/15 sensitive to macrobid/cephalosporins. Despite course of macrobid and keflex she continued to have symptoms -blood cultures obtained, urine culture pending  -continue rocephin  -received 1L IVF In ED, continue IVF overnight  -lactic acid pending  -anti-pyretics -anti emetics -pain control, although she is requesting tylenol only. Given dilaudid and had episode of vomiting. Discussed toradol, but she prefers to keep just tylenol for now

## 2023-03-26 NOTE — Assessment & Plan Note (Addendum)
 Continue daily azithromycin  Continue dulera and SABA PRN  No worsening cough or shortness of breath, but with oxygen at night which is new.  With sepsis and reading of minimal tree in bud density at left lung base suggesting atypical pneumonia (although I wonder if chronic changes) will check sputum cx and CXR  Covid/flu and RSV negative  On rocephin/azithromycin already

## 2023-03-27 DIAGNOSIS — E871 Hypo-osmolality and hyponatremia: Secondary | ICD-10-CM | POA: Diagnosis present

## 2023-03-27 DIAGNOSIS — Z800 Family history of malignant neoplasm of digestive organs: Secondary | ICD-10-CM | POA: Diagnosis not present

## 2023-03-27 DIAGNOSIS — Z79890 Hormone replacement therapy (postmenopausal): Secondary | ICD-10-CM | POA: Diagnosis not present

## 2023-03-27 DIAGNOSIS — G4700 Insomnia, unspecified: Secondary | ICD-10-CM | POA: Diagnosis present

## 2023-03-27 DIAGNOSIS — I10 Essential (primary) hypertension: Secondary | ICD-10-CM | POA: Diagnosis present

## 2023-03-27 DIAGNOSIS — Z7983 Long term (current) use of bisphosphonates: Secondary | ICD-10-CM | POA: Diagnosis not present

## 2023-03-27 DIAGNOSIS — Z7984 Long term (current) use of oral hypoglycemic drugs: Secondary | ICD-10-CM | POA: Diagnosis not present

## 2023-03-27 DIAGNOSIS — A419 Sepsis, unspecified organism: Secondary | ICD-10-CM | POA: Diagnosis present

## 2023-03-27 DIAGNOSIS — Z833 Family history of diabetes mellitus: Secondary | ICD-10-CM | POA: Diagnosis not present

## 2023-03-27 DIAGNOSIS — E785 Hyperlipidemia, unspecified: Secondary | ICD-10-CM | POA: Diagnosis present

## 2023-03-27 DIAGNOSIS — Z951 Presence of aortocoronary bypass graft: Secondary | ICD-10-CM | POA: Diagnosis not present

## 2023-03-27 DIAGNOSIS — R0902 Hypoxemia: Secondary | ICD-10-CM | POA: Diagnosis present

## 2023-03-27 DIAGNOSIS — E039 Hypothyroidism, unspecified: Secondary | ICD-10-CM | POA: Diagnosis present

## 2023-03-27 DIAGNOSIS — M810 Age-related osteoporosis without current pathological fracture: Secondary | ICD-10-CM | POA: Diagnosis present

## 2023-03-27 DIAGNOSIS — N12 Tubulo-interstitial nephritis, not specified as acute or chronic: Secondary | ICD-10-CM | POA: Diagnosis not present

## 2023-03-27 DIAGNOSIS — Z87891 Personal history of nicotine dependence: Secondary | ICD-10-CM | POA: Diagnosis not present

## 2023-03-27 DIAGNOSIS — Z821 Family history of blindness and visual loss: Secondary | ICD-10-CM | POA: Diagnosis not present

## 2023-03-27 DIAGNOSIS — Z7951 Long term (current) use of inhaled steroids: Secondary | ICD-10-CM | POA: Diagnosis not present

## 2023-03-27 DIAGNOSIS — Z7982 Long term (current) use of aspirin: Secondary | ICD-10-CM | POA: Diagnosis not present

## 2023-03-27 DIAGNOSIS — I2510 Atherosclerotic heart disease of native coronary artery without angina pectoris: Secondary | ICD-10-CM | POA: Diagnosis present

## 2023-03-27 DIAGNOSIS — J479 Bronchiectasis, uncomplicated: Secondary | ICD-10-CM | POA: Diagnosis present

## 2023-03-27 DIAGNOSIS — Z8249 Family history of ischemic heart disease and other diseases of the circulatory system: Secondary | ICD-10-CM | POA: Diagnosis not present

## 2023-03-27 DIAGNOSIS — Z823 Family history of stroke: Secondary | ICD-10-CM | POA: Diagnosis not present

## 2023-03-27 DIAGNOSIS — E1165 Type 2 diabetes mellitus with hyperglycemia: Secondary | ICD-10-CM | POA: Diagnosis present

## 2023-03-27 LAB — CBC
HCT: 34.5 % — ABNORMAL LOW (ref 36.0–46.0)
Hemoglobin: 11.2 g/dL — ABNORMAL LOW (ref 12.0–15.0)
MCH: 31.3 pg (ref 26.0–34.0)
MCHC: 32.5 g/dL (ref 30.0–36.0)
MCV: 96.4 fL (ref 80.0–100.0)
Platelets: 318 10*3/uL (ref 150–400)
RBC: 3.58 MIL/uL — ABNORMAL LOW (ref 3.87–5.11)
RDW: 12.3 % (ref 11.5–15.5)
WBC: 15 10*3/uL — ABNORMAL HIGH (ref 4.0–10.5)
nRBC: 0 % (ref 0.0–0.2)

## 2023-03-27 LAB — EXPECTORATED SPUTUM ASSESSMENT W GRAM STAIN, RFLX TO RESP C

## 2023-03-27 LAB — COMPREHENSIVE METABOLIC PANEL
ALT: 14 U/L (ref 0–44)
AST: 20 U/L (ref 15–41)
Albumin: 3 g/dL — ABNORMAL LOW (ref 3.5–5.0)
Alkaline Phosphatase: 68 U/L (ref 38–126)
Anion gap: 10 (ref 5–15)
BUN: 12 mg/dL (ref 8–23)
CO2: 25 mmol/L (ref 22–32)
Calcium: 8.2 mg/dL — ABNORMAL LOW (ref 8.9–10.3)
Chloride: 97 mmol/L — ABNORMAL LOW (ref 98–111)
Creatinine, Ser: 0.73 mg/dL (ref 0.44–1.00)
GFR, Estimated: >60 mL/min (ref >60)
Glucose, Bld: 159 mg/dL — ABNORMAL HIGH (ref 70–99)
Potassium: 4 mmol/L (ref 3.5–5.1)
Sodium: 132 mmol/L — ABNORMAL LOW (ref 135–145)
Total Bilirubin: 0.3 mg/dL (ref 0.0–1.2)
Total Protein: 6.3 g/dL — ABNORMAL LOW (ref 6.5–8.1)

## 2023-03-27 LAB — URINE CULTURE: Culture: NO GROWTH

## 2023-03-27 LAB — GLUCOSE, CAPILLARY
Glucose-Capillary: 61 mg/dL — ABNORMAL LOW (ref 70–99)
Glucose-Capillary: 122 mg/dL — ABNORMAL HIGH (ref 70–99)
Glucose-Capillary: 122 mg/dL — ABNORMAL HIGH (ref 70–99)
Glucose-Capillary: 98 mg/dL (ref 70–99)
Glucose-Capillary: 135 mg/dL — ABNORMAL HIGH (ref 70–99)
Glucose-Capillary: 123 mg/dL — ABNORMAL HIGH (ref 70–99)
Glucose-Capillary: 156 mg/dL — ABNORMAL HIGH (ref 70–99)

## 2023-03-27 LAB — LACTIC ACID, PLASMA: Lactic Acid, Venous: 1.1 mmol/L (ref 0.5–1.9)

## 2023-03-27 MED ORDER — PROCHLORPERAZINE EDISYLATE 10 MG/2ML IJ SOLN
5.0000 mg | Freq: Once | INTRAMUSCULAR | Status: AC | PRN
  Administered 2023-03-27: 5 mg via INTRAVENOUS
  Filled 2023-03-27: qty 2

## 2023-03-27 NOTE — Plan of Care (Signed)
  Problem: Education: Goal: Knowledge of General Education information will improve Description: Including pain rating scale, medication(s)/side effects and non-pharmacologic comfort measures Outcome: Progressing   Problem: Clinical Measurements: Goal: Ability to maintain clinical measurements within normal limits will improve Outcome: Progressing Goal: Will remain free from infection Outcome: Progressing   Problem: Nutrition: Goal: Adequate nutrition will be maintained Outcome: Progressing   Problem: Pain Managment: Goal: General experience of comfort will improve and/or be controlled Outcome: Progressing

## 2023-03-27 NOTE — Care Management Obs Status (Signed)
 MEDICARE OBSERVATION STATUS NOTIFICATION   Patient Details  Name: Belinda Day MRN: [id number] Date of Birth: Jul 21, 1945   Medicare Observation Status Notification Given:  Yes    Howell Rucks, RN 03/27/2023, 10:45 AM

## 2023-03-27 NOTE — Plan of Care (Signed)
  Problem: Education: Goal: Knowledge of General Education information will improve Description: Including pain rating scale, medication(s)/side effects and non-pharmacologic comfort measures Outcome: Progressing   Problem: Clinical Measurements: Goal: Ability to maintain clinical measurements within normal limits will improve Outcome: Progressing   Problem: Activity: Goal: Risk for activity intolerance will decrease Outcome: Progressing   Problem: Coping: Goal: Level of anxiety will decrease Outcome: Progressing   Problem: Elimination: Goal: Will not experience complications related to urinary retention Outcome: Progressing   Problem: Pain Managment: Goal: General experience of comfort will improve and/or be controlled Outcome: Progressing   Problem: Safety: Goal: Ability to remain free from injury will improve Outcome: Progressing   Problem: Metabolic: Goal: Ability to maintain appropriate glucose levels will improve Outcome: Progressing

## 2023-03-27 NOTE — Progress Notes (Signed)
 Hypoglycemic Event  CBG: 61  Treatment: 4 oz juice/soda  Symptoms: Shaky  Follow-up CBG: Time:750 CBG Result:122  Possible Reasons for Event: Inadequate meal intake      Charlcie Cradle

## 2023-03-27 NOTE — Progress Notes (Signed)
 PROGRESS NOTE  Belinda Day ZOX:[id number] DOB: 1945/05/17 DOA: 03/26/2023 PCP: Shelva Majestic, MD   LOS: 0 days   Brief Narrative / Interim history: 78 year old female with bronchiectasis, COPD, CAD, DM 2, HTN, HLD, hypothyroidism who comes into the hospital with fever, chills, rigors, left flank and abdominal pain.  Apparently she was diagnosed with E. coli UTI mid February 2025, treated with Macrobid with improvement in her symptoms, however had recurrent discomfort and had a second course of Keflex.  She felt better while on antibiotics, however got worse again upon finishing.  She was evaluated in the ER and imaging was concerning for bilateral pyelonephritis, left greater than right.  She was placed on IV antibiotics and admitted to the hospital.  Subjective / 24h Interval events: Continues to have intermittent nausea, febrile last night to 101.4.  Urinary symptoms slightly better  Assesement and Plan: Principal Problem:   sepsis secondary to Pyelonephritis Active Problems:   Hyponatremia   Nocturnal hypoxemia   Obstructive bronchiectasis (HCC) with GOLD II/III criteria   Essential hypertension   CAD (coronary artery disease) s/p CABG   Type 2 diabetes mellitus without complication, without long-term current use of insulin (HCC)   Hypothyroidism   Insomnia   Hyperlipemia   Sepsis (HCC)  Principal problem Sepsis due to pyelonephritis -patient was placed on ceftriaxone, 2 g, continue.  Urine cultures as well as blood cultures were sent, monitor -Still febrile, still with nausea, advance diet as tolerated  Active problems Bronchiectasis, COPD-follows with pulmonary as an outpatient.  Was on oxygen in the past about a decade ago but came off of it.  She complains of intermittent shortness of breath at home, currently on oxygen and feels much improved.  Wondering whether she needs oxygen for home.  Will determine in the upcoming days  Essential hypertension-hold home agents  due to soft blood pressures and fevers  Hypothyroidism-continue Synthroid  Hyperlipidemia-continue statin  CAD, history of CABG-no chest pain, this is stable, continue aspirin  DM2-hold metformin, A1c 6.2.  Continue sliding scale  Scheduled Meds:  amitriptyline  50 mg Oral QHS   aspirin EC  81 mg Oral Daily   atorvastatin  80 mg Oral QHS   azithromycin  250 mg Oral Daily   enoxaparin (LOVENOX) injection  40 mg Subcutaneous Q24H   guaiFENesin  1,200 mg Oral Daily   insulin aspart  0-9 Units Subcutaneous TID WC   levothyroxine  75 mcg Oral Q0600   melatonin  5 mg Oral QHS   mometasone-formoterol  2 puff Inhalation BID   Continuous Infusions:  sodium chloride 75 mL/hr at 03/26/23 2307   cefTRIAXone (ROCEPHIN)  IV     PRN Meds:.acetaminophen **OR** acetaminophen, albuterol, ALPRAZolam, ondansetron **OR** ondansetron (ZOFRAN) IV  Current Outpatient Medications  Medication Instructions   albuterol (PROAIR HFA) 108 (90 Base) MCG/ACT inhaler 2 puffs, Inhalation, Every 6 hours PRN   alendronate (FOSAMAX) 70 mg, Oral, Every 7 days, Take with a full glass of water on an empty stomach.   ALPRAZolam (XANAX) 0.25 MG tablet TAKE 1 TABLET BY MOUTH AT BEDTIME AS NEEDED FOR SLEEP. TAKE SPARINGLY FOR DAYTIME ANXIETY, 8 HOURS BETWEEN DOSES. DO NOT DRIVE FOR 8 HOURS   amitriptyline (ELAVIL) 50 mg, Oral, Daily at bedtime   amLODipine (NORVASC) 5 mg, Oral, Daily   aspirin EC 81 mg, Oral, Daily   atorvastatin (LIPITOR) 80 MG tablet TAKE 1 TABLET(80 MG) BY MOUTH DAILY   calcium carbonate (OS-CAL - DOSED IN MG OF ELEMENTAL  CALCIUM) 1250 (500 Ca) MG tablet 1 tablet, Oral, Daily   cholecalciferol (VITAMIN D3) 1,000 Units, Oral, Daily   CoQ10 200 mg, Oral, Daily   cyanocobalamin (VITAMIN B12) 1,000 mcg, Oral, Daily   melatonin 5 mg, Oral, Daily at bedtime   metFORMIN (GLUCOPHAGE) 500 MG tablet TAKE 1 TABLET(500 MG) BY MOUTH TWICE DAILY WITH A MEAL   mometasone-formoterol (DULERA) 200-5 MCG/ACT AERO 2  puffs, Inhalation, 2 times daily   Multiple Vitamin (MULTIVITAMIN) tablet 1 tablet, Oral, Daily   nebivolol (BYSTOLIC) 2.5 MG tablet TAKE 1 TABLET(2.5 MG) BY MOUTH DAILY   Respiratory Therapy Supplies (FLUTTER) DEVI Use as directed   SYNTHROID 75 MCG tablet TAKE 1 TABLET(75 MCG) BY MOUTH DAILY BEFORE AND BREAKFAST   telmisartan (MICARDIS) 80 MG tablet TAKE 1 TABLET(80 MG) BY MOUTH DAILY    Diet Orders (From admission, onward)     Start     Ordered   03/26/23 2125  Diet heart healthy/carb modified Room service appropriate? Yes; Fluid consistency: Thin  Diet effective now       Question Answer Comment  Diet-HS Snack? Nothing   Room service appropriate? Yes   Fluid consistency: Thin      03/26/23 2124            DVT prophylaxis: enoxaparin (LOVENOX) injection 40 mg Start: 03/27/23 1000   Lab Results  Component Value Date   PLT 318 03/27/2023      Code Status: Full Code  Family Communication: No family at bedside  Status is: Observation The patient will require care spanning > 2 midnights and should be moved to inpatient because: Still febrile, IV antibiotics   Level of care: Med-Surg  Consultants:  None  Objective: Vitals:   03/27/23 0511 03/27/23 0526 03/27/23 0608 03/27/23 0942  BP: (!) 105/42 (!) 117/58  (!) 102/49  Pulse: 69 63  90  Resp: 18 17  15   Temp: 98.7 F (37.1 C)   (!) 100.4 F (38 C)  TempSrc: Oral   Oral  SpO2: 100% 100% 96% 100%  Weight:      Height:        Intake/Output Summary (Last 24 hours) at 03/27/2023 1217 Last data filed at 03/27/2023 0827 Gross per 24 hour  Intake 2024.92 ml  Output 750 ml  Net 1274.92 ml   Wt Readings from Last 3 Encounters:  03/26/23 52.4 kg  03/10/23 51.6 kg  01/11/23 50.6 kg    Examination:  Constitutional: NAD Eyes: no scleral icterus ENMT: Mucous membranes are moist.  Neck: normal, supple Respiratory: clear to auscultation bilaterally, no wheezing, no crackles. Normal respiratory effort. No  accessory muscle use.  Cardiovascular: Regular rate and rhythm, no murmurs / rubs / gallops. No LE edema.  Abdomen: non distended, mild tenderness on the left upper and lower quadrants Musculoskeletal: no clubbing / cyanosis.   Data Reviewed: I have independently reviewed following labs and imaging studies   CBC Recent Labs  Lab 03/26/23 1557 03/27/23 0119  WBC 14.6* 15.0*  HGB 11.9* 11.2*  HCT 36.1 34.5*  PLT 392 318  MCV 93.3 96.4  MCH 30.7 31.3  MCHC 33.0 32.5  RDW 12.1 12.3  LYMPHSABS 0.3*  --   MONOABS 0.5  --   EOSABS 0.0  --   BASOSABS 0.0  --     Recent Labs  Lab 03/26/23 1557 03/26/23 2233 03/27/23 0119  NA 132*  --  132*  K 3.8  --  4.0  CL 98  --  97*  CO2 23  --  25  GLUCOSE 191*  --  159*  BUN 15  --  12  CREATININE 0.71  --  0.73  CALCIUM 8.8*  --  8.2*  AST 18  --  20  ALT 11  --  14  ALKPHOS 73  --  68  BILITOT 0.5  --  0.3  ALBUMIN 4.2  --  3.0*  LATICACIDVEN  --  0.9 1.1    ------------------------------------------------------------------------------------------------------------------ No results for input(s): "CHOL", "HDL", "LDLCALC", "TRIG", "CHOLHDL", "LDLDIRECT" in the last 72 hours.  Lab Results  Component Value Date   HGBA1C 6.2 01/06/2023   ------------------------------------------------------------------------------------------------------------------ No results for input(s): "TSH", "T4TOTAL", "T3FREE", "THYROIDAB" in the last 72 hours.  Invalid input(s): "FREET3"  Cardiac Enzymes No results for input(s): "CKMB", "TROPONINI", "MYOGLOBIN" in the last 168 hours.  Invalid input(s): "CK" ------------------------------------------------------------------------------------------------------------------    Component Value Date/Time   BNP 226.9 (H) 04/17/2015 1810    CBG: Recent Labs  Lab 03/26/23 2214 03/27/23 0714 03/27/23 0748 03/27/23 1151  GLUCAP 155* 61* 122* 122*    Recent Results (from the past 240 hours)   Blood culture (routine x 2)     Status: None (Preliminary result)   Collection Time: 03/26/23  4:09 PM   Specimen: BLOOD LEFT ARM  Result Value Ref Range Status   Specimen Description   Final    BLOOD LEFT ARM Performed at [facility], [address], [place], [place] [address]    Special Requests   Final    BOTTLES DRAWN AEROBIC AND ANAEROBIC Blood Culture adequate volume Performed at [facility], [address] [place], [place] [address]    Culture   Final    NO GROWTH < 12 HOURS Performed at [facility], [address] [place], [place] [address]    Report Status PENDING  Incomplete  Blood culture (routine x 2)     Status: None (Preliminary result)   Collection Time: 03/26/23  6:41 PM   Specimen: BLOOD RIGHT ARM  Result Value Ref Range Status   Specimen Description   Final    BLOOD RIGHT ARM Performed at [facility], [address], [place], [place] [address]    Special Requests   Final    BOTTLES DRAWN AEROBIC AND ANAEROBIC Blood Culture adequate volume Performed at [facility], [address] [place], [place] [address]    Culture   Final    NO GROWTH < 12 HOURS Performed at [facility], [address], [place], [place] [address]    Report Status PENDING  Incomplete  Resp panel by RT-PCR (RSV, Flu A&B, Covid) Anterior Nasal Swab     Status: None   Collection Time: 03/26/23  7:59 PM   Specimen: Anterior Nasal Swab  Result Value Ref Range Status   SARS Coronavirus 2 by RT PCR NEGATIVE NEGATIVE Final    Comment: (NOTE) SARS-CoV-2 target nucleic acids are NOT DETECTED.  The SARS-CoV-2 RNA is generally detectable in upper respiratory specimens during the acute phase of infection. The lowest concentration of SARS-CoV-2 viral copies this assay can detect is 138 copies/mL. A negative result does not preclude SARS-Cov-2 infection and should not be used as the sole basis for treatment or other patient  management decisions. A negative result may occur with  improper specimen collection/handling, submission of specimen other than nasopharyngeal swab, presence of viral mutation(s) within the areas targeted by this assay, and inadequate number of  viral copies(<138 copies/mL). A negative result must be combined with clinical observations, patient history, and epidemiological information. The expected result is Negative.  Fact Sheet for Patients:  BloggerCourse.com  Fact Sheet for Healthcare Providers:  SeriousBroker.it  This test is no t yet approved or cleared by the [place] FDA and  has been authorized for detection and/or diagnosis of SARS-CoV-2 by FDA under an Emergency Use Authorization (EUA). This EUA will remain  in effect (meaning this test can be used) for the duration of the COVID-19 declaration under Section 564(b)(1) of the Act, 21 U.S.C.section 360bbb-3(b)(1), unless the authorization is terminated  or revoked sooner.       Influenza A by PCR NEGATIVE NEGATIVE Final   Influenza B by PCR NEGATIVE NEGATIVE Final    Comment: (NOTE) The Xpert Xpress SARS-CoV-2/FLU/RSV plus assay is intended as an aid in the diagnosis of influenza from Nasopharyngeal swab specimens and should not be used as a sole basis for treatment. Nasal washings and aspirates are unacceptable for Xpert Xpress SARS-CoV-2/FLU/RSV testing.  Fact Sheet for Patients: BloggerCourse.com  Fact Sheet for Healthcare Providers: SeriousBroker.it  This test is not yet approved or cleared by the [place] FDA and has been authorized for detection and/or diagnosis of SARS-CoV-2 by FDA under an Emergency Use Authorization (EUA). This EUA will remain in effect (meaning this test can be used) for the duration of the COVID-19 declaration under Section 564(b)(1) of the Act, 21 U.S.C. section 360bbb-3(b)(1),  unless the authorization is terminated or revoked.     Resp Syncytial Virus by PCR NEGATIVE NEGATIVE Final    Comment: (NOTE) Fact Sheet for Patients: BloggerCourse.com  Fact Sheet for Healthcare Providers: SeriousBroker.it  This test is not yet approved or cleared by the [place] FDA and has been authorized for detection and/or diagnosis of SARS-CoV-2 by FDA under an Emergency Use Authorization (EUA). This EUA will remain in effect (meaning this test can be used) for the duration of the COVID-19 declaration under Section 564(b)(1) of the Act, 21 U.S.C. section 360bbb-3(b)(1), unless the authorization is terminated or revoked.  Performed at [facility], [address] [place], [place] [address]      Radiology Studies: DG CHEST PORT 1 VIEW Result Date: 03/26/2023 CLINICAL DATA:  Hypoxia EXAM: PORTABLE CHEST 1 VIEW COMPARISON:  09/08/2022 FINDINGS: Cardiac shadow is stable. Postsurgical changes are again seen. Scarring is noted in the lungs bilaterally. No focal infiltrate or effusion is seen. No bony abnormality is noted. IMPRESSION: Chronic changes without acute abnormality. Electronically Signed   By: Alcide Clever M.D.   On: 03/26/2023 23:11   CT ABDOMEN PELVIS W CONTRAST Result Date: 03/26/2023 CLINICAL DATA:  Left flank pain EXAM: CT ABDOMEN AND PELVIS WITH CONTRAST TECHNIQUE: Multidetector CT imaging of the abdomen and pelvis was performed using the standard protocol following bolus administration of intravenous contrast. RADIATION DOSE REDUCTION: This exam was performed according to the departmental dose-optimization program which includes automated exposure control, adjustment of the mA and/or kV according to patient size and/or use of iterative reconstruction technique. CONTRAST:  OMNIPAQUE IOHEXOL 300 MG/ML  SOLN COMPARISON:  CT 04/04/2020 FINDINGS: Lower chest: Bronchiectasis and nodular scarring at  the right base. Mild tree-in-bud density at the left lung base Hepatobiliary: No calcified gallstone. No biliary dilatation. Subcentimeter hypodensities in the posterior right hepatic lobe too small to further characterize. Pancreas: Unremarkable. No pancreatic ductal dilatation or surrounding inflammatory changes. Spleen: Normal in size without focal abnormality. Adrenals/Urinary Tract: Adrenal glands are normal.  Areas of cortical scarring in the kidneys. Patchy cortical hypoenhancement within the left greater than right kidney. No discrete solid mass. Cysts and subcentimeter hypodensities for which no imaging follow-up is recommended. Mucosal enhancement of the bladder with slight wall thickening and perivesical haziness. Stomach/Bowel: Stomach nonenlarged. Diverticular disease of the colon without acute inflammation. Negative appendix Vascular/Lymphatic: Aortic atherosclerosis. No enlarged abdominal or pelvic lymph nodes. Reproductive: Atrophic uterus.  No adnexal mass Other: Negative for pelvic effusion or free air Musculoskeletal: No acute or suspicious osseous abnormality IMPRESSION: 1. Patchy cortical hypoenhancement involving the left greater than right kidneys within appearance suspicious for pyelonephritis. Slightly thick-walled urinary bladder with mild perivesical stranding and mucosal enhancement as may be seen with cystitis 2. Minimal tree-in-bud density at left lung base suggesting atypical lung infection 3. Diverticular disease of the colon without acute inflammatory process Electronically Signed   By: Jasmine Pang M.D.   On: 03/26/2023 18:13     Pamella Pert, MD, PhD [facility]  Between 7 am - 7 pm I am available, please contact me via Amion (for emergencies) or Securechat (non urgent messages)  Between 7 pm - 7 am I am not available, please contact night coverage MD/APP via Amion

## 2023-03-27 NOTE — TOC Initial Note (Signed)
 Transition of Care [facility]) - Initial/Assessment Note    Patient Details  Name: Belinda Day MRN: [id number] Date of Birth: 05/20/1945  Transition of [facility]) CM/SW Contact:    Howell Rucks, RN Phone Number: 03/27/2023, 11:59 AM  Clinical Narrative:   Met with pt at bedside to introduce role of TOC/NCM and review for dc planning, pt reports she has an established PCP and pharmacy, no current home care services or home DME, reports she resides alone on a townhome with good support from her neighbors. MOON completed. TOC will continue to follow.                 Expected Discharge Plan: Home/Self Care Barriers to Discharge: Continued Medical Work up   Patient Goals and CMS Choice Patient states their goals for this hospitalization and ongoing recovery are:: return home          Expected Discharge Plan and Services       Living arrangements for the past 2 months: Single Family Home                                      Prior Living Arrangements/Services Living arrangements for the past 2 months: Single Family Home Lives with:: Self Patient language and need for interpreter reviewed:: Yes Do you feel safe going back to the place where you live?: Yes      Need for Family Participation in Patient Care: Yes (Comment) Care giver support system in place?: Yes (comment)   Criminal Activity/Legal Involvement Pertinent to Current Situation/Hospitalization: No - Comment as needed  Activities of Daily Living   ADL Screening (condition at time of admission) Independently performs ADLs?: Yes (appropriate for developmental age) Is the patient deaf or have difficulty hearing?: No Does the patient have difficulty seeing, even when wearing glasses/contacts?: No Does the patient have difficulty concentrating, remembering, or making decisions?: No  Permission Sought/Granted                  Emotional Assessment Appearance:: Appears stated  age Attitude/Demeanor/Rapport: Gracious Affect (typically observed): Accepting Orientation: : Oriented to Self, Oriented to Place, Oriented to  Time, Oriented to Situation Alcohol / Substance Use: Not Applicable Psych Involvement: No (comment)  Admission diagnosis:  Pyelonephritis [N12] Patient Active Problem List   Diagnosis Date Noted   sepsis secondary to Pyelonephritis 03/26/2023   Sepsis (HCC) 03/26/2023   Hyponatremia 03/26/2023   Medication management 10/21/2022   Cellulitis of left lower extremity 10/13/2022   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   Abnormal colonoscopy 12/05/2021   Hx of adenomatous colonic polyps 12/05/2021   Family history of colon cancer 12/05/2021   Aortic atherosclerosis (HCC) 05/28/2020   Type 2 diabetes mellitus without complication, without long-term current use of insulin (HCC) 09/07/2017   Cystitis 11/05/2016   Cerebrovascular disease 01/31/2015   Former smoker 08/05/2014   Hyperglycemia 08/05/2014   Constipation 05/02/2014   History of colonic polyps 05/02/2014   Hemoptysis 02/24/2014   Benign paroxysmal positional vertigo 04/18/2013   Insomnia 02/12/2013   Diverticulitis 12/21/2012   Nocturnal hypoxemia 12/12/2012   Fibromyalgia 12/28/2011   Osteoporosis 08/14/2011   Hypothyroidism 04/06/2010   MAI (mycobacterium avium-intracellulare) (HCC) 11/19/2009   Hyperlipemia 07/03/2007   Essential hypertension 07/03/2007   CAD (coronary artery disease) s/p CABG 07/03/2007   Obstructive bronchiectasis (HCC) with GOLD II/III criteria 07/03/2007   PCP:  Shelva Majestic, MD Pharmacy:  [id number] [place], [place] - [address] [facility] [address] [facility] [address] [facility] [address] [place] [address] Phone: [phone] Fax: [phone]     Social Drivers of Health (SDOH) Social History: SDOH Screenings   Food Insecurity: No Food Insecurity (03/26/2023)  Housing: Low Risk  (03/26/2023)  Transportation Needs: No  Transportation Needs (03/26/2023)  Utilities: Not At Risk (03/26/2023)  Depression (PHQ2-9): Low Risk  (01/06/2023)  Financial Resource Strain: Low Risk  (03/09/2023)  Physical Activity: Insufficiently Active (03/09/2023)  Social Connections: Moderately Integrated (03/26/2023)  Recent Concern: Social Connections - Moderately Isolated (03/09/2023)  Stress: No Stress Concern Present (03/09/2023)  Tobacco Use: Medium Risk (03/26/2023)   SDOH Interventions:     Readmission Risk Interventions     No data to display

## 2023-03-28 DIAGNOSIS — N12 Tubulo-interstitial nephritis, not specified as acute or chronic: Secondary | ICD-10-CM | POA: Diagnosis not present

## 2023-03-28 LAB — CBC
HCT: 31.9 % — ABNORMAL LOW (ref 36.0–46.0)
Hemoglobin: 10 g/dL — ABNORMAL LOW (ref 12.0–15.0)
MCH: 31.3 pg (ref 26.0–34.0)
MCHC: 31.3 g/dL (ref 30.0–36.0)
MCV: 99.7 fL (ref 80.0–100.0)
Platelets: 280 10*3/uL (ref 150–400)
RBC: 3.2 MIL/uL — ABNORMAL LOW (ref 3.87–5.11)
RDW: 12.8 % (ref 11.5–15.5)
WBC: 12.3 10*3/uL — ABNORMAL HIGH (ref 4.0–10.5)
nRBC: 0 % (ref 0.0–0.2)

## 2023-03-28 LAB — COMPREHENSIVE METABOLIC PANEL
ALT: 13 U/L (ref 0–44)
AST: 19 U/L (ref 15–41)
Albumin: 2.6 g/dL — ABNORMAL LOW (ref 3.5–5.0)
Alkaline Phosphatase: 67 U/L (ref 38–126)
Anion gap: 8 (ref 5–15)
BUN: 9 mg/dL (ref 8–23)
CO2: 24 mmol/L (ref 22–32)
Calcium: 8 mg/dL — ABNORMAL LOW (ref 8.9–10.3)
Chloride: 104 mmol/L (ref 98–111)
Creatinine, Ser: 0.72 mg/dL (ref 0.44–1.00)
GFR, Estimated: >60 mL/min (ref >60)
Glucose, Bld: 100 mg/dL — ABNORMAL HIGH (ref 70–99)
Potassium: 3.8 mmol/L (ref 3.5–5.1)
Sodium: 136 mmol/L (ref 135–145)
Total Bilirubin: 0.4 mg/dL (ref 0.0–1.2)
Total Protein: 5.6 g/dL — ABNORMAL LOW (ref 6.5–8.1)

## 2023-03-28 LAB — GLUCOSE, CAPILLARY
Glucose-Capillary: 84 mg/dL (ref 70–99)
Glucose-Capillary: 126 mg/dL — ABNORMAL HIGH (ref 70–99)
Glucose-Capillary: 104 mg/dL — ABNORMAL HIGH (ref 70–99)
Glucose-Capillary: 115 mg/dL — ABNORMAL HIGH (ref 70–99)

## 2023-03-28 LAB — PHOSPHORUS: Phosphorus: 2.2 mg/dL — ABNORMAL LOW (ref 2.5–4.6)

## 2023-03-28 LAB — MAGNESIUM: Magnesium: 2.4 mg/dL (ref 1.7–2.4)

## 2023-03-28 MED ORDER — BENZONATATE 100 MG PO CAPS
100.0000 mg | ORAL_CAPSULE | Freq: Once | ORAL | Status: AC
  Administered 2023-03-28: 100 mg via ORAL
  Filled 2023-03-28: qty 1

## 2023-03-28 MED ORDER — POTASSIUM PHOSPHATES 15 MMOLE/5ML IV SOLN
15.0000 mmol | Freq: Once | INTRAVENOUS | Status: AC
  Administered 2023-03-28: 15 mmol via INTRAVENOUS
  Filled 2023-03-28: qty 5

## 2023-03-28 NOTE — Progress Notes (Signed)
 03/28/2023  0927  Called RT [phone] spoke with Lyla Son. Informed her to pass along to night RT that patient will need a night time continuous pulse ox off of O2. Per RT she sees the order and will pass along.

## 2023-03-28 NOTE — Progress Notes (Signed)
 Pt set up on overnight pulse ox study.  Pt is aware that RT will return in the am and retrieve machine to download results for MD to review.

## 2023-03-28 NOTE — Progress Notes (Signed)
 PROGRESS NOTE  Belinda Day WUJ:[id number] DOB: 1945/02/15 DOA: 03/26/2023 PCP: Shelva Majestic, MD   LOS: 1 day   Brief Narrative / Interim history: 78 year old female with bronchiectasis, COPD, CAD, DM 2, HTN, HLD, hypothyroidism who comes into the hospital with fever, chills, rigors, left flank and abdominal pain.  Apparently she was diagnosed with E. coli UTI mid February 2025, treated with Macrobid with improvement in her symptoms, however had recurrent discomfort and had a second course of Keflex.  She felt better while on antibiotics, however got worse again upon finishing.  She was evaluated in the ER and imaging was concerning for bilateral pyelonephritis, left greater than right.  She was placed on IV antibiotics and admitted to the hospital.  Subjective / 24h Interval events: Fever curve is improving, she is feeling better.  Less nausea, less flank pain  Assesement and Plan: Principal Problem:   sepsis secondary to Pyelonephritis Active Problems:   Hyponatremia   Nocturnal hypoxemia   Obstructive bronchiectasis (HCC) with GOLD II/III criteria   Essential hypertension   CAD (coronary artery disease) s/p CABG   Type 2 diabetes mellitus without complication, without long-term current use of insulin (HCC)   Hypothyroidism   Insomnia   Hyperlipemia   Sepsis (HCC)  Principal problem Sepsis due to pyelonephritis -patient was placed on ceftriaxone, 2 g, continue.  Urine cultures as well as blood cultures were sent, monitor, so far no growth, urine cultures have remained negative however she has recently been on antibiotics -Still with low-grade temp but overall fever curve is improving.  White count improving as well  Active problems Bronchiectasis, COPD-follows with pulmonary as an outpatient.  Was on oxygen in the past about a decade ago but came off of it.  She complains of intermittent shortness of breath at home, currently on oxygen and feels much improved.  Wondering  whether she needs oxygen for home. -Obtain nighttime pulse ox as well as oxygen level with ambulation  Essential hypertension-hold home agents due to soft blood pressures and fevers  Hypothyroidism-continue Synthroid  Hyperlipidemia-continue statin  CAD, history of CABG-no chest pain, this is stable, continue aspirin  DM2-hold metformin, A1c 6.2.  Continue sliding scale  Scheduled Meds:  amitriptyline  50 mg Oral QHS   aspirin EC  81 mg Oral Daily   atorvastatin  80 mg Oral QHS   enoxaparin (LOVENOX) injection  40 mg Subcutaneous Q24H   guaiFENesin  1,200 mg Oral Daily   insulin aspart  0-9 Units Subcutaneous TID WC   levothyroxine  75 mcg Oral Q0600   melatonin  5 mg Oral QHS   mometasone-formoterol  2 puff Inhalation BID   Continuous Infusions:  cefTRIAXone (ROCEPHIN)  IV 2 g (03/27/23 1742)   potassium PHOSPHATE IVPB (in mmol) 15 mmol (03/28/23 0848)   PRN Meds:.acetaminophen **OR** acetaminophen, albuterol, ALPRAZolam, ondansetron **OR** ondansetron (ZOFRAN) IV  Current Outpatient Medications  Medication Instructions   albuterol (PROAIR HFA) 108 (90 Base) MCG/ACT inhaler 2 puffs, Inhalation, Every 6 hours PRN   alendronate (FOSAMAX) 70 mg, Oral, Every 7 days, Take with a full glass of water on an empty stomach.   ALPRAZolam (XANAX) 0.25 MG tablet TAKE 1 TABLET BY MOUTH AT BEDTIME AS NEEDED FOR SLEEP. TAKE SPARINGLY FOR DAYTIME ANXIETY, 8 HOURS BETWEEN DOSES. DO NOT DRIVE FOR 8 HOURS   amitriptyline (ELAVIL) 50 mg, Oral, Daily at bedtime   amLODipine (NORVASC) 5 mg, Oral, Daily   aspirin EC 81 mg, Oral, Daily   atorvastatin (  LIPITOR) 80 MG tablet TAKE 1 TABLET(80 MG) BY MOUTH DAILY   calcium carbonate (OS-CAL - DOSED IN MG OF ELEMENTAL CALCIUM) 1250 (500 Ca) MG tablet 1 tablet, Oral, Daily   cholecalciferol (VITAMIN D3) 1,000 Units, Oral, Daily   CoQ10 200 mg, Oral, Daily   cyanocobalamin (VITAMIN B12) 1,000 mcg, Oral, Daily   melatonin 5 mg, Oral, Daily at bedtime    metFORMIN (GLUCOPHAGE) 500 MG tablet TAKE 1 TABLET(500 MG) BY MOUTH TWICE DAILY WITH A MEAL   mometasone-formoterol (DULERA) 200-5 MCG/ACT AERO 2 puffs, Inhalation, 2 times daily   Multiple Vitamin (MULTIVITAMIN) tablet 1 tablet, Oral, Daily   nebivolol (BYSTOLIC) 2.5 MG tablet TAKE 1 TABLET(2.5 MG) BY MOUTH DAILY   Respiratory Therapy Supplies (FLUTTER) DEVI Use as directed   SYNTHROID 75 MCG tablet TAKE 1 TABLET(75 MCG) BY MOUTH DAILY BEFORE AND BREAKFAST   telmisartan (MICARDIS) 80 MG tablet TAKE 1 TABLET(80 MG) BY MOUTH DAILY    Diet Orders (From admission, onward)     Start     Ordered   03/26/23 2125  Diet heart healthy/carb modified Room service appropriate? Yes; Fluid consistency: Thin  Diet effective now       Question Answer Comment  Diet-HS Snack? Nothing   Room service appropriate? Yes   Fluid consistency: Thin      03/26/23 2124            DVT prophylaxis: enoxaparin (LOVENOX) injection 40 mg Start: 03/27/23 1000   Lab Results  Component Value Date   PLT 280 03/28/2023      Code Status: Full Code  Family Communication: No family at bedside  Status is: Inpatient   Level of care: Med-Surg  Consultants:  None  Objective: Vitals:   03/28/23 0457 03/28/23 0535 03/28/23 0841 03/28/23 0936  BP:  105/61 (!) 105/51   Pulse:  87 69   Resp:  18 16   Temp:  99.2 F (37.3 C) 98.5 F (36.9 C)   TempSrc:   Oral   SpO2: 95% 99% 100% 100%  Weight:      Height:        Intake/Output Summary (Last 24 hours) at 03/28/2023 1146 Last data filed at 03/28/2023 0600 Gross per 24 hour  Intake 1844.78 ml  Output 900 ml  Net 944.78 ml   Wt Readings from Last 3 Encounters:  03/26/23 52.4 kg  03/10/23 51.6 kg  01/11/23 50.6 kg    Examination:  Constitutional: NAD Eyes: lids and conjunctivae normal, no scleral icterus ENMT: mmm Neck: normal, supple Respiratory: clear to auscultation bilaterally, no wheezing, no crackles. Normal respiratory effort.   Cardiovascular: Regular rate and rhythm, no murmurs / rubs / gallops. No LE edema. Abdomen: soft, no distention, no tenderness. Bowel sounds positive.   Data Reviewed: I have independently reviewed following labs and imaging studies   CBC Recent Labs  Lab 03/26/23 1557 03/27/23 0119 03/28/23 0338  WBC 14.6* 15.0* 12.3*  HGB 11.9* 11.2* 10.0*  HCT 36.1 34.5* 31.9*  PLT 392 318 280  MCV 93.3 96.4 99.7  MCH 30.7 31.3 31.3  MCHC 33.0 32.5 31.3  RDW 12.1 12.3 12.8  LYMPHSABS 0.3*  --   --   MONOABS 0.5  --   --   EOSABS 0.0  --   --   BASOSABS 0.0  --   --     Recent Labs  Lab 03/26/23 1557 03/26/23 2233 03/27/23 0119 03/28/23 0338  NA 132*  --  132* 136  K  3.8  --  4.0 3.8  CL 98  --  97* 104  CO2 23  --  25 24  GLUCOSE 191*  --  159* 100*  BUN 15  --  12 9  CREATININE 0.71  --  0.73 0.72  CALCIUM 8.8*  --  8.2* 8.0*  AST 18  --  20 19  ALT 11  --  14 13  ALKPHOS 73  --  68 67  BILITOT 0.5  --  0.3 0.4  ALBUMIN 4.2  --  3.0* 2.6*  MG  --   --   --  2.4  LATICACIDVEN  --  0.9 1.1  --     ------------------------------------------------------------------------------------------------------------------ No results for input(s): "CHOL", "HDL", "LDLCALC", "TRIG", "CHOLHDL", "LDLDIRECT" in the last 72 hours.  Lab Results  Component Value Date   HGBA1C 6.2 01/06/2023   ------------------------------------------------------------------------------------------------------------------ No results for input(s): "TSH", "T4TOTAL", "T3FREE", "THYROIDAB" in the last 72 hours.  Invalid input(s): "FREET3"  Cardiac Enzymes No results for input(s): "CKMB", "TROPONINI", "MYOGLOBIN" in the last 168 hours.  Invalid input(s): "CK" ------------------------------------------------------------------------------------------------------------------    Component Value Date/Time   BNP 226.9 (H) 04/17/2015 1810    CBG: Recent Labs  Lab 03/27/23 1643 03/27/23 2137 03/27/23 2225  03/28/23 0811 03/28/23 1133  GLUCAP 135* 156* 123* 84 126*    Recent Results (from the past 240 hours)  Urine Culture     Status: None   Collection Time: 03/26/23  3:57 PM   Specimen: Urine, Clean Catch  Result Value Ref Range Status   Specimen Description   Final    URINE, CLEAN CATCH Performed at [facility], [address] [place], [place] [address]    Special Requests   Final    NONE Performed at [facility], [address], [place], [place] [address]    Culture   Final    NO GROWTH Performed at [facility], [address], [place], [place] [address]    Report Status 03/27/2023 FINAL  Final  Blood culture (routine x 2)     Status: None (Preliminary result)   Collection Time: 03/26/23  4:09 PM   Specimen: BLOOD LEFT ARM  Result Value Ref Range Status   Specimen Description   Final    BLOOD LEFT ARM Performed at [facility], [address], [place], [place] [address]    Special Requests   Final    BOTTLES DRAWN AEROBIC AND ANAEROBIC Blood Culture adequate volume Performed at [facility], [address], [place], [place] [address]    Culture   Final    NO GROWTH 2 DAYS Performed at [facility], [address], [place], [place] [address]    Report Status PENDING  Incomplete  Blood culture (routine x 2)     Status: None (Preliminary result)   Collection Time: 03/26/23  6:41 PM   Specimen: BLOOD RIGHT ARM  Result Value Ref Range Status   Specimen Description   Final    BLOOD RIGHT ARM Performed at [facility], [address] [place], [place] [address]    Special Requests   Final    BOTTLES DRAWN AEROBIC AND ANAEROBIC Blood Culture adequate volume Performed at [facility], [address], [place], [place] [address]    Culture   Final    NO GROWTH 2 DAYS Performed at [facility], [address] [place], [place] [address]    Report Status  PENDING  Incomplete  Resp panel by RT-PCR (RSV, Flu A&B, Covid) Anterior Nasal Swab     Status: None   Collection Time: 03/26/23  7:59 PM   Specimen: Anterior Nasal Swab  Result Value Ref Range Status   SARS Coronavirus 2 by RT PCR NEGATIVE NEGATIVE Final    Comment: (NOTE) SARS-CoV-2 target nucleic acids are NOT DETECTED.  The SARS-CoV-2 RNA is generally detectable in upper respiratory specimens during the acute phase of infection. The lowest concentration of SARS-CoV-2 viral copies this assay can detect is 138 copies/mL. A negative result does not preclude SARS-Cov-2 infection and should not be used as the sole basis for treatment or other patient management decisions. A negative result may occur with  improper specimen collection/handling, submission of specimen other than nasopharyngeal swab, presence of viral mutation(s) within the areas targeted by this assay, and inadequate number of viral copies(<138 copies/mL). A negative result must be combined with clinical observations, patient history, and epidemiological information. The expected result is Negative.  Fact Sheet for Patients:  BloggerCourse.com  Fact Sheet for Healthcare Providers:  SeriousBroker.it  This test is no t yet approved or cleared by the [place] FDA and  has been authorized for detection and/or diagnosis of SARS-CoV-2 by FDA under an Emergency Use Authorization (EUA). This EUA will remain  in effect (meaning this test can be used) for the duration of the COVID-19 declaration under Section 564(b)(1) of the Act, 21 U.S.C.section 360bbb-3(b)(1), unless the authorization is terminated  or revoked sooner.       Influenza A by PCR NEGATIVE NEGATIVE Final   Influenza B by PCR NEGATIVE NEGATIVE Final    Comment: (NOTE) The Xpert Xpress SARS-CoV-2/FLU/RSV plus assay is intended as an aid in the diagnosis of influenza from Nasopharyngeal swab specimens  and should not be used as a sole basis for treatment. Nasal washings and aspirates are unacceptable for Xpert Xpress SARS-CoV-2/FLU/RSV testing.  Fact Sheet for Patients: BloggerCourse.com  Fact Sheet for Healthcare Providers: SeriousBroker.it  This test is not yet approved or cleared by the [place] FDA and has been authorized for detection and/or diagnosis of SARS-CoV-2 by FDA under an Emergency Use Authorization (EUA). This EUA will remain in effect (meaning this test can be used) for the duration of the COVID-19 declaration under Section 564(b)(1) of the Act, 21 U.S.C. section 360bbb-3(b)(1), unless the authorization is terminated or revoked.     Resp Syncytial Virus by PCR NEGATIVE NEGATIVE Final    Comment: (NOTE) Fact Sheet for Patients: BloggerCourse.com  Fact Sheet for Healthcare Providers: SeriousBroker.it  This test is not yet approved or cleared by the [place] FDA and has been authorized for detection and/or diagnosis of SARS-CoV-2 by FDA under an Emergency Use Authorization (EUA). This EUA will remain in effect (meaning this test can be used) for the duration of the COVID-19 declaration under Section 564(b)(1) of the Act, 21 U.S.C. section 360bbb-3(b)(1), unless the authorization is terminated or revoked.  Performed at [facility], [address] [place], [place] [address]   Expectorated Sputum Assessment w Gram Stain, Rflx to Resp Cult     Status: None   Collection Time: 03/27/23  5:32 PM   Specimen: Expectorated Sputum  Result Value Ref Range Status   Specimen Description EXPECTORATED SPUTUM  Final   Special Requests NONE  Final   Sputum evaluation   Final    THIS SPECIMEN IS ACCEPTABLE FOR SPUTUM CULTURE Performed at [facility], [address]  [place], [place] [address]    Report Status 03/27/2023  FINAL  Final  Culture, Respiratory w Gram Stain     Status: None (Preliminary result)   Collection Time: 03/27/23  5:32 PM  Result Value Ref Range Status   Specimen Description   Final    EXPECTORATED SPUTUM Performed at [facility], [address], [place], [place] [address]    Special Requests   Final    NONE Reflexed from [id number] Performed at [facility], [address] [place], [place] [address]    Gram Stain   Final    MODERATE WBC PRESENT,BOTH PMN AND MONONUCLEAR FEW YEAST FEW GRAM POSITIVE COCCI IN CLUSTERS Performed at [facility], [address] [place], [place] [address]    Culture PENDING  Incomplete   Report Status PENDING  Incomplete     Radiology Studies: No results found.    Pamella Pert, MD, PhD [facility]  Between 7 am - 7 pm I am available, please contact me via Amion (for emergencies) or Securechat (non urgent messages)  Between 7 pm - 7 am I am not available, please contact night coverage MD/APP via Amion

## 2023-03-28 NOTE — Progress Notes (Signed)
SATURATION QUALIFICATIONS: (This note is used to comply with regulatory documentation for home oxygen)  Patient Saturations on Room Air at Rest = 100%  Patient Saturations on Room Air while Ambulating = 97%   Please briefly explain why patient needs home oxygen: 

## 2023-03-28 NOTE — Evaluation (Signed)
 Physical Therapy Evaluation Patient Details Name: Belinda Day MRN: [id number] DOB: 19-Dec-1945 Today's Date: 03/28/2023  History of Present Illness  78 yo female presents to therapy following hospital admission on 03/26/2023 secondary to sepsis attributed to pyelonephritis and recent E. Coli UTI. Pt PMH includes but is not limited to: bronchiectasis, COPD, CAD, DM II, HTN, HLD, COPD, ankle injury with required wound care s/p fall, hypothyroidism, and CAD s/p CABG.  Clinical Impression    Pt admitted with above diagnosis.  Pt currently with functional limitations due to the deficits listed below (see PT Problem List). Pt seated in recliner when PT arrived. Pt agreeable to therapy intervention. Pt repots no pain with tylenol administered recently, no current SOB or dizziness however some SOB in am. Pt required S for transfers and gait tasks 250 feet no AD, no overt LOB or reported fatigue. Pt left seated in recliner and all needs in place. Pt will benefit from acute skilled PT to increase their independence and safety with mobility to allow discharge.         If plan is discharge home, recommend the following: Assist for transportation   Can travel by private vehicle        Equipment Recommendations None recommended by PT  Recommendations for Other Services       Functional Status Assessment Patient has had a recent decline in their functional status and demonstrates the ability to make significant improvements in function in a reasonable and predictable amount of time.     Precautions / Restrictions Precautions Precautions: Fall Restrictions Weight Bearing Restrictions Per Provider Order: No      Mobility  Bed Mobility Overal bed mobility: Independent, Modified Independent             General bed mobility comments: pt seated in recliner when PT arrived    Transfers Overall transfer level: Needs assistance Equipment used: None Transfers: Sit to/from Stand Sit to Stand:  Supervision           General transfer comment: min cues    Ambulation/Gait Ambulation/Gait assistance: Supervision Gait Distance (Feet): 250 Feet Assistive device: None Gait Pattern/deviations: Step-through pattern Gait velocity: WFL     General Gait Details: no overt gait abnormalities  Stairs            Wheelchair Mobility     Tilt Bed    Modified Rankin (Stroke Patients Only)       Balance Overall balance assessment: History of Falls, No apparent balance deficits (not formally assessed)                                           Pertinent Vitals/Pain Pain Assessment Pain Assessment: No/denies pain    Home Living Family/patient expects to be discharged to:: Private residence Living Arrangements: Alone Available Help at Discharge: Family;Friend(s) Type of Home: House (townhome) Home Access: Level entry       Home Layout: One level Home Equipment: Cane - single point;Crutches      Prior Function Prior Level of Function : Independent/Modified Independent;Driving             Mobility Comments: IND no AD for all ADLs, self care tasks and IADLs       Extremity/Trunk Assessment        Lower Extremity Assessment Lower Extremity Assessment: Overall WFL for tasks assessed    Cervical / Trunk Assessment Cervical /  Trunk Assessment: Normal  Communication   Communication Communication: No apparent difficulties    Cognition Arousal: Alert Behavior During Therapy: WFL for tasks assessed/performed   PT - Cognitive impairments: No apparent impairments                         Following commands: Intact       Cueing       General Comments      Exercises     Assessment/Plan    PT Assessment Patient needs continued PT services  PT Problem List Decreased activity tolerance;Decreased mobility       PT Treatment Interventions Gait training;Functional mobility training;Therapeutic activities;Therapeutic  exercise;Neuromuscular re-education;Patient/family education    PT Goals (Current goals can be found in the Care Plan section)  Acute Rehab PT Goals Patient Stated Goal: to go home PT Goal Formulation: With patient Time For Goal Achievement: 04/11/23 Potential to Achieve Goals: Good    Frequency Min 1X/week     Co-evaluation               AM-PAC PT "6 Clicks" Mobility  Outcome Measure Help needed turning from your back to your side while in a flat bed without using bedrails?: None Help needed moving from lying on your back to sitting on the side of a flat bed without using bedrails?: None Help needed moving to and from a bed to a chair (including a wheelchair)?: None Help needed standing up from a chair using your arms (e.g., wheelchair or bedside chair)?: A Little Help needed to walk in hospital room?: A Little Help needed climbing 3-5 steps with a railing? : A Little 6 Click Score: 21    End of Session Equipment Utilized During Treatment: Gait belt Activity Tolerance: Patient tolerated treatment well Patient left: in chair;with call bell/phone within reach Nurse Communication: Mobility status PT Visit Diagnosis: History of falling (Z91.81);Difficulty in walking, not elsewhere classified (R26.2)    Time: 1731-1740 PT Time Calculation (min) (ACUTE ONLY): 9 min   Charges:   PT Evaluation $PT Eval Low Complexity: 1 Low   PT General Charges $$ ACUTE PT VISIT: 1 Visit         Johnny Bridge, PT Acute Rehab   Jacqualyn Posey 03/28/2023, 5:46 PM

## 2023-03-29 ENCOUNTER — Other Ambulatory Visit (HOSPITAL_COMMUNITY): Payer: Self-pay

## 2023-03-29 DIAGNOSIS — N12 Tubulo-interstitial nephritis, not specified as acute or chronic: Secondary | ICD-10-CM | POA: Diagnosis not present

## 2023-03-29 LAB — CBC
HCT: 34.3 % — ABNORMAL LOW (ref 36.0–46.0)
Hemoglobin: 10.9 g/dL — ABNORMAL LOW (ref 12.0–15.0)
MCH: 30.4 pg (ref 26.0–34.0)
MCHC: 31.8 g/dL (ref 30.0–36.0)
MCV: 95.8 fL (ref 80.0–100.0)
Platelets: 310 10*3/uL (ref 150–400)
RBC: 3.58 MIL/uL — ABNORMAL LOW (ref 3.87–5.11)
RDW: 12.5 % (ref 11.5–15.5)
WBC: 9.5 10*3/uL (ref 4.0–10.5)
nRBC: 0 % (ref 0.0–0.2)

## 2023-03-29 LAB — GLUCOSE, CAPILLARY
Glucose-Capillary: 91 mg/dL (ref 70–99)
Glucose-Capillary: 93 mg/dL (ref 70–99)

## 2023-03-29 LAB — BASIC METABOLIC PANEL
Anion gap: 10 (ref 5–15)
BUN: 10 mg/dL (ref 8–23)
CO2: 23 mmol/L (ref 22–32)
Calcium: 8.1 mg/dL — ABNORMAL LOW (ref 8.9–10.3)
Chloride: 103 mmol/L (ref 98–111)
Creatinine, Ser: 0.71 mg/dL (ref 0.44–1.00)
GFR, Estimated: >60 mL/min (ref >60)
Glucose, Bld: 96 mg/dL (ref 70–99)
Potassium: 3.8 mmol/L (ref 3.5–5.1)
Sodium: 136 mmol/L (ref 135–145)

## 2023-03-29 MED ORDER — CEFADROXIL 500 MG PO CAPS
1000.0000 mg | ORAL_CAPSULE | Freq: Two times a day (BID) | ORAL | 0 refills | Status: AC
  Filled 2023-03-29: qty 28, 7d supply, fill #0

## 2023-03-29 MED ORDER — MAGNESIUM HYDROXIDE 400 MG/5ML PO SUSP: 30.0000 mL | Freq: Every day | ORAL | Status: DC | PRN

## 2023-03-29 MED ORDER — BENZONATATE 100 MG PO CAPS
100.0000 mg | ORAL_CAPSULE | Freq: Once | ORAL | Status: AC
  Administered 2023-03-29: 100 mg via ORAL
  Filled 2023-03-29: qty 1

## 2023-03-29 NOTE — Progress Notes (Signed)
 Physical Therapy Treatment Patient Details Name: Belinda Day MRN: [id number] DOB: January 11, 1946 Today's Date: 03/29/2023   History of Present Illness 78 yo female presents to therapy following hospital admission on 03/26/2023 secondary to sepsis attributed to pyelonephritis and recent E. Coli UTI. Pt PMH includes but is not limited to: bronchiectasis, COPD, CAD, DM II, HTN, HLD, COPD, ankle injury with required wound care s/p fall, hypothyroidism, and CAD s/p CABG.    PT Comments   Pt admitted with above diagnosis.  Pt currently with functional limitations due to the deficits listed below (see PT Problem List). Pt seated in recliner when PT arrived. Pt underwent overnight pulse ox study and pt stated she was a little SOB when getting OOB at night. Pt did not report SOB with therapy session amb 500 feet no AD with distant S. Pt is mod I for transfer tasks. Pt indicated that she is ready to d/c home and will transition with PO abx. Pt left seated in recliner, all needs in place.  Pt will benefit from acute skilled PT to increase their independence and safety with mobility to allow discharge.      If plan is discharge home, recommend the following: Assist for transportation   Can travel by private vehicle        Equipment Recommendations  None recommended by PT    Recommendations for Other Services       Precautions / Restrictions Precautions Precautions: Fall Restrictions Weight Bearing Restrictions Per Provider Order: No     Mobility  Bed Mobility Overal bed mobility: Independent, Modified Independent             General bed mobility comments: pt seated in recliner when PT arrived    Transfers Overall transfer level: Modified independent Equipment used: None Transfers: Sit to/from Stand Sit to Stand: Modified independent (Device/Increase time)                Ambulation/Gait Ambulation/Gait assistance: Supervision Gait Distance (Feet): 500 Feet Assistive device:  None Gait Pattern/deviations: Step-through pattern Gait velocity: WFL     General Gait Details: no overt gait abnormalities   Stairs             Wheelchair Mobility     Tilt Bed    Modified Rankin (Stroke Patients Only)       Balance Overall balance assessment: History of Falls, No apparent balance deficits (not formally assessed)                                          Communication Communication Communication: No apparent difficulties  Cognition Arousal: Alert Behavior During Therapy: WFL for tasks assessed/performed   PT - Cognitive impairments: No apparent impairments                         Following commands: Intact      Cueing    Exercises      General Comments        Pertinent Vitals/Pain      Home Living                          Prior Function            PT Goals (current goals can now be found in the care plan section) Acute Rehab PT Goals Patient Stated Goal: to  go home PT Goal Formulation: With patient Time For Goal Achievement: 04/11/23 Potential to Achieve Goals: Good Progress towards PT goals: Progressing toward goals    Frequency    Min 1X/week      PT Plan      Co-evaluation              AM-PAC PT "6 Clicks" Mobility   Outcome Measure  Help needed turning from your back to your side while in a flat bed without using bedrails?: None Help needed moving from lying on your back to sitting on the side of a flat bed without using bedrails?: None Help needed moving to and from a bed to a chair (including a wheelchair)?: None Help needed standing up from a chair using your arms (e.g., wheelchair or bedside chair)?: None Help needed to walk in hospital room?: None Help needed climbing 3-5 steps with a railing? : A Little 6 Click Score: 23    End of Session Equipment Utilized During Treatment: Gait belt Activity Tolerance: Patient tolerated treatment well Patient left: in  chair;with call bell/phone within reach Nurse Communication: Mobility status PT Visit Diagnosis: History of falling (Z91.81);Difficulty in walking, not elsewhere classified (R26.2)     Time: 1610-9604 PT Time Calculation (min) (ACUTE ONLY): 11 min  Charges:    $Gait Training: 8-22 mins PT General Charges $$ ACUTE PT VISIT: 1 Visit                     Johnny Bridge, PT Acute Rehab    Jacqualyn Posey 03/29/2023, 11:42 AM

## 2023-03-29 NOTE — Plan of Care (Addendum)
 Patient is alert and oriented X4, all discharge information went over with patient, AVS printed, no further needs or questions at this time. PIV removed.  Problem: Education: Goal: Knowledge of General Education information will improve Description: Including pain rating scale, medication(s)/side effects and non-pharmacologic comfort measures Outcome: Completed/Met   Problem: Health Behavior/Discharge Planning: Goal: Ability to manage health-related needs will improve Outcome: Completed/Met   Problem: Clinical Measurements: Goal: Ability to maintain clinical measurements within normal limits will improve Outcome: Completed/Met Goal: Will remain free from infection Outcome: Completed/Met Goal: Diagnostic test results will improve Outcome: Completed/Met Goal: Respiratory complications will improve Outcome: Completed/Met Goal: Cardiovascular complication will be avoided Outcome: Completed/Met   Problem: Activity: Goal: Risk for activity intolerance will decrease Outcome: Completed/Met   Problem: Nutrition: Goal: Adequate nutrition will be maintained Outcome: Completed/Met   Problem: Coping: Goal: Level of anxiety will decrease Outcome: Completed/Met   Problem: Elimination: Goal: Will not experience complications related to bowel motility Outcome: Completed/Met Goal: Will not experience complications related to urinary retention Outcome: Completed/Met   Problem: Pain Managment: Goal: General experience of comfort will improve and/or be controlled Outcome: Completed/Met   Problem: Safety: Goal: Ability to remain free from injury will improve Outcome: Completed/Met   Problem: Skin Integrity: Goal: Risk for impaired skin integrity will decrease Outcome: Completed/Met   Problem: Education: Goal: Ability to describe self-care measures that may prevent or decrease complications (Diabetes Survival Skills Education) will improve Outcome: Completed/Met Goal: Individualized  Educational Video(s) Outcome: Completed/Met   Problem: Coping: Goal: Ability to adjust to condition or change in health will improve Outcome: Completed/Met   Problem: Fluid Volume: Goal: Ability to maintain a balanced intake and output will improve Outcome: Completed/Met   Problem: Health Behavior/Discharge Planning: Goal: Ability to identify and utilize available resources and services will improve Outcome: Completed/Met Goal: Ability to manage health-related needs will improve Outcome: Completed/Met   Problem: Metabolic: Goal: Ability to maintain appropriate glucose levels will improve Outcome: Completed/Met   Problem: Nutritional: Goal: Maintenance of adequate nutrition will improve Outcome: Completed/Met Goal: Progress toward achieving an optimal weight will improve Outcome: Completed/Met   Problem: Skin Integrity: Goal: Risk for impaired skin integrity will decrease Outcome: Completed/Met   Problem: Tissue Perfusion: Goal: Adequacy of tissue perfusion will improve Outcome: Completed/Met

## 2023-03-29 NOTE — Discharge Summary (Signed)
 Physician Discharge Summary  Belinda Day ZOX:[id number] DOB: 05/08/1945 DOA: 03/26/2023  PCP: Shelva Majestic, MD  Admit date: 03/26/2023 Discharge date: 03/29/2023  Admitted From: home Disposition:  home  Recommendations for Outpatient Follow-up:  Follow up with PCP in 1-2 weeks  Home Health: none Equipment/Devices: none  Discharge Condition: stable CODE STATUS: Full code Diet Orders (From admission, onward)     Start     Ordered   03/26/23 2125  Diet heart healthy/carb modified Room service appropriate? Yes; Fluid consistency: Thin  Diet effective now       Question Answer Comment  Diet-HS Snack? Nothing   Room service appropriate? Yes   Fluid consistency: Thin      03/26/23 2124            HPI: Per admitting MD, Belinda Day is a 78 y.o. female with medical history significant of bronchiectasis,  COPD, CAD, T2DM, GERD, HTN, HLD, hypothyroidism who was treated for UTI (e.coli by cx) on 2/13 with macrobid x 5 days then with recurrent dysuria given course of keflex on 2/25 who presented to ED with complaints of rigors and left flank/abdominal pain. She woke up this morning with chill/rigors and a lot of pain on her left lower abdomen/flank. A friend took her to ED. She didn't check her temperature at home.  She has associated nausea and vomiting that also started this morning. She states she has had dysuria, not really urgency or frequency but she hasn't really been able to keep anything down. No blood in her urine or foul smell. Maybe cloudier appearing. She hasn't been able to keep much down today. She has continued to have vomiting even on arrival to [place]. She has not had a UTI for 20-25 years ago until this February.    Hospital Course / Discharge diagnoses: Principal Problem:   sepsis secondary to Pyelonephritis Active Problems:   Hyponatremia   Nocturnal hypoxemia   Obstructive bronchiectasis (HCC) with GOLD II/III criteria   Essential hypertension   CAD  (coronary artery disease) s/p CABG   Type 2 diabetes mellitus without complication, without long-term current use of insulin (HCC)   Hypothyroidism   Insomnia   Hyperlipemia   Sepsis (HCC)   Principal problem Sepsis due to pyelonephritis - patient was admitted to the hospital with sepsis physiology in the setting pf pyelonephritis. She was initially placed on ceftriaxone and cultures were sent. Blood and urine cultures have remained negative, likely in the setting of recent use of antibiotics. With initial treatment, her fever, nausea/vomiting all resolved. She is now feeling much better, back to baseline, able to ambulate and have a regular diet. Based on prior outpatient microbiology, she will be placed on higher dose antibiotics with Cefadroxil 1 g BID, and she is to complete 7 additional days for a total of 10 days. She will be discharged home in stable condition. Recommend outpatient follow up with PCP in 1-2 weeks  Active problems Bronchiectasis, COPD-follows with pulmonary as an outpatient.  Was on oxygen in the past about a decade ago but came off of it.  She complains of intermittent shortness of breath at home, and requesting oxygen. She underwent ambulatory sats as well as overnight pulse oxymetry, and she does not qualify for home O2. She is maintaining sats ~ 90s % on room air while ambulating, and overnight study showed only 1 min of desaturation (> 90% sat) in total Essential hypertension- resume home medications Hypothyroidism-continue Synthroid Hyperlipidemia-continue statin CAD, history of  CABG-no chest pain, this is stable, continue aspirin DM2 - A1c 6.2. Continue home medications   Discharge Instructions   Allergies as of 03/29/2023       Reactions   Bactrim [sulfamethoxazole-trimethoprim] Hives, Itching, Other (See Comments)   Bruised like areas on body   Ciprofloxacin Other (See Comments)   Body aches Other Reaction(s): Not available, Not available, Not available,  Not available, Not available   Codeine Nausea Only   REACTION: nausea   Erythromycin Nausea Only   REACTION: nausea   Levaquin [levofloxacin] Other (See Comments)   REACTION: aches   Tramadol Nausea And Vomiting        Medication List     TAKE these medications    albuterol 108 (90 Base) MCG/ACT inhaler Commonly known as: ProAir HFA Inhale 2 puffs into the lungs every 6 (six) hours as needed for wheezing or shortness of breath.   alendronate 70 MG tablet Commonly known as: FOSAMAX Take 1 tablet (70 mg total) by mouth every 7 (seven) days. Take with a full glass of water on an empty stomach.   ALPRAZolam 0.25 MG tablet Commonly known as: XANAX TAKE 1 TABLET BY MOUTH AT BEDTIME AS NEEDED FOR SLEEP. TAKE SPARINGLY FOR DAYTIME ANXIETY, 8 HOURS BETWEEN DOSES. DO NOT DRIVE FOR 8 HOURS What changed:  how much to take how to take this when to take this reasons to take this   amitriptyline 50 MG tablet Commonly known as: ELAVIL Take 1 tablet (50 mg total) by mouth at bedtime.   amLODipine 5 MG tablet Commonly known as: NORVASC Take 1 tablet (5 mg total) by mouth daily.   aspirin EC 81 MG tablet Take 81 mg by mouth daily.   atorvastatin 80 MG tablet Commonly known as: LIPITOR TAKE 1 TABLET(80 MG) BY MOUTH DAILY   calcium carbonate 1250 (500 Ca) MG tablet Commonly known as: OS-CAL - dosed in mg of elemental calcium Take 1 tablet by mouth daily.   cefadroxil 500 MG capsule Commonly known as: DURICEF Take 2 capsules (1,000 mg total) by mouth 2 (two) times daily for 7 days.   cholecalciferol 25 MCG (1000 UNIT) tablet Commonly known as: VITAMIN D3 Take 1,000 Units by mouth daily.   CoQ10 200 MG Caps Take 200 mg by mouth daily.   cyanocobalamin 1000 MCG tablet Commonly known as: VITAMIN B12 Take 1,000 mcg by mouth daily.   Dulera 200-5 MCG/ACT Aero Generic drug: mometasone-formoterol Inhale 2 puffs into the lungs in the morning and at bedtime.   Flutter  Devi Use as directed   melatonin 5 MG Tabs Take 5 mg by mouth at bedtime.   metFORMIN 500 MG tablet Commonly known as: GLUCOPHAGE TAKE 1 TABLET(500 MG) BY MOUTH TWICE DAILY WITH A MEAL What changed:  how much to take how to take this when to take this   multivitamin tablet Take 1 tablet by mouth daily.   nebivolol 2.5 MG tablet Commonly known as: BYSTOLIC TAKE 1 TABLET(2.5 MG) BY MOUTH DAILY What changed: See the new instructions.   Synthroid 75 MCG tablet Generic drug: levothyroxine TAKE 1 TABLET(75 MCG) BY MOUTH DAILY BEFORE AND BREAKFAST   telmisartan 80 MG tablet Commonly known as: MICARDIS TAKE 1 TABLET(80 MG) BY MOUTH DAILY What changed: See the new instructions.       Consultations: none  Procedures/Studies:  DG CHEST PORT 1 VIEW Result Date: 03/26/2023 CLINICAL DATA:  Hypoxia EXAM: PORTABLE CHEST 1 VIEW COMPARISON:  09/08/2022 FINDINGS: Cardiac shadow is stable. Postsurgical  changes are again seen. Scarring is noted in the lungs bilaterally. No focal infiltrate or effusion is seen. No bony abnormality is noted. IMPRESSION: Chronic changes without acute abnormality. Electronically Signed   By: Alcide Clever M.D.   On: 03/26/2023 23:11   CT ABDOMEN PELVIS W CONTRAST Result Date: 03/26/2023 CLINICAL DATA:  Left flank pain EXAM: CT ABDOMEN AND PELVIS WITH CONTRAST TECHNIQUE: Multidetector CT imaging of the abdomen and pelvis was performed using the standard protocol following bolus administration of intravenous contrast. RADIATION DOSE REDUCTION: This exam was performed according to the departmental dose-optimization program which includes automated exposure control, adjustment of the mA and/or kV according to patient size and/or use of iterative reconstruction technique. CONTRAST:  OMNIPAQUE IOHEXOL 300 MG/ML  SOLN COMPARISON:  CT 04/04/2020 FINDINGS: Lower chest: Bronchiectasis and nodular scarring at the right base. Mild tree-in-bud density at the left lung base  Hepatobiliary: No calcified gallstone. No biliary dilatation. Subcentimeter hypodensities in the posterior right hepatic lobe too small to further characterize. Pancreas: Unremarkable. No pancreatic ductal dilatation or surrounding inflammatory changes. Spleen: Normal in size without focal abnormality. Adrenals/Urinary Tract: Adrenal glands are normal. Areas of cortical scarring in the kidneys. Patchy cortical hypoenhancement within the left greater than right kidney. No discrete solid mass. Cysts and subcentimeter hypodensities for which no imaging follow-up is recommended. Mucosal enhancement of the bladder with slight wall thickening and perivesical haziness. Stomach/Bowel: Stomach nonenlarged. Diverticular disease of the colon without acute inflammation. Negative appendix Vascular/Lymphatic: Aortic atherosclerosis. No enlarged abdominal or pelvic lymph nodes. Reproductive: Atrophic uterus.  No adnexal mass Other: Negative for pelvic effusion or free air Musculoskeletal: No acute or suspicious osseous abnormality IMPRESSION: 1. Patchy cortical hypoenhancement involving the left greater than right kidneys within appearance suspicious for pyelonephritis. Slightly thick-walled urinary bladder with mild perivesical stranding and mucosal enhancement as may be seen with cystitis 2. Minimal tree-in-bud density at left lung base suggesting atypical lung infection 3. Diverticular disease of the colon without acute inflammatory process Electronically Signed   By: Jasmine Pang M.D.   On: 03/26/2023 18:13     Subjective: - no chest pain, shortness of breath, no abdominal pain, nausea or vomiting.   Discharge Exam: BP 133/63 (BP Location: Right Arm)   Pulse 89   Temp 99.7 F (37.6 C) (Oral)   Resp 18   Ht 5\' 1"  (1.549 m)   Wt 52.4 kg   SpO2 98%   BMI 21.83 kg/m   General: Pt is alert, awake, not in acute distress Cardiovascular: RRR, S1/S2 +, no rubs, no gallops Respiratory: CTA bilaterally, no wheezing,  no rhonchi Abdominal: Soft, NT, ND, bowel sounds + Extremities: no edema, no cyanosis    The results of significant diagnostics from this hospitalization (including imaging, microbiology, ancillary and laboratory) are listed below for reference.     Microbiology: Recent Results (from the past 240 hours)  Urine Culture     Status: None   Collection Time: 03/26/23  3:57 PM   Specimen: Urine, Clean Catch  Result Value Ref Range Status   Specimen Description   Final    URINE, CLEAN CATCH Performed at [facility], [address] [place], [place] [address]    Special Requests   Final    NONE Performed at [facility], [address] [place], [place] [address]    Culture   Final    NO GROWTH Performed at [facility], [address], [place], [place] [address]  Report Status 03/27/2023 FINAL  Final  Blood culture (routine x 2)     Status: None (Preliminary result)   Collection Time: 03/26/23  4:09 PM   Specimen: BLOOD LEFT ARM  Result Value Ref Range Status   Specimen Description   Final    BLOOD LEFT ARM Performed at [facility], [address], [place], [place] [address]    Special Requests   Final    BOTTLES DRAWN AEROBIC AND ANAEROBIC Blood Culture adequate volume Performed at [facility], [address] [place], [place] [address]    Culture   Final    NO GROWTH 3 DAYS Performed at [facility], [address] [place], [place] [address]    Report Status PENDING  Incomplete  Blood culture (routine x 2)     Status: None (Preliminary result)   Collection Time: 03/26/23  6:41 PM   Specimen: BLOOD RIGHT ARM  Result Value Ref Range Status   Specimen Description   Final    BLOOD RIGHT ARM Performed at [facility], [address] [place], [place] [address]    Special Requests   Final    BOTTLES DRAWN AEROBIC AND ANAEROBIC Blood Culture adequate volume Performed at [facility], [address] [place], [place] [address]    Culture   Final    NO GROWTH 3 DAYS Performed at [facility], [address], [place], [place] [address]    Report Status PENDING  Incomplete  Resp panel by RT-PCR (RSV, Flu A&B, Covid) Anterior Nasal Swab     Status: None   Collection Time: 03/26/23  7:59 PM   Specimen: Anterior Nasal Swab  Result Value Ref Range Status   SARS Coronavirus 2 by RT PCR NEGATIVE NEGATIVE Final    Comment: (NOTE) SARS-CoV-2 target nucleic acids are NOT DETECTED.  The SARS-CoV-2 RNA is generally detectable in upper respiratory specimens during the acute phase of infection. The lowest concentration of SARS-CoV-2 viral copies this assay can detect is 138 copies/mL. A negative result does not preclude SARS-Cov-2 infection and should not be used as the sole basis for treatment or other patient management decisions. A negative result may occur with  improper specimen collection/handling, submission of specimen other than nasopharyngeal swab, presence of viral mutation(s) within the areas targeted by this assay, and inadequate number of viral copies(<138 copies/mL). A negative result must be combined with clinical observations, patient history, and epidemiological information. The expected result is Negative.  Fact Sheet for Patients:  BloggerCourse.com  Fact Sheet for Healthcare Providers:  SeriousBroker.it  This test is no t yet approved or cleared by the [place] FDA and  has been authorized for detection and/or diagnosis of SARS-CoV-2 by FDA under an Emergency Use Authorization (EUA). This EUA will remain  in effect (meaning this test can be used) for the duration of the COVID-19 declaration under Section 564(b)(1) of the Act, 21 U.S.C.section 360bbb-3(b)(1), unless the authorization is terminated  or revoked sooner.       Influenza A by PCR NEGATIVE NEGATIVE Final    Influenza B by PCR NEGATIVE NEGATIVE Final    Comment: (NOTE) The Xpert Xpress SARS-CoV-2/FLU/RSV plus assay is intended as an aid in the diagnosis of influenza from Nasopharyngeal swab specimens and should not be used as a sole basis for treatment. Nasal washings and aspirates are unacceptable for Xpert Xpress SARS-CoV-2/FLU/RSV testing.  Fact Sheet for Patients: BloggerCourse.com  Fact Sheet for Healthcare Providers:  SeriousBroker.it  This test is not yet approved or cleared by the [place] and has been authorized for detection and/or diagnosis of SARS-CoV-2 by FDA under an Emergency Use Authorization (EUA). This EUA will remain in effect (meaning this test can be used) for the duration of the COVID-19 declaration under Section 564(b)(1) of the Act, 21 U.S.C. section 360bbb-3(b)(1), unless the authorization is terminated or revoked.     Resp Syncytial Virus by PCR NEGATIVE NEGATIVE Final    Comment: (NOTE) Fact Sheet for Patients: BloggerCourse.com  Fact Sheet for Healthcare Providers: SeriousBroker.it  This test is not yet approved or cleared by the [place] FDA and has been authorized for detection and/or diagnosis of SARS-CoV-2 by FDA under an Emergency Use Authorization (EUA). This EUA will remain in effect (meaning this test can be used) for the duration of the COVID-19 declaration under Section 564(b)(1) of the Act, 21 U.S.C. section 360bbb-3(b)(1), unless the authorization is terminated or revoked.  Performed at [facility], [address], [place], [place] [address]   Expectorated Sputum Assessment w Gram Stain, Rflx to Resp Cult     Status: None   Collection Time: 03/27/23  5:32 PM   Specimen: Expectorated Sputum  Result Value Ref Range Status   Specimen Description EXPECTORATED SPUTUM  Final   Special Requests NONE  Final    Sputum evaluation   Final    THIS SPECIMEN IS ACCEPTABLE FOR SPUTUM CULTURE Performed at [facility], [address], [place], [place] [address]    Report Status 03/27/2023 FINAL  Final  Culture, Respiratory w Gram Stain     Status: None (Preliminary result)   Collection Time: 03/27/23  5:32 PM  Result Value Ref Range Status   Specimen Description   Final    EXPECTORATED SPUTUM Performed at [facility], [address], [place], [place] [address]    Special Requests   Final    NONE Reflexed from [id number] Performed at [facility], [address] [place], [place] [address]    Gram Stain   Final    MODERATE WBC PRESENT,BOTH PMN AND MONONUCLEAR FEW YEAST FEW GRAM POSITIVE COCCI IN CLUSTERS    Culture   Final    ABUNDANT GRAM NEGATIVE RODS CULTURE REINCUBATED FOR BETTER GROWTH Performed at [facility], [address] [place], [place] [address]    Report Status PENDING  Incomplete     Labs: Basic Metabolic Panel: Recent Labs  Lab 03/26/23 1557 03/27/23 0119 03/28/23 0338 03/29/23 0753  NA 132* 132* 136 136  K 3.8 4.0 3.8 3.8  CL 98 97* 104 103  CO2 23 25 24 23   GLUCOSE 191* 159* 100* 96  BUN 15 12 9 10   CREATININE 0.71 0.73 0.72 0.71  CALCIUM 8.8* 8.2* 8.0* 8.1*  MG  --   --  2.4  --   PHOS  --   --  2.2*  --    Liver Function Tests: Recent Labs  Lab 03/26/23 1557 03/27/23 0119 03/28/23 0338  AST 18 20 19   ALT 11 14 13   ALKPHOS 73 68 67  BILITOT 0.5 0.3 0.4  PROT 6.9 6.3* 5.6*  ALBUMIN 4.2 3.0* 2.6*   CBC: Recent Labs  Lab 03/26/23 1557 03/27/23 0119 03/28/23 0338 03/29/23 0753  WBC 14.6* 15.0* 12.3* 9.5  NEUTROABS 13.7*  --   --   --   HGB 11.9* 11.2* 10.0* 10.9*  HCT 36.1 34.5* 31.9* 34.3*  MCV 93.3 96.4 99.7 95.8  PLT 392 318 280 310   CBG: Recent Labs  Lab 03/28/23 1133 03/28/23 1805 03/28/23 2032 03/29/23 0751 03/29/23 1116  GLUCAP 126* 104* 115* 91 93   Hgb A1c No  results for input(s): "HGBA1C" in the last 72 hours. Lipid Profile No results for input(s): "CHOL", "HDL", "LDLCALC", "TRIG", "CHOLHDL", "LDLDIRECT" in the last 72 hours. Thyroid function studies No results for input(s): "TSH", "T4TOTAL", "T3FREE", "THYROIDAB" in the last 72 hours.  Invalid input(s): "FREET3" Urinalysis    Component Value Date/Time   COLORURINE YELLOW 03/26/2023 1557   APPEARANCEUR CLEAR 03/26/2023 1557   LABSPEC 1.025 03/26/2023 1557   PHURINE 7.5 03/26/2023 1557   GLUCOSEU NEGATIVE 03/26/2023 1557   GLUCOSEU NEGATIVE 11/05/2016 0923   HGBUR NEGATIVE 03/26/2023 1557   BILIRUBINUR NEGATIVE 03/26/2023 1557   BILIRUBINUR Negative 03/10/2023 0848   KETONESUR NEGATIVE 03/26/2023 1557   PROTEINUR 30 (A) 03/26/2023 1557   UROBILINOGEN 0.2 03/10/2023 0848   UROBILINOGEN 0.2 11/05/2016 0923   NITRITE NEGATIVE 03/26/2023 1557   LEUKOCYTESUR TRACE (A) 03/26/2023 1557    FURTHER DISCHARGE INSTRUCTIONS:   Get Medicines reviewed and adjusted: Please take all your medications with you for your next visit with your Primary MD   Laboratory/radiological data: Please request your Primary MD to go over all hospital tests and procedure/radiological results at the follow up, please ask your Primary MD to get all Hospital records sent to his/her office.   In some cases, they will be blood work, cultures and biopsy results pending at the time of your discharge. Please request that your primary care M.D. goes through all the records of your hospital data and follows up on these results.   Also Note the following: If you experience worsening of your admission symptoms, develop shortness of breath, life threatening emergency, suicidal or homicidal thoughts you must seek medical attention immediately by calling 911 or calling your MD immediately  if symptoms less severe.   You must read complete instructions/literature along with all the possible adverse reactions/side effects for all  the Medicines you take and that have been prescribed to you. Take any new Medicines after you have completely understood and accpet all the possible adverse reactions/side effects.    Do not drive when taking Pain medications or sleeping medications (Benzodaizepines)   Do not take more than prescribed Pain, Sleep and Anxiety Medications. It is not advisable to combine anxiety,sleep and pain medications without talking with your primary care practitioner   Special Instructions: If you have smoked or chewed Tobacco  in the last 2 yrs please stop smoking, stop any regular Alcohol  and or any Recreational drug use.   Wear Seat belts while driving.   Please note: You were cared for by a hospitalist during your hospital stay. Once you are discharged, your primary care physician will handle any further medical issues. Please note that NO REFILLS for any discharge medications will be authorized once you are discharged, as it is imperative that you return to your primary care physician (or establish a relationship with a primary care physician if you do not have one) for your post hospital discharge needs so that they can reassess your need for medications and monitor your lab values.  Time coordinating discharge: 40 minutes  SIGNED:  Pamella Pert, MD, PhD 03/29/2023, 1:36 PM

## 2023-03-30 ENCOUNTER — Telehealth: Payer: Self-pay | Admitting: *Deleted

## 2023-03-30 ENCOUNTER — Encounter: Payer: Self-pay | Admitting: *Deleted

## 2023-03-30 LAB — CULTURE, RESPIRATORY W GRAM STAIN

## 2023-03-30 NOTE — Transitions of Care (Post Inpatient/ED Visit) (Signed)
 03/30/2023  Name: Belinda Day MRN: [id number] DOB: [birth year]  Today's TOC FU Call Status: Today's TOC FU Call Status:: Successful TOC FU Call Completed TOC FU Call Complete Date: 03/30/23 Patient's Name and Date of Birth confirmed.  Transition Care Management Follow-up Telephone Call Date of Discharge: 03/29/23 Discharge Facility: [facility]) Type of Discharge: Inpatient Admission Primary Inpatient Discharge Diagnosis:: pyelonephritis How have you been since you were released from the hospital?:  (eating & drinking well, ambulating without difficulty, no issues with bowel/ bladder, no signs/ symptoms of infection pt taking antibiotic as prescribed) Any questions or concerns?: No  Items Reviewed: Did you receive and understand the discharge instructions provided?: Yes Medications obtained,verified, and reconciled?: Yes (Medications Reviewed) Any new allergies since your discharge?: No Dietary orders reviewed?: Yes Type of Diet Ordered:: heart healthy, carbohydrate modified Do you have support at home?: Yes People in Home: alone Name of Support/Comfort Primary Source: lives in town house community with lots of neighbors,  has friend she calls on and speaks with daily Patient declines enrollment in [facility] 30 day program Patient states no issues with urination, drinking adequate fluids including water Reviewed signs/ symptoms of infection, reportable signs/ symptoms  Medications Reviewed Today: Medications Reviewed Today     Reviewed by Audrie Gallus, RN (Registered Nurse) on 03/30/23 at 1417  Med List Status: <None>   Medication Order Taking? Sig Documenting Provider Last Dose Status Informant  albuterol (PROAIR HFA) 108 (90 Base) MCG/ACT inhaler [id number] Yes Inhale 2 puffs into the lungs every 6 (six) hours as needed for wheezing or shortness of breath. Nyoka Cowden, MD Taking Active Self, Pharmacy Records  alendronate (FOSAMAX) 70 MG tablet [id number] Yes Take 1 tablet  (70 mg total) by mouth every 7 (seven) days. Take with a full glass of water on an empty stomach. Shelva Majestic, MD Taking Active Self, Pharmacy Records  ALPRAZolam Prudy Feeler) 0.25 MG tablet [id number] Yes TAKE 1 TABLET BY MOUTH AT BEDTIME AS NEEDED FOR SLEEP. TAKE SPARINGLY FOR DAYTIME ANXIETY, 8 HOURS BETWEEN DOSES. DO NOT DRIVE FOR 8 HOURS  Patient taking differently: Take 0.25 mg by mouth at bedtime as needed for sleep. TAKE 1 TABLET BY MOUTH AT BEDTIME AS NEEDED FOR SLEEP. TAKE SPARINGLY FOR DAYTIME ANXIETY, 8 HOURS BETWEEN DOSES. DO NOT DRIVE FOR 8 HOURS   Shelva Majestic, MD Taking Active Self, Pharmacy Records  amitriptyline (ELAVIL) 50 MG tablet [id number] Yes Take 1 tablet (50 mg total) by mouth at bedtime. Shelva Majestic, MD Taking Active Self, Pharmacy Records  amLODipine [facility]) 5 MG tablet [id number] Yes Take 1 tablet (5 mg total) by mouth daily. Lewayne Bunting, MD Taking Active Self, Pharmacy Records  aspirin EC 81 MG tablet [id number] Yes Take 81 mg by mouth daily. [provider] Taking Active Self, Pharmacy Records  atorvastatin (LIPITOR) 80 MG tablet [id number] Yes TAKE 1 TABLET(80 MG) BY MOUTH DAILY  Patient taking differently: Take 80 mg by mouth daily. TAKE 1 TABLET(80 MG) BY MOUTH DAILY   Cannon Kettle, PA-C Taking Active Self, Pharmacy Records  calcium carbonate (OS-CAL - DOSED IN MG OF ELEMENTAL CALCIUM) 1250 (500 Ca) MG tablet [id number] Yes Take 1 tablet by mouth daily. [provider] Taking Active Self, Pharmacy Records  cefadroxil (DURICEF) 500 MG capsule [id number] Yes Take 2 capsules (1,000 mg total) by mouth 2 (two) times daily for 7 days. Leatha Gilding, MD Taking Active   cholecalciferol (VITAMIN D3) 25 MCG (1000 UNIT) tablet  [id number] Yes Take 1,000 Units by mouth daily. [provider] Taking Active Self, Pharmacy Records  Coenzyme Q10 (COQ10) 200 MG CAPS [id number] Yes Take 200 mg by mouth daily. [provider] Taking  Active Self, Pharmacy Records  cyanocobalamin (VITAMIN B12) 1000 MCG tablet [id number] Yes Take 1,000 mcg by mouth daily. [provider] Taking Active Self, Pharmacy Records  Melatonin 5 MG TABS [id number] Yes Take 5 mg by mouth at bedtime. [provider] Taking Active Self, Pharmacy Records  metFORMIN (GLUCOPHAGE) 500 MG tablet [id number] Yes TAKE 1 TABLET(500 MG) BY MOUTH TWICE DAILY WITH A MEAL  Patient taking differently: Take 500 mg by mouth in the morning and at bedtime. TAKE 1 TABLET(500 MG) BY MOUTH TWICE DAILY WITH A MEAL   Shelva Majestic, MD Taking Active Self, Pharmacy Records  mometasone-formoterol [facility]) 200-5 MCG/ACT Sandrea Matte [id number] Yes Inhale 2 puffs into the lungs in the morning and at bedtime. Glenford Bayley, NP Taking Active Self, Pharmacy Records  Multiple Vitamin (MULTIVITAMIN) tablet [id number] Yes Take 1 tablet by mouth daily. [provider] Taking Active Self, Pharmacy Records  nebivolol (BYSTOLIC) 2.5 MG tablet [id number] Yes TAKE 1 TABLET(2.5 MG) BY MOUTH DAILY  Patient taking differently: Take 2.5 mg by mouth daily.   Lewayne Bunting, MD Taking Active Self, Pharmacy Records  Respiratory Therapy Supplies [place]) DEVI [id number] Yes Use as directed Nyoka Cowden, MD Taking Active Self, Pharmacy Records  SYNTHROID 75 MCG tablet [id number] Yes TAKE 1 TABLET(75 MCG) BY MOUTH DAILY BEFORE AND BREAKFAST  Patient taking differently: Take 75 mcg by mouth in the morning. TAKE 1 TABLET(75 MCG) BY MOUTH DAILY BEFORE AND BREAKFAST   Shelva Majestic, MD Taking Active Self, Pharmacy Records  telmisartan (MICARDIS) 80 MG tablet [id number] Yes TAKE 1 TABLET(80 MG) BY MOUTH DAILY  Patient taking differently: Take 80 mg by mouth daily.   Lewayne Bunting, MD Taking Active Self, Pharmacy Records            Home Care and Equipment/Supplies: Were Home Health Services Ordered?: No Any new equipment or medical supplies ordered?: No  Functional  Questionnaire: Do you need assistance with bathing/showering or dressing?: No Do you need assistance with meal preparation?: No Do you need assistance with eating?: No Do you have difficulty maintaining continence: No Do you need assistance with getting out of bed/getting out of a chair/moving?: No Do you have difficulty managing or taking your medications?: No  Follow up appointments reviewed: PCP Follow-up appointment confirmed?: No (pt states she prefers to call and schedule her appointment) MD Provider Line Number:[phone] Given: No Specialist Hospital Follow-up appointment confirmed?: Yes Date of Specialist follow-up appointment?: 04/12/23 Follow-Up Specialty Provider:: Dr. Sherene Sires  pulmonary   @ 10 am Do you need transportation to your follow-up appointment?: No (pt drives) Do you understand care options if your condition(s) worsen?: Yes-patient verbalized understanding  SDOH Interventions Today    Flowsheet Row Most Recent Value  SDOH Interventions   Food Insecurity Interventions Intervention Not Indicated  Housing Interventions Intervention Not Indicated  Transportation Interventions Intervention Not Indicated  Utilities Interventions Intervention Not Indicated       Irving Shows [facility], BSN RN Care Manager/ Transition of Care Green/ [facility] Population Health [phone]

## 2023-03-31 ENCOUNTER — Telehealth: Payer: Self-pay | Admitting: Cardiology

## 2023-03-31 LAB — CULTURE, BLOOD (ROUTINE X 2)
Special Requests: ADEQUATE
Special Requests: ADEQUATE

## 2023-03-31 NOTE — Telephone Encounter (Signed)
 Pt c/o swelling: STAT is pt has developed SOB within 24 hours  How much weight have you gained and in what time span? Unsure   If swelling, where is the swelling located? Ankles and both feet   Are you currently taking a fluid pill? No   Are you currently SOB? Some  Do you have a log of your daily weights (if so, list)?   Have you gained 3 pounds in a day or 5 pounds in a week? N/A   Have you traveled recently? No

## 2023-03-31 NOTE — Telephone Encounter (Signed)
 Called and spoke to pt who is c/o swelling in feet/ankles.  Was recently hospitalized from 3/1-3/4 for Sepsis due to Pyelonephritis. Patient states she did receive a large amt IVF during stay and her BP and PreDM meds were held (resumed at d/c).  Pt does not have any other symptoms, only feeling, "stiffness" in feet.  After pressing on ankle/feet swelling, indentation lasts 2-3 seconds per patient. She is not on Furosemide.  Her weight is stable, at 111 lbs today (usually 110-112 lbs).  She is hydrating and urinating as she normally does.   After reviewing with DOD (Dr. Servando Salina), pt was advised to come in to see an APP.  She will see Rise Paganini, NP on 04/05/23 at 2:15 pm.    Advised pt to reach out to [place] if she develops any other symptoms, to elevate her feet as much as possible, and to keep a BP log. She verbalized understanding and in agreement with plan.

## 2023-04-05 ENCOUNTER — Ambulatory Visit: Attending: Emergency Medicine | Admitting: Emergency Medicine

## 2023-04-05 ENCOUNTER — Encounter: Payer: Self-pay | Admitting: Emergency Medicine

## 2023-04-05 VITALS — BP 120/56 | HR 80 | Ht 61.0 in | Wt 111.0 lb

## 2023-04-05 DIAGNOSIS — E785 Hyperlipidemia, unspecified: Secondary | ICD-10-CM | POA: Diagnosis not present

## 2023-04-05 DIAGNOSIS — J479 Bronchiectasis, uncomplicated: Secondary | ICD-10-CM

## 2023-04-05 DIAGNOSIS — I10 Essential (primary) hypertension: Secondary | ICD-10-CM

## 2023-04-05 DIAGNOSIS — I2510 Atherosclerotic heart disease of native coronary artery without angina pectoris: Secondary | ICD-10-CM

## 2023-04-05 DIAGNOSIS — M7989 Other specified soft tissue disorders: Secondary | ICD-10-CM | POA: Diagnosis not present

## 2023-04-05 NOTE — Progress Notes (Signed)
 Cardiology Office Note:    Date:  04/05/2023  ID:  Belinda Day, DOB October 15, 1945, MRN [id number] PCP: Belinda Majestic, MD  [facility] Providers Cardiologist:  Olga Millers, MD       Patient Profile:      Chief Complaint: 1 year follow-up for CAD  History of Present Illness:  Belinda Day is a 78 y.o. female with visit-pertinent history of coronary artery disease s/p CABG in 2002, hypertension, prediabetes, bronchiectasis, dyslipidemia, COPD, T2DM, GERD, hypothyroidism  She has history of coronary disease status post coronary artery bypass grafting 2002.  Carotid Dopplers December 2015 showed no significant obstruction.  Echocardiogram March 2017 showed normal LV function and trace aortic insufficiency.  CTA March 2017 showed no pulmonary embolus.  There was bronchiectasis ectasis noted.  Nuclear study April 2017 showed ejection fraction 64% and no ischemia or infarction.  She was last seen by Dr. Jens Som in January 2022, she was doing well with stable dyspnea secondary to her lung disease.  She was last seen in clinic on 03/10/2022.  She was doing well at the time with no medication changes.  She was to follow-up in 1 year.  She was recently admitted from 03/26/2023 through 03/29/2023 for sepsis secondary to pyelonephritis.  She was discharged home in stable condition.   Discussed the use of AI scribe software for clinical note transcription with the patient, who gave verbal consent to proceed.  The patient comes into clinic today by herself.  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  She presents following a recent hospitalization for pyelonephritis and new onset lower extremity swelling. The patient reports having been on multiple courses of antibiotics over the past six months for a leg wound and a urinary tract infection. The patient also experienced swelling in the feet and ankles after hospital discharge, which has since resolved with leg elevation.  The leg  swelling was bilateral and lasted 2 to 3 days.  The leg swelling completely resolved was only elevation.  Today she is without any leg swelling or other acute complaints.  She was without any increase in her chronic shortness of breath and denied any weight gain, orthopnea, PND, exertional angina, chest pain.      Review of systems:  Please see the history of present illness. All other systems are reviewed and otherwise negative.     Home Medications:    Current Meds  Medication Sig   albuterol (PROAIR HFA) 108 (90 Base) MCG/ACT inhaler Inhale 2 puffs into the lungs every 6 (six) hours as needed for wheezing or shortness of breath.   alendronate (FOSAMAX) 70 MG tablet Take 1 tablet (70 mg total) by mouth every 7 (seven) days. Take with a full glass of water on an empty stomach.   ALPRAZolam (XANAX) 0.25 MG tablet TAKE 1 TABLET BY MOUTH AT BEDTIME AS NEEDED FOR SLEEP. TAKE SPARINGLY FOR DAYTIME ANXIETY, 8 HOURS BETWEEN DOSES. DO NOT DRIVE FOR 8 HOURS (Patient taking differently: Take 0.25 mg by mouth at bedtime as needed for sleep. TAKE 1 TABLET BY MOUTH AT BEDTIME AS NEEDED FOR SLEEP. TAKE SPARINGLY FOR DAYTIME ANXIETY, 8 HOURS BETWEEN DOSES. DO NOT DRIVE FOR 8 HOURS)   amitriptyline (ELAVIL) 50 MG tablet Take 1 tablet (50 mg total) by mouth at bedtime.   amLODipine (NORVASC) 5 MG tablet Take 1 tablet (5 mg total) by mouth daily.   aspirin EC 81 MG tablet Take 81 mg by mouth daily.   atorvastatin (LIPITOR)  80 MG tablet TAKE 1 TABLET(80 MG) BY MOUTH DAILY (Patient taking differently: Take 80 mg by mouth daily. TAKE 1 TABLET(80 MG) BY MOUTH DAILY)   calcium carbonate (OS-CAL - DOSED IN MG OF ELEMENTAL CALCIUM) 1250 (500 Ca) MG tablet Take 1 tablet by mouth daily.   cefadroxil (DURICEF) 500 MG capsule Take 2 capsules (1,000 mg total) by mouth 2 (two) times daily for 7 days.   cholecalciferol (VITAMIN D3) 25 MCG (1000 UNIT) tablet Take 1,000 Units by mouth daily.   Coenzyme Q10 (COQ10) 200 MG CAPS  Take 200 mg by mouth daily.   cyanocobalamin (VITAMIN B12) 1000 MCG tablet Take 1,000 mcg by mouth daily.   Melatonin 5 MG TABS Take 5 mg by mouth at bedtime.   metFORMIN (GLUCOPHAGE) 500 MG tablet TAKE 1 TABLET(500 MG) BY MOUTH TWICE DAILY WITH A MEAL (Patient taking differently: Take 500 mg by mouth in the morning and at bedtime. TAKE 1 TABLET(500 MG) BY MOUTH TWICE DAILY WITH A MEAL)   mometasone-formoterol (DULERA) 200-5 MCG/ACT AERO Inhale 2 puffs into the lungs in the morning and at bedtime.   Multiple Vitamin (MULTIVITAMIN) tablet Take 1 tablet by mouth daily.   nebivolol (BYSTOLIC) 2.5 MG tablet TAKE 1 TABLET(2.5 MG) BY MOUTH DAILY (Patient taking differently: Take 2.5 mg by mouth daily.)   Respiratory Therapy Supplies (FLUTTER) DEVI Use as directed   SYNTHROID 75 MCG tablet TAKE 1 TABLET(75 MCG) BY MOUTH DAILY BEFORE AND BREAKFAST (Patient taking differently: Take 75 mcg by mouth in the morning. TAKE 1 TABLET(75 MCG) BY MOUTH DAILY BEFORE AND BREAKFAST)   telmisartan (MICARDIS) 80 MG tablet TAKE 1 TABLET(80 MG) BY MOUTH DAILY (Patient taking differently: Take 80 mg by mouth daily.)   Studies Reviewed:       Lexiscan stress test 05/08/2015 The left ventricular ejection fraction is normal (55-65%). Nuclear stress EF: 64%. There was no ST segment deviation noted during stress. The study is normal. no evidence of ischemia or infarction This is a low risk study.  Echocardiogram 04/18/2015 - Left ventricle: The cavity size was normal. Wall thickness was    normal. Systolic function was normal. The estimated ejection    fraction was in the range of 60% to 65%. Wall motion was normal;    there were no regional wall motion abnormalities. Left    ventricular diastolic function parameters were normal.  - Aortic valve: There was trivial regurgitation.  - Pulmonary arteries: Systolic pressure was mildly increased. PA    peak pressure: 31 mm Hg (S).  Risk Assessment/Calculations:              Physical Exam:   VS:  BP (!) 120/56   Pulse 80   Ht 5\' 1"  (1.549 m)   Wt 111 lb (50.3 kg)   SpO2 94%   BMI 20.97 kg/m    Wt Readings from Last 3 Encounters:  04/05/23 111 lb (50.3 kg)  03/26/23 115 lb 8.3 oz (52.4 kg)  03/10/23 113 lb 12.8 oz (51.6 kg)    GEN: Well nourished, well developed in no acute distress NECK: No JVD; No carotid bruits CARDIAC: RRR, no murmurs, rubs, gallops RESPIRATORY:  Clear to auscultation without rales, wheezing or rhonchi  ABDOMEN: Soft, non-tender, non-distended EXTREMITIES:  No edema; No acute deformity      Assessment and Plan:  Coronary artery disease H/o CABG in 2002 Lexiscan stress test 04/2015 was without ischemia or infarction -The past year and today she is without any anginal symptoms,  no indication for further ischemic evaluation at this time. -Most recent EKG 03/2023 without ischemic changes -Continue aspirin 81 mg daily, atorvastatin 80 mg daily, nebivolol 2.5 mg daily  Bilateral lower extremity swelling Acute onset of lower extremity swelling after admission for polynephritis.  Her lower bilateral extremity swelling lasted 2 to 3 days and have completely resolved with only elevation.  No skin changes or pain were noted. -Today she is without any lower extremity swelling or claudication.  No skin discoloration or changes.  She does have chronic scarring to left lower extremity s/p injury in 2024 that was previously managed by wound care which patient received multiple antibiotics for over the past year.  Currently healing appropriately with no signs of cellulitis. -She is euvolemic and well compensated in clinic today.  She is without any worsening of her chronic DOE/SOB (managed by pulmonology) -Given her symptoms have completely resolved there is no indication for further evaluation at this time.  Will have her complete daily weights and notify office if weight gain of more than 3 pounds in 1 night or 5 pounds in 1 week. -She will reach  out to office if she does develop further lower extremity swelling or other concerning symptoms for heart failure and would consider echocardiogram at that time -Encouraged sodium restriction, lower extremity stockings, daily weights  Hypertension Blood pressure today 120/56 and under excellent control -Continue amlodipine 5 mg daily, nebivolol 2.5 mg daily, telmisartan 80 mg daily -Continue to monitor BP at home  Bronchiectasis Chronic condition with intermittent dyspnea - Follow up with pulmonologist as scheduled.  Hyperlipidemia LDL 46 on 12/2022 LDL below goal of 70 and under excellent control -Continue atorvastatin 80 mg daily           Dispo:  Return in about 1 year (around 04/04/2024).  Signed, Denyce Robert, NP

## 2023-04-05 NOTE — Patient Instructions (Signed)
 Medication Instructions:  NO CHANGES    Lab Work: NONE   Testing/Procedures: NONE   Follow-Up: At [facility], you and your health needs are our priority.  As part of our continuing mission to provide you with exceptional heart care, we have created designated Provider Care Teams.  These Care Teams include your primary Cardiologist (physician) and Advanced Practice Providers (APPs -  Physician Assistants and Nurse Practitioners) who all work together to provide you with the care you need, when you need it.     Your next appointment:   1 YEAR  Provider:   DR. Jens Som OR [facility]  Other Instructions:

## 2023-04-09 ENCOUNTER — Other Ambulatory Visit: Payer: Self-pay | Admitting: Physician Assistant

## 2023-04-10 NOTE — Progress Notes (Unsigned)
 Subjective:    Patient ID: Belinda Day, female   DOB: 08-04-1945    MRN: [id number]   Brief patient profile:  78 yowf MM/ quit smoking 1972 with documented right middle lobe syndrome and evidence of bronchiectasis by CT scan in March 2002  And GOLD II criteria for copd 09/2010     History of Present Illness  08/08/07 FOB with classic cobblestoning and MAI on culture.   08/15/07 given Levaquin x 10 days with resolution bloody mucus, but "felt she had flu the whole time" with aches, feverish   August 29, 2007 ov: first post bronch co still coughing up mucus clear and initiate rx with symbicort/ [facility] START[facility]   October 19, 2007 ov no cough , sob, feeling great but no improvement on cxr   December 07, 2007 ov feeling great, minimal am cough not productive. No sob.   Opth eval, labs ok 10/2007   September 06, 2008 ov overall better over the last year, less tendency to exac on zmax and ethambutol. rec complete another year > satisfied improved 90% and stopped zmax and eth 08/2009     05/16/17 rec zmax daily but did not do  11/14/2017  f/u ov/Alfrieda Tarry re: obst bronchiectasis/ confused with details of care / taking zpak prn but also has omnicef and not sure what to do with it  Chief Complaint  Patient presents with   Follow-up    no current problems   Dyspnea:  yardwork ok / MMRC1 = can walk nl pace, flat grade, can't hurry or go uphills or steps s sob   Cough: variable esp in am / using flutter and vest prn / mucus beige and about a tbsp in am s heme Sleeping:  Bed flat and one big pilow  SABA use: rarely needed  rec Plan A = Automatic = symbicort 160  And take zmax daily  Plan B = Backup for  breathing Only use your albuterol as a rescue medication Plan B = for mucus if Nastier than nl especially assoc with any fever >  omnicef 300 mg twice daily x 7days Plan C = Call me if needed and A and B aren't working well   cxr on return     08/31/2021  f/u ov/Huxley Vanwagoner re:  obstructive bronchiectasis  maint on dulera  200/ zmax daily  Chief Complaint  Patient presents with   Follow-up    Breathing is unchanged and no new co's. She is using her albuterol inhaler 2-3 x per wk. She has noticed some difficulty swallowing recently.   Dyspnea:  yardwork when cool Cough: mainly in am  Sleeping: flat bed / one pillow SABA use: rare 02: none Covid status:   up to date Rec You will need covid update, RSV and flu shots this fall   Try dulera 100 Take 2 puffs first thing in am and then another 2 puffs about 12 hours later.        09/08/2022  yearly f/u ov/Marly Schuld re: bronchiectasis  maint on dulera 200 2bid  / daily zmax Chief Complaint  Patient presents with   Follow-up  Dyspnea:  no change / limited by L ankle pain p fell 3 weeks with laceration / just finished keflex 09/07/22 no better pain on wt bearing  or redness around wound but no fever /chills  Cough: p meals occ minimal pale green no change on keflex  Sleeping: level bed/ 2 pillows  SABA use: rarely hfa  02:  none  Rec Try prilosec otc 20mg   Take 30-60 min before first meal of the day and Pepcid ac (famotidine) 20 mg one @  bedtime  Please remember to go to the  x-ray department  for your tests - we will call you with the results when they are available    Please schedule a follow up office visit in 4 weeks, sooner if needed - bring inhaler      04/12/2023  f/u ov/Amdrew Oboyle re: obstructive bronchiectasis  maint on dulera 200   Chief Complaint  Patient presents with   Follow-up  Dyspnea:  back doing yardwork with sats 95% RA  Cough: variable vol/ color never bloody / worse in am Sleeping: level bed one pillow  resp cc  SABA use: none  02: none     No obvious day to day or daytime variability or assoc   mucus plugs or hemoptysis or cp or chest tightness, subjective wheeze or overt sinus or hb symptoms.    Also denies any obvious fluctuation of symptoms with weather or environmental changes or other  aggravating or alleviating factors except as outlined above   No unusual exposure hx or h/o childhood pna/ asthma or knowledge of premature birth.  Current Allergies, Complete Past Medical History, Past Surgical History, Family History, and Social History were reviewed in [employer] record.  ROS  The following are not active complaints unless bolded Hoarseness, sore throat, dysphagia ? Since started fosamax, dental problems, itching, sneezing,  nasal congestion or discharge of excess mucus or purulent secretions, ear ache,   fever, chills, sweats, unintended wt loss or wt gain, classically pleuritic or exertional cp,  orthopnea pnd or arm/hand swelling  or leg swelling, presyncope, palpitations, abdominal pain, anorexia, nausea, vomiting, diarrhea  or change in bowel habits or change in bladder habits, change in stools or change in urine, dysuria, hematuria,  rash, arthralgias, visual complaints, headache, numbness, weakness or ataxia or problems with walking or coordination,  change in mood or  memory.        Current Meds  Medication Sig   albuterol (PROAIR HFA) 108 (90 Base) MCG/ACT inhaler Inhale 2 puffs into the lungs every 6 (six) hours as needed for wheezing or shortness of breath.   alendronate (FOSAMAX) 70 MG tablet Take 1 tablet (70 mg total) by mouth every 7 (seven) days. Take with a full glass of water on an empty stomach.   ALPRAZolam (XANAX) 0.25 MG tablet TAKE 1 TABLET BY MOUTH AT BEDTIME AS NEEDED FOR SLEEP. TAKE SPARINGLY FOR DAYTIME ANXIETY, 8 HOURS BETWEEN DOSES. DO NOT DRIVE FOR 8 HOURS (Patient taking differently: Take 0.25 mg by mouth at bedtime as needed for sleep. TAKE 1 TABLET BY MOUTH AT BEDTIME AS NEEDED FOR SLEEP. TAKE SPARINGLY FOR DAYTIME ANXIETY, 8 HOURS BETWEEN DOSES. DO NOT DRIVE FOR 8 HOURS)   amitriptyline (ELAVIL) 50 MG tablet Take 1 tablet (50 mg total) by mouth at bedtime.   amLODipine (NORVASC) 5 MG tablet Take 1 tablet (5 mg total) by  mouth daily.   aspirin EC 81 MG tablet Take 81 mg by mouth daily.   atorvastatin (LIPITOR) 80 MG tablet TAKE 1 TABLET(80 MG) BY MOUTH DAILY   calcium carbonate (OS-CAL - DOSED IN MG OF ELEMENTAL CALCIUM) 1250 (500 Ca) MG tablet Take 1 tablet by mouth daily.   cholecalciferol (VITAMIN D3) 25 MCG (1000 UNIT) tablet Take 1,000 Units by mouth daily.   Coenzyme Q10 (COQ10) 200 MG CAPS Take 200  mg by mouth daily.   cyanocobalamin (VITAMIN B12) 1000 MCG tablet Take 1,000 mcg by mouth daily.   Melatonin 5 MG TABS Take 5 mg by mouth at bedtime.   metFORMIN (GLUCOPHAGE) 500 MG tablet TAKE 1 TABLET(500 MG) BY MOUTH TWICE DAILY WITH A MEAL (Patient taking differently: Take 500 mg by mouth in the morning and at bedtime. TAKE 1 TABLET(500 MG) BY MOUTH TWICE DAILY WITH A MEAL)   mometasone-formoterol (DULERA) 100-5 MCG/ACT AERO Take 2 puffs first thing in am and then another 2 puffs about 12 hours later.   Multiple Vitamin (MULTIVITAMIN) tablet Take 1 tablet by mouth daily.   nebivolol (BYSTOLIC) 2.5 MG tablet TAKE 1 TABLET(2.5 MG) BY MOUTH DAILY (Patient taking differently: Take 2.5 mg by mouth daily.)   Respiratory Therapy Supplies (FLUTTER) DEVI Use as directed   SYNTHROID 75 MCG tablet TAKE 1 TABLET(75 MCG) BY MOUTH DAILY BEFORE AND BREAKFAST (Patient taking differently: Take 75 mcg by mouth in the morning. TAKE 1 TABLET(75 MCG) BY MOUTH DAILY BEFORE AND BREAKFAST)   telmisartan (MICARDIS) 80 MG tablet TAKE 1 TABLET(80 MG) BY MOUTH DAILY (Patient taking differently: Take 80 mg by mouth daily.)   [DISCONTINUED] mometasone-formoterol (DULERA) 200-5 MCG/ACT AERO Inhale 2 puffs into the lungs in the morning and at bedtime.                     Past Medical History:  Bronchiectasis see CT SE 04/13/00  - HFA 75% November 19, 2009  - alpha one screen 01/14/2015 >  MM  - IgE 01/14/2015 = 11  MAI  - Rx Zmax and ETH 08/29/07 > 08/2009 restarted empirically 05/31/14 > 09/03/14 (no change in cough so just use zpak for  flares)  - Rx zmax maint  10/16/2015 >>> d/c 08/03/2016 > restarted 05/16/17 and clinically improved 08/02/2017 using prn omnicef to supplment  - Eye eval   10/09.......................[name]  - Intol of levaquin so try cycles of cipro April 02, 2010  HEALTH MAINTENANCE...........................[name] - Td 10/2007  - Pneumovax 2005   and 10/29/2011 age 16, prevnar 08/16/2013  CAD  Hyperlipidemia  Hypertension  History of cough with ACE inhibition.  Gastroesophageal reflux disease         Objective:   Physical Exam  Wts  04/12/2023   109   09/08/2022  112  08/31/2021    114 08/25/2020    110    02/26/2020   116 08/27/2019    119 02/16/2019  124 08/15/2018  123  Wt 130 October 19, 2007>140 March 31, 2010 > 127 07/16/2010 > 10/14/2010  120 > 05/04/2011  117 > 07/30/2011  120 > 10/29/2011 118 > 130  05/01/2012 > 12/12/2012 132 >  08/14/13 137 >    02/20/2014  137 >  05/31/2014 134 > 09/03/2014    140 > 10/15/2014 137 > 01/14/2015 137 >  05/15/2015 123 >08/14/2015  124 >  10/16/2015 126 > 03/02/2016   124  > 05/04/2016  126 > 08/03/2016   130 > 02/14/2017  126 > 05/16/2017 128 > 08/02/2017  119 > 11/14/2017  116     Vital signs reviewed  04/12/2023  - Note at rest 02 sats  98% on RA   General appearance:    pleasant wf freq throat clearing     HEENT :  Oropharynx  clear   Nasal turbinates nl    NECK :  without JVD/Nodes/TM/ nl carotid upstrokes bilaterally   LUNGS: no acc muscle use,  Mod barrel  contour chest wall with bilateral mild but coarse  insp /exp rhonchi  and  without cough on insp or exp maneuvers and mod  Hyperresonant  to  percussion bilaterally     CV:  RRR  no s3 or murmur or increase in P2, and no edema   ABD:  soft and nontender with pos mid insp Hoover's  in the supine position. No bruits or organomegaly appreciated, bowel sounds nl  MS:   Ext warm without deformities or   obvious joint restrictions , calf tenderness, cyanosis or clubbing  SKIN: warm and dry without lesions    NEURO:  alert,  approp, nl sensorium with  no motor or cerebellar deficits apparent.              Assessment:

## 2023-04-12 ENCOUNTER — Ambulatory Visit: Payer: Medicare Other | Admitting: Internal Medicine

## 2023-04-12 ENCOUNTER — Encounter: Payer: Self-pay | Admitting: Internal Medicine

## 2023-04-12 VITALS — BP 120/58 | HR 77 | Temp 98.9°F | Ht 61.0 in | Wt 109.4 lb

## 2023-04-12 DIAGNOSIS — J479 Bronchiectasis, uncomplicated: Secondary | ICD-10-CM

## 2023-04-12 DIAGNOSIS — Z87891 Personal history of nicotine dependence: Secondary | ICD-10-CM

## 2023-04-12 MED ORDER — MOMETASONE FURO-FORMOTEROL FUM 100-5 MCG/ACT IN AERO: INHALATION_SPRAY | RESPIRATORY_TRACT | 11 refills | Status: DC

## 2023-04-12 NOTE — Assessment & Plan Note (Signed)
 Onset of symptoms around 2002     - PFT's 10/14/2010  FEV1  1.25 (68%) and ratio 58% and DLCO 90%     - PFT's 10/29/2011  FEV1  1.33 (74%) and ratio 57 % and DLCO 93%    - PFTs 08/14/2013   FEV1  1.16 (60%) and ratio 61 with dlco 83%     - Flutter valve added 09/03/14      - alpha one   01/14/2015 >  MM, level 147     - IgE  01/14/15  11 - CT chest 04/17/15 Marked chronic bronchiectasis and volume loss in the right middle lobe with milder bronchiectasis, bronchial wall thickening, and nodular densities throughout the right upper and right lower lobe suggestive of chr onic endobronchial/atypical mycobacterial infection. - 10/16/2015 changed to symbicort 80 2bid (? Higher doses contributing to w MAI /freq of infections)   - 03/02/2016  After extensive coaching HFA effectiveness =    90%  - 05/04/2016 VEST stared around May 25 2016 - try off zmax 08/03/2016 > no change clinically as of 11/05/2016  - 02/14/2017 cycles of omnicef x 7 days prn purulent sputum  PFT's  05/16/2017  FEV1 0.89 (47 % ) ratio 60  p 12 % improvement from saba p nothing prior to study   - 05/16/2017  After extensive coaching inhaler device  effectiveness =    90%  - 05/16/17 quant Ig's ok x M slt low (not acutely ill at the time)  -zmax daily restarted 05/16/17 and clinically improved 08/02/2017 using prn omnicef to supplment but did not continue zmax as rec  - restart zmax daily 11/14/2017 > improved 02/14/2018  - 08/25/2020  After extensive coaching inhaler device,  effectiveness =    95%  > continue symbicort 160 and prn saba - 08/31/2021 rec try dulera 100 to reduce irritation upper airway and increase immune response to potential infections > worse breathing so increased back to 200  bid   - 04/12/2023  After extensive coaching inhaler device,  effectiveness = 80%      > try again to use dulera 100 due to risk of infection   Cautioned re risk of fosamax given new dysphagia and ? Need to consider IV  or IM options per Dr Durene Cal  F/u q 6 m  sooner prn

## 2023-04-12 NOTE — Patient Instructions (Addendum)
 Try dulera 100 Take 2 puffs first thing in am and then another 2 puffs about 12 hours later  - if breathing worsens over the next few weeks go back to the 200 strength   Also add pepcid  20 mg after breakfast and supper to see if swallowing gets better - if not may need to reconsider Fosamax per Dr Durene Cal  Please schedule a follow up visit in 6 months but call sooner if needed

## 2023-04-22 DIAGNOSIS — Z1231 Encounter for screening mammogram for malignant neoplasm of breast: Secondary | ICD-10-CM | POA: Diagnosis not present

## 2023-04-22 LAB — HM MAMMOGRAPHY

## 2023-04-25 ENCOUNTER — Encounter: Payer: Self-pay | Admitting: Family Medicine

## 2023-05-14 ENCOUNTER — Other Ambulatory Visit: Payer: Self-pay | Admitting: Family Medicine

## 2023-05-16 ENCOUNTER — Other Ambulatory Visit: Payer: Self-pay | Admitting: Family Medicine

## 2023-06-27 ENCOUNTER — Other Ambulatory Visit: Payer: Self-pay | Admitting: Family Medicine

## 2023-07-19 ENCOUNTER — Encounter: Payer: Self-pay | Admitting: Family Medicine

## 2023-07-19 ENCOUNTER — Ambulatory Visit: Payer: Self-pay | Admitting: Family Medicine

## 2023-07-19 ENCOUNTER — Ambulatory Visit: Payer: Medicare Other | Admitting: Family Medicine

## 2023-07-19 VITALS — BP 110/60 | HR 65 | Temp 97.5°F | Resp 16 | Ht 61.0 in | Wt 113.5 lb

## 2023-07-19 DIAGNOSIS — E119 Type 2 diabetes mellitus without complications: Secondary | ICD-10-CM | POA: Diagnosis not present

## 2023-07-19 DIAGNOSIS — I700 Atherosclerosis of aorta: Secondary | ICD-10-CM

## 2023-07-19 DIAGNOSIS — E039 Hypothyroidism, unspecified: Secondary | ICD-10-CM | POA: Diagnosis not present

## 2023-07-19 DIAGNOSIS — Z7984 Long term (current) use of oral hypoglycemic drugs: Secondary | ICD-10-CM

## 2023-07-19 DIAGNOSIS — G8929 Other chronic pain: Secondary | ICD-10-CM

## 2023-07-19 DIAGNOSIS — M545 Low back pain, unspecified: Secondary | ICD-10-CM

## 2023-07-19 DIAGNOSIS — Z0000 Encounter for general adult medical examination without abnormal findings: Secondary | ICD-10-CM | POA: Diagnosis not present

## 2023-07-19 DIAGNOSIS — I2510 Atherosclerotic heart disease of native coronary artery without angina pectoris: Secondary | ICD-10-CM

## 2023-07-19 DIAGNOSIS — I10 Essential (primary) hypertension: Secondary | ICD-10-CM | POA: Diagnosis not present

## 2023-07-19 DIAGNOSIS — I2583 Coronary atherosclerosis due to lipid rich plaque: Secondary | ICD-10-CM

## 2023-07-19 DIAGNOSIS — E785 Hyperlipidemia, unspecified: Secondary | ICD-10-CM

## 2023-07-19 LAB — CBC WITH DIFFERENTIAL/PLATELET
Basophils Absolute: 0 10*3/uL (ref 0.0–0.1)
Basophils Relative: 0.4 % (ref 0.0–3.0)
Eosinophils Absolute: 0.1 10*3/uL (ref 0.0–0.7)
Eosinophils Relative: 2.6 % (ref 0.0–5.0)
HCT: 35.7 % — ABNORMAL LOW (ref 36.0–46.0)
Hemoglobin: 12 g/dL (ref 12.0–15.0)
Lymphocytes Relative: 15.9 % (ref 12.0–46.0)
Lymphs Abs: 0.9 10*3/uL (ref 0.7–4.0)
MCHC: 33.5 g/dL (ref 30.0–36.0)
MCV: 92 fl (ref 78.0–100.0)
Monocytes Absolute: 0.4 10*3/uL (ref 0.1–1.0)
Monocytes Relative: 7.7 % (ref 3.0–12.0)
Neutro Abs: 4.2 10*3/uL (ref 1.4–7.7)
Neutrophils Relative %: 73.4 % (ref 43.0–77.0)
Platelets: 309 10*3/uL (ref 150.0–400.0)
RBC: 3.88 Mil/uL (ref 3.87–5.11)
RDW: 13.7 % (ref 11.5–15.5)
WBC: 5.7 10*3/uL (ref 4.0–10.5)

## 2023-07-19 LAB — COMPREHENSIVE METABOLIC PANEL WITH GFR
ALT: 16 U/L (ref 0–35)
AST: 25 U/L (ref 0–37)
Albumin: 4.1 g/dL (ref 3.5–5.2)
Alkaline Phosphatase: 67 U/L (ref 39–117)
BUN: 11 mg/dL (ref 6–23)
CO2: 28 meq/L (ref 19–32)
Calcium: 8.8 mg/dL (ref 8.4–10.5)
Chloride: 103 meq/L (ref 96–112)
Creatinine, Ser: 0.71 mg/dL (ref 0.40–1.20)
GFR: 80.36 mL/min (ref >60.00)
Glucose, Bld: 99 mg/dL (ref 70–99)
Potassium: 3.9 meq/L (ref 3.5–5.1)
Sodium: 137 meq/L (ref 135–145)
Total Bilirubin: 0.3 mg/dL (ref 0.2–1.2)
Total Protein: 6.7 g/dL (ref 6.0–8.3)

## 2023-07-19 LAB — MICROALBUMIN / CREATININE URINE RATIO
Creatinine,U: 27.7 mg/dL
Microalb Creat Ratio: UNDETERMINED mg/g (ref 0.0–30.0)
Microalb, Ur: <0.7 mg/dL

## 2023-07-19 LAB — LIPID PANEL
Cholesterol: 115 mg/dL (ref 0–200)
HDL: 60.5 mg/dL (ref >39.00)
LDL Cholesterol: 44 mg/dL (ref 0–99)
NonHDL: 54.92
Total CHOL/HDL Ratio: 2
Triglycerides: 56 mg/dL (ref 0.0–149.0)
VLDL: 11.2 mg/dL (ref 0.0–40.0)

## 2023-07-19 LAB — TSH: TSH: 1.9 u[IU]/mL (ref 0.35–5.50)

## 2023-07-19 LAB — HEMOGLOBIN A1C: Hgb A1c MFr Bld: 6.4 % (ref 4.6–6.5)
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
## 2023-07-19 NOTE — Patient Instructions (Addendum)
 Please stop by lab before you go If you have mychart- we will send your results within 3 business days of us  receiving them.  If you do not have mychart- we will call you about results within 5 business days of us  receiving them.  *please also note that you will see labs on mychart as soon as they post. I will later go in and write notes on them- will say notes from Dr. Katrinka   We have placed a referral for you today to [facility]- please call their # if you do not hear within a week (may be listed below or you may see mychart message within a few days with #).   blood pressure well controlled but has some swelling particularly as the day goes on with the amlodipine - I want her to try just a half tablet- if this works for blood pressure and helps swelling I can send in a smaller dose of 2.5 mg- asked her to let me know in 2-3 weeks how she is doing   Recommended follow up: Return in about 6 months (around 01/18/2024) for followup or sooner if needed.Schedule b4 you leave.

## 2023-07-19 NOTE — Progress Notes (Signed)
No further action needed. Pt was seen today.

## 2023-07-20 ENCOUNTER — Ambulatory Visit

## 2023-07-20 VITALS — Ht 61.0 in | Wt 113.0 lb

## 2023-07-20 DIAGNOSIS — Z0000 Encounter for general adult medical examination without abnormal findings: Secondary | ICD-10-CM | POA: Diagnosis not present

## 2023-07-20 NOTE — Patient Instructions (Signed)
 Belinda Day , Thank you for taking time out of your busy schedule to complete your Annual Wellness Visit with me. I enjoyed our conversation and look forward to speaking with you again next year. I, as well as your care team,  appreciate your ongoing commitment to your health goals. Please review the following plan we discussed and let me know if I can assist you in the future. Your Game plan/ To Do List    Referrals: If you haven't heard from the office you've been referred to, please reach out to them at the phone provided.   Follow up Visits: Next Medicare AWV with our clinical staff: 07/26/24   Have you seen your provider in the last 6 months (3 months if uncontrolled diabetes)? Yes Next Office Visit with your provider: 01/16/24  Clinician Recommendations:  Aim for 30 minutes of exercise or brisk walking, 6-8 glasses of water, and 5 servings of fruits and vegetables each day.       This is a list of the screening recommended for you and due dates:  Health Maintenance  Topic Date Due   COVID-19 Vaccine (8 - Moderna risk 2024-25 season) 08/04/2023*   Flu Shot  08/26/2023   Eye exam for diabetics  11/19/2023   Hemoglobin A1C  01/18/2024   Mammogram  04/21/2024   Yearly kidney function blood test for diabetes  07/18/2024   Yearly kidney health urinalysis for diabetes  07/18/2024   Complete foot exam   07/18/2024   Medicare Annual Wellness Visit  07/19/2024   DEXA scan (bone density measurement)  08/08/2024   Colon Cancer Screening  01/02/2026   DTaP/Tdap/Td vaccine (2 - Tdap) 05/14/2029   Pneumococcal Vaccine for age over 38  Completed   Zoster (Shingles) Vaccine  Completed   Hepatitis B Vaccine  Aged Out   HPV Vaccine  Aged Out   Meningitis B Vaccine  Aged Out   Hepatitis C Screening  Discontinued  *Topic was postponed. The date shown is not the original due date.    Advanced directives: (Copy Requested) Please bring a copy of your health care power of attorney and living will to  the office to be added to your chart at your convenience. You can mail to [facility] [address] [place], [place] [address] or email to [email or web address] Advance Care Planning is important because it:  [x]  Makes sure you receive the medical care that is consistent with your values, goals, and preferences  [x]  It provides guidance to your family and loved ones and reduces their decisional burden about whether or not they are making the right decisions based on your wishes.  Follow the link provided in your after visit summary or read over the paperwork we have mailed to you to help you started getting your Advance Directives in place. If you need assistance in completing these, please reach out to us  so that we can help you!  See attachments for Preventive Care and Fall Prevention Tips.

## 2023-07-20 NOTE — Progress Notes (Addendum)
 Subjective:   Belinda Day is a 78 y.o. who presents for a Medicare Wellness preventive visit.  As a reminder, Annual Wellness Visits don't include a physical exam, and some assessments may be limited, especially if this visit is performed virtually. We may recommend an in-person follow-up visit with your provider if needed.  Visit Complete: Virtual I connected with  Reena DELENA Rash on 07/20/23 by a audio enabled telemedicine application and verified that I am speaking with the correct person using two identifiers.  Patient Location: Home  Provider Location: Home Office  I discussed the limitations of evaluation and management by telemedicine. The patient expressed understanding and agreed to proceed.  Vital Signs: Because this visit was a virtual/telehealth visit, some criteria may be missing or patient reported. Any vitals not documented were not able to be obtained and vitals that have been documented are patient reported.  VideoDeclined- This patient declined Librarian, academic. Therefore the visit was completed with audio only.  Persons Participating in Visit: Patient.  AWV Questionnaire: No: Patient Medicare AWV questionnaire was not completed prior to this visit.  Cardiac Risk Factors include: advanced age (>37men, >73 women);diabetes mellitus;dyslipidemia;hypertension     Objective:    Today's Vitals   07/20/23 1340  Weight: 113 lb (51.3 kg)  Height: 5' 1 (1.549 m)   Body mass index is 21.35 kg/m.     07/20/2023    1:45 PM 03/26/2023    8:57 PM 03/26/2023    3:23 PM 01/03/2023    8:07 AM 10/14/2022    8:14 AM 10/01/2022    2:03 PM 07/12/2022    2:13 PM  Advanced Directives  Does Patient Have a Medical Advance Directive? Yes Yes No Yes No No Yes  Type of Estate agent of [place];Living will Healthcare Power of [place];Living will  Healthcare Power of [place];Living will   Healthcare Power of [place];Living will   Does patient want to make changes to medical advance directive?  No - Patient declined       Copy of Healthcare Power of Attorney in Chart? No - copy requested   No - copy requested   No - copy requested  Would patient like information on creating a medical advance directive?   No - Patient declined   No - Patient declined     Current Medications (verified) Outpatient Encounter Medications as of 07/20/2023  Medication Sig   albuterol  (PROAIR  HFA) 108 (90 Base) MCG/ACT inhaler Inhale 2 puffs into the lungs every 6 (six) hours as needed for wheezing or shortness of breath.   alendronate  (FOSAMAX ) 70 MG tablet Take 1 tablet (70 mg total) by mouth every 7 (seven) days. Take with a full glass of water on an empty stomach.   ALPRAZolam  (XANAX ) 0.25 MG tablet TAKE 1 TABLET BY MOUTH AT BEDTIME AS NEEDED FOR SLEEP. TAKE SPARINGLY FOR DAYTIME ANXIETY. 8 HOURS BETWEEN DOSES. DO NOT DRIVE FOR 8 HOURS   amitriptyline  (ELAVIL ) 50 MG tablet TAKE 1 TABLET(50 MG) BY MOUTH AT BEDTIME   amLODipine  (NORVASC ) 5 MG tablet Take 1 tablet (5 mg total) by mouth daily.   aspirin  EC 81 MG tablet Take 81 mg by mouth daily.   atorvastatin  (LIPITOR ) 80 MG tablet TAKE 1 TABLET(80 MG) BY MOUTH DAILY   azithromycin  (ZITHROMAX ) 250 MG tablet Take 250 mg by mouth daily.   calcium  carbonate (OS-CAL - DOSED IN MG OF ELEMENTAL CALCIUM ) 1250 (500 Ca) MG tablet Take 1 tablet by mouth daily.  cholecalciferol (VITAMIN D3) 25 MCG (1000 UNIT) tablet Take 1,000 Units by mouth daily.   Coenzyme Q10 (COQ10) 200 MG CAPS Take 200 mg by mouth daily.   cyanocobalamin  (VITAMIN B12) 1000 MCG tablet Take 1,000 mcg by mouth daily.   Melatonin 5 MG TABS Take 5 mg by mouth at bedtime.   metFORMIN  (GLUCOPHAGE ) 500 MG tablet TAKE 1 TABLET(500 MG) BY MOUTH TWICE DAILY WITH A MEAL   mometasone -formoterol  (DULERA ) 100-5 MCG/ACT AERO Take 2 puffs first thing in am and then another 2 puffs about 12 hours later.   Multiple Vitamin (MULTIVITAMIN) tablet  Take 1 tablet by mouth daily.   nebivolol  (BYSTOLIC ) 2.5 MG tablet TAKE 1 TABLET(2.5 MG) BY MOUTH DAILY (Patient taking differently: Take 2.5 mg by mouth daily.)   Respiratory Therapy Supplies (FLUTTER) DEVI Use as directed   SYNTHROID  75 MCG tablet TAKE 1 TABLET(75 MCG) BY MOUTH DAILY BEFORE AND BREAKFAST (Patient taking differently: Take 75 mcg by mouth in the morning. TAKE 1 TABLET(75 MCG) BY MOUTH DAILY BEFORE AND BREAKFAST)   telmisartan  (MICARDIS ) 80 MG tablet TAKE 1 TABLET(80 MG) BY MOUTH DAILY (Patient taking differently: Take 80 mg by mouth daily.)   No facility-administered encounter medications on file as of 07/20/2023.    Allergies (verified) Ciprofloxacin, Codeine, Erythromycin, Levofloxacin, Tramadol, and Sulfamethoxazole -trimethoprim   History: Past Medical History:  Diagnosis Date   Bronchiectasis    oxygen  at night in the past   CAD (coronary artery disease)    Cellulitis    COPD (chronic obstructive pulmonary disease) (HCC) bronchiectasis   Diabetes mellitus without complication (HCC) pre-diabetes   Diverticulitis 01/26/2012   GERD (gastroesophageal reflux disease)    History of shingles 04/25/2012   HTN (hypertension)    Hyperlipidemia    Hypothyroidism    MAI (mycobacterium avium-intracellulare) (HCC)    Medication management 10/21/2022   Neuritis of upper extremity    Osteoporosis 08/14/2011    02/16/17 discuss DEXA. 02.2 at R femur neck23% 10 year risk and hip fracture risk 12%. but #s were elevated  Only takes vitamin D  and essentially all areas evaluated on bone density were stable from 2016 to 2018. We opted with the stability to not start fosamax  and repeat in 12/2018 or 01/2019. [facility] DEXA  Stable. vitamin D   [facility] DEXA. L femur neck -2.1 in 2016 after -2.2 11/11/08. AP tota*   Substance abuse [facility])    Past Surgical History:  Procedure Laterality Date   COLON SURGERY     numerous polyps removed   COLONOSCOPY WITH PROPOFOL  N/A 01/21/2022   Procedure:  COLONOSCOPY WITH PROPOFOL ;  Surgeon: Mansouraty, Aloha Raddle., MD;  Location: [facility];  Service: Gastroenterology;  Laterality: N/A;   COLONOSCOPY WITH PROPOFOL  N/A 01/03/2023   Procedure: COLONOSCOPY WITH PROPOFOL ;  Surgeon: Wilhelmenia Aloha Raddle., MD;  Location: [facility];  Service: Gastroenterology;  Laterality: N/A;   CORONARY ARTERY BYPASS GRAFT  01/25/2002   x3 CABG   ENDOSCOPIC MUCOSAL RESECTION N/A 01/21/2022   Procedure: ENDOSCOPIC MUCOSAL RESECTION;  Surgeon: Wilhelmenia Aloha Raddle., MD;  Location: [facility];  Service: Gastroenterology;  Laterality: N/A;   HAND SURGERY     Dr. Camella sept 2022- improved hand movement   HEMOSTASIS CLIP PLACEMENT  01/21/2022   Procedure: HEMOSTASIS CLIP PLACEMENT;  Surgeon: Wilhelmenia Aloha Raddle., MD;  Location: [facility];  Service: Gastroenterology;;   HOT HEMOSTASIS N/A 01/21/2022   Procedure: HOT HEMOSTASIS (ARGON PLASMA COAGULATION/BICAP);  Surgeon: Wilhelmenia Aloha Raddle., MD;  Location: [facility];  Service: Gastroenterology;  Laterality:  N/A;   POLYPECTOMY  01/21/2022   Procedure: POLYPECTOMY;  Surgeon: Mansouraty, Aloha Raddle., MD;  Location: [facility];  Service: Gastroenterology;;   POLYPECTOMY  01/03/2023   Procedure: POLYPECTOMY;  Surgeon: Wilhelmenia Aloha Raddle., MD;  Location: [facility];  Service: Gastroenterology;;   SUBMUCOSAL LIFTING INJECTION  01/21/2022   Procedure: SUBMUCOSAL LIFTING INJECTION;  Surgeon: Wilhelmenia Aloha Raddle., MD;  Location: [facility];  Service: Gastroenterology;;   Family History  Problem Relation Age of Onset   Colon cancer Mother    Hyperlipidemia Mother    Hypertension Mother    Heart disease Mother    Stroke Mother    Diabetes Mother    Cancer Mother    Arthritis Mother    COPD Mother    Colon cancer Father    Arthritis Father    Hyperlipidemia Father    Hypertension Father    Heart disease Father    Stroke Father    Cancer Father    Vision loss Father    Colon cancer  Maternal Grandmother    Cancer Maternal Grandmother    Atopy Neg Hx    Stomach cancer Neg Hx    Esophageal cancer Neg Hx    Pancreatic cancer Neg Hx    Inflammatory bowel disease Neg Hx    Liver disease Neg Hx    Rectal cancer Neg Hx    Social History   Socioeconomic History   Marital status: Single    Spouse name: Not on file   Number of children: Not on file   Years of education: Not on file   Highest education level: Bachelor's degree (e.g., BA, AB, BS)  Occupational History   Occupation: Retired   Tobacco Use   Smoking status: Former    Current packs/day: 0.00    Average packs/day: 1 pack/day for 30.0 years (30.0 ttl pk-yrs)    Types: Cigarettes    Start date: 01/26/1955    Quit date: 01/26/1980    Years since quitting: 43.5   Smokeless tobacco: Never  Substance and Sexual Activity   Alcohol use: No   Drug use: No   Sexual activity: Not Currently    Birth control/protection: None  Other Topics Concern   Not on file  Social History Narrative   Family: Single never married, no children, cat and rehabs turtles and tortoise   Went to [employer] in [place] and social work, some business courses at [employer], EMT for 6 years.    LIves alone. Completely independent.    Lives in retirement community.       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      Hobbies: kayaking, gardening- mows own lawn   Social Drivers of Corporate investment banker Strain: Low Risk  (07/20/2023)   Overall Financial Resource Strain (CARDIA)    Difficulty of Paying Living Expenses: Not hard at all  Food Insecurity: No Food Insecurity (07/20/2023)   Hunger Vital Sign    Worried About Running Out of Food in the Last Year: Never true    Ran Out of Food in the Last Year: Never true  Transportation Needs: No Transportation Needs (07/20/2023)   PRAPARE - Administrator, Civil Service (Medical): No    Lack of Transportation (Non-Medical):  No  Physical Activity: Insufficiently Active (07/20/2023)   Exercise Vital Sign    Days of Exercise per Week: 1 day    Minutes of Exercise per Session: 60 min  Stress: No Stress  Concern Present (07/20/2023)   [employer] of Occupational Health - Occupational Stress Questionnaire    Feeling of Stress: Not at all  Social Connections: Socially Isolated (07/20/2023)   Social Connection and Isolation Panel    Frequency of Communication with Friends and Family: Once a week    Frequency of Social Gatherings with Friends and Family: Once a week    Attends Religious Services: 1 to 4 times per year    Active Member of [employer] or Organizations: No    Attends Engineer, structural: Never    Marital Status: Never married    Tobacco Counseling Counseling given: Not Answered    Clinical Intake:  Pre-visit preparation completed: Yes  Pain : No/denies pain     BMI - recorded: 21.35 Nutritional Status: BMI of 19-24  Normal Nutritional Risks: None Diabetes: No  Lab Results  Component Value Date   HGBA1C 6.4 07/19/2023   HGBA1C 6.2 01/06/2023   HGBA1C 5.9 06/30/2022               Activities of Daily Living     07/20/2023    1:41 PM 07/19/2023    5:00 PM  In your present state of health, do you have any difficulty performing the following activities:  Hearing? 0 0  Vision? 0 0  Difficulty concentrating or making decisions? 0 0  Walking or climbing stairs? 0 0  Dressing or bathing? 0 0  Doing errands, shopping? 0 0  Preparing Food and eating ? N N  Using the Toilet? N N  In the past six months, have you accidently leaked urine? N N  Do you have problems with loss of bowel control? N N  Managing your Medications? N N  Managing your Finances? N N  Housekeeping or managing your Housekeeping? N N    Patient Care Team: Katrinka Garnette KIDD, MD as PCP - General (Family Medicine) Pietro Redell RAMAN, MD as PCP - Cardiology (Cardiology) Pa, [facility]  as Consulting Physician (Ophthalmology) Darlean Ozell NOVAK, MD as Consulting Physician (Pulmonary Disease) Camella Fallow, MD as Consulting Physician (Orthopedic Surgery) [place], Amy, MD as Consulting Physician (Dermatology) Nicholaus Sherlean CROME, [facility] (Inactive) (Pharmacist)  I have updated your Care Teams any recent Medical Services you may have received from other providers in the past year.     Assessment:   This is a routine wellness examination for Berry.  Hearing/Vision screen Hearing Screening - Comments:: Pt denies any hearing issues  Vision Screening - Comments:: Wears rx glasses - up to date with routine eye exams with Dr Octavia    Goals Addressed   None    Depression Screen     07/20/2023    1:43 PM 01/06/2023    8:17 AM 11/05/2022    8:26 AM 11/05/2022    8:24 AM 07/12/2022    2:11 PM 06/30/2022    9:24 AM 07/06/2021    2:21 PM  PHQ 2/9 Scores  PHQ - 2 Score 0 0 0 2 0 0 0  PHQ- 9 Score  0 0 5 0 0     Fall Risk     07/20/2023    1:45 PM 07/19/2023    5:00 PM 04/12/2023   10:08 AM 01/06/2023    8:11 AM 11/05/2022    8:24 AM  Fall Risk   Falls in the past year? 0 0 1 1 1   Number falls in past yr: 0 0 0 0 0  Injury with Fall?   1  1 1  Risk for fall due to : No Fall Risks  Other (Comment) History of fall(s) History of fall(s)  Follow up Falls prevention discussed   Falls evaluation completed Falls evaluation completed    MEDICARE RISK AT HOME:  Medicare Risk at Home Any stairs in or around the home?: No Home free of loose throw rugs in walkways, pet beds, electrical cords, etc?: Yes Adequate lighting in your home to reduce risk of falls?: Yes Life alert?: Yes Use of a cane, walker or w/c?: No Grab bars in the bathroom?: Yes Shower chair or bench in shower?: No Elevated toilet seat or a handicapped toilet?: No  TIMED UP AND GO:  Was the test performed?  No  Cognitive Function: 6CIT completed    08/11/2017    9:44 AM  MMSE - Mini Mental State Exam  Not  completed: --        07/20/2023    1:46 PM 07/12/2022    2:16 PM 07/06/2021    2:25 PM 06/09/2020   12:00 PM 05/08/2019    9:23 AM  6CIT Screen  What Year? 0 points 0 points 0 points 0 points 0 points  What month? 0 points 0 points 0 points 0 points 0 points  What time? 0 points 0 points 0 points  0 points  Count back from 20 0 points 0 points 0 points 0 points 0 points  Months in reverse 0 points 0 points 0 points 0 points 0 points  Repeat phrase 0 points 0 points 0 points 0 points 0 points  Total Score 0 points 0 points 0 points  0 points    Immunizations Immunization History  Administered Date(s) Administered   Fluad Quad(high Dose 65+) 10/28/2020, 10/26/2021, 10/29/2022   H1N1 01/02/2008   Influenza Split 10/14/2010, 10/07/2011   Influenza, High Dose Seasonal PF 11/05/2016, 10/28/2020, 11/09/2022   Influenza,inj,Quad PF,6+ Mos 10/02/2012, 10/16/2015, 11/07/2017, 10/05/2018, 10/30/2019   Influenza-Unspecified 09/25/2013, 10/15/2014, 11/07/2017   Moderna Covid-19 Vaccine Bivalent Booster 37yrs & up 11/14/2020   Moderna Sars-Covid-2 Vaccination 02/09/2019, 03/12/2019, 09/05/2019, 05/12/2020   PFIZER Comirnaty(Gray Top)Covid-19 Tri-Sucrose Vaccine 11/12/2021   PNEUMOCOCCAL CONJUGATE-20 08/27/2020   Pfizer(Comirnaty)Fall Seasonal Vaccine 12 years and older 10/10/2022   Pneumococcal Conjugate-13 08/14/2013   Pneumococcal Polysaccharide-23 10/29/2011   Respiratory Syncytial Virus Vaccine,Recomb Aduvanted(Arexvy) 10/27/2021   Td 05/15/2019   Unspecified SARS-COV-2 Vaccination 10/29/2022   Zoster Recombinant(Shingrix) 06/25/2017, 09/30/2017    Screening Tests Health Maintenance  Topic Date Due   COVID-19 Vaccine (8 - Moderna risk 2024-25 season) 08/04/2023 (Originally 04/29/2023)   INFLUENZA VACCINE  08/26/2023   OPHTHALMOLOGY EXAM  11/19/2023   HEMOGLOBIN A1C  01/18/2024   MAMMOGRAM  04/21/2024   Diabetic kidney evaluation - eGFR measurement  07/18/2024   Diabetic kidney  evaluation - Urine ACR  07/18/2024   FOOT EXAM  07/18/2024   Medicare Annual Wellness (AWV)  07/19/2024   DEXA SCAN  08/08/2024   Colonoscopy  01/02/2026   DTaP/Tdap/Td (2 - Tdap) 05/14/2029   Pneumococcal Vaccine: 50+ Years  Completed   Zoster Vaccines- Shingrix  Completed   Hepatitis B Vaccines  Aged Out   HPV VACCINES  Aged Out   Meningococcal B Vaccine  Aged Out   Hepatitis C Screening  Discontinued    Health Maintenance  There are no preventive care reminders to display for this patient.  Health Maintenance Items Addressed: See Nurse Notes at the end of this note  Additional Screening:  Vision Screening: Recommended annual ophthalmology exams for  early detection of glaucoma and other disorders of the eye. Would you like a referral to an eye doctor? No    Dental Screening: Recommended annual dental exams for proper oral hygiene  Community Resource Referral / Chronic Care Management: CRR required this visit?  No   CCM required this visit?  No   Plan:    I have personally reviewed and noted the following in the patient's chart:   Medical and social history Use of alcohol, tobacco or illicit drugs  Current medications and supplements including opioid prescriptions. Patient is not currently taking opioid prescriptions. Functional ability and status Nutritional status Physical activity Advanced directives List of other physicians Hospitalizations, surgeries, and ER visits in previous 12 months Vitals Screenings to include cognitive, depression, and falls Referrals and appointments  In addition, I have reviewed and discussed with patient certain preventive protocols, quality metrics, and best practice recommendations. A written personalized care plan for preventive services as well as general preventive health recommendations were provided to patient.   Ellouise VEAR Haws, LPN   3/74/7974   After Visit Summary: (MyChart) Due to this being a telephonic visit, the  after visit summary with patients personalized plan was offered to patient via MyChart   Notes: Nothing significant to report at this time.

## 2023-07-26 ENCOUNTER — Other Ambulatory Visit: Payer: Self-pay | Admitting: Family Medicine

## 2023-08-08 ENCOUNTER — Other Ambulatory Visit: Payer: Self-pay | Admitting: Primary Care

## 2023-08-28 ENCOUNTER — Other Ambulatory Visit: Payer: Self-pay | Admitting: Primary Care

## 2023-08-31 ENCOUNTER — Other Ambulatory Visit: Payer: Self-pay | Admitting: Internal Medicine

## 2023-09-07 DIAGNOSIS — D171 Benign lipomatous neoplasm of skin and subcutaneous tissue of trunk: Secondary | ICD-10-CM | POA: Diagnosis not present

## 2023-09-07 DIAGNOSIS — G8929 Other chronic pain: Secondary | ICD-10-CM | POA: Diagnosis not present

## 2023-09-07 DIAGNOSIS — M545 Low back pain, unspecified: Secondary | ICD-10-CM | POA: Diagnosis not present

## 2023-09-26 NOTE — Progress Notes (Unsigned)
 Subjective:    Patient ID: ANAYIA EUGENE, female   DOB: [birth year]    MRN: [id number]   Brief patient profile:  78 yowf MM/ quit smoking 1972 with documented right middle lobe syndrome and evidence of bronchiectasis by CT scan in March 2002  And GOLD 2  criteria for copd 09/2010     History of Present Illness  08/08/07 FOB with classic cobblestoning and MAI on culture.   08/15/07 given Levaquin x 10 days with resolution bloody mucus, but felt she had flu the whole time with aches, feverish   August 29, 2007 ov: first post bronch co still coughing up mucus clear and initiate rx with symbicort / ZMAX  AND [facility] FIRST STARTED    September 06, 2008 ov overall better over the last year, less tendency to exac on zmax  and ethambutol . rec complete another year > satisfied improved 90% and stopped zmax  and eth 08/2009   05/16/17 rec zmax  daily but did not do  11/14/2017  f/u ov/Martie Muhlbauer re: obst bronchiectasis/ confused with details of care / taking zpak prn but also has omnicef  and not sure what to do with it  Chief Complaint  Patient presents with   Follow-up    no current problems   Dyspnea:  yardwork ok / MMRC1 = can walk nl pace, flat grade, can't hurry or go uphills or steps s sob   Cough: variable esp in am / using flutter and vest prn / mucus beige and about a tbsp in am s heme Sleeping:  Bed flat and one big pilow  SABA use: rarely needed  rec Plan A = Automatic = symbicort  160  And take zmax  daily  Plan B = Backup for  breathing Only use your albuterol  as a rescue medication Plan B = for mucus if Nastier than nl especially assoc with any fever >  omnicef  300 mg twice daily x 7days Plan C = Call me if needed and A and B aren't working well   cxr on return        09/08/2022  yearly f/u ov/Nyeli Holtmeyer re: bronchiectasis  maint on dulera  200 2bid  / daily zmax  Chief Complaint  Patient presents with   Follow-up  Dyspnea:  no change / limited by L ankle pain p fell 3 weeks with laceration / just  finished keflex  09/07/22 no better pain on wt bearing  or redness around wound but no fever /chills  Cough: p meals occ minimal pale green no change on keflex   Sleeping: level bed/ 2 pillows  SABA use: rarely hfa  02: none  Rec Try prilosec otc 20mg   Take 30-60 min before first meal of the day and Pepcid  ac (famotidine ) 20 mg one @  bedtime  Please remember to go to the  x-ray department  for your tests - we will call you with the results when they are available    Please schedule a follow up office visit in 4 weeks, sooner if needed - bring inhaler      04/12/2023  f/u ov/Holland Kotter re: obstructive bronchiectasis  maint on dulera  200   Chief Complaint  Patient presents with   Follow-up  Dyspnea:  back doing yardwork with sats 95% RA  Cough: variable vol/ color never bloody / worse in am Sleeping: level bed one pillow  resp cc  SABA use: none  02: none  Rec Try dulera  100 Take 2 puffs first thing in am and then another 2 puffs about 12 hours later  -  if breathing worsens over the next few weeks go back to the 200 strength Also add pepcid   20 mg after breakfast and supper to see if swallowing gets better - if not may need to reconsider Fosamax  per Dr Katrinka     09/27/2023  6 m f/u ov/Calieb Lichtman re: ***   maint on ***  No chief complaint on file.   Dyspnea:  *** Cough: *** Sleeping: *** resp cc  SABA use: *** 02: ***  Lung cancer screening :  ***    No obvious day to day or daytime variability or assoc excess/ purulent sputum or mucus plugs or hemoptysis or cp or chest tightness, subjective wheeze or overt sinus or hb symptoms.    Also denies any obvious fluctuation of symptoms with weather or environmental changes or other aggravating or alleviating factors except as outlined above   No unusual exposure hx or h/o childhood pna/ asthma or knowledge of premature birth.  Current Allergies, Complete Past Medical History, Past Surgical History, Family History, and Social History were  reviewed in [employer] record.  ROS  The following are not active complaints unless bolded Hoarseness, sore throat, dysphagia, dental problems, itching, sneezing,  nasal congestion or discharge of excess mucus or purulent secretions, ear ache,   fever, chills, sweats, unintended wt loss or wt gain, classically pleuritic or exertional cp,  orthopnea pnd or arm/hand swelling  or leg swelling, presyncope, palpitations, abdominal pain, anorexia, nausea, vomiting, diarrhea  or change in bowel habits or change in bladder habits, change in stools or change in urine, dysuria, hematuria,  rash, arthralgias, visual complaints, headache, numbness, weakness or ataxia or problems with walking or coordination,  change in mood or  memory.        No outpatient medications have been marked as taking for the 09/27/23 encounter (Appointment) with Lyfe Monger B, MD.                     Past Medical History:  Bronchiectasis see CT SE 04/13/00  - HFA 75% November 19, 2009  - alpha one screen 01/14/2015 >  MM  - IgE 01/14/2015 = 11  MAI  - Rx Zmax  and ETH 08/29/07 > 08/2009 restarted empirically 05/31/14 > 09/03/14 (no change in cough so just use zpak for flares)  - Rx zmax  maint  10/16/2015 >>> d/c 08/03/2016 > restarted 05/16/17 and clinically improved 08/02/2017 using prn omnicef  to supplment  - Eye eval   10/09.......................[place]AB[place]  - Intol of levaquin so try cycles of cipro April 02, 2010  HEALTH MAINTENANCE...........................SABRA[place]odgin - Td 10/2007  - Pneumovax 2005   and 10/29/2011 age 47, prevnar 08/16/2013  CAD  Hyperlipidemia  Hypertension  History of cough with ACE inhibition.  Gastroesophageal reflux disease         Objective:   Physical Exam  Wts  09/27/2023      ***  04/12/2023   109   09/08/2022  112  08/31/2021    114 08/25/2020    110    02/26/2020   116 08/27/2019    119 02/16/2019  124 08/15/2018  123  Wt 130 October 19, 2007>140 March 31, 2010 > 127 07/16/2010 >  10/14/2010  120 > 05/04/2011  117 > 07/30/2011  120 > 10/29/2011 118 > 130  05/01/2012 > 12/12/2012 132 >  08/14/13 137 >    02/20/2014  137 >  05/31/2014 134 > 09/03/2014    140 > 10/15/2014 137 > 01/14/2015 137 >  05/15/2015 123 >08/14/2015  124 >  10/16/2015 126 > 03/02/2016   124  > 05/04/2016  126 > 08/03/2016   130 > 02/14/2017  126 > 05/16/2017 128 > 08/02/2017  119 > 11/14/2017  116     Vital signs reviewed  09/27/2023  - Note at rest 02 sats  ***% on ***   General appearance:    ***     Mod barr***          Assessment:

## 2023-09-27 ENCOUNTER — Ambulatory Visit: Admitting: Internal Medicine

## 2023-09-27 ENCOUNTER — Ambulatory Visit

## 2023-09-27 ENCOUNTER — Encounter: Payer: Self-pay | Admitting: Internal Medicine

## 2023-09-27 VITALS — BP 116/64 | HR 68 | Temp 98.4°F | Ht 61.0 in | Wt 114.6 lb

## 2023-09-27 DIAGNOSIS — R079 Chest pain, unspecified: Secondary | ICD-10-CM | POA: Diagnosis not present

## 2023-09-27 DIAGNOSIS — R059 Cough, unspecified: Secondary | ICD-10-CM | POA: Diagnosis not present

## 2023-09-27 DIAGNOSIS — A310 Pulmonary mycobacterial infection: Secondary | ICD-10-CM | POA: Diagnosis not present

## 2023-09-27 DIAGNOSIS — J479 Bronchiectasis, uncomplicated: Secondary | ICD-10-CM

## 2023-09-27 MED ORDER — AZITHROMYCIN 250 MG PO TABS: 250.0000 mg | ORAL_TABLET | Freq: Every day | ORAL | 11 refills | Status: AC

## 2023-09-27 MED ORDER — DULERA 200-5 MCG/ACT IN AERO: INHALATION_SPRAY | RESPIRATORY_TRACT | 11 refills | Status: AC

## 2023-09-27 NOTE — Progress Notes (Signed)
 SABRA

## 2023-09-27 NOTE — Assessment & Plan Note (Addendum)
 Onset of symptoms around 2002     - PFT's 10/14/2010  FEV1  1.25 (68%) and ratio 58% and DLCO 90%     - PFT's 10/29/2011  FEV1  1.33 (74%) and ratio 57 % and DLCO 93%    - PFTs 08/14/2013   FEV1  1.16 (60%) and ratio 61 with dlco 83%     - Flutter valve added 09/03/14      - alpha one   01/14/2015 >  MM, level 147     - IgE  01/14/15  11 - CT chest 04/17/15 Marked chronic bronchiectasis and volume loss in the right middle lobe with milder bronchiectasis, bronchial wall thickening, and nodular densities throughout the right upper and right lower lobe suggestive of chr onic endobronchial/atypical mycobacterial infection. - 10/16/2015 changed to symbicort  80 2bid (? Higher doses contributing to w MAI /freq of infections)   - 03/02/2016  After extensive coaching HFA effectiveness =    90%  - 05/04/2016 VEST stared around May 25 2016 - try off zmax  08/03/2016 > no change clinically as of 11/05/2016  - 02/14/2017 cycles of omnicef  x 7 days prn purulent sputum  PFT's  05/16/2017  FEV1 0.89 (47 % ) ratio 60  p 12 % improvement from saba p nothing prior to study   - 05/16/2017  After extensive coaching inhaler device  effectiveness =    90%  - 05/16/17 quant Ig's ok x M slt low (not acutely ill at the time)  -zmax  daily restarted 05/16/17 and clinically improved 08/02/2017 using prn omnicef  to supplment but did not continue zmax  as rec  - restart zmax  daily 11/14/2017 > improved 02/14/2018  - 08/25/2020  After extensive coaching inhaler device,  effectiveness =    95%  > continue symbicort  160 and prn saba - 08/31/2021 rec try dulera  100 to reduce irritation upper airway and increase immune response to potential infections > worse breathing so increased back to 200  bid  - 04/12/2023  After extensive coaching inhaler device,  effectiveness = 80%      > try again to use dulera  100 due to risk of infection  - 09/27/2023 no longer using vest/just flutter   >>>   09/27/2023  After extensive coaching inhaler device,  effectiveness  =    90% and more wheezing on dulera  100 in am / 200 in pm so rec 200 2bid and use arm and hammer for throat irritation and if that doesn't work add spacer.

## 2023-09-27 NOTE — Assessment & Plan Note (Addendum)
 08/08/07 FOB with classic cobblestoning and MAI on culture.  - Rx 08/2007   To 08/2009 with ETH/Zmax  - restarted empirically 05/31/14 > stopped 09/03/14 no benefit perceived, no change on cxr  - restart zmax  daily 03/02/2016 > d/c 08/03/2016 > no change in symptoms or freq exac - -zmax  daily restarted 05/16/17 and clinically improved 08/02/2017 using prn omnicef  to supplment    - flare 09/2018 did not respond to omnicef  but did respond to augmentin  but neither effective against MAI or resistant gnr's so no change in rx needed     Therefore no obvious clinical or radiographic decline, no need to change rx        Each maintenance medication was reviewed in detail including emphasizing most importantly the difference between maintenance and prns and under what circumstances the prns are to be triggered using an action plan format where appropriate.  Total time for H and P, chart review, counseling, reviewing hfa / flutter  device(s) and generating customized AVS unique to this office visit / same day charting = 30 min

## 2023-09-27 NOTE — Patient Instructions (Addendum)
 Change dulera  200 back to Take 2 puffs first thing in am and then another 2 puffs about 12 hours later.   Work on inhaler technique:  relax and gently blow all the way out then take a nice smooth full deep breath back in, triggering the inhaler at same time you start breathing in.  Hold breath in for at least  5 seconds if you can. Blow out duelra 200  thru nose. Rinse and gargle with water when done.  If mouth or throat bother you at all,  try brushing teeth/gums/tongue with arm and hammer toothpaste/ make a slurry and gargle and spit out.      Please remember to go to the  x-ray department  for your tests - we will call you with the results when they are available    Please schedule a follow up visit in 6  months but call sooner if needed

## 2023-09-30 ENCOUNTER — Other Ambulatory Visit: Payer: Self-pay | Admitting: Cardiology

## 2023-10-04 ENCOUNTER — Encounter: Payer: Self-pay | Admitting: Family Medicine

## 2023-10-04 ENCOUNTER — Other Ambulatory Visit: Payer: Self-pay | Admitting: Family Medicine

## 2023-10-04 MED ORDER — COVID-19 MRNA VACC (MODERNA) 50 MCG/0.5ML IM SUSP: 0.5000 mL | Freq: Once | INTRAMUSCULAR | 0 refills | Status: AC

## 2023-10-06 ENCOUNTER — Ambulatory Visit: Payer: Self-pay | Admitting: Internal Medicine

## 2023-10-07 NOTE — Progress Notes (Signed)
 Spoke with pt regarding her cxr , confirmed understanding nfn

## 2023-10-10 DIAGNOSIS — I8311 Varicose veins of right lower extremity with inflammation: Secondary | ICD-10-CM | POA: Diagnosis not present

## 2023-10-10 DIAGNOSIS — L304 Erythema intertrigo: Secondary | ICD-10-CM | POA: Diagnosis not present

## 2023-10-10 DIAGNOSIS — I8312 Varicose veins of left lower extremity with inflammation: Secondary | ICD-10-CM | POA: Diagnosis not present

## 2023-10-10 DIAGNOSIS — L821 Other seborrheic keratosis: Secondary | ICD-10-CM | POA: Diagnosis not present

## 2023-10-10 DIAGNOSIS — I872 Venous insufficiency (chronic) (peripheral): Secondary | ICD-10-CM | POA: Diagnosis not present

## 2023-10-10 DIAGNOSIS — Z85828 Personal history of other malignant neoplasm of skin: Secondary | ICD-10-CM | POA: Diagnosis not present

## 2023-10-18 ENCOUNTER — Other Ambulatory Visit: Payer: Self-pay

## 2023-10-18 MED ORDER — NEBIVOLOL HCL 2.5 MG PO TABS: ORAL_TABLET | ORAL | 2 refills | Status: AC

## 2023-11-09 ENCOUNTER — Telehealth: Payer: Self-pay | Admitting: Family Medicine

## 2023-11-09 ENCOUNTER — Encounter (HOSPITAL_BASED_OUTPATIENT_CLINIC_OR_DEPARTMENT_OTHER): Payer: Self-pay | Admitting: *Deleted

## 2023-11-09 ENCOUNTER — Other Ambulatory Visit: Payer: Self-pay

## 2023-11-09 ENCOUNTER — Ambulatory Visit: Payer: Self-pay | Admitting: *Deleted

## 2023-11-09 ENCOUNTER — Emergency Department (HOSPITAL_BASED_OUTPATIENT_CLINIC_OR_DEPARTMENT_OTHER)
Admission: EM | Admit: 2023-11-09 | Discharge: 2023-11-09 | Disposition: A | Discharge status: Home / Self Care | Source: Ambulatory Visit | Attending: Emergency Medicine | Admitting: Emergency Medicine

## 2023-11-09 ENCOUNTER — Emergency Department (HOSPITAL_BASED_OUTPATIENT_CLINIC_OR_DEPARTMENT_OTHER)

## 2023-11-09 DIAGNOSIS — I672 Cerebral atherosclerosis: Secondary | ICD-10-CM | POA: Diagnosis not present

## 2023-11-09 DIAGNOSIS — Z7984 Long term (current) use of oral hypoglycemic drugs: Secondary | ICD-10-CM | POA: Insufficient documentation

## 2023-11-09 DIAGNOSIS — I10 Essential (primary) hypertension: Secondary | ICD-10-CM | POA: Diagnosis not present

## 2023-11-09 DIAGNOSIS — R42 Dizziness and giddiness: Secondary | ICD-10-CM | POA: Diagnosis not present

## 2023-11-09 DIAGNOSIS — J449 Chronic obstructive pulmonary disease, unspecified: Secondary | ICD-10-CM | POA: Insufficient documentation

## 2023-11-09 DIAGNOSIS — I6789 Other cerebrovascular disease: Secondary | ICD-10-CM | POA: Diagnosis not present

## 2023-11-09 DIAGNOSIS — I2510 Atherosclerotic heart disease of native coronary artery without angina pectoris: Secondary | ICD-10-CM | POA: Diagnosis not present

## 2023-11-09 DIAGNOSIS — H538 Other visual disturbances: Secondary | ICD-10-CM | POA: Insufficient documentation

## 2023-11-09 DIAGNOSIS — Z7982 Long term (current) use of aspirin: Secondary | ICD-10-CM | POA: Diagnosis not present

## 2023-11-09 DIAGNOSIS — R531 Weakness: Secondary | ICD-10-CM | POA: Insufficient documentation

## 2023-11-09 DIAGNOSIS — R519 Headache, unspecified: Secondary | ICD-10-CM | POA: Diagnosis not present

## 2023-11-09 DIAGNOSIS — Z79899 Other long term (current) drug therapy: Secondary | ICD-10-CM | POA: Diagnosis not present

## 2023-11-09 DIAGNOSIS — I6782 Cerebral ischemia: Secondary | ICD-10-CM | POA: Diagnosis not present

## 2023-11-09 DIAGNOSIS — E119 Type 2 diabetes mellitus without complications: Secondary | ICD-10-CM | POA: Insufficient documentation

## 2023-11-09 DIAGNOSIS — H539 Unspecified visual disturbance: Secondary | ICD-10-CM | POA: Diagnosis present

## 2023-11-09 LAB — COMPREHENSIVE METABOLIC PANEL WITH GFR
ALT: 23 U/L (ref 0–44)
AST: 35 U/L (ref 15–41)
Albumin: 4.7 g/dL (ref 3.5–5.0)
Alkaline Phosphatase: 95 U/L (ref 38–126)
Anion gap: 12 (ref 5–15)
BUN: 16 mg/dL (ref 8–23)
CO2: 26 mmol/L (ref 22–32)
Calcium: 9.9 mg/dL (ref 8.9–10.3)
Chloride: 99 mmol/L (ref 98–111)
Creatinine, Ser: 0.88 mg/dL (ref 0.44–1.00)
GFR, Estimated: >60 mL/min (ref >60)
Glucose, Bld: 87 mg/dL (ref 70–99)
Potassium: 3.9 mmol/L (ref 3.5–5.1)
Sodium: 138 mmol/L (ref 135–145)
Total Bilirubin: 0.3 mg/dL (ref 0.0–1.2)
Total Protein: 7.6 g/dL (ref 6.5–8.1)

## 2023-11-09 LAB — CBC WITH DIFFERENTIAL/PLATELET
Abs Immature Granulocytes: 0.01 K/uL (ref 0.00–0.07)
Basophils Absolute: 0 K/uL (ref 0.0–0.1)
Basophils Relative: 1 %
Eosinophils Absolute: 0 K/uL (ref 0.0–0.5)
Eosinophils Relative: 0 %
HCT: 39.2 % (ref 36.0–46.0)
Hemoglobin: 13.1 g/dL (ref 12.0–15.0)
Immature Granulocytes: 0 %
Lymphocytes Relative: 21 %
Lymphs Abs: 1.6 K/uL (ref 0.7–4.0)
MCH: 31.2 pg (ref 26.0–34.0)
MCHC: 33.4 g/dL (ref 30.0–36.0)
MCV: 93.3 fL (ref 80.0–100.0)
Monocytes Absolute: 0.5 K/uL (ref 0.1–1.0)
Monocytes Relative: 7 %
Neutro Abs: 5.5 K/uL (ref 1.7–7.7)
Neutrophils Relative %: 71 %
Platelets: 307 K/uL (ref 150–400)
RBC: 4.2 MIL/uL (ref 3.87–5.11)
RDW: 13.3 % (ref 11.5–15.5)
WBC: 7.7 K/uL (ref 4.0–10.5)
nRBC: 0 % (ref 0.0–0.2)

## 2023-11-09 LAB — PROTIME-INR
INR: 0.9 (ref 0.8–1.2)
Prothrombin Time: 12.1 s (ref 11.4–15.2)

## 2023-11-09 LAB — CBG MONITORING, ED: Glucose-Capillary: 83 mg/dL (ref 70–99)

## 2023-11-09 LAB — APTT: aPTT: 27 s (ref 24–36)

## 2023-11-09 MED ORDER — IOHEXOL 350 MG/ML SOLN
75.0000 mL | Freq: Once | INTRAVENOUS | Status: AC | PRN
  Administered 2023-11-09: 75 mL via INTRAVENOUS

## 2023-11-09 NOTE — Discharge Instructions (Signed)
 As we discussed, your test today were reassuring.  The CT scan did not show any acute abnormality but it is still possible to have a small stroke that does not show up on the CT scan.  An MRI is more sensitive.  We are not able to do that at this facility.  If you change your mind about having that done, please go to [facility] and especially do so if you have worsening symptoms.  Otherwise follow-up with your primary care doctor as planned.

## 2023-11-09 NOTE — ED Triage Notes (Addendum)
 Pt to ED with weakness, blurred vision and shakiness x 1 week. No NVD or fevers. Intermittent dizziness and headaches also reported. Hx of DM, patient unsure what blood sugars have been at home.

## 2023-11-09 NOTE — Telephone Encounter (Signed)
 Called and spoke with patient. She was requesting to come here to get lab work done but with the symptoms she is having I really stressed she needs to be seen today, we have no openings with anyone here in our office and she asked about Friday, I told her she really does not need to wait that long and she needs to be evaluated. I told her if she does not want to to go the ED she can go to Urgent Care. Patient inquired about [facility] and I explained the set up to her. She will be presenting to ER at [facility] today.

## 2023-11-09 NOTE — Telephone Encounter (Signed)
 FYI Only or Action Required?: FYI only for provider.  Patient was last seen in primary care on 07/19/2023 by Katrinka Garnette KIDD, MD.  Called Nurse Triage reporting Dizziness.  Symptoms began a week ago.  Interventions attempted: Rest, hydration, or home remedies.  Symptoms are: unchanged.  Triage Disposition: Go to ED Now (Notify PCP)  Patient/caregiver understands and will follow disposition?: No, wishes to speak with PCP Patient declines ED- she wants to see PCP first- feels she may need labs- advised will send request for appointment- but recommendation for her symptoms is ED.    Reason for Disposition  Loss of vision or double vision  (Exception: Similar to previous migraines.)  Answer Assessment - Initial Assessment Questions 1. DESCRIPTION: Describe your dizziness.     Shaky, double vision, weak 2. LIGHTHEADED: Do you feel lightheaded? (e.g., somewhat faint, woozy, weak upon standing)     weakness 3. VERTIGO: Do you feel like either you or the room is spinning or tilting? (i.e., vertigo)     no 4. SEVERITY: How bad is it?  Do you feel like you are going to faint? Can you stand and walk?     Yes- being careful- sometimes feels like she may go sideways 5. ONSET:  When did the dizziness begin?     1 week- no change  6. AGGRAVATING FACTORS: Does anything make it worse? (e.g., standing, change in head position)     No- patient was at grocery today- had to close one eye to drive 7. HEART RATE: Can you tell me your heart rate? How many beats in 15 seconds?  (Note: Not all patients can do this.)       Pulse 80-82 8. CAUSE: What do you think is causing the dizziness? (e.g., decreased fluids or food, diarrhea, emotional distress, heat exposure, new medicine, sudden standing, vomiting; unknown)     unknown 9. RECURRENT SYMPTOM: Have you had dizziness before? If Yes, ask: When was the last time? What happened that time?     Patient has vertigo hx- not like  this 10. OTHER SYMPTOMS: Do you have any other symptoms? (e.g., fever, chest pain, vomiting, diarrhea, bleeding)       no  Protocols used: Dizziness - Lightheadedness-A-AH   Copied from CRM #[id number]. Topic: Clinical - Red Word Triage >> Nov 09, 2023  3:28 PM Mesmerise C wrote: Red Word that prompted transfer to Nurse Triage: Patient has been dizzy, lightheaded, double vision, low 116/63 BP been ongoing for a week now, wanting to get into a appt next week

## 2023-11-09 NOTE — ED Notes (Signed)
 Patient transported to CT

## 2023-11-09 NOTE — ED Notes (Signed)
 Reviewed discharge instructions and follow-up care with pt. Pt verbalized understanding and had no further questions. Pt exited ED without complications.

## 2023-11-09 NOTE — Telephone Encounter (Signed)
 Please see patient message and advise. Refusing ED and requesting you to do lab work.

## 2023-11-09 NOTE — ED Notes (Signed)
 ED Provider at bedside.

## 2023-11-09 NOTE — ED Provider Notes (Signed)
 [place] EMERGENCY DEPARTMENT AT [facility] Provider Note   CSN: [id number] Arrival date & time: 11/09/23  [id number]     Patient presents with: Weakness   Belinda Day is a 78 y.o. female.    Weakness    Patient has a history of bronchiectasis coronary artery disease, hyperlipidemia, hypertension, acid reflux, diabetes, COPD who presents to the ED with complaints of dizziness blurred vision.  Patient states her symptoms have been ongoing for the last week.  She has been feeling weak.  She has been feeling shaky.  She feels like she is having blurred vision and double vision.  She feels off balance patient states nothing seems to particular trigger it.  It does occur when she is lying still.  She denies any vomiting diarrhea.  No fevers.  No severe headaches.  Symptoms have been getting worse especially in the last day or so.  She called her doctor and was told to come to the ED  Prior to Admission medications   Medication Sig Start Date End Date Taking? Authorizing Provider  albuterol  (PROAIR  HFA) 108 (90 Base) MCG/ACT inhaler Inhale 2 puffs into the lungs every 6 (six) hours as needed for wheezing or shortness of breath. 08/31/21   Darlean Ozell NOVAK, MD  alendronate  (FOSAMAX ) 70 MG tablet Take 1 tablet (70 mg total) by mouth every 7 (seven) days. Take with a full glass of water on an empty stomach. 01/06/23   Katrinka Garnette KIDD, MD  ALPRAZolam  (XANAX ) 0.25 MG tablet TAKE 1 TABLET BY MOUTH AT BEDTIME AS NEEDED FOR SLEEP. TAKE SPARINGLY FOR DAYTIME ANXIETY. 8 HOURS BETWEEN DOSES. DO NOT DRIVE FOR 8 HOURS 03/31/72   Katrinka Garnette KIDD, MD  amitriptyline  (ELAVIL ) 50 MG tablet TAKE 1 TABLET(50 MG) BY MOUTH AT BEDTIME 05/17/23   Katrinka Garnette KIDD, MD  amLODipine  (NORVASC ) 5 MG tablet TAKE 1 TABLET(5 MG) BY MOUTH DAILY 09/30/23   Pietro Redell RAMAN, MD  aspirin  EC 81 MG tablet Take 81 mg by mouth daily.    [provider]  atorvastatin  (LIPITOR ) 80 MG tablet TAKE 1 TABLET(80 MG) BY  MOUTH DAILY 04/11/23   Pietro Redell RAMAN, MD  azithromycin  (ZITHROMAX ) 250 MG tablet Take 1 tablet (250 mg total) by mouth daily. 09/27/23   Darlean Ozell NOVAK, MD  calcium  carbonate (OS-CAL - DOSED IN MG OF ELEMENTAL CALCIUM ) 1250 (500 Ca) MG tablet Take 1 tablet by mouth daily.    [provider]  cholecalciferol (VITAMIN D3) 25 MCG (1000 UNIT) tablet Take 1,000 Units by mouth daily.    [provider]  Coenzyme Q10 (COQ10) 200 MG CAPS Take 200 mg by mouth daily.    [provider]  cyanocobalamin  (VITAMIN B12) 1000 MCG tablet Take 1,000 mcg by mouth daily.    [provider]  Melatonin 5 MG TABS Take 5 mg by mouth at bedtime.    [provider]  metFORMIN  (GLUCOPHAGE ) 500 MG tablet TAKE 1 TABLET(500 MG) BY MOUTH TWICE DAILY WITH A MEAL 05/17/23   Katrinka Garnette KIDD, MD  mometasone -formoterol  (DULERA ) 200-5 MCG/ACT AERO Take 2 puffs first thing in am and then another 2 puffs about 12 hours later. 09/27/23   Darlean Ozell NOVAK, MD  Multiple Vitamin (MULTIVITAMIN) tablet Take 1 tablet by mouth daily.    [provider]  nebivolol  (BYSTOLIC ) 2.5 MG tablet TAKE 1 TABLET(2.5 MG) BY MOUTH DAILY 10/18/23   Rana Lum CROME, NP  Respiratory Therapy Supplies (FLUTTER) DEVI Use as directed 09/03/14  Darlean Ozell NOVAK, MD  SYNTHROID  75 MCG tablet TAKE 1 TABLET BY MOUTH DAILY BEFORE BREAKFAST 07/26/23   Katrinka Garnette KIDD, MD  telmisartan  (MICARDIS ) 80 MG tablet TAKE 1 TABLET(80 MG) BY MOUTH DAILY Patient taking differently: Take 80 mg by mouth daily. 11/11/22   Pietro Redell RAMAN, MD    Allergies: Ciprofloxacin, Codeine, Erythromycin, Levofloxacin, Tramadol, and Sulfamethoxazole -trimethoprim    Review of Systems  Neurological:  Positive for weakness.    Updated Vital Signs BP 125/60   Pulse 66   Temp 98.7 F (37.1 C) (Oral)   Resp 14   SpO2 96%   Physical Exam Vitals and nursing note reviewed.  Constitutional:      General: She is not in acute distress.     Appearance: She is well-developed.  HENT:     Head: Normocephalic and atraumatic.     Right Ear: External ear normal.     Left Ear: External ear normal.  Eyes:     General: No visual field deficit or scleral icterus.       Right eye: No discharge.        Left eye: No discharge.     Conjunctiva/sclera: Conjunctivae normal.  Neck:     Trachea: No tracheal deviation.  Cardiovascular:     Rate and Rhythm: Normal rate and regular rhythm.  Pulmonary:     Effort: Pulmonary effort is normal. No respiratory distress.     Breath sounds: Normal breath sounds. No stridor. No wheezing or rales.  Abdominal:     General: Bowel sounds are normal. There is no distension.     Palpations: Abdomen is soft.     Tenderness: There is no abdominal tenderness. There is no guarding or rebound.  Musculoskeletal:        General: No tenderness.     Cervical back: Neck supple.  Skin:    General: Skin is warm and dry.     Findings: No rash.  Neurological:     Mental Status: She is alert and oriented to person, place, and time.     Cranial Nerves: No cranial nerve deficit, dysarthria or facial asymmetry.     Sensory: No sensory deficit.     Motor: No abnormal muscle tone, seizure activity or pronator drift.     Coordination: Coordination normal.     Comments:  able to hold both legs off bed for 5 seconds, sensation intact in all extremities,  no left or right sided neglect, normal finger-nose exam bilaterally, no nystagmus noted   Psychiatric:        Mood and Affect: Mood normal.     (all labs ordered are listed, but only abnormal results are displayed) Labs Reviewed  COMPREHENSIVE METABOLIC PANEL WITH GFR  PROTIME-INR  APTT  CBC WITH DIFFERENTIAL/PLATELET  CBG MONITORING, ED    EKG: EKG Interpretation Date/Time:  Wednesday November 09 2023 16:46:01 EDT Ventricular Rate:  79 PR Interval:  108 QRS Duration:  101 QT Interval:  380 QTC Calculation: 436 R Axis:   73  Text Interpretation: Sinus  rhythm Short PR interval No significant change since last tracing Confirmed by Randol Simmonds [phone]) on 11/09/2023 5:02:20 PM  Radiology: CT ANGIO HEAD NECK W WO CM Result Date: 11/09/2023 CLINICAL DATA:  Neuro deficit, acute, stroke suspected. Weakness, blurred vision, dizziness, and headache. EXAM: CT ANGIOGRAPHY HEAD AND NECK WITH AND WITHOUT CONTRAST TECHNIQUE: Multidetector CT imaging of the head and neck was performed using the standard protocol during bolus administration of intravenous contrast.  Multiplanar CT image reconstructions and MIPs were obtained to evaluate the vascular anatomy. Carotid stenosis measurements (when applicable) are obtained utilizing NASCET criteria, using the distal internal carotid diameter as the denominator. RADIATION DOSE REDUCTION: This exam was performed according to the departmental dose-optimization program which includes automated exposure control, adjustment of the mA and/or kV according to patient size and/or use of iterative reconstruction technique. CONTRAST:  75mL OMNIPAQUE  IOHEXOL  350 MG/ML SOLN COMPARISON:  None Available. FINDINGS: CT HEAD FINDINGS Brain: There is no evidence of an acute infarct, intracranial hemorrhage, mass, midline shift, or extra-axial fluid collection. There is mild cerebral atrophy. Cerebral white matter hypodensities are nonspecific but compatible with mild chronic small vessel ischemic disease. Vascular: Calcified atherosclerosis at the skull base. Skull: No fracture or suspicious lesion. Sinuses/Orbits: Paranasal sinuses and mastoid air cells are clear. Unremarkable orbits. Other: None. Review of the MIP images confirms the above findings CTA NECK FINDINGS Aortic arch: Incompletely imaged, including exclusion of the origins of the brachiocephalic and left common carotid arteries. The included portions of the brachiocephalic and subclavian arteries are patent without evidence of a significant stenosis. Right carotid system: Patent with a  small to moderate amount of calcified plaque at the carotid bifurcation. No evidence of a significant stenosis or dissection. Left carotid system: Patent with a small to moderate amount of calcified plaque at the carotid bifurcation. No evidence of a significant stenosis or dissection. Vertebral arteries: Patent with the left being dominant. No evidence of a dissection, stenosis, or significant atherosclerosis. Skeleton: Advanced cervical facet arthrosis with mild multilevel listhesis. Relatively mild cervical disc degeneration. Other neck: No evidence of cervical lymphadenopathy or mass. Upper chest: Motion artifact through the lung apices with partially visualized apical lung scarring, bronchial wall thickening, and bronchiectasis as well as a 1.7 cm chronic confluent density in the medial right lung apex which is similar to a 2017 chest CTA and likely reflective of scarring. Review of the MIP images confirms the above findings CTA HEAD FINDINGS Anterior circulation: The internal carotid arteries are patent from skull base to carotid termini with mild atherosclerotic calcification bilaterally not resulting in significant stenosis. ACAs and MCAs are patent without evidence of a proximal branch occlusion or significant proximal stenosis. No aneurysm is identified. Posterior circulation: The intracranial vertebral arteries are widely patent to the basilar. Patent PICA and SCA origins are visualized bilaterally. The basilar artery is widely patent. There are large left and diminutive or absent right posterior communicating arteries with hypoplasia of the left P1 segment. Both PCAs are patent without evidence of a significant proximal stenosis. No aneurysm is identified. Venous sinuses: As permitted by contrast timing, patent. Anatomic variants: Prominent fetal supply of the left PCA. Dominant right and hypoplastic left ACAs with azygous A2 configuration. Review of the MIP images confirms the above findings IMPRESSION:  1. No evidence of acute intracranial abnormality. Mild chronic small vessel ischemic disease in the cerebral white matter. 2. Mild atherosclerosis in the head and neck without a large vessel occlusion or significant proximal stenosis. Electronically Signed   By: Dasie Hamburg M.D.   On: 11/09/2023 18:36     Procedures   Medications Ordered in the ED  iohexol  (OMNIPAQUE ) 350 MG/ML injection 75 mL (75 mLs Intravenous Contrast Given 11/09/23 1745)    Clinical Course as of 11/09/23 1903  Wed Nov 09, 2023  1715 CBC with Differential/Platelet Normal [JK]  1731 Comprehensive metabolic panel Metabolic panel normal [JK]  1849 Head CT without acute abnormality.  Mild atherosclerosis  without signs of large vessel occlusion [JK]    Clinical Course User Index [JK] Randol Simmonds, MD                                 Medical Decision Making Problems Addressed: Weakness: acute illness or injury that poses a threat to life or bodily functions  Amount and/or Complexity of Data Reviewed Labs: ordered. Decision-making details documented in ED Course. Radiology: ordered and independent interpretation performed.  Risk Prescription drug management.   Patient presented to the ED with complaints of feeling weak shaky and some blurred vision ongoing for the last week.  Considered the possibility of anemia electrolyte disturbance acute stroke.  Patient's ED workup is reassuring.  She is not anemic.  There is no electrolyte abnormality.  Her CT angiogram of her head and neck does not show any acute abnormality.  No signs of stroke or hemorrhage.  Patient does not have any focal deficits on exam but it is still possible for her to have an occult stroke that is causing this dizziness for her.  Symptoms have been ongoing for over 1 week.  I discussed transfer to [facility] to have that MRI done today as it is not available at this facility.  Patient declines.  She would like to follow-up with her primary care  doctor.  Patient understands to return to [facility] if she changes her mind or has any worsening symptoms.     Final diagnoses:  Weakness    ED Discharge Orders     None          Randol Simmonds, MD 11/09/23 [phone]

## 2023-11-10 NOTE — Telephone Encounter (Signed)
 Patient was seen in ER yesterday and discharged. Will follow up on the 20th.

## 2023-11-10 NOTE — Telephone Encounter (Signed)
 I think this is great advice with the double vision in particular

## 2023-11-10 NOTE — Telephone Encounter (Signed)
 I'm willing to see her on the 20th as scheduled or ASAP if other openings BUT I still think Emergency Department is preferred/recommended strongly with double vision in particular (high risk symptom). If she covers one eye and blurry vision goes away or the opposite eye and it goes away needs to see eye doctor ASAP.

## 2023-11-14 ENCOUNTER — Other Ambulatory Visit: Payer: Self-pay

## 2023-11-14 ENCOUNTER — Ambulatory Visit: Admitting: Family Medicine

## 2023-11-14 ENCOUNTER — Ambulatory Visit: Payer: Self-pay | Admitting: Family Medicine

## 2023-11-14 ENCOUNTER — Encounter (HOSPITAL_COMMUNITY): Payer: Self-pay

## 2023-11-14 ENCOUNTER — Encounter: Payer: Self-pay | Admitting: Family Medicine

## 2023-11-14 VITALS — BP 122/60 | HR 62 | Temp 96.8°F | Ht 61.0 in | Wt 113.0 lb

## 2023-11-14 DIAGNOSIS — E039 Hypothyroidism, unspecified: Secondary | ICD-10-CM

## 2023-11-14 DIAGNOSIS — H532 Diplopia: Secondary | ICD-10-CM

## 2023-11-14 DIAGNOSIS — R42 Dizziness and giddiness: Secondary | ICD-10-CM

## 2023-11-14 DIAGNOSIS — I10 Essential (primary) hypertension: Secondary | ICD-10-CM

## 2023-11-14 DIAGNOSIS — R2689 Other abnormalities of gait and mobility: Secondary | ICD-10-CM

## 2023-11-14 DIAGNOSIS — D649 Anemia, unspecified: Secondary | ICD-10-CM

## 2023-11-14 DIAGNOSIS — E119 Type 2 diabetes mellitus without complications: Secondary | ICD-10-CM | POA: Diagnosis not present

## 2023-11-14 DIAGNOSIS — Z7984 Long term (current) use of oral hypoglycemic drugs: Secondary | ICD-10-CM | POA: Diagnosis not present

## 2023-11-14 LAB — TSH: TSH: 1.08 u[IU]/mL (ref 0.35–5.50)

## 2023-11-14 LAB — IBC + FERRITIN
Ferritin: 11.6 ng/mL (ref 10.0–291.0)
Iron: 89 ug/dL (ref 42–145)
Saturation Ratios: 19.4 % — ABNORMAL LOW (ref 20.0–50.0)
TIBC: 457.8 ug/dL — ABNORMAL HIGH (ref 250.0–450.0)
Transferrin: 327 mg/dL (ref 212.0–360.0)

## 2023-11-14 LAB — VITAMIN B12: Vitamin B-12: 721 pg/mL (ref 211–911)

## 2023-11-14 LAB — HEMOGLOBIN A1C: Hgb A1c MFr Bld: 6.4 % (ref 4.6–6.5)

## 2023-11-14 MED ORDER — TELMISARTAN 80 MG PO TABS: ORAL_TABLET | ORAL | 1 refills | Status: AC

## 2023-11-14 NOTE — Patient Instructions (Addendum)
 Please stop by lab before you go If you have mychart- we will send your results within 3 business days of us  receiving them.  If you do not have mychart- we will call you about results within 5 business days of us  receiving them.  *please also note that you will see labs on mychart as soon as they post. I will later go in and write notes on them- will say notes from Dr. Katrinka   Urgent MRI- hoping to get be  Recommended follow up: Return for next already scheduled visit or sooner if needed. BUT I need to stay in the loop with how you are feeling- if you still feel bad after MRI and eye doctor visit we need to dsicuss next steps

## 2023-11-14 NOTE — Progress Notes (Signed)
 Phone [phone] In person visit   Subjective:   Belinda Day is a 78 y.o. year old very pleasant female patient who presents for/with See problem oriented charting Chief Complaint  Patient presents with   Hospitalization Follow-up    Patient is still dizzy and has ringing in her ears; she feels unbalanced; headaches but tylenol  helps with them daily;     Past Medical History-  Patient Active Problem List   Diagnosis Date Noted   Type 2 diabetes mellitus without complication, without long-term current use of insulin  (HCC) 09/07/2017    Priority: High   Osteoporosis 08/14/2011    Priority: High   CAD (coronary artery disease) s/p CABG 07/03/2007    Priority: High   Aortic atherosclerosis 05/28/2020    Priority: Medium    Hyperglycemia 08/05/2014    Priority: Medium    Insomnia 02/12/2013    Priority: Medium    Nocturnal hypoxemia 12/12/2012    Priority: Medium    Fibromyalgia 12/28/2011    Priority: Medium    Hypothyroidism 04/06/2010    Priority: Medium    MAI (mycobacterium avium-intracellulare) (HCC) 11/19/2009    Priority: Medium    Hyperlipemia 07/03/2007    Priority: Medium    Essential hypertension 07/03/2007    Priority: Medium    Obstructive bronchiectasis (HCC) with GOLD II/III criteria 07/03/2007    Priority: Medium    Cystitis 11/05/2016    Priority: Low   Former smoker 08/05/2014    Priority: Low   Constipation 05/02/2014    Priority: Low   History of colonic polyps 05/02/2014    Priority: Low   Hemoptysis 02/24/2014    Priority: Low   Benign paroxysmal positional vertigo 04/18/2013    Priority: Low   Diverticulitis 12/21/2012    Priority: Low   sepsis secondary to Pyelonephritis 03/26/2023   Sepsis (HCC) 03/26/2023   Hyponatremia 03/26/2023   Medication management 10/21/2022   Cellulitis of left lower extremity 10/13/2022   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 1 each 2   alendronate  (FOSAMAX ) 70 MG tablet Take 1 tablet (70 mg total) by mouth every 7 (seven) days. Take with a full glass of water on an empty stomach. 5 tablet 11   ALPRAZolam  (XANAX ) 0.25 MG tablet TAKE 1 TABLET BY MOUTH AT BEDTIME AS NEEDED FOR SLEEP. TAKE SPARINGLY FOR DAYTIME ANXIETY. 8 HOURS BETWEEN DOSES. DO NOT DRIVE FOR 8 HOURS 95 tablet 1   amitriptyline  (ELAVIL ) 50 MG tablet TAKE 1 TABLET(50 MG) BY MOUTH AT BEDTIME 90 tablet 3   amLODipine  (NORVASC ) 5 MG tablet TAKE 1 TABLET(5 MG) BY MOUTH DAILY 90 tablet 3   aspirin  EC 81 MG tablet Take 81 mg by mouth daily.     atorvastatin  (LIPITOR ) 80 MG tablet TAKE 1 TABLET(80 MG) BY MOUTH DAILY 90 tablet 3   azithromycin  (ZITHROMAX ) 250 MG tablet Take 1 tablet (250 mg total) by mouth daily. 30 each 11   calcium  carbonate (OS-CAL - DOSED IN MG OF ELEMENTAL CALCIUM ) 1250 (500 Ca) MG tablet Take 1 tablet by mouth daily.     cholecalciferol (VITAMIN D3) 25  MCG (1000 UNIT) tablet Take 1,000 Units by mouth daily.     Coenzyme Q10 (COQ10) 200 MG CAPS Take 200 mg by mouth daily.     cyanocobalamin  (VITAMIN B12) 1000 MCG tablet Take 1,000 mcg by mouth daily.     Melatonin 5 MG TABS Take 5 mg by mouth at bedtime.     metFORMIN  (GLUCOPHAGE ) 500 MG tablet TAKE 1 TABLET(500 MG) BY MOUTH TWICE DAILY WITH A MEAL 180 tablet 3   mometasone -formoterol  (DULERA ) 200-5 MCG/ACT AERO Take 2 puffs first thing in am and then another 2 puffs about 12 hours later. 1 each 11   Multiple Vitamin (MULTIVITAMIN) tablet Take 1 tablet by mouth daily.     nebivolol  (BYSTOLIC ) 2.5 MG tablet TAKE 1 TABLET(2.5 MG) BY MOUTH DAILY 90  tablet 2   Respiratory Therapy Supplies (FLUTTER) DEVI Use as directed 1 each 0   SYNTHROID  75 MCG tablet TAKE 1 TABLET BY MOUTH DAILY BEFORE BREAKFAST 90 tablet 3   telmisartan  (MICARDIS ) 80 MG tablet TAKE 1 TABLET(80 MG) BY MOUTH DAILY (Patient taking differently: Take 80 mg by mouth daily.) 90 tablet 3   No current facility-administered medications for this visit.     Objective:  BP 122/60 (BP Location: Left Arm, Patient Position: Sitting, Cuff Size: Normal)   Pulse 62   Temp (!) 96.8 F (36 C) (Temporal)   Ht 5' 1 (1.549 m)   Wt 113 lb (51.3 kg)   SpO2 96%   BMI 21.35 kg/m  Gen: NAD, resting comfortably CV: RRR no murmurs rubs or gallops Lungs: CTAB no crackles, wheeze, rhonchi Abdomen: soft/nontender/nondistended/normal bowel sounds. No rebound or guarding.  Ext: no edema Skin: warm, dry  Neuro: CN II-XII intact other than when I hold fingers up at a distance with both eyes open she sees 3 fingers when I hold two which resolves with closing either eye, sensation and reflexes normal throughout, 5/5 muscle strength in bilateral upper and lower extremities.  Tremulous with  finger to nose.  Tremulous but no pronator drift.  Much more cautious gait than her baseline.      Assessment and Plan   # Emergency department follow-up for blurry vision/double vision/dizziness S: Patient was seen emergency department 11/09/2023 with complaints of dizziness and blurred vision.  They have been going on for about a week.  She been feeling generally weak as well.  Reported both blurred and double vision.  She felt off balance and nothing with no particular triggers. But also nothing seemed to help as it also occurred while lying still at times.  no vomiting or diarrhea or fevers or headaches reported at that time.   Workup included - Reassuring EKG with sinus rhythm short PR interval but unchanged from baseline -CT angiogram of the head and neck showed no acute abnormality with no evidence of  stroke or hemorrhage  - No anemia on CBC.  No electrolyte abnormalities on CMP  They did discuss that she could have had an occult stroke causing her dizziness and gave option of transfer to [facility] for MRI of the brain but she preferred to follow-up with primary care  She reports to me in addition to the above she has felt shaky and unable to walk in a straight line due to imbalance. She has actually noted some mnior headaches but tylenol  helps. She has also noted ringing in the ears that is new. No hearing loss.   When she closes one eye the blurry vision persists but improves  but does not see double such as when I held up 2 fingers- she felt like she saw 3 fingers with both eyes but with just one eye saw 2. She has had vertigo in the past and this does not feel like that. Mainly imbalanced.   Tremor is with action  No palpitations or chest pain  A/P: 78 year old female with over a week of blurry vision/double vision/dizziness/headaches also associated with some tinnitus with reassuring workup so far including EKG, CT angiogram, CBC, CMP.  Emergency department provider noted she may benefit from MRI of the brain to rule out small or stroke in regards to dizziness-I think that is a good idea and I have ordered this as urgent - Blood pressure appears well-controlled and blood sugar was normal - Patient is also more tremulous than baseline-prominent tremor with intention noted - Also want her to see her eye doctor to rule out retinal issues but with it being binocular double vision I think that is less likely to give us  answers - Also considering neurological consult outpatient - Another thought I had was potentially reducing amitriptyline  but it would be odd for this to suddenly be affecting her like this -She reports knowing what vertigo feels like and this feels far different but did consider Mnire's but that would not explain double vision-once again I think neurology would be the best next  step     % #CAD status post CABG-follows with Dr. Chari S: Compliant with atorvastatin  80 mg and aspirin  81 mg.  LDL goal under 70 A/P: Patient plans to schedule follow-up with Dr. Pietro.  We do not think her dizziness is related to her heart.  We also went over reassuring carotid findings but still certainly reasonable to see him for his opinion   #Hypertension S: Compliant with Bystolic  2.5 mg, telmisartan  80 mg, amlodipine  5 mg A/P: Blood pressure well-controlled and since  symptoms are not primarily positional I am not going to make any changes  #Hypothyroidism S: Compliant with Synthroid  75 mcg  Lab Results  Component Value Date   TSH 1.90 07/19/2023  A/P: Has been well-controlled-update given recent symptoms   #Diabetes-new diagnosis 12/02/2021 with A1c 6.6 already on metformin  S: Medication:  Metformin  500 mg twice a day.  Lab Results  Component Value Date   HGBA1C 6.4 07/19/2023   HGBA1C 6.2 01/06/2023   HGBA1C 5.9 06/30/2022  A/P: Well-controlled in the past-update A1c with labs today but do not think her issues are blood sugar related  Recommended follow up: Return for next already scheduled visit or sooner if needed. Future Appointments  Date Time Provider Department Center  01/16/2024  8:20 AM Katrinka Garnette KIDD, MD [facility]  07/20/2024  8:20 AM Katrinka Garnette KIDD, MD [facility]    Lab/Order associations:   ICD-10-CM   1. Imbalance  R26.89 Vitamin B12    2. Anemia, unspecified type  D64.9 IBC + Ferritin    3. Hypothyroidism, unspecified type  E03.9 TSH    4. Type 2 diabetes mellitus without complication, without long-term current use of insulin  (HCC)  E11.9 Hemoglobin A1c    5. Dizziness  R42 MR BRAIN W WO CONTRAST    CANCELED: MR BRAIN W WO CONTRAST    6. Double vision  H53.2 MR BRAIN W WO CONTRAST    CANCELED: MR BRAIN W WO CONTRAST      No orders of the defined types were placed in this encounter.   Return  precautions advised.  Garnette Lukes, MD

## 2023-11-15 ENCOUNTER — Other Ambulatory Visit: Payer: Self-pay | Admitting: Family Medicine

## 2023-11-15 ENCOUNTER — Ambulatory Visit (HOSPITAL_COMMUNITY)
Admission: RE | Admit: 2023-11-15 | Discharge: 2023-11-15 | Disposition: A | Discharge status: Home / Self Care | Source: Ambulatory Visit | Attending: Family Medicine | Admitting: Family Medicine

## 2023-11-15 DIAGNOSIS — H532 Diplopia: Secondary | ICD-10-CM | POA: Diagnosis not present

## 2023-11-15 DIAGNOSIS — R42 Dizziness and giddiness: Secondary | ICD-10-CM | POA: Diagnosis not present

## 2023-11-15 DIAGNOSIS — Z0389 Encounter for observation for other suspected diseases and conditions ruled out: Secondary | ICD-10-CM | POA: Diagnosis not present

## 2023-11-15 MED ORDER — GADOBUTROL 1 MMOL/ML IV SOLN
5.0000 mL | Freq: Once | INTRAVENOUS | Status: AC | PRN
  Administered 2023-11-15: 5 mL via INTRAVENOUS

## 2023-11-21 DIAGNOSIS — H25813 Combined forms of age-related cataract, bilateral: Secondary | ICD-10-CM | POA: Diagnosis not present

## 2023-11-21 DIAGNOSIS — E119 Type 2 diabetes mellitus without complications: Secondary | ICD-10-CM | POA: Diagnosis not present

## 2023-11-21 DIAGNOSIS — H52203 Unspecified astigmatism, bilateral: Secondary | ICD-10-CM | POA: Diagnosis not present

## 2023-11-21 DIAGNOSIS — H532 Diplopia: Secondary | ICD-10-CM | POA: Diagnosis not present

## 2023-11-21 DIAGNOSIS — H5203 Hypermetropia, bilateral: Secondary | ICD-10-CM | POA: Diagnosis not present

## 2023-11-21 LAB — OPHTHALMOLOGY REPORT-SCANNED

## 2023-12-01 ENCOUNTER — Telehealth: Payer: Self-pay | Admitting: Cardiology

## 2023-12-01 NOTE — Telephone Encounter (Signed)
 Patient says she had an MRI and it showed small vessel disease. She has concerns she would like to address with Dr. Pietro if possible.

## 2023-12-01 NOTE — Telephone Encounter (Signed)
Left message for patient with Dr Creshaw's recommendations.   

## 2023-12-01 NOTE — Telephone Encounter (Signed)
 Patient had MRI on 11/15/23 showing small vessel disease and she is requesting to schedule an appt with either Dr. Pietro or Lum Louis, NP to discuss.  Patient scheduled to see [place] on 01/17/24 at 8:00 AM. Will forward message to both providers to review.

## 2023-12-17 ENCOUNTER — Other Ambulatory Visit: Payer: Self-pay | Admitting: Family Medicine

## 2024-01-02 ENCOUNTER — Other Ambulatory Visit: Payer: Self-pay | Admitting: Family Medicine

## 2024-01-16 ENCOUNTER — Ambulatory Visit: Admitting: Family Medicine

## 2024-01-16 ENCOUNTER — Encounter: Payer: Self-pay | Admitting: Family Medicine

## 2024-01-16 ENCOUNTER — Ambulatory Visit: Payer: Self-pay | Admitting: Family Medicine

## 2024-01-16 VITALS — BP 122/60 | HR 72 | Temp 97.6°F | Ht 61.0 in | Wt 112.6 lb

## 2024-01-16 DIAGNOSIS — E119 Type 2 diabetes mellitus without complications: Secondary | ICD-10-CM

## 2024-01-16 DIAGNOSIS — E785 Hyperlipidemia, unspecified: Secondary | ICD-10-CM

## 2024-01-16 DIAGNOSIS — I10 Essential (primary) hypertension: Secondary | ICD-10-CM

## 2024-01-16 DIAGNOSIS — R300 Dysuria: Secondary | ICD-10-CM

## 2024-01-16 DIAGNOSIS — I2583 Coronary atherosclerosis due to lipid rich plaque: Secondary | ICD-10-CM

## 2024-01-16 DIAGNOSIS — R1032 Left lower quadrant pain: Secondary | ICD-10-CM

## 2024-01-16 DIAGNOSIS — I2510 Atherosclerotic heart disease of native coronary artery without angina pectoris: Secondary | ICD-10-CM | POA: Diagnosis not present

## 2024-01-16 DIAGNOSIS — M810 Age-related osteoporosis without current pathological fracture: Secondary | ICD-10-CM

## 2024-01-16 DIAGNOSIS — E039 Hypothyroidism, unspecified: Secondary | ICD-10-CM | POA: Diagnosis not present

## 2024-01-16 LAB — C-REACTIVE PROTEIN: CRP: 1 mg/dL (ref 1.0–20.0)

## 2024-01-16 LAB — CBC WITH DIFFERENTIAL/PLATELET
Basophils Absolute: 0 K/uL (ref 0.0–0.1)
Basophils Relative: 0.5 % (ref 0.0–3.0)
Eosinophils Absolute: 0 K/uL (ref 0.0–0.7)
Eosinophils Relative: 0.4 % (ref 0.0–5.0)
HCT: 40.7 % (ref 36.0–46.0)
Hemoglobin: 13.7 g/dL (ref 12.0–15.0)
Lymphocytes Relative: 15.3 % (ref 12.0–46.0)
Lymphs Abs: 1.1 K/uL (ref 0.7–4.0)
MCHC: 33.7 g/dL (ref 30.0–36.0)
MCV: 93.8 fl (ref 78.0–100.0)
Monocytes Absolute: 0.4 K/uL (ref 0.1–1.0)
Monocytes Relative: 5.7 % (ref 3.0–12.0)
Neutro Abs: 5.6 K/uL (ref 1.4–7.7)
Neutrophils Relative %: 78.1 % — ABNORMAL HIGH (ref 43.0–77.0)
Platelets: 312 K/uL (ref 150.0–400.0)
RBC: 4.33 Mil/uL (ref 3.87–5.11)
RDW: 13.1 % (ref 11.5–15.5)
WBC: 7.2 K/uL (ref 4.0–10.5)

## 2024-01-16 LAB — COMPREHENSIVE METABOLIC PANEL WITH GFR
ALT: 18 U/L (ref 3–35)
AST: 25 U/L (ref 5–37)
Albumin: 4.3 g/dL (ref 3.5–5.2)
Alkaline Phosphatase: 73 U/L (ref 39–117)
BUN: 13 mg/dL (ref 6–23)
CO2: 27 meq/L (ref 19–32)
Calcium: 9.5 mg/dL (ref 8.4–10.5)
Chloride: 102 meq/L (ref 96–112)
Creatinine, Ser: 0.78 mg/dL (ref 0.40–1.20)
GFR: 71.53 mL/min
Glucose, Bld: 87 mg/dL (ref 70–99)
Potassium: 4.4 meq/L (ref 3.5–5.1)
Sodium: 138 meq/L (ref 135–145)
Total Bilirubin: 0.5 mg/dL (ref 0.2–1.2)
Total Protein: 7.1 g/dL (ref 6.0–8.3)

## 2024-01-16 LAB — URINALYSIS, ROUTINE W REFLEX MICROSCOPIC
Bilirubin Urine: NEGATIVE
Hgb urine dipstick: NEGATIVE
Ketones, ur: NEGATIVE
Nitrite: NEGATIVE
RBC / HPF: NONE SEEN
Specific Gravity, Urine: <=1.005 — AB (ref 1.000–1.030)
Total Protein, Urine: NEGATIVE
Urine Glucose: NEGATIVE
Urobilinogen, UA: 0.2 (ref 0.0–1.0)
pH: 6.5 (ref 5.0–8.0)

## 2024-01-16 MED ORDER — CEPHALEXIN 500 MG PO CAPS: 500.0000 mg | ORAL_CAPSULE | Freq: Three times a day (TID) | ORAL | 0 refills | Status: AC

## 2024-01-16 NOTE — Progress Notes (Signed)
 " Phone [phone] In person visit   Subjective:   Belinda Day is a 78 y.o. year old very pleasant female patient who presents for/with See problem oriented charting Chief Complaint  Patient presents with   Medical Management of Chronic Issues    6 month follow up; would like to have kidney function rechecked;    Dysuria    Pt is having menstrual like cramps and pelvic pressure, would like a urine culture    Past Medical History-  Patient Active Problem List   Diagnosis Date Noted   Type 2 diabetes mellitus without complication, without long-term current use of insulin  (HCC) 09/07/2017    Priority: High   Osteoporosis 08/14/2011    Priority: High   CAD (coronary artery disease) s/p CABG 07/03/2007    Priority: High   Aortic atherosclerosis 05/28/2020    Priority: Medium    Insomnia 02/12/2013    Priority: Medium    Nocturnal hypoxemia 12/12/2012    Priority: Medium    Fibromyalgia 12/28/2011    Priority: Medium    Hypothyroidism 04/06/2010    Priority: Medium    MAI (mycobacterium avium-intracellulare) (HCC) 11/19/2009    Priority: Medium    Hyperlipemia 07/03/2007    Priority: Medium    Essential hypertension 07/03/2007    Priority: Medium    Obstructive bronchiectasis (HCC) with GOLD II/III criteria 07/03/2007    Priority: Medium    Cystitis 11/05/2016    Priority: Low   Former smoker 08/05/2014    Priority: Low   Constipation 05/02/2014    Priority: Low   History of colonic polyps 05/02/2014    Priority: Low   Hemoptysis 02/24/2014    Priority: Low   Benign paroxysmal positional vertigo 04/18/2013    Priority: Low   Diverticulitis 12/21/2012    Priority: Low   sepsis secondary to Pyelonephritis 03/26/2023   Sepsis (HCC) 03/26/2023   Hyponatremia 03/26/2023   Medication management 10/21/2022   Cellulitis of left lower extremity 10/13/2022   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 1 each 2   alendronate  (FOSAMAX ) 70 MG tablet TAKE 1 TABLET BY MOUTH EVERY 7 DAYS WITH A FULL GLASS OF WATER ON AN EMPTY STOMACH 12 tablet 3   ALPRAZolam  (XANAX ) 0.25 MG tablet TAKE 1 TABLET BY MOUTH AT BEDTIME AS NEEDED FOR SLEEP. TAKE SPARINGLY FOR DAYTIME ANXIETY. 8 HOURS BETWEEN DOSES. DO NOT DRIVE FOR 8 HOURS 95 tablet 1   amitriptyline  (ELAVIL ) 50 MG tablet TAKE 1 TABLET(50 MG) BY MOUTH AT BEDTIME 90 tablet 3   amLODipine  (NORVASC ) 5 MG tablet TAKE 1 TABLET(5 MG) BY MOUTH DAILY 90 tablet 3   aspirin  EC 81 MG tablet Take 81 mg by mouth daily.     atorvastatin  (LIPITOR ) 80 MG tablet TAKE 1 TABLET(80 MG) BY MOUTH DAILY 90 tablet 3   azithromycin  (ZITHROMAX ) 250 MG tablet Take 1 tablet (250 mg total) by mouth daily. 30 each 11   calcium  carbonate (OS-CAL - DOSED IN MG OF ELEMENTAL CALCIUM ) 1250 (500 Ca) MG tablet Take 1 tablet by mouth daily.     cholecalciferol (VITAMIN D3) 25  MCG (1000 UNIT) tablet Take 1,000 Units by mouth daily.     Coenzyme Q10 (COQ10) 200 MG CAPS Take 200 mg by mouth daily.     cyanocobalamin  (VITAMIN B12) 1000 MCG tablet Take 1,000 mcg by mouth daily.     Melatonin 5 MG TABS Take 5 mg by mouth at bedtime.     metFORMIN  (GLUCOPHAGE ) 500 MG tablet TAKE 1 TABLET(500 MG) BY MOUTH TWICE DAILY WITH A MEAL 180 tablet 3   mometasone -formoterol  (DULERA ) 200-5 MCG/ACT AERO Take 2 puffs first thing in am and then another 2 puffs about 12 hours later. 1 each 11   Multiple Vitamin (MULTIVITAMIN) tablet Take 1 tablet by mouth daily.     nebivolol  (BYSTOLIC ) 2.5 MG tablet TAKE 1 TABLET(2.5 MG) BY MOUTH DAILY 90 tablet 2   Respiratory Therapy  Supplies (FLUTTER) DEVI Use as directed 1 each 0   SYNTHROID  75 MCG tablet TAKE 1 TABLET BY MOUTH DAILY BEFORE BREAKFAST 90 tablet 3   telmisartan  (MICARDIS ) 80 MG tablet TAKE 1 TABLET(80 MG) BY MOUTH DAILY 90 tablet 1   No current facility-administered medications for this visit.     Objective:  BP 122/60 (BP Location: Left Arm, Patient Position: Sitting, Cuff Size: Normal)   Pulse 72   Temp 97.6 F (36.4 C) (Temporal)   Ht 5' 1 (1.549 m)   Wt 112 lb 9.6 oz (51.1 kg)   SpO2 96%   BMI 21.28 kg/m  Gen: NAD, resting comfortably CV: RRR no murmurs rubs or gallops Lungs: diffuse wheeze per baseline Abdomen: soft/nontender except mild suprapubic and moderate in LLQ rather pinpoint/nondistended/normal bowel sounds. No rebound or guarding.  Ext: no edema Skin: warm, dry     Assessment and Plan    #Concern for UTI S: Patients symptoms started on saturday.  Complains of dysuria: slightly; polyuria: yes but drinking more; nocturia: no worsening; urgency: some. Suprapubic abdominal pain and some off to the left. Pain 5/10 max.   Symptoms are worsening.  ROS- no fever but some warmth, chills, nausea, vomiting, flank pain. No blood in urine.   A/P: UA will be obtained but on symptom(s)  Likely UTI. Will get culture. Empiric treatment with: keflex  Patient to follow up if new or worsening symptoms or failure to improve on antibiotic(s)  -has had diverticulitis and required in 2022 prolonged course augmentin  20 days and then cefuroxime and metronidazole . She pretty tender LLQ so diverticulitis possible- adding CRP and may add augmentin   #blurry vision, double vision- a week of issues last visit. Had seen Emergency Department and had EKG, CT angiogram, CBC, CMP- we added MRI largely reasuring. Considered neurology. Had considered reducing amitriptyline .  -worked with optho and they doubled the prism  and situation much improved. She has some imbalance but nothing like she had before.    #Fibromyalgia/insomnia S: Compliant with amitriptyline  50 mg (had worsening symptoms on lower dose in past by prior PCP)-due to age, her pharmacist has mentioned coming off amitriptyline  but she has been on since 1988 and prefers to continue.  Uses Tylenol  as needed. -Uses Xanax  to help with sleep.  Very difficult to fall asleep if she does not take this.  Fibromyalgia symptoms worsen with poor sleep A/P: overall stable- continue current medications    % Pulmonary-MAI and obstructive bronchiectasis-follows with Dr. Darlean S: Patient follows with pulmonary clinic.  On regular Dulera .  Albuterol  as needed.  Uses azithromycin  daily A/P: overall stable- continue pulmonary follow up    % #CAD status post  CABG-follows with Dr. Chari S: Compliant with atorvastatin  80 mg and aspirin  81 mg.  LDL goal under 70 -has cardiology visit tomorrow -no chest pain or shortness of breath  Lab Results  Component Value Date   CHOL 115 07/19/2023   HDL 60.50 07/19/2023   LDLCALC 44 07/19/2023   LDLDIRECT 51.0 11/28/2020   TRIG 56.0 07/19/2023   CHOLHDL 2 07/19/2023  A/P: coronary artery disease asymptomatic continue current medications . Lipids at goal and not due for repeat yet    #Hypertension S: Compliant with Bystolic  2.5 mg, telmisartan  80 mg, amlodipine  5 mg .[id number] A/P: stable- continue current medicines    #Hypothyroidism S: Compliant with Synthroid  75 mcg  Lab Results  Component Value Date   TSH 1.08 11/14/2023  A/P: stable- continue current medicines    #Diabetes-new diagnosis 12/02/2021 with A1c 6.6 already on metformin  S: Medication:  Metformin  500 mg twice a day. A/P: just checked and a1c  at goal in October- continue current medications     #Osteoporosis S: Takes calcium  and vitamin D  alone and all areas were stable-02/16/17 DEXA. -2.2 at R femur neck-23% 10 year risk and hip fracture risk 12%..  Numbers improved by 0.1 on femur necks in 2021 -2024 worse with to  score in femoral necks up to -2.9 and start fosamax  01/06/23 -Pepcid  for reflux A/P: doing ok lately - not yet due for bone density- will likely update next yera   #Constipation- recently doing ok on milk of magnesium .  Recommended follow up: Return for next already scheduled visit or sooner if needed. Future Appointments  Date Time Provider Department Center  01/17/2024  8:00 AM Rana Lum CROME, NP [facility]  04/02/2024 11:00 AM Darlean Ozell NOVAK, MD [facility] [id number]  07/20/2024  8:20 AM Katrinka Garnette KIDD, MD [facility]    Lab/Order associations:   ICD-10-CM   1. Dysuria  R30.0     2. Type 2 diabetes mellitus without complication, without long-term current use of insulin  (HCC)  E11.9     3. Hypothyroidism, unspecified type  E03.9     4. Age-related osteoporosis without current pathological fracture  M81.0     5. Coronary artery disease due to lipid rich plaque  I25.10    I25.83     6. Hyperlipidemia, unspecified hyperlipidemia type  E78.5     7. Essential hypertension  I10       No orders of the defined types were placed in this encounter.   Return precautions advised.  Garnette Katrinka, MD  "

## 2024-01-16 NOTE — Patient Instructions (Addendum)
 UA will be obtained but on symptom(s)  Likely UTI. Will get culture. Empiric treatment with: keflex  Patient to follow up if new or worsening symptoms or failure to improve on antibiotic(s)  -has had diverticulitis and required in 2022 prolonged course augmentin  20 days and then cefuroxime and metronidazole . She pretty tender LLQ so diverticulitis possible- adding CRP and may add augmentin   Please stop by lab before you go If you have mychart- we will send your results within 3 business days of us  receiving them.  If you do not have mychart- we will call you about results within 5 business days of us  receiving them.  *please also note that you will see labs on mychart as soon as they post. I will later go in and write notes on them- will say notes from Dr. Katrinka   Recommended follow up: Return for next already scheduled visit or sooner if needed.

## 2024-01-17 ENCOUNTER — Ambulatory Visit: Attending: Emergency Medicine | Admitting: Emergency Medicine

## 2024-01-17 ENCOUNTER — Encounter: Payer: Self-pay | Admitting: Emergency Medicine

## 2024-01-17 VITALS — BP 112/56 | HR 71 | Ht 61.0 in | Wt 111.0 lb

## 2024-01-17 DIAGNOSIS — I6523 Occlusion and stenosis of bilateral carotid arteries: Secondary | ICD-10-CM

## 2024-01-17 DIAGNOSIS — I2510 Atherosclerotic heart disease of native coronary artery without angina pectoris: Secondary | ICD-10-CM

## 2024-01-17 DIAGNOSIS — E785 Hyperlipidemia, unspecified: Secondary | ICD-10-CM | POA: Diagnosis not present

## 2024-01-17 DIAGNOSIS — J479 Bronchiectasis, uncomplicated: Secondary | ICD-10-CM

## 2024-01-17 DIAGNOSIS — I10 Essential (primary) hypertension: Secondary | ICD-10-CM

## 2024-01-17 LAB — URINE CULTURE
MICRO NUMBER:: 17386073
Result:: NO GROWTH
SPECIMEN QUALITY:: ADEQUATE

## 2024-01-17 NOTE — Patient Instructions (Signed)
 Medication Instructions:  NO CHANGES  Lab Work: NONE TO BE DONE TODAY.  Testing/Procedures: NONE  Follow-Up: At [facility], you and your health needs are our priority.  As part of our continuing mission to provide you with exceptional heart care, our providers are all part of one team.  This team includes your primary Cardiologist (physician) and Advanced Practice Providers or APPs (Physician Assistants and Nurse Practitioners) who all work together to provide you with the care you need, when you need it.  Your next appointment:   6 MONTHS  Provider:   Redell Shallow, MD OR Lum Louis, NP

## 2024-01-17 NOTE — Progress Notes (Signed)
 " Cardiology Office Note:    Date:  01/17/2024  ID:  Belinda Day, DOB [birth year], MRN [id number] PCP: Katrinka Garnette KIDD, MD  [facility] Providers Cardiologist:  Redell Shallow, MD Cardiology APP:  Rana Lum CROME, NP       Patient Profile:       Chief Complaint: ED follow-up History of Present Illness:  Belinda Day is a 78 y.o. female with visit-pertinent history of coronary artery disease s/p CABG in 2002, hypertension, prediabetes, bronchiectasis, dyslipidemia, COPD, T2DM, GERD, hypothyroidism   She has history of coronary disease status post coronary artery bypass grafting 2002.  Carotid Dopplers December 2015 showed no significant obstruction.  Echocardiogram March 2017 showed normal LV function and trace aortic insufficiency.  CTA March 2017 showed no pulmonary embolus.  There was bronchiectasis ectasis noted.  Nuclear study April 2017 showed ejection fraction 64% and no ischemia or infarction.   She was last seen by Dr. Shallow in January 2022, she was doing well with stable dyspnea secondary to her lung disease.   She was last seen in clinic on 03/10/2022.  She was doing well at the time with no medication changes.  She was to follow-up in 1 year.   She was admitted from 03/26/2023 through 03/29/2023 for sepsis secondary to pyelonephritis.  She was discharged home in stable condition.  Last seen in clinic on 04/05/2023.  She was doing well without anginal symptoms.  She did have acute onset lower extremity edema during last admission and had entirely resolved.  She was stable and the follow-up in 1 year.  Was seen in the ED on 11/09/2023 with complaints of dizziness and blurred vision.  Underwent CT angio of the head and neck showing no evidence of acute intracranial abnormality and mild chronic small vessel ischemic changes in the cerebral white matter.  Mild atherosclerosis in the head and neck without large vessel occlusion or significant proximal stenosis.  She  followed up with her ophthalmologist and they doubled the prism  in her glasses which improved her vision.  She did undergo MRI of the brain showing no acute intracranial abnormality and mostly small vessel disease.    Discussed the use of AI scribe software for clinical note transcription with the patient, who gave verbal consent to proceed.  History of Present Illness Belinda Day is an 78 year old female with cerebral small vessel disease and coronary artery disease who presents for cardiology follow-up after issues with vision, imbalance, and lightheadedness.  Today she presents without acute cardiovascular concerns.  She reports prior history of balance issues, dizziness, and vision issues.  Brain CT and MRI showed cerebral small vessel disease without acute infarct. Vision changes has improved with new glasses after being seen by ophthalmology.  She reports no longer experiencing dizziness or imbalance issues after changes to her glasses.  She is currently without chest pains or dyspnea.  She remains active and independent without exertional symptoms.  She was diagnosed with diabetes in 2023 and takes metformin  twice daily. She follows a vegetarian diet and her weight is stable.  She denies orthopnea, PND, palpitations, syncope, presyncope    Review of systems:  Please see the history of present illness. All other systems are reviewed and otherwise negative.      Studies Reviewed:    EKG Interpretation Date/Time:  Tuesday January 17 2024 08:07:27 EST Ventricular Rate:  71 PR Interval:  118 QRS Duration:  80 QT Interval:  370 QTC Calculation: 402 R Axis:  53  Text Interpretation: Normal sinus rhythm Nonspecific ST and T wave abnormality When compared with ECG of 09-Nov-2023 16:46, PREVIOUS ECG IS PRESENT Confirmed by Rana Dixon [phone]) on 01/17/2024 12:15:02 PM   Lexiscan  stress test 05/08/2015 The left ventricular ejection fraction is normal (55-65%). Nuclear  stress EF: 64%. There was no ST segment deviation noted during stress. The study is normal. no evidence of ischemia or infarction This is a low risk study.   Echocardiogram 04/18/2015 - Left ventricle: The cavity size was normal. Wall thickness was    normal. Systolic function was normal. The estimated ejection    fraction was in the range of 60% to 65%. Wall motion was normal;    there were no regional wall motion abnormalities. Left    ventricular diastolic function parameters were normal.  - Aortic valve: There was trivial regurgitation.  - Pulmonary arteries: Systolic pressure was mildly increased. PA    peak pressure: 31 mm Hg (S).   Risk Assessment/Calculations:              Physical Exam:   VS:  BP (!) 112/56 (BP Location: Left Arm, Patient Position: Sitting, Cuff Size: Normal)   Pulse 71   Ht 5' 1 (1.549 m)   Wt 111 lb (50.3 kg)   BMI 20.97 kg/m    Wt Readings from Last 3 Encounters:  01/17/24 111 lb (50.3 kg)  01/16/24 112 lb 9.6 oz (51.1 kg)  11/14/23 113 lb (51.3 kg)    GEN: Well nourished, well developed in no acute distress NECK: No JVD; No carotid bruits CARDIAC: RRR, no murmurs, rubs, gallops RESPIRATORY:  Clear to auscultation without rales, wheezing or rhonchi  ABDOMEN: Soft, non-tender, non-distended EXTREMITIES:  No edema; No acute deformity      Assessment and Plan:  Coronary artery disease H/o CABG in 2002 Lexiscan  stress test 04/2015 was without ischemia or infarction - Today she is stable without chest pains or dyspnea.  Remains fairly active without exertional symptoms.  No symptoms to suggest active angina.  No indication for ischemic evaluation at this time - EKG today without acute ST/T wave changes - Continue aspirin  81 mg daily, atorvastatin  80 mg daily, nebivolol  2.5 mg daily   Hypertension Blood pressure today is 112/56 and under excellent control - Continue amlodipine  5 mg daily, nebivolol  2.5 mg daily, telmisartan  80 mg daily    Bronchiectasis Chronic condition with intermittent dyspnea - Overall stable without acute exacerbation - Managed by pulmonology   Hyperlipidemia, LDL goal <70 LDL 44 on 06/2023 and well-controlled - Continue atorvastatin  80 mg daily  Carotid artery disease CTA 10/2023 with small to moderate amount of calcified plaque at the carotid bifurcation but no evidence of significant stenosis or dissection - No further interventions warranted at this time.   - Continue aspirin  81 mg daily and atorvastatin  80 mg daily  T2DM A1c 6.4% in 10/2023 and well-controlled - Managed on metformin  per PCP  Blurry vision/double vision Dizziness Had a reassuring CT angiogram and MRI.  No evidence of significant stenosis in the carotid system.  Found to have small vessel disease on MRI without acute abnormality - Today she is without stroke/TIA symptoms.  Vision issues have resolved after working with ophthalmology for new glasses.  No longer experiencing lightheadedness or imbalance.  No further cardiology workup at this time - Continue aspirin  and atorvastatin .  Continue to follow-up with PCP      Dispo:  Return in about 6 months (around 07/17/2024).  Signed, [employer]  LITTIE Louis, NP  "

## 2024-01-18 MED ORDER — AMOXICILLIN-POT CLAVULANATE 875-125 MG PO TABS: 1.0000 | ORAL_TABLET | Freq: Two times a day (BID) | ORAL | 0 refills | Status: AC

## 2024-01-18 NOTE — Telephone Encounter (Signed)
 I reached out to patient by phone and notified patient of PCP recommendations, pt verbalized understanding.

## 2024-04-02 ENCOUNTER — Ambulatory Visit: Admitting: Internal Medicine

## 2024-07-20 ENCOUNTER — Encounter: Admitting: Family Medicine

## 2024-08-08 ENCOUNTER — Encounter: Admitting: Family Medicine

## 2024-08-21 ENCOUNTER — Encounter: Admitting: Family Medicine
